# Patient Record
Sex: Female | Born: 1940 | Race: White | Hispanic: No | Marital: Single | Smoking: Never smoker
Health system: Southern US, Community
[De-identification: ages and names within clinical notes are randomized; demographics above are authoritative.]

## PROBLEM LIST (undated history)

## (undated) DIAGNOSIS — I10 Essential (primary) hypertension: Secondary | ICD-10-CM

## (undated) DIAGNOSIS — N059 Unspecified nephritic syndrome with unspecified morphologic changes: Secondary | ICD-10-CM

## (undated) DIAGNOSIS — F32A Depression, unspecified: Secondary | ICD-10-CM

## (undated) DIAGNOSIS — K317 Polyp of stomach and duodenum: Secondary | ICD-10-CM

## (undated) DIAGNOSIS — N186 End stage renal disease: Secondary | ICD-10-CM

## (undated) DIAGNOSIS — R0600 Dyspnea, unspecified: Secondary | ICD-10-CM

## (undated) DIAGNOSIS — N184 Chronic kidney disease, stage 4 (severe): Secondary | ICD-10-CM

## (undated) DIAGNOSIS — M109 Gout, unspecified: Secondary | ICD-10-CM

## (undated) DIAGNOSIS — E782 Mixed hyperlipidemia: Secondary | ICD-10-CM

## (undated) DIAGNOSIS — S300XXA Contusion of lower back and pelvis, initial encounter: Secondary | ICD-10-CM

## (undated) DIAGNOSIS — K219 Gastro-esophageal reflux disease without esophagitis: Secondary | ICD-10-CM

## (undated) DIAGNOSIS — Z992 Dependence on renal dialysis: Secondary | ICD-10-CM

## (undated) DIAGNOSIS — D631 Anemia in chronic kidney disease: Secondary | ICD-10-CM

## (undated) DIAGNOSIS — F329 Major depressive disorder, single episode, unspecified: Secondary | ICD-10-CM

## (undated) DIAGNOSIS — M479 Spondylosis, unspecified: Secondary | ICD-10-CM

## (undated) DIAGNOSIS — K3184 Gastroparesis: Secondary | ICD-10-CM

## (undated) DIAGNOSIS — N289 Disorder of kidney and ureter, unspecified: Secondary | ICD-10-CM

## (undated) DIAGNOSIS — I509 Heart failure, unspecified: Secondary | ICD-10-CM

## (undated) HISTORY — DX: Chronic kidney disease, stage 4 (severe): N18.4

## (undated) HISTORY — PX: CATARACT EXTRACTION: SUR2

## (undated) HISTORY — DX: Unspecified nephritic syndrome with unspecified morphologic changes: N05.9

## (undated) HISTORY — PX: TUBAL LIGATION: SHX77

## (undated) HISTORY — PX: APPENDECTOMY: SHX54

## (undated) HISTORY — DX: Depression, unspecified: F32.A

## (undated) HISTORY — DX: Essential (primary) hypertension: I10

## (undated) HISTORY — DX: Anemia in chronic kidney disease: D63.1

## (undated) HISTORY — DX: Gastro-esophageal reflux disease without esophagitis: K21.9

## (undated) HISTORY — DX: Mixed hyperlipidemia: E78.2

## (undated) HISTORY — DX: Major depressive disorder, single episode, unspecified: F32.9

---

## 2008-01-14 ENCOUNTER — Encounter: Admission: RE | Admit: 2008-01-14 | Discharge: 2008-01-14 | Payer: Self-pay | Admitting: Obstetrics and Gynecology

## 2008-08-12 ENCOUNTER — Encounter: Admission: RE | Admit: 2008-08-12 | Discharge: 2008-08-12 | Payer: Self-pay | Admitting: Obstetrics and Gynecology

## 2009-03-03 ENCOUNTER — Encounter: Admission: RE | Admit: 2009-03-03 | Discharge: 2009-03-03 | Payer: Self-pay | Admitting: Obstetrics and Gynecology

## 2010-02-01 ENCOUNTER — Encounter: Admission: RE | Admit: 2010-02-01 | Discharge: 2010-02-01 | Payer: Self-pay | Admitting: Obstetrics and Gynecology

## 2011-09-19 DIAGNOSIS — I10 Essential (primary) hypertension: Secondary | ICD-10-CM | POA: Insufficient documentation

## 2011-09-19 DIAGNOSIS — N059 Unspecified nephritic syndrome with unspecified morphologic changes: Secondary | ICD-10-CM | POA: Insufficient documentation

## 2011-10-01 DIAGNOSIS — F32A Depression, unspecified: Secondary | ICD-10-CM | POA: Insufficient documentation

## 2011-10-01 DIAGNOSIS — E782 Mixed hyperlipidemia: Secondary | ICD-10-CM | POA: Insufficient documentation

## 2012-07-30 DIAGNOSIS — M109 Gout, unspecified: Secondary | ICD-10-CM | POA: Insufficient documentation

## 2014-01-11 ENCOUNTER — Telehealth: Payer: Self-pay | Admitting: *Deleted

## 2014-01-11 ENCOUNTER — Encounter (HOSPITAL_COMMUNITY): Payer: Self-pay | Admitting: Emergency Medicine

## 2014-01-11 ENCOUNTER — Emergency Department (HOSPITAL_COMMUNITY): Payer: Medicare Other

## 2014-01-11 ENCOUNTER — Emergency Department (HOSPITAL_COMMUNITY)
Admission: EM | Admit: 2014-01-11 | Discharge: 2014-01-11 | Disposition: A | Discharge status: Home / Self Care | Payer: Medicare Other | Attending: Emergency Medicine | Admitting: Emergency Medicine

## 2014-01-11 DIAGNOSIS — I10 Essential (primary) hypertension: Secondary | ICD-10-CM | POA: Insufficient documentation

## 2014-01-11 DIAGNOSIS — R5381 Other malaise: Secondary | ICD-10-CM | POA: Insufficient documentation

## 2014-01-11 DIAGNOSIS — R109 Unspecified abdominal pain: Secondary | ICD-10-CM | POA: Insufficient documentation

## 2014-01-11 DIAGNOSIS — M7989 Other specified soft tissue disorders: Secondary | ICD-10-CM | POA: Insufficient documentation

## 2014-01-11 DIAGNOSIS — R55 Syncope and collapse: Secondary | ICD-10-CM | POA: Insufficient documentation

## 2014-01-11 DIAGNOSIS — Z8639 Personal history of other endocrine, nutritional and metabolic disease: Secondary | ICD-10-CM | POA: Insufficient documentation

## 2014-01-11 DIAGNOSIS — R0602 Shortness of breath: Secondary | ICD-10-CM | POA: Insufficient documentation

## 2014-01-11 DIAGNOSIS — Z862 Personal history of diseases of the blood and blood-forming organs and certain disorders involving the immune mechanism: Secondary | ICD-10-CM | POA: Insufficient documentation

## 2014-01-11 DIAGNOSIS — N390 Urinary tract infection, site not specified: Secondary | ICD-10-CM | POA: Insufficient documentation

## 2014-01-11 DIAGNOSIS — R5383 Other fatigue: Secondary | ICD-10-CM

## 2014-01-11 DIAGNOSIS — Z79899 Other long term (current) drug therapy: Secondary | ICD-10-CM | POA: Insufficient documentation

## 2014-01-11 DIAGNOSIS — Z792 Long term (current) use of antibiotics: Secondary | ICD-10-CM | POA: Insufficient documentation

## 2014-01-11 HISTORY — DX: Gout, unspecified: M10.9

## 2014-01-11 LAB — HEPATIC FUNCTION PANEL
ALT: 21 U/L (ref 0–35)
AST: 25 U/L (ref 0–37)
Albumin: 3.7 g/dL (ref 3.5–5.2)
Alkaline Phosphatase: 66 U/L (ref 39–117)
Bilirubin, Direct: <0.2 mg/dL (ref 0.0–0.3)
Total Bilirubin: 0.3 mg/dL (ref 0.3–1.2)
Total Protein: 7.3 g/dL (ref 6.0–8.3)

## 2014-01-11 LAB — LIPASE, BLOOD: Lipase: 179 U/L — ABNORMAL HIGH (ref 11–59)

## 2014-01-11 LAB — BASIC METABOLIC PANEL
BUN: 43 mg/dL — ABNORMAL HIGH (ref 6–23)
CO2: 22 mEq/L (ref 19–32)
Calcium: 9.4 mg/dL (ref 8.4–10.5)
Chloride: 100 mEq/L (ref 96–112)
Creatinine, Ser: 1.86 mg/dL — ABNORMAL HIGH (ref 0.50–1.10)
GFR calc Af Amer: 30 mL/min — ABNORMAL LOW (ref >90)
GFR calc non Af Amer: 26 mL/min — ABNORMAL LOW (ref >90)
Glucose, Bld: 96 mg/dL (ref 70–99)
Potassium: 4.6 mEq/L (ref 3.7–5.3)
Sodium: 136 mEq/L — ABNORMAL LOW (ref 137–147)

## 2014-01-11 LAB — CBC
HCT: 33.8 % — ABNORMAL LOW (ref 36.0–46.0)
Hemoglobin: 11.6 g/dL — ABNORMAL LOW (ref 12.0–15.0)
MCH: 31.5 pg (ref 26.0–34.0)
MCHC: 34.3 g/dL (ref 30.0–36.0)
MCV: 91.8 fL (ref 78.0–100.0)
Platelets: 293 10*3/uL (ref 150–400)
RBC: 3.68 MIL/uL — ABNORMAL LOW (ref 3.87–5.11)
RDW: 13.6 % (ref 11.5–15.5)
WBC: 6.9 10*3/uL (ref 4.0–10.5)

## 2014-01-11 LAB — URINALYSIS, ROUTINE W REFLEX MICROSCOPIC
Bilirubin Urine: NEGATIVE
Glucose, UA: NEGATIVE mg/dL
Ketones, ur: NEGATIVE mg/dL
Nitrite: POSITIVE — AB
Protein, ur: NEGATIVE mg/dL
Specific Gravity, Urine: 1.01 (ref 1.005–1.030)
Urobilinogen, UA: 0.2 mg/dL (ref 0.0–1.0)
pH: 5.5 (ref 5.0–8.0)

## 2014-01-11 LAB — URINE MICROSCOPIC-ADD ON

## 2014-01-11 LAB — TROPONIN I: Troponin I: <0.3 ng/mL (ref <0.30)

## 2014-01-11 LAB — PRO B NATRIURETIC PEPTIDE: Pro B Natriuretic peptide (BNP): 55.8 pg/mL (ref 0–125)

## 2014-01-11 MED ORDER — CEPHALEXIN 500 MG PO CAPS: 500.0000 mg | ORAL_CAPSULE | Freq: Four times a day (QID) | ORAL | Status: DC

## 2014-01-11 MED ORDER — DEXTROSE 5 % IV SOLN
1.0000 g | Freq: Once | INTRAVENOUS | Status: AC
  Administered 2014-01-11: 1 g via INTRAVENOUS
  Filled 2014-01-11: qty 10

## 2014-01-11 MED ORDER — IOHEXOL 300 MG/ML  SOLN
50.0000 mL | Freq: Once | INTRAMUSCULAR | Status: AC | PRN
  Administered 2014-01-11: 50 mL via ORAL

## 2014-01-11 MED ORDER — SODIUM CHLORIDE 0.9 % IV BOLUS (SEPSIS)
250.0000 mL | Freq: Once | INTRAVENOUS | Status: AC
  Administered 2014-01-11: 250 mL via INTRAVENOUS

## 2014-01-11 MED ORDER — SODIUM CHLORIDE 0.9 % IV SOLN
INTRAVENOUS | Status: DC
  Administered 2014-01-11: 18:00:00 via INTRAVENOUS

## 2014-01-11 MED ORDER — ONDANSETRON HCL 4 MG/2ML IJ SOLN
4.0000 mg | Freq: Once | INTRAMUSCULAR | Status: AC
  Administered 2014-01-11: 4 mg via INTRAVENOUS
  Filled 2014-01-11: qty 2

## 2014-01-11 NOTE — Discharge Instructions (Signed)
As we discussed followup with your regular Dr. in the next few days,  about your blood pressure readings. Perhaps a 1 and make an adjustment. The only significant finding on her workup here today was a urinary tract infection. He also had an elevation in the lipase but CT scan without any abnormal findings around the pancreas. Also no significant Donald pain there is a do not think this is pancreatitis. Return for any new or worse symptoms. Take the antibiotic for the urinary tract infection as directed. Urine culture was done that we'll take a couple days to get an official read on. Would also talk to your Dr. about sure thyroid tests.

## 2014-01-11 NOTE — ED Notes (Addendum)
Pt reports weakness since mother's day. Pt reports bp had been running low and has been taken off metoprolol. Pt reports ever since has been feeling weak. Pt denies any n/v, pain. Pt reports dizziness and lightheaded episodes a few weeks ago.

## 2014-01-11 NOTE — Telephone Encounter (Signed)
Pt called stating her BP is dropping. BP is 99/54 today and yesterday was 100/43, pulse is running around 60-80.Pt is weak and tired and states she could not get out of bed yesterday. This nurse asked what her normal BP runs. Pt states since she has kidney problems it runs a lot higher, 130s or higher. This nurse made the pt aware to go to the ER following her symptoms. I am not quit sure what her medications are and we do not have any notes on pt yet due to not having an appt until July 7 in our office.

## 2014-01-11 NOTE — Telephone Encounter (Signed)
Ok. Would not be able to provide recommendations until I assess her in the office.

## 2014-01-11 NOTE — ED Provider Notes (Signed)
CSN: [id number]     Arrival date & time 01/11/14  1238 History   First MD Initiated Contact with Patient 01/11/14 1508     Chief Complaint  Patient presents with  . Weakness     (Consider location/radiation/quality/duration/timing/severity/associated sxs/prior Treatment) Patient is a 73 y.o. female presenting with weakness. The history is provided by the patient.  Weakness Associated symptoms include abdominal pain and shortness of breath. Pertinent negatives include no chest pain and no headaches.   outpatient with primary care Dr. also followed by nephrology at [facility]. Recent adjustments in her blood pressure medicines because her blood pressure has been running lower than normal. Recently stopped her beta blocker and changed to Coreg also 1 week ago was 2 weeks ago one week ago had her hydrochlorothiazide stopped. Speech is still having low blood pressures [id number] systolic feeling fatigued and weak tired all the time. Some shortness of breath no chest pain no fever or chills has had some lower extremity edema but none now. Had several near syncopal episodes but no syncope. Has some epigastric abdominal pain all day today. It is not severe pain. Patient did note maybe some mild dysuria today.  Past Medical History  Diagnosis Date  . Renal disorder   . Hypertension   . Gout    Past Surgical History  Procedure Laterality Date  . Tubal ligation    . Appendectomy     History reviewed. No pertinent family history. History  Substance Use Topics  . Smoking status: Never Smoker   . Smokeless tobacco: Not on file  . Alcohol Use: No   OB History   Grav Para Term Preterm Abortions TAB SAB Ect Mult Living                 Review of Systems  Constitutional: Positive for activity change, appetite change and fatigue.  HENT: Negative for congestion.   Eyes: Negative for visual disturbance.  Respiratory: Positive for shortness of breath. Negative for cough.   Cardiovascular: Positive for  leg swelling. Negative for chest pain.  Gastrointestinal: Positive for abdominal pain.  Genitourinary: Positive for dysuria.  Musculoskeletal: Negative for back pain.  Skin: Negative for rash.  Neurological: Negative for dizziness, speech difficulty, weakness, numbness and headaches.  Hematological: Does not bruise/bleed easily.  Psychiatric/Behavioral: Negative for confusion.      Allergies  Sulfa antibiotics  Home Medications   Prior to Admission medications   Medication Sig Start Date End Date Taking? Authorizing Provider  allopurinol (ZYLOPRIM) 300 MG tablet Take 300 mg by mouth daily.   Yes Historical Provider, MD  b complex vitamins tablet Take 1 tablet by mouth daily.   Yes Historical Provider, MD  carvedilol (COREG) 6.25 MG tablet Take 6.25 mg by mouth 2 (two) times daily with a meal.   Yes Historical Provider, MD  Cholecalciferol (VITAMIN D PO) Take 1 tablet by mouth daily.   Yes Historical Provider, MD  citalopram (CELEXA) 10 MG tablet Take 10 mg by mouth at bedtime.   Yes Historical Provider, MD  clidinium-chlordiazePOXIDE (LIBRAX) 5-2.5 MG per capsule Take 1 capsule by mouth 4 (four) times daily.   Yes Historical Provider, MD  ezetimibe (ZETIA) 10 MG tablet Take 10 mg by mouth daily.   Yes Historical Provider, MD  losartan (COZAAR) 100 MG tablet Take 100 mg by mouth daily.   Yes Historical Provider, MD  simvastatin (ZOCOR) 40 MG tablet Take 40 mg by mouth daily.   Yes Historical Provider, MD  cephALEXin (KEFLEX) 500  MG capsule Take 1 capsule (500 mg total) by mouth 4 (four) times daily. 01/11/14   Fredia Sorrow, MD   BP 118/61  Pulse 58  Temp(Src) 97.9 F (36.6 C) (Oral)  Resp 15  Ht 5' (1.524 m)  Wt 126 lb (57.153 kg)  BMI 24.61 kg/m2  SpO2 100% Physical Exam  Nursing note and vitals reviewed. Constitutional: She is oriented to person, place, and time. She appears well-developed and well-nourished. No distress.  HENT:  Head: Normocephalic.  Mouth/Throat:  Oropharynx is clear and moist.  Eyes: Conjunctivae and EOM are normal. Pupils are equal, round, and reactive to light.  Neck: Normal range of motion. Neck supple.  Cardiovascular: Normal rate, regular rhythm and normal heart sounds.   No murmur heard. Pulmonary/Chest: Effort normal and breath sounds normal. No respiratory distress. She exhibits no tenderness.  Abdominal: Soft. Bowel sounds are normal. There is no tenderness.  Musculoskeletal: Normal range of motion. She exhibits no edema.  Neurological: She is alert and oriented to person, place, and time. No cranial nerve deficit. She exhibits normal muscle tone. Coordination normal.  Skin: Skin is warm. No rash noted.    ED Course  Procedures (including critical care time) Labs Review Labs Reviewed  CBC - Abnormal; Notable for the following:    RBC 3.68 (*)    Hemoglobin 11.6 (*)    HCT 33.8 (*)    All other components within normal limits  BASIC METABOLIC PANEL - Abnormal; Notable for the following:    Sodium 136 (*)    BUN 43 (*)    Creatinine, Ser 1.86 (*)    GFR calc non Af Amer 26 (*)    GFR calc Af Amer 30 (*)    All other components within normal limits  LIPASE, BLOOD - Abnormal; Notable for the following:    Lipase 179 (*)    All other components within normal limits  URINALYSIS, ROUTINE W REFLEX MICROSCOPIC - Abnormal; Notable for the following:    Hgb urine dipstick MODERATE (*)    Nitrite POSITIVE (*)    Leukocytes, UA SMALL (*)    All other components within normal limits  URINE MICROSCOPIC-ADD ON - Abnormal; Notable for the following:    Squamous Epithelial / LPF FEW (*)    Bacteria, UA MANY (*)    All other components within normal limits  URINE CULTURE  HEPATIC FUNCTION PANEL  TROPONIN I  PRO B NATRIURETIC PEPTIDE   Results for orders placed during the hospital encounter of 01/11/14  CBC      Result Value Ref Range   WBC 6.9  4.0 - 10.5 K/uL   RBC 3.68 (*) 3.87 - 5.11 MIL/uL   Hemoglobin 11.6 (*)  12.0 - 15.0 g/dL   HCT 33.8 (*) 36.0 - 46.0 %   MCV 91.8  78.0 - 100.0 fL   MCH 31.5  26.0 - 34.0 pg   MCHC 34.3  30.0 - 36.0 g/dL   RDW 13.6  11.5 - 15.5 %   Platelets 293  150 - 400 K/uL  BASIC METABOLIC PANEL      Result Value Ref Range   Sodium 136 (*) 137 - 147 mEq/L   Potassium 4.6  3.7 - 5.3 mEq/L   Chloride 100  96 - 112 mEq/L   CO2 22  19 - 32 mEq/L   Glucose, Bld 96  70 - 99 mg/dL   BUN 43 (*) 6 - 23 mg/dL   Creatinine, Ser 1.86 (*) 0.50 -  1.10 mg/dL   Calcium 9.4  8.4 - 10.5 mg/dL   GFR calc non Af Amer 26 (*) >90 mL/min   GFR calc Af Amer 30 (*) >90 mL/min  LIPASE, BLOOD      Result Value Ref Range   Lipase 179 (*) 11 - 59 U/L  HEPATIC FUNCTION PANEL      Result Value Ref Range   Total Protein 7.3  6.0 - 8.3 g/dL   Albumin 3.7  3.5 - 5.2 g/dL   AST 25  0 - 37 U/L   ALT 21  0 - 35 U/L   Alkaline Phosphatase 66  39 - 117 U/L   Total Bilirubin 0.3  0.3 - 1.2 mg/dL   Bilirubin, Direct <0.2  0.0 - 0.3 mg/dL   Indirect Bilirubin NOT CALCULATED  0.3 - 0.9 mg/dL  URINALYSIS, ROUTINE W REFLEX MICROSCOPIC      Result Value Ref Range   Color, Urine YELLOW  YELLOW   APPearance CLEAR  CLEAR   Specific Gravity, Urine 1.010  1.005 - 1.030   pH 5.5  5.0 - 8.0   Glucose, UA NEGATIVE  NEGATIVE mg/dL   Hgb urine dipstick MODERATE (*) NEGATIVE   Bilirubin Urine NEGATIVE  NEGATIVE   Ketones, ur NEGATIVE  NEGATIVE mg/dL   Protein, ur NEGATIVE  NEGATIVE mg/dL   Urobilinogen, UA 0.2  0.0 - 1.0 mg/dL   Nitrite POSITIVE (*) NEGATIVE   Leukocytes, UA SMALL (*) NEGATIVE  TROPONIN I      Result Value Ref Range   Troponin I <0.30  <0.30 ng/mL  PRO B NATRIURETIC PEPTIDE      Result Value Ref Range   Pro B Natriuretic peptide (BNP) 55.8  0 - 125 pg/mL  URINE MICROSCOPIC-ADD ON      Result Value Ref Range   Squamous Epithelial / LPF FEW (*) RARE   WBC, UA 21-50  <3 WBC/hpf   RBC / HPF 0-2  <3 RBC/hpf   Bacteria, UA MANY (*) RARE     Imaging Review Ct Abdomen Pelvis Wo  Contrast  01/11/2014   CLINICAL DATA:  Weakness  EXAM: CT ABDOMEN AND PELVIS WITHOUT CONTRAST  TECHNIQUE: Multidetector CT imaging of the abdomen and pelvis was performed following the standard protocol without IV contrast.  COMPARISON:  None.  FINDINGS: Irregular pleural thickening noted at the bilateral lung bases. The visualized lung bases are otherwise clear.  Limited noncontrast evaluation liver is unremarkable. Gallbladder within normal limits. No biliary ductal dilatation. The spleen, adrenal glands, and pancreas demonstrate a normal unenhanced appearance.  The kidneys are equal in size without evidence of nephrolithiasis or hydronephrosis.  Stomach within normal limits. No evidence of bowel obstruction. No acute inflammatory changes seen about the bowels. Appendix not definite visualize, however, no inflammatory changes seen about the cecum or in the right lower quadrant to suggest acute appendicitis.  Bladder is unremarkable. Uterus and ovaries within normal limits for patient age.  No free air or fluid. Intra-abdominal aorta is of normal caliber. Scattered collateral veins noted within the subcutaneous fat of the lower pelvis anteriorly.  No acute osseous abnormality. No worrisome lytic or blastic osseous lesions.  IMPRESSION: No CT evidence of acute intra-abdominal pelvic process.   Electronically Signed   By: Jeannine Boga M.D.   On: 01/11/2014 20:00   Dg Chest 2 View  01/11/2014   CLINICAL DATA:  Weakness and fatigue.  EXAM: CHEST  2 VIEW  COMPARISON:  None.  FINDINGS: Mediastinum and hilar  structures normal. Lungs are clear of acute infiltrates. Subsegmental atelectasis and or scarring left lung base. Mild cardiomegaly with normal pulmonary vascularity. No acute bony abnormality.  IMPRESSION: Mild left base subsegmental atelectasis and/or scarring . No acute cardiopulmonary disease.   Electronically Signed   By: Marcello Moores  Register   On: 01/11/2014 16:46     EKG  Interpretation   Date/Time:  Monday January 11 2014 12:52:19 EDT Ventricular Rate:  64 PR Interval:  148 QRS Duration: 84 QT Interval:  440 QTC Calculation: 453 R Axis:   -34 Text Interpretation:  Normal sinus rhythm Left axis deviation Low voltage  QRS Cannot rule out Anterior infarct , age undetermined Abnormal ECG  Confirmed by Trayvon Trumbull  MD, Hser Belanger [phone]) on 01/11/2014 3:41:21 PM      MDM   Final diagnoses:  UTI (lower urinary tract infection)  Other fatigue  Near syncope    Patient with about two-week history of fatigue some near syncopal episodes but no syncope blood pressures been running on the low side but always above 90 occasionally 99 systolic or [id number] systolic. Patient's had adjustment in her blood pressure medicines over the last 3-4 weeks. And hydrochlorothiazide stopped her beta blocker stopped and started on Coreg. Patient's symptoms have not changed. Patient is followed by Dr. Melina Copa locally. 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  Patient's workup here without without any significant cardiac findings. Blood pressure here or of was stable and above [id number] systolic. As high as [id number] systolic. Patient's troponin negative no evidence of any acute cardiac event in the past several days. EKG without any acute findings. Chest x-ray negative for any acute cardiopulmonary disease. CT of the abdomen was negative and that was done due to the elevated lipase. No evidence of any abnormalities on the CAT scan no evidence of any mass around the pancreas. Patient's urinalysis was consistent with a urinary tract infection. Culture sent treated with Rocephin will be continued on Keflex. This was a significant finding not sure if that explains her symptoms for the past several weeks. Patient also did have a tick bite not sure whether they have been about a month ago. We'll treat the for the urinary tract infection of the patient followup with her primary care Dr. gave  her a log of her blood pressure readings they may want to make additional adjustments on her blood pressure. Also feel that they ought to verify the thyroid function is normal. That may have or even done.    Fredia Sorrow, MD 01/11/14 2117

## 2014-01-13 LAB — URINE CULTURE: Colony Count: >=100000

## 2014-01-14 ENCOUNTER — Telehealth (HOSPITAL_COMMUNITY): Payer: Self-pay

## 2014-01-14 NOTE — ED Notes (Signed)
Post ED Visit - Positive Culture Follow-up  Culture report reviewed by antimicrobial stewardship pharmacist: []  Wes [place], Pharm.D., BCPS []  Heide Guile, Pharm.D., BCPS []  Alycia Rossetti, Pharm.D., BCPS [x]  [place], Pharm.D., BCPS, AAHIVP []  Legrand Como, Pharm.D., BCPS, AAHIVP []  Juliene Pina, Pharm.D.  Positive urine culture Treated with cephalexin, organism sensitive to the same and no further patient follow-up is required at this time.  Felicia Williams 01/14/2014, 12:24 PM

## 2014-01-26 ENCOUNTER — Ambulatory Visit (INDEPENDENT_AMBULATORY_CARE_PROVIDER_SITE_OTHER): Payer: Medicare Other | Admitting: Cardiology

## 2014-01-26 ENCOUNTER — Encounter: Payer: Self-pay | Admitting: Cardiology

## 2014-01-26 VITALS — BP 138/62 | HR 58 | Ht 60.0 in | Wt 123.0 lb

## 2014-01-26 DIAGNOSIS — R5381 Other malaise: Secondary | ICD-10-CM

## 2014-01-26 DIAGNOSIS — R002 Palpitations: Secondary | ICD-10-CM | POA: Insufficient documentation

## 2014-01-26 DIAGNOSIS — F3289 Other specified depressive episodes: Secondary | ICD-10-CM

## 2014-01-26 DIAGNOSIS — F329 Major depressive disorder, single episode, unspecified: Secondary | ICD-10-CM

## 2014-01-26 DIAGNOSIS — F32A Depression, unspecified: Secondary | ICD-10-CM

## 2014-01-26 DIAGNOSIS — I10 Essential (primary) hypertension: Secondary | ICD-10-CM | POA: Insufficient documentation

## 2014-01-26 DIAGNOSIS — R5383 Other fatigue: Secondary | ICD-10-CM

## 2014-01-26 DIAGNOSIS — R0602 Shortness of breath: Secondary | ICD-10-CM

## 2014-01-26 NOTE — Assessment & Plan Note (Signed)
This has largely resolved. Suspect that it was related to discontinuation of metoprolol, now improved after initiation of Coreg. Recent ECG reviewed. At this point would recommend observation.

## 2014-01-26 NOTE — Progress Notes (Signed)
Clinical Summary [name] is a 73 y.o.female referred for cardiology consultation by Dr. Melina Copa. She has a history of hypertension and glomerulonephritis, followed by nephrology at [facility]. 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 Weeks later this had improved somewhat, however she stated that she was feeling very weak and tired, also feeling of anhedonia. She was seen by Dr. Melina Copa for these symptoms, was transitioned off of metoprolol without much improvement, actually began to feel palpitations with this, and was ultimately placed on Coreg. She was continued on Cozaar throughout, although hydrochlorothiazide was discontinued due to relatively low blood pressures which was unusual for her. She was diagnosed with a UTI with ER visit in June, although not clear that all these symptoms were related.   She does with that she still feels tired, however this week is starting to do better. 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 He states that she felt similar symptoms years ago when she was clinically depressed, and has in fact just recently been placed on Celexa by Dr. Melina Copa.  As far as prior cardiac evaluation, details are not clear, however the patient tells me that she she was seen years ago for "tachycardia." No clear history of a cardiomyopathy or arrhythmias. It does sound like she has been on beta blocker therapy long-term.  Recent lab work in June showed hemoglobin 11.6, platelets 293, potassium 4.6, BUN 43, creatinine 1.8, normal LFTs, normal troponin I, and normal pro-BNP. Urine culture was positive for Escherichia coli infection, urinalysis did not report any protein. ECG from June showed sinus rhythm with borderline low voltage and leftward axis.   Allergies  Allergen Reactions  . Sulfa  Antibiotics Other (See Comments)    Shut pt's kidneys down    Current Outpatient Prescriptions  Medication Sig Dispense Refill  . b complex vitamins tablet Take 1 tablet by mouth daily.      . carvedilol (COREG) 6.25 MG tablet Take 6.25 mg by mouth 2 (two) times daily with a meal.      . cephALEXin (KEFLEX) 500 MG capsule Take 1 capsule (500 mg total) by mouth 4 (four) times daily.  28 capsule  0  . Cholecalciferol (VITAMIN D PO) Take 1 tablet by mouth daily.      . citalopram (CELEXA) 10 MG tablet Take 10 mg by mouth at bedtime.      . clidinium-chlordiazePOXIDE (LIBRAX) 5-2.5 MG per capsule Take 1 capsule by mouth 4 (four) times daily.      Marland Kitchen losartan (COZAAR) 100 MG tablet Take 100 mg by mouth daily.       No current facility-administered medications for this visit.    Past Medical History  Diagnosis Date  . Essential hypertension, benign   . Gout   . Mixed hyperlipidemia   . Depression   . GERD (gastroesophageal reflux disease)   . Glomerulonephritis     Past Surgical History  Procedure Laterality Date  . Tubal ligation    . Appendectomy    . Cataract extraction      Family History  Problem Relation Age of Onset  . CAD Father   . Heart attack Father   . Diabetes Mellitus II Father   . Hypertension Father   . Lupus Brother     Social History Ms.  Deak reports that she has never smoked. She does not have any smokeless tobacco history on file. Ms. Mathes reports that she does not drink alcohol.  Review of Systems Palpitations and leg edema had improved. Starting to get somewhat better energy. Appetite is fair. No orthopnea or PND. No exertional chest pain. Other systems reviewed and negative except as outlined.  Physical Examination Filed Vitals:   01/26/14 1335  BP: 138/62  Pulse: 58   Filed Weights   01/26/14 1335  Weight: 123 lb (55.792 kg)   Normally nourished appearing woman, comfortable at rest.HEENT: Conjunctiva and lids normal, oropharynx  clear with moist mucosa. Neck: Supple, no elevated JVP or carotid bruits, no thyromegaly. Lungs: Clear to auscultation, nonlabored breathing at rest. Cardiac: Regular rate and rhythm, no S3 or significant systolic murmur, no pericardial rub. Abdomen: Soft, nontender, bowel sounds present. Extremities: No pitting edema, distal pulses 2+. Skin: Warm and dry. Musculoskeletal: No kyphosis. Neuropsychiatric: Alert and oriented x3, affect grossly appropriate.   Problem List and Plan   Palpitations This has largely resolved. Suspect that it was related to discontinuation of metoprolol, now improved after initiation of Coreg. Recent ECG reviewed. At this point would recommend observation.  Fatigue due to depression Possible diagnosis. Patient is recently on Celexa, starting to feel better. She does report a period of time when she had leg swelling and also relative shortness of breath with activity. Recent lab work is not suggestive of overt volume overload, blood pressure seems to be stabilizing as well. We will obtain an echocardiogram to formally assess cardiac structure and function. If no significant abnormalities are uncovered, no further cardiac workup anticipated at this point.  Essential hypertension, benign She is on reasonable medical regimen including Cozaar and Coreg. Agree with holding HCTZ with some low blood pressures noted and a bump in creatinine. She should keep close followup with Dr. Melina Copa and her nephrologist.    Satira Sark, M.D., F.A.C.C.

## 2014-01-26 NOTE — Patient Instructions (Signed)
Your physician recommends that you schedule a follow-up appointment in: to be determined after test    Your physician has requested that you have an echocardiogram. Echocardiography is a painless test that uses sound waves to create images of your heart. It provides your doctor with information about the size and shape of your heart and how well your heart's chambers and valves are working. This procedure takes approximately one hour. There are no restrictions for this procedure.    Your physician recommends that you continue on your current medications as directed. Please refer to the Current Medication list given to you today.    Thank you for choosing [facility] !

## 2014-01-26 NOTE — Assessment & Plan Note (Signed)
She is on reasonable medical regimen including Cozaar and Coreg. Agree with holding HCTZ with some low blood pressures noted and a bump in creatinine. She should keep close followup with Dr. Melina Copa and her nephrologist.

## 2014-01-26 NOTE — Assessment & Plan Note (Signed)
Possible diagnosis. Patient is recently on Celexa, starting to feel better. She does report a period of time when she had leg swelling and also relative shortness of breath with activity. Recent lab work is not suggestive of overt volume overload, blood pressure seems to be stabilizing as well. We will obtain an echocardiogram to formally assess cardiac structure and function. If no significant abnormalities are uncovered, no further cardiac workup anticipated at this point.

## 2014-01-29 ENCOUNTER — Ambulatory Visit (HOSPITAL_COMMUNITY)
Admission: RE | Admit: 2014-01-29 | Discharge: 2014-01-29 | Disposition: A | Discharge status: Home / Self Care | Payer: Medicare Other | Source: Ambulatory Visit | Attending: Cardiology | Admitting: Cardiology

## 2014-01-29 DIAGNOSIS — R0602 Shortness of breath: Secondary | ICD-10-CM

## 2014-01-29 DIAGNOSIS — R5383 Other fatigue: Secondary | ICD-10-CM

## 2014-01-29 DIAGNOSIS — I059 Rheumatic mitral valve disease, unspecified: Secondary | ICD-10-CM | POA: Insufficient documentation

## 2014-01-29 DIAGNOSIS — R609 Edema, unspecified: Secondary | ICD-10-CM | POA: Insufficient documentation

## 2014-01-29 DIAGNOSIS — R5381 Other malaise: Secondary | ICD-10-CM | POA: Insufficient documentation

## 2014-01-29 DIAGNOSIS — R002 Palpitations: Secondary | ICD-10-CM | POA: Insufficient documentation

## 2014-01-29 DIAGNOSIS — I10 Essential (primary) hypertension: Secondary | ICD-10-CM | POA: Insufficient documentation

## 2014-01-29 DIAGNOSIS — E785 Hyperlipidemia, unspecified: Secondary | ICD-10-CM | POA: Insufficient documentation

## 2014-01-29 NOTE — Progress Notes (Signed)
  Echocardiogram 2D Echocardiogram has been performed.  [place], [place] 01/29/2014, 3:06 PM

## 2015-07-13 ENCOUNTER — Other Ambulatory Visit (HOSPITAL_COMMUNITY): Payer: Self-pay | Admitting: *Deleted

## 2015-07-13 DIAGNOSIS — R1011 Right upper quadrant pain: Secondary | ICD-10-CM

## 2015-07-20 ENCOUNTER — Ambulatory Visit (HOSPITAL_COMMUNITY): Admission: RE | Admit: 2015-07-20 | Payer: Medicare HMO | Source: Ambulatory Visit

## 2015-07-22 ENCOUNTER — Ambulatory Visit (HOSPITAL_COMMUNITY)
Admission: RE | Admit: 2015-07-22 | Discharge: 2015-07-22 | Disposition: A | Discharge status: Home / Self Care | Payer: Medicare HMO | Source: Ambulatory Visit | Attending: *Deleted | Admitting: *Deleted

## 2015-07-22 DIAGNOSIS — R1011 Right upper quadrant pain: Secondary | ICD-10-CM

## 2015-09-06 ENCOUNTER — Encounter: Payer: Self-pay | Admitting: Cardiology

## 2015-09-06 ENCOUNTER — Ambulatory Visit (INDEPENDENT_AMBULATORY_CARE_PROVIDER_SITE_OTHER): Payer: Medicare Other | Admitting: Cardiology

## 2015-09-06 VITALS — BP 132/72 | HR 57 | Ht 60.0 in | Wt 123.0 lb

## 2015-09-06 DIAGNOSIS — R0602 Shortness of breath: Secondary | ICD-10-CM

## 2015-09-06 DIAGNOSIS — E782 Mixed hyperlipidemia: Secondary | ICD-10-CM

## 2015-09-06 DIAGNOSIS — I10 Essential (primary) hypertension: Secondary | ICD-10-CM | POA: Diagnosis not present

## 2015-09-06 DIAGNOSIS — R002 Palpitations: Secondary | ICD-10-CM | POA: Diagnosis not present

## 2015-09-06 DIAGNOSIS — R072 Precordial pain: Secondary | ICD-10-CM

## 2015-09-06 NOTE — Progress Notes (Signed)
Cardiology Office Note  Date: 09/06/2015   ID: Felicia Williams, DOB 1940-10-28, MRN [id number]  PCP: Octavio Graves, DO  Primary Cardiologist: Rozann Lesches, MD   Chief Complaint  Patient presents with  . Hypertension  . History of palpitations  . Chest Pain    History of Present Illness: Felicia Williams is a 75 y.o. female referred back to the office by Dr. Melina Copa for cardiac follow-up. I saw her in consultation in July 2015. I do not have complete records as yet. She states that she was hospitalized at [facility] in September of last year secondary to precordial pain characterized as progressive feeling of indigestion, also associated with dyspnea on exertion. She states that she was told that her heart "checked out OK," she recalls having an echocardiogram and also possibly some peripheral vascular studies, although she did not have a stress test. She does follow at that facility with nephrology due to history of glomerulonephritis and hypertension.  She states that she feels less symptoms of indigestion, however remains short of breath with activities. She was referred for an abdominal ultrasound by Dr. Melina Copa in December 2016 to assess for gallbladder disease, no acute abnormalities were found. She also had lab work in December 2016 which I reviewed below.  She reports problems with ankle edema, usually towards the end of the day. At this time she is not on a diuretic. I reviewed her medications, she is on both Coreg and Lopressor (?) as well as hydralazine, and Cozaar. I reviewed her home blood pressure checks, generally systolics been under the [id number] most days of the month.  She does not have any clearly documented history of CAD. She has not undergone any recent ischemic evaluation.  Past Medical History  Diagnosis Date  . Essential hypertension, benign   . Gout   . Mixed hyperlipidemia   . Depression   . GERD (gastroesophageal reflux disease)   . Glomerulonephritis      Past Surgical History  Procedure Laterality Date  . Tubal ligation    . Appendectomy    . Cataract extraction      Current Outpatient Prescriptions  Medication Sig Dispense Refill  . aspirin EC 81 MG tablet Take 81 mg by mouth daily.    . carvedilol (COREG) 12.5 MG tablet Take 12.5 mg by mouth 2 (two) times daily with a meal.    . ergocalciferol (VITAMIN D2) 50000 units capsule Take by mouth 2 (two) times a week. Take two times a week    . ferrous sulfate 325 (65 FE) MG tablet Take 325 mg by mouth daily with breakfast.    . hydrALAZINE (APRESOLINE) 25 MG tablet Take 25 mg by mouth 3 (three) times daily.    Marland Kitchen losartan (COZAAR) 100 MG tablet Take 100 mg by mouth daily.    . metoprolol tartrate (LOPRESSOR) 25 MG tablet Take 25 mg by mouth 2 (two) times daily.    . pantoprazole (PROTONIX) 40 MG tablet Take 40 mg by mouth daily.    . simvastatin (ZOCOR) 40 MG tablet Take 40 mg by mouth daily.     No current facility-administered medications for this visit.   Allergies:  Sulfa antibiotics   Social History: The patient  reports that she has never smoked. She does not have any smokeless tobacco history on file. She reports that she does not drink alcohol or use illicit drugs.   ROS:  Please see the history of present illness. Otherwise, complete review of systems is positive for  intermittent ankle edema, fatigue and dyspnea and exertion as outlined.  All other systems are reviewed and negative.   Physical Exam: VS:  BP 132/72 mmHg  Pulse 57  Ht 5' (1.524 m)  Wt 123 lb (55.792 kg)  BMI 24.02 kg/m2  SpO2 98%, BMI Body mass index is 24.02 kg/(m^2).  Wt Readings from Last 3 Encounters:  09/06/15 123 lb (55.792 kg)  01/26/14 123 lb (55.792 kg)  01/11/14 126 lb (57.153 kg)    General: Normally nourished appearing woman, appears comfortable at rest. HEENT: Conjunctiva and lids normal, oropharynx clear. Neck: Supple, no elevated JVP or carotid bruits, no thyromegaly. Lungs: Clear to  auscultation, nonlabored breathing at rest. Cardiac: Regular rate and rhythm, no S3 or significant systolic murmur, no pericardial rub. Abdomen: Soft, nontender, bowel sounds present, no guarding or rebound. Extremities: Trace ankle edema, distal pulses 2+. Skin: Warm and dry. Musculoskeletal: No kyphosis. Neuropsychiatric: Alert and oriented x3, affect grossly appropriate.  ECG: I personally reviewed the prior tracing from 01/12/2014 which showed normal sinus rhythm with leftward axis and low voltage as well as decreased R wave progression.  Recent Labwork:  December 2016: Hemoglobin 10.8, platelets 247, albumin 3.6, ALT 60, AST 51, BUN 29, creatinine 1.3, potassium 4.7, cholesterol 167, HDL 61, LDL 86, triglycerides 101  Other Studies Reviewed Today:  Echocardiogram 01/29/2014: Study Conclusions  - Left ventricle: The cavity size was normal. Wall thickness was increased in a pattern of mild LVH. Systolic function was normal. The estimated ejection fraction was in the range of 50% to 55%. Mild diastolic dysfunction noted, with normal filling pressures. Wall motion was normal; there were no regional wall motion abnormalities. - Mitral valve: There was mild regurgitation.  Abdominal US 07/22/2015: FINDINGS: Gallbladder: No gallstones or wall thickening visualized. No sonographic Murphy sign noted by sonographer.  Common bile duct: Diameter: Normal caliber, 4 mm  Liver: No focal lesion identified. Within normal limits in parenchymal echogenicity.  IVC: No abnormality visualized.  Pancreas: Visualized portion unremarkable.  Spleen: Size and appearance within normal limits.  Right Kidney: Length: 8.7 cm. Echogenicity within normal limits. No mass or hydronephrosis visualized.  Left Kidney: Length: 10.0 cm. Echogenicity within normal limits. No mass or hydronephrosis visualized.  Abdominal aorta: No aneurysm visualized.  Other findings:  None.  IMPRESSION: Negative.  Assessment and Plan:  1. Recurring epigastric and precordial discomfort as well as fatigue and dyspnea on exertion. She has had a negative workup for gallbladder disease, reportedly had a reassuring echocardiogram and vascular studies done at [facility] - I am requesting the records for review. She has not undergone any ischemic testing. With current symptoms and risk factors including hypertension and hyperlipidemia, we will refer her for an exercise Cardiolite. She will need to hold beta blocker for the test.  2. Essential hypertension with history of renal disease. I reviewed her current regimen. She is on both Coreg and Lopressor, I would suggest consolidating this to Coreg alone. Could also consider being placed on a diuretic such as either chlorthalidone or Aldactone as both anti-hypertensive and also treatment for intermittent ankle edema. She will follow-up with Dr. Melina Copa and her nephrologist.  3. Hyperlipidemia, on Zocor. Recent LDL 86. No changes were made.  4. History of palpitations, none reported recently. She has had no dizziness or syncope.  Current medicines were reviewed with the patient today.   Orders Placed This Encounter  Procedures  . NM Myocar Multi W/Spect W/Wall Motion / EF  . EKG 12-Lead  Disposition: FU with me in 6 months.   Signed, Satira Sark, MD, [facility] 09/06/2015 9:07 AM    [facility] at [facility] [address] [place], [place] [address] Phone: [phone]; Fax: [phone]

## 2015-09-06 NOTE — Patient Instructions (Addendum)
Medication Instructions:  Your physician recommends that you continue on your current medications as directed. Please refer to the Current Medication list given to you today.   Labwork: NONE  Testing/Procedures: Your physician has requested that you have en exercise stress myoview. For further information please visit [email or web address]. Please follow instruction sheet, as given.     Follow-Up: Your physician wants you to follow-up in: 6 MONTHS. You will receive a reminder letter in the mail two months in advance. If you don't receive a letter, please call our office to schedule the follow-up appointment.     Any Other Special Instructions Will Be Listed Below (If Applicable).  ON DAY OF STRESS TEST HOLD THE COREG & LOPRESSOR   If you need a refill on your cardiac medications before your next appointment, please call your pharmacy.

## 2015-09-12 ENCOUNTER — Encounter (HOSPITAL_COMMUNITY): Payer: Self-pay

## 2015-09-12 ENCOUNTER — Encounter (HOSPITAL_COMMUNITY)
Admission: RE | Admit: 2015-09-12 | Discharge: 2015-09-12 | Disposition: A | Discharge status: Home / Self Care | Payer: Medicare Other | Source: Ambulatory Visit | Attending: Cardiology | Admitting: Cardiology

## 2015-09-12 ENCOUNTER — Inpatient Hospital Stay (HOSPITAL_COMMUNITY): Admission: RE | Admit: 2015-09-12 | Payer: Medicare Other | Source: Ambulatory Visit

## 2015-09-12 DIAGNOSIS — I10 Essential (primary) hypertension: Secondary | ICD-10-CM | POA: Insufficient documentation

## 2015-09-12 DIAGNOSIS — R0602 Shortness of breath: Secondary | ICD-10-CM

## 2015-09-12 DIAGNOSIS — R072 Precordial pain: Secondary | ICD-10-CM | POA: Diagnosis present

## 2015-09-12 HISTORY — DX: Disorder of kidney and ureter, unspecified: N28.9

## 2015-09-12 LAB — NM MYOCAR MULTI W/SPECT W/WALL MOTION / EF
Estimated workload: 7 METS
Exercise duration (min): 4 min
Exercise duration (sec): 30 s
LV dias vol: 64 mL
LV sys vol: 20 mL
MPHR: 145 {beats}/min
Peak HR: 133 {beats}/min
Percent HR: 91 %
RATE: 0.35
RPE: 13
Rest HR: 48 {beats}/min
SDS: 0
SRS: 0
SSS: 0
TID: 0.93

## 2015-09-12 MED ORDER — TECHNETIUM TC 99M SESTAMIBI GENERIC - CARDIOLITE
30.0000 | Freq: Once | INTRAVENOUS | Status: AC | PRN
  Administered 2015-09-12: 30.5 via INTRAVENOUS

## 2015-09-12 MED ORDER — REGADENOSON 0.4 MG/5ML IV SOLN
INTRAVENOUS | Status: AC
  Filled 2015-09-12: qty 5

## 2015-09-12 MED ORDER — SODIUM CHLORIDE 0.9% FLUSH
INTRAVENOUS | Status: AC
  Administered 2015-09-12: 10 mL via INTRAVENOUS
  Filled 2015-09-12: qty 10

## 2015-09-12 MED ORDER — TECHNETIUM TC 99M SESTAMIBI - CARDIOLITE
10.0000 | Freq: Once | INTRAVENOUS | Status: AC | PRN
  Administered 2015-09-12: 09:00:00 9.4 via INTRAVENOUS

## 2015-09-27 ENCOUNTER — Encounter (INDEPENDENT_AMBULATORY_CARE_PROVIDER_SITE_OTHER): Payer: Self-pay | Admitting: *Deleted

## 2015-09-28 ENCOUNTER — Other Ambulatory Visit (INDEPENDENT_AMBULATORY_CARE_PROVIDER_SITE_OTHER): Payer: Self-pay | Admitting: *Deleted

## 2015-09-28 ENCOUNTER — Encounter (INDEPENDENT_AMBULATORY_CARE_PROVIDER_SITE_OTHER): Payer: Self-pay | Admitting: *Deleted

## 2015-09-28 DIAGNOSIS — Z1211 Encounter for screening for malignant neoplasm of colon: Secondary | ICD-10-CM

## 2015-11-23 ENCOUNTER — Encounter (INDEPENDENT_AMBULATORY_CARE_PROVIDER_SITE_OTHER): Payer: Self-pay | Admitting: *Deleted

## 2015-11-23 ENCOUNTER — Other Ambulatory Visit (INDEPENDENT_AMBULATORY_CARE_PROVIDER_SITE_OTHER): Payer: Self-pay | Admitting: *Deleted

## 2015-11-23 MED ORDER — PEG 3350-KCL-NA BICARB-NACL 420 G PO SOLR: 4000.0000 mL | Freq: Once | ORAL | Status: DC

## 2015-11-23 NOTE — Telephone Encounter (Signed)
Patient needs trilyte 

## 2015-11-29 ENCOUNTER — Telehealth (INDEPENDENT_AMBULATORY_CARE_PROVIDER_SITE_OTHER): Payer: Self-pay | Admitting: *Deleted

## 2015-11-29 NOTE — Telephone Encounter (Signed)
Referring MD/PCP: butler   Procedure: tcs  Reason/Indication:  screening  Has patient had this procedure before?  no  If so, when, by whom and where?    Is there a family history of colon cancer?  no  Who?  What age when diagnosed?    Is patient diabetic?   no      Does patient have prosthetic heart valve or mechanical valve?  no  Do you have a pacemaker?  no  Has patient ever had endocarditis? no  Has patient had joint replacement within last 12 months?  no  Does patient tend to be constipated or take laxatives? no  Does patient have a history of alcohol/drug use?  no  Is patient on Coumadin, Plavix and/or Aspirin? yes  Medications: asa 81 mg daily, hydralazine 25 mg 1/2 tab tid, iron 65 mg bid, carvedilol 12.5 mg bid, metoprolol 50 mg 1/2 tab bid, simvastatin 40 mg daily, losartan 100 mg daily, pantoprazole 40 mg prn, equate anti diarrhea prn  Allergies: sulfur drugs  Medication Adjustment: asa 2 days, iron 10 days  Procedure date & time: 12/28/15 at 815

## 2015-12-01 NOTE — Telephone Encounter (Signed)
agree

## 2015-12-28 ENCOUNTER — Encounter (HOSPITAL_COMMUNITY)
Admission: RE | Disposition: A | Discharge status: Home / Self Care | Payer: Self-pay | Source: Ambulatory Visit | Attending: Internal Medicine

## 2015-12-28 ENCOUNTER — Encounter (HOSPITAL_COMMUNITY): Payer: Self-pay | Admitting: *Deleted

## 2015-12-28 ENCOUNTER — Ambulatory Visit (HOSPITAL_COMMUNITY)
Admission: RE | Admit: 2015-12-28 | Discharge: 2015-12-28 | Disposition: A | Discharge status: Home / Self Care | Payer: Medicare Other | Source: Ambulatory Visit | Attending: Internal Medicine | Admitting: Internal Medicine

## 2015-12-28 DIAGNOSIS — Z79899 Other long term (current) drug therapy: Secondary | ICD-10-CM | POA: Insufficient documentation

## 2015-12-28 DIAGNOSIS — F329 Major depressive disorder, single episode, unspecified: Secondary | ICD-10-CM | POA: Insufficient documentation

## 2015-12-28 DIAGNOSIS — Z1211 Encounter for screening for malignant neoplasm of colon: Secondary | ICD-10-CM | POA: Diagnosis not present

## 2015-12-28 DIAGNOSIS — K219 Gastro-esophageal reflux disease without esophagitis: Secondary | ICD-10-CM | POA: Insufficient documentation

## 2015-12-28 DIAGNOSIS — E782 Mixed hyperlipidemia: Secondary | ICD-10-CM | POA: Diagnosis not present

## 2015-12-28 DIAGNOSIS — Z7982 Long term (current) use of aspirin: Secondary | ICD-10-CM | POA: Insufficient documentation

## 2015-12-28 DIAGNOSIS — N289 Disorder of kidney and ureter, unspecified: Secondary | ICD-10-CM | POA: Diagnosis not present

## 2015-12-28 DIAGNOSIS — I10 Essential (primary) hypertension: Secondary | ICD-10-CM | POA: Diagnosis not present

## 2015-12-28 HISTORY — PX: COLONOSCOPY: SHX5424

## 2015-12-28 SURGERY — COLONOSCOPY
Anesthesia: Moderate Sedation

## 2015-12-28 MED ORDER — MIDAZOLAM HCL 5 MG/5ML IJ SOLN
INTRAMUSCULAR | Status: AC
  Filled 2015-12-28: qty 10

## 2015-12-28 MED ORDER — STERILE WATER FOR IRRIGATION IR SOLN
Status: DC | PRN
  Administered 2015-12-28: 2.5 mL

## 2015-12-28 MED ORDER — MIDAZOLAM HCL 5 MG/5ML IJ SOLN
INTRAMUSCULAR | Status: DC | PRN
  Administered 2015-12-28: 1 mg via INTRAVENOUS
  Administered 2015-12-28: 2 mg via INTRAVENOUS
  Administered 2015-12-28: 1 mg via INTRAVENOUS

## 2015-12-28 MED ORDER — MEPERIDINE HCL 50 MG/ML IJ SOLN
INTRAMUSCULAR | Status: DC | PRN
  Administered 2015-12-28 (×2): 25 mg via INTRAVENOUS

## 2015-12-28 MED ORDER — SODIUM CHLORIDE 0.9 % IV SOLN
INTRAVENOUS | Status: DC
  Administered 2015-12-28: 1000 mL via INTRAVENOUS

## 2015-12-28 MED ORDER — MEPERIDINE HCL 50 MG/ML IJ SOLN
INTRAMUSCULAR | Status: AC
  Filled 2015-12-28: qty 1

## 2015-12-28 NOTE — Op Note (Signed)
[facility] Patient Name: Felicia Williams Procedure Date: 12/28/2015 8:14 AM MRN: [id number] Date of Birth: 1941/04/26 Attending MD: Hildred Laser , MD CSN: [id number] Age: 75 Admit Type: Outpatient Procedure:                Colonoscopy Indications:              Screening for colorectal malignant neoplasm Providers:                Hildred Laser, MD, Lurline Del, RN, Isabella Stalling,                            Technician Referring MD:             Octavio Graves, DO Medicines:                Meperidine 50 mg IV, Midazolam 4 mg IV Complications:            No immediate complications. Estimated Blood Loss:     Estimated blood loss: none. Procedure:                Pre-Anesthesia Assessment:                           - Prior to the procedure, a History and Physical                            was performed, and patient medications and                            allergies were reviewed. The patient's tolerance of                            previous anesthesia was also reviewed. The risks                            and benefits of the procedure and the sedation                            options and risks were discussed with the patient.                            All questions were answered, and informed consent                            was obtained. Prior Anticoagulants: The patient                            last took aspirin 7 days prior to the procedure.                            ASA Grade Assessment: III - A patient with severe                            systemic disease. After reviewing the risks and  benefits, the patient was deemed in satisfactory                            condition to undergo the procedure.                           After obtaining informed consent, the colonoscope                            was passed under direct vision. Throughout the                            procedure, the patient's blood pressure, pulse, and           oxygen saturations were monitored continuously. The                            [id number]) scope was introduced through                            the anus and advanced to the the cecum, identified                            by appendiceal orifice and ileocecal valve. The                            colonoscopy was performed without difficulty. The                            patient tolerated the procedure well. The quality                            of the bowel preparation was adequate to identify                            polyps. The ileocecal valve, appendiceal orifice,                            and rectum were photographed. Scope In: 8:34:33 AM Scope Out: 8:53:13 AM Scope Withdrawal Time: 0 hours 7 minutes 26 seconds  Total Procedure Duration: 0 hours 18 minutes 40 seconds  Findings:      The colon (entire examined portion) appeared normal.      The retroflexed view of the distal rectum and anal verge was normal and       showed no anal or rectal abnormalities. Impression:               - The entire examined colon is normal.                           - No specimens collected. Moderate Sedation:      Moderate (conscious) sedation was administered by the endoscopy nurse       and supervised by the endoscopist. The following parameters were       monitored: oxygen saturation, heart rate, blood pressure, CO2       capnography and  response to care. Total physician intraservice time was       24 minutes. Recommendation:           - Patient has a contact number available for                            emergencies. The signs and symptoms of potential                            delayed complications were discussed with the                            patient. Return to normal activities tomorrow.                            Written discharge instructions were provided to the                            patient.                           - Resume previous diet today.                            - Continue present medications.                           - Resume aspirin at prior dose today.                           - Repeat colonoscopy is not recommended per                            protocol. Procedure Code(s):        --- Professional ---                           [id number], Colonoscopy, flexible; diagnostic, including                            collection of specimen(s) by brushing or washing,                            when performed (separate procedure)                           99152, Moderate sedation services provided by the                            same physician or other qualified health care                            professional performing the diagnostic or                            therapeutic service that the sedation supports,  requiring the presence of an independent trained                            observer to assist in the monitoring of the                            patient's level of consciousness and physiological                            status; initial 15 minutes of intraservice time,                            patient age 31 years or older                           [phone], Moderate sedation services; each additional                            15 minutes intraservice time Diagnosis Code(s):        --- Professional ---                           Z12.11, Encounter for screening for malignant                            neoplasm of colon CPT copyright 2016 [employer]. All rights reserved. The codes documented in this report are preliminary and upon coder review may  be revised to meet current compliance requirements. Hildred Laser, MD Hildred Laser, MD 12/28/2015 8:59:30 AM This report has been signed electronically. Number of Addenda: 0

## 2015-12-28 NOTE — H&P (Signed)
Felicia Williams is an 75 y.o. female.   Chief Complaint: Patient is here for colonoscopy. HPI: She was 75 year old Caucasian female who is here for screening colonoscopy. She denies abdominal pain change in bowel habits or rectal bleeding. She has diarrhea when she eats sweets. She is able to controlled with Imodium. Lately her appetite has not been good is not sure if she's lost any weight. She had normal sigmoidoscopy 25 years ago. This is her first colonoscopy. Family history is negative for CRC.  Past Medical History  Diagnosis Date  . Essential hypertension, benign   . Gout   . Mixed hyperlipidemia   . Depression   . GERD (gastroesophageal reflux disease)   . Glomerulonephritis   . Renal insufficiency     Past Surgical History  Procedure Laterality Date  . Tubal ligation    . Appendectomy    . Cataract extraction      Family History  Problem Relation Age of Onset  . CAD Father   . Heart attack Father   . Diabetes Mellitus II Father   . Hypertension Father   . Lupus Brother    Social History:  reports that she has never smoked. She does not have any smokeless tobacco history on file. She reports that she does not drink alcohol or use illicit drugs.  Allergies:  Allergies  Allergen Reactions  . Sulfa Antibiotics Other (See Comments)    Shut pt's kidneys down    Medications Prior to Admission  Medication Sig Dispense Refill  . aspirin EC 81 MG tablet Take 81 mg by mouth daily.    . carvedilol (COREG) 12.5 MG tablet Take 12.5 mg by mouth 2 (two) times daily with a meal.    . ergocalciferol (VITAMIN D2) 50000 units capsule Take 50,000 Units by mouth 2 (two) times a week. Takes on Wednesdays and Sundays.    . ferrous sulfate 325 (65 FE) MG tablet Take 325 mg by mouth daily with breakfast.    . hydrALAZINE (APRESOLINE) 25 MG tablet Take 25 mg by mouth 3 (three) times daily.    Marland Kitchen losartan (COZAAR) 100 MG tablet Take 100 mg by mouth daily.    . polyethylene  glycol-electrolytes (NULYTELY/GOLYTELY) 420 g solution Take 4,000 mLs by mouth once. 4000 mL 0  . simvastatin (ZOCOR) 40 MG tablet Take 40 mg by mouth daily.      No results found for this or any previous visit (from the past 48 hour(s)). No results found.  ROS  Blood pressure 162/57, pulse 57, temperature 98.4 F (36.9 C), temperature source Oral, resp. rate 16, height 5' (1.524 m), weight 120 lb (54.432 kg), SpO2 98 %. Physical Exam  Constitutional: She appears well-developed and well-nourished.  HENT:  Mouth/Throat: Oropharynx is clear and moist.  Eyes: Conjunctivae are normal. No scleral icterus.  Neck: No thyromegaly present.  Cardiovascular: Normal rate, regular rhythm and normal heart sounds.   No murmur heard. GI: Soft. She exhibits no distension and no mass. There is no tenderness.  Musculoskeletal: She exhibits no edema.  Lymphadenopathy:    She has no cervical adenopathy.  Neurological: She is alert.  Skin: Skin is warm and dry.     Assessment/Plan Average risk screening colonoscopy.  Hildred Laser, MD 12/28/2015, 8:25 AM

## 2015-12-28 NOTE — Discharge Instructions (Signed)
Resume usual medications and diet. No driving for 24 hours.  Colonoscopy, Care After Refer to this sheet in the next few weeks. These instructions provide you with information on caring for yourself after your procedure. Your health care provider may also give you more specific instructions. Your treatment has been planned according to current medical practices, but problems sometimes occur. Call your health care provider if you have any problems or questions after your procedure. WHAT TO EXPECT AFTER THE PROCEDURE  After your procedure, it is typical to have the following:  A small amount of blood in your stool.  Moderate amounts of gas and mild abdominal cramping or bloating. HOME CARE INSTRUCTIONS  Do not drive, operate machinery, or sign important documents for 24 hours.  You may shower and resume your regular physical activities, but move at a slower pace for the first 24 hours.  Take frequent rest periods for the first 24 hours.  Walk around or put a warm pack on your abdomen to help reduce abdominal cramping and bloating.  Drink enough fluids to keep your urine clear or pale yellow.  You may resume your normal diet as instructed by your health care provider. Avoid heavy or fried foods that are hard to digest.  Avoid drinking alcohol for 24 hours or as instructed by your health care provider.  Only take over-the-counter or prescription medicines as directed by your health care provider.  If a tissue sample (biopsy) was taken during your procedure:  Do not take aspirin or blood thinners for 7 days, or as instructed by your health care provider.  Do not drink alcohol for 7 days, or as instructed by your health care provider.  Eat soft foods for the first 24 hours. SEEK MEDICAL CARE IF: You have persistent spotting of blood in your stool 2-3 days after the procedure. SEEK IMMEDIATE MEDICAL CARE IF:  You have more than a small spotting of blood in your stool.  You pass  large blood clots in your stool.  Your abdomen is swollen (distended).  You have nausea or vomiting.  You have a fever.  You have increasing abdominal pain that is not relieved with medicine.   This information is not intended to replace advice given to you by your health care provider. Make sure you discuss any questions you have with your health care provider.   Document Released: 02/21/2004 Document Revised: 04/29/2013 Document Reviewed: 03/16/2013 Elsevier Interactive Patient Education 2016 [employer]  PATIENT INSTRUCTIONS POST-ANESTHESIA  IMMEDIATELY FOLLOWING SURGERY:  Do not drive or operate machinery for the first twenty four hours after surgery.  Do not make any important decisions for twenty four hours after surgery or while taking narcotic pain medications or sedatives.  If you develop intractable nausea and vomiting or a severe headache please notify your doctor immediately.  FOLLOW-UP:  Please make an appointment with your surgeon as instructed. You do not need to follow up with anesthesia unless specifically instructed to do so.  WOUND CARE INSTRUCTIONS (if applicable):  Keep a dry clean dressing on the anesthesia/puncture wound site if there is drainage.  Once the wound has quit draining you may leave it open to air.  Generally you should leave the bandage intact for twenty four hours unless there is drainage.  If the epidural site drains for more than 36-48 hours please call the anesthesia department.  QUESTIONS?:  Please feel free to call your physician or the hospital operator if you have any questions, and they will be  happy to assist you.

## 2015-12-29 ENCOUNTER — Encounter (HOSPITAL_COMMUNITY): Payer: Self-pay | Admitting: Internal Medicine

## 2016-03-12 NOTE — Progress Notes (Signed)
Cardiology Office Note  Date: 03/13/2016   ID: Felicia Williams, DOB 1941-06-08, MRN [id number]  PCP: Octavio Graves, DO  Primary Cardiologist: Rozann Lesches, MD   Chief Complaint  Patient presents with  . Shortness of Breath  . Intermittent leg swelling    History of Present Illness: Felicia Williams is a 75 y.o. female last seen in February. She is referred back to the office by Dr. Melina Copa, I reviewed the recent office note. She complains of dyspnea on exertion, overall chronic but worse during the summer. She also had worsening leg edema back in July, generally improved at this time but with some mild puffiness of her ankles. She has been noted to be bradycardic on current therapy, heart rate looks to be fairly stable over time. She is not reporting any exertional chest pain, no dizziness or syncope.  Stress testing from earlier in the year is outlined below, overall low risk.   Past Medical History:  Diagnosis Date  . Depression   . Essential hypertension, benign   . GERD (gastroesophageal reflux disease)   . Glomerulonephritis   . Gout   . Mixed hyperlipidemia   . Renal insufficiency     Past Surgical History:  Procedure Laterality Date  . APPENDECTOMY    . CATARACT EXTRACTION    . COLONOSCOPY N/A 12/28/2015   Procedure: COLONOSCOPY;  Surgeon: Rogene Houston, MD;  Location: [facility];  Service: Endoscopy;  Laterality: N/A;  815  . TUBAL LIGATION      Current Outpatient Prescriptions  Medication Sig Dispense Refill  . aspirin EC 81 MG tablet Take 81 mg by mouth daily.    . ergocalciferol (VITAMIN D2) 50000 units capsule Take 50,000 Units by mouth 2 (two) times a week. Takes on Wednesdays and Sundays.    . ferrous sulfate 325 (65 FE) MG tablet Take 325 mg by mouth daily with breakfast.    . hydrALAZINE (APRESOLINE) 25 MG tablet Take 25 mg by mouth 3 (three) times daily.    Marland Kitchen losartan (COZAAR) 100 MG tablet Take 100 mg by mouth daily.    . simvastatin (ZOCOR)  40 MG tablet Take 40 mg by mouth daily.    . carvedilol (COREG) 6.25 MG tablet Take 1 tablet (6.25 mg total) by mouth 2 (two) times daily. 180 tablet 3   No current facility-administered medications for this visit.    Allergies:  Sulfa antibiotics   Social History: The patient  reports that she has never smoked. She has never used smokeless tobacco. She reports that she does not drink alcohol or use drugs.   ROS:  Please see the history of present illness. Otherwise, complete review of systems is positive for generalized fatigue.  All other systems are reviewed and negative.   Physical Exam: VS:  BP 138/62 (BP Location: Left Arm)   Pulse (!) 58   Ht 5' (1.524 m)   Wt 119 lb (54 kg)   SpO2 98%   BMI 23.24 kg/m , BMI Body mass index is 23.24 kg/m.  Wt Readings from Last 3 Encounters:  03/13/16 119 lb (54 kg)  12/28/15 120 lb (54.4 kg)  09/06/15 123 lb (55.8 kg)    General: Normally nourished appearing woman, appears comfortable at rest. HEENT: Conjunctiva and lids normal, oropharynx clear. Neck: Supple, no elevated JVP or carotid bruits, no thyromegaly. Lungs: Clear to auscultation, nonlabored breathing at rest. Cardiac: Regular rate and rhythm, no S3 or significant systolic murmur, no pericardial rub. Abdomen: Soft, nontender,  bowel sounds present, no guarding or rebound. Extremities: Trace ankle edema, distal pulses 2+. Skin: Warm and dry. Musculoskeletal: No kyphosis. Neuropsychiatric: Alert and oriented x3, affect grossly appropriate.  ECG: I personally reviewed the tracing from 09/06/2015 showed sinus bradycardia.  Recent Labwork:  December 2016: Hemoglobin 10.8, platelets 247, AST 51, ALT 60, BUN 29, creatinine 1.3, potassium 4.7, cholesterol 167, triglycerides 101, HDL 61, LDL 86  Other Studies Reviewed Today:  Echocardiogram 01/29/2014: Study Conclusions  - Left ventricle: The cavity size was normal. Wall thickness was increased in a pattern of mild LVH.  Systolic function was normal. The estimated ejection fraction was in the range of 50% to 55%. Mild diastolic dysfunction noted, with normal filling pressures. Wall motion was normal; there were no regional wall motion abnormalities. - Mitral valve: There was mild regurgitation.  Exercise Cardiolite 09/12/2015:  Blood pressure demonstrated a hypertensive response to exercise.  The study is normal.  This is a low risk study.  Nuclear stress EF: 69%.  Assessment and Plan:  1. Dyspnea on exertion with intermittent leg edema. She does not have any documented history of ischemic heart disease, and by last assessment her LVEF was normal range. I am recommending a follow-up echocardiogram to compare to the prior study from 2015. We will also cut her Coreg dose to 6.25 mg twice daily to see if she feels better, exclude any component of symptomatic bradycardia.  2. Essential hypertension. Concurrently on Cozaar and hydralazine. May need to adjust doses if blood pressure goes up with reduction in Coreg and we decide to stay on the lower dose.  3. Hyperlipidemia, on Zocor.  4. History of renal insufficiency. Creatinine was 1.3 in December 2016. She is not on a diuretic at this time.  Current medicines were reviewed with the patient today.   Orders Placed This Encounter  Procedures  . ECHOCARDIOGRAM COMPLETE    Disposition: Call with results and further recommendations.  Signed, Satira Sark, MD, [facility] 03/13/2016 1:42 PM    [facility] [address] [place], [place] [address] Phone: [phone]; Fax: [phone]

## 2016-03-13 ENCOUNTER — Encounter: Payer: Self-pay | Admitting: Cardiology

## 2016-03-13 ENCOUNTER — Ambulatory Visit (INDEPENDENT_AMBULATORY_CARE_PROVIDER_SITE_OTHER): Payer: Medicare Other | Admitting: Cardiology

## 2016-03-13 VITALS — BP 138/62 | HR 58 | Ht 60.0 in | Wt 119.0 lb

## 2016-03-13 DIAGNOSIS — R0602 Shortness of breath: Secondary | ICD-10-CM | POA: Diagnosis not present

## 2016-03-13 DIAGNOSIS — R001 Bradycardia, unspecified: Secondary | ICD-10-CM

## 2016-03-13 DIAGNOSIS — E785 Hyperlipidemia, unspecified: Secondary | ICD-10-CM

## 2016-03-13 DIAGNOSIS — I10 Essential (primary) hypertension: Secondary | ICD-10-CM | POA: Diagnosis not present

## 2016-03-13 MED ORDER — CARVEDILOL 6.25 MG PO TABS: 6.2500 mg | ORAL_TABLET | Freq: Two times a day (BID) | ORAL | 3 refills | Status: DC

## 2016-03-13 NOTE — Patient Instructions (Signed)
Your physician recommends that you schedule a follow-up appointment in: we will call you with the results   DECREASE Coreg to 6.25 mg twice a day   Your physician has requested that you have an echocardiogram. Echocardiography is a painless test that uses sound waves to create images of your heart. It provides your doctor with information about the size and shape of your heart and how well your heart's chambers and valves are working. This procedure takes approximately one hour. There are no restrictions for this procedure.    Thank you for choosing [facility] !

## 2016-03-22 ENCOUNTER — Ambulatory Visit (HOSPITAL_COMMUNITY)
Admission: RE | Admit: 2016-03-22 | Discharge: 2016-03-22 | Disposition: A | Discharge status: Home / Self Care | Payer: Medicare Other | Source: Ambulatory Visit | Attending: Cardiology | Admitting: Cardiology

## 2016-03-22 DIAGNOSIS — I351 Nonrheumatic aortic (valve) insufficiency: Secondary | ICD-10-CM | POA: Diagnosis not present

## 2016-03-22 DIAGNOSIS — R002 Palpitations: Secondary | ICD-10-CM | POA: Insufficient documentation

## 2016-03-22 DIAGNOSIS — R0602 Shortness of breath: Secondary | ICD-10-CM

## 2016-03-22 DIAGNOSIS — I119 Hypertensive heart disease without heart failure: Secondary | ICD-10-CM | POA: Insufficient documentation

## 2016-03-22 LAB — ECHOCARDIOGRAM COMPLETE
E decel time: 250 msec
E/e' ratio: 10.72
FS: 30 % (ref 28–44)
IVS/LV PW RATIO, ED: 1.1
LA ID, A-P, ES: 37 mm
LA diam end sys: 37 mm
LA diam index: 2.43 cm/m2
LA vol A4C: 45.8 ml
LA vol index: 31 mL/m2
LA vol: 47.1 mL
LV E/e' medial: 10.72
LV E/e'average: 10.72
LV PW d: 11 mm — AB (ref 0.6–1.1)
LV dias vol index: 39 mL/m2
LV dias vol: 60 mL (ref 46–106)
LV e' LATERAL: 8.81 cm/s
LV sys vol index: 17 mL/m2
LV sys vol: 26 mL (ref 14–42)
LVOT SV: 73 mL
LVOT VTI: 25.8 cm
LVOT area: 2.84 cm2
LVOT diameter: 19 mm
LVOT peak grad rest: 4 mmHg
LVOT peak vel: 98.8 cm/s
Lateral S' vel: 13.2 cm/s
MV Dec: 250
MV Peak grad: 4 mmHg
MV pk A vel: 82.9 m/s
MV pk E vel: 94.4 m/s
Simpson's disk: 57
Stroke v: 34 ml
TAPSE: 21.1 mm
TDI e' lateral: 8.81
TDI e' medial: 8.05

## 2016-03-22 NOTE — Progress Notes (Signed)
*  PRELIMINARY RESULTS* Echocardiogram 2D Echocardiogram has been performed.  Samuel Germany 03/22/2016, 1:41 PM

## 2016-07-18 ENCOUNTER — Encounter (HOSPITAL_COMMUNITY): Payer: Self-pay | Admitting: Emergency Medicine

## 2016-07-18 ENCOUNTER — Emergency Department (HOSPITAL_COMMUNITY): Payer: Medicare Other

## 2016-07-18 ENCOUNTER — Emergency Department (HOSPITAL_COMMUNITY)
Admission: EM | Admit: 2016-07-18 | Discharge: 2016-07-18 | Disposition: A | Discharge status: Home / Self Care | Payer: Medicare Other | Attending: Emergency Medicine | Admitting: Emergency Medicine

## 2016-07-18 DIAGNOSIS — Z7982 Long term (current) use of aspirin: Secondary | ICD-10-CM | POA: Insufficient documentation

## 2016-07-18 DIAGNOSIS — R079 Chest pain, unspecified: Secondary | ICD-10-CM

## 2016-07-18 DIAGNOSIS — Z79899 Other long term (current) drug therapy: Secondary | ICD-10-CM | POA: Insufficient documentation

## 2016-07-18 DIAGNOSIS — I10 Essential (primary) hypertension: Secondary | ICD-10-CM | POA: Insufficient documentation

## 2016-07-18 DIAGNOSIS — R0789 Other chest pain: Secondary | ICD-10-CM | POA: Insufficient documentation

## 2016-07-18 DIAGNOSIS — R0602 Shortness of breath: Secondary | ICD-10-CM | POA: Insufficient documentation

## 2016-07-18 DIAGNOSIS — M549 Dorsalgia, unspecified: Secondary | ICD-10-CM | POA: Insufficient documentation

## 2016-07-18 DIAGNOSIS — R1013 Epigastric pain: Secondary | ICD-10-CM | POA: Diagnosis not present

## 2016-07-18 LAB — CBC
HCT: 31.9 % — ABNORMAL LOW (ref 36.0–46.0)
Hemoglobin: 11.2 g/dL — ABNORMAL LOW (ref 12.0–15.0)
MCH: 32.7 pg (ref 26.0–34.0)
MCHC: 35.1 g/dL (ref 30.0–36.0)
MCV: 93.3 fL (ref 78.0–100.0)
Platelets: 227 10*3/uL (ref 150–400)
RBC: 3.42 MIL/uL — ABNORMAL LOW (ref 3.87–5.11)
RDW: 12.1 % (ref 11.5–15.5)
WBC: 6.8 10*3/uL (ref 4.0–10.5)

## 2016-07-18 LAB — BASIC METABOLIC PANEL
Anion gap: 6 (ref 5–15)
BUN: 36 mg/dL — ABNORMAL HIGH (ref 6–20)
CO2: 20 mmol/L — ABNORMAL LOW (ref 22–32)
Calcium: 8.6 mg/dL — ABNORMAL LOW (ref 8.9–10.3)
Chloride: 112 mmol/L — ABNORMAL HIGH (ref 101–111)
Creatinine, Ser: 1.33 mg/dL — ABNORMAL HIGH (ref 0.44–1.00)
GFR calc Af Amer: 44 mL/min — ABNORMAL LOW (ref >60)
GFR calc non Af Amer: 38 mL/min — ABNORMAL LOW (ref >60)
Glucose, Bld: 105 mg/dL — ABNORMAL HIGH (ref 65–99)
Potassium: 4.5 mmol/L (ref 3.5–5.1)
Sodium: 138 mmol/L (ref 135–145)

## 2016-07-18 LAB — TROPONIN I: Troponin I: <0.03 ng/mL (ref <0.03)

## 2016-07-18 MED ORDER — OMEPRAZOLE 20 MG PO CPDR: 20.0000 mg | DELAYED_RELEASE_CAPSULE | Freq: Two times a day (BID) | ORAL | 0 refills | Status: DC

## 2016-07-18 MED ORDER — GI COCKTAIL ~~LOC~~
30.0000 mL | Freq: Once | ORAL | Status: AC
  Administered 2016-07-18: 30 mL via ORAL
  Filled 2016-07-18: qty 30

## 2016-07-18 NOTE — ED Triage Notes (Signed)
Patient complains of chest pain that started last night. States BP at home was 190/100. States nausea and shortness of breath.  Pt states took 162 mg of aspirin last night.

## 2016-07-18 NOTE — ED Provider Notes (Signed)
[facility] DEPT Provider Note   CSN: [id number] Arrival date & time: 07/18/16  1219    By signing my name below, I, Macon Large, attest that this documentation has been prepared under the direction and in the presence of Virgel Manifold, MD. Electronically Signed: Macon Large, ED Scribe. 07/18/16. 8:37 AM.  History   Chief Complaint Chief Complaint  Patient presents with  . Chest Pain   The history is provided by the patient. No language interpreter was used.   HPI Comments: Yicel Shannon is a 75 y.o. female with PMHx of GERD and essential hypertension, who presents to the Emergency Department complaining of moderate, constant, CP in her epigastric region onset last night. Pt states she was watching television and notes it began hurting spontaneously. She describes it as a hard pain. Pt states it takes her breathe away at times. Pt notes pain is increased when pushing against the area. She states she was unable to have a meal this morning due to her pain. Pt states she has been belching since earlier today with no relief. She notes having similar pains in the affected area in the past, but states it has never been this unbearable. Per pt, she has a rapid heart beat and had a stress test done in 07/2014. She reports associated back pain, in between her shoulder blades. She notes this is her normal baseline. Pt also notes her blood pressure "bumping up". No alleviating factors noted. She reports being on medication for reflux but notes she has not been taking her medication for a year. She denies nausea.   Past Medical History:  Diagnosis Date  . Depression   . Essential hypertension, benign   . GERD (gastroesophageal reflux disease)   . Glomerulonephritis   . Gout   . Mixed hyperlipidemia   . Renal insufficiency     Patient Active Problem List   Diagnosis Date Noted  . Essential hypertension, benign 01/26/2014  . Palpitations 01/26/2014  . Fatigue due to depression  01/26/2014    Past Surgical History:  Procedure Laterality Date  . APPENDECTOMY    . CATARACT EXTRACTION    . COLONOSCOPY N/A 12/28/2015   Procedure: COLONOSCOPY;  Surgeon: Rogene Houston, MD;  Location: [facility];  Service: Endoscopy;  Laterality: N/A;  815  . TUBAL LIGATION      OB History    No data available       Home Medications    Prior to Admission medications   Medication Sig Start Date End Date Taking? Authorizing Provider  aspirin EC 81 MG tablet Take 81 mg by mouth daily.    Historical Provider, MD  carvedilol (COREG) 6.25 MG tablet Take 1 tablet (6.25 mg total) by mouth 2 (two) times daily. 03/13/16   Satira Sark, MD  ergocalciferol (VITAMIN D2) 50000 units capsule Take 50,000 Units by mouth 2 (two) times a week. Takes on Wednesdays and Sundays.    Historical Provider, MD  ferrous sulfate 325 (65 FE) MG tablet Take 325 mg by mouth daily with breakfast.    Historical Provider, MD  hydrALAZINE (APRESOLINE) 25 MG tablet Take 25 mg by mouth 3 (three) times daily.    Historical Provider, MD  losartan (COZAAR) 100 MG tablet Take 100 mg by mouth daily.    Historical Provider, MD  simvastatin (ZOCOR) 40 MG tablet Take 40 mg by mouth daily.    Historical Provider, MD    Family History Family History  Problem Relation Age of Onset  .  CAD Father   . Heart attack Father   . Diabetes Mellitus II Father   . Hypertension Father   . Lupus Brother     Social History Social History  Substance Use Topics  . Smoking status: Never Smoker  . Smokeless tobacco: Never Used  . Alcohol use No     Allergies   Sulfa antibiotics   Review of Systems Review of Systems  Respiratory: Positive for shortness of breath.   Cardiovascular: Positive for chest pain.  Gastrointestinal: Negative for nausea.  Musculoskeletal: Positive for back pain.  All other systems reviewed and are negative.    Physical Exam Updated Vital Signs BP 160/68   Pulse (!) 52   Temp 97.8 F  (36.6 C) (Oral)   Resp 15   Ht 5' (1.524 m)   Wt 117 lb (53.1 kg)   SpO2 98%   BMI 22.85 kg/m   Physical Exam  Constitutional: She is oriented to person, place, and time. She appears well-developed and well-nourished. No distress.  HENT:  Head: Normocephalic and atraumatic.  Mouth/Throat: Oropharynx is clear and moist. No oropharyngeal exudate.  Eyes: Conjunctivae and EOM are normal. Pupils are equal, round, and reactive to light. Right eye exhibits no discharge. Left eye exhibits no discharge. No scleral icterus.  Neck: Normal range of motion. Neck supple. No JVD present. No thyromegaly present.  Cardiovascular: Normal rate, regular rhythm, normal heart sounds and intact distal pulses.  Exam reveals no gallop and no friction rub.   No murmur heard. Pulmonary/Chest: Effort normal and breath sounds normal. No respiratory distress. She has no wheezes. She has no rales.  Abdominal: Soft. Bowel sounds are normal. She exhibits no distension and no mass. There is tenderness.  mild tenderness in epigastrium.  Musculoskeletal: Normal range of motion. She exhibits no edema or tenderness.  Lymphadenopathy:    She has no cervical adenopathy.  Neurological: She is alert and oriented to person, place, and time. Coordination normal.  Skin: Skin is warm and dry. No rash noted. She is not diaphoretic. No erythema.  Psychiatric: She has a normal mood and affect. Her behavior is normal.  Nursing note and vitals reviewed.    ED Treatments / Results   DIAGNOSTIC STUDIES: Oxygen Saturation is 99% on RA, normal by my interpretation.    COORDINATION OF CARE: 8:19 AM Discussed treatment plan with pt at bedside which includes labs, chest XR and EKG and pt agreed to plan.   Labs (all labs ordered are listed, but only abnormal results are displayed) Labs Reviewed  BASIC METABOLIC PANEL - Abnormal; Notable for the following:       Result Value   Chloride 112 (*)    CO2 20 (*)    Glucose, Bld 105  (*)    BUN 36 (*)    Creatinine, Ser 1.33 (*)    Calcium 8.6 (*)    GFR calc non Af Amer 38 (*)    GFR calc Af Amer 44 (*)    All other components within normal limits  CBC - Abnormal; Notable for the following:    RBC 3.42 (*)    Hemoglobin 11.2 (*)    HCT 31.9 (*)    All other components within normal limits  TROPONIN I    EKG  EKG Interpretation  Date/Time:  Wednesday July 18 2016 07:33:06 EST Ventricular Rate:  62 PR Interval:    QRS Duration: 99 QT Interval:  436 QTC Calculation: 443 R Axis:   -20 Text Interpretation:  Sinus rhythm Ventricular premature complex Borderline left axis deviation Confirmed by Wilson Singer  MD, [place] [id number]) on 07/18/2016 7:51:49 AM       Radiology Dg Chest 2 View  Result Date: 07/18/2016 CLINICAL DATA:  Central chest pain beginning last night. EXAM: CHEST  2 VIEW COMPARISON:  01/11/2014 FINDINGS: Artifact overlies the chest. Heart size is normal. There is aortic atherosclerosis. The lungs are clear. The vascularity is normal. Ordinary degenerative changes affect the spine. IMPRESSION: No active disease.  Aortic atherosclerosis. Electronically Signed   By: Nelson Chimes M.D.   On: 07/18/2016 08:01    Procedures Procedures (including critical care time)  Medications Ordered in ED Medications - No data to display   Initial Impression / Assessment and Plan / ED Course  I have reviewed the triage vital signs and the nursing notes.  Pertinent labs & imaging results that were available during my care of the patient were reviewed by me and considered in my medical decision making (see chart for details).  Clinical Course   75 year old female with lower sternal/epigastric pain. Doubt ACS, PE, dissection or other emergent process. May potentially be PUD given her other complaints. We'll give a trial of PPI. Return precautions were discussed. Outpatient follow-up otherwise.  Final Clinical Impressions(s) / ED Diagnoses   Final diagnoses:    Chest pain, unspecified type    New Prescriptions New Prescriptions   No medications on file    I personally preformed the services scribed in my presence. The recorded information has been reviewed is accurate. Virgel Manifold, MD.     Virgel Manifold, MD 07/20/16 [phone]

## 2017-07-25 ENCOUNTER — Other Ambulatory Visit (HOSPITAL_COMMUNITY): Payer: Self-pay | Admitting: *Deleted

## 2017-07-25 DIAGNOSIS — Z780 Asymptomatic menopausal state: Secondary | ICD-10-CM

## 2017-08-01 ENCOUNTER — Other Ambulatory Visit (HOSPITAL_COMMUNITY): Payer: Medicare Other

## 2017-08-02 ENCOUNTER — Ambulatory Visit (HOSPITAL_COMMUNITY)
Admission: RE | Admit: 2017-08-02 | Discharge: 2017-08-02 | Disposition: A | Discharge status: Home / Self Care | Payer: Medicare Other | Source: Ambulatory Visit | Attending: *Deleted | Admitting: *Deleted

## 2017-08-02 DIAGNOSIS — E782 Mixed hyperlipidemia: Secondary | ICD-10-CM | POA: Insufficient documentation

## 2017-08-02 DIAGNOSIS — M810 Age-related osteoporosis without current pathological fracture: Secondary | ICD-10-CM | POA: Insufficient documentation

## 2017-08-02 DIAGNOSIS — Z780 Asymptomatic menopausal state: Secondary | ICD-10-CM

## 2017-09-04 DIAGNOSIS — R809 Proteinuria, unspecified: Secondary | ICD-10-CM | POA: Insufficient documentation

## 2017-09-06 ENCOUNTER — Other Ambulatory Visit (HOSPITAL_COMMUNITY)
Admission: RE | Admit: 2017-09-06 | Discharge: 2017-09-06 | Disposition: A | Discharge status: Home / Self Care | Payer: Medicare Other | Source: Ambulatory Visit | Attending: Nephrology | Admitting: Nephrology

## 2017-09-06 DIAGNOSIS — N059 Unspecified nephritic syndrome with unspecified morphologic changes: Secondary | ICD-10-CM | POA: Diagnosis present

## 2017-09-06 LAB — BASIC METABOLIC PANEL
Anion gap: 10 (ref 5–15)
BUN: 41 mg/dL — ABNORMAL HIGH (ref 6–20)
CO2: 24 mmol/L (ref 22–32)
Calcium: 8.4 mg/dL — ABNORMAL LOW (ref 8.9–10.3)
Chloride: 104 mmol/L (ref 101–111)
Creatinine, Ser: 1.58 mg/dL — ABNORMAL HIGH (ref 0.44–1.00)
GFR calc Af Amer: 35 mL/min — ABNORMAL LOW (ref >60)
GFR calc non Af Amer: 30 mL/min — ABNORMAL LOW (ref >60)
Glucose, Bld: 133 mg/dL — ABNORMAL HIGH (ref 65–99)
Potassium: 3.7 mmol/L (ref 3.5–5.1)
Sodium: 138 mmol/L (ref 135–145)

## 2017-09-06 LAB — CBC
HCT: 29 % — ABNORMAL LOW (ref 36.0–46.0)
Hemoglobin: 10 g/dL — ABNORMAL LOW (ref 12.0–15.0)
MCH: 32.3 pg (ref 26.0–34.0)
MCHC: 34.5 g/dL (ref 30.0–36.0)
MCV: 93.5 fL (ref 78.0–100.0)
Platelets: 244 10*3/uL (ref 150–400)
RBC: 3.1 MIL/uL — ABNORMAL LOW (ref 3.87–5.11)
RDW: 12.3 % (ref 11.5–15.5)
WBC: 5.8 10*3/uL (ref 4.0–10.5)

## 2017-11-15 DIAGNOSIS — N184 Chronic kidney disease, stage 4 (severe): Secondary | ICD-10-CM | POA: Insufficient documentation

## 2018-04-21 ENCOUNTER — Other Ambulatory Visit (HOSPITAL_COMMUNITY)
Admission: RE | Admit: 2018-04-21 | Discharge: 2018-04-21 | Disposition: A | Discharge status: Home / Self Care | Payer: Medicare Other | Source: Ambulatory Visit | Attending: Nephrology | Admitting: Nephrology

## 2018-04-21 DIAGNOSIS — N184 Chronic kidney disease, stage 4 (severe): Secondary | ICD-10-CM | POA: Insufficient documentation

## 2018-04-21 LAB — BASIC METABOLIC PANEL
Anion gap: 7 (ref 5–15)
BUN: 48 mg/dL — ABNORMAL HIGH (ref 8–23)
CO2: 21 mmol/L — ABNORMAL LOW (ref 22–32)
Calcium: 8.4 mg/dL — ABNORMAL LOW (ref 8.9–10.3)
Chloride: 111 mmol/L (ref 98–111)
Creatinine, Ser: 1.99 mg/dL — ABNORMAL HIGH (ref 0.44–1.00)
GFR calc Af Amer: 27 mL/min — ABNORMAL LOW (ref >60)
GFR calc non Af Amer: 23 mL/min — ABNORMAL LOW (ref >60)
Glucose, Bld: 101 mg/dL — ABNORMAL HIGH (ref 70–99)
Potassium: 5 mmol/L (ref 3.5–5.1)
Sodium: 139 mmol/L (ref 135–145)

## 2018-04-29 ENCOUNTER — Encounter (HOSPITAL_COMMUNITY): Payer: Self-pay | Admitting: Internal Medicine

## 2018-04-29 ENCOUNTER — Inpatient Hospital Stay (HOSPITAL_COMMUNITY): Payer: Medicare Other

## 2018-04-29 ENCOUNTER — Other Ambulatory Visit: Payer: Self-pay

## 2018-04-29 ENCOUNTER — Inpatient Hospital Stay (HOSPITAL_COMMUNITY): Payer: Medicare Other | Attending: Internal Medicine | Admitting: Internal Medicine

## 2018-04-29 VITALS — BP 137/43 | HR 65 | Temp 97.8°F | Resp 16 | Wt 116.3 lb

## 2018-04-29 DIAGNOSIS — M810 Age-related osteoporosis without current pathological fracture: Secondary | ICD-10-CM

## 2018-04-29 DIAGNOSIS — Z79899 Other long term (current) drug therapy: Secondary | ICD-10-CM | POA: Insufficient documentation

## 2018-04-29 DIAGNOSIS — F329 Major depressive disorder, single episode, unspecified: Secondary | ICD-10-CM | POA: Diagnosis not present

## 2018-04-29 DIAGNOSIS — D638 Anemia in other chronic diseases classified elsewhere: Secondary | ICD-10-CM

## 2018-04-29 DIAGNOSIS — N289 Disorder of kidney and ureter, unspecified: Secondary | ICD-10-CM | POA: Diagnosis not present

## 2018-04-29 DIAGNOSIS — I10 Essential (primary) hypertension: Secondary | ICD-10-CM | POA: Diagnosis not present

## 2018-04-29 DIAGNOSIS — M109 Gout, unspecified: Secondary | ICD-10-CM | POA: Diagnosis not present

## 2018-04-29 DIAGNOSIS — E782 Mixed hyperlipidemia: Secondary | ICD-10-CM | POA: Insufficient documentation

## 2018-04-29 DIAGNOSIS — D649 Anemia, unspecified: Secondary | ICD-10-CM

## 2018-04-29 DIAGNOSIS — M7918 Myalgia, other site: Secondary | ICD-10-CM

## 2018-04-29 LAB — CBC WITH DIFFERENTIAL/PLATELET
Basophils Absolute: 0 10*3/uL (ref 0.0–0.1)
Basophils Relative: 0 %
Eosinophils Absolute: 0.2 10*3/uL (ref 0.0–0.5)
Eosinophils Relative: 3 %
HCT: 29.3 % — ABNORMAL LOW (ref 36.0–46.0)
Hemoglobin: 9.9 g/dL — ABNORMAL LOW (ref 12.0–15.0)
Lymphocytes Relative: 14 %
Lymphs Abs: 1 10*3/uL (ref 0.7–4.0)
MCH: 32.1 pg (ref 26.0–34.0)
MCHC: 33.8 g/dL (ref 30.0–36.0)
MCV: 95.1 fL (ref 80.0–100.0)
Monocytes Absolute: 0.5 10*3/uL (ref 0.1–1.0)
Monocytes Relative: 7 %
Neutro Abs: 5.4 10*3/uL (ref 1.7–7.7)
Neutrophils Relative %: 76 %
Platelets: 285 10*3/uL (ref 150–400)
RBC: 3.08 MIL/uL — ABNORMAL LOW (ref 3.87–5.11)
RDW: 12.3 % (ref 11.5–15.5)
WBC: 7 10*3/uL (ref 4.0–10.5)
nRBC: 0 % (ref 0.0–0.2)

## 2018-04-29 LAB — VITAMIN B12: Vitamin B-12: 1378 pg/mL — ABNORMAL HIGH (ref 180–914)

## 2018-04-29 LAB — COMPREHENSIVE METABOLIC PANEL
ALT: 25 U/L (ref 0–44)
AST: 28 U/L (ref 15–41)
Albumin: 3.4 g/dL — ABNORMAL LOW (ref 3.5–5.0)
Alkaline Phosphatase: 51 U/L (ref 38–126)
Anion gap: 9 (ref 5–15)
BUN: 65 mg/dL — ABNORMAL HIGH (ref 8–23)
CO2: 22 mmol/L (ref 22–32)
Calcium: 8.6 mg/dL — ABNORMAL LOW (ref 8.9–10.3)
Chloride: 106 mmol/L (ref 98–111)
Creatinine, Ser: 2.26 mg/dL — ABNORMAL HIGH (ref 0.44–1.00)
GFR calc Af Amer: 23 mL/min — ABNORMAL LOW (ref >60)
GFR calc non Af Amer: 20 mL/min — ABNORMAL LOW (ref >60)
Glucose, Bld: 111 mg/dL — ABNORMAL HIGH (ref 70–99)
Potassium: 4.6 mmol/L (ref 3.5–5.1)
Sodium: 137 mmol/L (ref 135–145)
Total Bilirubin: 0.3 mg/dL (ref 0.3–1.2)
Total Protein: 6.8 g/dL (ref 6.5–8.1)

## 2018-04-29 LAB — LACTATE DEHYDROGENASE: LDH: 197 U/L — ABNORMAL HIGH (ref 98–192)

## 2018-04-29 LAB — FERRITIN
Ferritin: 227 ng/mL (ref 11–307)
Ferritin: 239 ng/mL (ref 11–307)

## 2018-04-29 LAB — SEDIMENTATION RATE: Sed Rate: 82 mm/hr — ABNORMAL HIGH (ref 0–22)

## 2018-04-29 LAB — FOLATE: Folate: 33.3 ng/mL (ref >5.9)

## 2018-04-29 NOTE — Progress Notes (Signed)
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  Referring Physician:  Dr. Melina Copa  Assessment and Plan:  1.  Normocytic anemia.  Labs were performed today 04/29/2018 and were reviewed and showed a white count of 7000 hemoglobin 9.9 hematocrit 29.3 MCV was 95 platelets 295,000.  She has a normal differential.  Chemistries within normal limits with a potassium of 4.6 liver function tests are normal.  The patient's creatinine was noted to be elevated at 2.3.  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  I am awaiting results of her serum protein electrophoresis.  Her ferritin was noted to be 239 which is within normal limits.  Folate and B12 were normal.  Will discuss with her nephrologist erythropoietin therapy pending the results of her SPEP.  2.  Renal insufficiency.  She is followed by nephrology.  It was noted her creatinine was 2.26.  I am awaiting results of her serum protein electrophoresis.  We will discuss with nephrology possible EPO therapy.  3.  Osteoporosis.  Bone density done 08/02/2017 reviewed and  confirmed this.  Ports she is not on bisphosphonate therapy and is not taking calcium and vitamin D.  She will be given option of Prolia 60 mg every 6 months.  She will also the repeat bone density in January 2021.  4.  Hypertension.  Blood pressure 137/43.  Follow-up with PCP.  5.  Health maintenance.  Patient had a colonoscopy in June 2017 and was normal.  She should follow-up with GI as recommended.  She should  continue mammogram screenings as recommended.  Greater than 40 minutes spent with more than 50% spent in counseling and coordination of care.  HPI: 77 year old female referred for evaluation of anemia.  She reports she has been told she was anemic for some time.  She denies any blood in her stool or urine.  She is followed by nephrology at [facility].  She has undergone colonoscopy and mammograms in the past.  Labs done 09/06/2017 showed a white count of 5.8 hemoglobin 10 platelets 244,000 her creatinine was 1.58.  She reports she was diagnosed with osteoporosis but is not on calcium or vitamin D.  She also is not on bisphosphonate therapy.  The patient is seen today for consultation due to normocytic anemia.   Problem List Patient Active Problem List   Diagnosis Date Noted  . Essential hypertension, benign [I10] 01/26/2014  . Palpitations [R00.2] 01/26/2014  . 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 Depression   . Essential hypertension, benign   . GERD (gastroesophageal reflux disease)   . Glomerulonephritis   . Gout   . Mixed hyperlipidemia   . Renal insufficiency     Past Surgical History Past Surgical History:  Procedure Laterality Date  . APPENDECTOMY    . CATARACT EXTRACTION    . COLONOSCOPY N/A 12/28/2015   Procedure: COLONOSCOPY;  Surgeon: Rogene Houston, MD;  [facility];  Service: Endoscopy;  Laterality: N/A;  815  . TUBAL LIGATION      Family History Family History  Problem Relation Age of Onset  . CAD  Father   . Heart attack Father   . Diabetes Mellitus II Father   . Hypertension Father   . Lupus Brother      Social History  reports that she has never smoked. She has never used smokeless tobacco. She reports that she does not drink alcohol or use drugs.  Medications  Current Outpatient Medications:  .  acetaminophen (TYLENOL) 325 MG tablet, Take by mouth., Disp:  , Rfl:  .  carvedilol (COREG) 12.5 MG tablet, Take by mouth., Disp: , Rfl:  .  ezetimibe (ZETIA) 10 MG tablet, Take by mouth., Disp: , Rfl:  .  furosemide (LASIX) 20 MG tablet, Take by mouth., Disp: , Rfl:  .  hydrALAZINE (APRESOLINE) 25 MG tablet, Take by mouth., Disp: , Rfl:  .  lisinopril (PRINIVIL,ZESTRIL) 20 MG tablet, Take by mouth., Disp: , Rfl:  .  Multiple Vitamin (MULTIVITAMIN) tablet, Take 1 tablet by mouth daily., Disp: , Rfl:  .  pantoprazole (PROTONIX) 40 MG tablet, TAKE 1 TABLET BY MOUTH  DAILY, Disp: , Rfl:  .  simvastatin (ZOCOR) 40 MG tablet, Take 40 mg by mouth daily., Disp: , Rfl:  .  sodium bicarbonate 650 MG tablet, Take 1,300 mg by mouth 2 (two) times daily., Disp: , Rfl: 3  Allergies Sulfa antibiotics  Review of Systems Review of Systems - Oncology ROS negative   Physical Exam  Vitals Wt Readings from Last 3 Encounters:  04/29/18 116 lb 4.8 oz (52.8 kg)  07/18/16 117 lb (53.1 kg)  03/13/16 119 lb (54 kg)   Temp Readings from Last 3 Encounters:  04/29/18 97.8 F (36.6 C) (Oral)  07/18/16 97.8 F (36.6 C) (Oral)  12/28/15 97.5 F (36.4 C) (Oral)   BP Readings from Last 3 Encounters:  04/29/18 (!) 137/43  07/18/16 158/66  03/13/16 138/62   Pulse Readings from Last 3 Encounters:  04/29/18 65  07/18/16 (!) 54  03/13/16 (!) 58    Constitutional: Well-developed, well-nourished, and in no distress.   HENT: Head: Normocephalic and atraumatic.  Mouth/Throat: No oropharyngeal exudate. Mucosa moist. Eyes: Pupils are equal, round, and reactive to light. Conjunctivae are normal. No scleral icterus.  Neck: Normal range of motion. Neck supple. No JVD present.  Cardiovascular: Normal rate, regular rhythm and normal heart sounds.  Exam reveals no gallop and no friction rub.   No murmur heard. Pulmonary/Chest: Effort normal and breath sounds normal. No respiratory distress. No wheezes.No rales.  Abdominal: Soft. Bowel sounds are normal. No distension. There  is no tenderness. There is no guarding.  Musculoskeletal: No edema or tenderness.  Lymphadenopathy: No cervical, axillary or supraclavicular adenopathy.  Neurological: Alert and oriented to person, place, and time. No cranial nerve deficit.  Skin: Skin is warm and dry. No rash noted. No erythema. No pallor.  Psychiatric: Affect and judgment normal.   Labs Appointment on 04/29/2018  Component Date Value Ref Range Status  . WBC 04/29/2018 7.0  4.0 - 10.5 K/uL Final  . RBC 04/29/2018 3.08* 3.87 - 5.11 MIL/uL Final  . Hemoglobin 04/29/2018 9.9* 12.0 - 15.0 g/dL Final  . HCT 04/29/2018 29.3* 36.0 - 46.0 % Final  . MCV 04/29/2018 95.1  80.0 - 100.0 fL Final  . MCH 04/29/2018 32.1  26.0 - 34.0 pg Final  . MCHC 04/29/2018 33.8  30.0 - 36.0 g/dL Final  . RDW 04/29/2018 12.3  11.5 - 15.5 % Final  . Platelets 04/29/2018 285  150 - 400 K/uL Final  . nRBC 04/29/2018 0.0  0.0 -  0.2 % Final  . Neutrophils Relative % 04/29/2018 76  % Final  . Neutro Abs 04/29/2018 5.4  1.7 - 7.7 K/uL Final  . Lymphocytes Relative 04/29/2018 14  % Final  . Lymphs Abs 04/29/2018 1.0  0.7 - 4.0 K/uL Final  . Monocytes Relative 04/29/2018 7  % Final  . Monocytes Absolute 04/29/2018 0.5  0.1 - 1.0 K/uL Final  . Eosinophils Relative 04/29/2018 3  % Final  . Eosinophils Absolute 04/29/2018 0.2  0.0 - 0.5 K/uL Final  . Basophils Relative 04/29/2018 0  % Final  . Basophils Absolute 04/29/2018 0.0  0.0 - 0.1 K/uL Final   Performed at [facility], [address], [place], [place] [address]  . Sodium 04/29/2018 137  135 - 145 mmol/L Final  . Potassium 04/29/2018 4.6  3.5 - 5.1 mmol/L Final  . Chloride 04/29/2018 106  98 - 111 mmol/L Final  . CO2 04/29/2018 22  22 - 32 mmol/L Final  . Glucose, Bld 04/29/2018 111* 70 - 99 mg/dL Final  . BUN 04/29/2018 65* 8 - 23 mg/dL Final  . Creatinine, Ser 04/29/2018 2.26* 0.44 - 1.00 mg/dL Final  . Calcium 04/29/2018 8.6* 8.9 - 10.3 mg/dL Final  . Total Protein 04/29/2018 6.8  6.5 - 8.1  g/dL Final  . Albumin 04/29/2018 3.4* 3.5 - 5.0 g/dL Final  . AST 04/29/2018 28  15 - 41 U/L Final  . ALT 04/29/2018 25  0 - 44 U/L Final  . Alkaline Phosphatase 04/29/2018 51  38 - 126 U/L Final  . Total Bilirubin 04/29/2018 0.3  0.3 - 1.2 mg/dL Final  . GFR calc non Af Amer 04/29/2018 20* >60 mL/min Final  . GFR calc Af Amer 04/29/2018 23* >60 mL/min Final   Comment: (NOTE) The eGFR has been calculated using the CKD EPI equation. This calculation has not been validated in all clinical situations. eGFR's persistently <60 mL/min signify possible Chronic Kidney Disease.   Georgiann Hahn gap 04/29/2018 9  5 - 15 Final   Performed at [facility], [address], [place], [place] [address]  . LDH 04/29/2018 197* 98 - 192 U/L Final   Performed at [facility], [address], [place], [place] [address]  . Ferritin 04/29/2018 227  11 - 307 ng/mL Final   Performed at [facility], [address] [place], [place] [address]  . Vitamin B-12 04/29/2018 1,378* 180 - 914 pg/mL Final   Comment: (NOTE) This assay is not validated for testing neonatal or myeloproliferative syndrome specimens for Vitamin B12 levels. Performed at [facility], [address] [place], [place] [address]   . Folate 04/29/2018 33.3  >5.9 ng/mL Final   Comment: RESULTS CONFIRMED BY MANUAL DILUTION Performed at [facility], [address], [place], [place] [address]   . Ferritin 04/29/2018 239  11 - 307 ng/mL Final   Performed at [facility], [address] [place],  [address]  . Sed Rate 04/29/2018 82* 0 - 22 mm/hr Final   Performed at [facility], [address] [place],  [address]     Pathology Orders Placed This Encounter  Procedures  . CBC with Differential/Platelet    Standing Status:   Future    Number of Occurrences:   1    Standing Expiration Date:   04/30/2019  . Comprehensive metabolic panel    Standing Status:   Future    Number of Occurrences:   1    Standing Expiration Date:   04/30/2019  .  Lactate dehydrogenase    Standing Status:   Future    Number of Occurrences:   1    Standing Expiration Date:   04/30/2019  . Protein electrophoresis, serum    Standing Status:   Future    Number of Occurrences:   1    Standing Expiration Date:   04/30/2019  . Ferritin    Standing Status:   Future    Number of Occurrences:   1    Standing Expiration Date:   04/30/2019  . Vitamin B12    Standing Status:   Future    Number of Occurrences:   1    Standing Expiration Date:   04/30/2019  . Folate    Standing Status:   Future    Number of Occurrences:   1    Standing Expiration Date:   04/30/2019  . CBC with Differential/Platelet    Standing Status:   Future    Number of Occurrences:   1    Standing Expiration Date:   04/30/2019  . Ferritin    Standing Status:   Future    Number of Occurrences:   1    Standing Expiration Date:   04/30/2019  . Rheumatoid factor    Standing Status:   Future    Number of Occurrences:   1    Standing Expiration Date:   04/30/2019  . Sedimentation rate    Standing Status:   Future    Number of Occurrences:   1    Standing Expiration Date:   04/30/2019  . C-reactive protein    Standing Status:   Future    Number of Occurrences:   1    Standing Expiration Date:   04/30/2019       Zoila Shutter MD

## 2018-04-30 LAB — PROTEIN ELECTROPHORESIS, SERUM
A/G Ratio: 1 (ref 0.7–1.7)
Albumin ELP: 3.1 g/dL (ref 2.9–4.4)
Alpha-1-Globulin: 0.1 g/dL (ref 0.0–0.4)
Alpha-2-Globulin: 0.9 g/dL (ref 0.4–1.0)
Beta Globulin: 0.8 g/dL (ref 0.7–1.3)
Gamma Globulin: 1.2 g/dL (ref 0.4–1.8)
Globulin, Total: 3.1 g/dL (ref 2.2–3.9)
Total Protein ELP: 6.2 g/dL (ref 6.0–8.5)

## 2018-04-30 LAB — C-REACTIVE PROTEIN: CRP: <0.8 mg/dL (ref <1.0)

## 2018-04-30 LAB — RHEUMATOID FACTOR: Rhuematoid fact SerPl-aCnc: <10 IU/mL (ref 0.0–13.9)

## 2018-05-22 ENCOUNTER — Other Ambulatory Visit (HOSPITAL_COMMUNITY): Payer: Self-pay | Admitting: *Deleted

## 2018-05-22 DIAGNOSIS — D638 Anemia in other chronic diseases classified elsewhere: Secondary | ICD-10-CM

## 2018-05-23 ENCOUNTER — Inpatient Hospital Stay (HOSPITAL_COMMUNITY): Payer: Medicare Other | Attending: Internal Medicine

## 2018-05-23 ENCOUNTER — Other Ambulatory Visit (HOSPITAL_COMMUNITY)
Admission: RE | Admit: 2018-05-23 | Discharge: 2018-05-23 | Disposition: A | Discharge status: Home / Self Care | Payer: Medicare Other | Source: Ambulatory Visit | Attending: Nephrology | Admitting: Nephrology

## 2018-05-23 DIAGNOSIS — D631 Anemia in chronic kidney disease: Secondary | ICD-10-CM | POA: Insufficient documentation

## 2018-05-23 DIAGNOSIS — I10 Essential (primary) hypertension: Secondary | ICD-10-CM | POA: Diagnosis not present

## 2018-05-23 DIAGNOSIS — N189 Chronic kidney disease, unspecified: Secondary | ICD-10-CM | POA: Insufficient documentation

## 2018-05-23 DIAGNOSIS — M810 Age-related osteoporosis without current pathological fracture: Secondary | ICD-10-CM | POA: Diagnosis not present

## 2018-05-23 DIAGNOSIS — D638 Anemia in other chronic diseases classified elsewhere: Secondary | ICD-10-CM

## 2018-05-23 DIAGNOSIS — N184 Chronic kidney disease, stage 4 (severe): Secondary | ICD-10-CM | POA: Diagnosis present

## 2018-05-23 DIAGNOSIS — Z79899 Other long term (current) drug therapy: Secondary | ICD-10-CM | POA: Diagnosis not present

## 2018-05-23 LAB — COMPREHENSIVE METABOLIC PANEL
ALT: 24 U/L (ref 0–44)
AST: 29 U/L (ref 15–41)
Albumin: 3.8 g/dL (ref 3.5–5.0)
Alkaline Phosphatase: 50 U/L (ref 38–126)
Anion gap: 10 (ref 5–15)
BUN: 61 mg/dL — ABNORMAL HIGH (ref 8–23)
CO2: 23 mmol/L (ref 22–32)
Calcium: 8.7 mg/dL — ABNORMAL LOW (ref 8.9–10.3)
Chloride: 107 mmol/L (ref 98–111)
Creatinine, Ser: 2.23 mg/dL — ABNORMAL HIGH (ref 0.44–1.00)
GFR calc Af Amer: 23 mL/min — ABNORMAL LOW (ref >60)
GFR calc non Af Amer: 20 mL/min — ABNORMAL LOW (ref >60)
Glucose, Bld: 100 mg/dL — ABNORMAL HIGH (ref 70–99)
Potassium: 4.5 mmol/L (ref 3.5–5.1)
Sodium: 140 mmol/L (ref 135–145)
Total Bilirubin: 0.4 mg/dL (ref 0.3–1.2)
Total Protein: 7.4 g/dL (ref 6.5–8.1)

## 2018-05-23 LAB — CBC WITH DIFFERENTIAL/PLATELET
Abs Immature Granulocytes: 0.01 10*3/uL (ref 0.00–0.07)
Basophils Absolute: 0 10*3/uL (ref 0.0–0.1)
Basophils Relative: 0 %
Eosinophils Absolute: 0.2 10*3/uL (ref 0.0–0.5)
Eosinophils Relative: 3 %
HCT: 29.9 % — ABNORMAL LOW (ref 36.0–46.0)
Hemoglobin: 9.6 g/dL — ABNORMAL LOW (ref 12.0–15.0)
Immature Granulocytes: 0 %
Lymphocytes Relative: 12 %
Lymphs Abs: 0.8 10*3/uL (ref 0.7–4.0)
MCH: 31.1 pg (ref 26.0–34.0)
MCHC: 32.1 g/dL (ref 30.0–36.0)
MCV: 96.8 fL (ref 80.0–100.0)
Monocytes Absolute: 0.6 10*3/uL (ref 0.1–1.0)
Monocytes Relative: 9 %
Neutro Abs: 4.8 10*3/uL (ref 1.7–7.7)
Neutrophils Relative %: 76 %
Platelets: 297 10*3/uL (ref 150–400)
RBC: 3.09 MIL/uL — ABNORMAL LOW (ref 3.87–5.11)
RDW: 11.7 % (ref 11.5–15.5)
WBC: 6.4 10*3/uL (ref 4.0–10.5)
nRBC: 0 % (ref 0.0–0.2)

## 2018-05-23 LAB — BASIC METABOLIC PANEL
Anion gap: 9 (ref 5–15)
BUN: 62 mg/dL — ABNORMAL HIGH (ref 8–23)
CO2: 23 mmol/L (ref 22–32)
Calcium: 8.8 mg/dL — ABNORMAL LOW (ref 8.9–10.3)
Chloride: 108 mmol/L (ref 98–111)
Creatinine, Ser: 2.28 mg/dL — ABNORMAL HIGH (ref 0.44–1.00)
GFR calc Af Amer: 22 mL/min — ABNORMAL LOW (ref >60)
GFR calc non Af Amer: 19 mL/min — ABNORMAL LOW (ref >60)
Glucose, Bld: 100 mg/dL — ABNORMAL HIGH (ref 70–99)
Potassium: 4.5 mmol/L (ref 3.5–5.1)
Sodium: 140 mmol/L (ref 135–145)

## 2018-05-23 LAB — LACTATE DEHYDROGENASE: LDH: 202 U/L — ABNORMAL HIGH (ref 98–192)

## 2018-05-27 ENCOUNTER — Encounter (HOSPITAL_COMMUNITY): Payer: Self-pay | Admitting: Internal Medicine

## 2018-05-27 ENCOUNTER — Inpatient Hospital Stay (HOSPITAL_COMMUNITY): Payer: Medicare Other | Admitting: Internal Medicine

## 2018-05-27 VITALS — BP 128/49 | HR 70 | Temp 98.2°F | Resp 20 | Wt 115.0 lb

## 2018-05-27 DIAGNOSIS — I10 Essential (primary) hypertension: Secondary | ICD-10-CM

## 2018-05-27 DIAGNOSIS — N189 Chronic kidney disease, unspecified: Secondary | ICD-10-CM

## 2018-05-27 DIAGNOSIS — Z79899 Other long term (current) drug therapy: Secondary | ICD-10-CM

## 2018-05-27 DIAGNOSIS — D631 Anemia in chronic kidney disease: Secondary | ICD-10-CM | POA: Diagnosis not present

## 2018-05-27 DIAGNOSIS — D638 Anemia in other chronic diseases classified elsewhere: Secondary | ICD-10-CM

## 2018-05-27 DIAGNOSIS — M810 Age-related osteoporosis without current pathological fracture: Secondary | ICD-10-CM

## 2018-05-27 NOTE — Patient Instructions (Signed)
[facility]  Discharge Instructions:   _______________________________________________________________  Thank you for choosing [facility] to provide your oncology and hematology care.  To afford each patient quality time with our providers, please arrive at least 15 minutes before your scheduled appointment.  You need to re-schedule your appointment if you arrive 10 or more minutes late.  We strive to give you quality time with our providers, and arriving late affects you and other patients whose appointments are after yours.  Also, if you no show three or more times for appointments you may be dismissed from the clinic.  Again, thank you for choosing  [facility]. Our hope is that these requests will allow you access to exceptional care and in a timely manner. _______________________________________________________________  If you have questions after your visit, please contact our office at [phone] between the hours of 8:30 a.m. and 5:00 p.m. Voicemails left after 4:30 p.m. will not be returned until the following business day. _______________________________________________________________  For prescription refill requests, have your pharmacy contact our office. _______________________________________________________________  Recommendations made by the consultant and any test results will be sent to your referring physician. _______________________________________________________________ 

## 2018-05-27 NOTE — Progress Notes (Signed)
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  Assessment and Plan:  1.  Normocytic anemia.  Labs done 05/23/2018 reviewed and showed WBC 6.4 HB 9.6 plts 297,000.  Chemistries WNl with K+ 4.5 Cr 2.23 and normal LFts.  SPEP done  today 04/29/2018 was negative.  Pt has anemia related to chronic renal insufficiency.  Pt should continue to follow-up with nephrology as directed.  If counts worsen, pt has option of EPO therapy after discussion with nephrology. Will repeat labs in 07/2018.    2.  Renal insufficiency.  She is followed by nephrology.  Creatinine was 2.23.  She has a normal serum protein electrophoresis.   Follow-up with nephrology as directed.    3.  Osteoporosis.  Bone density done 08/02/2017 reviewed and  confirmed this.  Pt is not on bisphosphonate therapy.  She is now on calcium and vitamin D.  I have discussed with her option of Prolia 60 mg every 6 months.  She will also the repeat bone density in January 2021.  Side effects of prolia discussed and she was provided written information.  Will discuss with nephrology if pt will be a candidate for therapy.    4.  Hypertension.  Blood pressure 128/49.   Follow-up with PCP.  5.  Health maintenance.  Patient had a colonoscopy in June 2017 and was normal.  Follow-up with GI as recommended.  Mammogram screenings as recommended.  Interval history:  Historical data obtained from note dated 04/29/2018.  77 year old female referred for evaluation of anemia.  She reports she has been told she was anemic for some time.  She denies any blood in her stool or urine.  She is followed by nephrology at [facility].  She has undergone colonoscopy and mammograms in the past.  Labs done 09/06/2017 showed a white count of 5.8 hemoglobin 10 platelets 244,000 her  creatinine was 1.58.  She reports she was diagnosed with osteoporosis but is not on calcium or vitamin D.  She also is not on bisphosphonate therapy.    Current Status:  Pt is seen today for follow-up.  She is here to go over labs.    Problem List Patient Active Problem List   Diagnosis Date Noted  . Essential hypertension, benign [I10] 01/26/2014  . Palpitations [R00.2] 01/26/2014  . 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 Depression   . Essential hypertension, benign   . GERD (gastroesophageal reflux disease)   . Glomerulonephritis   . Gout   . Mixed hyperlipidemia   . Renal insufficiency     Past Surgical History Past Surgical History:  Procedure Laterality Date  . APPENDECTOMY    . CATARACT EXTRACTION    . COLONOSCOPY N/A 12/28/2015   Procedure: COLONOSCOPY;  Surgeon: Rogene Houston, MD;  [facility];  Service: Endoscopy;  Laterality: N/A;  815  . TUBAL LIGATION      Family History Family History  Problem Relation Age of Onset  . CAD Father   . Heart attack Father   . Diabetes Mellitus II Father   . Hypertension Father   . Lupus Brother      Social History  reports that she has never smoked. She has never used smokeless tobacco. She reports that she does not drink alcohol or  use drugs.  Medications  Current Outpatient Medications:  .  acetaminophen (TYLENOL) 325 MG tablet, Take by mouth., Disp: , Rfl:  .  Calcium Carbonate (CALTRATE 600 PO), Take 1 tablet by mouth., Disp: , Rfl:  .  carvedilol (COREG) 12.5 MG tablet, Take by mouth., Disp: , Rfl:  .  ezetimibe (ZETIA) 10 MG tablet, Take by mouth., Disp: , Rfl:  .  furosemide (LASIX) 20 MG tablet, Take by mouth., Disp: , Rfl:  .  hydrALAZINE (APRESOLINE) 25 MG tablet, Take by mouth., Disp: , Rfl:  .  lisinopril (PRINIVIL,ZESTRIL) 20 MG tablet, Take by mouth., Disp: , Rfl:  .  Multiple Vitamin (MULTIVITAMIN) tablet, Take  1 tablet by mouth daily., Disp: , Rfl:  .  pantoprazole (PROTONIX) 40 MG tablet, TAKE 1 TABLET BY MOUTH  DAILY, Disp: , Rfl:  .  simvastatin (ZOCOR) 40 MG tablet, Take 40 mg by mouth daily., Disp: , Rfl:  .  sodium bicarbonate 650 MG tablet, Take 1,300 mg by mouth 2 (two) times daily., Disp: , Rfl: 3 .  VITAMIN D, CHOLECALCIFEROL, PO, Take 1 tablet by mouth., Disp: , Rfl:   Allergies Sulfa antibiotics  Review of Systems Review of Systems - Oncology ROS negative.     Physical Exam  Vitals Wt Readings from Last 3 Encounters:  05/27/18 115 lb (52.2 kg)  04/29/18 116 lb 4.8 oz (52.8 kg)  07/18/16 117 lb (53.1 kg)   Temp Readings from Last 3 Encounters:  05/27/18 98.2 F (36.8 C) (Oral)  04/29/18 97.8 F (36.6 C) (Oral)  07/18/16 97.8 F (36.6 C) (Oral)   BP Readings from Last 3 Encounters:  05/27/18 (!) 128/49  04/29/18 (!) 137/43  07/18/16 158/66   Pulse Readings from Last 3 Encounters:  05/27/18 70  04/29/18 65  07/18/16 (!) 54   Constitutional: Well-developed, well-nourished, and in no distress.   HENT: Head: Normocephalic and atraumatic.  Mouth/Throat: No oropharyngeal exudate. Mucosa moist. Eyes: Pupils are equal, round, and reactive to light. Conjunctivae are normal. No scleral icterus.  Neck: Normal range of motion. Neck supple. No JVD present.  Cardiovascular: Normal rate, regular rhythm and normal heart sounds.  Exam reveals no gallop and no friction rub.   No murmur heard. Pulmonary/Chest: Effort normal and breath sounds normal. No respiratory distress. No wheezes.No rales.  Abdominal: Soft. Bowel sounds are normal. No distension. There is no tenderness. There is no guarding.  Musculoskeletal: No edema or tenderness.  Lymphadenopathy: No cervical, axillary or supraclavicular adenopathy.  Neurological: Alert and oriented to person, place, and time. No cranial nerve deficit.  Skin: Skin is warm and dry. No rash noted. No erythema. No pallor.  Psychiatric:  Affect and judgment normal.   Labs No visits with results within 3 Day(s) from this visit.  Latest known visit with results is:  Hospital Outpatient Visit on 05/23/2018  Component Date Value Ref Range Status  . Sodium 05/23/2018 140  135 - 145 mmol/L Final  . Potassium 05/23/2018 4.5  3.5 - 5.1 mmol/L Final  . Chloride 05/23/2018 108  98 - 111 mmol/L Final  . CO2 05/23/2018 23  22 - 32 mmol/L Final  . Glucose, Bld 05/23/2018 100* 70 - 99 mg/dL Final  . BUN 05/23/2018 62* 8 - 23 mg/dL Final  . Creatinine, Ser 05/23/2018 2.28* 0.44 - 1.00 mg/dL Final  . Calcium 05/23/2018 8.8* 8.9 - 10.3 mg/dL Final  . GFR calc non Af Amer 05/23/2018 19* >60 mL/min Final  . GFR calc Af Wyvonnia Lora  05/23/2018 22* >60 mL/min Final   Comment: (NOTE) The eGFR has been calculated using the CKD EPI equation. This calculation has not been validated in all clinical situations. eGFR's persistently <60 mL/min signify possible Chronic Kidney Disease.   Georgiann Hahn gap 05/23/2018 9  5 - 15 Final   Performed at [facility], [address] [place], [place] [address]     Pathology Orders Placed This Encounter  Procedures  . CBC with Differential/Platelet    Standing Status:   Future    Standing Expiration Date:   05/27/2020  . Comprehensive metabolic panel    Standing Status:   Future    Standing Expiration Date:   05/27/2020  . Lactate dehydrogenase    Standing Status:   Future    Standing Expiration Date:   05/27/2020  . Ferritin    Standing Status:   Future    Standing Expiration Date:   05/27/2020       Zoila Shutter MD

## 2018-06-23 ENCOUNTER — Other Ambulatory Visit (HOSPITAL_COMMUNITY)
Admission: RE | Admit: 2018-06-23 | Discharge: 2018-06-23 | Disposition: A | Discharge status: Home / Self Care | Payer: Medicare Other | Source: Ambulatory Visit | Attending: Nephrology | Admitting: Nephrology

## 2018-06-23 DIAGNOSIS — N184 Chronic kidney disease, stage 4 (severe): Secondary | ICD-10-CM | POA: Diagnosis present

## 2018-06-23 LAB — PHOSPHORUS: Phosphorus: 5.3 mg/dL — ABNORMAL HIGH (ref 2.5–4.6)

## 2018-08-11 ENCOUNTER — Other Ambulatory Visit (HOSPITAL_COMMUNITY)
Admission: RE | Admit: 2018-08-11 | Discharge: 2018-08-11 | Disposition: A | Discharge status: Home / Self Care | Payer: Medicare Other | Source: Ambulatory Visit | Attending: Nephrology | Admitting: Nephrology

## 2018-08-11 ENCOUNTER — Inpatient Hospital Stay (HOSPITAL_COMMUNITY): Payer: Medicare Other | Attending: Hematology

## 2018-08-11 DIAGNOSIS — M810 Age-related osteoporosis without current pathological fracture: Secondary | ICD-10-CM | POA: Diagnosis not present

## 2018-08-11 DIAGNOSIS — N189 Chronic kidney disease, unspecified: Secondary | ICD-10-CM | POA: Insufficient documentation

## 2018-08-11 DIAGNOSIS — D631 Anemia in chronic kidney disease: Secondary | ICD-10-CM | POA: Insufficient documentation

## 2018-08-11 DIAGNOSIS — N184 Chronic kidney disease, stage 4 (severe): Secondary | ICD-10-CM | POA: Insufficient documentation

## 2018-08-11 DIAGNOSIS — I10 Essential (primary) hypertension: Secondary | ICD-10-CM | POA: Insufficient documentation

## 2018-08-11 DIAGNOSIS — Z79899 Other long term (current) drug therapy: Secondary | ICD-10-CM | POA: Insufficient documentation

## 2018-08-11 DIAGNOSIS — D638 Anemia in other chronic diseases classified elsewhere: Secondary | ICD-10-CM

## 2018-08-11 LAB — CBC WITH DIFFERENTIAL/PLATELET
Abs Immature Granulocytes: 0.01 10*3/uL (ref 0.00–0.07)
Basophils Absolute: 0 10*3/uL (ref 0.0–0.1)
Basophils Relative: 1 %
Eosinophils Absolute: 0.2 10*3/uL (ref 0.0–0.5)
Eosinophils Relative: 4 %
HCT: 29.1 % — ABNORMAL LOW (ref 36.0–46.0)
Hemoglobin: 9.5 g/dL — ABNORMAL LOW (ref 12.0–15.0)
Immature Granulocytes: 0 %
Lymphocytes Relative: 13 %
Lymphs Abs: 0.7 10*3/uL (ref 0.7–4.0)
MCH: 31.5 pg (ref 26.0–34.0)
MCHC: 32.6 g/dL (ref 30.0–36.0)
MCV: 96.4 fL (ref 80.0–100.0)
Monocytes Absolute: 0.5 10*3/uL (ref 0.1–1.0)
Monocytes Relative: 10 %
Neutro Abs: 4 10*3/uL (ref 1.7–7.7)
Neutrophils Relative %: 72 %
Platelets: 249 10*3/uL (ref 150–400)
RBC: 3.02 MIL/uL — ABNORMAL LOW (ref 3.87–5.11)
RDW: 11.5 % (ref 11.5–15.5)
WBC: 5.4 10*3/uL (ref 4.0–10.5)
nRBC: 0 % (ref 0.0–0.2)

## 2018-08-11 LAB — COMPREHENSIVE METABOLIC PANEL
ALT: 20 U/L (ref 0–44)
AST: 26 U/L (ref 15–41)
Albumin: 3.2 g/dL — ABNORMAL LOW (ref 3.5–5.0)
Alkaline Phosphatase: 43 U/L (ref 38–126)
Anion gap: 8 (ref 5–15)
BUN: 53 mg/dL — ABNORMAL HIGH (ref 8–23)
CO2: 23 mmol/L (ref 22–32)
Calcium: 8.7 mg/dL — ABNORMAL LOW (ref 8.9–10.3)
Chloride: 109 mmol/L (ref 98–111)
Creatinine, Ser: 2.19 mg/dL — ABNORMAL HIGH (ref 0.44–1.00)
GFR calc Af Amer: 24 mL/min — ABNORMAL LOW (ref >60)
GFR calc non Af Amer: 21 mL/min — ABNORMAL LOW (ref >60)
Glucose, Bld: 117 mg/dL — ABNORMAL HIGH (ref 70–99)
Potassium: 4 mmol/L (ref 3.5–5.1)
Sodium: 140 mmol/L (ref 135–145)
Total Bilirubin: 0.4 mg/dL (ref 0.3–1.2)
Total Protein: 6.4 g/dL — ABNORMAL LOW (ref 6.5–8.1)

## 2018-08-11 LAB — FERRITIN: Ferritin: 188 ng/mL (ref 11–307)

## 2018-08-11 LAB — BASIC METABOLIC PANEL WITH GFR
Anion gap: 10 (ref 5–15)
BUN: 52 mg/dL — ABNORMAL HIGH (ref 8–23)
CO2: 22 mmol/L (ref 22–32)
Calcium: 8.6 mg/dL — ABNORMAL LOW (ref 8.9–10.3)
Chloride: 107 mmol/L (ref 98–111)
Creatinine, Ser: 2.19 mg/dL — ABNORMAL HIGH (ref 0.44–1.00)
GFR calc Af Amer: 24 mL/min — ABNORMAL LOW
GFR calc non Af Amer: 21 mL/min — ABNORMAL LOW
Glucose, Bld: 117 mg/dL — ABNORMAL HIGH (ref 70–99)
Potassium: 3.8 mmol/L (ref 3.5–5.1)
Sodium: 139 mmol/L (ref 135–145)

## 2018-08-11 LAB — LACTATE DEHYDROGENASE: LDH: 193 U/L — ABNORMAL HIGH (ref 98–192)

## 2018-08-18 ENCOUNTER — Other Ambulatory Visit: Payer: Self-pay

## 2018-08-18 ENCOUNTER — Inpatient Hospital Stay (HOSPITAL_COMMUNITY): Payer: Medicare Other | Attending: Hematology | Admitting: Internal Medicine

## 2018-08-18 ENCOUNTER — Encounter (HOSPITAL_COMMUNITY): Payer: Self-pay | Admitting: Internal Medicine

## 2018-08-18 VITALS — BP 154/48 | HR 60 | Temp 98.2°F | Resp 18 | Wt 114.0 lb

## 2018-08-18 DIAGNOSIS — M818 Other osteoporosis without current pathological fracture: Secondary | ICD-10-CM | POA: Diagnosis not present

## 2018-08-18 DIAGNOSIS — I129 Hypertensive chronic kidney disease with stage 1 through stage 4 chronic kidney disease, or unspecified chronic kidney disease: Secondary | ICD-10-CM

## 2018-08-18 DIAGNOSIS — N186 End stage renal disease: Secondary | ICD-10-CM

## 2018-08-18 DIAGNOSIS — D631 Anemia in chronic kidney disease: Secondary | ICD-10-CM

## 2018-08-18 DIAGNOSIS — D539 Nutritional anemia, unspecified: Secondary | ICD-10-CM | POA: Insufficient documentation

## 2018-08-18 DIAGNOSIS — N184 Chronic kidney disease, stage 4 (severe): Secondary | ICD-10-CM | POA: Diagnosis present

## 2018-08-18 HISTORY — DX: Chronic kidney disease, stage 4 (severe): N18.4

## 2018-08-18 HISTORY — DX: Anemia in chronic kidney disease: D63.1

## 2018-08-18 HISTORY — DX: Anemia in chronic kidney disease: N18.6

## 2018-08-18 NOTE — Progress Notes (Signed)
Diagnosis Anemia in stage 4 chronic kidney disease ([place]) - Plan: CBC with Differential/Platelet, CBC with Differential/Platelet, Comprehensive metabolic panel, Lactate dehydrogenase  Staging Cancer Staging No matching staging information was found for the patient.  Assessment and Plan:  1.  Normocytic anemia.  Labs done 08/11/2018 reviewed and showed WBC 5.4 HB 9.5 plts 249,000.  Chemistries WNL with K+ 4 Cr 2.19 and normal LFTs.  Ferritin 188,000. SPEP done  today 04/29/2018 was negative.  Pt has anemia related to chronic renal insufficiency.  Pt should continue to follow-up with nephrology as directed.  Pt will be given trial of procrit 40,000 U every 2 weeks with monthly labs to determine if counts improve with therapy.  She will be seen for follow-up in 11/2018.    2.  Renal insufficiency.  She is followed by nephrology.  Creatinine was 2.19.   She has a normal serum protein electrophoresis.   Follow-up with nephrology as directed.  Pt also reports she is on a clinical trial.  Will determine if any exclusion for pt being on Procrit or Prolia in this study.    3.  Osteoporosis.  Bone density done 08/02/2017 reviewed and  confirmed this.  Pt is not on bisphosphonate therapy.  She is now on calcium and vitamin D.  I have discussed with her option of Prolia 60 mg every 6 months.  She will also the repeat bone density in January 2021.  Side effects of prolia discussed and she was provided written information.  She desires to try Prolia therapy.  Pt should continue calcium and vitamin D.    4.  Hypertension.  Blood pressure 154/48.  Follow-up with PCP.  5.  Health maintenance.  Patient had a colonoscopy in June 2017 and was normal.  Follow-up with GI as recommended.  Mammogram screenings as recommended.  Interval history:  Historical data obtained from note dated 04/29/2018.  78 year old female referred for evaluation of anemia.  She reports she has been told she was anemic for some time.  She denies  any blood in her stool or urine.  She is followed by nephrology at [facility].  She has undergone colonoscopy and mammograms in the past.  Labs done 09/06/2017 showed a white count of 5.8 hemoglobin 10 platelets 244,000 her creatinine was 1.58.  She reports she was diagnosed with osteoporosis but is not on calcium or vitamin D.  She also is not on bisphosphonate therapy.    Current Status:  Pt is seen today for follow-up.  She is here to go over labs.  She reports she has enrolled in a study at [facility].    Problem List Patient Active Problem List   Diagnosis Date Noted  . Anemia in stage 4 chronic kidney disease ([place]) [N18.4, D63.1] 08/18/2018  . Essential hypertension, benign [I10] 01/26/2014  . Palpitations [R00.2] 01/26/2014  . 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 Anemia in stage 4 chronic kidney disease ([place]) 08/18/2018  . Depression   . Essential hypertension, benign   . GERD (gastroesophageal reflux disease)   . Glomerulonephritis   . Gout   . Mixed hyperlipidemia   . Renal insufficiency     Past Surgical History Past Surgical History:  Procedure Laterality Date  . APPENDECTOMY    . CATARACT EXTRACTION    . COLONOSCOPY N/A 12/28/2015   Procedure: COLONOSCOPY;  Surgeon: Rogene [place], MD;  Location: [facility];  Service:  Endoscopy;  Laterality: N/A;  815  . TUBAL LIGATION      Family History Family History  Problem Relation Age of Onset  . CAD Father   . Heart attack Father   . Diabetes Mellitus II Father   . Hypertension Father   . Lupus Brother      Social History  reports that she has never smoked. She has never used smokeless tobacco. She reports that she does not drink alcohol or use drugs.  Medications  Current Outpatient Medications:  .  Calcium Carbonate (CALTRATE 600 PO), Take 1 tablet by mouth., Disp: , Rfl:  .  carvedilol (COREG) 12.5 MG tablet, Take by mouth.,  Disp: , Rfl:  .  ezetimibe (ZETIA) 10 MG tablet, Take by mouth., Disp: , Rfl:  .  furosemide (LASIX) 20 MG tablet, Take by mouth., Disp: , Rfl:  .  hydrALAZINE (APRESOLINE) 25 MG tablet, Take by mouth., Disp: , Rfl:  .  lisinopril (PRINIVIL,ZESTRIL) 20 MG tablet, Take by mouth., Disp: , Rfl:  .  Multiple Vitamin (MULTIVITAMIN) tablet, Take 1 tablet by mouth daily., Disp: , Rfl:  .  pantoprazole (PROTONIX) 40 MG tablet, TAKE 1 TABLET BY MOUTH  DAILY, Disp: , Rfl:  .  simvastatin (ZOCOR) 40 MG tablet, Take 40 mg by mouth daily., Disp: , Rfl:  .  sodium bicarbonate 650 MG tablet, Take 1,300 mg by mouth 2 (two) times daily., Disp: , Rfl: 3 .  VITAMIN D, CHOLECALCIFEROL, PO, Take 1 tablet by mouth., Disp: , Rfl:  .  acetaminophen (TYLENOL) 325 MG tablet, Take by mouth., Disp: , Rfl:   Allergies Sulfa antibiotics  Review of Systems Review of Systems - Oncology ROS negative   Physical Exam  Vitals Wt Readings from Last 3 Encounters:  08/18/18 114 lb (51.7 kg)  05/27/18 115 lb (52.2 kg)  04/29/18 116 lb 4.8 oz (52.8 kg)   Temp Readings from Last 3 Encounters:  08/18/18 98.2 F (36.8 C) (Oral)  05/27/18 98.2 F (36.8 C) (Oral)  04/29/18 97.8 F (36.6 C) (Oral)   BP Readings from Last 3 Encounters:  08/18/18 (!) 154/48  05/27/18 (!) 128/49  04/29/18 (!) 137/43   Pulse Readings from Last 3 Encounters:  08/18/18 60  05/27/18 70  04/29/18 65   Constitutional: Well-developed, well-nourished, and in no distress.   HENT: Head: Normocephalic and atraumatic.  Mouth/Throat: No oropharyngeal exudate. Mucosa moist. Eyes: Pupils are equal, round, and reactive to light. Conjunctivae are normal. No scleral icterus.  Neck: Normal range of motion. Neck supple. No JVD present.  Cardiovascular: Normal rate, regular rhythm and normal heart sounds.  Exam reveals no gallop and no friction rub.   No murmur heard. Pulmonary/Chest: Effort normal and breath sounds normal. No respiratory distress.  No wheezes.No rales.  Abdominal: Soft. Bowel sounds are normal. No distension. There is no tenderness. There is no guarding.  Musculoskeletal: No edema or tenderness.  Lymphadenopathy: No cervical, axillary or supraclavicular adenopathy.  Neurological: Alert and oriented to person, place, and time. No cranial nerve deficit.  Skin: Skin is warm and dry. No rash noted. No erythema. No pallor.  Psychiatric: Affect and judgment normal.   Labs No visits with results within 3 Day(s) from this visit.  Latest known visit with results is:  Hospital Outpatient Visit on 08/11/2018  Component Date Value Ref Range Status  . Sodium 08/11/2018 139  135 - 145 mmol/L Final  . Potassium 08/11/2018 3.8  3.5 - 5.1 mmol/L Final  . Chloride  08/11/2018 107  98 - 111 mmol/L Final  . CO2 08/11/2018 22  22 - 32 mmol/L Final  . Glucose, Bld 08/11/2018 117* 70 - 99 mg/dL Final  . BUN 08/11/2018 52* 8 - 23 mg/dL Final  . Creatinine, Ser 08/11/2018 2.19* 0.44 - 1.00 mg/dL Final  . Calcium 08/11/2018 8.6* 8.9 - 10.3 mg/dL Final  . GFR calc non Af Amer 08/11/2018 21* >60 mL/min Final  . GFR calc Af Amer 08/11/2018 24* >60 mL/min Final  . Anion gap 08/11/2018 10  5 - 15 Final   Performed at [facility], [address] [place], [place] [address]     Pathology Orders Placed This Encounter  Procedures  . CBC with Differential/Platelet    Standing Status:   Standing    Number of Occurrences:   12    Standing Expiration Date:   08/19/2019  . CBC with Differential/Platelet    Standing Status:   Future    Standing Expiration Date:   08/19/2019  . Comprehensive metabolic panel    Standing Status:   Future    Standing Expiration Date:   08/19/2019  . Lactate dehydrogenase    Standing Status:   Future    Standing Expiration Date:   08/19/2019       Zoila Shutter MD

## 2018-08-19 ENCOUNTER — Encounter (HOSPITAL_COMMUNITY): Payer: Self-pay | Admitting: *Deleted

## 2018-08-19 NOTE — Progress Notes (Signed)
I spoke with Lana Fish, RN at [facility] Nephrology Research department of which the patient is a participant in a research study.   She was advised that we would be beginning Procrit.  She said that it is okay to begin procrit as it is not an exclusion criteria with the study.  Should we have any questions about the study in which the patient is a participant, we can call Lana Fish, RN at [phone].  I provided the above information to Dr. Walden Field and I called the patient to inform her as well so that we can begin her treatments here.  Patient verbalizes understanding.

## 2018-08-25 ENCOUNTER — Other Ambulatory Visit (HOSPITAL_COMMUNITY): Payer: Self-pay

## 2018-08-25 DIAGNOSIS — D638 Anemia in other chronic diseases classified elsewhere: Secondary | ICD-10-CM

## 2018-08-25 DIAGNOSIS — D631 Anemia in chronic kidney disease: Secondary | ICD-10-CM

## 2018-08-25 DIAGNOSIS — N184 Chronic kidney disease, stage 4 (severe): Secondary | ICD-10-CM

## 2018-08-26 ENCOUNTER — Encounter (HOSPITAL_COMMUNITY): Payer: Self-pay | Admitting: *Deleted

## 2018-08-26 ENCOUNTER — Inpatient Hospital Stay (HOSPITAL_COMMUNITY): Payer: Medicare Other | Attending: Hematology

## 2018-08-26 ENCOUNTER — Other Ambulatory Visit (HOSPITAL_COMMUNITY): Payer: Self-pay | Admitting: Internal Medicine

## 2018-08-26 ENCOUNTER — Inpatient Hospital Stay (HOSPITAL_COMMUNITY): Payer: Medicare Other

## 2018-08-26 VITALS — BP 128/52 | HR 65 | Temp 97.6°F | Resp 18

## 2018-08-26 DIAGNOSIS — M818 Other osteoporosis without current pathological fracture: Secondary | ICD-10-CM | POA: Insufficient documentation

## 2018-08-26 DIAGNOSIS — N184 Chronic kidney disease, stage 4 (severe): Secondary | ICD-10-CM | POA: Diagnosis present

## 2018-08-26 DIAGNOSIS — D638 Anemia in other chronic diseases classified elsewhere: Secondary | ICD-10-CM

## 2018-08-26 DIAGNOSIS — D631 Anemia in chronic kidney disease: Secondary | ICD-10-CM

## 2018-08-26 LAB — CBC
HCT: 27 % — ABNORMAL LOW (ref 36.0–46.0)
Hemoglobin: 9 g/dL — ABNORMAL LOW (ref 12.0–15.0)
MCH: 31.6 pg (ref 26.0–34.0)
MCHC: 33.3 g/dL (ref 30.0–36.0)
MCV: 94.7 fL (ref 80.0–100.0)
Platelets: 205 10*3/uL (ref 150–400)
RBC: 2.85 MIL/uL — ABNORMAL LOW (ref 3.87–5.11)
RDW: 11.6 % (ref 11.5–15.5)
WBC: 5.6 10*3/uL (ref 4.0–10.5)
nRBC: 0 % (ref 0.0–0.2)

## 2018-08-26 LAB — COMPREHENSIVE METABOLIC PANEL
ALT: 19 U/L (ref 0–44)
AST: 25 U/L (ref 15–41)
Albumin: 3.1 g/dL — ABNORMAL LOW (ref 3.5–5.0)
Alkaline Phosphatase: 43 U/L (ref 38–126)
Anion gap: 7 (ref 5–15)
BUN: 53 mg/dL — ABNORMAL HIGH (ref 8–23)
CO2: 25 mmol/L (ref 22–32)
Calcium: 8.2 mg/dL — ABNORMAL LOW (ref 8.9–10.3)
Chloride: 110 mmol/L (ref 98–111)
Creatinine, Ser: 2.2 mg/dL — ABNORMAL HIGH (ref 0.44–1.00)
GFR calc Af Amer: 24 mL/min — ABNORMAL LOW (ref >60)
GFR calc non Af Amer: 21 mL/min — ABNORMAL LOW (ref >60)
Glucose, Bld: 105 mg/dL — ABNORMAL HIGH (ref 70–99)
Potassium: 3.7 mmol/L (ref 3.5–5.1)
Sodium: 142 mmol/L (ref 135–145)
Total Bilirubin: 0.4 mg/dL (ref 0.3–1.2)
Total Protein: 6.1 g/dL — ABNORMAL LOW (ref 6.5–8.1)

## 2018-08-26 MED ORDER — DENOSUMAB 60 MG/ML ~~LOC~~ SOSY: 60.0000 mg | PREFILLED_SYRINGE | SUBCUTANEOUS | 0 refills | Status: DC

## 2018-08-26 MED ORDER — EPOETIN ALFA 40000 UNIT/ML IJ SOLN
40000.0000 [IU] | Freq: Once | INTRAMUSCULAR | Status: AC
  Administered 2018-08-26: 40000 [IU] via SUBCUTANEOUS
  Filled 2018-08-26: qty 1

## 2018-08-26 MED ORDER — DENOSUMAB 60 MG/ML ~~LOC~~ SOSY
60.0000 mg | PREFILLED_SYRINGE | Freq: Once | SUBCUTANEOUS | Status: AC
  Administered 2018-08-26: 60 mg via SUBCUTANEOUS
  Filled 2018-08-26: qty 1

## 2018-08-26 NOTE — Progress Notes (Signed)
Pt presents today for Prolia and Procrit injection. Pt is part of a study at [facility] reviewed with Dr.Higgs. Nurse at the Nephrology unit handeling participating in the study gave [place] RN authorization to give both injections today. Both injections authorized by our [employer].  Treatment plan updated by pharmacist. [facility] updated and reviewed. New appt schedule printed off and given to pt.   Deyana Wnuk presents today for injection per MD orders. Procrit  administered SQ in right Upper Arm. Administration without incident. Patient tolerated well.  Khadijatou Borak presents today for injection per MD orders. Prolia administered SQ in left Upper Arm. Administration without incident. Patient tolerated well.   Discharged from clinic ambulatory. F/U with [facility] as scheduled.

## 2018-08-26 NOTE — Patient Instructions (Signed)
[facility] at [facility]  Discharge Instructions:   _______________________________________________________________  Thank you for choosing [facility] at [facility] to provide your oncology and hematology care.  To afford each patient quality time with our providers, please arrive at least 15 minutes before your scheduled appointment.  You need to re-schedule your appointment if you arrive 10 or more minutes late.  We strive to give you quality time with our providers, and arriving late affects you and other patients whose appointments are after yours.  Also, if you no show three or more times for appointments you may be dismissed from the clinic.  Again, thank you for choosing  Cancer Center at  Hospital. Our hope is that these requests will allow you access to exceptional care and in a timely manner. _______________________________________________________________  If you have questions after your visit, please contact our office at [phone] between the hours of 8:30 a.m. and 5:00 p.m. Voicemails left after 4:30 p.m. will not be returned until the following business day. _______________________________________________________________  For prescription refill requests, have your pharmacy contact our office. _______________________________________________________________  Recommendations made by the consultant and any test results will be sent to your referring physician. _______________________________________________________________ 

## 2018-08-26 NOTE — Progress Notes (Signed)
spoke with Lana Fish, RN at [facility] Nephrology Research department of which the patient is a participant in a research study.   She was advised that we would be beginning Prolia in addition to Procrit.  She states that it is okay for patient to begin both medications as they are not an exclusion criteria with the study.  Should we have any questions about the study in which the patient is a participant, we can call Lana Fish, RN at [phone].  I provided the above information to Dr. Walden Field.  Patient is here today for both injections.

## 2018-09-11 ENCOUNTER — Other Ambulatory Visit (HOSPITAL_COMMUNITY)
Admission: RE | Admit: 2018-09-11 | Discharge: 2018-09-11 | Disposition: A | Discharge status: Home / Self Care | Payer: Medicare Other | Source: Ambulatory Visit | Attending: Nephrology | Admitting: Nephrology

## 2018-09-11 DIAGNOSIS — N184 Chronic kidney disease, stage 4 (severe): Secondary | ICD-10-CM | POA: Insufficient documentation

## 2018-09-11 LAB — BASIC METABOLIC PANEL
Anion gap: 9 (ref 5–15)
BUN: 65 mg/dL — ABNORMAL HIGH (ref 8–23)
CO2: 23 mmol/L (ref 22–32)
Calcium: 7.8 mg/dL — ABNORMAL LOW (ref 8.9–10.3)
Chloride: 109 mmol/L (ref 98–111)
Creatinine, Ser: 2.23 mg/dL — ABNORMAL HIGH (ref 0.44–1.00)
GFR calc Af Amer: 24 mL/min — ABNORMAL LOW (ref >60)
GFR calc non Af Amer: 20 mL/min — ABNORMAL LOW (ref >60)
Glucose, Bld: 100 mg/dL — ABNORMAL HIGH (ref 70–99)
Potassium: 4.9 mmol/L (ref 3.5–5.1)
Sodium: 141 mmol/L (ref 135–145)

## 2018-09-11 LAB — PHOSPHORUS: Phosphorus: 5.9 mg/dL — ABNORMAL HIGH (ref 2.5–4.6)

## 2018-09-12 LAB — PARATHYROID HORMONE, INTACT (NO CA): PTH: 290 pg/mL — ABNORMAL HIGH (ref 15–65)

## 2018-09-24 ENCOUNTER — Encounter (HOSPITAL_COMMUNITY): Payer: Self-pay

## 2018-09-24 ENCOUNTER — Other Ambulatory Visit: Payer: Self-pay

## 2018-09-24 ENCOUNTER — Other Ambulatory Visit (HOSPITAL_COMMUNITY)
Admission: RE | Admit: 2018-09-24 | Discharge: 2018-09-24 | Disposition: A | Discharge status: Home / Self Care | Payer: Medicare Other | Source: Ambulatory Visit | Attending: Nephrology | Admitting: Nephrology

## 2018-09-24 ENCOUNTER — Inpatient Hospital Stay (HOSPITAL_COMMUNITY): Payer: Medicare Other | Attending: Hematology

## 2018-09-24 ENCOUNTER — Inpatient Hospital Stay (HOSPITAL_COMMUNITY): Payer: Medicare Other

## 2018-09-24 VITALS — BP 150/52

## 2018-09-24 DIAGNOSIS — D631 Anemia in chronic kidney disease: Secondary | ICD-10-CM | POA: Insufficient documentation

## 2018-09-24 DIAGNOSIS — N184 Chronic kidney disease, stage 4 (severe): Secondary | ICD-10-CM

## 2018-09-24 DIAGNOSIS — N059 Unspecified nephritic syndrome with unspecified morphologic changes: Secondary | ICD-10-CM | POA: Insufficient documentation

## 2018-09-24 DIAGNOSIS — D638 Anemia in other chronic diseases classified elsewhere: Secondary | ICD-10-CM

## 2018-09-24 LAB — BASIC METABOLIC PANEL
Anion gap: 7 (ref 5–15)
BUN: 42 mg/dL — ABNORMAL HIGH (ref 8–23)
CO2: 20 mmol/L — ABNORMAL LOW (ref 22–32)
Calcium: 7.8 mg/dL — ABNORMAL LOW (ref 8.9–10.3)
Chloride: 111 mmol/L (ref 98–111)
Creatinine, Ser: 2.2 mg/dL — ABNORMAL HIGH (ref 0.44–1.00)
GFR calc Af Amer: 24 mL/min — ABNORMAL LOW (ref >60)
GFR calc non Af Amer: 21 mL/min — ABNORMAL LOW (ref >60)
Glucose, Bld: 94 mg/dL (ref 70–99)
Potassium: 4.7 mmol/L (ref 3.5–5.1)
Sodium: 138 mmol/L (ref 135–145)

## 2018-09-24 LAB — CBC
HCT: 32 % — ABNORMAL LOW (ref 36.0–46.0)
Hemoglobin: 10.1 g/dL — ABNORMAL LOW (ref 12.0–15.0)
MCH: 30.2 pg (ref 26.0–34.0)
MCHC: 31.6 g/dL (ref 30.0–36.0)
MCV: 95.8 fL (ref 80.0–100.0)
Platelets: 233 10*3/uL (ref 150–400)
RBC: 3.34 MIL/uL — ABNORMAL LOW (ref 3.87–5.11)
RDW: 12.5 % (ref 11.5–15.5)
WBC: 6.4 10*3/uL (ref 4.0–10.5)
nRBC: 0 % (ref 0.0–0.2)

## 2018-09-24 MED ORDER — EPOETIN ALFA 40000 UNIT/ML IJ SOLN
INTRAMUSCULAR | Status: AC
  Filled 2018-09-24: qty 1

## 2018-09-24 MED ORDER — EPOETIN ALFA 40000 UNIT/ML IJ SOLN
40000.0000 [IU] | Freq: Once | INTRAMUSCULAR | Status: AC
  Administered 2018-09-24: 40000 [IU] via SUBCUTANEOUS
  Filled 2018-09-24: qty 1

## 2018-09-24 NOTE — Patient Instructions (Signed)
[facility] at [facility]  Discharge Instructions:   _______________________________________________________________  Thank you for choosing [facility] at [facility] to provide your oncology and hematology care.  To afford each patient quality time with our providers, please arrive at least 15 minutes before your scheduled appointment.  You need to re-schedule your appointment if you arrive 10 or more minutes late.  We strive to give you quality time with our providers, and arriving late affects you and other patients whose appointments are after yours.  Also, if you no show three or more times for appointments you may be dismissed from the clinic.  Again, thank you for choosing [facility] at [facility]. Our hope is that these requests will allow you access to exceptional care and in a timely manner. _______________________________________________________________  If you have questions after your visit, please contact our office at [phone] between the hours of 8:30 a.m. and 5:00 p.m. Voicemails left after 4:30 p.m. will not be returned until the following business day. _______________________________________________________________  For prescription refill requests, have your pharmacy contact our office. _______________________________________________________________  Recommendations made by the consultant and any test results will be sent to your referring physician. _______________________________________________________________ 

## 2018-09-24 NOTE — Progress Notes (Signed)
Felicia Williams presents today for injection per MD orders. Procrit 40,000 mcg administered SQ in left Upper Arm. Administration without incident. Patient tolerated well.  Vitals stable and discharged home from clinic ambulatory. Follow up as scheduled.

## 2018-10-08 ENCOUNTER — Inpatient Hospital Stay (HOSPITAL_COMMUNITY): Payer: Medicare Other

## 2018-10-08 ENCOUNTER — Other Ambulatory Visit: Payer: Self-pay

## 2018-10-08 ENCOUNTER — Encounter (HOSPITAL_COMMUNITY): Payer: Self-pay | Admitting: *Deleted

## 2018-10-08 DIAGNOSIS — D638 Anemia in other chronic diseases classified elsewhere: Secondary | ICD-10-CM

## 2018-10-08 DIAGNOSIS — N184 Chronic kidney disease, stage 4 (severe): Secondary | ICD-10-CM | POA: Diagnosis not present

## 2018-10-08 DIAGNOSIS — D631 Anemia in chronic kidney disease: Secondary | ICD-10-CM

## 2018-10-08 LAB — CBC
HCT: 35.5 % — ABNORMAL LOW (ref 36.0–46.0)
Hemoglobin: 11.3 g/dL — ABNORMAL LOW (ref 12.0–15.0)
MCH: 30.6 pg (ref 26.0–34.0)
MCHC: 31.8 g/dL (ref 30.0–36.0)
MCV: 96.2 fL (ref 80.0–100.0)
Platelets: 241 10*3/uL (ref 150–400)
RBC: 3.69 MIL/uL — ABNORMAL LOW (ref 3.87–5.11)
RDW: 13.2 % (ref 11.5–15.5)
WBC: 5 10*3/uL (ref 4.0–10.5)
nRBC: 0 % (ref 0.0–0.2)

## 2018-10-08 NOTE — Progress Notes (Signed)
Hemoglobin 11.3. no procrit needed today per protocol.   discharged home from clinic ambulatory. Follow up as scheduled.

## 2018-10-08 NOTE — Progress Notes (Addendum)
Patient called clinic stating that a month ago her prescription for prolia was sent to the pharmacy and she picked it up. She paid over $300 for it. She has kept it in the refrigerator. She states that she doesn't know what to do with it.  I called and spoke with the pharmacist and he states that since it has been an extended amount of time he can not take the medication back.    I notified patient and told her to keep the prolia in the refrigerator and bring it with her when she comes for her appointment in August. She verbalizes understanding.

## 2018-10-24 ENCOUNTER — Other Ambulatory Visit: Payer: Self-pay

## 2018-10-24 ENCOUNTER — Inpatient Hospital Stay (HOSPITAL_COMMUNITY): Payer: Medicare Other | Attending: Hematology

## 2018-10-24 ENCOUNTER — Ambulatory Visit (HOSPITAL_COMMUNITY): Payer: Medicare Other

## 2018-10-24 ENCOUNTER — Other Ambulatory Visit (HOSPITAL_COMMUNITY): Payer: Medicare Other

## 2018-10-24 ENCOUNTER — Inpatient Hospital Stay (HOSPITAL_COMMUNITY): Payer: Medicare Other

## 2018-10-24 DIAGNOSIS — N184 Chronic kidney disease, stage 4 (severe): Secondary | ICD-10-CM | POA: Diagnosis present

## 2018-10-24 DIAGNOSIS — D631 Anemia in chronic kidney disease: Secondary | ICD-10-CM | POA: Diagnosis present

## 2018-10-24 DIAGNOSIS — D638 Anemia in other chronic diseases classified elsewhere: Secondary | ICD-10-CM

## 2018-10-24 LAB — CBC
HCT: 34.7 % — ABNORMAL LOW (ref 36.0–46.0)
Hemoglobin: 11 g/dL — ABNORMAL LOW (ref 12.0–15.0)
MCH: 29.7 pg (ref 26.0–34.0)
MCHC: 31.7 g/dL (ref 30.0–36.0)
MCV: 93.8 fL (ref 80.0–100.0)
Platelets: 261 10*3/uL (ref 150–400)
RBC: 3.7 MIL/uL — ABNORMAL LOW (ref 3.87–5.11)
RDW: 13.2 % (ref 11.5–15.5)
WBC: 5.5 10*3/uL (ref 4.0–10.5)
nRBC: 0 % (ref 0.0–0.2)

## 2018-10-24 MED ORDER — EPOETIN ALFA 40000 UNIT/ML IJ SOLN
INTRAMUSCULAR | Status: AC
  Filled 2018-10-24: qty 1

## 2018-10-24 MED ORDER — EPOETIN ALFA 40000 UNIT/ML IJ SOLN: 40000.0000 [IU] | Freq: Once | INTRAMUSCULAR | Status: DC

## 2018-10-24 NOTE — Progress Notes (Signed)
Patient's hemoglobin today was 11.0 which is within parameters; however, the pharmacist and physician want to hold treatment and then will dose reduce patient on next visit in 2 weeks.  Patient given copy of her labwork and up coming schedule.  She was discharged ambulatory and in stable condition from clinic.

## 2018-10-24 NOTE — Patient Instructions (Addendum)
[place] at [facility] Discharge Instructions  You were here today for your procrit.  Your hemoglobin was 11.0, so we held your injection.  Follow up in 2 weeks as already scheduled.   Thank you for choosing [place] at [facility] to provide your oncology and hematology care.  To afford each patient quality time with our provider, please arrive at least 15 minutes before your scheduled appointment time.   If you have a lab appointment with the [place] please come in thru the  Main Entrance and check in at the main information desk  You need to re-schedule your appointment should you arrive 10 or more minutes late.  We strive to give you quality time with our providers, and arriving late affects you and other patients whose appointments are after yours.  Also, if you no show three or more times for appointments you may be dismissed from the clinic at the providers discretion.     Again, thank you for choosing [facility].  Our hope is that these requests will decrease the amount of time that you wait before being seen by our physicians.       _____________________________________________________________  Should you have questions after your visit to [facility], please contact our office at [phone] between the hours of 8:00 a.m. and 4:30 p.m.  Voicemails left after 4:00 p.m. will not be returned until the following business day.  For prescription refill requests, have your pharmacy contact our office and allow 72 hours.    Cancer Center Support Programs:   > Cancer Support Group  2nd Tuesday of the month 1pm-2pm, Journey Room

## 2018-11-07 ENCOUNTER — Inpatient Hospital Stay (HOSPITAL_COMMUNITY): Payer: Medicare Other

## 2018-11-07 ENCOUNTER — Other Ambulatory Visit: Payer: Self-pay

## 2018-11-07 VITALS — BP 169/66 | HR 56 | Temp 97.8°F | Resp 18

## 2018-11-07 DIAGNOSIS — N184 Chronic kidney disease, stage 4 (severe): Secondary | ICD-10-CM

## 2018-11-07 DIAGNOSIS — D631 Anemia in chronic kidney disease: Secondary | ICD-10-CM

## 2018-11-07 DIAGNOSIS — D638 Anemia in other chronic diseases classified elsewhere: Secondary | ICD-10-CM

## 2018-11-07 LAB — CBC
HCT: 33 % — ABNORMAL LOW (ref 36.0–46.0)
Hemoglobin: 10.4 g/dL — ABNORMAL LOW (ref 12.0–15.0)
MCH: 29.7 pg (ref 26.0–34.0)
MCHC: 31.5 g/dL (ref 30.0–36.0)
MCV: 94.3 fL (ref 80.0–100.0)
Platelets: 235 10*3/uL (ref 150–400)
RBC: 3.5 MIL/uL — ABNORMAL LOW (ref 3.87–5.11)
RDW: 13.3 % (ref 11.5–15.5)
WBC: 7.1 10*3/uL (ref 4.0–10.5)
nRBC: 0 % (ref 0.0–0.2)

## 2018-11-07 MED ORDER — EPOETIN ALFA 20000 UNIT/ML IJ SOLN
INTRAMUSCULAR | Status: AC
  Filled 2018-11-07: qty 1

## 2018-11-07 MED ORDER — EPOETIN ALFA 20000 UNIT/ML IJ SOLN
20000.0000 [IU] | Freq: Once | INTRAMUSCULAR | Status: AC
  Administered 2018-11-07: 11:00:00 20000 [IU] via SUBCUTANEOUS

## 2018-11-07 NOTE — Patient Instructions (Signed)
[facility] at [facility]  Discharge Instructions:   _______________________________________________________________  Thank you for choosing [facility] at [facility] to provide your oncology and hematology care.  To afford each patient quality time with our providers, please arrive at least 15 minutes before your scheduled appointment.  You need to re-schedule your appointment if you arrive 10 or more minutes late.  We strive to give you quality time with our providers, and arriving late affects you and other patients whose appointments are after yours.  Also, if you no show three or more times for appointments you may be dismissed from the clinic.  Again, thank you for choosing [facility] [facility]. Our hope is that these requests will allow you access to exceptional care and in a timely manner. _______________________________________________________________  If you have questions after your visit, please contact our office at [phone] between the hours of 8:30 a.m. and 5:00 p.m. Voicemails left after 4:30 p.m. will not be returned until the following business day. _______________________________________________________________  For prescription refill requests, have your pharmacy contact our office. _______________________________________________________________  Recommendations made by the consultant and any test results will be sent to your referring physician. _______________________________________________________________ 

## 2018-11-07 NOTE — Progress Notes (Signed)
Procrit given per orders. Patient tolerated it well without problems. Vitals stable and discharged home from clinic ambulatory. Follow up as scheduled.  

## 2018-11-24 ENCOUNTER — Inpatient Hospital Stay (HOSPITAL_COMMUNITY): Payer: Medicare Other

## 2018-11-24 ENCOUNTER — Other Ambulatory Visit: Payer: Self-pay

## 2018-11-24 ENCOUNTER — Inpatient Hospital Stay (HOSPITAL_COMMUNITY): Payer: Medicare Other | Attending: Hematology

## 2018-11-24 VITALS — BP 155/67 | HR 74 | Temp 98.0°F | Resp 18

## 2018-11-24 DIAGNOSIS — D638 Anemia in other chronic diseases classified elsewhere: Secondary | ICD-10-CM

## 2018-11-24 DIAGNOSIS — D631 Anemia in chronic kidney disease: Secondary | ICD-10-CM

## 2018-11-24 DIAGNOSIS — N184 Chronic kidney disease, stage 4 (severe): Secondary | ICD-10-CM | POA: Insufficient documentation

## 2018-11-24 LAB — CBC
HCT: 33.1 % — ABNORMAL LOW (ref 36.0–46.0)
Hemoglobin: 10.5 g/dL — ABNORMAL LOW (ref 12.0–15.0)
MCH: 29.9 pg (ref 26.0–34.0)
MCHC: 31.7 g/dL (ref 30.0–36.0)
MCV: 94.3 fL (ref 80.0–100.0)
Platelets: 246 10*3/uL (ref 150–400)
RBC: 3.51 MIL/uL — ABNORMAL LOW (ref 3.87–5.11)
RDW: 14.5 % (ref 11.5–15.5)
WBC: 4.5 10*3/uL (ref 4.0–10.5)
nRBC: 0 % (ref 0.0–0.2)

## 2018-11-24 MED ORDER — EPOETIN ALFA 20000 UNIT/ML IJ SOLN
20000.0000 [IU] | Freq: Once | INTRAMUSCULAR | Status: AC
  Administered 2018-11-24: 20000 [IU] via SUBCUTANEOUS

## 2018-11-24 MED ORDER — EPOETIN ALFA 20000 UNIT/ML IJ SOLN
INTRAMUSCULAR | Status: AC
  Filled 2018-11-24: qty 1

## 2018-11-24 NOTE — Progress Notes (Signed)
Felicia Williams tolerated Procrit injection without incident or complaint. Hgb 10.5. Discharged self ambulatory in satisfactory condition.

## 2018-11-24 NOTE — Patient Instructions (Signed)
[place] at [facility]  Discharge Instructions:  Procrit injection received today. _______________________________________________________________  Thank you for choosing [place] at [facility] to provide your oncology and hematology care.  To afford each patient quality time with our providers, please arrive at least 15 minutes before your scheduled appointment.  You need to re-schedule your appointment if you arrive 10 or more minutes late.  We strive to give you quality time with our providers, and arriving late affects you and other patients whose appointments are after yours.  Also, if you no show three or more times for appointments you may be dismissed from the clinic.  Again, thank you for choosing [place] at [place] hope is that these requests will allow you access to exceptional care and in a timely manner. _______________________________________________________________  If you have questions after your visit, please contact our office at [phone] between the hours of 8:30 a.m. and 5:00 p.m. Voicemails left after 4:30 p.m. will not be returned until the following business day. _______________________________________________________________  For prescription refill requests, have your pharmacy contact our office. _______________________________________________________________  Recommendations made by the consultant and any test results will be sent to your referring physician. _______________________________________________________________

## 2018-11-26 ENCOUNTER — Other Ambulatory Visit: Payer: Self-pay

## 2018-11-26 ENCOUNTER — Other Ambulatory Visit (HOSPITAL_COMMUNITY)
Admission: RE | Admit: 2018-11-26 | Discharge: 2018-11-26 | Disposition: A | Discharge status: Home / Self Care | Payer: Medicare Other | Source: Ambulatory Visit | Attending: Nephrology | Admitting: Nephrology

## 2018-11-26 DIAGNOSIS — N059 Unspecified nephritic syndrome with unspecified morphologic changes: Secondary | ICD-10-CM | POA: Insufficient documentation

## 2018-11-26 LAB — PROTEIN / CREATININE RATIO, URINE
Creatinine, Urine: 56.5 mg/dL
Protein Creatinine Ratio: 4.19 mg/mg{Cre} — ABNORMAL HIGH (ref 0.00–0.15)
Total Protein, Urine: 237 mg/dL

## 2018-11-26 LAB — PHOSPHORUS: Phosphorus: 5.4 mg/dL — ABNORMAL HIGH (ref 2.5–4.6)

## 2018-11-26 LAB — BASIC METABOLIC PANEL
Anion gap: 10 (ref 5–15)
BUN: 67 mg/dL — ABNORMAL HIGH (ref 8–23)
CO2: 18 mmol/L — ABNORMAL LOW (ref 22–32)
Calcium: 8.5 mg/dL — ABNORMAL LOW (ref 8.9–10.3)
Chloride: 116 mmol/L — ABNORMAL HIGH (ref 98–111)
Creatinine, Ser: 2.91 mg/dL — ABNORMAL HIGH (ref 0.44–1.00)
GFR calc Af Amer: 17 mL/min — ABNORMAL LOW (ref >60)
GFR calc non Af Amer: 15 mL/min — ABNORMAL LOW (ref >60)
Glucose, Bld: 107 mg/dL — ABNORMAL HIGH (ref 70–99)
Potassium: 4.9 mmol/L (ref 3.5–5.1)
Sodium: 144 mmol/L (ref 135–145)

## 2018-11-27 LAB — PARATHYROID HORMONE, INTACT (NO CA): PTH: 69 pg/mL — ABNORMAL HIGH (ref 15–65)

## 2018-12-08 ENCOUNTER — Inpatient Hospital Stay (HOSPITAL_COMMUNITY): Payer: Medicare Other

## 2018-12-08 ENCOUNTER — Other Ambulatory Visit: Payer: Self-pay

## 2018-12-08 VITALS — BP 125/87 | HR 77 | Temp 98.0°F | Resp 18

## 2018-12-08 DIAGNOSIS — N184 Chronic kidney disease, stage 4 (severe): Secondary | ICD-10-CM | POA: Diagnosis not present

## 2018-12-08 DIAGNOSIS — D631 Anemia in chronic kidney disease: Secondary | ICD-10-CM

## 2018-12-08 DIAGNOSIS — D638 Anemia in other chronic diseases classified elsewhere: Secondary | ICD-10-CM

## 2018-12-08 LAB — CBC
HCT: 34.4 % — ABNORMAL LOW (ref 36.0–46.0)
Hemoglobin: 10.7 g/dL — ABNORMAL LOW (ref 12.0–15.0)
MCH: 29.6 pg (ref 26.0–34.0)
MCHC: 31.1 g/dL (ref 30.0–36.0)
MCV: 95.3 fL (ref 80.0–100.0)
Platelets: 247 10*3/uL (ref 150–400)
RBC: 3.61 MIL/uL — ABNORMAL LOW (ref 3.87–5.11)
RDW: 14.8 % (ref 11.5–15.5)
WBC: 7.1 10*3/uL (ref 4.0–10.5)
nRBC: 0 % (ref 0.0–0.2)

## 2018-12-08 MED ORDER — EPOETIN ALFA 20000 UNIT/ML IJ SOLN
20000.0000 [IU] | Freq: Once | INTRAMUSCULAR | Status: AC
  Administered 2018-12-08: 20000 [IU] via SUBCUTANEOUS
  Filled 2018-12-08: qty 1

## 2018-12-08 NOTE — Progress Notes (Signed)
Patient tolerated injection with no complaints voiced.  Site clean and dry with no bruising or swelling noted at site.  Band aid applied.  Vss with discharge and left ambulatory with no s/s of distress noted.  

## 2018-12-24 ENCOUNTER — Other Ambulatory Visit: Payer: Self-pay

## 2018-12-25 ENCOUNTER — Inpatient Hospital Stay (HOSPITAL_COMMUNITY): Payer: Medicare Other

## 2018-12-25 ENCOUNTER — Inpatient Hospital Stay (HOSPITAL_COMMUNITY): Payer: Medicare Other | Attending: Hematology | Admitting: Hematology

## 2018-12-25 ENCOUNTER — Other Ambulatory Visit: Payer: Self-pay

## 2018-12-25 ENCOUNTER — Encounter (HOSPITAL_COMMUNITY): Payer: Self-pay | Admitting: Hematology

## 2018-12-25 ENCOUNTER — Inpatient Hospital Stay (HOSPITAL_COMMUNITY): Payer: Medicare Other | Attending: Hematology

## 2018-12-25 VITALS — BP 146/61 | HR 66 | Temp 97.6°F | Resp 16 | Wt 124.2 lb

## 2018-12-25 DIAGNOSIS — E782 Mixed hyperlipidemia: Secondary | ICD-10-CM | POA: Insufficient documentation

## 2018-12-25 DIAGNOSIS — D631 Anemia in chronic kidney disease: Secondary | ICD-10-CM | POA: Diagnosis present

## 2018-12-25 DIAGNOSIS — M069 Rheumatoid arthritis, unspecified: Secondary | ICD-10-CM | POA: Diagnosis not present

## 2018-12-25 DIAGNOSIS — N184 Chronic kidney disease, stage 4 (severe): Secondary | ICD-10-CM | POA: Insufficient documentation

## 2018-12-25 DIAGNOSIS — I129 Hypertensive chronic kidney disease with stage 1 through stage 4 chronic kidney disease, or unspecified chronic kidney disease: Secondary | ICD-10-CM | POA: Insufficient documentation

## 2018-12-25 LAB — CBC WITH DIFFERENTIAL/PLATELET
Abs Immature Granulocytes: 0.07 10*3/uL (ref 0.00–0.07)
Basophils Absolute: 0 10*3/uL (ref 0.0–0.1)
Basophils Relative: 0 %
Eosinophils Absolute: 0.1 10*3/uL (ref 0.0–0.5)
Eosinophils Relative: 1 %
HCT: 35.1 % — ABNORMAL LOW (ref 36.0–46.0)
Hemoglobin: 11.1 g/dL — ABNORMAL LOW (ref 12.0–15.0)
Immature Granulocytes: 1 %
Lymphocytes Relative: 7 %
Lymphs Abs: 0.6 10*3/uL — ABNORMAL LOW (ref 0.7–4.0)
MCH: 29.8 pg (ref 26.0–34.0)
MCHC: 31.6 g/dL (ref 30.0–36.0)
MCV: 94.1 fL (ref 80.0–100.0)
Monocytes Absolute: 0.6 10*3/uL (ref 0.1–1.0)
Monocytes Relative: 7 %
Neutro Abs: 7.2 10*3/uL (ref 1.7–7.7)
Neutrophils Relative %: 84 %
Platelets: 295 10*3/uL (ref 150–400)
RBC: 3.73 MIL/uL — ABNORMAL LOW (ref 3.87–5.11)
RDW: 14.5 % (ref 11.5–15.5)
WBC: 8.5 10*3/uL (ref 4.0–10.5)
nRBC: 0 % (ref 0.0–0.2)

## 2018-12-25 LAB — IRON AND TIBC
Iron: 100 ug/dL (ref 28–170)
Saturation Ratios: 44 % — ABNORMAL HIGH (ref 10.4–31.8)
TIBC: 227 ug/dL — ABNORMAL LOW (ref 250–450)
UIBC: 127 ug/dL

## 2018-12-25 LAB — LACTATE DEHYDROGENASE: LDH: 208 U/L — ABNORMAL HIGH (ref 98–192)

## 2018-12-25 LAB — FERRITIN: Ferritin: 142 ng/mL (ref 11–307)

## 2018-12-25 NOTE — Patient Instructions (Signed)
[employer] at [facility] Discharge Instructions  You were seen today by Dr. Delton Coombes. He went over your recent lab results. He will see you back in 3 weeks for labs, procrit.    Thank you for choosing [place] at [facility] to provide your oncology and hematology care.  To afford each patient quality time with our provider, please arrive at least 15 minutes before your scheduled appointment time.   If you have a lab appointment with the [place] please come in thru the  Main Entrance and check in at the main information desk  You need to re-schedule your appointment should you arrive 10 or more minutes late.  We strive to give you quality time with our providers, and arriving late affects you and other patients whose appointments are after yours.  Also, if you no show three or more times for appointments you may be dismissed from the clinic at the providers discretion.     Again, thank you for choosing [facility].  Our hope is that these requests will decrease the amount of time that you wait before being seen by our physicians.       _____________________________________________________________  Should you have questions after your visit to [facility] please contact our office at [phone] between the hours of 8:00 a.m. and 4:30 p.m.  Voicemails left after 4:00 p.m. will not be returned until the following business day.  For prescription refill requests, have your pharmacy contact our office and allow 72 hours.    Cancer Center Support Programs:   > Cancer Support Group  2nd Tuesday of the month 1pm-2pm, Journey Room

## 2018-12-25 NOTE — Progress Notes (Signed)
Pt presents today for Procrit. HGB 11.1. NO injection given today. No complaints at this time. Discharged from clinic ambulatory. F/U with [facility] as scheduled.

## 2018-12-25 NOTE — Progress Notes (Signed)
For review.  Please arrange follow-up with Dr. Raliegh Ip or Lala Lund

## 2018-12-25 NOTE — Assessment & Plan Note (Signed)
1.  Normocytic anemia: - She denies any bleeding per rectum or melena. - Normocytic anemia secondary to relative iron deficiency and CKD. - Procrit was started on 08/26/2018 at 40,000 units every 2 weeks, dose decreased to 20,000 units on 11/07/2018. Hemoglobin improved from 9 to11.1. - Today's ferritin is 142.  This is down from 188 in January.  I have recommended Feraheme infusion.  We discussed the side effects in detail including rare chance of anaphylactic reaction. - I will change her Procrit to 20,000 units every 3 weeks. -I will see her back in 6 weeks for follow-up.  2.  Osteoporosis: - Prolia was started on 08/26/2018.  She tolerated first dose very well.  He is taking calcium and vitamin D supplements.  3.  Rheumatoid arthritis: -He follows up with a specialist at [facility].  She was reportedly started on a steroid pack few days ago which improved her back pain.

## 2018-12-25 NOTE — Progress Notes (Signed)
[place], [place] [address]   CLINIC:  Medical Oncology/Hematology  PCP:  Octavio Graves, DO [address] [place] [address] [phone]   REASON FOR VISIT:  Follow-up for Normocytic anemia    INTERVAL HISTORY:  Ms. Hendel 78 y.o. female returns for routine follow-up. She is here today alone. She states that she has been diagnosed with rheumatoid arthritis since her last visit. Denies any nausea, vomiting, or diarrhea.  She reports shortness of breath on exertion.  Denies any new pains. Had not noticed any recent bleeding such as epistaxis, hematuria or hematochezia. Denies recent chest pain on exertion,  pre-syncopal episodes, or palpitations. Denies any numbness or tingling in hands or feet. Denies any recent fevers, infections, or recent hospitalizations. Patient reports appetite at 100% and energy level at 25%.   REVIEW OF SYSTEMS:  Review of Systems  Respiratory: Positive for shortness of breath.   Gastrointestinal: Positive for diarrhea.  Neurological: Positive for dizziness.  All other systems reviewed and are negative.    PAST MEDICAL/SURGICAL HISTORY:  Past Medical History:  Diagnosis Date  . Anemia in stage 4 chronic kidney disease ([place]) 08/18/2018  . Depression   . Essential hypertension, benign   . GERD (gastroesophageal reflux disease)   . Glomerulonephritis   . Gout   . Mixed hyperlipidemia   . Renal insufficiency    Past Surgical History:  Procedure Laterality Date  . APPENDECTOMY    . CATARACT EXTRACTION    . COLONOSCOPY N/A 12/28/2015   Procedure: COLONOSCOPY;  Surgeon: Rogene Houston, MD;  Location: [facility];  Service: Endoscopy;  Laterality: N/A;  815  . TUBAL LIGATION       SOCIAL HISTORY:  Social History   Socioeconomic History  . Marital status: Single    Spouse name: Not on file  . Number of children: Not on file  . Years of education: Not on file  . Highest education level: Not on  file  Occupational History  . Not on file  Social Needs  . Financial resource strain: Not on file  . Food insecurity:    Worry: Not on file    Inability: Not on file  . Transportation needs:    Medical: Not on file    Non-medical: Not on file  Tobacco Use  . Smoking status: Never Smoker  . Smokeless tobacco: Never Used  Substance and Sexual Activity  . Alcohol use: No  . Drug use: No  . Sexual activity: Not Currently  Lifestyle  . Physical activity:    Days per week: Not on file    Minutes per session: Not on file  . Stress: Not on file  Relationships  . Social connections:    Talks on phone: Not on file    Gets together: Not on file    Attends religious service: Not on file    Active member of club or organization: Not on file    Attends meetings of clubs or organizations: Not on file    Relationship status: Not on file  . Intimate partner violence:    Fear of current or ex partner: Not on file    Emotionally abused: Not on file    Physically abused: Not on file    Forced sexual activity: Not on file  Other Topics Concern  . Not on file  Social History Narrative  . Not on file    FAMILY HISTORY:  Family History  Problem Relation Age of Onset  . CAD Father   . Heart attack Father   . Diabetes Mellitus II Father   . Hypertension Father   . Lupus Brother     CURRENT MEDICATIONS:  Outpatient Encounter Medications as of 12/25/2018  Medication Sig  . acetaminophen (TYLENOL) 325 MG tablet Take by mouth.  . Calcium Carbonate (CALTRATE 600 PO) Take 1 tablet by mouth.  . carvedilol (COREG) 12.5 MG tablet Take by mouth.  . denosumab (PROLIA) 60 MG/ML SOSY injection Inject 60 mg into the skin every 6 (six) months.  . ezetimibe (ZETIA) 10 MG tablet Take by mouth.  . furosemide (LASIX) 40 MG tablet Take by mouth.  . hydrALAZINE (APRESOLINE) 50 MG tablet Take by mouth.  Marland Kitchen HYDROcodone-acetaminophen (NORCO/VICODIN) 5-325 MG tablet Take 1 tablet by mouth 3 (three) times  daily.  Marland Kitchen lisinopril (PRINIVIL,ZESTRIL) 20 MG tablet Take by mouth.  . Multiple Vitamin (MULTIVITAMIN) tablet Take 1 tablet by mouth daily.  . pantoprazole (PROTONIX) 40 MG tablet TAKE 1 TABLET BY MOUTH  DAILY  . simvastatin (ZOCOR) 40 MG tablet Take 40 mg by mouth daily.  . sodium bicarbonate 650 MG tablet Take 1,300 mg by mouth 2 (two) times daily.  Marland Kitchen VITAMIN D, CHOLECALCIFEROL, PO Take 1 tablet by mouth.   No facility-administered encounter medications on file as of 12/25/2018.     ALLERGIES:  Allergies  Allergen Reactions  . Sulfa Antibiotics Other (See Comments)    Shut pt's kidneys down     PHYSICAL EXAM:  ECOG Performance status: 1  Vitals:   12/25/18 1100  BP: (!) 146/61  Pulse: 66  Resp: 16  Temp: 97.6 F (36.4 C)  SpO2: 99%   Filed Weights   12/25/18 1100  Weight: 124 lb 3 oz (56.3 kg)    Physical Exam Vitals signs reviewed.  Constitutional:      Appearance: Normal appearance.  Cardiovascular:     Rate and Rhythm: Normal rate and regular rhythm.     Heart sounds: Normal heart sounds.  Pulmonary:     Effort: Pulmonary effort is normal.     Breath sounds: Normal breath sounds.  Abdominal:     General: There is no distension.     Palpations: Abdomen is soft. There is no mass.  Musculoskeletal:        General: No swelling.  Skin:    General: Skin is warm.  Neurological:     General: No focal deficit present.     Mental Status: She is alert and oriented to person, place, and time.  Psychiatric:        Mood and Affect: Mood normal.        Behavior: Behavior normal.      LABORATORY DATA:  I have reviewed the labs as listed.  CBC    Component Value Date/Time   WBC 8.5 12/25/2018 1027   RBC 3.73 (L) 12/25/2018 1027   HGB 11.1 (L) 12/25/2018 1027   HCT 35.1 (L) 12/25/2018 1027   PLT 295 12/25/2018 1027   MCV 94.1 12/25/2018 1027   MCH 29.8 12/25/2018 1027   MCHC 31.6 12/25/2018 1027   RDW 14.5 12/25/2018 1027   LYMPHSABS 0.6 (L) 12/25/2018  1027   MONOABS 0.6 12/25/2018 1027   EOSABS 0.1 12/25/2018 1027   BASOSABS 0.0 12/25/2018 1027   CMP Latest Ref Rng & Units 11/26/2018 09/24/2018 09/11/2018  Glucose 70 - 99 mg/dL 107(H) 94 100(H)  BUN 8 - 23 mg/dL 67(H) 42(H) 65(H)  Creatinine 0.44 - 1.00 mg/dL 2.91(H) 2.20(H) 2.23(H)  Sodium 135 - 145 mmol/L 144 138 141  Potassium 3.5 - 5.1 mmol/L 4.9 4.7 4.9  Chloride 98 - 111 mmol/L 116(H) 111 109  CO2 22 - 32 mmol/L 18(L) 20(L) 23  Calcium 8.9 - 10.3 mg/dL 8.5(L) 7.8(L) 7.8(L)  Total Protein 6.5 - 8.1 g/dL - - -  Total Bilirubin 0.3 - 1.2 mg/dL - - -  Alkaline Phos 38 - 126 U/L - - -  AST 15 - 41 U/L - - -  ALT 0 - 44 U/L - - -       DIAGNOSTIC IMAGING:  I have independently reviewed the scans and discussed with the patient.   I have reviewed Venita Lick LPN's note and agree with the documentation.  I personally performed a face-to-face visit, made revisions and my assessment and plan is as follows.    ASSESSMENT & PLAN:   Anemia in stage 4 chronic kidney disease ([place]) 1.  Normocytic anemia: - She denies any bleeding per rectum or melena. - Normocytic anemia secondary to relative iron deficiency and CKD. - Procrit was started on 08/26/2018 at 40,000 units every 2 weeks, dose decreased to 20,000 units on 11/07/2018. Hemoglobin improved from 9 to11.1. - Today's ferritin is 142.  This is down from 188 in January.  I have recommended Feraheme infusion.  We discussed the side effects in detail including rare chance of anaphylactic reaction. - I will change her Procrit to 20,000 units every 3 weeks. -I will see her back in 6 weeks for follow-up.  2.  Osteoporosis: - Prolia was started on 08/26/2018.  She tolerated first dose very well.  He is taking calcium and vitamin D supplements.  3.  Rheumatoid arthritis: -He follows up with a specialist at [facility].  She was reportedly started on a steroid pack few days ago which improved her back pain.  Total time spent is 25 minutes  with more than 50% of the time spent face-to-face discussing treatment plan and coordination of care.    Orders placed this encounter:  Orders Placed This Encounter  Procedures  . CBC with Differential/Platelet  . Comprehensive metabolic panel  . Iron and TIBC  . Ferritin  . Iron and TIBC  . Ferritin      Derek Jack, MD [place] [phone]

## 2019-01-05 ENCOUNTER — Ambulatory Visit (HOSPITAL_COMMUNITY): Payer: Medicare Other

## 2019-01-05 ENCOUNTER — Other Ambulatory Visit: Payer: Self-pay

## 2019-01-05 ENCOUNTER — Inpatient Hospital Stay (HOSPITAL_COMMUNITY): Payer: Medicare Other

## 2019-01-05 VITALS — BP 176/71 | HR 84 | Temp 97.8°F | Resp 18

## 2019-01-05 DIAGNOSIS — N184 Chronic kidney disease, stage 4 (severe): Secondary | ICD-10-CM

## 2019-01-05 DIAGNOSIS — N179 Acute kidney failure, unspecified: Secondary | ICD-10-CM | POA: Diagnosis not present

## 2019-01-05 DIAGNOSIS — D631 Anemia in chronic kidney disease: Secondary | ICD-10-CM

## 2019-01-05 MED ORDER — SODIUM CHLORIDE 0.9 % IV SOLN
510.0000 mg | Freq: Once | INTRAVENOUS | Status: AC
  Administered 2019-01-05: 510 mg via INTRAVENOUS
  Filled 2019-01-05: qty 510

## 2019-01-05 MED ORDER — SODIUM CHLORIDE 0.9 % IV SOLN
INTRAVENOUS | Status: DC
  Administered 2019-01-05: 13:00:00 via INTRAVENOUS

## 2019-01-05 NOTE — Progress Notes (Signed)
To treatment room for iron infusion.  Written information given and reviewed before infusion. All questions asked and answered.    Patient tolerated iron infusion with no complaints voiced.  Pre blood pressure 165/62 and post 185/70.  Patient stated after infusion [facility] increased her hydralazine dose for lunchtime but skipped lunch today and did not take her medication.  Dr. Delton Coombes notified.  Patient only stated "light headedness" but no other complaints voiced.  No s/s of distress noted. No orders received per Dr. Delton Coombes.  Ok to go home verbal order Dr. Delton Coombes.    Patient tolerated iron infusion with no complaints voiced.  Peripheral IV site clean and dry with no bruising or swelling noted at site.  Good blood return noted before and after infusion.  Band aid applied.  Instructed the patient to take her blood pressure medications as directed with understanding verbalized.  VSS with discharge and left ambulatory with no s/s of distress noted.

## 2019-01-05 NOTE — Patient Instructions (Signed)

## 2019-01-07 ENCOUNTER — Encounter (HOSPITAL_COMMUNITY): Payer: Self-pay | Admitting: Emergency Medicine

## 2019-01-07 ENCOUNTER — Inpatient Hospital Stay (HOSPITAL_COMMUNITY)
Admission: EM | Admit: 2019-01-07 | Discharge: 2019-01-12 | DRG: 683 | Disposition: A | Discharge status: Home / Self Care | Payer: Medicare Other | Attending: Internal Medicine | Admitting: Internal Medicine

## 2019-01-07 ENCOUNTER — Other Ambulatory Visit: Payer: Self-pay

## 2019-01-07 ENCOUNTER — Emergency Department (HOSPITAL_COMMUNITY): Payer: Medicare Other

## 2019-01-07 DIAGNOSIS — F329 Major depressive disorder, single episode, unspecified: Secondary | ICD-10-CM | POA: Diagnosis present

## 2019-01-07 DIAGNOSIS — I129 Hypertensive chronic kidney disease with stage 1 through stage 4 chronic kidney disease, or unspecified chronic kidney disease: Secondary | ICD-10-CM | POA: Diagnosis present

## 2019-01-07 DIAGNOSIS — K219 Gastro-esophageal reflux disease without esophagitis: Secondary | ICD-10-CM | POA: Diagnosis present

## 2019-01-07 DIAGNOSIS — Z833 Family history of diabetes mellitus: Secondary | ICD-10-CM

## 2019-01-07 DIAGNOSIS — D509 Iron deficiency anemia, unspecified: Secondary | ICD-10-CM | POA: Diagnosis present

## 2019-01-07 DIAGNOSIS — Z8249 Family history of ischemic heart disease and other diseases of the circulatory system: Secondary | ICD-10-CM | POA: Diagnosis not present

## 2019-01-07 DIAGNOSIS — E1122 Type 2 diabetes mellitus with diabetic chronic kidney disease: Secondary | ICD-10-CM | POA: Diagnosis present

## 2019-01-07 DIAGNOSIS — L409 Psoriasis, unspecified: Secondary | ICD-10-CM | POA: Insufficient documentation

## 2019-01-07 DIAGNOSIS — E782 Mixed hyperlipidemia: Secondary | ICD-10-CM | POA: Diagnosis present

## 2019-01-07 DIAGNOSIS — Z79899 Other long term (current) drug therapy: Secondary | ICD-10-CM

## 2019-01-07 DIAGNOSIS — R197 Diarrhea, unspecified: Secondary | ICD-10-CM | POA: Diagnosis present

## 2019-01-07 DIAGNOSIS — D631 Anemia in chronic kidney disease: Secondary | ICD-10-CM | POA: Diagnosis present

## 2019-01-07 DIAGNOSIS — I10 Essential (primary) hypertension: Secondary | ICD-10-CM | POA: Diagnosis not present

## 2019-01-07 DIAGNOSIS — M109 Gout, unspecified: Secondary | ICD-10-CM | POA: Diagnosis present

## 2019-01-07 DIAGNOSIS — N184 Chronic kidney disease, stage 4 (severe): Secondary | ICD-10-CM | POA: Diagnosis present

## 2019-01-07 DIAGNOSIS — M810 Age-related osteoporosis without current pathological fracture: Secondary | ICD-10-CM | POA: Diagnosis present

## 2019-01-07 DIAGNOSIS — Z1159 Encounter for screening for other viral diseases: Secondary | ICD-10-CM | POA: Diagnosis not present

## 2019-01-07 DIAGNOSIS — R000 Tachycardia, unspecified: Secondary | ICD-10-CM

## 2019-01-07 DIAGNOSIS — N059 Unspecified nephritic syndrome with unspecified morphologic changes: Secondary | ICD-10-CM | POA: Diagnosis not present

## 2019-01-07 DIAGNOSIS — N179 Acute kidney failure, unspecified: Principal | ICD-10-CM | POA: Diagnosis present

## 2019-01-07 DIAGNOSIS — Z23 Encounter for immunization: Secondary | ICD-10-CM | POA: Diagnosis present

## 2019-01-07 DIAGNOSIS — Z882 Allergy status to sulfonamides status: Secondary | ICD-10-CM

## 2019-01-07 DIAGNOSIS — F32A Depression, unspecified: Secondary | ICD-10-CM | POA: Insufficient documentation

## 2019-01-07 DIAGNOSIS — R630 Anorexia: Secondary | ICD-10-CM | POA: Diagnosis present

## 2019-01-07 DIAGNOSIS — E872 Acidosis: Secondary | ICD-10-CM | POA: Diagnosis present

## 2019-01-07 DIAGNOSIS — F419 Anxiety disorder, unspecified: Secondary | ICD-10-CM | POA: Insufficient documentation

## 2019-01-07 DIAGNOSIS — M069 Rheumatoid arthritis, unspecified: Secondary | ICD-10-CM | POA: Diagnosis present

## 2019-01-07 DIAGNOSIS — Z7984 Long term (current) use of oral hypoglycemic drugs: Secondary | ICD-10-CM

## 2019-01-07 LAB — URINALYSIS, ROUTINE W REFLEX MICROSCOPIC
Bilirubin Urine: NEGATIVE
Glucose, UA: NEGATIVE mg/dL
Ketones, ur: NEGATIVE mg/dL
Leukocytes,Ua: NEGATIVE
Nitrite: NEGATIVE
Protein, ur: >=300 mg/dL — AB
RBC / HPF: >50 RBC/hpf — ABNORMAL HIGH (ref 0–5)
Specific Gravity, Urine: 1.014 (ref 1.005–1.030)
pH: 7 (ref 5.0–8.0)

## 2019-01-07 LAB — CBC WITH DIFFERENTIAL/PLATELET
Abs Immature Granulocytes: 0.02 10*3/uL (ref 0.00–0.07)
Basophils Absolute: 0 10*3/uL (ref 0.0–0.1)
Basophils Relative: 1 %
Eosinophils Absolute: 0.2 10*3/uL (ref 0.0–0.5)
Eosinophils Relative: 4 %
HCT: 30.6 % — ABNORMAL LOW (ref 36.0–46.0)
Hemoglobin: 9.7 g/dL — ABNORMAL LOW (ref 12.0–15.0)
Immature Granulocytes: 0 %
Lymphocytes Relative: 12 %
Lymphs Abs: 0.6 10*3/uL — ABNORMAL LOW (ref 0.7–4.0)
MCH: 29.3 pg (ref 26.0–34.0)
MCHC: 31.7 g/dL (ref 30.0–36.0)
MCV: 92.4 fL (ref 80.0–100.0)
Monocytes Absolute: 0.6 10*3/uL (ref 0.1–1.0)
Monocytes Relative: 11 %
Neutro Abs: 3.9 10*3/uL (ref 1.7–7.7)
Neutrophils Relative %: 72 %
Platelets: 196 10*3/uL (ref 150–400)
RBC: 3.31 MIL/uL — ABNORMAL LOW (ref 3.87–5.11)
RDW: 13.7 % (ref 11.5–15.5)
WBC: 5.4 10*3/uL (ref 4.0–10.5)
nRBC: 0 % (ref 0.0–0.2)

## 2019-01-07 LAB — COMPREHENSIVE METABOLIC PANEL
ALT: 16 U/L (ref 0–44)
AST: 22 U/L (ref 15–41)
Albumin: 2.4 g/dL — ABNORMAL LOW (ref 3.5–5.0)
Alkaline Phosphatase: 56 U/L (ref 38–126)
Anion gap: 14 (ref 5–15)
BUN: 80 mg/dL — ABNORMAL HIGH (ref 8–23)
CO2: 16 mmol/L — ABNORMAL LOW (ref 22–32)
Calcium: 7.6 mg/dL — ABNORMAL LOW (ref 8.9–10.3)
Chloride: 107 mmol/L (ref 98–111)
Creatinine, Ser: 6.42 mg/dL — ABNORMAL HIGH (ref 0.44–1.00)
GFR calc Af Amer: 7 mL/min — ABNORMAL LOW (ref >60)
GFR calc non Af Amer: 6 mL/min — ABNORMAL LOW (ref >60)
Glucose, Bld: 83 mg/dL (ref 70–99)
Potassium: 4.7 mmol/L (ref 3.5–5.1)
Sodium: 137 mmol/L (ref 135–145)
Total Bilirubin: 0.6 mg/dL (ref 0.3–1.2)
Total Protein: 5.7 g/dL — ABNORMAL LOW (ref 6.5–8.1)

## 2019-01-07 LAB — CK: Total CK: 90 U/L (ref 38–234)

## 2019-01-07 MED ORDER — SODIUM CHLORIDE 0.9 % IV SOLN
INTRAVENOUS | Status: AC
  Administered 2019-01-07 – 2019-01-08 (×2): via INTRAVENOUS

## 2019-01-07 MED ORDER — PNEUMOCOCCAL VAC POLYVALENT 25 MCG/0.5ML IJ INJ: 0.5000 mL | INJECTION | INTRAMUSCULAR | Status: DC

## 2019-01-07 MED ORDER — SODIUM BICARBONATE 650 MG PO TABS
650.0000 mg | ORAL_TABLET | Freq: Two times a day (BID) | ORAL | Status: DC
  Administered 2019-01-07 – 2019-01-08 (×3): 650 mg via ORAL
  Filled 2019-01-07 (×4): qty 1

## 2019-01-07 MED ORDER — HYDRALAZINE HCL 20 MG/ML IJ SOLN
20.0000 mg | INTRAMUSCULAR | Status: DC | PRN
  Administered 2019-01-07: 20 mg via INTRAVENOUS
  Filled 2019-01-07: qty 1

## 2019-01-07 MED ORDER — HEPARIN SODIUM (PORCINE) 5000 UNIT/ML IJ SOLN
5000.0000 [IU] | Freq: Three times a day (TID) | INTRAMUSCULAR | Status: DC
  Administered 2019-01-07 – 2019-01-12 (×14): 5000 [IU] via SUBCUTANEOUS
  Filled 2019-01-07 (×14): qty 1

## 2019-01-07 MED ORDER — ACETAMINOPHEN 650 MG RE SUPP: 650.0000 mg | Freq: Four times a day (QID) | RECTAL | Status: DC | PRN

## 2019-01-07 MED ORDER — HYDRALAZINE HCL 25 MG PO TABS
50.0000 mg | ORAL_TABLET | Freq: Two times a day (BID) | ORAL | Status: DC
  Administered 2019-01-07 – 2019-01-12 (×10): 50 mg via ORAL
  Filled 2019-01-07 (×10): qty 2

## 2019-01-07 MED ORDER — ONDANSETRON HCL 4 MG/2ML IJ SOLN
4.0000 mg | Freq: Four times a day (QID) | INTRAMUSCULAR | Status: DC | PRN
  Administered 2019-01-07 – 2019-01-09 (×2): 4 mg via INTRAVENOUS
  Filled 2019-01-07 (×3): qty 2

## 2019-01-07 MED ORDER — ACETAMINOPHEN 325 MG PO TABS
650.0000 mg | ORAL_TABLET | Freq: Four times a day (QID) | ORAL | Status: DC | PRN
  Administered 2019-01-07: 650 mg via ORAL
  Filled 2019-01-07: qty 2

## 2019-01-07 MED ORDER — NIFEDIPINE ER OSMOTIC RELEASE 30 MG PO TB24
30.0000 mg | ORAL_TABLET | Freq: Every day | ORAL | Status: DC
  Administered 2019-01-07 – 2019-01-08 (×2): 30 mg via ORAL
  Filled 2019-01-07 (×3): qty 1

## 2019-01-07 MED ORDER — CARVEDILOL 12.5 MG PO TABS
12.5000 mg | ORAL_TABLET | Freq: Two times a day (BID) | ORAL | Status: DC
  Administered 2019-01-07 – 2019-01-12 (×10): 12.5 mg via ORAL
  Filled 2019-01-07 (×9): qty 1

## 2019-01-07 MED ORDER — HYDRALAZINE HCL 20 MG/ML IJ SOLN
10.0000 mg | Freq: Once | INTRAMUSCULAR | Status: AC
  Administered 2019-01-07: 10 mg via INTRAVENOUS
  Filled 2019-01-07: qty 1

## 2019-01-07 MED ORDER — PANTOPRAZOLE SODIUM 40 MG PO TBEC
40.0000 mg | DELAYED_RELEASE_TABLET | Freq: Every day | ORAL | Status: DC
  Administered 2019-01-07 – 2019-01-12 (×6): 40 mg via ORAL
  Filled 2019-01-07 (×7): qty 1

## 2019-01-07 NOTE — ED Notes (Signed)
Pt reports that she has been in a drug study using jardiance. Pt reports nausea since last Thursday and has noted swelling in ankles. Notified that labs are elevated

## 2019-01-07 NOTE — ED Triage Notes (Signed)
Patient reports she is in a trial at [facility], in Stage 4 renal failure. Patient states she has been nauseated and had extremity swelling that has been worse since Friday. Physician from [facility] called and told patient to come ton the ED.

## 2019-01-07 NOTE — ED Provider Notes (Signed)
[facility] EMERGENCY DEPARTMENT Provider Note   CSN: [id number] Arrival date & time: 01/07/19  1250    History   Chief Complaint Chief Complaint  Patient presents with  . Acute Renal Failure    HPI Felicia Williams is a 78 y.o. female past medical history of stage IV CKD, anemia of chronic disease, hypertension, hyperlipidemia, presenting to the emergency department with complaint of acute renal failure.  Patient states she is in a medical trial with [facility], where she is taking Jardiance for her renal insufficiency.  She states she has been doing this for about 6 months.  She reports over the last week or so she is been feeling nauseated with some pedal edema.  She states she has has generalized fatigue and weakness.  She had her creatinine checked on Friday reportedly doubled, therefore was recommended to come to the ED for acute renal failure.  Her creatinine was 2.9 in May of this year, and on Friday was 4.89.,  Potassium 5.2.  She denies chest pain, shortness of breath, fever, abdominal pain.  She states she has not taken her blood pressure medicine since the day before yesterday because of her nausea.  She is not currently nauseous on evaluation.  She is not on dialysis. Pt's nephrologist is Dr. Joseph Berkshire with [facility].     The history is provided by the patient and medical records.    Past Medical History:  Diagnosis Date  . Anemia in stage 4 chronic kidney disease ([place]) 08/18/2018  . Depression   . Essential hypertension, benign   . GERD (gastroesophageal reflux disease)   . Glomerulonephritis   . Gout   . Mixed hyperlipidemia   . Renal insufficiency     Patient Active Problem List   Diagnosis Date Noted  . Anemia in stage 4 chronic kidney disease ([place]) 08/18/2018  . Essential hypertension, benign 01/26/2014  . Palpitations 01/26/2014  . Fatigue due to depression 01/26/2014    Past Surgical History:  Procedure Laterality Date  . APPENDECTOMY    . CATARACT  EXTRACTION    . COLONOSCOPY N/A 12/28/2015   Procedure: COLONOSCOPY;  Surgeon: Rogene Houston, MD;  Location: [facility];  Service: Endoscopy;  Laterality: N/A;  815  . TUBAL LIGATION       OB History    Gravida  2   Para  2   Term  2   Preterm      AB      Living        SAB      TAB      Ectopic      Multiple      Live Births               Home Medications    Prior to Admission medications   Medication Sig Start Date End Date Taking? Authorizing Provider  acetaminophen (TYLENOL) 325 MG tablet Take by mouth.    [provider]  Calcium Carbonate (CALTRATE 600 PO) Take 1 tablet by mouth.    [provider]  carvedilol (COREG) 12.5 MG tablet Take by mouth. 03/01/15   [provider]  denosumab (PROLIA) 60 MG/ML SOSY injection Inject 60 mg into the skin every 6 (six) months. 08/26/18   Higgs, Mathis Dad, MD  ezetimibe (ZETIA) 10 MG tablet Take by mouth. 04/04/17   [provider]  furosemide (LASIX) 40 MG tablet Take by mouth. 09/09/18   [provider]  hydrALAZINE (APRESOLINE) 50 MG tablet  Take by mouth. 09/15/18   [provider]  HYDROcodone-acetaminophen (NORCO/VICODIN) 5-325 MG tablet Take 1 tablet by mouth 3 (three) times daily.    [provider]  lisinopril (PRINIVIL,ZESTRIL) 20 MG tablet Take by mouth. 04/07/18   [provider]  Multiple Vitamin (MULTIVITAMIN) tablet Take 1 tablet by mouth daily.    [provider]  NIFEdipine (ADALAT CC) 30 MG 24 hr tablet  11/17/18   [provider]  pantoprazole (PROTONIX) 40 MG tablet TAKE 1 TABLET BY MOUTH  DAILY 12/09/17   [provider]  simvastatin (ZOCOR) 40 MG tablet Take 40 mg by mouth daily.    [provider]  sodium bicarbonate 650 MG tablet Take 1,300 mg by mouth 2 (two) times daily. 04/16/18   [provider]  VITAMIN D, CHOLECALCIFEROL, PO Take 1 tablet by mouth.    [provider]    Family  History Family History  Problem Relation Age of Onset  . CAD Father   . Heart attack Father   . Diabetes Mellitus II Father   . Hypertension Father   . Lupus Brother     Social History Social History   Tobacco Use  . Smoking status: Never Smoker  . Smokeless tobacco: Never Used  Substance Use Topics  . Alcohol use: No  . Drug use: No     Allergies   Sulfa antibiotics   Review of Systems Review of Systems  All other systems reviewed and are negative.    Physical Exam Updated Vital Signs BP (!) 176/72   Pulse 74   Temp 98 F (36.7 C) (Oral)   Resp 13   SpO2 96%   Physical Exam Vitals signs and nursing note reviewed.  Constitutional:      General: She is not in acute distress.    Appearance: She is well-developed. She is not ill-appearing.  HENT:     Head: Normocephalic and atraumatic.  Eyes:     Conjunctiva/sclera: Conjunctivae normal.  Cardiovascular:     Rate and Rhythm: Normal rate and regular rhythm.     Pulses: Normal pulses.  Pulmonary:     Effort: Pulmonary effort is normal. No respiratory distress.     Breath sounds: Normal breath sounds.  Abdominal:     General: Bowel sounds are normal.     Palpations: Abdomen is soft.     Tenderness: There is no abdominal tenderness. There is no guarding or rebound.  Musculoskeletal:     Comments: Trace pedal edema bilaterally  Skin:    General: Skin is warm.  Neurological:     Mental Status: She is alert.  Psychiatric:        Mood and Affect: Mood normal.        Behavior: Behavior normal.      ED Treatments / Results  Labs (all labs ordered are listed, but only abnormal results are displayed) Labs Reviewed  COMPREHENSIVE METABOLIC PANEL - Abnormal; Notable for the following components:      Result Value   CO2 16 (*)    BUN 80 (*)    Creatinine, Ser 6.42 (*)    Calcium 7.6 (*)    Total Protein 5.7 (*)    Albumin 2.4 (*)    GFR calc non Af Amer 6 (*)    GFR calc Af Amer 7 (*)    All other  components within normal limits  CBC WITH DIFFERENTIAL/PLATELET - Abnormal; Notable for the following components:   RBC 3.31 (*)  Hemoglobin 9.7 (*)    HCT 30.6 (*)    Lymphs Abs 0.6 (*)    All other components within normal limits  URINALYSIS, ROUTINE W REFLEX MICROSCOPIC - Abnormal; Notable for the following components:   Hgb urine dipstick MODERATE (*)    Protein, ur >=300 (*)    RBC / HPF >50 (*)    Bacteria, UA RARE (*)    All other components within normal limits  CK    EKG None  Radiology US Renal  Result Date: 01/07/2019 CLINICAL DATA:  Acute renal failure EXAM: RENAL / URINARY TRACT ULTRASOUND COMPLETE COMPARISON:  None. FINDINGS: Right Kidney: Renal measurements: 9.9 x 4.6 x 4.9 cm = volume: 115.3 mL. Increased cortical echogenicity Left Kidney: Renal measurements: 9.9 x 4.9 x 5.0 cm = volume: 127.5 mL. Increased cortical echogenicity Bladder: Appears normal for degree of bladder distention. IMPRESSION: Increased cortical echogenicity bilaterally consistent with medical renal disease. No hydronephrosis or other abnormality. Electronically Signed   By: Dorise Bullion III M.D   On: 01/07/2019 16:27    Procedures Procedures (including critical care time)  Medications Ordered in ED Medications  hydrALAZINE (APRESOLINE) injection 10 mg (10 mg Intravenous Given 01/07/19 1556)     Initial Impression / Assessment and Plan / ED Course  I have reviewed the triage vital signs and the nursing notes.  Pertinent labs & imaging results that were available during my care of the patient were reviewed by me and considered in my medical decision making (see chart for details).  Clinical Course as of Jan 06 1709  Wed Jan 07, 2019  1531 [facility] nephrology, spoke with Dr. Justin Mend.  Recommends hold ACE inhibitor, and  simvistatin. Get ck, renal u./s and U/A. Admit to medicine, nephrology will consult in the morning.   [JR]    Clinical Course User Index [JR] Gamaliel Charney, [place] N, PA-C        Patient with history of CKD, currently in a drug trial with [facility], taking Jardiance for 6 months, presenting with acute renal failure.  Creatinine was 2.9 in May, 4.89 on Friday, and now today 6.42.  Patient has generalized fatigue with intermittent nausea and some pedal edema.  She is hypertensive as she has been unable to take her blood pressure medicines for 2 days due to the nausea.  Treated in the ED with IV hydralazine.  Consult placed to Dr. Justin Mend with nephrology, who recommends patient can be admitted to this hospital and nephrology will consult.  He recommend CK level, renal ultrasound and urinalysis.  Recommends hold ACE inhibitors and simvastatin. Appreciate consult.  Will admit to medicine.  Patient discussed with Dr. Roderic Palau.  The patient appears reasonably stabilized for admission considering the current resources, flow, and capabilities available in the ED at this time, and I doubt any other [facility] requiring further screening and/or treatment in the ED prior to admission.   Final Clinical Impressions(s) / ED Diagnoses   Final diagnoses:  Acute renal failure, unspecified acute renal failure type ([place])  Hypertension, unspecified type    ED Discharge Orders    None       Irvin Lizama, [place] N, PA-C 01/07/19 1711    Milton Ferguson, MD 01/08/19 [phone]

## 2019-01-07 NOTE — ED Notes (Signed)
[place] on kidneys completed

## 2019-01-07 NOTE — H&P (Signed)
History and Physical:    Felicia Williams   TKW:[id number] DOB: 12/16/1940 DOA: 01/07/2019  Referring MD/provider: PA [place] PCP: Octavio Graves, DO   Patient coming from: Home  Chief Complaint: "My kidney function is much worse"  History of Present Illness:   Felicia Williams is an 78 y.o. 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 Gomez Cleverly at [facility] who is also on a clinical trial with Jardiance to see if it will improve her renal function even though she does not have diabetes.  The clinical trial is administered by Dr. Joseph Berkshire also at [facility] and study nurse is [place] phone #[phone] space [phone].  Patient was in her usual state of health until about a week ago when she started noticing worse than usual fatigue associated with some anorexia and some nausea.  She has had no vomiting.  She called the study nurse who asked her to get her kidney function checked.  She was called today and was told that her creatinine had increased from 2.9-5.  She was asked to come to the emergency room for evaluation.  At present patient admits to ongoing anorexia and nausea.  No vomiting.  She also has fatigue if she has had for several months although worse over the past week.  Patient has a mild headache.  No fevers or chills.  No abdominal pain.  No hematuria.  Patient is continuing to urinate without change.   ED Course:  The patient was noted to be very hypertensive which was treated with IV hydralazine.  She was also noted to have a markedly elevated creatinine at 6.4.  This was discussed with Dr. Justin Mend who noted patient was okay to be admitted here and he would consult in the morning.  ROS:   ROS   Review of Systems: General: No fever, chills, Skin: No rashes, lesions, wounds Endocrine: no heat/cold intolerance, no polyuria Respiratory: No cough,, shortness of breath, hemoptysis Cardiovascular: No  palpitations, chest pain GU: No dysuria, increased frequency CNS: No numbness, dizziness, Blood/lymphatics: No easy bruising, bleeding   Past Medical History:   Past Medical History:  Diagnosis Date  . Anemia in stage 4 chronic kidney disease ([place]) 08/18/2018  . Depression   . Essential hypertension, benign   . GERD (gastroesophageal reflux disease)   . Glomerulonephritis   . Gout   . Mixed hyperlipidemia   . Renal insufficiency     Past Surgical History:   Past Surgical History:  Procedure Laterality Date  . APPENDECTOMY    . CATARACT EXTRACTION    . COLONOSCOPY N/A 12/28/2015   Procedure: COLONOSCOPY;  Surgeon: Rogene Houston, MD;  [facility];  Service: Endoscopy;  Laterality: N/A;  815  . TUBAL LIGATION      Social History:   Social History   Socioeconomic History  . Marital status: Single    Spouse name: Not on file  . Number of children: Not on file  . Years of education: Not on file  . Highest education level: Not on file  Occupational History  . Not on file  Social Needs  . Financial resource strain: Not on file  . Food insecurity    Worry: Not on file    Inability: Not on file  . Transportation needs    Medical: Not on file    Non-medical: Not on file  Tobacco Use  . Smoking status: Never Smoker  . Smokeless tobacco:  Never Used  Substance and Sexual Activity  . Alcohol use: No  . Drug use: No  . Sexual activity: Not Currently  Lifestyle  . Physical activity    Days per week: Not on file    Minutes per session: Not on file  . Stress: Not on file  Relationships  . Social Herbalist on phone: Not on file    Gets together: Not on file    Attends religious service: Not on file    Active member of club or organization: Not on file    Attends meetings of clubs or organizations: Not on file    Relationship status: Not on file  . Intimate partner violence    Fear of current or ex partner: Not on file    Emotionally abused:  Not on file    Physically abused: Not on file    Forced sexual activity: Not on file  Other Topics Concern  . Not on file  Social History Narrative  . Not on file    Allergies   Sulfa antibiotics  Family history:   Family History  Problem Relation Age of Onset  . CAD Father   . Heart attack Father   . Diabetes Mellitus II Father   . Hypertension Father   . Lupus Brother     Current Medications:   Prior to Admission medications   Medication Sig Start Date End Date Taking? Authorizing Provider  denosumab (PROLIA) 60 MG/ML SOSY injection Inject 60 mg into the skin every 6 (six) months. 08/26/18  Yes Higgs, Mathis Dad, MD  empagliflozin (JARDIANCE) 10 MG TABS tablet Take 10 mg by mouth daily.   Yes [provider]  epoetin alfa (EPOGEN) 20000 UNIT/ML injection Inject 20,000 Units into the skin every 14 (fourteen) days.   Yes [provider]  furosemide (LASIX) 40 MG tablet Take 40 mg by mouth daily.  09/09/18  Yes [provider]  hydrALAZINE (APRESOLINE) 50 MG tablet Take 50 mg by mouth 2 (two) times a day.  09/15/18  Yes [provider]  lisinopril (PRINIVIL,ZESTRIL) 20 MG tablet Take 20 mg by mouth daily.  04/07/18  Yes [provider]  NIFEdipine (ADALAT CC) 30 MG 24 hr tablet Take 30 mg by mouth daily.  11/17/18  Yes [provider]  pantoprazole (PROTONIX) 40 MG tablet Take 40 mg by mouth daily.  12/09/17  Yes [provider]  sodium bicarbonate 650 MG tablet Take 650 mg by mouth 2 (two) times daily.  04/16/18  Yes [provider]  Vitamin D, Cholecalciferol, 50 MCG (2000 UT) CAPS Take 2,000 Units by mouth daily.    Yes [provider]  carvedilol (COREG) 12.5 MG tablet Take 12.5 mg by mouth 2 (two) times daily with a meal.  03/01/15   [provider]    Physical Exam:   Vitals:   01/07/19 1600 01/07/19 1630 01/07/19 1700 01/07/19 1730  BP: (!) 202/74 (!) 176/72 (!) 186/84 (!) 209/98  Pulse: 72  74 84 100  Resp:   17 17  Temp:      TempSrc:      SpO2: 96% 96% 98% 96%     Physical Exam: Blood pressure (!) 209/98, pulse 100, temperature 98 F (36.7 C), temperature source Oral, resp. rate 17, SpO2 96 %. Gen: Well-appearing female sitting up in stretcher in no acute distress.  Patient is friendly and appears to be in reasonable spirits. Eyes: Sclerae anicteric. Conjunctiva mildly injected. Chest: Moderately  good air entry bilaterally with no adventitious sounds.  CV: Distant, regular, no audible murmurs. Abdomen: NABS, soft, nondistended, nontender.No rebound, no guarding. Extremities: 2+ edema in dependent areas lower extremities bilaterally. Skin: Warm and dry. No rashes, lesions or wounds. Neuro: Alert and oriented times 3; grossly nonfocal. Psych: Patient is cooperative, logical and coherent with appropriate mood and affect.  Data Review:    Labs: Basic Metabolic Panel: Recent Labs  Lab 01/07/19 1342  NA 137  K 4.7  CL 107  CO2 16*  GLUCOSE 83  BUN 80*  CREATININE 6.42*  CALCIUM 7.6*   Liver Function Tests: Recent Labs  Lab 01/07/19 1342  AST 22  ALT 16  ALKPHOS 56  BILITOT 0.6  PROT 5.7*  ALBUMIN 2.4*   No results for input(s): LIPASE, AMYLASE in the last 168 hours. No results for input(s): AMMONIA in the last 168 hours. CBC: Recent Labs  Lab 01/07/19 1342  WBC 5.4  NEUTROABS 3.9  HGB 9.7*  HCT 30.6*  MCV 92.4  PLT 196   Cardiac Enzymes: Recent Labs  Lab 01/07/19 1546  CKTOTAL 90    BNP (last 3 results) No results for input(s): PROBNP in the last 8760 hours. CBG: No results for input(s): GLUCAP in the last 168 hours.  Urinalysis    Component Value Date/Time   COLORURINE YELLOW 01/07/2019 1533   APPEARANCEUR CLEAR 01/07/2019 1533   LABSPEC 1.014 01/07/2019 1533   PHURINE 7.0 01/07/2019 1533   GLUCOSEU NEGATIVE 01/07/2019 1533   HGBUR MODERATE (A) 01/07/2019 1533   BILIRUBINUR NEGATIVE 01/07/2019 1533   KETONESUR NEGATIVE  01/07/2019 1533   PROTEINUR >=300 (A) 01/07/2019 1533   UROBILINOGEN 0.2 01/11/2014 1635   NITRITE NEGATIVE 01/07/2019 1533   LEUKOCYTESUR NEGATIVE 01/07/2019 1533      Radiographic Studies: US Renal  Result Date: 01/07/2019 CLINICAL DATA:  Acute renal failure EXAM: RENAL / URINARY TRACT ULTRASOUND COMPLETE COMPARISON:  None. FINDINGS: Right Kidney: Renal measurements: 9.9 x 4.6 x 4.9 cm = volume: 115.3 mL. Increased cortical echogenicity Left Kidney: Renal measurements: 9.9 x 4.9 x 5.0 cm = volume: 127.5 mL. Increased cortical echogenicity Bladder: Appears normal for degree of bladder distention. IMPRESSION: Increased cortical echogenicity bilaterally consistent with medical renal disease. No hydronephrosis or other abnormality. Electronically Signed   By: Dorise Bullion III M.D   On: 01/07/2019 16:27    EKG: Independently reviewed.  Sinus rhythm at 70.  Normal intervals.  Axis -20.  No acute ST-T wave changes.   Assessment/Plan:   Principal Problem:   ARF (acute renal failure) (HCC) Active Problems:   Essential hypertension, benign   Glomerulonephritis   Tachycardia, unspecified  78 year old female with known glomerulonephritis with baseline creatinine of 3 now presents with creatinine of 6.4 while being on a study for Jardiance.  No clear cause of market worsening in renal function is known at this time.  Number for study nurse Ander Purpura is in HPI.   ARF Present because of worsened renal failure is not entirely clear, possibly secondary to Jardiance. Hold Jardiance Also hold patient's Lasix and lisinopril Very gentle hydration with normal saline to 50 cc an hour, need to follow BP closely Patient does not appear to be on any other renal toxic medications Request renal consult with Dr. Justin Mend placed  HTN Continue carvedilol, Procardia XL and oral hydralazine We will also continue PRN IV hydralazine as blood pressure will likely rise as we are holding her Lasix and lisinopril  Further management per renal tomorrow  TACHYCARDIA Patient with unspecified tachycardia, will continue carvedilol, telemetry monitoring for the first 12 to 24 hours    Other information:   DVT prophylaxis: Heparin ordered. Code Status: Full code. Family Communication: Patient's family knows she is in house Disposition Plan: Home Consults called: Renal consult for the morning Admission status: Inpatient   The medical decision making is of moderate complexity, therefore this is a level 2 visit.  Dewaine Oats Tublu Felicia Williams [facility]  If 7PM-7AM, please contact night-coverage [email or web address] Password [facility] 01/07/2019, 6:08 PM

## 2019-01-07 NOTE — ED Notes (Signed)
ED TO INPATIENT HANDOFF REPORT  ED Nurse Name and Phone #: Osie Cheeks Name/Age/Gender Felicia Williams 78 y.o. female Room/Bed: APA14/APA14  Code Status   Code Status: Not on file  Home/SNF/Other Home Patient oriented to: self, place, time and situation Is this baseline? yes  Triage Complete: Triage complete  Chief Complaint feet swelling  Triage Note Patient reports she is in a trial at [facility], in Stage 4 renal failure. Patient states she has been nauseated and had extremity swelling that has been worse since Friday. Physician from [facility] called and told patient to come ton the ED.   Allergies Allergies  Allergen Reactions  . Sulfa Antibiotics Other (See Comments)    Shut pt's kidneys down    Level of Care/Admitting Diagnosis ED Disposition    ED Disposition Condition [place]: [facility] [[id number]]  Level of Care: Telemetry [5]  Covid Evaluation: Screening Protocol (No Symptoms)  Diagnosis: ARF (acute renal failure) [facility]) [[id number]]  Admitting Physician: Vashti Hey [[id number]]  Attending Physician: Vashti Hey [[id number]]  Estimated length of stay: past midnight tomorrow  Certification:: I certify this patient will need inpatient services for at least 2 midnights  PT Class (Do Not Modify): Inpatient [101]  PT Acc Code (Do Not Modify): Private [1]       B Medical/Surgery History Past Medical History:  Diagnosis Date  . Anemia in stage 4 chronic kidney disease ([place]) 08/18/2018  . Depression   . Essential hypertension, benign   . GERD (gastroesophageal reflux disease)   . Glomerulonephritis   . Gout   . Mixed hyperlipidemia   . Renal insufficiency    Past Surgical History:  Procedure Laterality Date  . APPENDECTOMY    . CATARACT EXTRACTION    . COLONOSCOPY N/A 12/28/2015   Procedure: COLONOSCOPY;  Surgeon: Rogene Houston, MD;  Location[facility];  Service: Endoscopy;  Laterality: N/A;  815  .  TUBAL LIGATION       A IV Location/Drains/Wounds Patient Lines/Drains/Airways Status   Active Line/Drains/Airways    Name:   Placement date:   Placement time:   Site:   Days:   Peripheral IV 01/07/19 Right Hand   01/07/19    1446    Hand   less than 1          Intake/Output Last 24 hours No intake or output data in the 24 hours ending 01/07/19 1803  Labs/Imaging Results for orders placed or performed during the hospital encounter of 01/07/19 (from the past 48 hour(s))  Comprehensive metabolic panel     Status: Abnormal   Collection Time: 01/07/19  1:42 PM  Result Value Ref Range   Sodium 137 135 - 145 mmol/L   Potassium 4.7 3.5 - 5.1 mmol/L   Chloride 107 98 - 111 mmol/L   CO2 16 (L) 22 - 32 mmol/L   Glucose, Bld 83 70 - 99 mg/dL   BUN 80 (H) 8 - 23 mg/dL   Creatinine, Ser 6.42 (H) 0.44 - 1.00 mg/dL   Calcium 7.6 (L) 8.9 - 10.3 mg/dL   Total Protein 5.7 (L) 6.5 - 8.1 g/dL   Albumin 2.4 (L) 3.5 - 5.0 g/dL   AST 22 15 - 41 U/L   ALT 16 0 - 44 U/L   Alkaline Phosphatase 56 38 - 126 U/L   Total Bilirubin 0.6 0.3 - 1.2 mg/dL   GFR calc non Af Amer 6 (L) >60 mL/min   GFR  calc Af Amer 7 (L) >60 mL/min   Anion gap 14 5 - 15    Comment: Performed at [facility], [address], [place], [place] [address]  CBC with Differential     Status: Abnormal   Collection Time: 01/07/19  1:42 PM  Result Value Ref Range   WBC 5.4 4.0 - 10.5 K/uL   RBC 3.31 (L) 3.87 - 5.11 MIL/uL   Hemoglobin 9.7 (L) 12.0 - 15.0 g/dL   HCT 30.6 (L) 36.0 - 46.0 %   MCV 92.4 80.0 - 100.0 fL   MCH 29.3 26.0 - 34.0 pg   MCHC 31.7 30.0 - 36.0 g/dL   RDW 13.7 11.5 - 15.5 %   Platelets 196 150 - 400 K/uL   nRBC 0.0 0.0 - 0.2 %   Neutrophils Relative % 72 %   Neutro Abs 3.9 1.7 - 7.7 K/uL   Lymphocytes Relative 12 %   Lymphs Abs 0.6 (L) 0.7 - 4.0 K/uL   Monocytes Relative 11 %   Monocytes Absolute 0.6 0.1 - 1.0 K/uL   Eosinophils Relative 4 %   Eosinophils Absolute 0.2 0.0 - 0.5 K/uL   Basophils  Relative 1 %   Basophils Absolute 0.0 0.0 - 0.1 K/uL   Immature Granulocytes 0 %   Abs Immature Granulocytes 0.02 0.00 - 0.07 K/uL    Comment: Performed at [facility], [address] [place], [place] [address]  Urinalysis, Routine w reflex microscopic     Status: Abnormal   Collection Time: 01/07/19  3:33 PM  Result Value Ref Range   Color, Urine YELLOW YELLOW   APPearance CLEAR CLEAR   Specific Gravity, Urine 1.014 1.005 - 1.030   pH 7.0 5.0 - 8.0   Glucose, UA NEGATIVE NEGATIVE mg/dL   Hgb urine dipstick MODERATE (A) NEGATIVE   Bilirubin Urine NEGATIVE NEGATIVE   Ketones, ur NEGATIVE NEGATIVE mg/dL   Protein, ur >=300 (A) NEGATIVE mg/dL   Nitrite NEGATIVE NEGATIVE   Leukocytes,Ua NEGATIVE NEGATIVE   RBC / HPF >50 (H) 0 - 5 RBC/hpf   WBC, UA 6-10 0 - 5 WBC/hpf   Bacteria, UA RARE (A) NONE SEEN   Squamous Epithelial / LPF 0-5 0 - 5   Hyaline Casts, UA PRESENT     Comment: Performed at [facility], [address], [place], [place] [address]  CK     Status: None   Collection Time: 01/07/19  3:46 PM  Result Value Ref Range   Total CK 90 38 - 234 U/L    Comment: Performed at [facility], [address] [place], [place] [address]   US Renal  Result Date: 01/07/2019 CLINICAL DATA:  Acute renal failure EXAM: RENAL / URINARY TRACT ULTRASOUND COMPLETE COMPARISON:  None. FINDINGS: Right Kidney: Renal measurements: 9.9 x 4.6 x 4.9 cm = volume: 115.3 mL. Increased cortical echogenicity Left Kidney: Renal measurements: 9.9 x 4.9 x 5.0 cm = volume: 127.5 mL. Increased cortical echogenicity Bladder: Appears normal for degree of bladder distention. IMPRESSION: Increased cortical echogenicity bilaterally consistent with medical renal disease. No hydronephrosis or other abnormality. Electronically Signed   By: Dorise Bullion III M.D   On: 01/07/2019 16:27    Pending Labs Unresulted Labs (From admission, onward)    Start     Ordered   01/07/19 1746  Novel Coronavirus,NAA,(SEND-OUT TO REF LAB  - TAT 24-48 hrs); Hosp Order  (Asymptomatic Patients Labs)  Once,   R    Question:  Rule Out  Answer:  Yes   01/07/19 1745          Vitals/Pain Today's Vitals   01/07/19 1600 01/07/19 1630 01/07/19 1700 01/07/19 1730  BP: (!) 202/74 (!) 176/72 (!) 186/84 (!) 209/98  Pulse: 72 74 84 100  Resp:   17 17  Temp:      TempSrc:      SpO2: 96% 96% 98% 96%  PainSc:        Isolation Precautions No active isolations  Medications Medications  hydrALAZINE (APRESOLINE) injection 10 mg (10 mg Intravenous Given 01/07/19 1556)    Mobility walks Low fall risk   Focused Assessments    R Recommendations: See Admitting Provider Note  Report given to:   Additional Notes:

## 2019-01-08 ENCOUNTER — Other Ambulatory Visit (HOSPITAL_COMMUNITY): Payer: Medicare Other

## 2019-01-08 ENCOUNTER — Ambulatory Visit (HOSPITAL_COMMUNITY): Payer: Medicare Other

## 2019-01-08 DIAGNOSIS — N179 Acute kidney failure, unspecified: Principal | ICD-10-CM

## 2019-01-08 LAB — COMPREHENSIVE METABOLIC PANEL
ALT: 14 U/L (ref 0–44)
AST: 19 U/L (ref 15–41)
Albumin: 2.1 g/dL — ABNORMAL LOW (ref 3.5–5.0)
Alkaline Phosphatase: 47 U/L (ref 38–126)
Anion gap: 8 (ref 5–15)
BUN: 79 mg/dL — ABNORMAL HIGH (ref 8–23)
CO2: 19 mmol/L — ABNORMAL LOW (ref 22–32)
Calcium: 7.3 mg/dL — ABNORMAL LOW (ref 8.9–10.3)
Chloride: 114 mmol/L — ABNORMAL HIGH (ref 98–111)
Creatinine, Ser: 6.34 mg/dL — ABNORMAL HIGH (ref 0.44–1.00)
GFR calc Af Amer: 7 mL/min — ABNORMAL LOW (ref >60)
GFR calc non Af Amer: 6 mL/min — ABNORMAL LOW (ref >60)
Glucose, Bld: 81 mg/dL (ref 70–99)
Potassium: 4.5 mmol/L (ref 3.5–5.1)
Sodium: 141 mmol/L (ref 135–145)
Total Bilirubin: 0.7 mg/dL (ref 0.3–1.2)
Total Protein: 4.8 g/dL — ABNORMAL LOW (ref 6.5–8.1)

## 2019-01-08 LAB — URINALYSIS, ROUTINE W REFLEX MICROSCOPIC
Bilirubin Urine: NEGATIVE
Glucose, UA: 50 mg/dL — AB
Ketones, ur: NEGATIVE mg/dL
Nitrite: NEGATIVE
Protein, ur: >=300 mg/dL — AB
RBC / HPF: >50 RBC/hpf — ABNORMAL HIGH (ref 0–5)
Specific Gravity, Urine: 1.013 (ref 1.005–1.030)
WBC, UA: >50 WBC/hpf — ABNORMAL HIGH (ref 0–5)
pH: 6 (ref 5.0–8.0)

## 2019-01-08 LAB — CBC
HCT: 28.3 % — ABNORMAL LOW (ref 36.0–46.0)
Hemoglobin: 8.7 g/dL — ABNORMAL LOW (ref 12.0–15.0)
MCH: 28.8 pg (ref 26.0–34.0)
MCHC: 30.7 g/dL (ref 30.0–36.0)
MCV: 93.7 fL (ref 80.0–100.0)
Platelets: 186 10*3/uL (ref 150–400)
RBC: 3.02 MIL/uL — ABNORMAL LOW (ref 3.87–5.11)
RDW: 13.7 % (ref 11.5–15.5)
WBC: 5 10*3/uL (ref 4.0–10.5)
nRBC: 0 % (ref 0.0–0.2)

## 2019-01-08 LAB — NOVEL CORONAVIRUS, NAA (HOSP ORDER, SEND-OUT TO REF LAB; TAT 18-24 HRS): SARS-CoV-2, NAA: NOT DETECTED

## 2019-01-08 LAB — PROTEIN / CREATININE RATIO, URINE
Creatinine, Urine: 89.24 mg/dL
Protein Creatinine Ratio: 6.06 mg/mg{Cre} — ABNORMAL HIGH (ref 0.00–0.15)
Total Protein, Urine: 541 mg/dL

## 2019-01-08 MED ORDER — HYDRALAZINE HCL 20 MG/ML IJ SOLN
10.0000 mg | Freq: Four times a day (QID) | INTRAMUSCULAR | Status: DC | PRN
  Administered 2019-01-09 – 2019-01-10 (×2): 10 mg via INTRAVENOUS
  Filled 2019-01-08 (×3): qty 1

## 2019-01-08 MED ORDER — TRAMADOL HCL 50 MG PO TABS
50.0000 mg | ORAL_TABLET | Freq: Four times a day (QID) | ORAL | Status: DC | PRN
  Administered 2019-01-08 – 2019-01-11 (×5): 50 mg via ORAL
  Filled 2019-01-08 (×5): qty 1

## 2019-01-08 MED ORDER — LIDOCAINE 5 % EX PTCH
1.0000 | MEDICATED_PATCH | CUTANEOUS | Status: DC
  Administered 2019-01-08 – 2019-01-11 (×4): 1 via TRANSDERMAL
  Filled 2019-01-08 (×4): qty 1

## 2019-01-08 MED ORDER — DARBEPOETIN ALFA 60 MCG/0.3ML IJ SOSY
60.0000 ug | PREFILLED_SYRINGE | Freq: Once | INTRAMUSCULAR | Status: AC
  Administered 2019-01-08: 60 ug via SUBCUTANEOUS
  Filled 2019-01-08: qty 0.3

## 2019-01-08 MED ORDER — HYDRALAZINE HCL 20 MG/ML IJ SOLN: 10.0000 mg | Freq: Four times a day (QID) | INTRAMUSCULAR | Status: DC | PRN

## 2019-01-08 MED ORDER — LOPERAMIDE HCL 2 MG PO CAPS
2.0000 mg | ORAL_CAPSULE | Freq: Once | ORAL | Status: AC
  Administered 2019-01-08: 2 mg via ORAL
  Filled 2019-01-08: qty 1

## 2019-01-08 NOTE — Consult Note (Signed)
[facility] Renal Consultation Note  Requesting MD: Heath Lark DO  Indication for Consultation:  AKI   Chief complaint: nausea and weakness  HPI:  Felicia Williams is a 78 y.o. female with a history of CKD stage IV followed by Dr. Al Corpus at [facility] who presented to [facility] on 6/17 with nausea, diarrhea, and fatigue/weakness which has been worsening over the past week.  She had also had outpatient labs which indicated that her creatinine had increased to 4.89 per charting and they recommended she come to the ER where her Cr was found to be 6.42.  Her creatinine was 2.91 on 11/26/18 and Cr had been 2.0 - 2.3 with a GFR around 20 since 03/2018 before that time.  Nausea is better now and she was able to eat some breakfast this morning.  Diarrhea is still an issue and has been going on for "a long time" - over a year.  She has been on immodium daily PRN for the same.  She has received gentle hydration here with current fluids at 50.  Initially had swelling over the weekend and this is actually improved.  No urine output is charted but she states that she has been "urinating normally".  She states that a rheumatologist recently diagnosed her with RA and she was given a steroid pack a week and a half ago.  No other new meds that she recalls.  No fever or rash.  No hemoptysis.  She has been short of breath for a couple of years; she would characterize her breathing as at baseline currently.  No blood per rectum or dark stools.  Last dose of Jardiance was Sunday night and last dose of lisinopril was Sunday night as well.  She normally does take lasix at home and last dose was Sunday night.  She does not have dialysis access; her provider has broached the topic of dialysis before.  She would want dialysis if indicated but is encouraged to hear we might be able to hold off at least for the time being.  She has been in a trial for Jardiance through Dr. Joseph Berkshire at [place] [facility] for the past several months and  this has been held as well.  She is also followed by Dr. Delton Coombes in hem/onc and had recent IV iron infusion on 6/15 and states last retacrit was on 6/1.  Note that with regard to past history she reports that she has had two kidney biopsies for glomerulonephritis.  Her last biopsy was last year.  She denies ever taking any steroids or immunosuppression for her kidneys or any chemo agents.   Work-up here notable for renal US with 9.9 cm kidneys bilaterally and no hydro.  CK level 90.  Urinalysis with proteinuria and hematuria.    Coronavirus test was sent out and is pending.  Door with caution sign indicating additional PPE/precautions required.  Creatinine, Ser  Date/Time Value Ref Range Status  01/08/2019 05:46 AM 6.34 (H) 0.44 - 1.00 mg/dL Final  01/07/2019 01:42 PM 6.42 (H) 0.44 - 1.00 mg/dL Final  11/26/2018 02:42 PM 2.91 (H) 0.44 - 1.00 mg/dL Final  09/24/2018 02:22 PM 2.20 (H) 0.44 - 1.00 mg/dL Final  09/11/2018 01:03 PM 2.23 (H) 0.44 - 1.00 mg/dL Final  08/26/2018 01:16 PM 2.20 (H) 0.44 - 1.00 mg/dL Final  08/11/2018 01:01 PM 2.19 (H) 0.44 - 1.00 mg/dL Final  08/11/2018 01:01 PM 2.19 (H) 0.44 - 1.00 mg/dL Final  05/23/2018 12:59 PM 2.23 (H) 0.44 - 1.00 mg/dL  Final  05/23/2018 12:59 PM 2.28 (H) 0.44 - 1.00 mg/dL Final  04/29/2018 12:37 PM 2.26 (H) 0.44 - 1.00 mg/dL Final  04/21/2018 09:53 AM 1.99 (H) 0.44 - 1.00 mg/dL Final  09/06/2017 11:31 AM 1.58 (H) 0.44 - 1.00 mg/dL Final  07/18/2016 07:48 AM 1.33 (H) 0.44 - 1.00 mg/dL Final  01/11/2014 01:05 PM 1.86 (H) 0.50 - 1.10 mg/dL Final     PMHx:   Past Medical History:  Diagnosis Date  . Anemia in stage 4 chronic kidney disease ([place]) 08/18/2018  . Depression   . Essential hypertension, benign   . GERD (gastroesophageal reflux disease)   . Glomerulonephritis   . Gout   . Mixed hyperlipidemia   . Renal insufficiency     Past Surgical History:  Procedure Laterality Date  . APPENDECTOMY    . CATARACT EXTRACTION    .  COLONOSCOPY N/A 12/28/2015   Procedure: COLONOSCOPY;  Surgeon: Rogene Houston, MD;  Location: [facility];  Service: Endoscopy;  Laterality: N/A;  815  . TUBAL LIGATION      Family Hx:  Family History  Problem Relation Age of Onset  . CAD Father   . Heart attack Father   . Diabetes Mellitus II Father   . Hypertension Father   . Lupus Brother     Social History:  reports that she has never smoked. She has never used smokeless tobacco. She reports that she does not drink alcohol or use drugs.  Allergies:  Allergies  Allergen Reactions  . Sulfa Antibiotics Other (See Comments)    Shut pt's kidneys down    Medications: Prior to Admission medications   Medication Sig Start Date End Date Taking? Authorizing Provider  denosumab (PROLIA) 60 MG/ML SOSY injection Inject 60 mg into the skin every 6 (six) months. 08/26/18  Yes Higgs, Mathis Dad, MD  empagliflozin (JARDIANCE) 10 MG TABS tablet Take 10 mg by mouth daily.   Yes [provider]  epoetin alfa (EPOGEN) 20000 UNIT/ML injection Inject 20,000 Units into the skin every 14 (fourteen) days.   Yes [provider]  furosemide (LASIX) 40 MG tablet Take 40 mg by mouth daily.  09/09/18  Yes [provider]  hydrALAZINE (APRESOLINE) 50 MG tablet Take 50 mg by mouth 2 (two) times a day.  09/15/18  Yes [provider]  NIFEdipine (ADALAT CC) 30 MG 24 hr tablet Take 30 mg by mouth daily.  11/17/18  Yes [provider]  pantoprazole (PROTONIX) 40 MG tablet Take 40 mg by mouth daily.  12/09/17  Yes [provider]  sodium bicarbonate 650 MG tablet Take 650 mg by mouth 2 (two) times daily.  04/16/18  Yes [provider]  Vitamin D, Cholecalciferol, 50 MCG (2000 UT) CAPS Take 2,000 Units by mouth daily.    Yes [provider]  carvedilol (COREG) 12.5 MG tablet Take 12.5 mg by mouth 2 (two) times daily with a meal.  03/01/15   [provider]    I have reviewed the patient's current  medications.  Labs:  BMP Latest Ref Rng & Units 01/08/2019 01/07/2019 11/26/2018  Glucose 70 - 99 mg/dL 81 83 107(H)  BUN 8 - 23 mg/dL 79(H) 80(H) 67(H)  Creatinine 0.44 - 1.00 mg/dL 6.34(H) 6.42(H) 2.91(H)  Sodium 135 - 145 mmol/L 141 137 144  Potassium 3.5 - 5.1 mmol/L 4.5 4.7 4.9  Chloride 98 - 111 mmol/L 114(H) 107 116(H)  CO2 22 - 32 mmol/L 19(L) 16(L) 18(L)  Calcium 8.9 -  10.3 mg/dL 7.3(L) 7.6(L) 8.5(L)    Urinalysis    Component Value Date/Time   COLORURINE YELLOW 01/07/2019 [place] 01/07/2019 1533   LABSPEC 1.014 01/07/2019 1533   PHURINE 7.0 01/07/2019 1533   GLUCOSEU NEGATIVE 01/07/2019 1533   HGBUR MODERATE (A) 01/07/2019 1533   BILIRUBINUR NEGATIVE 01/07/2019 1533   KETONESUR NEGATIVE 01/07/2019 1533   PROTEINUR >=300 (A) 01/07/2019 1533   UROBILINOGEN 0.2 01/11/2014 1635   NITRITE NEGATIVE 01/07/2019 1533   LEUKOCYTESUR NEGATIVE 01/07/2019 1533     ROS:  Pertinent items noted in HPI and remainder of comprehensive ROS otherwise negative.  Physical Exam: Vitals:   01/07/19 1948 01/08/19 0545  BP: (!) 165/87 (!) 155/91  Pulse: 77 74  Resp: 16 16  Temp: 98.6 F (37 C) 98 F (36.7 C)  SpO2: 99% 97%     Due to the nature of this patient's suspected COVID-19 with isolation and in keeping with efforts to prevent the spread of infection and to conserve personal protective equipment, a physical exam was not personally performed.  Patient's symptoms and exam were discussed in detail with the RN.  A chart review of other providers notes and the patient's lab work as well as review of other pertinent studies was performed.  Exam details from prior documentation were reviewed specifically and confirmed with the bedside nurse.  [facility] of service: [facility]   Examined from the door and called the patient on the phone.   General:  Adult female in bed in NAD  HEENT: NCAT  Heart: regular rate  Lungs: clear to auscultation per nursing exam and  unlabored per mine; not on supplemental oxygen  Abdomen: softly distended per nursing exam Extremities: non pitting edema per nursing exam  Neuro: alert and oriented x 3; conversant and provides a history  Psych normal mood and affect  Assessment/Plan:  # AKI  - Pre-renal insults in the setting of nausea; lisinopril  - Please avoid lisinopril and would also hold jardiance  - No acute indication for dialysis but will monitor closely for need - IV fluids as tolerated by respiratory status  - Check urine protein/cr ratio and repeat urinalysis  - Strict intake and output  - Check post-void residual bladder scan and place foley if retains over 300 mL - Early AM renal profile  - Should renal function fail to improve or worsen she reports that she would want dialysis if indicated   # CKD stage IV  - Previous baseline Cr 2.0- 2.3; had risen to 2.9 in 11/2018 - Possible component of CKD progression noting GFR 20 since last fall until earlier in May  # HTN with CKD  - Follow on current regimen for now; has IV PRN   # Metabolic acidosis - 2/2 CKD  - Supplemental bicarb is ordered   # Anemia with CKD  - Felt secondary to relative iron deficiency and CKD  - s/p IV iron on 6/15 and followed by hem/onc - Dr. Delton Coombes  - She is on procrit as well - dosed at 20,000 units every 3 weeks (just changed from 20,000 units every 2 weeks).  Pt estimates she last received on 6/1.   - Will administer aranesp 60 mcg once - No acute indication for transfusion    # Proteinuria, hematuria - Quantify urine protein losses, repeat UA and will review OSH records to determine baseline labs.  Given GFR of 20 at baseline not a candidate for immunosuppression   # Osteoporosis -  Note current hypocalcemia - Would avoid prolia with her GFR as concern for exacerbating hypocalcemia  # Diarrhea  - Supportive care per primary team  - GI pathogen panel and C dif have been ordered   Claudia Desanctis 01/08/2019, 8:14  AM

## 2019-01-08 NOTE — Progress Notes (Signed)
Patient ID: Felicia Williams, female   DOB: 1940/11/08, 78 y.o.   MRN: [id number] Shawna Clamp Per RN  6 loose stools  Yesterday. Slight nausea. No fever, no chills, no abd pain, no brbpr.   A/P Diarrhea Stool for GI pathogen panel Stool for C. Diff Imodium 2mg  po x1

## 2019-01-08 NOTE — Progress Notes (Signed)
PROGRESS NOTE    Felicia Williams  TIR:[id number] DOB: [birth year] DOA: 01/07/2019 PCP: Octavio Graves, DO   Brief Narrative:  Per QPY:[id number] Felicia Williams is an 78 y.o. 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 Gomez Cleverly at [facility] who is also on a clinical trial with Jardiance to see if it will improve her renal function even though she does not have diabetes.  The clinical trial is administered by Dr. Joseph Berkshire also at [facility] and study nurse is [place] phone [phone].  Patient was in her usual state of health until about a week ago when she started noticing worse than usual fatigue associated with some anorexia and some nausea.  She has had no vomiting.  She called the study nurse who asked her to get her kidney function checked.  She was called today and was told that her creatinine had increased from 2.9-5.  She was asked to come to the emergency room for evaluation.  At present patient admits to ongoing anorexia and nausea.  No vomiting.  She also has fatigue if she has had for several months although worse over the past week.  Patient has a mild headache.  No fevers or chills.  No abdominal pain.  No hematuria.  Patient is continuing to urinate without change.   Assessment & Plan:   Principal Problem:   ARF (acute renal failure) (HCC) Active Problems:   Essential hypertension, benign   Glomerulonephritis   Tachycardia, unspecified   AKI on CKD stage IV -Likely prerenal in the setting of nausea and lisinopril use as well as potential Jardiance use -Appreciate nephrology input with no need for dialysis currently -Continue monitor a.m. renal profile as well as strict I's and O's  N/V/D - some symptoms likely related to AKI -GI panel pending with Imodium ordered as needed -Symptoms do appear to be improving some today  Anemia secondary to CKD and iron deficiency -Aranesp per nephrology -Monitor  repeat CBC with no overt bleeding identified  Hypertension -Continue current medications with adequate control noted -IV medications ordered as needed   DVT prophylaxis: Heparin Code Status: Full Family Communication: None at bedside Disposition Plan: Continue IV fluid and treat AKI.   Consultants:   Nephrology  Procedures:   None  Antimicrobials:   None   Subjective: Patient seen and evaluated today with no new acute complaints or concerns. No acute concerns or events noted overnight.  She has less nausea and vomiting and diarrhea.  She is tolerating her diet.  Objective: Vitals:   01/07/19 1800 01/07/19 1830 01/07/19 1948 01/08/19 0545  BP: (!) 195/75 (!) 184/74 (!) 165/87 (!) 155/91  Pulse: 82 80 77 74  Resp: (!) 21 18 16 16   Temp:   98.6 F (37 C) 98 F (36.7 C)  TempSrc:   Oral Oral  SpO2: 96% 97% 99% 97%  Weight:   55.1 kg     Intake/Output Summary (Last 24 hours) at 01/08/2019 1323 Last data filed at 01/08/2019 0500 Gross per 24 hour  Intake 896.94 ml  Output -  Net 896.94 ml   Filed Weights   01/07/19 1948  Weight: 55.1 kg    Examination:  General exam: Appears calm and comfortable  Respiratory system: Clear to auscultation. Respiratory effort normal. Cardiovascular system: S1 & S2 heard, RRR. No JVD, murmurs, rubs, gallops or clicks. No pedal edema. Gastrointestinal system: Abdomen is nondistended, soft and nontender. No organomegaly or masses felt. Normal bowel sounds  heard. Central nervous system: Alert and oriented. No focal neurological deficits. Extremities: Symmetric 5 x 5 power. Skin: No rashes, lesions or ulcers Psychiatry: Judgement and insight appear normal. Mood & affect appropriate.     Data Reviewed: I have personally reviewed following labs and imaging studies  CBC: Recent Labs  Lab 01/07/19 1342 01/08/19 0546  WBC 5.4 5.0  NEUTROABS 3.9  --   HGB 9.7* 8.7*  HCT 30.6* 28.3*  MCV 92.4 93.7  PLT 196 782   Basic  Metabolic Panel: Recent Labs  Lab 01/07/19 1342 01/08/19 0546  NA 137 141  K 4.7 4.5  CL 107 114*  CO2 16* 19*  GLUCOSE 83 81  BUN 80* 79*  CREATININE 6.42* 6.34*  CALCIUM 7.6* 7.3*   GFR: CrCl cannot be calculated (Unknown ideal weight.). Liver Function Tests: Recent Labs  Lab 01/07/19 1342 01/08/19 0546  AST 22 19  ALT 16 14  ALKPHOS 56 47  BILITOT 0.6 0.7  PROT 5.7* 4.8*  ALBUMIN 2.4* 2.1*   No results for input(s): LIPASE, AMYLASE in the last 168 hours. No results for input(s): AMMONIA in the last 168 hours. Coagulation Profile: No results for input(s): INR, PROTIME in the last 168 hours. Cardiac Enzymes: Recent Labs  Lab 01/07/19 1546  CKTOTAL 90   BNP (last 3 results) No results for input(s): PROBNP in the last 8760 hours. HbA1C: No results for input(s): HGBA1C in the last 72 hours. CBG: No results for input(s): GLUCAP in the last 168 hours. Lipid Profile: No results for input(s): CHOL, HDL, LDLCALC, TRIG, CHOLHDL, LDLDIRECT in the last 72 hours. Thyroid Function Tests: No results for input(s): TSH, T4TOTAL, FREET4, T3FREE, THYROIDAB in the last 72 hours. Anemia Panel: No results for input(s): VITAMINB12, FOLATE, FERRITIN, TIBC, IRON, RETICCTPCT in the last 72 hours. Sepsis Labs: No results for input(s): PROCALCITON, LATICACIDVEN in the last 168 hours.  No results found for this or any previous visit (from the past 240 hour(s)).       Radiology Studies: US Renal  Result Date: 01/07/2019 CLINICAL DATA:  Acute renal failure EXAM: RENAL / URINARY TRACT ULTRASOUND COMPLETE COMPARISON:  None. FINDINGS: Right Kidney: Renal measurements: 9.9 x 4.6 x 4.9 cm = volume: 115.3 mL. Increased cortical echogenicity Left Kidney: Renal measurements: 9.9 x 4.9 x 5.0 cm = volume: 127.5 mL. Increased cortical echogenicity Bladder: Appears normal for degree of bladder distention. IMPRESSION: Increased cortical echogenicity bilaterally consistent with medical renal  disease. No hydronephrosis or other abnormality. Electronically Signed   By: Dorise Bullion III M.D   On: 01/07/2019 16:27        Scheduled Meds: . carvedilol  12.5 mg Oral BID WC  . heparin  5,000 Units Subcutaneous Q8H  . hydrALAZINE  50 mg Oral BID  . NIFEdipine  30 mg Oral Daily  . pantoprazole  40 mg Oral Daily  . pneumococcal 23 valent vaccine  0.5 mL Intramuscular Tomorrow-1000  . sodium bicarbonate  650 mg Oral BID   Continuous Infusions: . sodium chloride 100 mL/hr at 01/08/19 1047     LOS: 1 day    Time spent: 30 minutes    Pratik Darleen Crocker, DO [facility] Pager [phone]  If 7PM-7AM, please contact night-coverage [email or web address] Password [email or web address] 01/08/2019, 1:23 PM

## 2019-01-09 LAB — RENAL FUNCTION PANEL
Albumin: 2.1 g/dL — ABNORMAL LOW (ref 3.5–5.0)
Anion gap: 8 (ref 5–15)
BUN: 74 mg/dL — ABNORMAL HIGH (ref 8–23)
CO2: 19 mmol/L — ABNORMAL LOW (ref 22–32)
Calcium: 6.9 mg/dL — ABNORMAL LOW (ref 8.9–10.3)
Chloride: 113 mmol/L — ABNORMAL HIGH (ref 98–111)
Creatinine, Ser: 6.51 mg/dL — ABNORMAL HIGH (ref 0.44–1.00)
GFR calc Af Amer: 6 mL/min — ABNORMAL LOW (ref >60)
GFR calc non Af Amer: 6 mL/min — ABNORMAL LOW (ref >60)
Glucose, Bld: 92 mg/dL (ref 70–99)
Phosphorus: 8.3 mg/dL — ABNORMAL HIGH (ref 2.5–4.6)
Potassium: 4.8 mmol/L (ref 3.5–5.1)
Sodium: 140 mmol/L (ref 135–145)

## 2019-01-09 LAB — CBC
HCT: 27.9 % — ABNORMAL LOW (ref 36.0–46.0)
Hemoglobin: 8.7 g/dL — ABNORMAL LOW (ref 12.0–15.0)
MCH: 29.8 pg (ref 26.0–34.0)
MCHC: 31.2 g/dL (ref 30.0–36.0)
MCV: 95.5 fL (ref 80.0–100.0)
Platelets: 182 10*3/uL (ref 150–400)
RBC: 2.92 MIL/uL — ABNORMAL LOW (ref 3.87–5.11)
RDW: 13.9 % (ref 11.5–15.5)
WBC: 6.3 10*3/uL (ref 4.0–10.5)
nRBC: 0 % (ref 0.0–0.2)

## 2019-01-09 MED ORDER — SODIUM BICARBONATE 650 MG PO TABS
1300.0000 mg | ORAL_TABLET | Freq: Two times a day (BID) | ORAL | Status: DC
  Administered 2019-01-09 – 2019-01-11 (×5): 1300 mg via ORAL
  Filled 2019-01-09 (×5): qty 2

## 2019-01-09 MED ORDER — SODIUM CHLORIDE 0.9 % IV SOLN
INTRAVENOUS | Status: DC
  Administered 2019-01-09 – 2019-01-10 (×3): via INTRAVENOUS

## 2019-01-09 MED ORDER — CALCIUM ACETATE (PHOS BINDER) 667 MG PO CAPS
667.0000 mg | ORAL_CAPSULE | Freq: Three times a day (TID) | ORAL | Status: DC
  Administered 2019-01-09 – 2019-01-12 (×8): 667 mg via ORAL
  Filled 2019-01-09 (×8): qty 1

## 2019-01-09 NOTE — Progress Notes (Signed)
BP at 1619 186/76, scheduled Coreg given. Patient also c/o pain, scheduled lidocaine patch and PRN Tramadol given. BP rechecked at 1734, 178/72, PRN IV Hydralazine given. Patient reports improved pain, but now c/o nausea. PRN Zofran given as well. Will continue to monitor.

## 2019-01-09 NOTE — Care Management Important Message (Signed)
Important Message  Patient Details  Name: Felicia Williams MRN: [id number] Date of Birth: [birth year]   Medicare Important Message Given:  Yes     Tommy Medal 01/09/2019, 2:58 PM

## 2019-01-09 NOTE — Progress Notes (Signed)
[facility] Progress Note  Name: Felicia Williams MRN: [id number] DOB: [birth year]  Chief Complaint:  Fatigue, nausea, diarrhea  Subjective:  SARS-CoV-2 not detected.  Seen in person today.  Fluids were stopped early AM.  No BM yesterday and no nausea today or yesterday.  Strict ins and outs are not available - just started collecting yesterday midday but had 39mL UOP charted.  She would certainly hope to hold off on HD until needed.  She is willing to start when needed and would eventually hope to do home dialysis if possible.  We discussed low phos diet - two sodas at bedside.  Interim review of OSH records.  Lisinopril was 40 mg BID previously.  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  Up/cr ratio 2342 mg/g in 08/2018 and UA with 23-30 RBC in 06/2018 and 03/2018 with TNTC RBC.  Review of systems:  Denies shortness of breath or chest pain Denies nausea today or yesterday  Diarrhea is better  ---------------------- Background on consult:  Felicia Williams is a 78 y.o. female with a history of CKD stage IV followed by Dr. Al Williams at [facility] who presented to [facility] on 6/17 with nausea, diarrhea, and fatigue/weakness which has been worsening over the past week.  She had also had outpatient labs which indicated that her creatinine had increased to 4.89 per charting and they recommended she come to the ER where her Cr was found to be 6.42.  Her creatinine was 2.91 on 11/26/18 and Cr had been 2.0 - 2.3 with a GFR around 20 since 03/2018 before that time.  Nausea is better now and she was able to eat some breakfast this morning.  Diarrhea is still an issue and has been going on for "a long time" - over a year.  She has been on immodium daily PRN for the same.  She has received  gentle hydration here with current fluids at 50.  Initially had swelling over the weekend and this is actually improved.  No urine output is charted but she states that she has been "urinating normally".  She states that a rheumatologist recently diagnosed her with RA and she was given a steroid pack a week and a half ago.  No other new meds that she recalls.  No fever or rash.  No hemoptysis.  She has been short of breath for a couple of years; she would characterize her breathing as at baseline currently.  No blood per rectum or dark stools.  Last dose of Jardiance was Sunday night and last dose of lisinopril was Sunday night as well; normally on lisinopril 40 mg BID.  She normally does take lasix at home and last dose was Sunday night.  She does not have dialysis access; her provider has broached the topic of dialysis before.  She would want dialysis if indicated but is encouraged to hear we might be able to hold off at least for the time being.  She has been in a trial for Jardiance through Felicia Williams at [facility] for the past several months and this has been held as well.  She is also followed by Felicia Williams in hem/onc and had recent IV iron infusion on 6/15 and states last retacrit was on 6/1.   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  Up/cr ratio 2342 mg/g in 08/2018 and UA with 23-30 RBC in 06/2018 and 03/2018 with TNTC RBC.  She denies ever taking any steroids or immunosuppression for her kidneys or any chemo agents. Work-up here notable for renal US with 9.9 cm kidneys bilaterally and no hydro.  CK level 90.  Urinalysis with proteinuria and hematuria.      Intake/Output Summary (Last 24 hours) at 01/09/2019 0840 Last data filed at 01/09/2019 0500 Gross per 24 hour  Intake 1636.63 ml  Output 350  ml  Net 1286.63 ml    Vitals:  Vitals:   01/08/19 0545 01/08/19 1645 01/08/19 2115 01/09/19 0530  BP: (!) 155/91 140/61 137/60 (!) 151/68  Pulse: 74 72 70 77  Resp: 16 20 17 17   Temp: 98 F (36.7 C) 98.5 F (36.9 C) 98.7 F (37.1 C) 98.3 F (36.8 C)  TempSrc: Oral Oral Oral Oral  SpO2: 97% 98% 98% 97%  Weight:         Physical Exam:  General adult female in bed in no acute distress HEENT normocephalic atraumatic extraocular movements intact sclera anicteric Neck supple trachea midline Lungs clear to auscultation bilaterally normal work of breathing at rest  Heart regular rate and rhythm no rubs or gallops appreciated Abdomen soft nontender nondistended Extremities no pitting edema  Psych normal mood and affect GU no foley  Medications reviewed   Labs:  BMP Latest Ref Rng & Units 01/09/2019 01/08/2019 01/07/2019  Glucose 70 - 99 mg/dL 92 81 83  BUN 8 - 23 mg/dL 74(H) 79(H) 80(H)  Creatinine 0.44 - 1.00 mg/dL 6.51(H) 6.34(H) 6.42(H)  Sodium 135 - 145 mmol/L 140 141 137  Potassium 3.5 - 5.1 mmol/L 4.8 4.5 4.7  Chloride 98 - 111 mmol/L 113(H) 114(H) 107  CO2 22 - 32 mmol/L 19(L) 19(L) 16(L)  Calcium 8.9 - 10.3 mg/dL 6.9(L) 7.3(L) 7.6(L)     Assessment/Plan:   # AKI  - Pre-renal insults in the setting of nausea, diarrhea; lisinopril.  She is now off of lisinopril and jardiance.  Possible CKD progression as below but also with potentially reversible pre-renal insults.  Post void residual was ok at 93 on 6/18. - No nausea today or yesterday; no uremic symptoms - Resume IV fluids - Discontinued nifedipine to avoid relative hypotension - She may ultimately have just had CKD progression.  No acute indication for dialysis today but will monitor closely for need.  Should renal function fail to improve by 6/22 discussed that she would need to start dialysis at that time and she does agree with this plan  - Strict intake and output   # CKD stage IV  - Secondary to fibrillary  GN - Previous baseline Cr 2.0- 2.3; had risen to 2.9 in 11/2018.  (GFR 20 since last fall until earlier in May).  Given GFR of 20 at baseline not a candidate for immunosuppression   # HTN with CKD  - Would avoid relative hypotension - she was as high as low 200's on admit and nadir 130's yesterday - have discontinued nifedipine for now   # Metabolic acidosis  - 2/2 CKD  - On supplemental bicarb - increase dose  # Anemia with CKD  - Felt secondary to relative iron deficiency and CKD  - s/p IV iron on 6/15 and followed by hem/onc - Felicia Williams  - She is  on procrit as well - dosed at 20,000 units every 3 weeks (just changed from 20,000 units every 2 weeks).  Pt estimates she last received on 6/1.   - s/p aranesp 60 mcg once on 6/18 - No acute indication for transfusion    # Proteinuria, hematuria 2/2 fibrillary GN - UP/cr ratio 6060 mg/g and hematuria - Note she has proteinuria and hematuria at her baseline.  Up/cr ratio was 2342 mg/g in 08/2018 and UA with 23-30 RBC in 06/2018 & 03/2018 with TNTC RBC. - Given GFR of 20 at baseline not a candidate for immunosuppression   # Hyperphosphatemia  - 2/2 AKI  - Discussed low phos diet including stopping dark sodas - Start phoslo with meals - other bone/mineral - check intact PTH  # Osteoporosis - Note current hypocalcemia - Would avoid prolia with her GFR as concern for exacerbating hypocalcemia; would not resume as outpatient  # Diarrhea   - Supportive care per primary team    Claudia Desanctis, MD 01/09/2019 8:40 AM

## 2019-01-09 NOTE — Progress Notes (Signed)
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 Cristi Loron who is also on a clinical trial with Jardiance to see if it will improve her renal function even though she does not have diabetes. The clinical trial is administered by Dr. Joseph Berkshire also at [facility] and study nurse is Taney phone [phone].  Patient was in her usual state of health until about a week ago when she started noticing worse than usual fatigue associated with some anorexia and some nausea. She has had no vomiting. She called the study nurse who asked her to get her kidney function checked. She was called today and was told that her creatinine had increased from 2.9-5. She was asked to come to the emergency room for evaluation.  At present patient admits to ongoing anorexia and nausea. No vomiting. She also has fatigue if she has had for several months although worse over the past week. Patient has a mild headache. No fevers or chills. No abdominal pain. No hematuria. Patient is continuing to urinate, but creatinine level has not changed.  Assessment & Plan:   Principal Problem:   ARF (acute renal failure) (HCC) Active Problems:   Essential hypertension, benign   Glomerulonephritis   Tachycardia, unspecified  AKI on CKD stage IV -Likely prerenal in the setting of nausea and lisinopril use as well as potential Jardiance use -Appreciate nephrology input with no need for dialysis currently -Continue monitor a.m. renal profile as well as strict I's and O's -May require initiation of hemodialysis soon if patient fails to improve -Appreciate further nephrology recommendations  N/V/D-resolved -Continue to monitor and continue current diet  Anemia  secondary to CKD and iron deficiency -Aranesp per nephrology -Monitor repeat CBC with no overt bleeding identified   Hypertension -Continue current medications with adequate control noted -IV medications ordered as needed -Nifedipine discontinued   DVT prophylaxis: Heparin Code Status: Full Family Communication: None at bedside Disposition Plan: Continue IV fluid and treat AKI with monitoring over the weekend and potential need for dialysis should kidney function not improve.   Consultants:   Nephrology  Procedures:   None  Antimicrobials:   None   Subjective: Patient seen and evaluated today with no new acute complaints or concerns. No acute concerns or events noted overnight.  She is tolerating her diet with no further nausea or vomiting noted.  No diarrhea noted.  Objective: Vitals:   01/08/19 0545 01/08/19 1645 01/08/19 2115 01/09/19 0530  BP: (!) 155/91 140/61 137/60 (!) 151/68  Pulse: 74 72 70 77  Resp: 16 20 17 17   Temp: 98 F (36.7 C) 98.5 F (36.9 C) 98.7 F (37.1 C) 98.3 F (36.8 C)  TempSrc: Oral Oral Oral Oral  SpO2: 97% 98% 98% 97%  Weight:        Intake/Output Summary (Last 24 hours) at 01/09/2019 1442 Last data filed at 01/09/2019 1300 Gross per 24 hour  Intake 1876.63 ml  Output 1050 ml  Net 826.63 ml   Filed Weights   01/07/19 1948  Weight: 55.1 kg    Examination:  General exam: Appears calm and comfortable  Respiratory system: Clear to auscultation. Respiratory effort normal. Cardiovascular system: S1 & S2 heard, RRR. No JVD, murmurs, rubs, gallops or clicks. No pedal edema. Gastrointestinal system: Abdomen is nondistended, soft  and nontender. No organomegaly or masses felt. Normal bowel sounds heard. Central nervous system: Alert and oriented. No focal neurological deficits. Extremities: Symmetric 5 x 5 power. Skin: No rashes, lesions or ulcers Psychiatry: Judgement and insight appear normal. Mood & affect appropriate.      Data Reviewed: I have personally reviewed following labs and imaging studies  CBC: Recent Labs  Lab 01/07/19 1342 01/08/19 0546 01/09/19 0635  WBC 5.4 5.0 6.3  NEUTROABS 3.9  --   --   HGB 9.7* 8.7* 8.7*  HCT 30.6* 28.3* 27.9*  MCV 92.4 93.7 95.5  PLT 196 186 003   Basic Metabolic Panel: Recent Labs  Lab 01/07/19 1342 01/08/19 0546 01/09/19 0635  NA 137 141 140  K 4.7 4.5 4.8  CL 107 114* 113*  CO2 16* 19* 19*  GLUCOSE 83 81 92  BUN 80* 79* 74*  CREATININE 6.42* 6.34* 6.51*  CALCIUM 7.6* 7.3* 6.9*  PHOS  --   --  8.3*   GFR: CrCl cannot be calculated (Unknown ideal weight.). Liver Function Tests: Recent Labs  Lab 01/07/19 1342 01/08/19 0546 01/09/19 0635  AST 22 19  --   ALT 16 14  --   ALKPHOS 56 47  --   BILITOT 0.6 0.7  --   PROT 5.7* 4.8*  --   ALBUMIN 2.4* 2.1* 2.1*   No results for input(s): LIPASE, AMYLASE in the last 168 hours. No results for input(s): AMMONIA in the last 168 hours. Coagulation Profile: No results for input(s): INR, PROTIME in the last 168 hours. Cardiac Enzymes: Recent Labs  Lab 01/07/19 1546  CKTOTAL 90   BNP (last 3 results) No results for input(s): PROBNP in the last 8760 hours. HbA1C: No results for input(s): HGBA1C in the last 72 hours. CBG: No results for input(s): GLUCAP in the last 168 hours. Lipid Profile: No results for input(s): CHOL, HDL, LDLCALC, TRIG, CHOLHDL, LDLDIRECT in the last 72 hours. Thyroid Function Tests: No results for input(s): TSH, T4TOTAL, FREET4, T3FREE, THYROIDAB in the last 72 hours. Anemia Panel: No results for input(s): VITAMINB12, FOLATE, FERRITIN, TIBC, IRON, RETICCTPCT in the last 72 hours. Sepsis Labs: No results for input(s): PROCALCITON, LATICACIDVEN in the last 168 hours.  Recent Results (from the past 240 hour(s))  Novel Coronavirus,NAA,(SEND-OUT TO REF LAB - TAT 24-48 hrs); Hosp Order     Status: None   Collection Time: 01/07/19  5:46 PM   Specimen: Nasopharyngeal Swab;  Respiratory  Result Value Ref Range Status   SARS-CoV-2, NAA NOT DETECTED NOT DETECTED Final    Comment: (NOTE) This test was developed and its performance characteristics determined by [employer]. This test has not been FDA cleared or approved. This test has been authorized by FDA under an Emergency Use Authorization (EUA). This test is only authorized for the duration of time the declaration that circumstances exist justifying the authorization of the emergency use of in vitro diagnostic tests for detection of SARS-CoV-2 virus and/or diagnosis of COVID-19 infection under section 564(b)(1) of the Act, 21 U.S.C. 491PHX-5(A)(5), unless the authorization is terminated or revoked sooner. When diagnostic testing is negative, the possibility of a false negative result should be considered in the context of a patient's recent exposures and the presence of clinical signs and symptoms consistent with COVID-19. An individual without symptoms of COVID-19 and who is not shedding SARS-CoV-2 virus would expect to have a negative (not detected) result in this assay. Performed  At: [facility] [place], [place]  [id number] Rush Farmer MD KG:[id number]    Coronavirus Source NASOPHARYNGEAL  Final    Comment: Performed at [facility], [address] [place], [place] [address]         Radiology Studies: US Renal  Result Date: 01/07/2019 CLINICAL DATA:  Acute renal failure EXAM: RENAL / URINARY TRACT ULTRASOUND COMPLETE COMPARISON:  None. FINDINGS: Right Kidney: Renal measurements: 9.9 x 4.6 x 4.9 cm = volume: 115.3 mL. Increased cortical echogenicity Left Kidney: Renal measurements: 9.9 x 4.9 x 5.0 cm = volume: 127.5 mL. Increased cortical echogenicity Bladder: Appears normal for degree of bladder distention. IMPRESSION: Increased cortical echogenicity bilaterally consistent with medical renal disease. No hydronephrosis or other abnormality. Electronically Signed    By: Dorise Bullion III M.D   On: 01/07/2019 16:27        Scheduled Meds: . calcium acetate  667 mg Oral TID WC  . carvedilol  12.5 mg Oral BID WC  . heparin  5,000 Units Subcutaneous Q8H  . hydrALAZINE  50 mg Oral BID  . lidocaine  1 patch Transdermal Q24H  . pantoprazole  40 mg Oral Daily  . pneumococcal 23 valent vaccine  0.5 mL Intramuscular Tomorrow-1000  . sodium bicarbonate  1,300 mg Oral BID   Continuous Infusions: . sodium chloride 100 mL/hr at 01/09/19 0939     LOS: 2 days    Time spent: 30 minutes    Vikas Wegmann Darleen Crocker, DO [facility] Pager [phone]  If 7PM-7AM, please contact night-coverage [email or web address] Password [email or web address] 01/09/2019, 2:42 PM

## 2019-01-10 LAB — CBC
HCT: 28.3 % — ABNORMAL LOW (ref 36.0–46.0)
Hemoglobin: 8.8 g/dL — ABNORMAL LOW (ref 12.0–15.0)
MCH: 29.5 pg (ref 26.0–34.0)
MCHC: 31.1 g/dL (ref 30.0–36.0)
MCV: 95 fL (ref 80.0–100.0)
Platelets: 177 10*3/uL (ref 150–400)
RBC: 2.98 MIL/uL — ABNORMAL LOW (ref 3.87–5.11)
RDW: 14 % (ref 11.5–15.5)
WBC: 5.7 10*3/uL (ref 4.0–10.5)
nRBC: 0 % (ref 0.0–0.2)

## 2019-01-10 LAB — RENAL FUNCTION PANEL
Albumin: 2.1 g/dL — ABNORMAL LOW (ref 3.5–5.0)
Anion gap: 10 (ref 5–15)
BUN: 68 mg/dL — ABNORMAL HIGH (ref 8–23)
CO2: 15 mmol/L — ABNORMAL LOW (ref 22–32)
Calcium: 6.9 mg/dL — ABNORMAL LOW (ref 8.9–10.3)
Chloride: 114 mmol/L — ABNORMAL HIGH (ref 98–111)
Creatinine, Ser: 6.21 mg/dL — ABNORMAL HIGH (ref 0.44–1.00)
GFR calc Af Amer: 7 mL/min — ABNORMAL LOW (ref >60)
GFR calc non Af Amer: 6 mL/min — ABNORMAL LOW (ref >60)
Glucose, Bld: 85 mg/dL (ref 70–99)
Phosphorus: 7 mg/dL — ABNORMAL HIGH (ref 2.5–4.6)
Potassium: 4.5 mmol/L (ref 3.5–5.1)
Sodium: 139 mmol/L (ref 135–145)

## 2019-01-10 LAB — SEDIMENTATION RATE: Sed Rate: 80 mm/hr — ABNORMAL HIGH (ref 0–22)

## 2019-01-10 LAB — C-REACTIVE PROTEIN: CRP: 1 mg/dL — ABNORMAL HIGH (ref <1.0)

## 2019-01-10 MED ORDER — SODIUM BICARBONATE 8.4 % IV SOLN
INTRAVENOUS | Status: DC
  Administered 2019-01-10 – 2019-01-11 (×2): via INTRAVENOUS
  Filled 2019-01-10 (×2): qty 150

## 2019-01-10 MED ORDER — ONDANSETRON 4 MG PO TBDP
4.0000 mg | ORAL_TABLET | Freq: Three times a day (TID) | ORAL | Status: DC | PRN
  Administered 2019-01-10: 4 mg via ORAL
  Filled 2019-01-10: qty 1

## 2019-01-10 MED ORDER — PNEUMOCOCCAL VAC POLYVALENT 25 MCG/0.5ML IJ INJ
0.5000 mL | INJECTION | INTRAMUSCULAR | Status: AC
  Administered 2019-01-12: 0.5 mL via INTRAMUSCULAR
  Filled 2019-01-10: qty 0.5

## 2019-01-10 MED ORDER — PREDNISONE 20 MG PO TABS
30.0000 mg | ORAL_TABLET | Freq: Every day | ORAL | Status: DC
  Administered 2019-01-10 – 2019-01-12 (×3): 30 mg via ORAL
  Filled 2019-01-10 (×3): qty 1

## 2019-01-10 NOTE — Progress Notes (Signed)
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 Cristi Loron who is also on a clinical trial with Jardiance to see if it will improve her renal function even though she does not have diabetes. The clinical trial is administered by Dr. Joseph Berkshire also at [facility] and study nurse is [place] phone [phone].  Patient was in her usual state of health until about a week ago when she started noticing worse than usual fatigue associated with some anorexia and some nausea. She has had no vomiting. She called the study nurse who asked her to get her kidney function checked. She was called today and was told that her creatinine had increased from 2.9-5. She was asked to come to the emergency room for evaluation.  At present patient admits to ongoing anorexia and nausea. No vomiting. She also has fatigue if she has had for several months although worse over the past week. Patient has a mild headache. No fevers or chills. No abdominal pain. No hematuria. Patient is continuing to urinate, but has some worsening nausea today as well as elevated blood pressures overnight.  Creatinine appears to be improving some.   Assessment & Plan:   Principal Problem:   ARF (acute renal failure) (HCC) Active Problems:   Essential hypertension, benign   Glomerulonephritis   Tachycardia, unspecified   AKI on CKD stage IV -Likely prerenal in the setting of nausea and lisinopril use as well as potential Jardiance use -Appreciate nephrology input with no need for dialysis currently -Continue monitor a.m. renal profile as well as strict I's and O's, will add daily weights -May require initiation of hemodialysis soon if patient fails to improve -Appreciate further  nephrology recommendations today -Maintain on IV fluid  N/V/D - mild nausea noted today.  We will continue with Zofran and continue on IV fluid -Continue to monitor and continue current diet  Anemia secondary to CKD and iron deficiency -Aranesp per nephrology -Monitor repeat CBC with no overt bleeding identified   Hypertension -Continue current medications with adequate control noted -IV medications ordered as needed with elevations noted overnight -Nifedipine discontinued by nephrology 6/19  Suspected RA flare -ESR and CRP elevated -We will start prednisone 30 mg and 0.5 mg/kilogram dosing   DVT prophylaxis:Heparin Code Status:Full Family Communication:None at bedside Disposition Plan:Continue IV fluid and treat AKI with monitoring over the weekend and potential need for dialysis should kidney function not improve.  Prednisone started for RA flare.   Consultants:  Nephrology  Procedures:  None  Antimicrobials:   None   Subjective: Patient seen and evaluated today with worsening bilateral hand pain and stiffness as well as some swelling.  She feels that this is secondary to her rheumatoid arthritis which she has apparently been diagnosed with recently.  Objective: Vitals:   01/09/19 2133 01/10/19 0531 01/10/19 0820 01/10/19 1318  BP: (!) 153/52 (!) 188/78 (!) 188/77 (!) 173/68  Pulse: 73 73 73 72  Resp: _0 Temp: 98.7 F (37.1 C) 98.4 F (36.9 C) 98.4 F (36.9 C) 98.2 F (36.8 C)  TempSrc: Oral Oral Oral Oral  SpO2: 96% 99% 98% 94%  Weight:        Intake/Output Summary (Last 24 hours) at 01/10/2019 1413 Last data filed at 01/10/2019 0823 Gross per 24 hour  Intake 1346.38 ml  Output 200 ml  Net 1146.38 ml   Filed Weights   01/07/19 1948  Weight: 55.1 kg    Examination:  General exam: Appears calm and comfortable  Respiratory system: Clear to auscultation. Respiratory effort normal. Cardiovascular system: S1 & S2 heard,  RRR. No JVD, murmurs, rubs, gallops or clicks. No pedal edema. Gastrointestinal system: Abdomen is nondistended, soft and nontender. No organomegaly or masses felt. Normal bowel sounds heard. Central nervous system: Alert and oriented. No focal neurological deficits. Extremities: Upper extremity swelling with stiffness noted in joints of fingers and wrists. Skin: No rashes, lesions or ulcers Psychiatry: Judgement and insight appear normal. Mood & affect appropriate.     Data Reviewed: I have personally reviewed following labs and imaging studies  CBC: Recent Labs  Lab 01/07/19 1342 01/08/19 0546 01/09/19 0635 01/10/19 0632  WBC 5.4 5.0 6.3 5.7  NEUTROABS 3.9  --   --   --   HGB 9.7* 8.7* 8.7* 8.8*  HCT 30.6* 28.3* 27.9* 28.3*  MCV 92.4 93.7 95.5 95.0  PLT 196 186 182 606   Basic Metabolic Panel: Recent Labs  Lab 01/07/19 1342 01/08/19 0546 01/09/19 0635 01/10/19 0632  NA 137 141 140 139  K 4.7 4.5 4.8 4.5  CL 107 114* 113* 114*  CO2 16* 19* 19* 15*  GLUCOSE 83 81 92 85  BUN 80* 79* 74* 68*  CREATININE 6.42* 6.34* 6.51* 6.21*  CALCIUM 7.6* 7.3* 6.9* 6.9*  PHOS  --   --  8.3* 7.0*   GFR: CrCl cannot be calculated (Unknown ideal weight.). Liver Function Tests: Recent Labs  Lab 01/07/19 1342 01/08/19 0546 01/09/19 0635 01/10/19 0632  AST 22 19  --   --   ALT 16 14  --   --   ALKPHOS 56 47  --   --   BILITOT 0.6 0.7  --   --   PROT 5.7* 4.8*  --   --   ALBUMIN 2.4* 2.1* 2.1* 2.1*   No results for input(s): LIPASE, AMYLASE in the last 168 hours. No results for input(s): AMMONIA in the last 168 hours. Coagulation Profile: No results for input(s): INR, PROTIME in the last 168 hours. Cardiac Enzymes: Recent Labs  Lab 01/07/19 1546  CKTOTAL 90   BNP (last 3 results) No results for input(s): PROBNP in the last 8760 hours. HbA1C: No results for input(s): HGBA1C in the last 72 hours. CBG: No results for input(s): GLUCAP in the last 168 hours. Lipid  Profile: No results for input(s): CHOL, HDL, LDLCALC, TRIG, CHOLHDL, LDLDIRECT in the last 72 hours. Thyroid Function Tests: No results for input(s): TSH, T4TOTAL, FREET4, T3FREE, THYROIDAB in the last 72 hours. Anemia Panel: No results for input(s): VITAMINB12, FOLATE, FERRITIN, TIBC, IRON, RETICCTPCT in the last 72 hours. Sepsis Labs: No results for input(s): PROCALCITON, LATICACIDVEN in the last 168 hours.  Recent Results (from the past 240 hour(s))  Novel Coronavirus,NAA,(SEND-OUT TO REF LAB - TAT 24-48 hrs); Hosp Order     Status: None   Collection Time: 01/07/19  5:46 PM   Specimen: Nasopharyngeal Swab; Respiratory  Result Value Ref Range Status   SARS-CoV-2, NAA NOT DETECTED NOT DETECTED Final    Comment: (NOTE) This test was developed and its performance characteristics determined by [employer]. This test has not been FDA cleared or approved. This test has been authorized by FDA under an Emergency Use Authorization (EUA). This test is only authorized for the duration of time the declaration  that circumstances exist justifying the authorization of the emergency use of in vitro diagnostic tests for detection of SARS-CoV-2 virus and/or diagnosis of COVID-19 infection under section 564(b)(1) of the Act, 21 U.S.C. 144RXV-4(M)(0), unless the authorization is terminated or revoked sooner. When diagnostic testing is negative, the possibility of a false negative result should be considered in the context of a patient's recent exposures and the presence of clinical signs and symptoms consistent with COVID-19. An individual without symptoms of COVID-19 and who is not shedding SARS-CoV-2 virus would expect to have a negative (not detected) result in this assay. Performed  At: [facility] [place], [place] [id number] Rush Farmer MD TO:[phone]    [place]  Final    Comment: Performed at [facility], [address],  [place], [place] [address]         Radiology Studies: No results found.      Scheduled Meds: . calcium acetate  667 mg Oral TID WC  . carvedilol  12.5 mg Oral BID WC  . heparin  5,000 Units Subcutaneous Q8H  . hydrALAZINE  50 mg Oral BID  . lidocaine  1 patch Transdermal Q24H  . pantoprazole  40 mg Oral Daily  . [START ON 01/12/2019] pneumococcal 23 valent vaccine  0.5 mL Intramuscular Tomorrow-1000  . predniSONE  30 mg Oral Q breakfast  . sodium bicarbonate  1,300 mg Oral BID   Continuous Infusions: . sodium chloride 100 mL/hr at 01/10/19 0557     LOS: 3 days    Time spent: 30 minutes    Javarian Jakubiak Darleen Crocker, DO [facility] Pager [phone]  If 7PM-7AM, please contact night-coverage [email or web address] Password [email or web address] 01/10/2019, 2:13 PM

## 2019-01-10 NOTE — Progress Notes (Signed)
[facility] Progress Note  Name: Felicia Williams MRN: [id number] DOB: [birth year]  Chief Complaint:  Fatigue, nausea, diarrhea  Subjective:  Seen remotely.  Urine output improved.  BUN/Cr improved as well.    ---------------------- Background on consult:  Kimimila Tauzin is a 78 y.o. female with a history of CKD stage IV followed by Dr. Al Corpus at [facility] who presented to [facility] on 6/17 with nausea, diarrhea, and fatigue/weakness which has been worsening over the past week.  She had also had outpatient labs which indicated that her creatinine had increased to 4.89 per charting and they recommended she come to the ER where her Cr was found to be 6.42.  Her creatinine was 2.91 on 11/26/18 and Cr had been 2.0 - 2.3 with a GFR around 20 since 03/2018 before that time.  Nausea is better now and she was able to eat some breakfast this morning.  Diarrhea is still an issue and has been going on for "a long time" - over a year.  She has been on immodium daily PRN for the same.  She has received gentle hydration here with current fluids at 50.  Initially had swelling over the weekend and this is actually improved.  No urine output is charted but she states that she has been "urinating normally".  She states that a rheumatologist recently diagnosed her with RA and she was given a steroid pack a week and a half ago.  No other new meds that she recalls.  No fever or rash.  No hemoptysis.  She has been short of breath for a couple of years; she would characterize her breathing as at baseline currently.  No blood per rectum or dark stools.  Last dose of Jardiance was Sunday night and last dose of lisinopril was Sunday night as well; normally on lisinopril 40 mg BID.  She normally does take lasix at home and last dose was Sunday night.  She does not have dialysis access; her provider has broached the topic of dialysis before.  She would want dialysis if indicated but is encouraged to hear we might be  able to hold off at least for the time being.  She has been in a trial for Jardiance through Dr. Joseph Berkshire at [facility] for the past several months and this has been held as well.  She is also followed by Dr. Delton Coombes in hem/onc and had recent IV iron infusion on 6/15 and states last retacrit was on 6/1.   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  Up/cr ratio 2342 mg/g in 08/2018 and UA with 23-30 RBC in 06/2018 and 03/2018 with TNTC RBC.  She denies ever taking any steroids or immunosuppression for her kidneys or any chemo agents. Work-up here notable for renal US with 9.9 cm kidneys bilaterally and no hydro.  CK level 90.  Urinalysis with proteinuria and hematuria.      Intake/Output Summary (Last 24 hours) at 01/10/2019 1410 Last data filed at 01/10/2019 0823 Gross per 24 hour  Intake 1346.38 ml  Output 200 ml  Net 1146.38 ml    Vitals:  Vitals:   01/09/19 2133 01/10/19 0531 01/10/19 0820 01/10/19 1318  BP: (!) 153/52 (!) 188/78 (!) 188/77 (!) 173/68  Pulse: 73 73 73 72  Resp: 18 18  16   Temp: 98.7 F (37.1 C)  98.4 F (36.9 C) 98.4 F (36.9 C) 98.2 F (36.8 C)  TempSrc: Oral Oral Oral Oral  SpO2: 96% 99% 98% 94%  Weight:         Physical Exam:    Medications reviewed   Labs:  BMP Latest Ref Rng & Units 01/10/2019 01/09/2019 01/08/2019  Glucose 70 - 99 mg/dL 85 92 81  BUN 8 - 23 mg/dL 68(H) 74(H) 79(H)  Creatinine 0.44 - 1.00 mg/dL 6.21(H) 6.51(H) 6.34(H)  Sodium 135 - 145 mmol/L 139 140 141  Potassium 3.5 - 5.1 mmol/L 4.5 4.8 4.5  Chloride 98 - 111 mmol/L 114(H) 113(H) 114(H)  CO2 22 - 32 mmol/L 15(L) 19(L) 19(L)  Calcium 8.9 - 10.3 mg/dL 6.9(L) 6.9(L) 7.3(L)     Assessment/Plan:   # AKI  - Pre-renal insults in the setting of nausea, diarrhea; lisinopril.  She is now  off of lisinopril and jardiance.  Possible CKD progression as below but also with potentially reversible pre-renal insults.  Post void residual was ok at 93 on 6/18. - per notes, slight nausea - on IVFs- will change to isotonic bicarb - Discontinued nifedipine to avoid relative hypotension - She may ultimately have just had CKD progression.  No acute indication for dialysis today but will monitor closely for need.  Should renal function fail to improve by 6/22 discussed that she would need to start dialysis at that time and she does agree with this plan  - Strict intake and output   # CKD stage IV  - Secondary to fibrillary GN - Previous baseline Cr 2.0- 2.3; had risen to 2.9 in 11/2018.  (GFR 20 since last fall until earlier in May).  Given GFR of 20 at baseline not a candidate for immunosuppression   # HTN with CKD  - Would avoid relative hypotension - she was as high as low 200's on admit and nadir 130's yesterday - have discontinued nifedipine for now   # Metabolic acidosis  - 2/2 CKD  - On supplemental bicarb- CO2 falling, will change to isotonic bicarb  # Anemia with CKD  - Felt secondary to relative iron deficiency and CKD  - s/p IV iron on 6/15 and followed by hem/onc - Dr. Delton Coombes  - She is on procrit as well - dosed at 20,000 units every 3 weeks (just changed from 20,000 units every 2 weeks).  Pt estimates she last received on 6/1.   - s/p aranesp 60 mcg once on 6/18 - No acute indication for transfusion    # Proteinuria, hematuria 2/2 fibrillary GN - UP/cr ratio 6060 mg/g and hematuria - Note she has proteinuria and hematuria at her baseline.  Up/cr ratio was 2342 mg/g in 08/2018 and UA with 23-30 RBC in 06/2018 & 03/2018 with TNTC RBC. - Given GFR of 20 at baseline not a candidate for immunosuppression   # Hyperphosphatemia  - 2/2 AKI  - Discussed low phos diet including stopping dark sodas - Start phoslo with meals - other bone/mineral - check intact PTH  #  Osteoporosis - Note current hypocalcemia - Would avoid prolia with her GFR as concern for exacerbating hypocalcemia; would not resume as outpatient  # Diarrhea   - Supportive care per primary team   - will follow with in-person visit tomorrow  Madelon Lips, MD 01/10/2019 2:10 PM

## 2019-01-11 LAB — GLUCOSE, CAPILLARY
Glucose-Capillary: 165 mg/dL — ABNORMAL HIGH (ref 70–99)
Glucose-Capillary: 143 mg/dL — ABNORMAL HIGH (ref 70–99)
Glucose-Capillary: 141 mg/dL — ABNORMAL HIGH (ref 70–99)

## 2019-01-11 LAB — RENAL FUNCTION PANEL
Albumin: 2 g/dL — ABNORMAL LOW (ref 3.5–5.0)
Anion gap: 12 (ref 5–15)
BUN: 70 mg/dL — ABNORMAL HIGH (ref 8–23)
CO2: 17 mmol/L — ABNORMAL LOW (ref 22–32)
Calcium: 6.8 mg/dL — ABNORMAL LOW (ref 8.9–10.3)
Chloride: 104 mmol/L (ref 98–111)
Creatinine, Ser: 6.64 mg/dL — ABNORMAL HIGH (ref 0.44–1.00)
GFR calc Af Amer: 6 mL/min — ABNORMAL LOW (ref >60)
GFR calc non Af Amer: 5 mL/min — ABNORMAL LOW (ref >60)
Glucose, Bld: 177 mg/dL — ABNORMAL HIGH (ref 70–99)
Phosphorus: 6.5 mg/dL — ABNORMAL HIGH (ref 2.5–4.6)
Potassium: 4.1 mmol/L (ref 3.5–5.1)
Sodium: 133 mmol/L — ABNORMAL LOW (ref 135–145)

## 2019-01-11 LAB — PTH, INTACT AND CALCIUM
Calcium, Total (PTH): 7.1 mg/dL — ABNORMAL LOW (ref 8.7–10.3)
PTH: 155 pg/mL — ABNORMAL HIGH (ref 15–65)

## 2019-01-11 LAB — CBC
HCT: 26.1 % — ABNORMAL LOW (ref 36.0–46.0)
Hemoglobin: 8.3 g/dL — ABNORMAL LOW (ref 12.0–15.0)
MCH: 29.4 pg (ref 26.0–34.0)
MCHC: 31.8 g/dL (ref 30.0–36.0)
MCV: 92.6 fL (ref 80.0–100.0)
Platelets: 194 10*3/uL (ref 150–400)
RBC: 2.82 MIL/uL — ABNORMAL LOW (ref 3.87–5.11)
RDW: 13.9 % (ref 11.5–15.5)
WBC: 6.2 10*3/uL (ref 4.0–10.5)
nRBC: 0 % (ref 0.0–0.2)

## 2019-01-11 MED ORDER — CLONIDINE HCL 0.1 MG PO TABS
0.1000 mg | ORAL_TABLET | Freq: Four times a day (QID) | ORAL | Status: DC | PRN
  Administered 2019-01-11: 0.1 mg via ORAL
  Filled 2019-01-11: qty 1

## 2019-01-11 MED ORDER — HYDRALAZINE HCL 20 MG/ML IJ SOLN
10.0000 mg | Freq: Once | INTRAMUSCULAR | Status: AC
  Administered 2019-01-11: 10 mg via INTRAMUSCULAR
  Filled 2019-01-11: qty 1

## 2019-01-11 MED ORDER — INSULIN ASPART 100 UNIT/ML ~~LOC~~ SOLN: 0.0000 [IU] | Freq: Every day | SUBCUTANEOUS | Status: DC

## 2019-01-11 MED ORDER — INSULIN ASPART 100 UNIT/ML ~~LOC~~ SOLN
0.0000 [IU] | Freq: Three times a day (TID) | SUBCUTANEOUS | Status: DC
  Administered 2019-01-11: 1 [IU] via SUBCUTANEOUS

## 2019-01-11 MED ORDER — SODIUM BICARBONATE 650 MG PO TABS
1300.0000 mg | ORAL_TABLET | Freq: Three times a day (TID) | ORAL | Status: DC
  Administered 2019-01-11 – 2019-01-12 (×3): 1300 mg via ORAL
  Filled 2019-01-11 (×3): qty 2

## 2019-01-11 MED ORDER — LOPERAMIDE HCL 2 MG PO CAPS
2.0000 mg | ORAL_CAPSULE | ORAL | Status: DC | PRN
  Administered 2019-01-11: 2 mg via ORAL
  Filled 2019-01-11: qty 1

## 2019-01-11 NOTE — Progress Notes (Signed)
[facility] Progress Note  Name: Emillee Talsma MRN: [id number] DOB: 1940/08/28  Chief Complaint:  Fatigue, nausea, diarrhea  Subjective:  Seen in room.  Reports fatigue and nausea are better.  Still has some diarrhea.  Ate a good lunch.  Has a "taste" in her mouth.    ---------------------- Background on consult:  Annalycia Done is a 78 y.o. female with a history of CKD stage IV followed by Dr. Al Corpus at [facility] who presented to [facility] on 6/17 with nausea, diarrhea, and fatigue/weakness which has been worsening over the past week.  She had also had outpatient labs which indicated that her creatinine had increased to 4.89 per charting and they recommended she come to the ER where her Cr was found to be 6.42.  Her creatinine was 2.91 on 11/26/18 and Cr had been 2.0 - 2.3 with a GFR around 20 since 03/2018 before that time.  Nausea is better now and she was able to eat some breakfast this morning.  Diarrhea is still an issue and has been going on for "a long time" - over a year.  She has been on immodium daily PRN for the same.  She has received gentle hydration here with current fluids at 50.  Initially had swelling over the weekend and this is actually improved.  No urine output is charted but she states that she has been "urinating normally".  She states that a rheumatologist recently diagnosed her with RA and she was given a steroid pack a week and a half ago.  No other new meds that she recalls.  No fever or rash.  No hemoptysis.  She has been short of breath for a couple of years; she would characterize her breathing as at baseline currently.  No blood per rectum or dark stools.  Last dose of Jardiance was Sunday night and last dose of lisinopril was Sunday night as well; normally on lisinopril 40 mg BID.  She normally does take lasix at home and last dose was Sunday night.  She does not have dialysis access; her provider has broached the topic of dialysis before.  She would  want dialysis if indicated but is encouraged to hear we might be able to hold off at least for the time being.  She has been in a trial for Jardiance through Dr. Joseph Berkshire at [facility] for the past several months and this has been held as well.  She is also followed by Dr. Delton Coombes in hem/onc and had recent IV iron infusion on 6/15 and states last retacrit was on 6/1.   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  Up/cr ratio 2342 mg/g in 08/2018 and UA with 23-30 RBC in 06/2018 and 03/2018 with TNTC RBC.  She denies ever taking any steroids or immunosuppression for her kidneys or any chemo agents. Work-up here notable for renal US with 9.9 cm kidneys bilaterally and no hydro.  CK level 90.  Urinalysis with proteinuria and hematuria.      Intake/Output Summary (Last 24 hours) at 01/11/2019 1442 Last data filed at 01/11/2019 0400 Gross per 24 hour  Intake 1328.3 ml  Output 1050 ml  Net 278.3 ml    Vitals:  Vitals:   01/10/19 1754 01/10/19 2252 01/11/19 0554 01/11/19 1011  BP:  (!) 157/59 (!) 151/63 (!) 166/67  Pulse:  77 72 75  Resp:  15 15   Temp:   98.4 F (36.9 C)   TempSrc:   Oral   SpO2:  97% 98%   Weight:   55.1 kg   Height: 4\' 11"  (1.499 m)        Physical Exam:  GEN pleasant, NAD HEENT EOMI PERRL NECK no JVD PULM clear bilaterally no c/w/r CV RRR soft systolic murmur ABD obese, no distention EXT 2+ LE edema NEURO AAO x 3 nonfocal SKIN no rashes/ lesions MSK no effusions  Medications reviewed   Labs:  BMP Latest Ref Rng & Units 01/11/2019 01/10/2019 01/10/2019  Glucose 70 - 99 mg/dL 177(H) 85 -  BUN 8 - 23 mg/dL 70(H) 68(H) -  Creatinine 0.44 - 1.00 mg/dL 6.64(H) 6.21(H) -  Sodium 135 - 145 mmol/L 133(L) 139 -  Potassium 3.5 - 5.1 mmol/L 4.1 4.5 -  Chloride 98 - 111  mmol/L 104 114(H) -  CO2 22 - 32 mmol/L 17(L) 15(L) -  Calcium 8.9 - 10.3 mg/dL 6.8(L) 6.9(L) 7.1(L)     Assessment/Plan:   # AKI  - Pre-renal insults in the setting of nausea, diarrhea; lisinopril.  She is now off of lisinopril and jardiance.  Possible CKD progression as below but also with potentially reversible pre-renal insults.  Post void residual was ok at 93 on 6/18. - nausea improved - I discussed with pt today- we'll stop IVFs and if no better/ worsening off IVFs will proceed with initiation of HD, may require transfer [facility] if tunneled HD cath unavailable here at [facility]. - Discontinued nifedipine to avoid relative hypotension - She may ultimately have just had CKD progression.  No acute indication for dialysis today but will monitor closely for need.  Should renal function fail to improve by 6/22 discussed with Dr. Royce Macadamia that she would likely need to start dialysis at that time and she does agree with this plan  - Strict intake and output   # CKD stage IV  - Secondary to fibrillary GN - Previous baseline Cr 2.0- 2.3; had risen to 2.9 in 11/2018.  (GFR 20 since last fall until earlier in May).  Given GFR of 20 at baseline not a candidate for immunosuppression   # HTN with CKD  - Would avoid relative hypotension - she was as high as low 200's on admit and nadir 130's yesterday - have discontinued nifedipine for now   # Metabolic acidosis  - 2/2 CKD  - On supplemental bicarb- CO2 a little improved today- increase to 1300 TID  # Anemia with CKD  - Felt secondary to relative iron deficiency and CKD  - s/p IV iron on 6/15 and followed by hem/onc - Dr. Delton Coombes  - She is on procrit as well - dosed at 20,000 units every 3 weeks (just changed from 20,000 units every 2 weeks).  Pt estimates she last received on 6/1.   - s/p aranesp 60 mcg once on 6/18 - No acute indication for transfusion    # Proteinuria, hematuria 2/2 fibrillary GN - UP/cr ratio 6060 mg/g and hematuria -  Note she has proteinuria and hematuria at her baseline.  Up/cr ratio was 2342 mg/g in 08/2018 and UA with 23-30 RBC in 06/2018 & 03/2018 with TNTC RBC. - Given GFR of 20 at baseline not a candidate for immunosuppression   # Hyperphosphatemia  - 2/2 AKI  - Discussed low phos diet including stopping dark sodas - Start phoslo with meals - other bone/mineral -  check intact PTH  # Osteoporosis - Note current hypocalcemia - Would avoid prolia with her GFR as concern for exacerbating hypocalcemia; would not resume as outpatient  # Diarrhea   - Supportive care per primary team    Madelon Lips, MD 01/11/2019 2:42 PM

## 2019-01-11 NOTE — Progress Notes (Signed)
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 Cristi Loron who is also on a clinical trial with Jardiance to see if it will improve her renal function even though she does not have diabetes. The clinical trial is administered by Dr. Joseph Berkshire also at [facility] and study nurse is [place] phone [phone].  Patient was in her usual state of health until about a week ago when she started noticing worse than usual fatigue associated with some anorexia and some nausea. She has had no vomiting. She called the study nurse who asked her to get her kidney function checked. She was called today and was told that her creatinine had increased from 2.9-5. She was asked to come to the emergency room for evaluation.  At present patient admits to ongoing anorexia and nausea. No vomiting. She also has fatigue if she has had for several months although worse over the past week. Patient has a mild headache. No fevers or chills. No abdominal pain. No hematuria. Patient is continuing to urinate,but has some worsening nausea today as well as elevated blood pressures overnight.  She was started on prednisone on 6/20 for RA flare to her upper extremities.   Assessment & Plan:   Principal Problem:   ARF (acute renal failure) (HCC) Active Problems:   Essential hypertension, benign   Glomerulonephritis   Tachycardia, unspecified   AKI on CKD stage IV (prior baseline creatinine 2-2.3) -Likely prerenal in the setting of nausea and lisinopril use as well as potential Jardiance use -Appreciate nephrology input with possible need for dialysis soon -Continue monitor a.m. renal profile as well as strict I's and O's and daily weights -Appreciate further  nephrology recommendations today -Maintain on IV fluid with bicarbonate added on 6/20.  Patient also on oral bicarbonate.  Slight improvement noted in levels on repeat lab work.  N/V/D -  nausea and vomiting have improved today and patient tolerating breakfast.  Anemia secondary to CKD and iron deficiency-currently stable -Aranesp per nephrology -Monitor repeat CBC with no overt bleeding identified   Hypertension-stable -Continue current medications with adequate control noted -IV medications ordered as needed with elevations noted overnight -Nifedipine discontinued by nephrology 6/19  Suspected RA flare -ESR and CRP elevated -Started prednisone 30 mg at 0.5 mg/kilogram dosing on 6/20 with improvement -SSI added to help with steroid-induced hyperglycemia   DVT prophylaxis:Heparin Code Status:Full Family Communication:None at bedside Disposition Plan:Continue IV fluid and treat AKIwith monitoring over the weekend and potential need for dialysis should kidney function not improve.  Prednisone started for RA flare.   Consultants:  Nephrology  Procedures:  None  Antimicrobials:   None   Subjective: Patient seen and evaluated today with no new acute complaints or concerns. No acute concerns or events noted overnight.  She denies any further nausea and overall seems to be feeling somewhat better.  Her upper extremities are less swollen and she has less pain noted this morning.  Objective: Vitals:   01/10/19 1754 01/10/19 2252 01/11/19 0554 01/11/19 1011  BP:  (!) 157/59 (!) 151/63 (!) 166/67  Pulse:  77 72 75  Resp:  15 15   Temp:   98.4 F (36.9 C)   TempSrc:   Oral   SpO2:  97% 98%   Weight:   55.1 kg  Height: 4' 11" (1.499 m)       Intake/Output Summary (Last 24 hours) at 01/11/2019 1127 Last data filed at 01/11/2019 0400 Gross per 24 hour  Intake 1568.3 ml  Output 1050 ml  Net 518.3 ml   Filed Weights   01/07/19 1948 01/11/19 0554   Weight: 55.1 kg 55.1 kg    Examination:  General exam: Appears calm and comfortable  Respiratory system: Clear to auscultation. Respiratory effort normal. Cardiovascular system: S1 & S2 heard, RRR. No JVD, murmurs, rubs, gallops or clicks. No pedal edema. Gastrointestinal system: Abdomen is nondistended, soft and nontender. No organomegaly or masses felt. Normal bowel sounds heard. Central nervous system: Alert and oriented. No focal neurological deficits. Extremities: Symmetric 5 x 5 power. Skin: No rashes, lesions or ulcers Psychiatry: Judgement and insight appear normal. Mood & affect appropriate.     Data Reviewed: I have personally reviewed following labs and imaging studies  CBC: Recent Labs  Lab 01/07/19 1342 01/08/19 0546 01/09/19 0635 01/10/19 0632 01/11/19 0619  WBC 5.4 5.0 6.3 5.7 6.2  NEUTROABS 3.9  --   --   --   --   HGB 9.7* 8.7* 8.7* 8.8* 8.3*  HCT 30.6* 28.3* 27.9* 28.3* 26.1*  MCV 92.4 93.7 95.5 95.0 92.6  PLT 196 186 182 177 366   Basic Metabolic Panel: Recent Labs  Lab 01/07/19 1342 01/08/19 0546 01/09/19 0635 01/10/19 0632 01/11/19 0619  NA 137 141 140 139 133*  K 4.7 4.5 4.8 4.5 4.1  CL 107 114* 113* 114* 104  CO2 16* 19* 19* 15* 17*  GLUCOSE 83 81 92 85 177*  BUN 80* 79* 74* 68* 70*  CREATININE 6.42* 6.34* 6.51* 6.21* 6.64*  CALCIUM 7.6* 7.3* 6.9* 6.9* 6.8*  PHOS  --   --  8.3* 7.0* 6.5*   GFR: Estimated Creatinine Clearance: 5.3 mL/min (A) (by C-G formula based on SCr of 6.64 mg/dL (H)). Liver Function Tests: Recent Labs  Lab 01/07/19 1342 01/08/19 0546 01/09/19 0635 01/10/19 0632 01/11/19 0619  AST 22 19  --   --   --   ALT 16 14  --   --   --   ALKPHOS 56 47  --   --   --   BILITOT 0.6 0.7  --   --   --   PROT 5.7* 4.8*  --   --   --   ALBUMIN 2.4* 2.1* 2.1* 2.1* 2.0*   No results for input(s): LIPASE, AMYLASE in the last 168 hours. No results for input(s): AMMONIA in the last 168 hours. Coagulation Profile: No results  for input(s): INR, PROTIME in the last 168 hours. Cardiac Enzymes: Recent Labs  Lab 01/07/19 1546  CKTOTAL 90   BNP (last 3 results) No results for input(s): PROBNP in the last 8760 hours. HbA1C: No results for input(s): HGBA1C in the last 72 hours. CBG: No results for input(s): GLUCAP in the last 168 hours. Lipid Profile: No results for input(s): CHOL, HDL, LDLCALC, TRIG, CHOLHDL, LDLDIRECT in the last 72 hours. Thyroid Function Tests: No results for input(s): TSH, T4TOTAL, FREET4, T3FREE, THYROIDAB in the last 72 hours. Anemia Panel: No results for input(s): VITAMINB12, FOLATE, FERRITIN, TIBC, IRON, RETICCTPCT in the last 72 hours. Sepsis Labs: No results for input(s): PROCALCITON, LATICACIDVEN in the last 168 hours.  Recent Results (from the past 240 hour(s))  Novel Coronavirus,NAA,(SEND-OUT TO REF LAB - TAT 24-48 hrs); Hosp Order     Status: None   Collection Time: 01/07/19  5:46 PM   Specimen: Nasopharyngeal Swab; Respiratory  Result Value Ref Range Status   SARS-CoV-2, NAA NOT DETECTED NOT DETECTED Final    Comment: (NOTE) This test was developed and its performance characteristics determined by [employer]. This test has not been FDA cleared or approved. This test has been authorized by FDA under an Emergency Use Authorization (EUA). This test is only authorized for the duration of time the declaration that circumstances exist justifying the authorization of the emergency use of in vitro diagnostic tests for detection of SARS-CoV-2 virus and/or diagnosis of COVID-19 infection under section 564(b)(1) of the Act, 21 U.S.C. 448JEH-6(D)(1), unless the authorization is terminated or revoked sooner. When diagnostic testing is negative, the possibility of a false negative result should be considered in the context of a patient's recent exposures and the presence of clinical signs and symptoms consistent with COVID-19. An individual without symptoms of COVID-19 and  who is not shedding SARS-CoV-2 virus would expect to have a negative (not detected) result in this assay. Performed  At: [facility] [place], [place] [id number] Rush Farmer MD HY:[id number]    [place]  Final    Comment: Performed at [facility], [address] [place], [place] [address]         Radiology Studies: No results found.      Scheduled Meds: . calcium acetate  667 mg Oral TID WC  . carvedilol  12.5 mg Oral BID WC  . heparin  5,000 Units Subcutaneous Q8H  . hydrALAZINE  50 mg Oral BID  . insulin aspart  0-5 Units Subcutaneous QHS  . insulin aspart  0-9 Units Subcutaneous TID WC  . lidocaine  1 patch Transdermal Q24H  . pantoprazole  40 mg Oral Daily  . [START ON 01/12/2019] pneumococcal 23 valent vaccine  0.5 mL Intramuscular Tomorrow-1000  . predniSONE  30 mg Oral Q breakfast  . sodium bicarbonate  1,300 mg Oral BID   Continuous Infusions: .  sodium bicarbonate  infusion 1000 mL 75 mL/hr at 01/11/19 0654     LOS: 4 days    Time spent: 30 minutes    Pratik Darleen Crocker, DO [facility] Pager [phone]  If 7PM-7AM, please contact night-coverage [email or web address] Password [facility] 01/11/2019, 11:27 AM

## 2019-01-11 NOTE — Progress Notes (Signed)
Notified dr Maudie Mercury of pt elevated BP. Unable to obtain IV access at this time. New orders received. Will follow and re-evaluate.

## 2019-01-12 LAB — RENAL FUNCTION PANEL
Albumin: 2.1 g/dL — ABNORMAL LOW (ref 3.5–5.0)
Anion gap: 11 (ref 5–15)
BUN: 79 mg/dL — ABNORMAL HIGH (ref 8–23)
CO2: 23 mmol/L (ref 22–32)
Calcium: 7.1 mg/dL — ABNORMAL LOW (ref 8.9–10.3)
Chloride: 106 mmol/L (ref 98–111)
Creatinine, Ser: 7.1 mg/dL — ABNORMAL HIGH (ref 0.44–1.00)
GFR calc Af Amer: 6 mL/min — ABNORMAL LOW (ref >60)
GFR calc non Af Amer: 5 mL/min — ABNORMAL LOW (ref >60)
Glucose, Bld: 110 mg/dL — ABNORMAL HIGH (ref 70–99)
Phosphorus: 6 mg/dL — ABNORMAL HIGH (ref 2.5–4.6)
Potassium: 4.3 mmol/L (ref 3.5–5.1)
Sodium: 138 mmol/L (ref 135–145)

## 2019-01-12 LAB — CBC
HCT: 26.3 % — ABNORMAL LOW (ref 36.0–46.0)
Hemoglobin: 8.3 g/dL — ABNORMAL LOW (ref 12.0–15.0)
MCH: 29.5 pg (ref 26.0–34.0)
MCHC: 31.6 g/dL (ref 30.0–36.0)
MCV: 93.6 fL (ref 80.0–100.0)
Platelets: 216 10*3/uL (ref 150–400)
RBC: 2.81 MIL/uL — ABNORMAL LOW (ref 3.87–5.11)
RDW: 13.9 % (ref 11.5–15.5)
WBC: 10 10*3/uL (ref 4.0–10.5)
nRBC: 0 % (ref 0.0–0.2)

## 2019-01-12 LAB — GLUCOSE, CAPILLARY: Glucose-Capillary: 92 mg/dL (ref 70–99)

## 2019-01-12 LAB — HEMOGLOBIN A1C
Hgb A1c MFr Bld: 5.4 % (ref 4.8–5.6)
Mean Plasma Glucose: 108 mg/dL

## 2019-01-12 MED ORDER — CALCITRIOL 0.25 MCG PO CAPS: 0.2500 ug | ORAL_CAPSULE | Freq: Every day | ORAL | 3 refills | Status: AC

## 2019-01-12 MED ORDER — CALCITRIOL 0.25 MCG PO CAPS
0.2500 ug | ORAL_CAPSULE | Freq: Every day | ORAL | Status: DC
  Administered 2019-01-12: 0.25 ug via ORAL
  Filled 2019-01-12: qty 1

## 2019-01-12 MED ORDER — FUROSEMIDE 80 MG PO TABS: 80.0000 mg | ORAL_TABLET | Freq: Two times a day (BID) | ORAL | 3 refills | Status: DC

## 2019-01-12 MED ORDER — LOPERAMIDE HCL 2 MG PO CAPS: 2.0000 mg | ORAL_CAPSULE | ORAL | 0 refills | Status: DC | PRN

## 2019-01-12 MED ORDER — FUROSEMIDE 80 MG PO TABS
80.0000 mg | ORAL_TABLET | Freq: Two times a day (BID) | ORAL | Status: DC
  Administered 2019-01-12: 80 mg via ORAL
  Filled 2019-01-12: qty 1

## 2019-01-12 MED ORDER — PREDNISONE 10 MG PO TABS: ORAL_TABLET | ORAL | 0 refills | Status: AC

## 2019-01-12 MED ORDER — SODIUM BICARBONATE 650 MG PO TABS: 1300.0000 mg | ORAL_TABLET | Freq: Three times a day (TID) | ORAL | 3 refills | Status: DC

## 2019-01-12 MED ORDER — CALCIUM ACETATE (PHOS BINDER) 667 MG PO CAPS: 667.0000 mg | ORAL_CAPSULE | Freq: Three times a day (TID) | ORAL | 3 refills | Status: AC

## 2019-01-12 MED ORDER — ONDANSETRON 4 MG PO TBDP: 4.0000 mg | ORAL_TABLET | Freq: Three times a day (TID) | ORAL | 0 refills | Status: DC | PRN

## 2019-01-12 NOTE — Discharge Instructions (Signed)
Heart Attack A heart attack (myocardial infarction, MI) is when blood and oxygen supply to the heart is cut off. A heart attack causes damage to the heart that cannot be fixed. If you think you are having a heart attack, do not wait to see if the symptoms will go away. Get medical help right away. What are the signs or symptoms? Symptoms of this condition include:  Chest pain. It may feel like: ? Crushing or squeezing. ? Tightness, pressure, fullness, or heaviness.  Pain in the arm, neck, jaw, back, or upper body.  Shortness of breath.  Heartburn.  Indigestion.  Nausea.  Cold sweats.  Feeling tired.  Sudden lightheadedness. Follow these instructions at home: Medicines  Take over-the-counter and prescription medicines only as told by your doctor. You may need to take medicine: ? To keep your blood from clotting too easily. ? To control blood pressure. ? To lower cholesterol. ? To control heart rhythms.  Do not take these medicines unless your doctor says it is okay: ? NSAIDs. ? Supplements that have vitamin A, vitamin E, or both. ? Hormone replacement therapy that has estrogen with or without progestin. Lifestyle   Do not use any products that have nicotine or tobacco. This includes cigarettes and e-cigarettes. If you need help quitting, ask your doctor.  Avoid secondhand smoke.  Exercise regularly. Ask your doctor about a cardiac rehab program.  Eat heart-healthy foods. A diet and nutrition specialist (registered dietitian) can help you form healthy eating habits.  Stay at a healthy weight.  Lower your stress level.  Do not use illegal drugs. Alcohol use  Do not drink alcohol if: ? Your doctor tells you not to drink. ? You are pregnant, may be pregnant, or are planning to become pregnant.  If you drink alcohol, limit how much you have: ? 0-1 drink a day for women. ? 0-2 drinks a day for men.  Know how much alcohol is in your drink. In the [place] one drink  equals one typical bottle of beer (12 oz), one-half glass of wine (5 oz), or one shot of hard liquor (1 oz). General instructions  Work with your doctor to treat other problems you have, like diabetes.  Get screened for depression, and get treatment if needed.  Keep your vaccinations up to date. Get the flu shot (influenza vaccination) every year.  Keep all follow-up visits as told by your doctor. This is important. Contact a doctor if:  You feel sad.  You have trouble doing your daily activities. Get help right away if you:  Have sudden, unexplained discomfort in your: ? Chest. ? Arms. ? Back. ? Neck. ? Jaw. ? Upper body.  Have shortness of breath.  Have sudden sweating or clammy skin.  Feel sick to your stomach (nauseous).  Throw up (vomit).  Suddenly get lightheaded or dizzy.  Feel your heart beating fast.  Feel your heart skipping beats.  Have blood pressure that is higher than 180/120. These symptoms may be an emergency. Do not wait to see if the symptoms will go away. Get medical help right away. Call your local emergency services (911 in the [place] Do not drive yourself to the hospital. Summary  A heart attack is when blood and oxygen supply to the heart is cut off.  You should not take NSAIDs unless your doctor says it is okay.  Do not smoke. Avoid secondhand smoke.  Exercise regularly. Ask your doctor about a cardiac rehab program. This information is not intended  to replace advice given to you by your health care provider. Make sure you discuss any questions you have with your health care provider. Document Released: 01/08/2012 Document Revised: 04/02/2018 Document Reviewed: 08/23/2017 Elsevier Interactive Patient Education  2019 [facility] Dialysis is a treatment that cleans your blood. It is used when your kidneys are damaged. When you need dialysis, you should watch what you eat. This is because some nutrients can  build up in your blood between treatments and make you sick. Your doctor or diet specialist (dietitian) will:  Tell you what nutrients you should include or avoid.  Tell you how much of these nutrients you should get each day.  Help you plan meals.  Tell you how much to drink each day. What are tips for following this plan? Reading food labels  Check food labels for: ? Potassium. This is found in milk, fruits, and vegetables. ? Phosphorus. This is found in milk, cheese, beans, nuts, and carbonated beverages. ? Salt (sodium). This is in processed meats, cured meats, ready-made frozen meals, canned vegetables, and salty snack foods.  Try to find foods that are low in potassium, phosphorus, and sodium.  Look for foods that are labeled "sodium free," "reduced sodium," or "low sodium." Shopping  Do not buy whole-grain and high-fiber foods.  Do not buy or use salt substitutes.  Do not buy processed foods. Cooking  Drain all fluid from cooked vegetables and canned fruits before you eat them.  Before you cook potatoes, cut them into small pieces. Then boil them in unsalted water.  Try using herbs and spices that do not contain sodium to add flavor. Meal planning Most people on dialysis should try to eat:  6-11 servings of grains each day. One serving is equal to 1 slice of bread or  cup of cooked rice or pasta.  2-3 servings of low-potassium vegetables each day. One serving is equal to  cup.  2-3 servings of low-potassium fruits each day. One serving is equal to  cup.  Protein, such as meat, poultry, fish, and eggs. Talk with your doctor or dietitian about the right amount and type of protein to eat.   cup of dairy each day. General information  Follow your doctor's instructions about how much to drink. You may be told to: ? Write down what you drink. ? Write down the foods you eat that are made mostly from water, such as gelatin and soups. ? Drink from small  cups.  Take vitamin and mineral supplements only as told by your doctor.  Take over-the-counter and prescription medicines only as told by your doctor. What foods can I eat?     Fruits Apples. Fresh or frozen berries. Fresh or canned pears, peaches, and pineapple. Grapes. Plums. Vegetables Fresh or frozen broccoli, carrots, and green beans. Cabbage. Cauliflower. Celery. Cucumbers. Eggplant. Radishes. Zucchini. Grains White bread. White rice. Cooked cereal. Unsalted popcorn. Tortillas. Pasta. Meats and other proteins Fresh or frozen beef, pork, chicken, and fish. Eggs. Dairy Cream cheese. Heavy cream. Ricotta cheese. Beverages Apple cider. Cranberry juice. Grape juice. Lemonade. Black coffee. Rice milk (that is not enriched or fortified). Seasonings and condiments Herbs. Spices. Jam and jelly. Honey. Sweets and desserts Sherbet. Cakes. Cookies. Fats and oils Olive oil, canola oil, and safflower oil. Other foods Non-dairy creamer. Non-dairy whipped topping. Homemade broth without salt. The items listed above may not be a complete list of foods and beverages you can eat. Contact your dietitian for  more options. What foods should I avoid? Fruits Star fruit. Bananas. Oranges. Kiwi. Nectarines. Prunes. Melon. Dried fruit. Avocado. Vegetables Potatoes. Beets. Tomatoes. Winter squash and pumpkin. Asparagus. Spinach. Parsnips. Grains Whole-grain bread. Whole-grain pasta. High-fiber cereal. Meats and other proteins Canned, smoked, and cured meats. Packaged lunch meat. Sardines. Nuts and seeds. Peanut butter. Beans and legumes. Dairy Milk. Buttermilk. Yogurt. Cheese and cottage cheese. Processed cheese spreads. Beverages Orange juice. Prune juice. Carbonated soft drinks. Seasonings and condiments Salt. Salt substitutes. Soy sauce. Sweets and desserts Ice cream. Chocolate. Candied nuts. Fats and oils Butter. Margarine. Other foods Ready-made frozen meals. Canned soups. The  items listed above may not be a complete list of foods and beverages you should avoid. Contact your dietitian for more information. Summary  If you are having dialysis, it is important to watch what you eat. Certain nutrients and wastes can build up in your blood and cause you to get sick.  Your dietitian will help you make an eating plan that meets your needs.  Avoid foods that are high in potassium, salt (sodium), and phosphorus. Restrict fluids as told by your doctor or dietitian. This information is not intended to replace advice given to you by your health care provider. Make sure you discuss any questions you have with your health care provider. Document Released: 01/08/2012 Document Revised: 09/25/2017 Document Reviewed: 07/10/2017 Elsevier Interactive Patient Education  2019 [place] DASH stands for "Dietary Approaches to Stop Hypertension." The DASH eating plan is a healthy eating plan that has been shown to reduce high blood pressure (hypertension). It may also reduce your risk for type 2 diabetes, heart disease, and stroke. The DASH eating plan may also help with weight loss. What are tips for following this plan?  General guidelines  Avoid eating more than 2,300 mg (milligrams) of salt (sodium) a day. If you have hypertension, you may need to reduce your sodium intake to 1,500 mg a day.  Limit alcohol intake to no more than 1 drink a day for nonpregnant women and 2 drinks a day for men. One drink equals 12 oz of beer, 5 oz of wine, or 1 oz of hard liquor.  Work with your health care provider to maintain a healthy body weight or to lose weight. Ask what an ideal weight is for you.  Get at least 30 minutes of exercise that causes your heart to beat faster (aerobic exercise) most days of the week. Activities may include walking, swimming, or biking.  Work with your health care provider or diet and nutrition specialist (dietitian) to adjust your eating  plan to your individual calorie needs. Reading food labels   Check food labels for the amount of sodium per serving. Choose foods with less than 5 percent of the Daily Value of sodium. Generally, foods with less than 300 mg of sodium per serving fit into this eating plan.  To find whole grains, look for the word "whole" as the first word in the ingredient list. Shopping  Buy products labeled as "low-sodium" or "no salt added."  Buy fresh foods. Avoid canned foods and premade or frozen meals. Cooking  Avoid adding salt when cooking. Use salt-free seasonings or herbs instead of table salt or sea salt. Check with your health care provider or pharmacist before using salt substitutes.  Do not fry foods. Cook foods using healthy methods such as baking, boiling, grilling, and broiling instead.  Cook with heart-healthy oils, such as olive, canola, soybean, or sunflower  oil. Meal planning  Eat a balanced diet that includes: ? 5 or more servings of fruits and vegetables each day. At each meal, try to fill half of your plate with fruits and vegetables. ? Up to 6-8 servings of whole grains each day. ? Less than 6 oz of lean meat, poultry, or fish each day. A 3-oz serving of meat is about the same size as a deck of cards. One egg equals 1 oz. ? 2 servings of low-fat dairy each day. ? A serving of nuts, seeds, or beans 5 times each week. ? Heart-healthy fats. Healthy fats called Omega-3 fatty acids are found in foods such as flaxseeds and coldwater fish, like sardines, salmon, and mackerel.  Limit how much you eat of the following: ? Canned or prepackaged foods. ? Food that is high in trans fat, such as fried foods. ? Food that is high in saturated fat, such as fatty meat. ? Sweets, desserts, sugary drinks, and other foods with added sugar. ? Full-fat dairy products.  Do not salt foods before eating.  Try to eat at least 2 vegetarian meals each week.  Eat more home-cooked food and less  restaurant, buffet, and fast food.  When eating at a restaurant, ask that your food be prepared with less salt or no salt, if possible. What foods are recommended? The items listed may not be a complete list. Talk with your dietitian about what dietary choices are best for you. Grains Whole-grain or whole-wheat bread. Whole-grain or whole-wheat pasta. Brown rice. [place]. Bulgur. Whole-grain and low-sodium cereals. Pita bread. Low-fat, low-sodium crackers. Whole-wheat flour tortillas. Vegetables Fresh or frozen vegetables (raw, steamed, roasted, or grilled). Low-sodium or reduced-sodium tomato and vegetable juice. Low-sodium or reduced-sodium tomato sauce and tomato paste. Low-sodium or reduced-sodium canned vegetables. Fruits All fresh, dried, or frozen fruit. Canned fruit in natural juice (without added sugar). Meat and other protein foods Skinless chicken or [place]. Ground chicken or [place]. Pork with fat trimmed off. Fish and seafood. Egg whites. Dried beans, peas, or lentils. Unsalted nuts, nut butters, and seeds. Unsalted canned beans. Lean cuts of beef with fat trimmed off. Low-sodium, lean deli meat. Dairy Low-fat (1%) or fat-free (skim) milk. Fat-free, low-fat, or reduced-fat cheeses. Nonfat, low-sodium ricotta or cottage cheese. Low-fat or nonfat yogurt. Low-fat, low-sodium cheese. Fats and oils Soft margarine without trans fats. Vegetable oil. Low-fat, reduced-fat, or light mayonnaise and salad dressings (reduced-sodium). Canola, safflower, olive, soybean, and sunflower oils. Avocado. Seasoning and other foods Herbs. Spices. Seasoning mixes without salt. Unsalted popcorn and pretzels. Fat-free sweets. What foods are not recommended? The items listed may not be a complete list. Talk with your dietitian about what dietary choices are best for you. Grains Baked goods made with fat, such as croissants, muffins, or some breads. Dry pasta or rice meal packs. Vegetables Creamed or  fried vegetables. Vegetables in a cheese sauce. Regular canned vegetables (not low-sodium or reduced-sodium). Regular canned tomato sauce and paste (not low-sodium or reduced-sodium). Regular tomato and vegetable juice (not low-sodium or reduced-sodium). Angie Fava. Olives. Fruits Canned fruit in a light or heavy syrup. Fried fruit. Fruit in cream or butter sauce. Meat and other protein foods Fatty cuts of meat. Ribs. Fried meat. Berniece Salines. Sausage. Bologna and other processed lunch meats. Salami. Fatback. Hotdogs. Bratwurst. Salted nuts and seeds. Canned beans with added salt. Canned or smoked fish. Whole eggs or egg yolks. Chicken or [place] with skin. Dairy Whole or 2% milk, cream, and half-and-half. Whole or full-fat cream cheese.  Whole-fat or sweetened yogurt. Full-fat cheese. Nondairy creamers. Whipped toppings. Processed cheese and cheese spreads. Fats and oils Butter. Stick margarine. Lard. Shortening. Ghee. Bacon fat. Tropical oils, such as coconut, palm kernel, or palm oil. Seasoning and other foods Salted popcorn and pretzels. Onion salt, garlic salt, seasoned salt, table salt, and sea salt. Worcestershire sauce. Tartar sauce. Barbecue sauce. Teriyaki sauce. Soy sauce, including reduced-sodium. Steak sauce. Canned and packaged gravies. Fish sauce. Oyster sauce. Cocktail sauce. Horseradish that you find on the shelf. Ketchup. Mustard. Meat flavorings and tenderizers. Bouillon cubes. Hot sauce and Tabasco sauce. Premade or packaged marinades. Premade or packaged taco seasonings. Relishes. Regular salad dressings. Where to find more information:  National Heart, Lung, and [place]: https://wilson-eaton.com/  American Heart Association: www.heart.org Summary  The DASH eating plan is a healthy eating plan that has been shown to reduce high blood pressure (hypertension). It may also reduce your risk for type 2 diabetes, heart disease, and stroke.  With the DASH eating plan, you should limit salt  (sodium) intake to 2,300 mg a day. If you have hypertension, you may need to reduce your sodium intake to 1,500 mg a day.  When on the DASH eating plan, aim to eat more fresh fruits and vegetables, whole grains, lean proteins, low-fat dairy, and heart-healthy fats.  Work with your health care provider or diet and nutrition specialist (dietitian) to adjust your eating plan to your individual calorie needs. This information is not intended to replace advice given to you by your health care provider. Make sure you discuss any questions you have with your health care provider. Document Released: 06/28/2011 Document Revised: 07/02/2016 Document Reviewed: 07/02/2016 Elsevier Interactive Patient Education  2019 Elsevier Inc.   Acute Kidney Injury, Adult  Acute kidney injury is a sudden worsening of kidney function. The kidneys are organs that have several jobs. They filter the blood to remove waste products and extra fluid. They also maintain a healthy balance of minerals and hormones in the body, which helps control blood pressure and keep bones strong. With this condition, your kidneys do not do their jobs as well as they should. This condition ranges from mild to severe. Over time it may develop into long-lasting (chronic) kidney disease. Early detection and treatment may prevent acute kidney injury from developing into a chronic condition. What are the causes? Common causes of this condition include:  A problem with blood flow to the kidneys. This may be caused by: ? Low blood pressure (hypotension) or shock. ? Blood loss. ? Heart and blood vessel (cardiovascular) disease. ? Severe burns. ? Liver disease.  Direct damage to the kidneys. This may be caused by: ? Certain medicines. ? A kidney infection. ? Poisoning. ? Being around or in contact with toxic substances. ? A surgical wound. ? A hard, direct hit to the kidney area.  A sudden blockage of urine flow. This may be caused  by: ? Cancer. ? Kidney stones. ? An enlarged prostate in males. What are the signs or symptoms? Symptoms of this condition may not be obvious until the condition becomes severe. Symptoms of this condition can include:  Tiredness (lethargy), or difficulty staying awake.  Nausea or vomiting.  Swelling (edema) of the face, legs, ankles, or feet.  Problems with urination, such as: ? Abdominal pain, or pain along the side of your stomach (flank). ? Decreased urine production. ? Decrease in the force of urine flow.  Muscle twitches and cramps, especially in the legs.  Confusion or trouble  concentrating.  Loss of appetite.  Fever. How is this diagnosed? This condition may be diagnosed with tests, including:  Blood tests.  Urine tests.  Imaging tests.  A test in which a sample of tissue is removed from the kidneys to be examined under a microscope (kidney biopsy). How is this treated? Treatment for this condition depends on the cause and how severe the condition is. In mild cases, treatment may not be needed. The kidneys may heal on their own. In more severe cases, treatment will involve:  Treating the cause of the kidney injury. This may involve changing any medicines you are taking or adjusting your dosage.  Fluids. You may need specialized IV fluids to balance your body's needs.  Having a catheter placed to drain urine and prevent blockages.  Preventing problems from occurring. This may mean avoiding certain medicines or procedures that can cause further injury to the kidneys. In some cases treatment may also require:  A procedure to remove toxic wastes from the body (dialysis or continuous renal replacement therapy - CRRT).  Surgery. This may be done to repair a torn kidney, or to remove the blockage from the urinary system. Follow these instructions at home: Medicines  Take over-the-counter and prescription medicines only as told by your health care provider.  Do  not take any new medicines without your health care provider's approval. Many medicines can worsen your kidney damage.  Do not take any vitamin and mineral supplements without your health care provider's approval. Many nutritional supplements can worsen your kidney damage. Lifestyle  If your health care provider prescribed changes to your diet, follow them. You may need to decrease the amount of protein you eat.  Achieve and maintain a healthy weight. If you need help with this, ask your health care provider.  Start or continue an exercise plan. Try to exercise at least 30 minutes a day, 5 days a week.  Do not use any tobacco products, such as cigarettes, chewing tobacco, and e-cigarettes. If you need help quitting, ask your health care provider. General instructions  Keep track of your blood pressure. Report changes in your blood pressure as told by your health care provider.  Stay up to date with immunizations. Ask your health care provider which immunizations you need.  Keep all follow-up visits as told by your health care provider. This is important. Where to find more information  American Association of Kidney Patients: BombTimer.gl  National Kidney Foundation: www.kidney.[place]: https://mathis.com/  [facility]: ? www.lifeoptions.org ? www.kidneyschool.org Contact a health care provider if:  Your symptoms get worse.  You develop new symptoms. Get help right away if:  You develop symptoms of worsening kidney disease, which include: ? Headaches. ? Abnormally dark or light skin. ? Easy bruising. ? Frequent hiccups. ? Chest pain. ? Shortness of breath. ? End of menstruation in women. ? Seizures. ? Confusion or altered mental status. ? Abdominal or back pain. ? Itchiness.  You have a fever.  Your body is producing less urine.  You have pain or bleeding when you urinate. Summary  Acute kidney injury is a sudden worsening  of kidney function.  Acute kidney injury can be caused by problems with blood flow to the kidneys, direct damage to the kidneys, and sudden blockage of urine flow.  Symptoms of this condition may not be obvious until it becomes severe. Symptoms may include edema, lethargy, confusion, nausea or vomiting, and problems passing urine.  This condition can usually  be diagnosed with blood tests, urine tests, and imaging tests. Sometimes a kidney biopsy is done to diagnose this condition.  Treatment for this condition often involves treating the underlying cause. It is treated with fluids, medicines, dialysis, diet changes, or surgery. This information is not intended to replace advice given to you by your health care provider. Make sure you discuss any questions you have with your health care provider. Document Released: 01/22/2011 Document Revised: 11/08/2016 Document Reviewed: 06/29/2016 Elsevier Interactive Patient Education  2019 [employer].

## 2019-01-12 NOTE — Progress Notes (Signed)
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  Objective: Vital signs in last 24 hours: Temp:  [98.3 F (36.8 C)-99.1 F (37.3 C)] 98.3 F (36.8 C) (06/22 0539) Pulse Rate:  [66-75] 69 (06/22 0539) Resp:  [16] 16 (06/22 0539) BP: (163-190)/(57-81) 163/58 (06/22 0539) SpO2:  [96 %-97 %] 96 % (06/22 0539) Weight change:   Intake/Output from previous day: 06/21 0701 - 06/22 0700 In: 930 [P.O.:930] Out: 1775 [Urine:1775] Intake/Output this shift: No intake/output data recorded.  General appearance: alert, cooperative, no distress and mildly obese Resp: clear to auscultation bilaterally Cardio: S1, S2 normal and systolic murmur: systolic ejection 2/6, crescendo and decrescendo at 2nd left intercostal space GI: soft, pos bs, obese, nontender. Extremities: edema 2+  Lab Results: Recent Labs    01/11/19 0619 01/12/19 0503  WBC 6.2 10.0  HGB 8.3* 8.3*  HCT 26.1* 26.3*  PLT 194 216   BMET:  Recent Labs    01/11/19 0619 01/12/19 0503  NA 133* 138  K 4.1 4.3  CL 104 106  CO2 17* 23  GLUCOSE 177* 110*  BUN 70* 79*  CREATININE 6.64* 7.10*  CALCIUM 6.8* 7.1*   Recent Labs    01/10/19 0632  PTH 155*  Comment   Iron Studies: No results for input(s): IRON, TIBC, TRANSFERRIN, FERRITIN in the last 72 hours.  Studies/Results: No results found.  I have reviewed the patient's current medications.  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  Can watch as outpatient with close F/u.  Acid base better 2 Anemia on esa ,Fe ok 3 HPTH 4 DM P can get Labs on Wed and Fri this week, and F/U with Dr. Hollie Salk Next week.  Add Lasix to regimen, cont bicarb.  No Jardiance, Lisinopril, or Prolia    LOS: 5 days   Jeneen Rinks [place] Student 01/12/2019,7:34 AM

## 2019-01-12 NOTE — Discharge Summary (Signed)
Physician Discharge Summary  Tenzin Pavon ZYS:[id number] DOB: 09/28/1940 DOA: 01/07/2019  PCP: Octavio Graves, DO  Admit date: 01/07/2019  Discharge date: 01/12/2019  Admitted From:Home  Disposition:  Home  Recommendations for Outpatient Follow-up:  1. Follow up with PCP in 1-2 weeks 2. Follow-up with nephrology Dr. Hollie Salk in 1 week-this will be scheduled along with follow-up outpatient labs 3. Continue on prednisone taper for RA flare as prescribed and follow-up with rheumatology in [place] in the next 3 to 4 weeks 4. Continue on Lasix and sodium bicarbonate as prescribed 5. Use Zofran and Imodium as needed for symptom control of nausea and diarrhea respectively 6. 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 Cristi Loron who is also on a clinical trial with Jardiance to see if it will improve her renal function even though she does not have diabetes. The clinical trial is administered by Dr. Joseph Berkshire also at [facility] and study nurse is [place] phone [phone].  Patient was in her usual state of health until about a week ago when she started noticing worse than usual fatigue associated with some anorexia and some nausea. She has had no vomiting. She called the study nurse who asked her to get her kidney function checked. She was called today and was told that her creatinine had increased from 2.9-5. She was asked to come to the emergency room for evaluation.  Patient was admitted with AKI on CKD stage IV with baseline previous creatinine of 2-2.3.  She was placed on IV fluid as well as sodium bicarbonate with assistance from nephrology, but did not  appear to improve in the typical fashion, therefore suggesting progression of CKD.  She was symptomatic with anorexia and nausea as well as some vomiting and fatigue.  She did not require any hemodialysis initiation while inpatient and will be followed closely in the outpatient setting with nephrology and repeat labs for suspected dialysis initiation soon.  Patient did also have RA flare of her upper extremities while here and was started on oral prednisone which will be prescribed with taper.  She will have close follow-up arranged with nephrology in the next 1 week as well as outpatient labs.  She has had no other acute events during the course of this admission and will otherwise be discharged on her usual home medications with the exception of lisinopril, Jardiance, and Prolia.  Discharge Diagnoses:  Principal Problem:   ARF (acute renal failure) (HCC) Active Problems:   Essential hypertension, benign   Glomerulonephritis   Tachycardia, unspecified  Principal discharge diagnosis: AKI with CKD stage IV, but with likely progression of CKD.  Discharge Instructions  Discharge Instructions    Diet - low sodium heart healthy   Complete by: As directed    Increase activity slowly   Complete by: As directed      Allergies as of 01/12/2019      Reactions   Sulfa Antibiotics Other (See Comments)   Shut pt's kidneys down      Medication List    STOP taking these medications   denosumab 60 MG/ML Sosy injection Commonly known as: PROLIA   Jardiance 10 MG Tabs tablet Generic drug: empagliflozin   lisinopril 20 MG tablet Commonly known as: ZESTRIL   NIFEdipine  30 MG 24 hr tablet Commonly known as: ADALAT CC     TAKE these medications   calcitRIOL 0.25 MCG capsule Commonly known as: ROCALTROL Take 1 capsule (0.25 mcg total) by mouth daily for 30 days. Start taking on: January 13, 2019   calcium acetate 667 MG capsule Commonly known as: PHOSLO Take 1 capsule (667 mg total) by  mouth 3 (three) times daily with meals for 30 days.   carvedilol 12.5 MG tablet Commonly known as: COREG Take 12.5 mg by mouth 2 (two) times daily with a meal.   epoetin alfa 20000 UNIT/ML injection Commonly known as: EPOGEN Inject 20,000 Units into the skin every 14 (fourteen) days.   furosemide 80 MG tablet Commonly known as: LASIX Take 1 tablet (80 mg total) by mouth 2 (two) times daily for 30 days. What changed:   medication strength  how much to take  when to take this   hydrALAZINE 50 MG tablet Commonly known as: APRESOLINE Take 50 mg by mouth 2 (two) times a day.   loperamide 2 MG capsule Commonly known as: IMODIUM Take 1 capsule (2 mg total) by mouth as needed for diarrhea or loose stools.   ondansetron 4 MG disintegrating tablet Commonly known as: ZOFRAN-ODT Take 1 tablet (4 mg total) by mouth every 8 (eight) hours as needed for nausea or vomiting.   pantoprazole 40 MG tablet Commonly known as: PROTONIX Take 40 mg by mouth daily.   predniSONE 10 MG tablet Commonly known as: DELTASONE Take 3 tablets (30 mg total) by mouth daily with breakfast for 5 days, THEN 2.5 tablets (25 mg total) daily with breakfast for 5 days, THEN 2 tablets (20 mg total) daily with breakfast for 5 days, THEN 1.5 tablets (15 mg total) daily with breakfast for 5 days, THEN 1 tablet (10 mg total) daily with breakfast for 5 days, THEN 0.5 tablets (5 mg total) daily with breakfast for 5 days. Start taking on: January 13, 2019   sodium bicarbonate 650 MG tablet Take 2 tablets (1,300 mg total) by mouth 3 (three) times daily for 30 days. What changed:   how much to take  when to take this   Vitamin D (Cholecalciferol) 50 MCG (2000 UT) Caps Take 2,000 Units by mouth daily.      Follow-up Information    Octavio Graves, DO Follow up in 1 week(s).   Contact information: [address] [place] [address] [phone]        Madelon Lips, MD Follow up in 1 week(s).   Specialty:  Nephrology Contact information: [address] [place] [address] [phone]          Allergies  Allergen Reactions  . Sulfa Antibiotics Other (See Comments)    Shut pt's kidneys down    Consultations:  Nephrology   Procedures/Studies: US Renal  Result Date: 01/07/2019 CLINICAL DATA:  Acute renal failure EXAM: RENAL / URINARY TRACT ULTRASOUND COMPLETE COMPARISON:  None. FINDINGS: Right Kidney: Renal measurements: 9.9 x 4.6 x 4.9 cm = volume: 115.3 mL. Increased cortical echogenicity Left Kidney: Renal measurements: 9.9 x 4.9 x 5.0 cm = volume: 127.5 mL. Increased cortical echogenicity Bladder: Appears normal for degree of bladder distention. IMPRESSION: Increased cortical echogenicity bilaterally consistent with medical renal disease. No hydronephrosis or other abnormality. Electronically Signed   By: Dorise Bullion III M.D   On: 01/07/2019 16:27      Discharge Exam: Vitals:   01/12/19 0539 01/12/19 0823  BP: (!) 163/58 (!) 181/78  Pulse:  69 74  Resp: 16   Temp: 98.3 F (36.8 C)   SpO2: 96%    Vitals:   01/11/19 2122 01/11/19 2326 01/12/19 0539 01/12/19 0823  BP: (!) 190/75 (!) 163/57 (!) 163/58 (!) 181/78  Pulse: 74 66 69 74  Resp: 16  16   Temp: 99.1 F (37.3 C)  98.3 F (36.8 C)   TempSrc: Oral  Oral   SpO2: 97%  96%   Weight:      Height:        General: Pt is alert, awake, not in acute distress Cardiovascular: RRR, S1/S2 +, no rubs, no gallops Respiratory: CTA bilaterally, no wheezing, no rhonchi Abdominal: Soft, NT, ND, bowel sounds + Extremities: no edema, no cyanosis    The results of significant diagnostics from this hospitalization (including imaging, microbiology, ancillary and laboratory) are listed below for reference.     Microbiology: Recent Results (from the past 240 hour(s))  Novel Coronavirus,NAA,(SEND-OUT TO REF LAB - TAT 24-48 hrs); Hosp Order     Status: None   Collection Time: 01/07/19  5:46 PM   Specimen: Nasopharyngeal Swab;  Respiratory  Result Value Ref Range Status   SARS-CoV-2, NAA NOT DETECTED NOT DETECTED Final    Comment: (NOTE) This test was developed and its performance characteristics determined by [employer]. This test has not been FDA cleared or approved. This test has been authorized by FDA under an Emergency Use Authorization (EUA). This test is only authorized for the duration of time the declaration that circumstances exist justifying the authorization of the emergency use of in vitro diagnostic tests for detection of SARS-CoV-2 virus and/or diagnosis of COVID-19 infection under section 564(b)(1) of the Act, 21 U.S.C. 564PPI-9(J)(1), unless the authorization is terminated or revoked sooner. When diagnostic testing is negative, the possibility of a false negative result should be considered in the context of a patient's recent exposures and the presence of clinical signs and symptoms consistent with COVID-19. An individual without symptoms of COVID-19 and who is not shedding SARS-CoV-2 virus would expect to have a negative (not detected) result in this assay. Performed  At: [facility] [place], [place] [id number] Rush Farmer MD KZ:[id number]    [place]  Final    Comment: Performed at [facility], [address], [place], [place] [address]     Labs: BNP (last 3 results) No results for input(s): BNP in the last 8760 hours. Basic Metabolic Panel: Recent Labs  Lab 01/08/19 0546 01/09/19 0635 01/10/19 0632 01/11/19 0619 01/12/19 0503  NA 141 140 139 133* 138  K 4.5 4.8 4.5 4.1 4.3  CL 114* 113* 114* 104 106  CO2 19* 19* 15* 17* 23  GLUCOSE 81 92 85 177* 110*  BUN 79* 74* 68* 70* 79*  CREATININE 6.34* 6.51* 6.21* 6.64* 7.10*  CALCIUM 7.3* 6.9* 6.9*  7.1* 6.8* 7.1*  PHOS  --  8.3* 7.0* 6.5* 6.0*   Liver Function Tests: Recent Labs  Lab 01/07/19 1342 01/08/19 0546 01/09/19 0635 01/10/19 0632 01/11/19 0619  01/12/19 0503  AST 22 19  --   --   --   --   ALT 16 14  --   --   --   --   ALKPHOS 56 47  --   --   --   --   BILITOT 0.6 0.7  --   --   --   --   PROT 5.7* 4.8*  --   --   --   --  ALBUMIN 2.4* 2.1* 2.1* 2.1* 2.0* 2.1*   No results for input(s): LIPASE, AMYLASE in the last 168 hours. No results for input(s): AMMONIA in the last 168 hours. CBC: Recent Labs  Lab 01/07/19 1342 01/08/19 0546 01/09/19 0635 01/10/19 0632 01/11/19 0619 01/12/19 0503  WBC 5.4 5.0 6.3 5.7 6.2 10.0  NEUTROABS 3.9  --   --   --   --   --   HGB 9.7* 8.7* 8.7* 8.8* 8.3* 8.3*  HCT 30.6* 28.3* 27.9* 28.3* 26.1* 26.3*  MCV 92.4 93.7 95.5 95.0 92.6 93.6  PLT 196 186 182 177 194 216   Cardiac Enzymes: Recent Labs  Lab 01/07/19 1546  CKTOTAL 90   BNP: Invalid input(s): POCBNP CBG: Recent Labs  Lab 01/11/19 1200 01/11/19 1645 01/11/19 2121 01/12/19 0746  GLUCAP 165* 143* 141* 92   D-Dimer No results for input(s): DDIMER in the last 72 hours. Hgb A1c Recent Labs    01/11/19 0619  HGBA1C 5.4   Lipid Profile No results for input(s): CHOL, HDL, LDLCALC, TRIG, CHOLHDL, LDLDIRECT in the last 72 hours. Thyroid function studies No results for input(s): TSH, T4TOTAL, T3FREE, THYROIDAB in the last 72 hours.  Invalid input(s): FREET3 Anemia work up No results for input(s): VITAMINB12, FOLATE, FERRITIN, TIBC, IRON, RETICCTPCT in the last 72 hours. Urinalysis    Component Value Date/Time   COLORURINE YELLOW 01/08/2019 0829   APPEARANCEUR HAZY (A) 01/08/2019 0829   LABSPEC 1.013 01/08/2019 0829   PHURINE 6.0 01/08/2019 0829   GLUCOSEU 50 (A) 01/08/2019 0829   HGBUR MODERATE (A) 01/08/2019 0829   BILIRUBINUR NEGATIVE 01/08/2019 0829   KETONESUR NEGATIVE 01/08/2019 0829   PROTEINUR >=300 (A) 01/08/2019 0829   UROBILINOGEN 0.2 01/11/2014 1635   NITRITE NEGATIVE 01/08/2019 0829   LEUKOCYTESUR LARGE (A) 01/08/2019 0829   Sepsis Labs Invalid input(s): PROCALCITONIN,  WBC,   LACTICIDVEN Microbiology Recent Results (from the past 240 hour(s))  Novel Coronavirus,NAA,(SEND-OUT TO REF LAB - TAT 24-48 hrs); Hosp Order     Status: None   Collection Time: 01/07/19  5:46 PM   Specimen: Nasopharyngeal Swab; Respiratory  Result Value Ref Range Status   SARS-CoV-2, NAA NOT DETECTED NOT DETECTED Final    Comment: (NOTE) This test was developed and its performance characteristics determined by [employer]. This test has not been FDA cleared or approved. This test has been authorized by FDA under an Emergency Use Authorization (EUA). This test is only authorized for the duration of time the declaration that circumstances exist justifying the authorization of the emergency use of in vitro diagnostic tests for detection of SARS-CoV-2 virus and/or diagnosis of COVID-19 infection under section 564(b)(1) of the Act, 21 U.S.C. 528UXL-2(G)(4), unless the authorization is terminated or revoked sooner. When diagnostic testing is negative, the possibility of a false negative result should be considered in the context of a patient's recent exposures and the presence of clinical signs and symptoms consistent with COVID-19. An individual without symptoms of COVID-19 and who is not shedding SARS-CoV-2 virus would expect to have a negative (not detected) result in this assay. Performed  At: [facility] [address] [place], [place] [id number] Rush Farmer MD UY:[id number]    [place]  Final    Comment: Performed at [facility], [address], [place], [place] [address]     Time coordinating discharge: 40 minutes  SIGNED:   Rodena Goldmann, DO [facility] 01/12/2019, 10:00 AM  If 7PM-7AM, please contact night-coverage [email or web address] Password [email or web address]

## 2019-01-12 NOTE — Progress Notes (Signed)
Patient discharge to home. Transportation home provided by granddaughter. Discharge paperwork and instructions verbally discussed with patient whom stated her understanding and denied any further questions. Patient brought to hospital exit and awaiting family via wheelchair.  Elodia Florence RN2 01/11/2021 1149

## 2019-01-12 NOTE — Care Management Important Message (Signed)
Important Message  Patient Details  Name: Felicia Williams MRN: [id number] Date of Birth: 11-21-1940   Medicare Important Message Given:  Yes     Tommy Medal 01/12/2019, 11:50 AM

## 2019-01-15 ENCOUNTER — Other Ambulatory Visit: Payer: Self-pay

## 2019-01-15 ENCOUNTER — Emergency Department (HOSPITAL_COMMUNITY): Payer: Medicare Other

## 2019-01-15 ENCOUNTER — Inpatient Hospital Stay (HOSPITAL_COMMUNITY)
Admission: EM | Admit: 2019-01-15 | Discharge: 2019-01-20 | DRG: 673 | Disposition: A | Discharge status: Home / Self Care | Payer: Medicare Other | Attending: Family Medicine | Admitting: Family Medicine

## 2019-01-15 ENCOUNTER — Encounter (HOSPITAL_COMMUNITY): Payer: Self-pay | Admitting: Emergency Medicine

## 2019-01-15 ENCOUNTER — Other Ambulatory Visit (HOSPITAL_COMMUNITY): Payer: Medicare Other

## 2019-01-15 ENCOUNTER — Ambulatory Visit (HOSPITAL_COMMUNITY): Payer: Medicare Other

## 2019-01-15 DIAGNOSIS — I120 Hypertensive chronic kidney disease with stage 5 chronic kidney disease or end stage renal disease: Principal | ICD-10-CM | POA: Diagnosis present

## 2019-01-15 DIAGNOSIS — I10 Essential (primary) hypertension: Secondary | ICD-10-CM | POA: Diagnosis not present

## 2019-01-15 DIAGNOSIS — M069 Rheumatoid arthritis, unspecified: Secondary | ICD-10-CM | POA: Diagnosis present

## 2019-01-15 DIAGNOSIS — Z8249 Family history of ischemic heart disease and other diseases of the circulatory system: Secondary | ICD-10-CM | POA: Diagnosis not present

## 2019-01-15 DIAGNOSIS — K219 Gastro-esophageal reflux disease without esophagitis: Secondary | ICD-10-CM | POA: Diagnosis present

## 2019-01-15 DIAGNOSIS — D631 Anemia in chronic kidney disease: Secondary | ICD-10-CM | POA: Diagnosis present

## 2019-01-15 DIAGNOSIS — N185 Chronic kidney disease, stage 5: Secondary | ICD-10-CM | POA: Diagnosis not present

## 2019-01-15 DIAGNOSIS — N179 Acute kidney failure, unspecified: Secondary | ICD-10-CM | POA: Diagnosis present

## 2019-01-15 DIAGNOSIS — M109 Gout, unspecified: Secondary | ICD-10-CM | POA: Diagnosis present

## 2019-01-15 DIAGNOSIS — Z1159 Encounter for screening for other viral diseases: Secondary | ICD-10-CM

## 2019-01-15 DIAGNOSIS — E872 Acidosis: Secondary | ICD-10-CM | POA: Diagnosis present

## 2019-01-15 DIAGNOSIS — Z7952 Long term (current) use of systemic steroids: Secondary | ICD-10-CM

## 2019-01-15 DIAGNOSIS — Z992 Dependence on renal dialysis: Secondary | ICD-10-CM | POA: Diagnosis not present

## 2019-01-15 DIAGNOSIS — Z79899 Other long term (current) drug therapy: Secondary | ICD-10-CM

## 2019-01-15 DIAGNOSIS — N186 End stage renal disease: Secondary | ICD-10-CM | POA: Diagnosis present

## 2019-01-15 DIAGNOSIS — R9431 Abnormal electrocardiogram [ECG] [EKG]: Secondary | ICD-10-CM | POA: Diagnosis present

## 2019-01-15 DIAGNOSIS — Z833 Family history of diabetes mellitus: Secondary | ICD-10-CM | POA: Diagnosis not present

## 2019-01-15 DIAGNOSIS — E782 Mixed hyperlipidemia: Secondary | ICD-10-CM | POA: Diagnosis present

## 2019-01-15 DIAGNOSIS — E611 Iron deficiency: Secondary | ICD-10-CM | POA: Diagnosis present

## 2019-01-15 DIAGNOSIS — F419 Anxiety disorder, unspecified: Secondary | ICD-10-CM | POA: Diagnosis present

## 2019-01-15 DIAGNOSIS — F329 Major depressive disorder, single episode, unspecified: Secondary | ICD-10-CM | POA: Diagnosis present

## 2019-01-15 DIAGNOSIS — M810 Age-related osteoporosis without current pathological fracture: Secondary | ICD-10-CM | POA: Diagnosis present

## 2019-01-15 DIAGNOSIS — Z832 Family history of diseases of the blood and blood-forming organs and certain disorders involving the immune mechanism: Secondary | ICD-10-CM

## 2019-01-15 DIAGNOSIS — J90 Pleural effusion, not elsewhere classified: Secondary | ICD-10-CM

## 2019-01-15 DIAGNOSIS — N19 Unspecified kidney failure: Secondary | ICD-10-CM | POA: Diagnosis present

## 2019-01-15 DIAGNOSIS — N2581 Secondary hyperparathyroidism of renal origin: Secondary | ICD-10-CM | POA: Diagnosis present

## 2019-01-15 LAB — COMPREHENSIVE METABOLIC PANEL
ALT: 22 U/L (ref 0–44)
AST: 25 U/L (ref 15–41)
Albumin: 2.4 g/dL — ABNORMAL LOW (ref 3.5–5.0)
Alkaline Phosphatase: 50 U/L (ref 38–126)
Anion gap: 17 — ABNORMAL HIGH (ref 5–15)
BUN: 98 mg/dL — ABNORMAL HIGH (ref 8–23)
CO2: 20 mmol/L — ABNORMAL LOW (ref 22–32)
Calcium: 7.3 mg/dL — ABNORMAL LOW (ref 8.9–10.3)
Chloride: 101 mmol/L (ref 98–111)
Creatinine, Ser: 7.46 mg/dL — ABNORMAL HIGH (ref 0.44–1.00)
GFR calc Af Amer: 6 mL/min — ABNORMAL LOW (ref >60)
GFR calc non Af Amer: 5 mL/min — ABNORMAL LOW (ref >60)
Glucose, Bld: 106 mg/dL — ABNORMAL HIGH (ref 70–99)
Potassium: 3.6 mmol/L (ref 3.5–5.1)
Sodium: 138 mmol/L (ref 135–145)
Total Bilirubin: 0.5 mg/dL (ref 0.3–1.2)
Total Protein: 5.2 g/dL — ABNORMAL LOW (ref 6.5–8.1)

## 2019-01-15 LAB — CBC WITH DIFFERENTIAL/PLATELET
Abs Immature Granulocytes: 0.53 10*3/uL — ABNORMAL HIGH (ref 0.00–0.07)
Basophils Absolute: 0 10*3/uL (ref 0.0–0.1)
Basophils Relative: 0 %
Eosinophils Absolute: 0.2 10*3/uL (ref 0.0–0.5)
Eosinophils Relative: 2 %
HCT: 29.5 % — ABNORMAL LOW (ref 36.0–46.0)
Hemoglobin: 9.3 g/dL — ABNORMAL LOW (ref 12.0–15.0)
Immature Granulocytes: 5 %
Lymphocytes Relative: 9 %
Lymphs Abs: 0.9 10*3/uL (ref 0.7–4.0)
MCH: 30.2 pg (ref 26.0–34.0)
MCHC: 31.5 g/dL (ref 30.0–36.0)
MCV: 95.8 fL (ref 80.0–100.0)
Monocytes Absolute: 0.9 10*3/uL (ref 0.1–1.0)
Monocytes Relative: 9 %
Neutro Abs: 7.3 10*3/uL (ref 1.7–7.7)
Neutrophils Relative %: 75 %
Platelets: 251 10*3/uL (ref 150–400)
RBC: 3.08 MIL/uL — ABNORMAL LOW (ref 3.87–5.11)
RDW: 14.9 % (ref 11.5–15.5)
WBC: 9.8 10*3/uL (ref 4.0–10.5)
nRBC: 0.3 % — ABNORMAL HIGH (ref 0.0–0.2)

## 2019-01-15 LAB — URINALYSIS, ROUTINE W REFLEX MICROSCOPIC
Bilirubin Urine: NEGATIVE
Glucose, UA: 50 mg/dL — AB
Ketones, ur: NEGATIVE mg/dL
Leukocytes,Ua: NEGATIVE
Nitrite: NEGATIVE
Protein, ur: >=300 mg/dL — AB
RBC / HPF: >50 RBC/hpf — ABNORMAL HIGH (ref 0–5)
Specific Gravity, Urine: 1.014 (ref 1.005–1.030)
pH: 7 (ref 5.0–8.0)

## 2019-01-15 LAB — SARS CORONAVIRUS 2 BY RT PCR (HOSPITAL ORDER, PERFORMED IN ~~LOC~~ HOSPITAL LAB): SARS Coronavirus 2: NEGATIVE

## 2019-01-15 LAB — CK: Total CK: 100 U/L (ref 38–234)

## 2019-01-15 LAB — MAGNESIUM: Magnesium: 2.3 mg/dL (ref 1.7–2.4)

## 2019-01-15 MED ORDER — HYDRALAZINE HCL 20 MG/ML IJ SOLN
10.0000 mg | INTRAMUSCULAR | Status: DC | PRN
  Administered 2019-01-18: 10 mg via INTRAVENOUS
  Filled 2019-01-15: qty 1

## 2019-01-15 MED ORDER — ONDANSETRON HCL 4 MG/2ML IJ SOLN: 4.0000 mg | Freq: Four times a day (QID) | INTRAMUSCULAR | Status: DC | PRN

## 2019-01-15 MED ORDER — CALCITRIOL 0.25 MCG PO CAPS
0.2500 ug | ORAL_CAPSULE | Freq: Every day | ORAL | Status: DC
  Administered 2019-01-15 – 2019-01-20 (×6): 0.25 ug via ORAL
  Filled 2019-01-15 (×5): qty 1

## 2019-01-15 MED ORDER — ACETAMINOPHEN 650 MG RE SUPP: 650.0000 mg | Freq: Four times a day (QID) | RECTAL | Status: DC | PRN

## 2019-01-15 MED ORDER — HYDRALAZINE HCL 50 MG PO TABS
50.0000 mg | ORAL_TABLET | Freq: Two times a day (BID) | ORAL | Status: DC
  Administered 2019-01-15 – 2019-01-20 (×8): 50 mg via ORAL
  Filled 2019-01-15 (×4): qty 1
  Filled 2019-01-15: qty 2
  Filled 2019-01-15 (×5): qty 1

## 2019-01-15 MED ORDER — SODIUM BICARBONATE 650 MG PO TABS
1300.0000 mg | ORAL_TABLET | Freq: Three times a day (TID) | ORAL | Status: DC
  Administered 2019-01-15 – 2019-01-16 (×2): 1300 mg via ORAL
  Filled 2019-01-15 (×2): qty 2

## 2019-01-15 MED ORDER — FUROSEMIDE 10 MG/ML IJ SOLN
40.0000 mg | Freq: Once | INTRAMUSCULAR | Status: AC
  Administered 2019-01-15: 40 mg via INTRAVENOUS
  Filled 2019-01-15: qty 4

## 2019-01-15 MED ORDER — CARVEDILOL 12.5 MG PO TABS
12.5000 mg | ORAL_TABLET | Freq: Two times a day (BID) | ORAL | Status: DC
  Administered 2019-01-15 – 2019-01-20 (×9): 12.5 mg via ORAL
  Filled 2019-01-15 (×9): qty 1

## 2019-01-15 MED ORDER — PREDNISONE 20 MG PO TABS
30.0000 mg | ORAL_TABLET | Freq: Every day | ORAL | Status: AC
  Administered 2019-01-15: 30 mg via ORAL
  Filled 2019-01-15: qty 1

## 2019-01-15 MED ORDER — PREDNISONE 5 MG PO TABS: 5.0000 mg | ORAL_TABLET | Freq: Every day | ORAL | Status: DC

## 2019-01-15 MED ORDER — PREDNISONE 5 MG PO TABS
25.0000 mg | ORAL_TABLET | Freq: Every day | ORAL | Status: DC
  Administered 2019-01-18 – 2019-01-20 (×3): 25 mg via ORAL
  Filled 2019-01-15 (×3): qty 1

## 2019-01-15 MED ORDER — POLYETHYLENE GLYCOL 3350 17 G PO PACK
17.0000 g | PACK | Freq: Every day | ORAL | Status: DC | PRN
  Filled 2019-01-15: qty 1

## 2019-01-15 MED ORDER — FUROSEMIDE 10 MG/ML IJ SOLN
60.0000 mg | Freq: Once | INTRAMUSCULAR | Status: AC
  Administered 2019-01-15: 60 mg via INTRAVENOUS
  Filled 2019-01-15: qty 6

## 2019-01-15 MED ORDER — ACETAMINOPHEN 325 MG PO TABS
650.0000 mg | ORAL_TABLET | Freq: Four times a day (QID) | ORAL | Status: DC | PRN
  Administered 2019-01-15: 650 mg via ORAL
  Filled 2019-01-15: qty 2

## 2019-01-15 MED ORDER — PANTOPRAZOLE SODIUM 40 MG PO TBEC
40.0000 mg | DELAYED_RELEASE_TABLET | Freq: Every day | ORAL | Status: DC
  Administered 2019-01-15 – 2019-01-20 (×6): 40 mg via ORAL
  Filled 2019-01-15 (×6): qty 1

## 2019-01-15 MED ORDER — PREDNISONE 20 MG PO TABS
30.0000 mg | ORAL_TABLET | Freq: Every day | ORAL | Status: AC
  Administered 2019-01-16 – 2019-01-17 (×2): 30 mg via ORAL
  Filled 2019-01-15 (×2): qty 1

## 2019-01-15 MED ORDER — ONDANSETRON HCL 4 MG/2ML IJ SOLN
4.0000 mg | Freq: Once | INTRAMUSCULAR | Status: AC
  Administered 2019-01-15: 14:00:00 4 mg via INTRAVENOUS
  Filled 2019-01-15: qty 2

## 2019-01-15 MED ORDER — PREDNISONE 20 MG PO TABS: 20.0000 mg | ORAL_TABLET | Freq: Every day | ORAL | Status: DC

## 2019-01-15 MED ORDER — ONDANSETRON HCL 4 MG PO TABS: 4.0000 mg | ORAL_TABLET | Freq: Four times a day (QID) | ORAL | Status: DC | PRN

## 2019-01-15 MED ORDER — PREDNISONE 5 MG PO TABS: 15.0000 mg | ORAL_TABLET | Freq: Every day | ORAL | Status: DC

## 2019-01-15 MED ORDER — FUROSEMIDE 10 MG/ML IJ SOLN: 40.0000 mg | Freq: Once | INTRAMUSCULAR | Status: DC

## 2019-01-15 MED ORDER — PREDNISONE 10 MG PO TABS: 10.0000 mg | ORAL_TABLET | Freq: Every day | ORAL | Status: DC

## 2019-01-15 MED ORDER — CALCIUM ACETATE (PHOS BINDER) 667 MG PO CAPS
667.0000 mg | ORAL_CAPSULE | Freq: Three times a day (TID) | ORAL | Status: DC
  Administered 2019-01-16 – 2019-01-20 (×11): 667 mg via ORAL
  Filled 2019-01-15 (×12): qty 1

## 2019-01-15 MED ORDER — HYDRALAZINE HCL 20 MG/ML IJ SOLN
5.0000 mg | Freq: Once | INTRAMUSCULAR | Status: AC
  Administered 2019-01-15: 5 mg via INTRAVENOUS
  Filled 2019-01-15: qty 1

## 2019-01-15 NOTE — ED Notes (Signed)
Spoke with [facility] will send a truck around [employer] for transport.

## 2019-01-15 NOTE — H&P (Signed)
History and Physical    Walburga Hudman EKC:[id number] DOB: Jun 02, 1941 DOA: 01/15/2019  PCP: Octavio Graves, DO   Patient coming from: Home  I have personally briefly reviewed patient's old medical records in [place]  Chief Complaint: SOB, leg swelling  HPI: Felicia Williams is a 78 y.o. female with medical history significant for glomerulonephritis with progressive renal failure, hypertension, psoriasis, hypertension, anxiety and depression who presented to the ED with complaints of difficulty breathing and lower extremity swelling with nausea and vomiting since hospital discharge 3 days ago.  He also reports mild epigastric pain over past few weeks, abdominal bloating and weight gain.  She has gained 10 pounds since hospitalization, decreasing urinary output despite compliance with Lasix 80 mg twice daily. Recent hospitalization 6/17- 6/22 for acute kidney injury, patient was hydrated, and bicarbonate given, nephrology consulted.  There was no improvement in renal function, so patient was discharged to follow-up closely with nephrology, it was specked at that dialysis will need to be initiated soon.  Stay was also complicated by rheumatoid arthritis flare requiring steroids.  Discharge creatinine was 7.1 She has been following with nephrology-Dr. Gomez Cleverly at [facility] for glomerulonephritis process several years.  She was also placed in the clinical trial with [facility] to see if her renal function would improve.  Patient does not have dialysis access established yet.  ED Course: Blood pressure systolic 505W to 979, O2 sats >96% on room air.  Two-view chest x-ray trace bilateral pleural effusions, creatinine 7.46.  BUN 98.  Stable hemoglobin 9.3.  EKG sinus rhythm no significant ST-T wave abnormalities.  UA- >300 protein, red blood cells present.  60mg  IV Lasix given x1.  EDP talked with nephrologist Dr. Hollie Salk who agreed that patient needs to start dialysis and to be transferred to [facility], to give additional 40 mg of IV Lasix, totaling 100 mg of Lasix.  Review of Systems: As per HPI all other systems reviewed and negative.  Past Medical History:  Diagnosis Date  . Anemia in stage 4 chronic kidney disease ([place]) 08/18/2018  . Depression   . Essential hypertension, benign   . GERD (gastroesophageal reflux disease)   . Glomerulonephritis   . Gout   . Mixed hyperlipidemia   . Renal insufficiency     Past Surgical History:  Procedure Laterality Date  . APPENDECTOMY    . CATARACT EXTRACTION    . COLONOSCOPY N/A 12/28/2015   Procedure: COLONOSCOPY;  Surgeon: Rogene Houston, MD;  Location: [facility];  Service: Endoscopy;  Laterality: N/A;  815  . TUBAL LIGATION       reports that she has never smoked. She has never used smokeless tobacco. She reports that she does not drink alcohol or use drugs.  Allergies  Allergen Reactions  . Sulfa Antibiotics Other (See Comments)    Shut pt's kidneys down    Family History  Problem Relation Age of Onset  . CAD Father   . Heart attack Father   . Diabetes Mellitus II Father   . Hypertension Father   . Lupus Brother     Prior to Admission medications   Medication Sig Start Date End Date Taking? Authorizing Provider  calcitRIOL (ROCALTROL) 0.25 MCG capsule Take 1 capsule (0.25 mcg total) by mouth daily for 30 days. 01/13/19 02/12/19 Yes Shah, Pratik D, DO  calcium acetate (PHOSLO) 667 MG capsule Take 1 capsule (667 mg total) by mouth 3 (three) times daily with meals for 30 days. 01/12/19 02/11/19 Yes Manuella Ghazi,  Pratik D, DO  carvedilol (COREG) 12.5 MG tablet Take 12.5 mg by mouth 2 (two) times daily with a meal.  03/01/15  Yes [provider]  furosemide (LASIX) 80 MG tablet Take 1 tablet (80 mg total) by mouth 2 (two) times daily for 30 days. 01/12/19 02/11/19 Yes Shah, Pratik D, DO  hydrALAZINE (APRESOLINE) 50 MG tablet Take 50 mg by mouth 2 (two) times a day.  09/15/18  Yes [provider]  loperamide  (IMODIUM) 2 MG capsule Take 1 capsule (2 mg total) by mouth as needed for diarrhea or loose stools. 01/12/19  Yes Manuella Ghazi, Pratik D, DO  ondansetron (ZOFRAN-ODT) 4 MG disintegrating tablet Take 1 tablet (4 mg total) by mouth every 8 (eight) hours as needed for nausea or vomiting. 01/12/19  Yes Manuella Ghazi, Pratik D, DO  pantoprazole (PROTONIX) 40 MG tablet Take 40 mg by mouth daily.  12/09/17  Yes [provider]  predniSONE (DELTASONE) 10 MG tablet Take 3 tablets (30 mg total) by mouth daily with breakfast for 5 days, THEN 2.5 tablets (25 mg total) daily with breakfast for 5 days, THEN 2 tablets (20 mg total) daily with breakfast for 5 days, THEN 1.5 tablets (15 mg total) daily with breakfast for 5 days, THEN 1 tablet (10 mg total) daily with breakfast for 5 days, THEN 0.5 tablets (5 mg total) daily with breakfast for 5 days. 01/13/19 02/12/19 Yes Shah, Pratik D, DO  sodium bicarbonate 650 MG tablet Take 2 tablets (1,300 mg total) by mouth 3 (three) times daily for 30 days. 01/12/19 02/11/19 Yes Shah, Pratik D, DO  Vitamin D, Cholecalciferol, 50 MCG (2000 UT) CAPS Take 2,000 Units by mouth daily.    Yes [provider]  epoetin alfa (EPOGEN) 20000 UNIT/ML injection Inject 20,000 Units into the skin every 14 (fourteen) days.    [provider]    Physical Exam: Vitals:   01/15/19 1500 01/15/19 1530 01/15/19 1545 01/15/19 1600  BP: (!) 186/69 (!) 196/75  (!) 183/71  Pulse: 61     Resp: 10 (!) 24 19 15   Temp:      TempSrc:      SpO2: 96%     Weight:      Height:        Constitutional: NAD, calm, comfortable Vitals:   01/15/19 1500 01/15/19 1530 01/15/19 1545 01/15/19 1600  BP: (!) 186/69 (!) 196/75  (!) 183/71  Pulse: 61     Resp: 10 (!) 24 19 15   Temp:      TempSrc:      SpO2: 96%     Weight:      Height:       Eyes: PERRL, lids and conjunctivae normal ENMT: Mucous membranes are moist. Posterior pharynx clear of any exudate or lesions. Neck: normal, supple, no masses, no  thyromegaly Respiratory: no wheezing, mild scattered crackles. Normal respiratory effort. No accessory muscle use.  Cardiovascular: Regular rate and rhythm, no murmurs / rubs / gallops. 2+ pitting pedal edema to upper legs. 2+ pedal pulses.  Abdomen: no tenderness, no masses palpated. No hepatosplenomegaly. Bowel sounds positive.  No asterixis. Musculoskeletal: no clubbing / cyanosis. No joint deformity upper and lower extremities. Good ROM, no contractures. Normal muscle tone.  Skin: no rashes, lesions, ulcers. No induration Neurologic: CN 2-12 grossly intact. Strength 5/5 in all 4.  Psychiatric: Normal judgment and insight. Alert and oriented x 3. Normal mood.   Labs on Admission: I have personally reviewed following labs and imaging studies  CBC: Recent Labs  Lab 01/09/19 0635 01/10/19 0632 01/11/19 0619 01/12/19 0503 01/15/19 1423  WBC 6.3 5.7 6.2 10.0 9.8  NEUTROABS  --   --   --   --  7.3  HGB 8.7* 8.8* 8.3* 8.3* 9.3*  HCT 27.9* 28.3* 26.1* 26.3* 29.5*  MCV 95.5 95.0 92.6 93.6 95.8  PLT 182 177 194 216 846   Basic Metabolic Panel: Recent Labs  Lab 01/09/19 0635 01/10/19 0632 01/11/19 0619 01/12/19 0503 01/15/19 1423  NA 140 139 133* 138 138  K 4.8 4.5 4.1 4.3 3.6  CL 113* 114* 104 106 101  CO2 19* 15* 17* 23 20*  GLUCOSE 92 85 177* 110* 106*  BUN 74* 68* 70* 79* 98*  CREATININE 6.51* 6.21* 6.64* 7.10* 7.46*  CALCIUM 6.9* 6.9*  7.1* 6.8* 7.1* 7.3*  PHOS 8.3* 7.0* 6.5* 6.0*  --    Liver Function Tests: Recent Labs  Lab 01/09/19 0635 01/10/19 0632 01/11/19 0619 01/12/19 0503 01/15/19 1423  AST  --   --   --   --  25  ALT  --   --   --   --  22  ALKPHOS  --   --   --   --  50  BILITOT  --   --   --   --  0.5  PROT  --   --   --   --  5.2*  ALBUMIN 2.1* 2.1* 2.0* 2.1* 2.4*   Cardiac Enzymes: Recent Labs  Lab 01/15/19 1423  CKTOTAL 100   CBG: Recent Labs  Lab 01/11/19 1200 01/11/19 1645 01/11/19 2121 01/12/19 0746  GLUCAP 165* 143* 141* 92    Urine analysis:    Component Value Date/Time   COLORURINE YELLOW 01/15/2019 1356   APPEARANCEUR CLEAR 01/15/2019 1356   LABSPEC 1.014 01/15/2019 1356   PHURINE 7.0 01/15/2019 1356   GLUCOSEU 50 (A) 01/15/2019 1356   HGBUR SMALL (A) 01/15/2019 1356   BILIRUBINUR NEGATIVE 01/15/2019 1356   KETONESUR NEGATIVE 01/15/2019 1356   PROTEINUR >=300 (A) 01/15/2019 1356   UROBILINOGEN 0.2 01/11/2014 1635   NITRITE NEGATIVE 01/15/2019 1356   LEUKOCYTESUR NEGATIVE 01/15/2019 1356    Radiological Exams on Admission: Dg Chest 2 View  Result Date: 01/15/2019 CLINICAL DATA:  Shortness of breath with worsening peripheral edema. EXAM: CHEST - 2 VIEW COMPARISON:  Chest x-ray dated July 18, 2016. FINDINGS: The heart is at the upper limits of normal in size. Normal mediastinal contours. Normal pulmonary vascularity. Trace bilateral pleural effusions. Minimal atelectasis at the lung bases and in the peripheral lingula and right upper lobe. No consolidation or pneumothorax. No acute osseous abnormality. IMPRESSION: 1. Trace bilateral pleural effusions. Electronically Signed   By: Titus Dubin M.D.   On: 01/15/2019 15:21    EKG: Independently reviewed.  Sinus rhythm rate 68.  QTC prolonged 513.  No significant ST or T wave abnormalities.  Assessment/Plan Active Problems:   Renal failure   Stage V CKD- with uremia, nausea vomiting epigastric pain, volume overload, with a blood pressure.  Chest x-ray trace bilateral pleural effusions.  Creatinine elevated 7.46, BUN 98.  Reported reduced urine output, again despite Lasix 80 mg twice daily.  HD access not established.  EDP talked to Dr. Hollie Salk, recommended transfer to [facility] to initiate dialysis, 100 mg total of Lasix given in ED. -Nephrology to see patient on [facility] to [facility] -Further Lasix dosing per nephrology  Prolonged QTC- 513.  Potassium 3.6 -Magnesium -Avoid QT prolonging  medications.  Hypertension-Systolic 838F to 840R.  -Hydralazine 10mg  PRN -Resume home Coreg, hydralazine  Rheumatoid arthritis- had a flare on recent hospitalization involving her upper extremities.  Discharged home on prednisone taper. -Complete prednisone taper  DVT prophylaxis: SCds Code Status: Full Family Communication: None at bedside Disposition Plan: > 2 days Consults called: Nephrology Admission status: Inpt, tele I certify that at the point of admission it is my clinical judgment that the patient will require inpatient hospital care spanning beyond 2 midnights from the point of admission due to high intensity of service, high risk for further deterioration and high frequency of surveillance required. The following factors support the patient status of inpatient: Progressive renal failure with uremia requiring initiation of dialysis.   Bethena Roys MD [facility]  01/15/2019, 5:28 PM

## 2019-01-15 NOTE — ED Notes (Signed)
[facility] called for transport [facility] 

## 2019-01-15 NOTE — ED Triage Notes (Signed)
Patient just discharged from the hospital on Monday. Admitted for kidney failure and fluid retention. Patient states she is on 160 mg of Lasix and she is no better. BP elevated in Triage.

## 2019-01-15 NOTE — Plan of Care (Signed)
  Problem: Education: Goal: Knowledge of General Education information will improve Description: Including pain rating scale, medication(s)/side effects and non-pharmacologic comfort measures Outcome: Progressing   Problem: Health Behavior/Discharge Planning: Goal: Ability to manage health-related needs will improve Outcome: Progressing   Problem: Activity: Goal: Risk for activity intolerance will decrease Outcome: Progressing   

## 2019-01-15 NOTE — ED Provider Notes (Signed)
[facility] EMERGENCY DEPARTMENT Provider Note   CSN: [id number] Arrival date & time: 01/15/19  1325    History   Chief Complaint Chief Complaint  Patient presents with  . Fluid Retention    HPI Felicia Williams is a 78 y.o. female with history of CKD, anemia of chronic disease, iron infusions, HTN, HLD, GERD, RA, recent hospital admission for AKI 6/17-6/20,  presents to the ER for "buildup fluid" that has been worsening since hospital dischargs.  Reports this morning she woke up and had breakfast, she felt slightly nauseous after eating and tried to take her medicine but she vomited x1 today after trying to take her hydralazine. she called nephrology office and was told to come to ER. Since hospital discharge she has had persistent fatigue, intermittent nausea, shortness of breath at rest and on exertion.  She feels uncomfortable with laying down but has been using 1 pillow only unchanged.  She thinks she has gained about 10 pounds of weight since hospital discharge.  She is approximately 130#now and was 120#at discharge.  She is compliant with 80 mg lasix BID but feels like she isn't urinating as much as she should. She was admitted recently for AKI.  She was placed on IV fluids, sodium bicarbonate per nephrology but she did not respond appropriately and it was thought that her CKD was progressing.  She did not require HD during last admission.  She was discharged with Lasix and follow-up with nephrology.  She has an appointment with Dr. Hollie Salk in nephrology on 6/29. She was in a clinical trial with Jardiance to help with renal function but this was stopped at last hospital admission. Has to epigastric abdominal pain that is worse with meals, feels like "food gets stuck in there".   No fever, cough, exertional CP, asymmetric LE edema or calf pain.      HPI  Past Medical History:  Diagnosis Date  . Anemia in stage 4 chronic kidney disease ([place]) 08/18/2018  . Depression   . Essential  hypertension, benign   . GERD (gastroesophageal reflux disease)   . Glomerulonephritis   . Gout   . Mixed hyperlipidemia   . Renal insufficiency     Patient Active Problem List   Diagnosis Date Noted  . Renal failure 01/15/2019  . ARF (acute renal failure) ([place]) 01/07/2019  . Glomerulonephritis 01/07/2019  . Anxiety and depression 01/07/2019  . Psoriasis 01/07/2019  . Tachycardia, unspecified 01/07/2019  . Anemia in stage 4 chronic kidney disease ([place]) 08/18/2018  . Essential hypertension, benign 01/26/2014  . Palpitations 01/26/2014  . Fatigue due to depression 01/26/2014    Past Surgical History:  Procedure Laterality Date  . APPENDECTOMY    . CATARACT EXTRACTION    . COLONOSCOPY N/A 12/28/2015   Procedure: COLONOSCOPY;  Surgeon: Rogene Houston, MD;  Location: [facility]O [facility];  Service: Endoscopy;  Laterality: N/A;  815  . TUBAL LIGATION       OB History    Gravida  2   Para  2   Term  2   Preterm      AB      Living        SAB      TAB      Ectopic      Multiple      Live Births               Home Medications    Prior to Admission medications   Medication Sig Start  Date End Date Taking? Authorizing Provider  calcitRIOL (ROCALTROL) 0.25 MCG capsule Take 1 capsule (0.25 mcg total) by mouth daily for 30 days. 01/13/19 02/12/19 Yes Shah, Pratik D, DO  calcium acetate (PHOSLO) 667 MG capsule Take 1 capsule (667 mg total) by mouth 3 (three) times daily with meals for 30 days. 01/12/19 02/11/19 Yes Shah, Pratik D, DO  carvedilol (COREG) 12.5 MG tablet Take 12.5 mg by mouth 2 (two) times daily with a meal.  03/01/15  Yes [provider]  furosemide (LASIX) 80 MG tablet Take 1 tablet (80 mg total) by mouth 2 (two) times daily for 30 days. 01/12/19 02/11/19 Yes Shah, Pratik D, DO  hydrALAZINE (APRESOLINE) 50 MG tablet Take 50 mg by mouth 2 (two) times a day.  09/15/18  Yes [provider]  loperamide (IMODIUM) 2 MG capsule Take 1 capsule (2 mg  total) by mouth as needed for diarrhea or loose stools. 01/12/19  Yes Manuella Ghazi, Pratik D, DO  ondansetron (ZOFRAN-ODT) 4 MG disintegrating tablet Take 1 tablet (4 mg total) by mouth every 8 (eight) hours as needed for nausea or vomiting. 01/12/19  Yes Manuella Ghazi, Pratik D, DO  pantoprazole (PROTONIX) 40 MG tablet Take 40 mg by mouth daily.  12/09/17  Yes [provider]  predniSONE (DELTASONE) 10 MG tablet Take 3 tablets (30 mg total) by mouth daily with breakfast for 5 days, THEN 2.5 tablets (25 mg total) daily with breakfast for 5 days, THEN 2 tablets (20 mg total) daily with breakfast for 5 days, THEN 1.5 tablets (15 mg total) daily with breakfast for 5 days, THEN 1 tablet (10 mg total) daily with breakfast for 5 days, THEN 0.5 tablets (5 mg total) daily with breakfast for 5 days. 01/13/19 02/12/19 Yes Shah, Pratik D, DO  sodium bicarbonate 650 MG tablet Take 2 tablets (1,300 mg total) by mouth 3 (three) times daily for 30 days. 01/12/19 02/11/19 Yes Shah, Pratik D, DO  Vitamin D, Cholecalciferol, 50 MCG (2000 UT) CAPS Take 2,000 Units by mouth daily.    Yes [provider]  epoetin alfa (EPOGEN) 20000 UNIT/ML injection Inject 20,000 Units into the skin every 14 (fourteen) days.    [provider]    Family History Family History  Problem Relation Age of Onset  . CAD Father   . Heart attack Father   . Diabetes Mellitus II Father   . Hypertension Father   . Lupus Brother     Social History Social History   Tobacco Use  . Smoking status: Never Smoker  . Smokeless tobacco: Never Used  Substance Use Topics  . Alcohol use: No  . Drug use: No     Allergies   Sulfa antibiotics   Review of Systems Review of Systems  Constitutional: Positive for fatigue.  Cardiovascular: Positive for leg swelling.  Gastrointestinal: Positive for abdominal pain, nausea and vomiting.  All other systems reviewed and are negative.    Physical Exam Updated Vital Signs BP (!) 186/69    Pulse 61   Temp 98 F (36.7 C) (Oral)   Resp 10   Ht 4\' 11"  (1.499 m)   Wt 60.2 kg   SpO2 96%   BMI 26.80 kg/m   Physical Exam Vitals signs and nursing note reviewed.  Constitutional:      Appearance: She is well-developed.     Comments: Non toxic in NAD  HENT:     Head: Normocephalic and atraumatic.     Nose: Nose normal.  Eyes:  Conjunctiva/sclera: Conjunctivae normal.  Neck:     Musculoskeletal: Normal range of motion.  Cardiovascular:     Rate and Rhythm: Normal rate and regular rhythm.     Comments: 1+ radial and DP pulses bilaterally.  Mild pitting edema, symmetric, to the feet and distal tib-fib.  No calf tenderness. Pulmonary:     Effort: Pulmonary effort is normal.     Comments: Diminished lung sounds to lower lobes bilaterally, difficult exam due to body habitus.  Speaking in full sentences.  No respiratory distress. Abdominal:     General: Bowel sounds are normal.     Palpations: Abdomen is soft.     Tenderness: There is no abdominal tenderness.     Comments: No G/R/R. No suprapubic or CVA tenderness. Negative Murphy's and McBurney's. Active BS to lower quadrants.   Musculoskeletal: Normal range of motion.  Skin:    General: Skin is warm and dry.     Capillary Refill: Capillary refill takes less than 2 seconds.  Neurological:     Mental Status: She is alert.  Psychiatric:        Behavior: Behavior normal.      ED Treatments / Results  Labs (all labs ordered are listed, but only abnormal results are displayed) Labs Reviewed  CBC WITH DIFFERENTIAL/PLATELET - Abnormal; Notable for the following components:      Result Value   RBC 3.08 (*)    Hemoglobin 9.3 (*)    HCT 29.5 (*)    nRBC 0.3 (*)    Abs Immature Granulocytes 0.53 (*)    All other components within normal limits  COMPREHENSIVE METABOLIC PANEL - Abnormal; Notable for the following components:   CO2 20 (*)    Glucose, Bld 106 (*)    BUN 98 (*)    Creatinine, Ser 7.46 (*)    Calcium 7.3  (*)    Total Protein 5.2 (*)    Albumin 2.4 (*)    GFR calc non Af Amer 5 (*)    GFR calc Af Amer 6 (*)    Anion gap 17 (*)    All other components within normal limits  URINALYSIS, ROUTINE W REFLEX MICROSCOPIC - Abnormal; Notable for the following components:   Glucose, UA 50 (*)    Hgb urine dipstick SMALL (*)    Protein, ur >=300 (*)    RBC / HPF >50 (*)    Bacteria, UA RARE (*)    All other components within normal limits  NOVEL CORONAVIRUS, NAA (HOSPITAL ORDER, SEND-OUT TO REF LAB)  CK    EKG None  Radiology Dg Chest 2 View  Result Date: 01/15/2019 CLINICAL DATA:  Shortness of breath with worsening peripheral edema. EXAM: CHEST - 2 VIEW COMPARISON:  Chest x-ray dated July 18, 2016. FINDINGS: The heart is at the upper limits of normal in size. Normal mediastinal contours. Normal pulmonary vascularity. Trace bilateral pleural effusions. Minimal atelectasis at the lung bases and in the peripheral lingula and right upper lobe. No consolidation or pneumothorax. No acute osseous abnormality. IMPRESSION: 1. Trace bilateral pleural effusions. Electronically Signed   By: Titus Dubin M.D.   On: 01/15/2019 15:21    Procedures .Critical Care Performed by: Kinnie Feil, PA-C Authorized by: Kinnie Feil, PA-C   Critical care provider statement:    Critical care time (minutes):  45   Critical care was necessary to treat or prevent imminent or life-threatening deterioration of the following conditions:  Renal failure (transfer to [place] for initiating  of emergnt dialysis)   Critical care was time spent personally by me on the following activities:  Discussions with consultants, evaluation of patient's response to treatment, examination of patient, ordering and performing treatments and interventions, ordering and review of laboratory studies, ordering and review of radiographic studies, pulse oximetry, re-evaluation of patient's condition, obtaining history from  patient or surrogate, review of old charts and development of treatment plan with patient or surrogate   I assumed direction of critical care for this patient from another provider in my specialty: no     (including critical care time)  Medications Ordered in ED Medications  furosemide (LASIX) injection 40 mg (has no administration in time range)  ondansetron (ZOFRAN) injection 4 mg (4 mg Intravenous Given 01/15/19 1415)  furosemide (LASIX) injection 60 mg (60 mg Intravenous Given 01/15/19 1518)     Initial Impression / Assessment and Plan / ED Course  I have reviewed the triage vital signs and the nursing notes.  Pertinent labs & imaging results that were available during my care of the patient were reviewed by me and considered in my medical decision making (see chart for details).  Clinical Course as of Jan 15 1555  Thu Jan 15, 2019  1531 I have spoken to nephrology Dr. Hollie Salk who agrees patient needs transfer to [facility] for initiation of dialysis.  She recommends 40 mg additional IV Lasix.  Discussed plan of care with patient.  Will consult hospitalist.   [CG]  [phone] Trace bilateral pleural effusions  DG Chest 2 View [CG]  1534 Similar to previous likely anemia of chronic disease  Hemoglobin(!): 9.3 [CG]  1534 Creatinine(!): 7.46 [CG]  1534 GFR, Est Non African American(!): 5 [CG]  1534 CO2(!): 20 [CG]  1534 Anion gap(!): 17 [CG]  1534 Potassium: 3.6 [CG]  1535 Protein(!): >=300 [CG]  1535 RBC / HPF(!): >50 [CG]  1535 WBC, UA: 6-10 [CG]  1535 She has no dysuria, urinary symptoms, suprapubic or CVA tenderness.  No fever.  Will send for culture.  Bacteria, UA(!): RARE [CG]    Clinical Course User Index [CG] Kinnie Feil, PA-C    78 year old presents with nausea and vomiting x1 this morning.  Also reports progressively worsening peripheral edema, shortness of breath, mild orthopnea, nausea, fatigue since hospital discharge 6/22.  Has been compliant with 80 mg Lasix  twice daily but increase in weight by 10 pounds.  No fever.  No cough.  No chest pain.   She is slightly fluid overloaded with peripheral edema. Diminished lung sounds to lower lobes, but no respiratory distress.  Hypertensive in setting of not taking her medicines today due to vomiting. 132# today, 121# at discharge.  I reviewed patient's EMR to obtain pertinent PMH, nephrology work-up.  Last hospital admission summary reviewed by me.  Patient admitted for AKI creatinine 6.3-6.6 during admission, previously 2-2.3.  Did not improve after IV fluids, sodium bicarbonate. Renal US unremarkable.  There was concern that her CKD was progressing.  She did not require HD during last admission.  Last echo 2017 showed mild LVH, EF 55 to 60%.  MDM and ddx includes again AKI on CKD. Her symptoms could also be from slowly progressing renal disease. She is HD stable in no respiratory distress so pulmonary edema and need for emergent HD less likely. Lower suspicion for HF exacerbation. She has no infectious symptoms,f ever. We will obtain labs, CXR, reassess.   Final Clinical Impressions(s) / ED Diagnoses   1545: ER work-up reviewed and  interpreted by me and radiologist.  Remarkable as above.  Worsening creatinine, GFR.  Anion gap 17, bicarb 20 likely secondary to renal failure.  CXR shows new small bilateral pleural effusions.  Stable hemoglobin.  UA with protein, RBCs and rare bacteria but she has no urinary symptoms, will defer antibiotics and sent for culture.  Work-up thus far suggests worsening renal function that may need urgent dialysis.  I spoken with nephrology who recommends giving a total of 100 mg IV Lasix and admission for initiation of HD.  Discussed with Dr Denton Brick hospitalist who has accepted patient. Shared with EDP. 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

## 2019-01-15 NOTE — Progress Notes (Addendum)
Paged Nephrology of patient's arrival to floor. Stated they will do nephrology consult tomorrow morning.

## 2019-01-15 NOTE — Progress Notes (Signed)
New Admission Note: ? Arrival Method: stretcher  Mental Orientation: A/O x 4 Telemetry: Box 14 NSR Assessment: Completed Skin: Refer to flowsheet IV:  Right Hand  Pain: 7/10 has arthritis  Tubes: Safety Measures: Safety Fall Prevention Plan discussed with patient. Admission: Completed [facility] [place] Orientation: Patient has been orientated to the room, unit and the staff. Family: Orders have been reviewed and are being implemented. Will continue to monitor the patient. Call light has been placed within reach and bed alarm has been activated.  ? [employer], [place]

## 2019-01-16 ENCOUNTER — Other Ambulatory Visit: Payer: Self-pay

## 2019-01-16 ENCOUNTER — Inpatient Hospital Stay (HOSPITAL_COMMUNITY): Payer: Medicare Other

## 2019-01-16 ENCOUNTER — Encounter (HOSPITAL_COMMUNITY): Payer: Self-pay | Admitting: Interventional Radiology

## 2019-01-16 DIAGNOSIS — Z992 Dependence on renal dialysis: Secondary | ICD-10-CM

## 2019-01-16 DIAGNOSIS — I10 Essential (primary) hypertension: Secondary | ICD-10-CM

## 2019-01-16 DIAGNOSIS — N186 End stage renal disease: Secondary | ICD-10-CM

## 2019-01-16 HISTORY — PX: IR US GUIDE VASC ACCESS RIGHT: IMG2390

## 2019-01-16 HISTORY — PX: IR FLUORO GUIDE CV LINE RIGHT: IMG2283

## 2019-01-16 LAB — BASIC METABOLIC PANEL
Anion gap: 13 (ref 5–15)
BUN: 103 mg/dL — ABNORMAL HIGH (ref 8–23)
CO2: 22 mmol/L (ref 22–32)
Calcium: 7.1 mg/dL — ABNORMAL LOW (ref 8.9–10.3)
Chloride: 104 mmol/L (ref 98–111)
Creatinine, Ser: 7.66 mg/dL — ABNORMAL HIGH (ref 0.44–1.00)
GFR calc Af Amer: 5 mL/min — ABNORMAL LOW (ref >60)
GFR calc non Af Amer: 5 mL/min — ABNORMAL LOW (ref >60)
Glucose, Bld: 114 mg/dL — ABNORMAL HIGH (ref 70–99)
Potassium: 4.2 mmol/L (ref 3.5–5.1)
Sodium: 139 mmol/L (ref 135–145)

## 2019-01-16 LAB — CBC
HCT: 26.4 % — ABNORMAL LOW (ref 36.0–46.0)
Hemoglobin: 8.5 g/dL — ABNORMAL LOW (ref 12.0–15.0)
MCH: 30 pg (ref 26.0–34.0)
MCHC: 32.2 g/dL (ref 30.0–36.0)
MCV: 93.3 fL (ref 80.0–100.0)
Platelets: 228 10*3/uL (ref 150–400)
RBC: 2.83 MIL/uL — ABNORMAL LOW (ref 3.87–5.11)
RDW: 15.2 % (ref 11.5–15.5)
WBC: 7.4 10*3/uL (ref 4.0–10.5)
nRBC: 0.3 % — ABNORMAL HIGH (ref 0.0–0.2)

## 2019-01-16 LAB — PROTIME-INR
INR: 1.1 (ref 0.8–1.2)
Prothrombin Time: 13.7 seconds (ref 11.4–15.2)

## 2019-01-16 MED ORDER — LOPERAMIDE HCL 2 MG PO CAPS
4.0000 mg | ORAL_CAPSULE | ORAL | Status: DC | PRN
  Administered 2019-01-18: 4 mg via ORAL
  Filled 2019-01-16 (×2): qty 2

## 2019-01-16 MED ORDER — CEFAZOLIN SODIUM-DEXTROSE 2-4 GM/100ML-% IV SOLN
INTRAVENOUS | Status: AC
  Administered 2019-01-16: 2000 mg
  Filled 2019-01-16: qty 100

## 2019-01-16 MED ORDER — MIDAZOLAM HCL 2 MG/2ML IJ SOLN
INTRAMUSCULAR | Status: AC
  Filled 2019-01-16: qty 2

## 2019-01-16 MED ORDER — CEFAZOLIN SODIUM-DEXTROSE 1-4 GM/50ML-% IV SOLN
1.0000 g | Freq: Once | INTRAVENOUS | Status: DC
  Filled 2019-01-16: qty 50

## 2019-01-16 MED ORDER — MIDAZOLAM HCL 2 MG/2ML IJ SOLN
INTRAMUSCULAR | Status: AC | PRN
  Administered 2019-01-16: 1 mg via INTRAVENOUS

## 2019-01-16 MED ORDER — LIDOCAINE HCL (PF) 1 % IJ SOLN: 5.0000 mL | INTRAMUSCULAR | Status: DC | PRN

## 2019-01-16 MED ORDER — HEPARIN SODIUM (PORCINE) 1000 UNIT/ML DIALYSIS
1000.0000 [IU] | INTRAMUSCULAR | Status: DC | PRN
  Administered 2019-01-17 – 2019-01-19 (×2): 3200 [IU] via INTRAVENOUS_CENTRAL

## 2019-01-16 MED ORDER — HEPARIN SODIUM (PORCINE) 1000 UNIT/ML IJ SOLN
INTRAMUSCULAR | Status: AC
  Filled 2019-01-16: qty 1

## 2019-01-16 MED ORDER — CHLORHEXIDINE GLUCONATE CLOTH 2 % EX PADS: 6.0000 | MEDICATED_PAD | Freq: Every day | CUTANEOUS | Status: DC

## 2019-01-16 MED ORDER — FENTANYL CITRATE (PF) 100 MCG/2ML IJ SOLN
INTRAMUSCULAR | Status: AC
  Filled 2019-01-16: qty 2

## 2019-01-16 MED ORDER — FENTANYL CITRATE (PF) 100 MCG/2ML IJ SOLN
INTRAMUSCULAR | Status: AC | PRN
  Administered 2019-01-16: 50 ug via INTRAVENOUS

## 2019-01-16 MED ORDER — LIDOCAINE-PRILOCAINE 2.5-2.5 % EX CREA: 1.0000 "application " | TOPICAL_CREAM | CUTANEOUS | Status: DC | PRN

## 2019-01-16 MED ORDER — CHLORHEXIDINE GLUCONATE CLOTH 2 % EX PADS
6.0000 | MEDICATED_PAD | Freq: Every day | CUTANEOUS | Status: DC
  Administered 2019-01-18: 6 via TOPICAL

## 2019-01-16 MED ORDER — HYDROCODONE-ACETAMINOPHEN 5-325 MG PO TABS
1.0000 | ORAL_TABLET | Freq: Four times a day (QID) | ORAL | Status: DC | PRN
  Administered 2019-01-16 – 2019-01-17 (×2): 2 via ORAL
  Administered 2019-01-17 – 2019-01-19 (×5): 1 via ORAL
  Filled 2019-01-16 (×2): qty 2
  Filled 2019-01-16 (×2): qty 1
  Filled 2019-01-16: qty 2
  Filled 2019-01-16 (×2): qty 1

## 2019-01-16 MED ORDER — ALTEPLASE 2 MG IJ SOLR: 2.0000 mg | Freq: Once | INTRAMUSCULAR | Status: DC | PRN

## 2019-01-16 MED ORDER — PENTAFLUOROPROP-TETRAFLUOROETH EX AERO: 1.0000 "application " | INHALATION_SPRAY | CUTANEOUS | Status: DC | PRN

## 2019-01-16 MED ORDER — LIDOCAINE HCL 1 % IJ SOLN
INTRAMUSCULAR | Status: AC | PRN
  Administered 2019-01-16: 10 mL

## 2019-01-16 MED ORDER — LIDOCAINE HCL 1 % IJ SOLN
INTRAMUSCULAR | Status: AC
  Filled 2019-01-16: qty 20

## 2019-01-16 MED ORDER — SODIUM CHLORIDE 0.9 % IV SOLN: 100.0000 mL | INTRAVENOUS | Status: DC | PRN

## 2019-01-16 NOTE — Progress Notes (Signed)
Renal Navigator received call from Dr. Bartholomew Boards to initiate OP HD referral for patient who lives in [place] and follows with Dr. Ilean China [facility]. Patient currently in IR and Renal Navigator unable to speak with patient at this time. Renal Navigator left message for [facility] office to see where the closest HD clinic is to patient's home would be where her current doctor could continue to follow her. Renal Navigator will continue to follow closely and make appropriate OP HD clinic referral.  Alphonzo Cruise, [place] Renal Navigator [phone]

## 2019-01-16 NOTE — Procedures (Signed)
Interventional Radiology Procedure Note  Procedure: Tunneled HD catheter  Complications: None  Estimated Blood Loss: < 10 mL  Findings: 19 cm tip to cuff length Palindrome catheter placed with tip in RA. OK to use.  Venetia Night. Kathlene Cote, M.D Pager:  [phone]

## 2019-01-16 NOTE — Progress Notes (Signed)
Attempted to call patients son for update, but no answer. Patient stated that she talked to her daughter this morning.

## 2019-01-16 NOTE — Consult Note (Signed)
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  She had also had outpatient labs which indicated that her creatinine had increased to 4.89 per charting and they recommended she come to the ER where her Cr was found to be 6.42. (For reference, her creatininewas2.91on5/6/20 andCrhad been 2.0 - 2.3with aGFR around 20 since 03/2018 before that time.) She had been on jardiance, lasix, and lisinopril which were all held.  I saw her last week and planned for HD initiation on 6/22 if no improvement.  Per chart review, she was discharged earlier this week for close outpatient follow-up.  She states that she initially felt ok but then felt fluid building up.  Had nausea yesterday and wasn't able to tolerate PO meds.  She is in agreement with starting dialysis after access placement.    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  Up/cr ratio 2342 mg/g in 08/2018 and UA with 23-30 RBC in 06/2018 and 03/2018 with TNTC RBC.  She denies ever taking any steroids or immunosuppression for her kidneys or any chemo agents. Work-up here notable for renal US with 9.9 cm kidneys bilaterally and no hydro. CK level 90.  Urinalysis with proteinuria and hematuria. She is also followed by Dr. Delton Coombes in hem/onc and had recent IV iron infusion on 6/15 and states last retacrit was on 6/1.     PMHx:   Past Medical History:  Diagnosis Date  . Anemia in stage 4 chronic  kidney disease ([place]) 08/18/2018  . Depression   . Essential hypertension, benign   . GERD (gastroesophageal reflux disease)   . Glomerulonephritis   . Gout   . Mixed hyperlipidemia   . Renal insufficiency     Past Surgical History:  Procedure Laterality Date  . APPENDECTOMY    . CATARACT EXTRACTION    . COLONOSCOPY N/A 12/28/2015   Procedure: COLONOSCOPY;  Surgeon: Rogene Houston, MD;  Location: [facility];  Service: Endoscopy;  Laterality: N/A;  815  . TUBAL LIGATION      Family Hx:  Family History  Problem Relation Age of Onset  . CAD Father   . Heart attack Father   . Diabetes Mellitus II Father   . Hypertension Father   . Lupus Brother     Social History:  reports that she has never smoked. She has never used smokeless tobacco. She reports that she does not drink alcohol or use drugs.  Allergies:  Allergies  Allergen Reactions  . Sulfa Antibiotics Other (See Comments)    Shut pt's kidneys down    Medications: Prior to Admission medications   Medication Sig Start Date End Date Taking? Authorizing Provider  calcitRIOL (ROCALTROL) 0.25 MCG capsule Take 1 capsule (0.25 mcg total) by mouth daily for 30 days. 01/13/19 02/12/19 Yes Shah, Pratik D, DO  calcium acetate (PHOSLO) 667 MG capsule Take 1 capsule (667 mg total) by mouth 3 (three) times daily with meals for 30 days. 01/12/19 02/11/19 Yes Shah, Pratik D, DO  carvedilol (COREG) 12.5 MG tablet Take 12.5 mg  by mouth 2 (two) times daily with a meal.  03/01/15  Yes [provider]  furosemide (LASIX) 80 MG tablet Take 1 tablet (80 mg total) by mouth 2 (two) times daily for 30 days. 01/12/19 02/11/19 Yes Shah, Pratik D, DO  hydrALAZINE (APRESOLINE) 50 MG tablet Take 50 mg by mouth 2 (two) times a day.  09/15/18  Yes [provider]  loperamide (IMODIUM) 2 MG capsule Take 1 capsule (2 mg total) by mouth as needed for diarrhea or loose stools. 01/12/19  Yes Manuella Ghazi, Pratik D, DO  ondansetron (ZOFRAN-ODT) 4 MG  disintegrating tablet Take 1 tablet (4 mg total) by mouth every 8 (eight) hours as needed for nausea or vomiting. 01/12/19  Yes Manuella Ghazi, Pratik D, DO  pantoprazole (PROTONIX) 40 MG tablet Take 40 mg by mouth daily.  12/09/17  Yes [provider]  predniSONE (DELTASONE) 10 MG tablet Take 3 tablets (30 mg total) by mouth daily with breakfast for 5 days, THEN 2.5 tablets (25 mg total) daily with breakfast for 5 days, THEN 2 tablets (20 mg total) daily with breakfast for 5 days, THEN 1.5 tablets (15 mg total) daily with breakfast for 5 days, THEN 1 tablet (10 mg total) daily with breakfast for 5 days, THEN 0.5 tablets (5 mg total) daily with breakfast for 5 days. 01/13/19 02/12/19 Yes Shah, Pratik D, DO  sodium bicarbonate 650 MG tablet Take 2 tablets (1,300 mg total) by mouth 3 (three) times daily for 30 days. 01/12/19 02/11/19 Yes Shah, Pratik D, DO  Vitamin D, Cholecalciferol, 50 MCG (2000 UT) CAPS Take 2,000 Units by mouth daily.    Yes [provider]  epoetin alfa (EPOGEN) 20000 UNIT/ML injection Inject 20,000 Units into the skin every 14 (fourteen) days.    [provider]    I have reviewed the patient's current medications.  Labs:  BMP Latest Ref Rng & Units 01/16/2019 01/15/2019 01/12/2019  Glucose 70 - 99 mg/dL 114(H) 106(H) 110(H)  BUN 8 - 23 mg/dL 103(H) 98(H) 79(H)  Creatinine 0.44 - 1.00 mg/dL 7.66(H) 7.46(H) 7.10(H)  Sodium 135 - 145 mmol/L 139 138 138  Potassium 3.5 - 5.1 mmol/L 4.2 3.6 4.3  Chloride 98 - 111 mmol/L 104 101 106  CO2 22 - 32 mmol/L 22 20(L) 23  Calcium 8.9 - 10.3 mg/dL 7.1(L) 7.3(L) 7.1(L)    Urinalysis    Component Value Date/Time   COLORURINE YELLOW 01/15/2019 1356   APPEARANCEUR CLEAR 01/15/2019 1356   LABSPEC 1.014 01/15/2019 1356   PHURINE 7.0 01/15/2019 1356   GLUCOSEU 50 (A) 01/15/2019 1356   HGBUR SMALL (A) 01/15/2019 1356   BILIRUBINUR NEGATIVE 01/15/2019 1356   KETONESUR NEGATIVE 01/15/2019 1356   PROTEINUR >=300 (A) 01/15/2019  1356   UROBILINOGEN 0.2 01/11/2014 1635   NITRITE NEGATIVE 01/15/2019 1356   LEUKOCYTESUR NEGATIVE 01/15/2019 1356     ROS:  Pertinent items noted in HPI and remainder of comprehensive ROS otherwise negative.  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  Fluid overload and nausea/uremic sx's - Hx AKI with progression.  CKD Secondary to fibrillary GN.  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  - Consulted IR for dialysis catheter placement today  - Plan for HD initiation once access obtained.  HD today if access and on 6/27  # HTN with CKD  -Coreg and hydralazine ordered and has received lasix  - For HD   # Metabolic acidosis  - 2/2 CKD  - For HD  - Stop bicarb   # Anemia with CKD  - Felt secondary to relative iron deficiency and CKD  - s/p IV iron on 6/15 and followed by hem/onc - Dr. Delton Coombes  - s/p aranesp 60 mcg once on 6/18.  Normally on procit with hem/onc  - Will redose aranesp once BP control improved   # 2HPT, Hyperphosphatemia  - 2/2 CKD.  PTH 155  - low phos diet including stopping dark sodas - Start phoslo with meals  # Osteoporosis - Note current hypocalcemia - Would avoid prolia with her GFR as concern for exacerbating hypocalcemia; would not resume as outpatient  # RA - Currently ordered for a pred taper    Claudia Desanctis 01/16/2019, 8:06 AM

## 2019-01-16 NOTE — H&P (Signed)
Chief Complaint: Patient was seen in consultation today for tunneled HD catheter placement.  Referring Physician(s): Dr. Harrie Jeans  Supervising Physician: Aletta Edouard  Patient Status: [facility] - In-pt  History of Present Illness: Felicia Williams is a 78 y.o. 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 Gomez Cleverly at [facility] who presented to [facility] ED on 01/07/19 with complaints of nausea, loss of appetite, diarrhea, fatigue, weakness and an acute increase in her creatinine. She was subsequently admitted for further evaluation and management. Nephrology was consulted and planned to to initiate HD on 6/22 if she did not improve, however she was discharged on 6/21 with close outpatient follow up planned. Unfortunately she began to experience nausea, high blood pressure and poor oral intake and she presented to [facility] ED on 01/15/19 - she was admitted and then transferred to [facility] to initiate HD. Request has been made to IR for tunneled HD catheter placement to initiate dialysis this weekend.  Patient reports that she feels ok, is disappointed that she will likely need dialysis for the rest of her life but is hopeful that she will not have any further kidney issues. She denies any pain today, was tolerating some PO intake but has been told to hold off on eating or drinking today for the procedure. She states understanding of requested procedure and wishes to proceed.   Past Medical History:  Diagnosis Date  . Anemia in stage 4 chronic kidney disease ([place]) 08/18/2018  . Depression   . Essential hypertension, benign   . GERD (gastroesophageal reflux disease)   . Glomerulonephritis   . Gout   . Mixed hyperlipidemia   . Renal insufficiency     Past Surgical History:  Procedure Laterality Date  . APPENDECTOMY    . CATARACT EXTRACTION    . COLONOSCOPY N/A 12/28/2015   Procedure:  COLONOSCOPY;  Surgeon: Rogene [place], MD;  Location: [facility] ENDO SUITE;  Service: Endoscopy;  Laterality: N/A;  815  . TUBAL LIGATION      Allergies: Sulfa antibiotics  Medications: Prior to Admission medications   Medication Sig Start Date End Date Taking? Authorizing Provider  calcitRIOL (ROCALTROL) 0.25 MCG capsule Take 1 capsule (0.25 mcg total) by mouth daily for 30 days. 01/13/19 02/12/19 Yes Shah, Pratik D, DO  calcium acetate (PHOSLO) 667 MG capsule Take 1 capsule (667 mg total) by mouth 3 (three) times daily with meals for 30 days. 01/12/19 02/11/19 Yes Shah, Pratik D, DO  carvedilol (COREG) 12.5 MG tablet Take 12.5 mg by mouth 2 (two) times daily with a meal.  03/01/15  Yes [provider]  furosemide (LASIX) 80 MG tablet Take 1 tablet (80 mg total) by mouth 2 (two) times daily for 30 days. 01/12/19 02/11/19 Yes Shah, Pratik D, DO  hydrALAZINE (APRESOLINE) 50 MG tablet Take 50 mg by mouth 2 (two) times a day.  09/15/18  Yes [provider]  loperamide (IMODIUM) 2 MG capsule Take 1 capsule (2 mg total) by mouth as needed for diarrhea or loose stools. 01/12/19  Yes Manuella Ghazi, Pratik D, DO  ondansetron (ZOFRAN-ODT) 4 MG disintegrating tablet Take 1 tablet (4 mg total) by mouth every 8 (eight) hours as needed for nausea or vomiting. 01/12/19  Yes Manuella Ghazi, Pratik D, DO  pantoprazole (PROTONIX) 40 MG tablet Take 40 mg by mouth daily.  12/09/17  Yes [provider]  predniSONE (DELTASONE) 10 MG tablet  Take 3 tablets (30 mg total) by mouth daily with breakfast for 5 days, THEN 2.5 tablets (25 mg total) daily with breakfast for 5 days, THEN 2 tablets (20 mg total) daily with breakfast for 5 days, THEN 1.5 tablets (15 mg total) daily with breakfast for 5 days, THEN 1 tablet (10 mg total) daily with breakfast for 5 days, THEN 0.5 tablets (5 mg total) daily with breakfast for 5 days. 01/13/19 02/12/19 Yes Shah, Pratik D, DO  sodium bicarbonate 650 MG tablet Take 2 tablets (1,300 mg total) by  mouth 3 (three) times daily for 30 days. 01/12/19 02/11/19 Yes Shah, Pratik D, DO  Vitamin D, Cholecalciferol, 50 MCG (2000 UT) CAPS Take 2,000 Units by mouth daily.    Yes [provider]  epoetin alfa (EPOGEN) 20000 UNIT/ML injection Inject 20,000 Units into the skin every 14 (fourteen) days.    [provider]     Family History  Problem Relation Age of Onset  . CAD Father   . Heart attack Father   . Diabetes Mellitus II Father   . Hypertension Father   . Lupus Brother     Social History   Socioeconomic History  . Marital status: Single    Spouse name: Not on file  . Number of children: Not on file  . Years of education: Not on file  . Highest education level: Not on file  Occupational History  . Not on file  Social Needs  . Financial resource strain: Not on file  . Food insecurity    Worry: Not on file    Inability: Not on file  . Transportation needs    Medical: Not on file    Non-medical: Not on file  Tobacco Use  . Smoking status: Never Smoker  . Smokeless tobacco: Never Used  Substance and Sexual Activity  . Alcohol use: No  . Drug use: No  . Sexual activity: Not Currently  Lifestyle  . Physical activity    Days per week: Not on file    Minutes per session: Not on file  . Stress: Not on file  Relationships  . Social Herbalist on phone: Not on file    Gets together: Not on file    Attends religious service: Not on file    Active member of club or organization: Not on file    Attends meetings of clubs or organizations: Not on file    Relationship status: Not on file  Other Topics Concern  . Not on file  Social History Narrative  . Not on file     Review of Systems: A 12 point ROS discussed and pertinent positives are indicated in the HPI above.  All other systems are negative.  Review of Systems  Constitutional: Positive for fatigue. Negative for chills and fever.  Respiratory: Negative for cough and shortness of breath.    Cardiovascular: Negative for chest pain.  Gastrointestinal: Negative for abdominal pain, diarrhea, nausea and vomiting.  Musculoskeletal: Positive for arthralgias (chronic).  Skin: Negative for rash.  Neurological: Negative for dizziness and syncope.  Psychiatric/Behavioral: Negative for confusion.    Vital Signs: BP (!) 184/66 (BP Location: Left Arm)   Pulse 69   Temp 98.3 F (36.8 C) (Oral)   Resp 18   Ht 4\' 11"  (1.499 m)   Wt 132 lb 11.2 oz (60.2 kg)   SpO2 98%   BMI 26.80 kg/m   Physical Exam Vitals signs and nursing note reviewed.  Constitutional:  General: She is not in acute distress. HENT:     Head: Normocephalic.  Cardiovascular:     Rate and Rhythm: Normal rate and regular rhythm.  Pulmonary:     Effort: Pulmonary effort is normal.     Breath sounds: Normal breath sounds.  Abdominal:     General: There is no distension.     Palpations: Abdomen is soft.     Tenderness: There is no abdominal tenderness.  Skin:    General: Skin is warm and dry.  Neurological:     Mental Status: She is alert and oriented to person, place, and time.  Psychiatric:        Mood and Affect: Mood normal.        Behavior: Behavior normal.        Thought Content: Thought content normal.        Judgment: Judgment normal.      MD Evaluation Airway: WNL Heart: WNL Abdomen: WNL Chest/ Lungs: WNL ASA  Classification: 3 Mallampati/Airway Score: Two   Imaging: Dg Chest 2 View  Result Date: 01/15/2019 CLINICAL DATA:  Shortness of breath with worsening peripheral edema. EXAM: CHEST - 2 VIEW COMPARISON:  Chest x-ray dated July 18, 2016. FINDINGS: The heart is at the upper limits of normal in size. Normal mediastinal contours. Normal pulmonary vascularity. Trace bilateral pleural effusions. Minimal atelectasis at the lung bases and in the peripheral lingula and right upper lobe. No consolidation or pneumothorax. No acute osseous abnormality. IMPRESSION: 1. Trace bilateral  pleural effusions. Electronically Signed   By: Titus Dubin M.D.   On: 01/15/2019 15:21   US Renal  Result Date: 01/07/2019 CLINICAL DATA:  Acute renal failure EXAM: RENAL / URINARY TRACT ULTRASOUND COMPLETE COMPARISON:  None. FINDINGS: Right Kidney: Renal measurements: 9.9 x 4.6 x 4.9 cm = volume: 115.3 mL. Increased cortical echogenicity Left Kidney: Renal measurements: 9.9 x 4.9 x 5.0 cm = volume: 127.5 mL. Increased cortical echogenicity Bladder: Appears normal for degree of bladder distention. IMPRESSION: Increased cortical echogenicity bilaterally consistent with medical renal disease. No hydronephrosis or other abnormality. Electronically Signed   By: Dorise Bullion III M.D   On: 01/07/2019 16:27    Labs:  CBC: Recent Labs    01/11/19 0619 01/12/19 0503 01/15/19 1423 01/16/19 0312  WBC 6.2 10.0 9.8 7.4  HGB 8.3* 8.3* 9.3* 8.5*  HCT 26.1* 26.3* 29.5* 26.4*  PLT 194 216 251 228    COAGS: No results for input(s): INR, APTT in the last 8760 hours.  BMP: Recent Labs    01/11/19 0619 01/12/19 0503 01/15/19 1423 01/16/19 0312  NA 133* 138 138 139  K 4.1 4.3 3.6 4.2  CL 104 106 101 104  CO2 17* 23 20* 22  GLUCOSE 177* 110* 106* 114*  BUN 70* 79* 98* 103*  CALCIUM 6.8* 7.1* 7.3* 7.1*  CREATININE 6.64* 7.10* 7.46* 7.66*  GFRNONAA 5* 5* 5* 5*  GFRAA 6* 6* 6* 5*    LIVER FUNCTION TESTS: Recent Labs    08/26/18 1316 01/07/19 1342 01/08/19 0546  01/10/19 0632 01/11/19 0619 01/12/19 0503 01/15/19 1423  BILITOT 0.4 0.6 0.7  --   --   --   --  0.5  Williams 25 22 19   --   --   --   --  25  ALT 19 16 14   --   --   --   --  22  ALKPHOS 43 56 47  --   --   --   --  50  PROT 6.1* 5.7* 4.8*  --   --   --   --  5.2*  ALBUMIN 3.1* 2.4* 2.1*   < > 2.1* 2.0* 2.1* 2.4*   < > = values in this interval not displayed.    TUMOR MARKERS: No results for input(s): AFPTM, CEA, CA199, CHROMGRNA in the last 8760 hours.  Assessment and Plan:  78 y/o F with history of CKD stage IV  followed regularly by [facility] who presented to [facility] ED twice in the past week with complaints of nausea, poor PO intake, HTN and diarrhea. Nephrology has been consulted here at [facility] and they plan to initiate dialysis. Request has been made to IR for placement of tunneled HD catheter.  Last PO intake 0730 today, she is not currently on any blood thinning medications. Afebrile, WBC 7.4, hgb 8.5 (baseline), plt 228, creatinine 7.66, INR 1.1.  Risks and benefits discussed with the patient including, but not limited to bleeding, infection, vascular injury, pneumothorax which may require chest tube placement, air embolism or even death.  All of the patient's questions were answered, patient is agreeable to proceed.  Consent signed and in chart.  Thank you for this interesting consult.  I greatly enjoyed meeting Felicia Williams and look forward to participating in their care.  A copy of this report was sent to the requesting provider on this date.  Electronically Signed: Joaquim Nam, PA-C 01/16/2019, 10:27 AM   I spent a total of 20 Minutes  in face to face in clinical consultation, greater than 50% of which was counseling/coordinating care for HD catheter placement.

## 2019-01-16 NOTE — Progress Notes (Signed)
PROGRESS NOTE    Felicia Williams  GYI:[id number] DOB: 1940/09/03 DOA: 01/15/2019 PCP: Octavio Graves, DO   Brief Narrative: Felicia Williams is a 78 y.o. female with medical history significant for glomerulonephritis with progressive renal failure, hypertension, psoriasis, hypertension, anxiety and depression. Patient presented secondary to worsening leg swelling and shortness of breath, found to have worsening renal failure and has now started HD.   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 HD to start today -Nephrology recommendations -Outpatient HD site needed prior to discharge  Prolonged QTc 513 ms on admission. Currently resolved on telemetry.  Essential hypertension Hypertensive. On Coreg, hydralazine as an outpatient -Continue Coreg and Hydralazine -Will add Hydralazine prn and defer to nephrology for recommendations on BP management since patient is on HD  Rheumatoid arthritis -Continue prednisone   DVT prophylaxis: SCDs Code Status:   Code Status: Full Code Family Communication: None Disposition Plan: Discharge once able to start outpatient HD   Consultants:   Nephrology  Procedures:   None  Antimicrobials:  None    Subjective: Feels better than yesterday. Awaiting HD today.  Objective: Vitals:   01/15/19 1900 01/15/19 2111 01/16/19 0446 01/16/19 0855  BP: (!) 175/59 (!) 166/68 (!) 194/79 (!) 184/66  Pulse: 63 64 72 69  Resp: 16 16 16 18   Temp:  98.4 F (36.9 C) 98.8 F (37.1 C) 98.3 F (36.8 C)  TempSrc:  Oral Oral Oral  SpO2: 97% 99% 97% 98%  Weight:   60.2 kg   Height:        Intake/Output Summary (Last 24 hours) at 01/16/2019 1201 Last data filed at 01/16/2019 1058 Gross per 24 hour  Intake 300 ml  Output 600 ml  Net -300 ml   Filed Weights   01/15/19 1336 01/15/19 1406 01/16/19 0446  Weight: 59 kg 60.2 kg 60.2 kg    Examination:  General exam: Appears calm and  comfortable Respiratory system: Clear to auscultation. Respiratory effort normal. Cardiovascular system: S1 & S2 heard, RRR. No murmurs, rubs, gallops or clicks. Gastrointestinal system: Abdomen is nondistended, soft and nontender. No organomegaly or masses felt. Normal bowel sounds heard. Central nervous system: Alert and oriented. No focal neurological deficits. Extremities: LE edema. No calf tenderness Skin: No cyanosis. No rashes Psychiatry: Judgement and insight appear normal. Mood & affect appropriate.     Data Reviewed: I have personally reviewed following labs and imaging studies  CBC: Recent Labs  Lab 01/10/19 0632 01/11/19 0619 01/12/19 0503 01/15/19 1423 01/16/19 0312  WBC 5.7 6.2 10.0 9.8 7.4  NEUTROABS  --   --   --  7.3  --   HGB 8.8* 8.3* 8.3* 9.3* 8.5*  HCT 28.3* 26.1* 26.3* 29.5* 26.4*  MCV 95.0 92.6 93.6 95.8 93.3  PLT 177 194 216 251 350   Basic Metabolic Panel: Recent Labs  Lab 01/10/19 0632 01/11/19 0619 01/12/19 0503 01/15/19 1423 01/16/19 0312  NA 139 133* 138 138 139  K 4.5 4.1 4.3 3.6 4.2  CL 114* 104 106 101 104  CO2 15* 17* 23 20* 22  GLUCOSE 85 177* 110* 106* 114*  BUN 68* 70* 79* 98* 103*  CREATININE 6.21* 6.64* 7.10* 7.46* 7.66*  CALCIUM 6.9*  7.1* 6.8* 7.1* 7.3* 7.1*  MG  --   --   --  2.3  --   PHOS 7.0* 6.5* 6.0*  --   --    GFR: Estimated Creatinine Clearance:  4.8 mL/min (A) (by C-G formula based on SCr of 7.66 mg/dL (H)). Liver Function Tests: Recent Labs  Lab 01/10/19 4481 01/11/19 0619 01/12/19 0503 01/15/19 1423  AST  --   --   --  25  ALT  --   --   --  22  ALKPHOS  --   --   --  50  BILITOT  --   --   --  0.5  PROT  --   --   --  5.2*  ALBUMIN 2.1* 2.0* 2.1* 2.4*   No results for input(s): LIPASE, AMYLASE in the last 168 hours. No results for input(s): AMMONIA in the last 168 hours. Coagulation Profile: Recent Labs  Lab 01/16/19 1028  INR 1.1   Cardiac Enzymes: Recent Labs  Lab 01/15/19 1423  CKTOTAL  100   BNP (last 3 results) No results for input(s): PROBNP in the last 8760 hours. HbA1C: No results for input(s): HGBA1C in the last 72 hours. CBG: Recent Labs  Lab 01/11/19 1200 01/11/19 1645 01/11/19 2121 01/12/19 0746  GLUCAP 165* 143* 141* 92   Lipid Profile: No results for input(s): CHOL, HDL, LDLCALC, TRIG, CHOLHDL, LDLDIRECT in the last 72 hours. Thyroid Function Tests: No results for input(s): TSH, T4TOTAL, FREET4, T3FREE, THYROIDAB in the last 72 hours. Anemia Panel: No results for input(s): VITAMINB12, FOLATE, FERRITIN, TIBC, IRON, RETICCTPCT in the last 72 hours. Sepsis Labs: No results for input(s): PROCALCITON, LATICACIDVEN in the last 168 hours.  Recent Results (from the past 240 hour(s))  Novel Coronavirus,NAA,(SEND-OUT TO REF LAB - TAT 24-48 hrs); Hosp Order     Status: None   Collection Time: 01/07/19  5:46 PM   Specimen: Nasopharyngeal Swab; Respiratory  Result Value Ref Range Status   SARS-CoV-2, NAA NOT DETECTED NOT DETECTED Final    Comment: (NOTE) This test was developed and its performance characteristics determined by [employer]. This test has not been FDA cleared or approved. This test has been authorized by FDA under an Emergency Use Authorization (EUA). This test is only authorized for the duration of time the declaration that circumstances exist justifying the authorization of the emergency use of in vitro diagnostic tests for detection of SARS-CoV-2 virus and/or diagnosis of COVID-19 infection under section 564(b)(1) of the Act, 21 U.S.C. 856DJS-9(F)(0), unless the authorization is terminated or revoked sooner. When diagnostic testing is negative, the possibility of a false negative result should be considered in the context of a patient's recent exposures and the presence of clinical signs and symptoms consistent with COVID-19. An individual without symptoms of COVID-19 and who is not shedding SARS-CoV-2 virus would expect to have  a negative (not detected) result in this assay. Performed  At: [facility] [address] [place], [place] [id number] Rush Farmer MD OY:[id number]    [name]  Final    Comment: Performed at [facility], [address], [place], [place] [address]  SARS Coronavirus 2 ([place] - Performed in [facility] hospital lab), Hosp Order     Status: None   Collection Time: 01/15/19  5:28 PM   Specimen: Nasopharyngeal Swab  Result Value Ref Range Status   SARS Coronavirus 2 NEGATIVE NEGATIVE Final    Comment: (NOTE) If result is NEGATIVE SARS-CoV-2 target nucleic acids are NOT DETECTED. The SARS-CoV-2 RNA is generally detectable in upper and lower  respiratory specimens during the acute phase of infection. The lowest  concentration of SARS-CoV-2 viral copies this assay can detect is 250  copies /  mL. A negative result does not preclude SARS-CoV-2 infection  and should not be used as the sole basis for treatment or other  patient management decisions.  A negative result may occur with  improper specimen collection / handling, submission of specimen other  than nasopharyngeal swab, presence of viral mutation(s) within the  areas targeted by this assay, and inadequate number of viral copies  (<250 copies / mL). A negative result must be combined with clinical  observations, patient history, and epidemiological information. If result is POSITIVE SARS-CoV-2 target nucleic acids are DETECTED. The SARS-CoV-2 RNA is generally detectable in upper and lower  respiratory specimens dur ing the acute phase of infection.  Positive  results are indicative of active infection with SARS-CoV-2.  Clinical  correlation with patient history and other diagnostic information is  necessary to determine patient infection status.  Positive results do  not rule out bacterial infection or co-infection with other viruses. If result is PRESUMPTIVE POSTIVE SARS-CoV-2 nucleic acids MAY  BE PRESENT.   A presumptive positive result was obtained on the submitted specimen  and confirmed on repeat testing.  While 2019 novel coronavirus  (SARS-CoV-2) nucleic acids may be present in the submitted sample  additional confirmatory testing may be necessary for epidemiological  and / or clinical management purposes  to differentiate between  SARS-CoV-2 and other Sarbecovirus currently known to infect humans.  If clinically indicated additional testing with an alternate test  methodology [phone]) is advised. The SARS-CoV-2 RNA is generally  detectable in upper and lower respiratory sp ecimens during the acute  phase of infection. The expected result is Negative. Fact Sheet for Patients:  StrictlyIdeas.no Fact Sheet for Healthcare Providers: BankingDealers.co.za This test is not yet approved or cleared by the [place] FDA and has been authorized for detection and/or diagnosis of SARS-CoV-2 by FDA under an Emergency Use Authorization (EUA).  This EUA will remain in effect (meaning this test can be used) for the duration of the COVID-19 declaration under Section 564(b)(1) of the Act, 21 U.S.C. section 360bbb-3(b)(1), unless the authorization is terminated or revoked sooner. Performed at [facility], [address], [place], [place] [address]          Radiology Studies: Dg Chest 2 View  Result Date: 01/15/2019 CLINICAL DATA:  Shortness of breath with worsening peripheral edema. EXAM: CHEST - 2 VIEW COMPARISON:  Chest x-ray dated July 18, 2016. FINDINGS: The heart is at the upper limits of normal in size. Normal mediastinal contours. Normal pulmonary vascularity. Trace bilateral pleural effusions. Minimal atelectasis at the lung bases and in the peripheral lingula and right upper lobe. No consolidation or pneumothorax. No acute osseous abnormality. IMPRESSION: 1. Trace bilateral pleural effusions. Electronically Signed   By:  Titus Dubin M.D.   On: 01/15/2019 15:21        Scheduled Meds: . calcitRIOL  0.25 mcg Oral Daily  . calcium acetate  667 mg Oral TID WC  . carvedilol  12.5 mg Oral BID WC  . Chlorhexidine Gluconate Cloth  6 each Topical Q0600  . hydrALAZINE  50 mg Oral BID  . pantoprazole  40 mg Oral Daily  . predniSONE  30 mg Oral Q breakfast   Followed by  . [START ON 01/18/2019] predniSONE  25 mg Oral Q breakfast   Followed by  . [START ON 01/23/2019] predniSONE  20 mg Oral Q breakfast   Followed by  . [START ON 01/28/2019] predniSONE  15 mg Oral Q breakfast   Followed by  . [  START ON 02/02/2019] predniSONE  10 mg Oral Q breakfast   Followed by  . [START ON 02/07/2019] predniSONE  5 mg Oral Q breakfast   Continuous Infusions:   LOS: 1 day     Cordelia Poche, MD [facility] 01/16/2019, 12:01 PM  If 7PM-7AM, please contact night-coverage [email or web address]

## 2019-01-17 DIAGNOSIS — N179 Acute kidney failure, unspecified: Secondary | ICD-10-CM

## 2019-01-17 LAB — RENAL FUNCTION PANEL
Albumin: 1.9 g/dL — ABNORMAL LOW (ref 3.5–5.0)
Anion gap: 14 (ref 5–15)
BUN: 72 mg/dL — ABNORMAL HIGH (ref 8–23)
CO2: 23 mmol/L (ref 22–32)
Calcium: 7.2 mg/dL — ABNORMAL LOW (ref 8.9–10.3)
Chloride: 102 mmol/L (ref 98–111)
Creatinine, Ser: 5.94 mg/dL — ABNORMAL HIGH (ref 0.44–1.00)
GFR calc Af Amer: 7 mL/min — ABNORMAL LOW (ref >60)
GFR calc non Af Amer: 6 mL/min — ABNORMAL LOW (ref >60)
Glucose, Bld: 127 mg/dL — ABNORMAL HIGH (ref 70–99)
Phosphorus: 6.5 mg/dL — ABNORMAL HIGH (ref 2.5–4.6)
Potassium: 3.8 mmol/L (ref 3.5–5.1)
Sodium: 139 mmol/L (ref 135–145)

## 2019-01-17 LAB — NOVEL CORONAVIRUS, NAA (HOSP ORDER, SEND-OUT TO REF LAB; TAT 18-24 HRS): SARS-CoV-2, NAA: NOT DETECTED

## 2019-01-17 LAB — HEPATITIS B CORE ANTIBODY, TOTAL: Hep B Core Total Ab: NEGATIVE

## 2019-01-17 LAB — HEPATITIS B SURFACE ANTIBODY,QUALITATIVE: Hep B S Ab: NONREACTIVE

## 2019-01-17 LAB — HEPATITIS B SURFACE ANTIGEN: Hepatitis B Surface Ag: NEGATIVE

## 2019-01-17 MED ORDER — NEPRO/CARBSTEADY PO LIQD
237.0000 mL | Freq: Three times a day (TID) | ORAL | Status: DC
  Administered 2019-01-20 (×2): 237 mL via ORAL
  Filled 2019-01-17 (×11): qty 237

## 2019-01-17 MED ORDER — CALCIUM CARBONATE ANTACID 500 MG PO CHEW
200.0000 mg | CHEWABLE_TABLET | Freq: Two times a day (BID) | ORAL | Status: DC
  Administered 2019-01-17 – 2019-01-20 (×8): 200 mg via ORAL
  Filled 2019-01-17 (×8): qty 1

## 2019-01-17 MED ORDER — HEPARIN SODIUM (PORCINE) 1000 UNIT/ML IJ SOLN
INTRAMUSCULAR | Status: AC
  Administered 2019-01-17: 3200 [IU] via INTRAVENOUS_CENTRAL
  Filled 2019-01-17: qty 4

## 2019-01-17 MED ORDER — DARBEPOETIN ALFA 60 MCG/0.3ML IJ SOSY
60.0000 ug | PREFILLED_SYRINGE | Freq: Once | INTRAMUSCULAR | Status: AC
  Administered 2019-01-17: 60 ug via INTRAVENOUS
  Filled 2019-01-17: qty 0.3

## 2019-01-17 NOTE — Progress Notes (Signed)
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 Al Corpus Anemia of renal disease HTN Osteoporosis Rheumatoid arthritis  Admit 6/25 nausea anorexia diarrhea fatigue-this is after being seen 6/17 [facility] ED and following up with her nephrologist In ED ^^ blood pressure-Rx hydralazine creatinine 6.4 up from baseline 2.0  First HD 6/26  DVT Lovenox Code Status: Full Communication: None today Disposition Plan: Inpatient pending resolution and clip with dialysis center outpatient-lives with family expect discharge home once clip is done probably early next week   Verlon Au, MD  [facility] Via [email or web address] 7PM-7AM contact night coverage as above 01/17/2019, 3:06 PM  LOS: 2 days   Consultants:  Nephrology  Procedures:  HD,  Antimicrobials:  None  Interval history/Subjective: Nausea this a.m. however managed to eat lunch Dizzy but first dialysis was yesterday No chest pain No fever States some tightening in her throat-no dysphagia associated-no vomit  Objective:  Vitals:  Vitals:   01/17/19 0507 01/17/19 0950  BP: (!) 182/80 (!) 161/69  Pulse: 67 73  Resp: 16 18  Temp: 98 F (36.7 C)  98 F (36.7 C)  SpO2: 98% 96%    Exam:  Pleasant younger than stated age No icterus no pallor Moderate dentition Tonsils/back of throat not red No submandibular lymphadenopathy Chest clear S1-S2 no murmur-telemetry shows normal sinus rhythm-QTC per my read 470s Neurologically intact moving all 4 limbs equally   I have personally reviewed the following:   DATA Last iron stores 6/4-?  Repeat 4 weeks BUN/creatinine 72/5.9 albumin 1.9 calcium 7.2  Scheduled Meds: . calcitRIOL  0.25 mcg Oral Daily  . calcium acetate  667 mg Oral TID WC  . calcium carbonate  200 mg of elemental calcium Oral BID WC  . carvedilol  12.5 mg Oral BID WC  . Chlorhexidine Gluconate Cloth  6 each Topical Q0600  . Chlorhexidine Gluconate Cloth  6 each Topical Q0600  . darbepoetin (ARANESP) injection - DIALYSIS  60 mcg Intravenous Once  . hydrALAZINE  50 mg Oral BID  . pantoprazole  40 mg Oral Daily  . [START ON 01/18/2019] predniSONE  25 mg Oral Q breakfast   Followed by  . [START ON 01/23/2019] predniSONE  20 mg Oral Q breakfast   Followed by  . [START ON 01/28/2019] predniSONE  15 mg Oral Q breakfast   Followed by  . [START ON 02/02/2019] predniSONE  10 mg Oral Q breakfast   Followed by  . [START ON 02/07/2019] predniSONE  5 mg Oral Q breakfast   Continuous Infusions: . sodium chloride    . sodium chloride    .  ceFAZolin (ANCEF) IV      Active Problems:   Renal failure   LOS: 2 days

## 2019-01-17 NOTE — Progress Notes (Signed)
Patient's son called and updated the status and answered all the questions

## 2019-01-17 NOTE — Progress Notes (Signed)
[facility] Progress Note  Name: Felicia Williams MRN: [id number] DOB: 08-Jan-1941  Chief Complaint:  Fluid build up   Subjective:  Pt had HD on 6/26 with 0.5 kg UF after tunneled catheter placed with IR.  She had 300 mL UOP charted over 6/26.  Still feels swollen.  A little dizziness.   Review of systems:  Denies shortness of breath Denies n/v No cp   ---------------------- Background:  Felicia Williams a 78 y.o.femalewith a history of CKD stage IV followed by [place] who first presented to [facility] on 6/17 with nausea, diarrhea,and fatigue/weakness.  She had also had outpatient labs which indicated that her creatinine had increased to 4.89 per charting and they recommended she come to the ER where her Cr was found to be 6.42. (For reference, her creatininewas2.91on5/6/20 andCrhad been 2.0 - 2.3with aGFR around 20 since 03/2018 before that time.) She had been on jardiance, lasix, and lisinopril which were all held.  I saw her last week and planned for HD initiation on 6/22 if no improvement.  Per chart review, she was discharged earlier this week for close outpatient follow-up.  She states that she initially felt ok but then felt fluid building up.  Had nausea yesterday and wasn't able to tolerate PO meds.  She is in agreement with starting dialysis after access placement.    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 Up/cr ratio 2342 mg/g in 08/2018 and UA with 23-30 RBC in 06/2018 and 03/2018 with TNTC RBC. She denies ever taking any steroids or immunosuppression for her kidneys or any chemo agents. Work-up here notable for renal US with 9.9 cm kidneys bilaterally and no hydro. CK level 90. Urinalysis with proteinuria  and hematuria. She is also followed by Dr. Delton Coombes in hem/onc and had recent IV iron infusion on 6/15 and states last retacrit was on 6/1.   Intake/Output Summary (Last 24 hours) at 01/17/2019 1008 Last data filed at 01/16/2019 2200 Gross per 24 hour  Intake 580 ml  Output 813 ml  Net -233 ml    Vitals:  Vitals:   01/16/19 1901 01/16/19 2219 01/17/19 0507 01/17/19 0950  BP: (!) 186/80 (!) 153/58 (!) 182/80 (!) 161/69  Pulse: 73 74 67 73  Resp: 18 18 16 18   Temp: 98.2 F (36.8 C) 98.7 F (37.1 C) 98 F (36.7 C)   TempSrc: Oral Oral Oral   SpO2: 97% 97% 98% 96%  Weight:      Height:         Physical Exam:  General adult female in bed in no acute distress HEENT normocephalic atraumatic extraocular movements intact sclera anicteric Neck supple trachea midline Lungs clear to auscultation bilaterally normal work of breathing at rest  Heart regular rate and rhythm no rubs or gallops appreciated Abdomen soft nontender nondistended Extremities 1+ edema  Psych normal mood and affect Neuro - alert and oriented x 3; conversant Access RIJ tunneled catheter  Medications reviewed   Labs:  BMP Latest Ref Rng & Units 01/16/2019 01/15/2019 01/12/2019  Glucose 70 - 99 mg/dL 114(H) 106(H) 110(H)  BUN 8 - 23 mg/dL 103(H) 98(H) 79(H)  Creatinine 0.44 - 1.00 mg/dL 7.66(H) 7.46(H) 7.10(H)  Sodium 135 - 145 mmol/L 139 138 138  Potassium 3.5 - 5.1 mmol/L 4.2 3.6 4.3  Chloride 98 -  111 mmol/L 104 101 106  CO2 22 - 32 mmol/L 22 20(L) 23  Calcium 8.9 - 10.3 mg/dL 7.1(L) 7.3(L) 7.1(L)     Assessment/Plan:   # 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  Fluid overload and nausea/uremic sx's - Hx AKI with progression.  CKD Secondary to fibrillary GN.  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  First HD on 6/26 after tunneled catheter - HD today - gentle UF - then will  transition to MWF schedule while inpatient for now - We have initiated search for an outpatient HD unit - She followed with [facility]. Al Corpus [facility] previously who she states does not go to [place].  Would like to eventually do dialysis at home if possible and in that event may be able to transfer back to her at some point.  She is calling Dr. Johnnette Gourd office to keep them in the loop - Daily renal profile - await today's labs   # HTN with CKD  - Continue current regimen for now - Continue HD and follow for the need for titration of regimen   # Metabolic acidosis  - 2/2 CKD  - Continue with HD  # Anemia with CKD - Felt secondary to relative iron deficiency and CKD  - s/p IV iron on 6/15 and followed by hem/onc - Dr. Delton Coombes.  s/p aranesp 60 mcg once on 6/18.  Normally on procit with hem/onc  - Aranesp 60 mcg once on 6/27  # 2HPT, Hyperphosphatemia  - 2/2 CKD.  PTH 155  - low phos diet including stopping dark sodas - phoslo with meals  - Trend phos and anticipate some improvement with HD  # Osteoporosis - Note current hypocalcemia - Would avoid prolia with her GFR as concern for exacerbating hypocalcemia; would not resume as outpatient  # RA - Noted she is currently ordered for a pred taper    Claudia Desanctis, MD 01/17/2019 10:08 AM

## 2019-01-18 LAB — RENAL FUNCTION PANEL
Albumin: 2.1 g/dL — ABNORMAL LOW (ref 3.5–5.0)
Anion gap: 14 (ref 5–15)
BUN: 39 mg/dL — ABNORMAL HIGH (ref 8–23)
CO2: 24 mmol/L (ref 22–32)
Calcium: 7.2 mg/dL — ABNORMAL LOW (ref 8.9–10.3)
Chloride: 99 mmol/L (ref 98–111)
Creatinine, Ser: 4.13 mg/dL — ABNORMAL HIGH (ref 0.44–1.00)
GFR calc Af Amer: 11 mL/min — ABNORMAL LOW (ref >60)
GFR calc non Af Amer: 10 mL/min — ABNORMAL LOW (ref >60)
Glucose, Bld: 90 mg/dL (ref 70–99)
Phosphorus: 4.3 mg/dL (ref 2.5–4.6)
Potassium: 3.8 mmol/L (ref 3.5–5.1)
Sodium: 137 mmol/L (ref 135–145)

## 2019-01-18 MED ORDER — CHLORHEXIDINE GLUCONATE CLOTH 2 % EX PADS
6.0000 | MEDICATED_PAD | Freq: Every day | CUTANEOUS | Status: DC
  Administered 2019-01-19: 6 via TOPICAL

## 2019-01-18 MED ORDER — NIFEDIPINE ER OSMOTIC RELEASE 30 MG PO TB24
30.0000 mg | ORAL_TABLET | Freq: Every day | ORAL | Status: DC
  Administered 2019-01-18 – 2019-01-20 (×3): 30 mg via ORAL
  Filled 2019-01-18 (×3): qty 1

## 2019-01-18 NOTE — Progress Notes (Signed)
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 Al Corpus Anemia of renal disease HTN Osteoporosis Rheumatoid arthritis  Admit 6/25 nausea anorexia diarrhea fatigue-this is after being seen 6/17 [facility] ED and following up with her nephrologist In ED ^^ blood pressure-Rx hydralazine creatinine 6.4 up from baseline 2.0  First HD 6/26  DVT Lovenox Code Status: Full Communication: None today Disposition Plan: Inpatient pending resolution and clip with dialysis, and as per nephro re: challenging EDW  Sacha Radloff, MD  [facility] Via amion app [place] [email or web address] 7PM-7AM contact night coverage as above 01/18/2019, 1:45 PM  LOS: 3 days   Consultants:  Nephrology  Procedures:  HD,  Antimicrobials:  None  Interval history/Subjective:  Awake coherent in nad No new issues--dizziness still there but improved Willing to walk the unit  Objective:  Vitals:  Vitals:   01/18/19 0347 01/18/19 0849  BP: (!) 186/85 (!) 155/79  Pulse: 74 79  Resp: 16 18  Temp: 98.4 F (36.9 C) 98.1 F  (36.7 C)  SpO2: 93%     Exam:  Pleasant  No icterus no pallor Moderate dentition Tonsils/back of throat not red No submandibular lymphadenopathy Chest clear S1-S2 no murmur Neurologically intact moving all 4 limbs equally   I have personally reviewed the following:   DATA Last iron stores 6/4-?  Repeat 4 weeks BUN/creatinine 72/5.9 -->39/4.13 Phos 6.5-->4.3 albumin 2.1 calcium 7.2  Scheduled Meds: . calcitRIOL  0.25 mcg Oral Daily  . calcium acetate  667 mg Oral TID WC  . calcium carbonate  200 mg of elemental calcium Oral BID WC  . carvedilol  12.5 mg Oral BID WC  . Chlorhexidine Gluconate Cloth  6 each Topical Q0600  . Chlorhexidine Gluconate Cloth  6 each Topical Q0600  . feeding supplement (NEPRO CARB STEADY)  237 mL Oral TID WC  . hydrALAZINE  50 mg Oral BID  . NIFEdipine  30 mg Oral Daily  . pantoprazole  40 mg Oral Daily  . predniSONE  25 mg Oral Q breakfast   Followed by  . [START ON 01/23/2019] predniSONE  20 mg Oral Q breakfast   Followed by  . [START ON 01/28/2019] predniSONE  15 mg Oral Q breakfast   Followed by  . [START ON 02/02/2019] predniSONE  10 mg Oral Q breakfast   Followed by  . [START ON 02/07/2019] predniSONE  5 mg Oral Q breakfast   Continuous Infusions: . sodium chloride    . sodium chloride    .  ceFAZolin (ANCEF) IV      Active Problems:   Renal failure   LOS: 3 days

## 2019-01-18 NOTE — Progress Notes (Signed)
[facility] Progress Note  Name: Felicia Williams MRN: [id number] DOB: August 04, 1940  Chief Complaint:  Fluid build up   Subjective:  Pt had HD on 6/27 with 1.5 kg UF.  She had 200 mL UOP charted over 6/27 as well as 3 uncharted voids.  Missed a dose of coreg and hydralazine yesterday and hydralazine the day before due to HD and procedures.    Review of systems:  Denies shortness of breath  Denies n/v No cp  Has been up today   ---------------------- Background:  Felicia Williams a 78 y.o.femalewith a history of CKD stage IV followed by [place] who first presented to [facility] on 6/17 with nausea, diarrhea,and fatigue/weakness.  She had also had outpatient labs which indicated that her creatinine had increased to 4.89 per charting and they recommended she come to the ER where her Cr was found to be 6.42. (For reference, her creatininewas2.91on5/6/20 andCrhad been 2.0 - 2.3with aGFR around 20 since 03/2018 before that time.) She had been on jardiance, lasix, and lisinopril which were all held.  I saw her last week and planned for HD initiation on 6/22 if no improvement.  Per chart review, she was discharged earlier this week for close outpatient follow-up.  She states that she initially felt ok but then felt fluid building up.  Had nausea yesterday and wasn't able to tolerate PO meds.  She is in agreement with starting dialysis after access placement.    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 Up/cr ratio 2342 mg/g in 08/2018 and UA with 23-30 RBC in 06/2018 and 03/2018 with TNTC RBC. She denies ever taking any steroids or immunosuppression for her kidneys or any chemo agents. Work-up here notable for renal US with 9.9 cm  kidneys bilaterally and no hydro. CK level 90. Urinalysis with proteinuria and hematuria. She is also followed by Dr. Delton Coombes in hem/onc and had recent IV iron infusion on 6/15 and states last retacrit was on 6/1.   Intake/Output Summary (Last 24 hours) at 01/18/2019 0857 Last data filed at 01/18/2019 3818 Gross per 24 hour  Intake 720 ml  Output 1700 ml  Net -980 ml    Vitals:  Vitals:   01/17/19 1856 01/17/19 2020 01/18/19 0347 01/18/19 0500  BP: (!) 196/68 (!) 158/70 (!) 186/85   Pulse: 68 74 74   Resp: (!) 22 20 16    Temp: 98.5 F (36.9 C) 98.1 F (36.7 C) 98.4 F (36.9 C)   TempSrc: Oral Oral Oral   SpO2: 96% 96% 93%   Weight: 57.2 kg   57.2 kg  Height:         Physical Exam:  General adult female in bed in no acute distress HEENT normocephalic atraumatic extraocular movements intact sclera anicteric Neck supple trachea midline Lungs clear to auscultation bilaterally normal work of breathing at rest  Heart regular rate and rhythm no rubs or gallops appreciated Abdomen soft nontender nondistended Extremities 1+ edema lower extremities  Psych normal mood and affect Neuro - alert and oriented x 3; conversant Access RIJ tunneled catheter  Medications reviewed   Labs:  BMP Latest Ref Rng & Units 01/18/2019 01/17/2019 01/16/2019  Glucose 70 - 99 mg/dL 90 127(H) 114(H)  BUN 8 - 23 mg/dL 39(H) 72(H) 103(H)  Creatinine 0.44 - 1.00 mg/dL 4.13(H) 5.94(H)  7.66(H)  Sodium 135 - 145 mmol/L 137 139 139  Potassium 3.5 - 5.1 mmol/L 3.8 3.8 4.2  Chloride 98 - 111 mmol/L 99 102 104  CO2 22 - 32 mmol/L 24 23 22   Calcium 8.9 - 10.3 mg/dL 7.2(L) 7.2(L) 7.1(L)     Assessment/Plan:   # 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  Fluid overload and nausea/uremic sx's - Hx AKI with progression.  CKD Secondary to fibrillary GN.  (Cr 2.9 in 11/2018 and before that GFR 20 since last fall - last biopsy with advanced fibrillary glomerulonephritis with focal and segmental glomerular  scarring).  First HD on 6/26 after tunneled catheter with IR - Plan for next HD on 6/29 and will transition to MWF schedule for HD for now - We have initiated search for an outpatient HD unit - pt lives in [place] - She followed with Dr. Al Corpus at [facility] previously who she states does not go to [place].  Would like to eventually do dialysis at home if possible and in that event may be able to transfer back to her at some point.  She is calling Dr. Johnnette Gourd office to keep them in the loop - Strict ins/outs  # HTN with CKD  - Start back procardia 30 mg daily  - Continue HD and follow for the need for titration of regimen   # Metabolic acidosis - 2/2 CKD  - Continue with HD  # Anemia with CKD  - Felt secondary to relative iron deficiency and CKD  - s/p IV iron on 6/15 and followed by hem/onc - Dr. Delton Coombes.  s/p aranesp 60 mcg once on 6/18.  Normally on procit with hem/onc  - Aranesp 60 mcg once on 6/27 - CBC in AM   # 2HPT, Hyperphosphatemia  - 2/2 CKD.  PTH 155  - low phos diet including stopping dark sodas. On calcitriol - as outpt will transition to dosing at HD unit - improving with HD and may be able to stop binder  # Osteoporosis  - Would avoid prolia with her GFR as concern for exacerbating hypocalcemia  # RA - Noted she is currently ordered for a pred taper    Claudia Desanctis, MD 01/18/2019 8:57 AM

## 2019-01-19 LAB — CBC
HCT: 29.7 % — ABNORMAL LOW (ref 36.0–46.0)
Hemoglobin: 9.5 g/dL — ABNORMAL LOW (ref 12.0–15.0)
MCH: 30.7 pg (ref 26.0–34.0)
MCHC: 32 g/dL (ref 30.0–36.0)
MCV: 96.1 fL (ref 80.0–100.0)
Platelets: 231 10*3/uL (ref 150–400)
RBC: 3.09 MIL/uL — ABNORMAL LOW (ref 3.87–5.11)
RDW: 16.6 % — ABNORMAL HIGH (ref 11.5–15.5)
WBC: 14.9 10*3/uL — ABNORMAL HIGH (ref 4.0–10.5)
nRBC: 0.3 % — ABNORMAL HIGH (ref 0.0–0.2)

## 2019-01-19 LAB — RENAL FUNCTION PANEL
Albumin: 2 g/dL — ABNORMAL LOW (ref 3.5–5.0)
Anion gap: 12 (ref 5–15)
BUN: 57 mg/dL — ABNORMAL HIGH (ref 8–23)
CO2: 23 mmol/L (ref 22–32)
Calcium: 7.5 mg/dL — ABNORMAL LOW (ref 8.9–10.3)
Chloride: 101 mmol/L (ref 98–111)
Creatinine, Ser: 5.34 mg/dL — ABNORMAL HIGH (ref 0.44–1.00)
GFR calc Af Amer: 8 mL/min — ABNORMAL LOW (ref >60)
GFR calc non Af Amer: 7 mL/min — ABNORMAL LOW (ref >60)
Glucose, Bld: 109 mg/dL — ABNORMAL HIGH (ref 70–99)
Phosphorus: 3.8 mg/dL (ref 2.5–4.6)
Potassium: 3.2 mmol/L — ABNORMAL LOW (ref 3.5–5.1)
Sodium: 136 mmol/L (ref 135–145)

## 2019-01-19 MED ORDER — HEPARIN SODIUM (PORCINE) 1000 UNIT/ML IJ SOLN
INTRAMUSCULAR | Status: AC
  Administered 2019-01-19: 3200 [IU] via INTRAVENOUS_CENTRAL
  Filled 2019-01-19: qty 4

## 2019-01-19 MED ORDER — CALCITRIOL 0.25 MCG PO CAPS
ORAL_CAPSULE | ORAL | Status: AC
  Filled 2019-01-19: qty 1

## 2019-01-19 MED ORDER — RENA-VITE PO TABS
1.0000 | ORAL_TABLET | Freq: Every day | ORAL | Status: DC
  Administered 2019-01-19: 1 via ORAL
  Filled 2019-01-19: qty 1

## 2019-01-19 MED ORDER — ZOLPIDEM TARTRATE 5 MG PO TABS
5.0000 mg | ORAL_TABLET | Freq: Every evening | ORAL | Status: DC | PRN
  Administered 2019-01-19: 5 mg via ORAL
  Filled 2019-01-19: qty 1

## 2019-01-19 NOTE — Progress Notes (Addendum)
Renal Navigator has not heard back from Dr. Johnnette Gourd office from phone call placed on Friday, 01/16/19 regarding closest HD clinic to patient. Renal Navigator contacted patient to discuss and patient states is not concerned with staying under Dr. Johnnette Gourd care and that she just wants to receive HD closest to home. She is familiar with where the [facility] clinic is and states she would like to be referred to this clinic.  Renal Navigator submitted referral to [facility] Admissions for [facility] HD clinic.  Alphonzo Cruise, [place] Renal Navigator [phone]

## 2019-01-19 NOTE — Progress Notes (Signed)
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 Jamey Reas Secondary hyperparathyroidism Metabolic acidosis on admission 2/2 ESRD htn per nephrologist Phos improved on PhosLo 667 3 times daily and HD--stop binders as per renal Acidosis has cleared Last iron stores 6/4-?  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 On prednisone taper, complete as outpatient-follow up with her rheumatologist--[facility]--She will re-schedule the same in the next week Leukocytosis ? 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 Al Corpus Anemia of renal disease HTN Osteoporosis Rheumatoid arthritis  Admit 6/25 nausea anorexia diarrhea fatigue-this is after being seen 6/17 [facility] ED and following up with her nephrologist In ED ^^ blood pressure-Rx hydralazine creatinine 6.4 up from baseline 2.0  First HD 6/26  DVT Lovenox Code Status: Full Communication: None today Disposition Plan: Inpatient pending resolution and clip with dialysis, and as per nephro re: challenging EDW  Jacynda Brunke, MD  [facility] Via amion app [place] [email or web address] 7PM-7AM contact night coverage as above 01/19/2019, 1:51 PM  LOS: 4 days   Consultants:  Nephrology  Procedures:  HD,  Antimicrobials:  None  Interval history/Subjective:  Feels a little tired today seen on HD unit--otherwise no  new issues Tells me about chr diarrhea  Objective:  Vitals:  Vitals:   01/19/19 1230 01/19/19 1300  BP: (!) 163/81 (!) 161/83  Pulse: 66 67  Resp: 18 14  Temp:    SpO2:      Exam:  Pleasant , AAA No icterus no pallor Moderate dentition No submandibular lymphadenopathy Chest clear S1-S2 no murmur Neurologically intact moving all 4 limbs equally   I have personally reviewed the following:   DATA k 3.2 [replacing in HD] Phos 6.5-->4.3-->3.8 albumin 2.1 calcium 7.2  Scheduled Meds: . calcitRIOL  0.25 mcg Oral Daily  . calcium acetate  667 mg Oral TID WC  . calcium carbonate  200 mg of elemental calcium Oral BID WC  . carvedilol  12.5 mg Oral BID WC  . Chlorhexidine Gluconate Cloth  6 each Topical Q0600  . feeding supplement (NEPRO CARB STEADY)  237 mL Oral TID WC  . hydrALAZINE  50 mg Oral BID  . NIFEdipine  30 mg Oral Daily  . pantoprazole  40 mg Oral Daily  . predniSONE  25 mg Oral Q breakfast   Followed by  . [START ON 01/23/2019] predniSONE  20 mg Oral Q breakfast   Followed by  . [START ON 01/28/2019] predniSONE  15 mg Oral Q breakfast   Followed by  . [START ON 02/02/2019] predniSONE  10 mg Oral Q breakfast   Followed by  . [START ON 02/07/2019] predniSONE  5 mg Oral Q breakfast   Continuous Infusions: . sodium chloride    . sodium chloride    .  ceFAZolin (ANCEF) IV      Active Problems:   Renal failure   LOS: 4 days

## 2019-01-19 NOTE — Plan of Care (Signed)
  Problem: Safety: Goal: Ability to remain free from injury will improve Outcome: Progressing   

## 2019-01-19 NOTE — Progress Notes (Signed)
Initial Nutrition Assessment  DOCUMENTATION CODES:   Not applicable  INTERVENTION:   Continue Nepro Shake po BID, each supplement provides 425 kcal and 19 grams protein  Provided Renal Dialysis Diet education ; written material provided to patient  Add Rena-Vit   NUTRITION DIAGNOSIS:   Food and nutrition related knowledge deficit related to (new ESRD on HD) as evidenced by (Consult for diet education).  GOAL:   Patient will meet greater than or equal to 90% of their needs  MONITOR:   Supplement acceptance, PO intake, Labs, Weight trends  REASON FOR ASSESSMENT:   Consult Diet education(New HD)  ASSESSMENT:   78 yo female with CKD IV admitted with progression to ESRD with fluid overload and uremic symptoms requiring initiation of HD. PMH includes CKD IV secondary to FSGS-fibrillary GN, HTN, RA  6/26 tunneled HD cath placed, 1st HD 6/27 2nd HD 6/29 3rd HD  +nausea, anorexia, diarrhea, fatigue prior to admission; recorded po intake 50-100% of meals. 85% of meals on average  Phosphorus improved with HD and binder therapy; follow trend  Weight down to 56.4 kg post HD. Admission weight 60.2 kg  Labs:  Phosphorus 3.8 (wdl), potassium 3.2 (L), corrected calcium 9.1 (wdl), albumin 2.0 (L), iPTH 155 (H) Meds: prednisone, PhosLo, Tums, calcitriol   NUTRITION - FOCUSED PHYSICAL EXAM:  Unable to assess  Diet Order:   Diet Order            Diet renal with fluid restriction Fluid restriction: 1200 mL Fluid; Room service appropriate? Yes; Fluid consistency: Thin  Diet effective now              EDUCATION NEEDS:   Education needs have been addressed  Skin:  Skin Assessment: Reviewed RN Assessment  Last BM:  6/28  Height:   Ht Readings from Last 1 Encounters:  01/15/19 4\' 11"  (1.499 m)    Weight:   Wt Readings from Last 1 Encounters:  01/19/19 56.4 kg    BMI:  Body mass index is 25.11 kg/m.  Estimated Nutritional Needs:   Kcal:  1700-1960  kcals  Protein:  85-98 g  Fluid:  1000 mL plus UOP   [employer] MS, RDN, LDN, CNSC [phone] Pager  [phone] Weekend/On-Call Pager

## 2019-01-19 NOTE — Procedures (Signed)
Patient was seen on dialysis and the procedure was supervised.  BFR 300  Via TDC BP is 156/71. Changed to 3 K bath, Goal UF 1-2 Kg.  CLIP in porgress.   Patient appears to be tolerating treatment well.  Felicia Williams Tanna Furry 01/19/2019

## 2019-01-20 LAB — RENAL FUNCTION PANEL
Albumin: 2 g/dL — ABNORMAL LOW (ref 3.5–5.0)
Anion gap: 9 (ref 5–15)
BUN: 39 mg/dL — ABNORMAL HIGH (ref 8–23)
CO2: 24 mmol/L (ref 22–32)
Calcium: 7.4 mg/dL — ABNORMAL LOW (ref 8.9–10.3)
Chloride: 103 mmol/L (ref 98–111)
Creatinine, Ser: 4.22 mg/dL — ABNORMAL HIGH (ref 0.44–1.00)
GFR calc Af Amer: 11 mL/min — ABNORMAL LOW (ref >60)
GFR calc non Af Amer: 9 mL/min — ABNORMAL LOW (ref >60)
Glucose, Bld: 107 mg/dL — ABNORMAL HIGH (ref 70–99)
Phosphorus: 3 mg/dL (ref 2.5–4.6)
Potassium: 4.1 mmol/L (ref 3.5–5.1)
Sodium: 136 mmol/L (ref 135–145)

## 2019-01-20 MED ORDER — RENA-VITE PO TABS: 1.0000 | ORAL_TABLET | Freq: Every day | ORAL | 0 refills | Status: DC

## 2019-01-20 MED ORDER — CALCIUM CARBONATE ANTACID 500 MG PO CHEW: 200.0000 mg | CHEWABLE_TABLET | Freq: Two times a day (BID) | ORAL | 0 refills | Status: DC

## 2019-01-20 MED ORDER — NIFEDIPINE ER 30 MG PO TB24: 30.0000 mg | ORAL_TABLET | Freq: Every day | ORAL | 0 refills | Status: DC

## 2019-01-20 MED ORDER — CHLORHEXIDINE GLUCONATE CLOTH 2 % EX PADS: 6.0000 | MEDICATED_PAD | Freq: Every day | CUTANEOUS | Status: DC

## 2019-01-20 MED ORDER — DARBEPOETIN ALFA 60 MCG/0.3ML IJ SOSY: 60.0000 ug | PREFILLED_SYRINGE | INTRAMUSCULAR | Status: DC

## 2019-01-20 MED ORDER — SODIUM CHLORIDE 0.9 % IV SOLN: 125.0000 mg | INTRAVENOUS | Status: DC

## 2019-01-20 NOTE — Progress Notes (Signed)
Renal Navigator received message from OP HD clinic/[name] that they are in need of her Medicare Beneficiary ID #. Renal Navigator obtained this from patient and provided clinic with it. Renal Navigator will continue to follow.  Alphonzo Cruise, [place] Renal Navigator [phone]

## 2019-01-20 NOTE — TOC Initial Note (Signed)
Transition of Care  [facility] Hospital) - Initial/Assessment Note    Patient Details  Name: Felicia Williams MRN: [id number] Date of Birth: [birth year]  Transition of Care [facility]) CM/SW Contact:    Zenon Mayo, RN Phone Number: 01/20/2019, 3:43 PM  Clinical Narrative:                 From home , for dc today, grand daughter lives with her , pta indep, she has an apt with her PCP for tomorrow and she states she will call and change it to another date.  She has no needs at this time.  Expected Discharge Plan: Home/Self Care Barriers to Discharge: No Barriers Identified   Patient Goals and CMS Choice Patient states their goals for this hospitalization and ongoing recovery are:: get better   Choice offered to / list presented to : NA  Expected Discharge Plan and Services Expected Discharge Plan: Home/Self Care In-house Referral: NA Discharge Planning Services: CM Consult Post Acute Care Choice: NA Living arrangements for the past 2 months: Single Family Home Expected Discharge Date: 01/20/19               DME Arranged: (NA)         HH Arranged: NA          Prior Living Arrangements/Services Living arrangements for the past 2 months: Single Family Home Lives with:: Relatives Patient language and need for interpreter reviewed:: Yes Do you feel safe going back to the place where you live?: Yes      Need for Family Participation in Patient Care: No (Comment) Care giver support system in place?: No (comment)   Criminal Activity/Legal Involvement Pertinent to Current Situation/Hospitalization: No - Comment as needed  Activities of Daily Living Home Assistive Devices/Equipment: None ADL Screening (condition at time of admission) Patient's cognitive ability adequate to safely complete daily activities?: Yes Is the patient deaf or have difficulty hearing?: No Does the patient have difficulty seeing, even when wearing glasses/contacts?: No Does the patient have difficulty  concentrating, remembering, or making decisions?: No Patient able to express need for assistance with ADLs?: Yes Does the patient have difficulty dressing or bathing?: No Independently performs ADLs?: Yes (appropriate for developmental age) Does the patient have difficulty walking or climbing stairs?: Yes Weakness of Legs: Both Weakness of Arms/Hands: None  Permission Sought/Granted                  Emotional Assessment Appearance:: Appears stated age Attitude/Demeanor/Rapport: Gracious Affect (typically observed): Appropriate Orientation: : Oriented to Self, Oriented to Place, Oriented to  Time, Oriented to Situation Alcohol / Substance Use: Not Applicable Psych Involvement: No (comment)  Admission diagnosis:  Pleural effusion, bilateral [J90] Acute renal failure superimposed on stage 5 chronic kidney disease, not on chronic dialysis, unspecified acute renal failure type ([place]) [N17.9, N18.5] Patient Active Problem List   Diagnosis Date Noted  . Renal failure 01/15/2019  . ARF (acute renal failure) ([place]) 01/07/2019  . Glomerulonephritis 01/07/2019  . Anxiety and depression 01/07/2019  . Psoriasis 01/07/2019  . Tachycardia, unspecified 01/07/2019  . Anemia in stage 4 chronic kidney disease ([place]) 08/18/2018  . Essential hypertension, benign 01/26/2014  . Palpitations 01/26/2014  . Fatigue due to depression 01/26/2014   PCP:  Octavio Graves, DO Pharmacy:   [facility] [address] [place]  [address] Phone: [phone] Fax: [place], [place] [address]  [place] [address] [place] [address] Phone: [phone] Fax: [phone]     Social Determinants of Health (SDOH) Interventions    Readmission Risk Interventions Readmission Risk Prevention Plan 01/20/2019  Transportation Screening Complete  PCP or Specialist Appt within 3-5 Days Complete  HRI or [place] Complete  Social Work Consult for [place] Planning/Counseling Complete  Palliative Care Screening Not Applicable  Medication Review Press photographer) Complete  Some recent data might be hidden

## 2019-01-20 NOTE — Discharge Summary (Signed)
Physician Discharge Summary  Felicia Williams TDV:[id number] DOB: 19-Mar-1941 DOA: 01/15/2019  PCP: Octavio Graves, DO  Admit date: 01/15/2019 Discharge date: 01/20/2019  Time spent: 22 minutes  Recommendations for Outpatient Follow-up:  1. Outpatient follow-up-needs renal panel/cbc 2. 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 Jamey Reas Secondary hyperparathyroidism Metabolic acidosis on admission 2/2 ESRD htn per nephrologist Phos improved on PhosLo 667  Acidosis has cleared Last iron stores 6/4-?  Repeat 4 weeks Resumed procardia 30 CL, on Hydralazine 50 bid, on Coreg 12.5bid Nausea 6/27 calcium carbonate low-dose started Seems better Rheumatoid arthritis--: Possibly related fibrillary GN?? On prednisone taper, complete as outpatient-follow up with her rheumatologist--[facility]--She will re-schedule  Leukocytosis ? 2/2 steroids-no fever-monitor Osteoporosis Hypocalcemia, hypoalbuminemia Corrected calcium is >9 Chr Diarrhea--been going on ~ 1 year and post prandial Uses imodium usually--work-up OP as indicated  Procedures: HD   Consultations:  neprhology  Discharge Exam: Vitals:   01/20/19 0426 01/20/19 0744  BP: 136/65 (!)  143/76  Pulse: 86 80  Resp: 17 18  Temp: 98.2 F (36.8 C) 98.6 F (37 C)  SpO2: 97% 98%    General: awake alert coherent in nad Cardiovascular:  s1 s2 no m/r/g Respiratory: clear no added sound TDC in chest--no access  No le edema Cataracts Neuro intact  Discharge Instructions   Discharge Instructions    Diet - low sodium heart healthy   Complete by: As directed    Discharge instructions   Complete by: As directed    Follow with [place] Follow with [place] will notice a couple of new meds and some have been stopped---ask your regular Dr. About these changes Good luck and happy summer   Increase activity slowly   Complete by: As directed      Allergies as of 01/20/2019      Reactions   Sulfa Antibiotics Other (See Comments)   Shut pt's kidneys down      Medication List    STOP taking these medications   furosemide 80 MG tablet Commonly known as: LASIX   sodium bicarbonate 650 MG tablet     TAKE these medications   calcitRIOL 0.25 MCG capsule Commonly known as: ROCALTROL Take 1 capsule (0.25 mcg total) by mouth daily for 30 days.   calcium acetate 667 MG capsule Commonly known as: PHOSLO Take 1 capsule (667 mg total) by mouth 3 (three) times daily with meals for 30 days.   calcium carbonate 500 MG chewable tablet Commonly known as: TUMS - dosed in mg elemental calcium Chew 1 tablet (200 mg of elemental calcium total) by mouth 2 (two) times daily with a meal.   carvedilol 12.5 MG tablet Commonly known as: COREG Take 12.5 mg by mouth 2 (two) times daily with a meal.   epoetin alfa 20000 UNIT/ML injection  Commonly known as: EPOGEN Inject 20,000 Units into the skin every 14 (fourteen) days.   hydrALAZINE 50 MG tablet Commonly known as: APRESOLINE Take 50 mg by mouth 2 (two) times a day.   loperamide 2 MG capsule Commonly known as: IMODIUM Take 1 capsule (2 mg total) by mouth as needed for diarrhea or loose stools.   multivitamin  Tabs tablet Take 1 tablet by mouth at bedtime.   NIFEdipine 30 MG 24 hr tablet Commonly known as: ADALAT CC Take 1 tablet (30 mg total) by mouth daily. Start taking on: January 21, 2019   ondansetron 4 MG disintegrating tablet Commonly known as: ZOFRAN-ODT Take 1 tablet (4 mg total) by mouth every 8 (eight) hours as needed for nausea or vomiting.   pantoprazole 40 MG tablet Commonly known as: PROTONIX Take 40 mg by mouth daily.   predniSONE 10 MG tablet Commonly known as: DELTASONE Take 3 tablets (30 mg total) by mouth daily with breakfast for 5 days, THEN 2.5 tablets (25 mg total) daily with breakfast for 5 days, THEN 2 tablets (20 mg total) daily with breakfast for 5 days, THEN 1.5 tablets (15 mg total) daily with breakfast for 5 days, THEN 1 tablet (10 mg total) daily with breakfast for 5 days, THEN 0.5 tablets (5 mg total) daily with breakfast for 5 days. Start taking on: January 13, 2019   Vitamin D (Cholecalciferol) 50 MCG (2000 UT) Caps Take 2,000 Units by mouth daily.      Allergies  Allergen Reactions  . Sulfa Antibiotics Other (See Comments)    Shut pt's kidneys down   Follow-up Information    Octavio Graves, DO Follow up.   Why: patient will call to change apt date Contact information: [address] [place] [address] [phone]            The results of significant diagnostics from this hospitalization (including imaging, microbiology, ancillary and laboratory) are listed below for reference.    Significant Diagnostic Studies: Dg Chest 2 View  Result Date: 01/15/2019 CLINICAL DATA:  Shortness of breath with worsening peripheral edema. EXAM: CHEST - 2 VIEW COMPARISON:  Chest x-ray dated July 18, 2016. FINDINGS: The heart is at the upper limits of normal in size. Normal mediastinal contours. Normal pulmonary vascularity. Trace bilateral pleural effusions. Minimal atelectasis at the lung bases and in the peripheral lingula and right upper lobe. No  consolidation or pneumothorax. No acute osseous abnormality. IMPRESSION: 1. Trace bilateral pleural effusions. Electronically Signed   By: Titus Dubin M.D.   On: 01/15/2019 15:21   US Renal  Result Date: 01/07/2019 CLINICAL DATA:  Acute renal failure EXAM: RENAL / URINARY TRACT ULTRASOUND COMPLETE COMPARISON:  None. FINDINGS: Right Kidney: Renal measurements: 9.9 x 4.6 x 4.9 cm = volume: 115.3 mL. Increased cortical echogenicity Left Kidney: Renal measurements: 9.9 x 4.9 x 5.0 cm = volume: 127.5 mL. Increased cortical echogenicity Bladder: Appears normal for degree of bladder distention. IMPRESSION: Increased cortical echogenicity bilaterally consistent with medical renal disease. No hydronephrosis or other abnormality. Electronically Signed   By: Dorise Bullion III M.D   On: 01/07/2019 16:27   Ir Fluoro Guide Cv Line Right  Result Date: 01/16/2019 CLINICAL DATA:  Renal failure and need for tunneled hemodialysis catheter. EXAM: TUNNELED CENTRAL VENOUS HEMODIALYSIS CATHETER PLACEMENT WITH ULTRASOUND AND FLUOROSCOPIC GUIDANCE ANESTHESIA/SEDATION: 1.0 mg IV Versed; 50 mcg IV Fentanyl. Total Moderate Sedation Time:   21 minutes. The patient's level of consciousness and physiologic status were continuously  monitored during the procedure by Radiology nursing. MEDICATIONS: 2 g IV Ancef. FLUOROSCOPY TIME:  24 seconds.  0.9 mGy. PROCEDURE: The procedure, risks, benefits, and alternatives were explained to the patient. Questions regarding the procedure were encouraged and answered. The patient understands and consents to the procedure. A timeout was performed prior to initiating the procedure. The right neck and chest were prepped with chlorhexidine in a sterile fashion, and a sterile drape was applied covering the operative field. Maximum barrier sterile technique with sterile gowns and gloves were used for the procedure. Local anesthesia was provided with 1% lidocaine. Ultrasound was utilized to confirm patency  of the right internal jugular vein. After creating a small venotomy incision, a 21 gauge needle was advanced into the right internal jugular vein under direct, real-time ultrasound guidance. Ultrasound image documentation was performed. After securing guidewire access, an 8 Fr dilator was placed. A J-wire was kinked to measure appropriate catheter length. A Palindrome tunneled hemodialysis catheter measuring 19 cm from tip to cuff was chosen for placement. This was tunneled in a retrograde fashion from the chest wall to the venotomy incision. At the venotomy, serial dilatation was performed and a 16 Fr peel-away sheath was placed over a guidewire. The catheter was then placed through the sheath and the sheath removed. Final catheter positioning was confirmed and documented with a fluoroscopic spot image. The catheter was aspirated, flushed with saline, and injected with appropriate volume heparin dwells. The venotomy incision was closed with subcutaneous 4-0 Vicryl. Dermabond was applied to the incision. The catheter exit site was secured with 0-Prolene retention sutures. COMPLICATIONS: None.  No pneumothorax. FINDINGS: After catheter placement, the tip lies in the right atrium. The catheter aspirates normally and is ready for immediate use. IMPRESSION: Placement of tunneled hemodialysis catheter via the right internal jugular vein. The catheter tip lies in the right atrium. The catheter is ready for immediate use. Electronically Signed   By: Aletta Edouard M.D.   On: 01/16/2019 15:41   Ir US Guide Vasc Access Right  Result Date: 01/16/2019 CLINICAL DATA:  Renal failure and need for tunneled hemodialysis catheter. EXAM: TUNNELED CENTRAL VENOUS HEMODIALYSIS CATHETER PLACEMENT WITH ULTRASOUND AND FLUOROSCOPIC GUIDANCE ANESTHESIA/SEDATION: 1.0 mg IV Versed; 50 mcg IV Fentanyl. Total Moderate Sedation Time:   21 minutes. The patient's level of consciousness and physiologic status were continuously monitored during  the procedure by Radiology nursing. MEDICATIONS: 2 g IV Ancef. FLUOROSCOPY TIME:  24 seconds.  0.9 mGy. PROCEDURE: The procedure, risks, benefits, and alternatives were explained to the patient. Questions regarding the procedure were encouraged and answered. The patient understands and consents to the procedure. A timeout was performed prior to initiating the procedure. The right neck and chest were prepped with chlorhexidine in a sterile fashion, and a sterile drape was applied covering the operative field. Maximum barrier sterile technique with sterile gowns and gloves were used for the procedure. Local anesthesia was provided with 1% lidocaine. Ultrasound was utilized to confirm patency of the right internal jugular vein. After creating a small venotomy incision, a 21 gauge needle was advanced into the right internal jugular vein under direct, real-time ultrasound guidance. Ultrasound image documentation was performed. After securing guidewire access, an 8 Fr dilator was placed. A J-wire was kinked to measure appropriate catheter length. A Palindrome tunneled hemodialysis catheter measuring 19 cm from tip to cuff was chosen for placement. This was tunneled in a retrograde fashion from the chest wall to the venotomy incision. At the venotomy, serial dilatation was  performed and a 16 Fr peel-away sheath was placed over a guidewire. The catheter was then placed through the sheath and the sheath removed. Final catheter positioning was confirmed and documented with a fluoroscopic spot image. The catheter was aspirated, flushed with saline, and injected with appropriate volume heparin dwells. The venotomy incision was closed with subcutaneous 4-0 Vicryl. Dermabond was applied to the incision. The catheter exit site was secured with 0-Prolene retention sutures. COMPLICATIONS: None.  No pneumothorax. FINDINGS: After catheter placement, the tip lies in the right atrium. The catheter aspirates normally and is ready for  immediate use. IMPRESSION: Placement of tunneled hemodialysis catheter via the right internal jugular vein. The catheter tip lies in the right atrium. The catheter is ready for immediate use. Electronically Signed   By: Aletta Edouard M.D.   On: 01/16/2019 15:41    Microbiology: Recent Results (from the past 240 hour(s))  Novel Coronavirus,NAA,(SEND-OUT TO REF LAB - TAT 24-48 hrs); Hosp Order     Status: None   Collection Time: 01/15/19  3:25 PM   Specimen: Nasopharyngeal Swab; Respiratory  Result Value Ref Range Status   SARS-CoV-2, NAA NOT DETECTED NOT DETECTED Final    Comment: (NOTE) This test was developed and its performance characteristics determined by [employer]. This test has not been FDA cleared or approved. This test has been authorized by FDA under an Emergency Use Authorization (EUA). This test is only authorized for the duration of time the declaration that circumstances exist justifying the authorization of the emergency use of in vitro diagnostic tests for detection of SARS-CoV-2 virus and/or diagnosis of COVID-19 infection under section 564(b)(1) of the Act, 21 U.S.C. 478GNF-6(O)(1), unless the authorization is terminated or revoked sooner. When diagnostic testing is negative, the possibility of a false negative result should be considered in the context of a patient's recent exposures and the presence of clinical signs and symptoms consistent with COVID-19. An individual without symptoms of COVID-19 and who is not shedding SARS-CoV-2 virus would expect to have a negative (not detected) result in this assay. Performed  At: [facility] [address] [place], [place] [id number] Rush Farmer MD NG:[id number]    [place]  Final    Comment: Performed at [facility], [address], [place], [place] [address]  SARS Coronavirus 2 ([place] - Performed in [facility] hospital lab), Hosp Order     Status: None   Collection  Time: 01/15/19  5:28 PM   Specimen: Nasopharyngeal Swab  Result Value Ref Range Status   SARS Coronavirus 2 NEGATIVE NEGATIVE Final    Comment: (NOTE) If result is NEGATIVE SARS-CoV-2 target nucleic acids are NOT DETECTED. The SARS-CoV-2 RNA is generally detectable in upper and lower  respiratory specimens during the acute phase of infection. The lowest  concentration of SARS-CoV-2 viral copies this assay can detect is 250  copies / mL. A negative result does not preclude SARS-CoV-2 infection  and should not be used as the sole basis for treatment or other  patient management decisions.  A negative result may occur with  improper specimen collection / handling, submission of specimen other  than nasopharyngeal swab, presence of viral mutation(s) within the  areas targeted by this assay, and inadequate number of viral copies  (<250 copies / mL). A negative result must be combined with clinical  observations, patient history, and epidemiological information. If result is POSITIVE SARS-CoV-2 target nucleic acids are DETECTED. The SARS-CoV-2 RNA is generally detectable in upper and lower  respiratory  specimens dur ing the acute phase of infection.  Positive  results are indicative of active infection with SARS-CoV-2.  Clinical  correlation with patient history and other diagnostic information is  necessary to determine patient infection status.  Positive results do  not rule out bacterial infection or co-infection with other viruses. If result is PRESUMPTIVE POSTIVE SARS-CoV-2 nucleic acids MAY BE PRESENT.   A presumptive positive result was obtained on the submitted specimen  and confirmed on repeat testing.  While 2019 novel coronavirus  (SARS-CoV-2) nucleic acids may be present in the submitted sample  additional confirmatory testing may be necessary for epidemiological  and / or clinical management purposes  to differentiate between  SARS-CoV-2 and other Sarbecovirus currently known  to infect humans.  If clinically indicated additional testing with an alternate test  methodology [phone]) is advised. The SARS-CoV-2 RNA is generally  detectable in upper and lower respiratory sp ecimens during the acute  phase of infection. The expected result is Negative. Fact Sheet for Patients:  StrictlyIdeas.no Fact Sheet for Healthcare Providers: BankingDealers.co.za This test is not yet approved or cleared by the [place] FDA and has been authorized for detection and/or diagnosis of SARS-CoV-2 by FDA under an Emergency Use Authorization (EUA).  This EUA will remain in effect (meaning this test can be used) for the duration of the COVID-19 declaration under Section 564(b)(1) of the Act, 21 U.S.C. section 360bbb-3(b)(1), unless the authorization is terminated or revoked sooner. Performed at [facility], [address] [place], [place] [address]      Labs: Basic Metabolic Panel: Recent Labs  Lab 01/15/19 1423 01/16/19 0312 01/17/19 1140 01/18/19 0543 01/19/19 0750 01/20/19 0608  NA 138 139 139 137 136 136  K 3.6 4.2 3.8 3.8 3.2* 4.1  CL 101 104 102 99 101 103  CO2 20* 22 23 24 23 24   GLUCOSE 106* 114* 127* 90 109* 107*  BUN 98* 103* 72* 39* 57* 39*  CREATININE 7.46* 7.66* 5.94* 4.13* 5.34* 4.22*  CALCIUM 7.3* 7.1* 7.2* 7.2* 7.5* 7.4*  MG 2.3  --   --   --   --   --   PHOS  --   --  6.5* 4.3 3.8 3.0   Liver Function Tests: Recent Labs  Lab 01/15/19 1423 01/17/19 1140 01/18/19 0543 01/19/19 0750 01/20/19 0608  AST 25  --   --   --   --   ALT 22  --   --   --   --   ALKPHOS 50  --   --   --   --   BILITOT 0.5  --   --   --   --   PROT 5.2*  --   --   --   --   ALBUMIN 2.4* 1.9* 2.1* 2.0* 2.0*   No results for input(s): LIPASE, AMYLASE in the last 168 hours. No results for input(s): AMMONIA in the last 168 hours. CBC: Recent Labs  Lab 01/15/19 1423 01/16/19 0312 01/19/19 0750  WBC 9.8 7.4 14.9*   NEUTROABS 7.3  --   --   HGB 9.3* 8.5* 9.5*  HCT 29.5* 26.4* 29.7*  MCV 95.8 93.3 96.1  PLT 251 228 231   Cardiac Enzymes: Recent Labs  Lab 01/15/19 1423  CKTOTAL 100   BNP: BNP (last 3 results) No results for input(s): BNP in the last 8760 hours.  ProBNP (last 3 results) No results for input(s): PROBNP in the last 8760 hours.  CBG: No results  for input(s): GLUCAP in the last 168 hours.     Signed:  Nita Sells MD   [facility] 01/20/2019, 3:59 PM

## 2019-01-20 NOTE — Progress Notes (Signed)
Renal Navigator contacted [facility]missions/[facility] to request update on OP HD referral to [facility] clinic.  Patient has been accepted at [facility] clinic for OP HD treatment on a MWF schedule with a seat time of 11:30. She will start in the clinic tomorrow, Wednesday 01/21/19 and knows she needs to arrive at 9am to complete paperwork and intake prior to first treatment.  Renal Navigator notified Nephrologist/Dr. Carolin Sicks who states patient will discharge today. Patient aware and agreeable to plan. OP HD clinic aware that patient agreeable and will start in clinic on 01/21/19.   [facility]) [address] [place], [place]  Alphonzo Cruise, [place] Renal Navigator [phone]

## 2019-01-20 NOTE — TOC Transition Note (Signed)
Transition of Care [facility]) - CM/SW Discharge Note   Patient Details  Name: Felicia Williams MRN: [id number] Date of Birth: 1941-07-10  Transition of Care [facility]) CM/SW Contact:  Zenon Mayo, RN Phone Number: 01/20/2019, 3:45 PM   Clinical Narrative:    For dc today, she has no needs at time of dc.   Final next level of care: Home/Self Care Barriers to Discharge: No Barriers Identified   Patient Goals and CMS Choice Patient states their goals for this hospitalization and ongoing recovery are:: get better   Choice offered to / list presented to : NA  Discharge Placement                       Discharge Plan and Services In-house Referral: NA Discharge Planning Services: CM Consult Post Acute Care Choice: NA          DME Arranged: (NA)         HH Arranged: NA          Social Determinants of Health (SDOH) Interventions     Readmission Risk Interventions Readmission Risk Prevention Plan 01/20/2019  Transportation Screening Complete  PCP or Specialist Appt within 3-5 Days Complete  HRI or [place] Complete  Social Work Consult for [place] Planning/Counseling Complete  Palliative Care Screening Not Applicable  Medication Review Press photographer) Complete  Some recent data might be hidden

## 2019-01-20 NOTE — Progress Notes (Addendum)
[facility] NEPHROLOGY PROGRESS NOTE  Assessment/ Plan: Pt is a 78 y.o. 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 Al Corpus at [facility] admitted with worsening renal failure.  #CKD 4 now progressed to ESRD: Patient with uremic symptoms and therefore started dialysis since 6/26 after [facility] placement by IR.  Patient wants to follow-up with her nephrologist for home therapy therefore no permanent access placed.  She is now on MWF schedule.  Last dialysis yesterday tolerated well.  Plan for next treatment tomorrow.  Outpatient dialysis arrangement ongoing.  #Hypertension/volume: Blood pressure acceptable.  Currently on Coreg, Procardia and hydralazine.  Volume status acceptable.  #Anemia of CKD received IV iron and Aranesp.  Start weekly iron and ESA.  Monitor CBC.  Previously he used to follow with oncology.  #Secondary hyperparathyroidism, hyperphosphatemia: PTH 152.  #Rheumatoid arthritis: On prednisone taper per primary team.  Addendum: Outpatient HD unit arranged at [facility] starting tomorrow. Patient can be discharged from renal perspective. Discussed with primary team.  Subjective: Seen and examined at bedside.  Reported feeling good.  Denies headache, dizziness, nausea, vomiting, chest pain, shortness of breath. Objective Vital signs in last 24 hours: Vitals:   01/19/19 1450 01/19/19 2029 01/20/19 0426 01/20/19 0744  BP: (!) 143/68 (!) 157/75 136/65 (!) 143/76  Pulse: 73 98 86 80  Resp: 18 18 17 18   Temp: (!) 97.5 F (36.4 C) 99.1 F (37.3 C) 98.2 F (36.8 C) 98.6 F (37 C)  TempSrc: Oral Oral  Oral  SpO2: 99% 97% 97% 98%  Weight:  56.4 kg    Height:       Weight change: 0.007 kg  Intake/Output Summary (Last 24 hours) at 01/20/2019 1408 Last data filed at 01/20/2019 1212 Gross per 24 hour  Intake 1060 ml  Output 600 ml  Net 460 ml       Labs: Basic Metabolic Panel: Recent Labs  Lab 01/18/19 0543  01/19/19 0750 01/20/19 0608  NA 137 136 136  K 3.8 3.2* 4.1  CL 99 101 103  CO2 24 23 24   GLUCOSE 90 109* 107*  BUN 39* 57* 39*  CREATININE 4.13* 5.34* 4.22*  CALCIUM 7.2* 7.5* 7.4*  PHOS 4.3 3.8 3.0   Liver Function Tests: Recent Labs  Lab 01/15/19 1423  01/18/19 0543 01/19/19 0750 01/20/19 0608  AST 25  --   --   --   --   ALT 22  --   --   --   --   ALKPHOS 50  --   --   --   --   BILITOT 0.5  --   --   --   --   PROT 5.2*  --   --   --   --   ALBUMIN 2.4*   < > 2.1* 2.0* 2.0*   < > = values in this interval not displayed.   No results for input(s): LIPASE, AMYLASE in the last 168 hours. No results for input(s): AMMONIA in the last 168 hours. CBC: Recent Labs  Lab 01/15/19 1423 01/16/19 0312 01/19/19 0750  WBC 9.8 7.4 14.9*  NEUTROABS 7.3  --   --   HGB 9.3* 8.5* 9.5*  HCT 29.5* 26.4* 29.7*  MCV 95.8 93.3 96.1  PLT 251 228 231   Cardiac Enzymes: Recent Labs  Lab 01/15/19 1423  CKTOTAL 100   CBG: No results for input(s): GLUCAP in the last 168 hours.  Iron Studies: No results  for input(s): IRON, TIBC, TRANSFERRIN, FERRITIN in the last 72 hours. Studies/Results: No results found.  Medications: Infusions: .  ceFAZolin (ANCEF) IV      Scheduled Medications: . calcitRIOL  0.25 mcg Oral Daily  . calcium acetate  667 mg Oral TID WC  . calcium carbonate  200 mg of elemental calcium Oral BID WC  . carvedilol  12.5 mg Oral BID WC  . Chlorhexidine Gluconate Cloth  6 each Topical Q0600  . feeding supplement (NEPRO CARB STEADY)  237 mL Oral TID WC  . hydrALAZINE  50 mg Oral BID  . multivitamin  1 tablet Oral QHS  . NIFEdipine  30 mg Oral Daily  . pantoprazole  40 mg Oral Daily  . predniSONE  25 mg Oral Q breakfast   Followed by  . [START ON 01/23/2019] predniSONE  20 mg Oral Q breakfast   Followed by  . [START ON 01/28/2019] predniSONE  15 mg Oral Q breakfast   Followed by  . [START ON 02/02/2019] predniSONE  10 mg Oral Q breakfast   Followed by  .  [START ON 02/07/2019] predniSONE  5 mg Oral Q breakfast    have reviewed scheduled and prn medications.  Physical Exam: General:NAD, comfortable Heart:RRR, s1s2 nl, no rubs Lungs:clear b/l, no crackle Abdomen:soft, Non-tender, non-distended Extremities:No edema Dialysis Access: Right IJ tunnel catheter.  Dron Prasad Bhandari 01/20/2019,2:08 PM  LOS: 5 days  Pager: [phone]

## 2019-01-26 ENCOUNTER — Other Ambulatory Visit (HOSPITAL_COMMUNITY): Payer: Medicare Other

## 2019-01-26 ENCOUNTER — Ambulatory Visit (HOSPITAL_COMMUNITY): Payer: Medicare Other

## 2019-02-05 ENCOUNTER — Other Ambulatory Visit (HOSPITAL_COMMUNITY): Payer: Medicare Other

## 2019-02-05 ENCOUNTER — Ambulatory Visit (HOSPITAL_COMMUNITY): Payer: Medicare Other | Admitting: Hematology

## 2019-02-05 ENCOUNTER — Ambulatory Visit (HOSPITAL_COMMUNITY): Payer: Medicare Other

## 2019-02-25 ENCOUNTER — Ambulatory Visit (HOSPITAL_COMMUNITY): Payer: Medicare Other

## 2019-02-25 ENCOUNTER — Other Ambulatory Visit (HOSPITAL_COMMUNITY): Payer: Medicare Other

## 2019-02-26 ENCOUNTER — Ambulatory Visit (HOSPITAL_COMMUNITY): Payer: Medicare Other

## 2019-02-26 ENCOUNTER — Other Ambulatory Visit (HOSPITAL_COMMUNITY): Payer: Medicare Other

## 2019-03-03 ENCOUNTER — Other Ambulatory Visit: Payer: Self-pay

## 2019-03-03 DIAGNOSIS — N184 Chronic kidney disease, stage 4 (severe): Secondary | ICD-10-CM

## 2019-03-12 ENCOUNTER — Ambulatory Visit (HOSPITAL_COMMUNITY)
Admission: RE | Admit: 2019-03-12 | Discharge: 2019-03-12 | Disposition: A | Discharge status: Home / Self Care | Payer: Medicare Other | Source: Ambulatory Visit | Attending: Vascular Surgery | Admitting: Vascular Surgery

## 2019-03-12 ENCOUNTER — Ambulatory Visit (INDEPENDENT_AMBULATORY_CARE_PROVIDER_SITE_OTHER): Payer: Medicare Other | Admitting: Vascular Surgery

## 2019-03-12 ENCOUNTER — Other Ambulatory Visit: Payer: Self-pay

## 2019-03-12 ENCOUNTER — Encounter: Payer: Self-pay | Admitting: Vascular Surgery

## 2019-03-12 ENCOUNTER — Ambulatory Visit (INDEPENDENT_AMBULATORY_CARE_PROVIDER_SITE_OTHER)
Admission: RE | Admit: 2019-03-12 | Discharge: 2019-03-12 | Disposition: A | Discharge status: Home / Self Care | Payer: Medicare Other | Source: Ambulatory Visit | Attending: Vascular Surgery | Admitting: Vascular Surgery

## 2019-03-12 VITALS — BP 138/80 | HR 78 | Temp 97.9°F | Resp 14 | Ht 59.0 in | Wt 119.0 lb

## 2019-03-12 DIAGNOSIS — N184 Chronic kidney disease, stage 4 (severe): Secondary | ICD-10-CM

## 2019-03-12 DIAGNOSIS — N186 End stage renal disease: Secondary | ICD-10-CM

## 2019-03-12 DIAGNOSIS — Z992 Dependence on renal dialysis: Secondary | ICD-10-CM

## 2019-03-12 NOTE — Progress Notes (Signed)
Referring Physician: Dr. Lowanda Foster  Patient name: Felicia Williams MRN: [id number] DOB: 05/19/1941 Sex: female  REASON FOR CONSULT: Hemodialysis access  HPI: Felicia Williams is a 78 y.o. female, who has end-stage renal disease..  She had a peritoneal dialysis catheter placed yesterday.  Apparently this is going to be ready for use in 2 weeks.  He is sent today for evaluation for placement of a long-term hemodialysis access.  She currently has a dialysis catheter on the right side.  Her dialysis today is Monday Wednesday Friday.  Other medical problems include hypertension and hyperlipidemia both are currently stable.  Past Medical History:  Diagnosis Date  . Anemia in stage 4 chronic kidney disease ([place]) 08/18/2018  . Depression   . Essential hypertension, benign   . GERD (gastroesophageal reflux disease)   . Glomerulonephritis   . Gout   . Mixed hyperlipidemia   . Renal insufficiency    Past Surgical History:  Procedure Laterality Date  . APPENDECTOMY    . CATARACT EXTRACTION    . COLONOSCOPY N/A 12/28/2015   Procedure: COLONOSCOPY;  Surgeon: Rogene Houston, MD;  Location: [facility];  Service: Endoscopy;  Laterality: N/A;  815  . IR FLUORO GUIDE CV LINE RIGHT  01/16/2019  . IR US GUIDE VASC ACCESS RIGHT  01/16/2019  . TUBAL LIGATION      Family History  Problem Relation Age of Onset  . CAD Father   . Heart attack Father   . Diabetes Mellitus II Father   . Hypertension Father   . Lupus Brother     SOCIAL HISTORY: Social History   Socioeconomic History  . Marital status: Single    Spouse name: Not on file  . Number of children: Not on file  . Years of education: Not on file  . Highest education level: Not on file  Occupational History  . Not on file  Social Needs  . Financial resource strain: Not on file  . Food insecurity    Worry: Not on file    Inability: Not on file  . Transportation needs    Medical: Not on file    Non-medical: Not on file  Tobacco  Use  . Smoking status: Never Smoker  . Smokeless tobacco: Never Used  Substance and Sexual Activity  . Alcohol use: No  . Drug use: No  . Sexual activity: Not Currently  Lifestyle  . Physical activity    Days per week: Not on file    Minutes per session: Not on file  . Stress: Not on file  Relationships  . Social Herbalist on phone: Not on file    Gets together: Not on file    Attends religious service: Not on file    Active member of club or organization: Not on file    Attends meetings of clubs or organizations: Not on file    Relationship status: Not on file  . Intimate partner violence    Fear of current or ex partner: Not on file    Emotionally abused: Not on file    Physically abused: Not on file    Forced sexual activity: Not on file  Other Topics Concern  . Not on file  Social History Narrative  . Not on file    Allergies  Allergen Reactions  . Sulfa Antibiotics Other (See Comments)    Shut pt's kidneys down    Current Outpatient Medications  Medication Sig Dispense Refill  . calcium carbonate (  TUMS - DOSED IN MG ELEMENTAL CALCIUM) 500 MG chewable tablet Chew 1 tablet (200 mg of elemental calcium total) by mouth 2 (two) times daily with a meal. 60 tablet 0  . carvedilol (COREG) 12.5 MG tablet Take 12.5 mg by mouth 2 (two) times daily with a meal.     . hydrALAZINE (APRESOLINE) 50 MG tablet Take 50 mg by mouth 2 (two) times a day.     . loperamide (IMODIUM) 2 MG capsule Take 1 capsule (2 mg total) by mouth as needed for diarrhea or loose stools. 30 capsule 0  . multivitamin (RENA-VIT) TABS tablet Take 1 tablet by mouth at bedtime. 30 tablet 0  . NIFEdipine (ADALAT CC) 30 MG 24 hr tablet Take 1 tablet (30 mg total) by mouth daily. 30 tablet 0  . ondansetron (ZOFRAN-ODT) 4 MG disintegrating tablet Take 1 tablet (4 mg total) by mouth every 8 (eight) hours as needed for nausea or vomiting. 20 tablet 0  . pantoprazole (PROTONIX) 40 MG tablet Take 40 mg by  mouth daily.     . Vitamin D, Cholecalciferol, 50 MCG (2000 UT) CAPS Take 2,000 Units by mouth daily.      No current facility-administered medications for this visit.     ROS:   General:  No weight loss, Fever, chills  HEENT: No recent headaches, no nasal bleeding, no visual changes, no sore throat  Neurologic: No dizziness, blackouts, seizures. No recent symptoms of stroke or mini- stroke. No recent episodes of slurred speech, or temporary blindness.  Cardiac: No recent episodes of chest pain/pressure, no shortness of breath at rest.  No shortness of breath with exertion.  Denies history of atrial fibrillation or irregular heartbeat  Vascular: No history of rest pain in feet.  No history of claudication.  No history of non-healing ulcer, No history of DVT   Pulmonary: No home oxygen, no productive cough, no hemoptysis,  No asthma or wheezing  Musculoskeletal:  [ ]  Arthritis, [ ]  Low back pain,  [ ]  Joint pain  Hematologic:No history of hypercoagulable state.  No history of easy bleeding.  No history of anemia  Gastrointestinal: No hematochezia or melena,  No gastroesophageal reflux, no trouble swallowing  Urinary: [X]  chronic Kidney disease, [X]  on HD - [X]  MWF or [ ]  TTHS, [ ]  Burning with urination, [ ]  Frequent urination, [ ]  Difficulty urinating;   Skin: No rashes  Psychological: No history of anxiety,  No history of depression   Physical Examination  Vitals:   03/12/19 0905  BP: 138/80  Pulse: 78  Resp: 14  Temp: 97.9 F (36.6 C)  TempSrc: Temporal  SpO2: 97%  Weight: 119 lb (54 kg)  Height: 4\' 11"  (1.499 m)    Body mass index is 24.04 kg/m.  General:  Alert and oriented, no acute distress HEENT: Normal Neck: No JVD Pulmonary: Clear to auscultation bilaterally Cardiac: Regular Rate and Rhythm  Skin: No rash Extremity Pulses:  2+ radial, brachial pulses bilaterally Musculoskeletal: No deformity or edema  Neurologic: Upper and lower extremity motor 5/5  and symmetric  DATA:  Patient had a vein mapping ultrasound today which shows an adequate vein in either upper extremity for creation of a fistula.  She did have an adequate brachial artery of 4 mm diameter.  I reviewed and interpreted both of the studies.  ASSESSMENT: Patient would not be a candidate for an AV fistula due to small veins.  She would be a candidate for placement of an AV graft.  However I am reluctant to proceed with placing an AV graft that we would be maintaining while she is on peritoneal dialysis.  I believe ultimately the graft may fail and maintenance work on the graft would be done for a graft that is not in use.   PLAN: I will copy Dr Hinda Lenis on this note.  If she is going to proceed with peritoneal dialysis I would prefer to defer placement of any AV graft for failure of peritoneal dialysis rather than having the graft as a backup access.  I am concerned that we would be maintained in a graft is not currently in use.  I will await word on whether or not Dr Hinda Lenis wishes [place] to place an AV graft.   Ruta Hinds, MD [facility] Office: [phone] Pager: [phone]

## 2019-06-11 ENCOUNTER — Other Ambulatory Visit (HOSPITAL_COMMUNITY): Payer: Self-pay | Admitting: Nephrology

## 2019-06-11 DIAGNOSIS — Z992 Dependence on renal dialysis: Secondary | ICD-10-CM

## 2019-06-11 DIAGNOSIS — N186 End stage renal disease: Secondary | ICD-10-CM

## 2019-06-25 ENCOUNTER — Encounter (HOSPITAL_COMMUNITY): Payer: Self-pay

## 2019-06-25 ENCOUNTER — Ambulatory Visit (HOSPITAL_COMMUNITY): Payer: Medicare Other

## 2019-07-01 ENCOUNTER — Encounter (HOSPITAL_COMMUNITY): Payer: Self-pay | Admitting: Student

## 2019-07-01 ENCOUNTER — Other Ambulatory Visit: Payer: Self-pay

## 2019-07-01 ENCOUNTER — Ambulatory Visit (HOSPITAL_COMMUNITY)
Admission: RE | Admit: 2019-07-01 | Discharge: 2019-07-01 | Disposition: A | Discharge status: Home / Self Care | Payer: Medicare Other | Source: Ambulatory Visit | Attending: Nephrology | Admitting: Nephrology

## 2019-07-01 DIAGNOSIS — N186 End stage renal disease: Secondary | ICD-10-CM | POA: Diagnosis not present

## 2019-07-01 DIAGNOSIS — Z4901 Encounter for fitting and adjustment of extracorporeal dialysis catheter: Secondary | ICD-10-CM | POA: Diagnosis present

## 2019-07-01 DIAGNOSIS — Z992 Dependence on renal dialysis: Secondary | ICD-10-CM

## 2019-07-01 HISTORY — PX: IR REMOVAL TUN CV CATH W/O FL: IMG2289

## 2019-07-01 MED ORDER — CHLORHEXIDINE GLUCONATE 4 % EX LIQD
CUTANEOUS | Status: DC | PRN
  Administered 2019-07-01: 1 via TOPICAL

## 2019-07-01 MED ORDER — LIDOCAINE HCL 1 % IJ SOLN
INTRAMUSCULAR | Status: AC
  Filled 2019-07-01: qty 20

## 2019-07-01 MED ORDER — CHLORHEXIDINE GLUCONATE 4 % EX LIQD
CUTANEOUS | Status: AC
  Filled 2019-07-01: qty 15

## 2019-07-01 MED ORDER — LIDOCAINE HCL (PF) 1 % IJ SOLN
INTRAMUSCULAR | Status: DC | PRN
  Administered 2019-07-01: 10 mL

## 2020-11-14 ENCOUNTER — Encounter (INDEPENDENT_AMBULATORY_CARE_PROVIDER_SITE_OTHER): Payer: Self-pay | Admitting: Internal Medicine

## 2020-11-14 ENCOUNTER — Other Ambulatory Visit (INDEPENDENT_AMBULATORY_CARE_PROVIDER_SITE_OTHER): Payer: Self-pay

## 2020-11-14 ENCOUNTER — Encounter (INDEPENDENT_AMBULATORY_CARE_PROVIDER_SITE_OTHER): Payer: Self-pay | Admitting: *Deleted

## 2020-11-14 ENCOUNTER — Other Ambulatory Visit (INDEPENDENT_AMBULATORY_CARE_PROVIDER_SITE_OTHER): Payer: Self-pay | Admitting: *Deleted

## 2020-11-14 ENCOUNTER — Other Ambulatory Visit: Payer: Self-pay

## 2020-11-14 ENCOUNTER — Ambulatory Visit (INDEPENDENT_AMBULATORY_CARE_PROVIDER_SITE_OTHER): Payer: Medicare Other | Admitting: Internal Medicine

## 2020-11-14 DIAGNOSIS — K219 Gastro-esophageal reflux disease without esophagitis: Secondary | ICD-10-CM | POA: Diagnosis not present

## 2020-11-14 DIAGNOSIS — R112 Nausea with vomiting, unspecified: Secondary | ICD-10-CM | POA: Diagnosis not present

## 2020-11-14 MED ORDER — ESOMEPRAZOLE MAGNESIUM 40 MG PO CPDR
40.0000 mg | DELAYED_RELEASE_CAPSULE | Freq: Every day | ORAL | 5 refills | Status: DC
Start: 2020-11-14 — End: 2021-01-24

## 2020-11-14 MED ORDER — FAMOTIDINE 20 MG PO TABS: 20.0000 mg | ORAL_TABLET | Freq: Every day | ORAL | Status: DC

## 2020-11-14 NOTE — Patient Instructions (Signed)
Esophagogastroduode

## 2020-11-14 NOTE — H&P (View-Only) (Signed)
Presenting complaint;  Nausea vomiting daily heartburn and epigastric pain.  History of present illness  Patient is 80 year old Caucasian female who is referred through courtesy of of her nephrologist Dr. Jerl Mina for GI evaluation.  She is accompanied by her daughter. Patient reports her nausea and vomiting started about 4 months ago after she would swallow phosphate binder but she got better with the symptoms returned about 2 weeks ago and she has been vomiting almost daily at times more than once a day.  She has been vomiting recently eaten food and denies hematemesis.  She has been on pantoprazole for at least 5 years for heartburn but is not working anymore.  She has daily heartburn as well as epigastric pain.  She also has frequent regurgitation.  She also complains of painful swallowing.  She has not had an episode of dysphagia or food impaction.  Her appetite is not good.  She has lost 10 pounds in 2 weeks.  Her bowels move daily.  She denies diarrhea constipation melena or rectal bleeding. She feels ondansetron is helping with nausea. She has chronic back pain.  She states she has due to arthritis and osteoporosis.  She takes pain medication daily.  She did receive a single dose of Prolia but was discontinued because of her kidney disease. She has been on peritoneal dialysis for about 2 years. No history of peptic ulcer disease.  She does not take NSAIDs.  She had normal screening colonoscopy in June 2017.  Current Medications: Outpatient Encounter Medications as of 11/14/2020  Medication Sig  . Calcium Acetate, Phos Binder, (PHOSLYRA PO) Take 10 mLs by mouth 3 (three) times daily with meals.  . calcium carbonate (TUMS - DOSED IN MG ELEMENTAL CALCIUM) 500 MG chewable tablet Chew 1 tablet (200 mg of elemental calcium total) by mouth 2 (two) times daily with a meal.  . carvedilol (COREG) 12.5 MG tablet Take 12.5 mg by mouth 2 (two) times daily with a meal.   . hydrALAZINE  (APRESOLINE) 50 MG tablet Take 50 mg by mouth 2 (two) times a day.   Marland Kitchen HYDROcodone-acetaminophen (NORCO/VICODIN) 5-325 MG tablet Take 1 tablet by mouth every 6 (six) hours as needed for moderate pain.  . multivitamin (RENA-VIT) TABS tablet Take 1 tablet by mouth at bedtime.  . ondansetron (ZOFRAN) 8 MG tablet Take 8 mg by mouth 2 (two) times daily.  . ondansetron (ZOFRAN-ODT) 4 MG disintegrating tablet Take 1 tablet (4 mg total) by mouth every 8 (eight) hours as needed for nausea or vomiting.  . pantoprazole (PROTONIX) 40 MG tablet Take 20 mg by mouth daily.  . potassium chloride (KLOR-CON) 10 MEQ tablet Take 10 mEq by mouth 2 (two) times daily.  Marland Kitchen rOPINIRole (REQUIP) 0.5 MG tablet Take 0.5 mg by mouth 3 (three) times daily.  . Vitamin D, Cholecalciferol, 50 MCG (2000 UT) CAPS Take 2,000 Units by mouth daily.   Marland Kitchen loperamide (IMODIUM) 2 MG capsule Take 1 capsule (2 mg total) by mouth as needed for diarrhea or loose stools. (Patient not taking: Reported on 11/14/2020)  . NIFEdipine (ADALAT CC) 30 MG 24 hr tablet Take 1 tablet (30 mg total) by mouth daily. (Patient not taking: Reported on 11/14/2020)   No facility-administered encounter medications on file as of 11/14/2020.   Past Medical History:  Diagnosis Date  . Anemia in stage 4 chronic kidney disease ([place]) 08/18/2018  . Depression   . Essential hypertension, benign   . GERD (gastroesophageal reflux disease)   . Glomerulonephritis   .  Gout   . Mixed hyperlipidemia   . Renal insufficiency    Past Surgical History:  Procedure Laterality Date  . APPENDECTOMY at age 65.    . CATARACT EXTRACTION    . COLONOSCOPY N/A 12/28/2015   Procedure: COLONOSCOPY;  Surgeon: Rogene Houston, MD;  Location: [facility];  Service: Endoscopy;  Laterality: N/A;  815  . IR FLUORO GUIDE CV LINE RIGHT  01/16/2019  . IR REMOVAL TUN CV CATH W/O FL  07/01/2019  . IR US GUIDE VASC ACCESS RIGHT  01/16/2019  . TUBAL LIGATION 54 years ago.          Small benign tumor  removed from abdominal wall more than 30 years ago.  Allergies  Allergies  Allergen Reactions  . Sulfa Antibiotics Other (See Comments)    Shut pt's kidneys down    Family history  Father had coronary artery disease.  He died of MI at age 93. Mother has COPD and lived to be 6. She has 1 brother age 80 who has coronary artery disease status post CABG and stenting who also has a pacemaker as well as rheumatoid arthritis and psoriatic arthritis.  Social history  Patient is widowed.  She has 2 grownup children.  Her son has insulin-dependent diabetes mellitus.  Daughter has seizure disorder and a small hemangioma.  She lost 1 son of spina bifida at age 63 months.  She worked at [employer] for 15 years.  She has never smoked cigarettes and does not drink alcohol.  Physical examination  Blood pressure (!) 151/87, pulse (!) 109, temperature 98.1 F (36.7 C), temperature source Oral, height '4\' 11"'$  (1.499 m), weight 120 lb 9.6 oz (54.7 kg). Patient is alert and in no acute distress. She appears younger than stated age. She is wearing a mask. Conjunctiva is pink. Sclera is nonicteric Oropharyngeal mucosa is normal. No neck masses or thyromegaly noted. Cardiac exam with regular rhythm normal S1 and S2. No murmur or gallop noted. Lungs are clear to auscultation. Abdomen is full.  She has PD cannula entry site in right upper quadrant.  She has 2 small scars across upper abdomen.  Bowel sounds are normal.  On palpation abdomen is soft.  She has mild tenderness in midepigastric region. No LE edema or clubbing noted.  Labs/studies Results: No recent lab data on file.  Ultrasound in December 2016 negative for cholelithiasis wall thickening or dilated bile duct.  Assessment:  #1.  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  No history of hematemesis or melena.  Her GERD symptoms are not controlled anymore with pantoprazole.  She may have underlying peptic ulcer disease or gastroparesis.  Doubt biliary tract disease.  She would benefit from diagnostic esophagogastroduodenoscopy as soon as possible.  #2.  Patient is average risk for CRC and up-to-date on screening protocol.   Recommendations  Medication list updated. Discontinue pantoprazole. Begin esomeprazole 40 mg by mouth 30 minutes before breakfast daily. Pepcid/famotidine OTC 20 mg daily at bedtime or after evening meal. Patient advised to eat small meals and limit intake of fatty and fried foods and caffeine containing products. Request copy of recent blood work from Dr. Elwyn Lade office. Diagnostic esophagogastroduodenoscopy in near future.

## 2020-11-14 NOTE — Progress Notes (Signed)
Presenting complaint;  Nausea vomiting daily heartburn and epigastric pain.  History of present illness  Patient is 80 year old Caucasian female who is referred through courtesy of of her nephrologist Dr. Jerl Mina for GI evaluation.  She is accompanied by her daughter. Patient reports her nausea and vomiting started about 4 months ago after she would swallow phosphate binder but she got better with the symptoms returned about 2 weeks ago and she has been vomiting almost daily at times more than once a day.  She has been vomiting recently eaten food and denies hematemesis.  She has been on pantoprazole for at least 5 years for heartburn but is not working anymore.  She has daily heartburn as well as epigastric pain.  She also has frequent regurgitation.  She also complains of painful swallowing.  She has not had an episode of dysphagia or food impaction.  Her appetite is not good.  She has lost 10 pounds in 2 weeks.  Her bowels move daily.  She denies diarrhea constipation melena or rectal bleeding. She feels ondansetron is helping with nausea. She has chronic back pain.  She states she has due to arthritis and osteoporosis.  She takes pain medication daily.  She did receive a single dose of Prolia but was discontinued because of her kidney disease. She has been on peritoneal dialysis for about 2 years. No history of peptic ulcer disease.  She does not take NSAIDs.  She had normal screening colonoscopy in June 2017.  Current Medications: Outpatient Encounter Medications as of 11/14/2020  Medication Sig  . Calcium Acetate, Phos Binder, (PHOSLYRA PO) Take 10 mLs by mouth 3 (three) times daily with meals.  . calcium carbonate (TUMS - DOSED IN MG ELEMENTAL CALCIUM) 500 MG chewable tablet Chew 1 tablet (200 mg of elemental calcium total) by mouth 2 (two) times daily with a meal.  . carvedilol (COREG) 12.5 MG tablet Take 12.5 mg by mouth 2 (two) times daily with a meal.   . hydrALAZINE  (APRESOLINE) 50 MG tablet Take 50 mg by mouth 2 (two) times a day.   Marland Kitchen HYDROcodone-acetaminophen (NORCO/VICODIN) 5-325 MG tablet Take 1 tablet by mouth every 6 (six) hours as needed for moderate pain.  . multivitamin (RENA-VIT) TABS tablet Take 1 tablet by mouth at bedtime.  . ondansetron (ZOFRAN) 8 MG tablet Take 8 mg by mouth 2 (two) times daily.  . ondansetron (ZOFRAN-ODT) 4 MG disintegrating tablet Take 1 tablet (4 mg total) by mouth every 8 (eight) hours as needed for nausea or vomiting.  . pantoprazole (PROTONIX) 40 MG tablet Take 20 mg by mouth daily.  . potassium chloride (KLOR-CON) 10 MEQ tablet Take 10 mEq by mouth 2 (two) times daily.  Marland Kitchen rOPINIRole (REQUIP) 0.5 MG tablet Take 0.5 mg by mouth 3 (three) times daily.  . Vitamin D, Cholecalciferol, 50 MCG (2000 UT) CAPS Take 2,000 Units by mouth daily.   Marland Kitchen loperamide (IMODIUM) 2 MG capsule Take 1 capsule (2 mg total) by mouth as needed for diarrhea or loose stools. (Patient not taking: Reported on 11/14/2020)  . NIFEdipine (ADALAT CC) 30 MG 24 hr tablet Take 1 tablet (30 mg total) by mouth daily. (Patient not taking: Reported on 11/14/2020)   No facility-administered encounter medications on file as of 11/14/2020.   Past Medical History:  Diagnosis Date  . Anemia in stage 4 chronic kidney disease ([place]) 08/18/2018  . Depression   . Essential hypertension, benign   . GERD (gastroesophageal reflux disease)   . Glomerulonephritis   .  Gout   . Mixed hyperlipidemia   . Renal insufficiency    Past Surgical History:  Procedure Laterality Date  . APPENDECTOMY at age 25.    . CATARACT EXTRACTION    . COLONOSCOPY N/A 12/28/2015   Procedure: COLONOSCOPY;  Surgeon: Rogene Houston, MD;  Location: [facility];  Service: Endoscopy;  Laterality: N/A;  815  . IR FLUORO GUIDE CV LINE RIGHT  01/16/2019  . IR REMOVAL TUN CV CATH W/O FL  07/01/2019  . IR US GUIDE VASC ACCESS RIGHT  01/16/2019  . TUBAL LIGATION 54 years ago.          Small benign tumor  removed from abdominal wall more than 30 years ago.  Allergies  Allergies  Allergen Reactions  . Sulfa Antibiotics Other (See Comments)    Shut pt's kidneys down    Family history  Father had coronary artery disease.  He died of MI at age 26. Mother has COPD and lived to be 20. She has 1 brother age 64 who has coronary artery disease status post CABG and stenting who also has a pacemaker as well as rheumatoid arthritis and psoriatic arthritis.  Social history  Patient is widowed.  She has 2 grownup children.  Her son has insulin-dependent diabetes mellitus.  Daughter has seizure disorder and a small hemangioma.  She lost 1 son of spina bifida at age 61 months.  She worked at [employer] for 15 years.  She has never smoked cigarettes and does not drink alcohol.  Physical examination  Blood pressure (!) 151/87, pulse (!) 109, temperature 98.1 F (36.7 C), temperature source Oral, height '4\' 11"'$  (1.499 m), weight 120 lb 9.6 oz (54.7 kg). Patient is alert and in no acute distress. She appears younger than stated age. She is wearing a mask. Conjunctiva is pink. Sclera is nonicteric Oropharyngeal mucosa is normal. No neck masses or thyromegaly noted. Cardiac exam with regular rhythm normal S1 and S2. No murmur or gallop noted. Lungs are clear to auscultation. Abdomen is full.  She has PD cannula entry site in right upper quadrant.  She has 2 small scars across upper abdomen.  Bowel sounds are normal.  On palpation abdomen is soft.  She has mild tenderness in midepigastric region. No LE edema or clubbing noted.  Labs/studies Results: No recent lab data on file.  Ultrasound in December 2016 negative for cholelithiasis wall thickening or dilated bile duct.  Assessment:  #1.  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  No history of hematemesis or melena.  Her GERD symptoms are not controlled anymore with pantoprazole.  She may have underlying peptic ulcer disease or gastroparesis.  Doubt biliary tract disease.  She would benefit from diagnostic esophagogastroduodenoscopy as soon as possible.  #2.  Patient is average risk for CRC and up-to-date on screening protocol.   Recommendations  Medication list updated. Discontinue pantoprazole. Begin esomeprazole 40 mg by mouth 30 minutes before breakfast daily. Pepcid/famotidine OTC 20 mg daily at bedtime or after evening meal. Patient advised to eat small meals and limit intake of fatty and fried foods and caffeine containing products. Request copy of recent blood work from Dr. Elwyn Lade office. Diagnostic esophagogastroduodenoscopy in near future.

## 2020-11-15 ENCOUNTER — Other Ambulatory Visit (HOSPITAL_COMMUNITY)
Admission: RE | Admit: 2020-11-15 | Discharge: 2020-11-15 | Disposition: A | Discharge status: Home / Self Care | Payer: Medicare Other | Source: Ambulatory Visit | Attending: Internal Medicine | Admitting: Internal Medicine

## 2020-11-15 ENCOUNTER — Other Ambulatory Visit: Payer: Self-pay

## 2020-11-15 DIAGNOSIS — Z01812 Encounter for preprocedural laboratory examination: Secondary | ICD-10-CM | POA: Diagnosis present

## 2020-11-15 DIAGNOSIS — Z20822 Contact with and (suspected) exposure to covid-19: Secondary | ICD-10-CM | POA: Diagnosis not present

## 2020-11-15 LAB — SARS CORONAVIRUS 2 BY RT PCR (HOSPITAL ORDER, PERFORMED IN ~~LOC~~ HOSPITAL LAB): SARS Coronavirus 2: NEGATIVE

## 2020-11-16 ENCOUNTER — Encounter (HOSPITAL_COMMUNITY)
Admission: RE | Disposition: A | Discharge status: Home / Self Care | Payer: Self-pay | Source: Home / Self Care | Attending: Internal Medicine

## 2020-11-16 ENCOUNTER — Telehealth (INDEPENDENT_AMBULATORY_CARE_PROVIDER_SITE_OTHER): Payer: Self-pay | Admitting: *Deleted

## 2020-11-16 ENCOUNTER — Ambulatory Visit (HOSPITAL_COMMUNITY): Payer: Medicare Other | Admitting: Anesthesiology

## 2020-11-16 ENCOUNTER — Ambulatory Visit (HOSPITAL_COMMUNITY)
Admission: RE | Admit: 2020-11-16 | Discharge: 2020-11-16 | Disposition: A | Discharge status: Home / Self Care | Payer: Medicare Other | Attending: Internal Medicine | Admitting: Internal Medicine

## 2020-11-16 ENCOUNTER — Other Ambulatory Visit (HOSPITAL_COMMUNITY)
Admission: RE | Admit: 2020-11-16 | Discharge: 2020-11-16 | Disposition: A | Discharge status: Home / Self Care | Payer: Medicare Other | Source: Ambulatory Visit | Attending: Internal Medicine | Admitting: Internal Medicine

## 2020-11-16 ENCOUNTER — Other Ambulatory Visit (INDEPENDENT_AMBULATORY_CARE_PROVIDER_SITE_OTHER): Payer: Self-pay

## 2020-11-16 ENCOUNTER — Encounter (HOSPITAL_COMMUNITY): Payer: Self-pay | Admitting: Internal Medicine

## 2020-11-16 DIAGNOSIS — Z992 Dependence on renal dialysis: Secondary | ICD-10-CM | POA: Insufficient documentation

## 2020-11-16 DIAGNOSIS — Z79899 Other long term (current) drug therapy: Secondary | ICD-10-CM | POA: Diagnosis not present

## 2020-11-16 DIAGNOSIS — K219 Gastro-esophageal reflux disease without esophagitis: Secondary | ICD-10-CM

## 2020-11-16 DIAGNOSIS — Z9049 Acquired absence of other specified parts of digestive tract: Secondary | ICD-10-CM | POA: Diagnosis not present

## 2020-11-16 DIAGNOSIS — R112 Nausea with vomiting, unspecified: Secondary | ICD-10-CM

## 2020-11-16 DIAGNOSIS — I120 Hypertensive chronic kidney disease with stage 5 chronic kidney disease or end stage renal disease: Secondary | ICD-10-CM | POA: Insufficient documentation

## 2020-11-16 DIAGNOSIS — N186 End stage renal disease: Secondary | ICD-10-CM | POA: Diagnosis not present

## 2020-11-16 DIAGNOSIS — K209 Esophagitis, unspecified without bleeding: Secondary | ICD-10-CM

## 2020-11-16 DIAGNOSIS — K449 Diaphragmatic hernia without obstruction or gangrene: Secondary | ICD-10-CM | POA: Diagnosis not present

## 2020-11-16 DIAGNOSIS — K2100 Gastro-esophageal reflux disease with esophagitis, without bleeding: Secondary | ICD-10-CM | POA: Diagnosis not present

## 2020-11-16 DIAGNOSIS — K222 Esophageal obstruction: Secondary | ICD-10-CM | POA: Diagnosis not present

## 2020-11-16 DIAGNOSIS — R1013 Epigastric pain: Secondary | ICD-10-CM | POA: Diagnosis present

## 2020-11-16 DIAGNOSIS — K317 Polyp of stomach and duodenum: Secondary | ICD-10-CM | POA: Insufficient documentation

## 2020-11-16 DIAGNOSIS — Z882 Allergy status to sulfonamides status: Secondary | ICD-10-CM | POA: Diagnosis not present

## 2020-11-16 DIAGNOSIS — R12 Heartburn: Secondary | ICD-10-CM | POA: Diagnosis present

## 2020-11-16 DIAGNOSIS — G8929 Other chronic pain: Secondary | ICD-10-CM | POA: Diagnosis not present

## 2020-11-16 HISTORY — PX: POLYPECTOMY: SHX5525

## 2020-11-16 HISTORY — PX: ESOPHAGOGASTRODUODENOSCOPY: SHX5428

## 2020-11-16 SURGERY — EGD (ESOPHAGOGASTRODUODENOSCOPY)
Anesthesia: General

## 2020-11-16 MED ORDER — PHENYLEPHRINE 40 MCG/ML (10ML) SYRINGE FOR IV PUSH (FOR BLOOD PRESSURE SUPPORT)
PREFILLED_SYRINGE | INTRAVENOUS | Status: AC
  Filled 2020-11-16: qty 10

## 2020-11-16 MED ORDER — SODIUM CHLORIDE 0.9 % IV SOLN: INTRAVENOUS | Status: DC

## 2020-11-16 MED ORDER — PROPOFOL 500 MG/50ML IV EMUL
INTRAVENOUS | Status: DC | PRN
  Administered 2020-11-16: 100 ug/kg/min via INTRAVENOUS

## 2020-11-16 MED ORDER — METOCLOPRAMIDE HCL 5 MG PO TABS: 5.0000 mg | ORAL_TABLET | Freq: Three times a day (TID) | ORAL | 0 refills | Status: DC

## 2020-11-16 MED ORDER — LIDOCAINE HCL (CARDIAC) PF 100 MG/5ML IV SOSY
PREFILLED_SYRINGE | INTRAVENOUS | Status: DC | PRN
  Administered 2020-11-16: 50 mg via INTRAVENOUS

## 2020-11-16 MED ORDER — PHENYLEPHRINE 40 MCG/ML (10ML) SYRINGE FOR IV PUSH (FOR BLOOD PRESSURE SUPPORT)
PREFILLED_SYRINGE | INTRAVENOUS | Status: DC | PRN
  Administered 2020-11-16 (×2): 80 ug via INTRAVENOUS

## 2020-11-16 MED ORDER — STERILE WATER FOR IRRIGATION IR SOLN
Status: DC | PRN
  Administered 2020-11-16: 1.5 mL

## 2020-11-16 MED ORDER — PROPOFOL 10 MG/ML IV BOLUS
INTRAVENOUS | Status: DC | PRN
  Administered 2020-11-16: 100 mg via INTRAVENOUS

## 2020-11-16 NOTE — Anesthesia Preprocedure Evaluation (Signed)
Anesthesia Evaluation  Patient identified by MRN, date of birth, ID band Patient awake    Reviewed: Allergy & Precautions, NPO status , Patient's Chart, lab work & pertinent test results  History of Anesthesia Complications Negative for: history of anesthetic complications  Airway Mallampati: II  TM Distance: >3 FB Neck ROM: Full    Dental  (+) Dental Advisory Given, Teeth Intact   Pulmonary neg pulmonary ROS,    Pulmonary exam normal breath sounds clear to auscultation       Cardiovascular Exercise Tolerance: Poor hypertension, Pt. on medications Normal cardiovascular exam Rhythm:Regular Rate:Normal     Neuro/Psych PSYCHIATRIC DISORDERS Anxiety Depression negative neurological ROS     GI/Hepatic GERD  Medicated and Controlled,  Endo/Other    Renal/GU ESRF and DialysisRenal disease (peritoneal dialysis - everyday)     Musculoskeletal   Abdominal   Peds  Hematology  (+) anemia ,   Anesthesia Other Findings   Reproductive/Obstetrics                             Anesthesia Physical Anesthesia Plan  ASA: III  Anesthesia Plan: General   Post-op Pain Management:    Induction: Intravenous  PONV Risk Score and Plan: Propofol infusion  Airway Management Planned: Nasal Cannula and Natural Airway  Additional Equipment:   Intra-op Plan:   Post-operative Plan:   Informed Consent: I have reviewed the patients History and Physical, chart, labs and discussed the procedure including the risks, benefits and alternatives for the proposed anesthesia with the patient or authorized representative who has indicated his/her understanding and acceptance.     Dental advisory given  Plan Discussed with: CRNA and Surgeon  Anesthesia Plan Comments:         Anesthesia Quick Evaluation

## 2020-11-16 NOTE — Transfer of Care (Signed)
Immediate Anesthesia Transfer of Care Note  Patient: Felicia Williams  Procedure(s) Performed: ESOPHAGOGASTRODUODENOSCOPY (EGD) (N/A ) POLYPECTOMY  Patient Location: Short Stay  Anesthesia Type:General  Level of Consciousness: awake and alert   Airway & Oxygen Therapy: Patient Spontanous Breathing  Post-op Assessment: Report given to RN and Post -op Vital signs reviewed and stable  Post vital signs: Reviewed and stable  Last Vitals:  Vitals Value Taken Time  BP    Temp 36.9 C 11/16/20 1455  Pulse 84 11/16/20 1455  Resp 25 11/16/20 1455  SpO2 100 % 11/16/20 1455    Last Pain:  Vitals:   11/16/20 1455  TempSrc: Oral  PainSc: 0-No pain         Complications: No complications documented.

## 2020-11-16 NOTE — Telephone Encounter (Signed)
noted 

## 2020-11-16 NOTE — Interval H&P Note (Signed)
Patient has not started esomeprazole and famotidine as recommended at the time of office visit 2 days ago.  She states she has not vomited since her office visit.  She remains with nausea epigastric pain and heartburn.  She has no new complaints. Blood work from nephrology clinic reviewed.  Blood work is from 10/26/2020.  H&H 9.9 and 29.5 and ALT 20. Patient's examination is unchanged. She has PD cannulae in place in right upper quadrant.  Abdominal examination otherwise is normal. She is ready to proceed with esophagogastroduodenoscopy.  History and Physical Interval Note:  11/16/2020 2:31 PM  Felicia Williams  has presented today for surgery, with the diagnosis of GERD, N/V.  The various methods of treatment have been discussed with the patient and family. After consideration of risks, benefits and other options for treatment, the patient has consented to  Procedure(s) with comments: ESOPHAGOGASTRODUODENOSCOPY (EGD) (N/A) - 1:15 as a surgical intervention.  The patient's history has been reviewed, patient examined, no change in status, stable for surgery.  I have reviewed the patient's chart and labs.  Questions were answered to the patient's satisfaction.     [employer]

## 2020-11-16 NOTE — Discharge Instructions (Signed)
No aspirin or NSAIDs for 24 hours. Resume usual medications as before. Metoclopramide 5 mg by mouth 30 minutes before each meal.  If you experience any side effects with this medication please stop it and call office. Resume usual diet. No driving for 24 hours. We will schedule abdominal ultrasound.  Office will call. Physician will call with biopsy results.   Upper Endoscopy, Adult, Care After This sheet gives you information about how to care for yourself after your procedure. Your health care provider may also give you more specific instructions. If you have problems or questions, contact your health care provider. What can I expect after the procedure? After the procedure, it is common to have:  A sore throat.  Mild stomach pain or discomfort.  Bloating.  Nausea. Follow these instructions at home:  Follow instructions from your health care provider about what to eat or drink after your procedure.  Return to your normal activities as told by your health care provider. Ask your health care provider what activities are safe for you.  Take over-the-counter and prescription medicines only as told by your health care provider.  If you were given a sedative during the procedure, it can affect you for several hours. Do not drive or operate machinery until your health care provider says that it is safe.  Keep all follow-up visits as told by your health care provider. This is important.   Contact a health care provider if you have:  A sore throat that lasts longer than one day.  Trouble swallowing. Get help right away if:  You vomit blood or your vomit looks like coffee grounds.  You have: ? A fever. ? Bloody, black, or tarry stools. ? A severe sore throat or you cannot swallow. ? Difficulty breathing. ? Severe pain in your chest or abdomen. Summary  After the procedure, it is common to have a sore throat, mild stomach discomfort, bloating, and nausea.  If you were given  a sedative during the procedure, it can affect you for several hours. Do not drive or operate machinery until your health care provider says that it is safe.  Follow instructions from your health care provider about what to eat or drink after your procedure.  Return to your normal activities as told by your health care provider. This information is not intended to replace advice given to you by your health care provider. Make sure you discuss any questions you have with your health care provider. Document Revised: 07/07/2019 Document Reviewed: 12/09/2017 Elsevier Patient Education  2021 [employer].

## 2020-11-16 NOTE — Anesthesia Postprocedure Evaluation (Signed)
Anesthesia Post Note  Patient: Felicia Williams  Procedure(s) Performed: ESOPHAGOGASTRODUODENOSCOPY (EGD) (N/A ) POLYPECTOMY  Patient location during evaluation: Phase II Anesthesia Type: General Level of consciousness: awake and alert and oriented Pain management: pain level controlled Vital Signs Assessment: post-procedure vital signs reviewed and stable Respiratory status: spontaneous breathing and respiratory function stable Cardiovascular status: blood pressure returned to baseline and stable Postop Assessment: no apparent nausea or vomiting Anesthetic complications: no   No complications documented.   Last Vitals:  Vitals:   11/16/20 1455 11/16/20 1501  BP: (!) 121/59 116/61  Pulse: 84   Resp: (!) 25   Temp: 36.9 C   SpO2: 100%     Last Pain:  Vitals:   11/16/20 1455  TempSrc: Oral  PainSc: 0-No pain                 Kaedan Richert C Talaysia Pinheiro

## 2020-11-16 NOTE — Op Note (Signed)
[facility] Patient Name: Felicia Williams Procedure Date: 11/16/2020 2:39 PM MRN: [id number] Date of Birth: Apr 05, 1941 Attending MD: Hildred Laser , MD CSN: [id number] Age: 80 Admit Type: Outpatient Procedure:                Upper GI endoscopy Indications:              Epigastric abdominal pain, Heartburn, Nausea with                            vomiting Providers:                Hildred Laser, MD, Caprice Kluver, Randa Spike,                            Technician Referring MD:             Tama High, MD Medicines:                Propofol per Anesthesia Complications:            No immediate complications. Estimated Blood Loss:     Estimated blood loss was minimal. Procedure:                Pre-Anesthesia Assessment:                           - Prior to the procedure, a History and Physical                            was performed, and patient medications and                            allergies were reviewed. The patient's tolerance of                            previous anesthesia was also reviewed. The risks                            and benefits of the procedure and the sedation                            options and risks were discussed with the patient.                            All questions were answered, and informed consent                            was obtained. Prior Anticoagulants: The patient has                            taken no previous anticoagulant or antiplatelet                            agents. ASA Grade Assessment: II - A patient with  mild systemic disease. After reviewing the risks                            and benefits, the patient was deemed in                            satisfactory condition to undergo the procedure.                           After obtaining informed consent, the endoscope was                            passed under direct vision. Throughout the                            procedure, the patient's  blood pressure, pulse, and                            oxygen saturations were monitored continuously. The                            [id number]) scope was introduced through the                            mouth, and advanced to the second part of duodenum.                            The upper GI endoscopy was accomplished without                            difficulty. The patient tolerated the procedure                            well. Scope In: 2:42:38 PM Scope Out: 2:49:27 PM Total Procedure Duration: 0 hours 6 minutes 49 seconds  Findings:      The hypopharynx was normal.      The proximal esophagus and mid esophagus were normal.      LA Grade A (one or more mucosal breaks less than 5 mm, not extending       between tops of 2 mucosal folds) esophagitis with no bleeding was found       29 to 30 cm from the incisors.      A widely patent Schatzki ring was found at the gastroesophageal junction.      A 5 cm hiatal hernia was present.      A few small sessile polyps with no stigmata of recent bleeding were       found in the gastric body. Biopsies were taken from 4 of these with a       cold forceps for histology and submitted together.      The exam of the stomach was otherwise normal.      The duodenal bulb and second portion of the duodenum were normal. Impression:               - Normal hypopharynx.                           -  Normal proximal esophagus and mid esophagus.                           - LA Grade A esophagitis with no bleeding.                           - Widely patent Schatzki ring.                           - 5 cm hiatal hernia.                           - A few gastric polyps. Four biopsied.                           - Normal duodenal bulb and second portion of the                            duodenum. Moderate Sedation:      Per Anesthesia Care Recommendation:           - Resume previous diet today.                           - Continue present medications.                            - Metoclopramide 5 mg by mouth 30 minutes before                            each meal.                           - No aspirin, ibuprofen, naproxen, or other                            non-steroidal anti-inflammatory drugs for 1 day.                           - Await pathology results.                           - Schedule abdominal ultrasound. Procedure Code(s):        --- Professional ---                           [id number], Esophagogastroduodenoscopy, flexible,                            transoral; with biopsy, single or multiple Diagnosis Code(s):        --- Professional ---                           K20.90, Esophagitis, unspecified without bleeding                           K22.2, Esophageal obstruction  K44.9, Diaphragmatic hernia without obstruction or                            gangrene                           K31.7, Polyp of stomach and duodenum                           R10.13, Epigastric pain                           R12, Heartburn                           R11.2, Nausea with vomiting, unspecified CPT copyright 2019 American Medical Association. All rights reserved. The codes documented in this report are preliminary and upon coder review may  be revised to meet current compliance requirements. Hildred Laser, MD Hildred Laser, MD 11/16/2020 3:04:37 PM This report has been signed electronically. Number of Addenda: 0

## 2020-11-16 NOTE — Telephone Encounter (Signed)
Per egd op note, patient need to be schedule for [place]

## 2020-11-16 NOTE — Anesthesia Procedure Notes (Signed)
Date/Time: 11/16/2020 2:44 PM Performed by: Orlie Dakin, CRNA Pre-anesthesia Checklist: Patient identified, Emergency Drugs available, Suction available and Patient being monitored Patient Re-evaluated:Patient Re-evaluated prior to induction Oxygen Delivery Method: Nasal cannula Induction Type: IV induction Placement Confirmation: positive ETCO2

## 2020-11-17 ENCOUNTER — Encounter (INDEPENDENT_AMBULATORY_CARE_PROVIDER_SITE_OTHER): Payer: Self-pay

## 2020-11-17 ENCOUNTER — Other Ambulatory Visit (INDEPENDENT_AMBULATORY_CARE_PROVIDER_SITE_OTHER): Payer: Self-pay

## 2020-11-17 DIAGNOSIS — R112 Nausea with vomiting, unspecified: Secondary | ICD-10-CM

## 2020-11-17 LAB — POCT I-STAT, CHEM 8
BUN: 45 mg/dL — ABNORMAL HIGH (ref 8–23)
Calcium, Ion: 1.16 mmol/L (ref 1.15–1.40)
Chloride: 94 mmol/L — ABNORMAL LOW (ref 98–111)
Creatinine, Ser: 12.6 mg/dL — ABNORMAL HIGH (ref 0.44–1.00)
Glucose, Bld: 98 mg/dL (ref 70–99)
HCT: 34 % — ABNORMAL LOW (ref 36.0–46.0)
Hemoglobin: 11.6 g/dL — ABNORMAL LOW (ref 12.0–15.0)
Potassium: 3.9 mmol/L (ref 3.5–5.1)
Sodium: 134 mmol/L — ABNORMAL LOW (ref 135–145)
TCO2: 28 mmol/L (ref 22–32)

## 2020-11-17 NOTE — Addendum Note (Signed)
Addendum  created 11/17/20 1335 by Orlie Dakin, CRNA   Charge Capture section accepted

## 2020-11-18 ENCOUNTER — Encounter (HOSPITAL_COMMUNITY): Payer: Self-pay | Admitting: Internal Medicine

## 2020-11-18 LAB — SURGICAL PATHOLOGY

## 2020-11-24 ENCOUNTER — Ambulatory Visit (HOSPITAL_COMMUNITY)
Admission: RE | Admit: 2020-11-24 | Discharge: 2020-11-24 | Disposition: A | Discharge status: Home / Self Care | Payer: Medicare Other | Source: Ambulatory Visit | Attending: Internal Medicine | Admitting: Internal Medicine

## 2020-11-24 DIAGNOSIS — R112 Nausea with vomiting, unspecified: Secondary | ICD-10-CM | POA: Diagnosis present

## 2020-11-30 ENCOUNTER — Other Ambulatory Visit (INDEPENDENT_AMBULATORY_CARE_PROVIDER_SITE_OTHER): Payer: Self-pay

## 2020-11-30 DIAGNOSIS — K869 Disease of pancreas, unspecified: Secondary | ICD-10-CM

## 2020-11-30 NOTE — Progress Notes (Signed)
[name], CMA 

## 2020-12-09 ENCOUNTER — Ambulatory Visit (HOSPITAL_COMMUNITY)
Admission: RE | Admit: 2020-12-09 | Discharge: 2020-12-09 | Disposition: A | Discharge status: Home / Self Care | Payer: Medicare Other | Source: Ambulatory Visit | Attending: Internal Medicine | Admitting: Internal Medicine

## 2020-12-09 ENCOUNTER — Other Ambulatory Visit: Payer: Self-pay

## 2020-12-09 ENCOUNTER — Other Ambulatory Visit (INDEPENDENT_AMBULATORY_CARE_PROVIDER_SITE_OTHER): Payer: Self-pay | Admitting: Internal Medicine

## 2020-12-09 DIAGNOSIS — K869 Disease of pancreas, unspecified: Secondary | ICD-10-CM | POA: Insufficient documentation

## 2021-01-16 ENCOUNTER — Emergency Department: Payer: Medicare Other

## 2021-01-16 ENCOUNTER — Encounter: Payer: Self-pay | Admitting: Emergency Medicine

## 2021-01-16 ENCOUNTER — Emergency Department
Admission: EM | Admit: 2021-01-16 | Discharge: 2021-01-16 | Disposition: A | Discharge status: Home / Self Care | Payer: Medicare Other | Attending: Emergency Medicine | Admitting: Emergency Medicine

## 2021-01-16 ENCOUNTER — Encounter (HOSPITAL_COMMUNITY): Payer: Self-pay | Admitting: Internal Medicine

## 2021-01-16 ENCOUNTER — Other Ambulatory Visit: Payer: Self-pay

## 2021-01-16 DIAGNOSIS — Z79899 Other long term (current) drug therapy: Secondary | ICD-10-CM | POA: Diagnosis not present

## 2021-01-16 DIAGNOSIS — R079 Chest pain, unspecified: Secondary | ICD-10-CM | POA: Diagnosis not present

## 2021-01-16 DIAGNOSIS — R1013 Epigastric pain: Secondary | ICD-10-CM

## 2021-01-16 DIAGNOSIS — I120 Hypertensive chronic kidney disease with stage 5 chronic kidney disease or end stage renal disease: Secondary | ICD-10-CM | POA: Diagnosis not present

## 2021-01-16 DIAGNOSIS — N186 End stage renal disease: Secondary | ICD-10-CM | POA: Diagnosis not present

## 2021-01-16 DIAGNOSIS — G8929 Other chronic pain: Secondary | ICD-10-CM | POA: Insufficient documentation

## 2021-01-16 DIAGNOSIS — K219 Gastro-esophageal reflux disease without esophagitis: Secondary | ICD-10-CM | POA: Insufficient documentation

## 2021-01-16 DIAGNOSIS — R112 Nausea with vomiting, unspecified: Secondary | ICD-10-CM | POA: Diagnosis not present

## 2021-01-16 DIAGNOSIS — M549 Dorsalgia, unspecified: Secondary | ICD-10-CM | POA: Diagnosis not present

## 2021-01-16 LAB — CBC
HCT: 35.1 % — ABNORMAL LOW (ref 36.0–46.0)
Hemoglobin: 12.6 g/dL (ref 12.0–15.0)
MCH: 35.4 pg — ABNORMAL HIGH (ref 26.0–34.0)
MCHC: 35.9 g/dL (ref 30.0–36.0)
MCV: 98.6 fL (ref 80.0–100.0)
Platelets: 339 10*3/uL (ref 150–400)
RBC: 3.56 MIL/uL — ABNORMAL LOW (ref 3.87–5.11)
RDW: 14.1 % (ref 11.5–15.5)
WBC: 11.2 10*3/uL — ABNORMAL HIGH (ref 4.0–10.5)
nRBC: 0 % (ref 0.0–0.2)

## 2021-01-16 LAB — HEPATIC FUNCTION PANEL
ALT: 17 U/L (ref 0–44)
AST: 20 U/L (ref 15–41)
Albumin: 3.3 g/dL — ABNORMAL LOW (ref 3.5–5.0)
Alkaline Phosphatase: 60 U/L (ref 38–126)
Bilirubin, Direct: <0.1 mg/dL (ref 0.0–0.2)
Total Bilirubin: 0.7 mg/dL (ref 0.3–1.2)
Total Protein: 7.3 g/dL (ref 6.5–8.1)

## 2021-01-16 LAB — BASIC METABOLIC PANEL
Anion gap: 13 (ref 5–15)
BUN: 58 mg/dL — ABNORMAL HIGH (ref 8–23)
CO2: 30 mmol/L (ref 22–32)
Calcium: 10 mg/dL (ref 8.9–10.3)
Chloride: 92 mmol/L — ABNORMAL LOW (ref 98–111)
Creatinine, Ser: 10.78 mg/dL — ABNORMAL HIGH (ref 0.44–1.00)
GFR, Estimated: 3 mL/min — ABNORMAL LOW (ref >60)
Glucose, Bld: 116 mg/dL — ABNORMAL HIGH (ref 70–99)
Potassium: 3.9 mmol/L (ref 3.5–5.1)
Sodium: 135 mmol/L (ref 135–145)

## 2021-01-16 LAB — TROPONIN I (HIGH SENSITIVITY): Troponin I (High Sensitivity): 15 ng/L (ref <18)

## 2021-01-16 LAB — LIPASE, BLOOD: Lipase: 59 U/L — ABNORMAL HIGH (ref 11–51)

## 2021-01-16 NOTE — ED Provider Notes (Signed)
[facility] Emergency Department Provider Note  ____________________________________________   Event Date/Time   First MD Initiated Contact with Patient 01/16/21 1532     (approximate)  I have reviewed the triage vital signs and the nursing notes.   HISTORY  Chief Complaint Abdominal Pain   HPI Felicia Williams is a 80 y.o. 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  First seen patient has chronic intermittent nausea and vomiting secondary to "slow stomach" and reflux but seem to have some vomiting 3 days ago was associated with significant decrease in p.o. intake.  Patient states that the following few days her blood pressure was very low with systolics in the 0000000 and is typically in the 150s.  During this time her daughter states that the patient seemed a little confused and to be repeating nonsensical things although did not have any falls or focal deficits.  Patient does not remember exactly what happened but thinks something was not quite right at this time.  She did have some lower chest/epigastric pain yesterday which she describes as burning.  She states is very typical of prior epigastric and lower chest pain she has had in the past which is usually responded to antacids but she also thinks she may have had polyps in her stomach and is not sure if this is related or "slow stomach she was diagnosed with by her GI doctor.  She states he has been sometime since she has seen her GI doctor has been taking all her medicines as directed.  She denies any EtOH or NSAID use.  She states she currently has no chest pain, nausea, abdominal pain but has chronic back pain.  No acute back pain, headache, earache, sore throat, fevers, rash, urinary symptoms (patient does still make a small mount of urine every day) or any other clear  acute associate complaints.         Past Medical History:  Diagnosis Date   Anemia in stage 4 chronic kidney disease ([place]) 08/18/2018   Depression    Essential hypertension, benign    GERD (gastroesophageal reflux disease)    Glomerulonephritis    Gout    Mixed hyperlipidemia    Renal insufficiency     Patient Active Problem List   Diagnosis Date Noted   GERD (gastroesophageal reflux disease) 11/14/2020   N&V (nausea and vomiting) 11/14/2020   Renal failure 01/15/2019   ARF (acute renal failure) ([place]) 01/07/2019   Glomerulonephritis 01/07/2019   Anxiety and depression 01/07/2019   Psoriasis 01/07/2019   Tachycardia, unspecified 01/07/2019   Anemia in stage 4 chronic kidney disease ([place]) 08/18/2018   Essential hypertension, benign 01/26/2014   Palpitations 01/26/2014   Fatigue due to depression 01/26/2014    Past Surgical History:  Procedure Laterality Date   APPENDECTOMY     CATARACT EXTRACTION     COLONOSCOPY N/A 12/28/2015   Procedure: COLONOSCOPY;  Surgeon: Rogene Houston, MD;  Location: [facility] END[facility];  Service: Endoscopy;  Laterality: N/A;  815   ESOPHAGOGASTRODUODENOSCOPY N/A 11/16/2020   Procedure: ESOPHAGOGASTRODUODENOSCOPY (EGD);  Surgeon: Rogene Houston, MD;  Location: [facility] ENDO SUITE;  Service: Endoscopy;  Laterality: N/A;  1:15   IR FLUORO GUIDE CV LINE RIGHT  01/16/2019   IR REMOVAL TUN CV CATH W/O FL  07/01/2019   IR US GUIDE VASC ACCESS RIGHT  01/16/2019  POLYPECTOMY  11/16/2020   Procedure: POLYPECTOMY;  Surgeon: Rogene Houston, MD;  Location: [facility] ENDO SU[facility];  Service: Endoscopy;;  gastric   TUBAL LIGATION      Prior to Admission medications   Medication Sig Start Date End Date Taking? Authorizing Provider  calcium acetate, Phos Binder, (PHOSLYRA) 667 MG/5ML SOLN Take 10 mLs by mouth 3 (three) times daily with meals.    [provider]  calcium carbonate (TUMS - DOSED IN MG ELEMENTAL CALCIUM) 500 MG chewable tablet Chew 1 tablet (200 mg of  elemental calcium total) by mouth 2 (two) times daily with a meal. Patient taking differently: Chew 200 mg of elemental calcium by mouth daily as needed for indigestion or heartburn. 01/20/19   Nita Sells, MD  carvedilol (COREG) 12.5 MG tablet Take 25 mg by mouth 2 (two) times daily with a meal. 03/01/15   [provider]  esomeprazole (NEXIUM) 40 MG capsule Take 1 capsule (40 mg total) by mouth daily before breakfast. 11/14/20   Rehman, Mechele Dawley, MD  famotidine (PEPCID) 20 MG tablet Take 1 tablet (20 mg total) by mouth at bedtime. 11/14/20   Rogene Houston, MD  hydrALAZINE (APRESOLINE) 50 MG tablet Take 50 mg by mouth 2 (two) times a day.  09/15/18   [provider]  HYDROcodone-acetaminophen (NORCO/VICODIN) 5-325 MG tablet Take 1 tablet by mouth every 6 (six) hours as needed for moderate pain.    [provider]  metoCLOPramide (REGLAN) 5 MG tablet Take 1 tablet (5 mg total) by mouth 3 (three) times daily before meals for 14 days. 11/16/20 11/30/20  Rogene Houston, MD  multivitamin (RENA-VIT) TABS tablet Take 1 tablet by mouth at bedtime. 01/20/19   Nita Sells, MD  ondansetron (ZOFRAN) 8 MG tablet Take 8 mg by mouth every 12 (twelve) hours as needed for nausea or vomiting.    [provider]  potassium chloride (KLOR-CON) 10 MEQ tablet Take 10 mEq by mouth 2 (two) times daily.    [provider]  rOPINIRole (REQUIP) 0.5 MG tablet Take 0.75 mg by mouth at bedtime.    [provider]  Vitamin D, Cholecalciferol, 50 MCG (2000 UT) CAPS Take 2,000 Units by mouth daily.     [provider]    Allergies Sulfa antibiotics  Family History  Problem Relation Age of Onset   CAD Father    Heart attack Father    Diabetes Mellitus II Father    Hypertension Father    Lupus Brother     Social History Social History   Tobacco Use   Smoking status: Never   Smokeless tobacco: Never  Vaping Use   Vaping Use: Never used   Substance Use Topics   Alcohol use: No   Drug use: No    Review of Systems  Review of Systems  Constitutional:  Negative for chills and fever.  HENT:  Negative for sore throat.   Eyes:  Negative for pain.  Respiratory:  Negative for cough and stridor.   Cardiovascular:  Positive for chest pain.  Gastrointestinal:  Positive for nausea and vomiting.  Skin:  Negative for rash.  Neurological:  Negative for seizures, loss of consciousness and headaches.  Psychiatric/Behavioral:  Negative for suicidal ideas.   All other systems reviewed and are negative.    ____________________________________________   PHYSICAL EXAM:  VITAL SIGNS: ED Triage Vitals  Enc Vitals Group     BP 01/16/21 1400 120/75     Pulse Rate 01/16/21 1400 80  Resp 01/16/21 1400 16     Temp 01/16/21 1400 98 F (36.7 C)     Temp Source 01/16/21 1400 Oral     SpO2 01/16/21 1400 98 %     Weight 01/16/21 1403 120 lb 9.5 oz (54.7 kg)     Height 01/16/21 1403 '4\' 11"'$  (1.499 m)     Head Circumference --      Peak Flow --      Pain Score 01/16/21 1403 0     Pain Loc --      Pain Edu? --      Excl. in [place]? --    Vitals:   01/16/21 1400 01/16/21 1545  BP: 120/75 (!) 151/62  Pulse: 80 71  Resp: 16 16  Temp: 98 F (36.7 C)   SpO2: 98% 100%   Physical Exam Vitals and nursing note reviewed.  Constitutional:      General: She is not in acute distress.    Appearance: She is well-developed.  HENT:     Head: Normocephalic and atraumatic.     Right Ear: External ear normal.     Left Ear: External ear normal.     Nose: Nose normal.     Mouth/Throat:     Mouth: Mucous membranes are moist.  Eyes:     Conjunctiva/sclera: Conjunctivae normal.  Cardiovascular:     Rate and Rhythm: Normal rate and regular rhythm.     Heart sounds: No murmur heard. Pulmonary:     Effort: Pulmonary effort is normal. No respiratory distress.     Breath sounds: Normal breath sounds.  Abdominal:     Palpations: Abdomen is soft.      Tenderness: There is no abdominal tenderness. There is no right CVA tenderness or left CVA tenderness.  Musculoskeletal:     Cervical back: Neck supple.  Skin:    General: Skin is warm and dry.     Capillary Refill: Capillary refill takes less than 2 seconds.  Neurological:     Mental Status: She is alert and oriented to person, place, and time.  Psychiatric:        Mood and Affect: Mood normal.    Cranial nerves II through XII grossly intact.  No pronator drift.  No finger dysmetria.  Symmetric 5/5 strength of all extremities.  Sensation intact to light touch in all extremities.  Unremarkable unassisted gait.  ____________________________________________   LABS (all labs ordered are listed, but only abnormal results are displayed)  Labs Reviewed  BASIC METABOLIC PANEL - Abnormal; Notable for the following components:      Result Value   Chloride 92 (*)    Glucose, Bld 116 (*)    BUN 58 (*)    Creatinine, Ser 10.78 (*)    GFR, Estimated 3 (*)    All other components within normal limits  CBC - Abnormal; Notable for the following components:   WBC 11.2 (*)    RBC 3.56 (*)    HCT 35.1 (*)    MCH 35.4 (*)    All other components within normal limits  HEPATIC FUNCTION PANEL - Abnormal; Notable for the following components:   Albumin 3.3 (*)    All other components within normal limits  LIPASE, BLOOD - Abnormal; Notable for the following components:   Lipase 59 (*)    All other components within normal limits  URINALYSIS, COMPLETE (UACMP) WITH MICROSCOPIC  TROPONIN I (HIGH SENSITIVITY)   ____________________________________________  EKG  Sinus rhythm with ventricular rate 76, left  axis deviation, unremarkable intervals without clearance of acute ischemia or significant arrhythmia. ____________________________________________  RADIOLOGY  ED MD interpretation: Chest x-ray has no focal consolidation, large effusion, pneumothorax or any other clear acute intrathoracic  process.  Official radiology report(s): DG Chest 2 View  Result Date: 01/16/2021 CLINICAL DATA:  Epigastric abdominal pain EXAM: CHEST - 2 VIEW COMPARISON:  01/15/2019 FINDINGS: The heart size and mediastinal contours are within normal limits. Atherosclerotic calcification of the thoracic aorta. Both lungs are clear. The visualized skeletal structures are unremarkable. IMPRESSION: No active cardiopulmonary disease. Electronically Signed   By: Miachel Roux M.D.   On: 01/16/2021 15:23   CT Head Wo Contrast  Result Date: 01/16/2021 CLINICAL DATA:  Mental status change EXAM: CT HEAD WITHOUT CONTRAST TECHNIQUE: Contiguous axial images were obtained from the base of the skull through the vertex without intravenous contrast. COMPARISON:  None. FINDINGS: Brain: No acute territorial infarction, hemorrhage, or intracranial mass. Mild atrophy. Nonenlarged ventricles. Vascular: No hyperdense vessels.  Carotid vascular calcification Skull: Normal. Negative for fracture or focal lesion. Sinuses/Orbits: Mucosal thickening with calcification in the left sphenoid sinus Other: None IMPRESSION: 1. No CT evidence for acute intracranial abnormality. 2. Atrophy Electronically Signed   By: Donavan Foil M.D.   On: 01/16/2021 16:19    ____________________________________________   PROCEDURES  Procedure(s) performed (including Critical Care):  .1-3 Lead EKG Interpretation  Date/Time: 01/16/2021 4:40 PM Performed by: Lucrezia Starch, MD Authorized by: Lucrezia Starch, MD     Interpretation: normal     ECG rate assessment: normal     Rhythm: sinus rhythm     Ectopy: none     Conduction: normal     ____________________________________________   INITIAL IMPRESSION / ASSESSMENT AND PLAN / ED COURSE      Patient presents with above to history exam for several complaints.  She is coming by her daughter.  Daughter provides most of the history noted above as patient states she feels fine and does not want to be  in emergency room.  It seems she had some confusion over the last 2 days associate with low blood pressures which patient and daughter think are related to patient not eating or drinking much over the last couple days.  Patient also states she had some burning lower chest and epigastric pain also similar to prior episode she has had in the past but this resolved today and she has not had any recurrence of pain or confusion or low blood pressure today.  On arrival she is still hypertensive with BP of 151/60 with otherwise stable vital signs on room air.  Per daughter and patient this is where her blood pressure typically is.  She has a nonfocal supine neuro exam without evidence of CVA.  As noted she is chest pain-free in the emergency room and ECG and nonelevated troponin obtained greater than 3 hours after symptom onset are not suggestive of ACS or myocarditis.  Chest x-ray has no evidence of pneumonia, no thorax, effusion, edema or other clear acute process and overall history and exam not consistent with perforated viscus.  Lipase is not consistent with acute pancreatitis and patient has no significant epigastric discomfort respecters for this.  No significant tenderness in the right upper quadrant or evidence of cholestasis and hepatic function panel to suggest cholecystitis or cholestatic process.  CBC shows slight leukocytosis of 11.2 somewhat nonspecific and hemoglobin of 12.6.  BMP shows BUN and creatinine consistent with patient's reported history of ESRD without any other  significant electrolyte or metabolic derangements.  Pain described yesterday does seem most consistent with likely GI source although given patient has been able tolerate p.o. subsequently and is pain-free at this time believe she requires hospitalization or other emergent work-up for this.  In addition she is not hypotensive and has a nonfocal exam with reassuring head CT without evidence of CVA or other acute intracranial normalities.   This possible she had some confusion related to low blood pressure from decreased p.o. intake although this seems to have resolved and I emphasized to patient and daughter importance of staying adequately hydrated.  Patient is amenable to drink more fluids over the next day or so and following up with her PCP and GI doctor next couple days.  Discharged stable condition.  Strict return precautions advised and discussed.  ____________________________________________   FINAL CLINICAL IMPRESSION(S) / ED DIAGNOSES  Final diagnoses:  Chest pain, unspecified type  Epigastric pain  ESRD (end stage renal disease) ([place])    Medications - No data to display   ED Discharge Orders     None        Note:  This document was prepared using Dragon voice recognition software and may include unintentional dictation errors.    Lucrezia Starch, MD 01/16/21 [phone]

## 2021-01-16 NOTE — ED Notes (Signed)
EDP at bedside  

## 2021-01-16 NOTE — ED Triage Notes (Addendum)
C/O epigastric pain.  States has intermittent problems with epigastric pain, sees GI for symptoms.  Denies current complaint, however on Friday symptoms returned and stopped overnight. Takes Prilosec for heartburn, currently out of medication.  Taking tums.  Patient's daughter also describes an episode of low blood pressure, unsteady gait, and not speaking in complete sentences yesterday.  All symptoms have resolved.  AAOx3.  Skin warm and dry. NAD

## 2021-01-16 NOTE — ED Notes (Addendum)
See triage note. Daughter at bedside who describes pt GI hx which includes hiatal hernia and recent removal of "polyps in stomach". Pt has peritoneal dialysis access site. GI doctor has told her she has gastroparesis, "slow stomach".   Pt states this weekend she felt pain in sternal area when tried to eat or drink. Pt in NAD, denies abdominal pain.  Daughter states that over the weekend she was weak and confused and sentences did not make sense. BP was 90/59 which is low for her. Daughter wanted to rule out possible TIA.   Pt states had several episodes of vomiting 3d ago. Did not have much PO intake over the weekend.

## 2021-01-24 ENCOUNTER — Ambulatory Visit (INDEPENDENT_AMBULATORY_CARE_PROVIDER_SITE_OTHER): Payer: Medicare Other | Admitting: Internal Medicine

## 2021-01-24 ENCOUNTER — Other Ambulatory Visit: Payer: Self-pay

## 2021-01-24 ENCOUNTER — Encounter (INDEPENDENT_AMBULATORY_CARE_PROVIDER_SITE_OTHER): Payer: Self-pay | Admitting: Internal Medicine

## 2021-01-24 VITALS — BP 132/76 | HR 69 | Temp 98.1°F | Ht 59.0 in | Wt 122.2 lb

## 2021-01-24 DIAGNOSIS — K219 Gastro-esophageal reflux disease without esophagitis: Secondary | ICD-10-CM | POA: Diagnosis not present

## 2021-01-24 DIAGNOSIS — R112 Nausea with vomiting, unspecified: Secondary | ICD-10-CM

## 2021-01-24 MED ORDER — LANSOPRAZOLE 30 MG PO CPDR: 30.0000 mg | DELAYED_RELEASE_CAPSULE | Freq: Every day | ORAL | 5 refills | Status: DC

## 2021-01-24 NOTE — Progress Notes (Signed)
Presenting complaint;  Follow-up for nausea and vomiting.  Database and subjective:  Patient is 80 year old Caucasian female with history of end-stage renal disease on peritoneal dialysis who was last seen on 11/14/2020 for nausea vomiting daily heartburn and epigastric pain.  Patient reported that pantoprazole was not working anymore. Patient was switched to esomeprazole 40 mg in the morning and famotidine 20 mg in the evening. She underwent diagnostic esophagogastroduodenoscopy on 11/16/2020.  She was noted to have grade a reflux esophagitis wide-open Schatzki's ring and small sliding hiatal hernia.  She had few gastric polyps.  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  H. pylori stains were negative.  Patient was begun on low-dose metoclopramide and abdominal ultrasound was scheduled. Ultrasound on 11/24/2020 revealed 18 x 18 x 17 mm cystic lesion in pancreatic tail.  Study was negative for cholelithiasis dilated bile duct or gallbladder wall thickening. Therefore MR abdomen was obtained without contrast on 12/09/2020.  This study revealed unilocular cystic lesion arising out of the proximal pancreatic tail measuring 20 mm.  There were no septations or wall nodularity.  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  When I contacted patient with results she said she was feeling better and we therefore decided not to pursue with further work-up.  She returns with her daughter [name].  She says she is not feeling well.  She has had 4 episodes of vomiting in the last 6 weeks.  She had emesis this morning while she was brushing her teeth.  She vomited few pieces of corn that she eaten day before around 3 PM.  She remains with daily heartburn which usually occurs at night.  She does not feel well.  She was seen in emergency room on 01/16/2021 for low blood pressure.  Head CT was unremarkable for acute problems.  Today she is also complaining of  painful swallowing which only occurs when she drinks ice tea.  The symptoms started 2 or 3 weeks ago.  She has no difficulty with solids.  She has chronic pain which she takes Norco only once a day. She does not feel that she had any side effects with metoclopramide. She has not lost any weight since her last visit.  She reports no problem with peritoneal dialysis which she administers herself every night.   Current Medications: Outpatient Encounter Medications as of 01/24/2021  Medication Sig   calcium acetate, Phos Binder, (PHOSLYRA) 667 MG/5ML SOLN Take 10 mLs by mouth 3 (three) times daily with meals.   calcium carbonate (TUMS - DOSED IN MG ELEMENTAL CALCIUM) 500 MG chewable tablet Chew 1 tablet (200 mg of elemental calcium total) by mouth 2 (two) times daily with a meal. (Patient taking differently: Chew 200 mg of elemental calcium by mouth daily as needed for indigestion or heartburn.)   carvedilol (COREG) 12.5 MG tablet Take 25 mg by mouth 2 (two) times daily with a meal.   famotidine (PEPCID) 20 MG tablet Take 1 tablet (20 mg total) by mouth at bedtime.   HYDROcodone-acetaminophen (NORCO/VICODIN) 5-325 MG tablet Take 1 tablet by mouth every 6 (six) hours as needed for moderate pain.   metoCLOPramide (REGLAN) 5 MG tablet Take 1 tablet (5 mg total) by mouth 3 (three) times daily before meals for 14 days.   multivitamin (RENA-VIT) TABS tablet Take 1 tablet by mouth at bedtime.   ondansetron (ZOFRAN) 8 MG tablet Take 8 mg by mouth every 12 (twelve) hours  as needed for nausea or vomiting.   rOPINIRole (REQUIP) 0.5 MG tablet Take 0.75 mg by mouth at bedtime.   Vitamin D, Cholecalciferol, 50 MCG (2000 UT) CAPS Take 2,000 Units by mouth daily.    esomeprazole (NEXIUM) 40 MG capsule Take 1 capsule (40 mg total) by mouth daily before breakfast. (Patient not taking: Reported on 01/24/2021)   hydrALAZINE (APRESOLINE) 50 MG tablet Take 50 mg by mouth 2 (two) times a day.  (Patient not taking: Reported on  01/24/2021)   potassium chloride (KLOR-CON) 10 MEQ tablet Take 10 mEq by mouth 2 (two) times daily. (Patient not taking: Reported on 01/24/2021)   No facility-administered encounter medications on file as of 01/24/2021.     Objective: Blood pressure 132/76, pulse 69, temperature 98.1 F (36.7 C), temperature source Oral, height '4\' 11"'  (1.499 m), weight 122 lb 3.2 oz (55.4 kg). Patient is alert and in no acute distress. She is wearing a mask. Conjunctiva is pink. Sclera is nonicteric Oropharyngeal mucosa is normal. No neck masses or thyromegaly noted. Cardiac exam with regular rhythm normal S1 and S2. No murmur or gallop noted. Lungs are clear to auscultation. Abdomen is symmetrical.  She has a PD cannula entry site in right sufferer abdomen covered with dressing.  Bowel sounds are normal.  On palpation abdomen is soft and nontender with organomegaly or masses. No LE edema or clubbing noted.  Labs/studies Results:   CBC Latest Ref Rng & Units 01/16/2021 11/16/2020 01/19/2019  WBC 4.0 - 10.5 K/uL 11.2(H) - 14.9(H)  Hemoglobin 12.0 - 15.0 g/dL 12.6 11.6(L) 9.5(L)  Hematocrit 36.0 - 46.0 % 35.1(L) 34.0(L) 29.7(L)  Platelets 150 - 400 K/uL 339 - 231    CMP Latest Ref Rng & Units 01/16/2021 11/16/2020 01/20/2019  Glucose 70 - 99 mg/dL 116(H) 98 107(H)  BUN 8 - 23 mg/dL 58(H) 45(H) 39(H)  Creatinine 0.44 - 1.00 mg/dL 10.78(H) 12.60(H) 4.22(H)  Sodium 135 - 145 mmol/L 135 134(L) 136  Potassium 3.5 - 5.1 mmol/L 3.9 3.9 4.1  Chloride 98 - 111 mmol/L 92(L) 94(L) 103  CO2 22 - 32 mmol/L 30 - 24  Calcium 8.9 - 10.3 mg/dL 10.0 - 7.4(L)  Total Protein 6.5 - 8.1 g/dL 7.3 - -  Total Bilirubin 0.3 - 1.2 mg/dL 0.7 - -  Alkaline Phos 38 - 126 U/L 60 - -  AST 15 - 41 U/L 20 - -  ALT 0 - 44 U/L 17 - -    Hepatic Function Latest Ref Rng & Units 01/16/2021 01/20/2019 01/19/2019  Total Protein 6.5 - 8.1 g/dL 7.3 - -  Albumin 3.5 - 5.0 g/dL 3.3(L) 2.0(L) 2.0(L)  AST 15 - 41 U/L 20 - -  ALT 0 - 44 U/L 17 - -   Alk Phosphatase 38 - 126 U/L 60 - -  Total Bilirubin 0.3 - 1.2 mg/dL 0.7 - -  Bilirubin, Direct 0.0 - 0.2 mg/dL <0.1 - -    Lab data from 01/16/2021 reviewed.   Assessment:  #1.  Nausea and vomiting.  EGD revealed erosive reflux esophagitis but no evidence of peptic ulcer disease or pyloric stenosis.  Ultrasound was negative for cholelithiasis or dilated bile duct.  Given her history gastroparesis is very likely.  I suspect she had some relief with low-dose metoclopramide.  #2.  GERD.  GERD symptoms are not well controlled with combination of esomeprazole and famotidine.  Reason may be underlying gastroparesis.  In the meantime we will change her PPI yet again.  #3.  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  I doubt that this has anything to do with patient's symptoms.  Definitely would need to be followed up.  Would plan MRI in 1 year.   Plan:  Discontinue esomeprazole. Begin lansoprazole 30 mg by mouth 30 minutes before breakfast daily. Continue famotidine 20 mg by mouth every evening or at bedtime. Proceed with solid-phase gastric emptying study. If gastric emptying study is normal would consider HIDA scan with CCK. Office visit in 3 months.

## 2021-01-24 NOTE — Patient Instructions (Signed)
Physician will call with results of gastric emptying study when completed. Take lansoprazole 30 mg by mouth 30 minutes before breakfast daily and continue famotidine at bedtime.

## 2021-02-06 ENCOUNTER — Other Ambulatory Visit (HOSPITAL_COMMUNITY): Payer: Medicare Other

## 2021-02-09 ENCOUNTER — Other Ambulatory Visit (INDEPENDENT_AMBULATORY_CARE_PROVIDER_SITE_OTHER): Payer: Self-pay | Admitting: Internal Medicine

## 2021-02-09 ENCOUNTER — Ambulatory Visit (HOSPITAL_COMMUNITY)
Admission: RE | Admit: 2021-02-09 | Discharge: 2021-02-09 | Disposition: A | Discharge status: Home / Self Care | Payer: Medicare Other | Source: Ambulatory Visit | Attending: Internal Medicine | Admitting: Internal Medicine

## 2021-02-09 ENCOUNTER — Other Ambulatory Visit: Payer: Self-pay

## 2021-02-09 DIAGNOSIS — R112 Nausea with vomiting, unspecified: Secondary | ICD-10-CM | POA: Diagnosis present

## 2021-02-09 DIAGNOSIS — K30 Functional dyspepsia: Secondary | ICD-10-CM | POA: Insufficient documentation

## 2021-02-09 MED ORDER — TECHNETIUM TC 99M SULFUR COLLOID
2.0000 | Freq: Once | INTRAVENOUS | Status: AC | PRN
  Administered 2021-02-09: 2.2 via ORAL

## 2021-02-09 MED ORDER — MOTEGRITY 1 MG PO TABS: 1.0000 mg | ORAL_TABLET | Freq: Every day | ORAL | 0 refills | Status: DC

## 2021-02-20 ENCOUNTER — Telehealth (INDEPENDENT_AMBULATORY_CARE_PROVIDER_SITE_OTHER): Payer: Self-pay | Admitting: *Deleted

## 2021-02-20 NOTE — Telephone Encounter (Signed)
Pt called states dr Laural Golden prescribed motegrity. She took first dose on Friday and it made her have diarrhea. She did not take another dose. She states she is just getting over having diarrhea from taking the one pill. States 30 day supply was $100. Wants to know if she should cut in half.  [facility] [phone]

## 2021-02-21 NOTE — Telephone Encounter (Signed)
Discussed with dr Laural Golden. Pt may take one fourth tablet daily and if that does not help then go to one half tablet daily. Call back if problems. Pt verbalized understanding.

## 2021-05-23 ENCOUNTER — Ambulatory Visit (INDEPENDENT_AMBULATORY_CARE_PROVIDER_SITE_OTHER): Payer: Medicare Other | Admitting: Internal Medicine

## 2021-05-23 ENCOUNTER — Other Ambulatory Visit: Payer: Self-pay

## 2021-05-23 ENCOUNTER — Encounter (INDEPENDENT_AMBULATORY_CARE_PROVIDER_SITE_OTHER): Payer: Self-pay | Admitting: Internal Medicine

## 2021-05-23 DIAGNOSIS — K3184 Gastroparesis: Secondary | ICD-10-CM

## 2021-05-23 DIAGNOSIS — R053 Chronic cough: Secondary | ICD-10-CM

## 2021-05-23 NOTE — Progress Notes (Addendum)
Presenting complaint;  Follow for chronic nausea and vomiting/gastroparesis.  Database and subjective:  Patient is 80 year old Caucasian female who is here for scheduled visit.  Patient has end-stage renal disease and has been maintained on peritoneal dialysis. She was evaluated initially in April 2022 for nausea vomiting and heartburn.  Change in PPI did not make any difference.  Esophagogastroduodenoscopy revealed small sliding hiatal hernia wide-open Schatzki's ring and grade A reflux esophagitis.  She had few gastric polyps and biopsy from some of these reveal fundic gland polyps.  H. pylori stains were negative.  She was treated with low-dose metoclopramide which provided suboptimal relief. She had abdominal ultrasound on 11/24/2020 which revealed small volume ascites normal gallbladder wall and no evidence of cholelithiasis.  She had 18 mm anechoic lesion in pancreatic tail felt to be IPMN or pseudocyst.  She also had atrophic kidneys. 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  Follow-up recommended in 2 years. She had solid-phase gastric emptying study in July 2022 which revealed gastroparesis.  As symptom control was not satisfactory with dietary measures I recommended trying Motegrity/prucalopride.  She developed diarrhea after taking 1 mg dose.  Half a tablet as well as one fourth of a tablet still induced diarrhea.  She was therefore advised to stop this medication.  Patient states since her last visit of 01/24/2021 she only had 1 episode of vomiting about 2 weeks ago.  She ate two beef tacos around 6 PM and she vomited around 2 AM. She takes Tums sporadically for heartburn.  She decided to stop all her medications except carvedilol and hydralazine and says she feels much better. She has gained 6 pounds since her last visit.  She generally has 3-4  stools per day and all of her stools are formed.   Current Medications: Outpatient Encounter Medications as of 05/23/2021  Medication Sig   calcium carbonate (TUMS - DOSED IN MG ELEMENTAL CALCIUM) 500 MG chewable tablet Chew 1 tablet (200 mg of elemental calcium total) by mouth 2 (two) times daily with a meal. (Patient taking differently: Chew 200 mg of elemental calcium by mouth daily as needed for indigestion or heartburn.)   carvedilol (COREG) 12.5 MG tablet Take 12.5 mg by mouth daily.   famotidine (PEPCID) 20 MG tablet Take 1 tablet (20 mg total) by mouth at bedtime.   hydrALAZINE (APRESOLINE) 50 MG tablet Take 50 mg by mouth 2 (two) times a day.   lansoprazole (PREVACID) 30 MG capsule Take 1 capsule (30 mg total) by mouth daily before breakfast.   calcium acetate, Phos Binder, (PHOSLYRA) 667 MG/5ML SOLN Take 10 mLs by mouth 3 (three) times daily with meals. (Patient not taking: Reported on 05/23/2021)   HYDROcodone-acetaminophen (NORCO/VICODIN) 5-325 MG tablet Take 1 tablet by mouth every 6 (six) hours as needed for moderate pain. (Patient not taking: Reported on 05/23/2021)   multivitamin (RENA-VIT) TABS tablet Take 1 tablet by mouth at bedtime. (Patient not taking: Reported on 05/23/2021)   ondansetron (ZOFRAN) 8 MG tablet Take 8 mg by mouth every 12 (twelve) hours as needed for nausea or vomiting. (Patient not taking: Reported on 05/23/2021)   potassium chloride (KLOR-CON) 10 MEQ tablet Take 10 mEq by mouth 2 (two) times daily. (Patient not taking: No sig reported)   Prucalopride Succinate (MOTEGRITY) 1 MG TABS Take 1 mg by mouth daily before breakfast. (Patient not taking: Reported on 05/23/2021)  rOPINIRole (REQUIP) 0.5 MG tablet Take 0.75 mg by mouth at bedtime. (Patient not taking: Reported on 05/23/2021)   Vitamin D, Cholecalciferol, 50 MCG (2000 UT) CAPS Take 2,000 Units by mouth daily.  (Patient not taking: Reported on 05/23/2021)   No facility-administered encounter medications on file  as of 05/23/2021.     Objective: Blood pressure (!) 144/83, pulse 86, temperature 98.2 F (36.8 C), temperature source Oral, height 4\' 11"  (1.499 m), weight 128 lb 6.4 oz (58.2 kg). Patient is alert and in no acute distress Conjunctiva is pink. Sclera is nonicteric Oropharyngeal mucosa is normal. No neck masses or thyromegaly noted. Cardiac exam with regular rhythm normal S1 and S2. No murmur or gallop noted. Lungs are clear to auscultation. Abdomen she has PD cannula in place with entry site in right upper quadrant.  No succussion splash noted in epigastric region.  Abdomen is soft and nontender with organomegaly or masses. No LE edema or clubbing noted.  Labs/studies Results:  Gastric emptying study performed on 02/09/2021  21% emptying at 1 hour(normal more than 10%) 34% emptying at 2 hours(more than 40%) 46% emptied at 3 hours(normal more than 70%) 71% emptying at 4 hours(normal more than 90%)   Assessment:  #1.  Recurrent nausea and vomiting secondary to gastroparesis most likely due to end-stage renal disease.  Short-term trial with low-dose metoclopramide provided some relief but this medication cannot be recommended for long-term use given her age. She did not do well with prucalopride which has beneficial effect on gastric motility. She is now on dietary measures and appears to be doing reasonably well.  Therefore we will continue to follow.  If symptoms become intractable would consider domperidone at a low dose which [place] in [place].  #2.  Chronic dry cough.  This could be due to his silent reflux or postnasal discharge.  She is not producing any sputum when she does not have wheezing.  Trial with plain Robitussin would be reasonable.  #2.  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  Would consider repeat MR in May 2024 per guidelines.  Plan  Plain Robitussin  liquid as needed for cough. If cough persists consider follow-up with PCP to rule out pulmonary source of cough. Office visit in 6 months.

## 2021-05-23 NOTE — Patient Instructions (Addendum)
Notify vomiting recurs. Can try plain Robitussin for cough.  If Robitussin does not help we will consider Tessalon

## 2021-08-11 ENCOUNTER — Other Ambulatory Visit (HOSPITAL_COMMUNITY): Payer: Self-pay | Admitting: Rehabilitation

## 2021-08-11 ENCOUNTER — Other Ambulatory Visit: Payer: Self-pay | Admitting: Rehabilitation

## 2021-08-11 DIAGNOSIS — M48062 Spinal stenosis, lumbar region with neurogenic claudication: Secondary | ICD-10-CM

## 2021-08-11 DIAGNOSIS — M4316 Spondylolisthesis, lumbar region: Secondary | ICD-10-CM

## 2021-08-26 ENCOUNTER — Emergency Department (HOSPITAL_COMMUNITY): Payer: Medicare Other

## 2021-08-26 ENCOUNTER — Encounter (HOSPITAL_COMMUNITY): Payer: Self-pay | Admitting: *Deleted

## 2021-08-26 ENCOUNTER — Inpatient Hospital Stay (HOSPITAL_COMMUNITY)
Admission: EM | Admit: 2021-08-26 | Discharge: 2021-09-07 | DRG: 867 | Disposition: A | Discharge status: Rehabilitation Facility | Payer: Medicare Other | Attending: Student | Admitting: Student

## 2021-08-26 DIAGNOSIS — E872 Acidosis, unspecified: Secondary | ICD-10-CM | POA: Diagnosis present

## 2021-08-26 DIAGNOSIS — G9341 Metabolic encephalopathy: Secondary | ICD-10-CM

## 2021-08-26 DIAGNOSIS — N2581 Secondary hyperparathyroidism of renal origin: Secondary | ICD-10-CM | POA: Diagnosis present

## 2021-08-26 DIAGNOSIS — R197 Diarrhea, unspecified: Secondary | ICD-10-CM | POA: Diagnosis present

## 2021-08-26 DIAGNOSIS — N186 End stage renal disease: Secondary | ICD-10-CM | POA: Diagnosis present

## 2021-08-26 DIAGNOSIS — I120 Hypertensive chronic kidney disease with stage 5 chronic kidney disease or end stage renal disease: Secondary | ICD-10-CM | POA: Diagnosis present

## 2021-08-26 DIAGNOSIS — R0602 Shortness of breath: Secondary | ICD-10-CM

## 2021-08-26 DIAGNOSIS — Z882 Allergy status to sulfonamides status: Secondary | ICD-10-CM

## 2021-08-26 DIAGNOSIS — Z79899 Other long term (current) drug therapy: Secondary | ICD-10-CM

## 2021-08-26 DIAGNOSIS — D509 Iron deficiency anemia, unspecified: Secondary | ICD-10-CM | POA: Insufficient documentation

## 2021-08-26 DIAGNOSIS — K3184 Gastroparesis: Secondary | ICD-10-CM | POA: Diagnosis present

## 2021-08-26 DIAGNOSIS — E876 Hypokalemia: Secondary | ICD-10-CM | POA: Diagnosis present

## 2021-08-26 DIAGNOSIS — R531 Weakness: Secondary | ICD-10-CM

## 2021-08-26 DIAGNOSIS — Z881 Allergy status to other antibiotic agents status: Secondary | ICD-10-CM

## 2021-08-26 DIAGNOSIS — J9811 Atelectasis: Secondary | ICD-10-CM | POA: Diagnosis present

## 2021-08-26 DIAGNOSIS — L299 Pruritus, unspecified: Secondary | ICD-10-CM | POA: Diagnosis present

## 2021-08-26 DIAGNOSIS — K5900 Constipation, unspecified: Secondary | ICD-10-CM | POA: Diagnosis present

## 2021-08-26 DIAGNOSIS — T8029XA Infection following other infusion, transfusion and therapeutic injection, initial encounter: Secondary | ICD-10-CM | POA: Diagnosis present

## 2021-08-26 DIAGNOSIS — I10 Essential (primary) hypertension: Secondary | ICD-10-CM | POA: Diagnosis not present

## 2021-08-26 DIAGNOSIS — M810 Age-related osteoporosis without current pathological fracture: Secondary | ICD-10-CM | POA: Insufficient documentation

## 2021-08-26 DIAGNOSIS — A419 Sepsis, unspecified organism: Secondary | ICD-10-CM | POA: Diagnosis present

## 2021-08-26 DIAGNOSIS — N3281 Overactive bladder: Secondary | ICD-10-CM | POA: Insufficient documentation

## 2021-08-26 DIAGNOSIS — D539 Nutritional anemia, unspecified: Secondary | ICD-10-CM | POA: Diagnosis present

## 2021-08-26 DIAGNOSIS — Z20822 Contact with and (suspected) exposure to covid-19: Secondary | ICD-10-CM | POA: Diagnosis present

## 2021-08-26 DIAGNOSIS — F32A Depression, unspecified: Secondary | ICD-10-CM | POA: Diagnosis present

## 2021-08-26 DIAGNOSIS — Z8249 Family history of ischemic heart disease and other diseases of the circulatory system: Secondary | ICD-10-CM

## 2021-08-26 DIAGNOSIS — G928 Other toxic encephalopathy: Secondary | ICD-10-CM | POA: Diagnosis present

## 2021-08-26 DIAGNOSIS — T361X5A Adverse effect of cephalosporins and other beta-lactam antibiotics, initial encounter: Secondary | ICD-10-CM | POA: Diagnosis present

## 2021-08-26 DIAGNOSIS — E782 Mixed hyperlipidemia: Secondary | ICD-10-CM | POA: Diagnosis present

## 2021-08-26 DIAGNOSIS — M109 Gout, unspecified: Secondary | ICD-10-CM | POA: Diagnosis present

## 2021-08-26 DIAGNOSIS — R5381 Other malaise: Secondary | ICD-10-CM | POA: Diagnosis not present

## 2021-08-26 DIAGNOSIS — F410 Panic disorder [episodic paroxysmal anxiety] without agoraphobia: Secondary | ICD-10-CM | POA: Insufficient documentation

## 2021-08-26 DIAGNOSIS — R6520 Severe sepsis without septic shock: Secondary | ICD-10-CM | POA: Diagnosis not present

## 2021-08-26 DIAGNOSIS — E8809 Other disorders of plasma-protein metabolism, not elsewhere classified: Secondary | ICD-10-CM | POA: Diagnosis present

## 2021-08-26 DIAGNOSIS — F22 Delusional disorders: Secondary | ICD-10-CM | POA: Diagnosis present

## 2021-08-26 DIAGNOSIS — Z6822 Body mass index (BMI) 22.0-22.9, adult: Secondary | ICD-10-CM | POA: Insufficient documentation

## 2021-08-26 DIAGNOSIS — R198 Other specified symptoms and signs involving the digestive system and abdomen: Secondary | ICD-10-CM

## 2021-08-26 DIAGNOSIS — Y841 Kidney dialysis as the cause of abnormal reaction of the patient, or of later complication, without mention of misadventure at the time of the procedure: Secondary | ICD-10-CM | POA: Diagnosis present

## 2021-08-26 DIAGNOSIS — T8571XD Infection and inflammatory reaction due to peritoneal dialysis catheter, subsequent encounter: Secondary | ICD-10-CM | POA: Diagnosis not present

## 2021-08-26 DIAGNOSIS — R0609 Other forms of dyspnea: Secondary | ICD-10-CM

## 2021-08-26 DIAGNOSIS — K219 Gastro-esophageal reflux disease without esophagitis: Secondary | ICD-10-CM | POA: Diagnosis present

## 2021-08-26 DIAGNOSIS — M898X9 Other specified disorders of bone, unspecified site: Secondary | ICD-10-CM | POA: Diagnosis present

## 2021-08-26 DIAGNOSIS — D631 Anemia in chronic kidney disease: Secondary | ICD-10-CM | POA: Diagnosis present

## 2021-08-26 DIAGNOSIS — R1084 Generalized abdominal pain: Secondary | ICD-10-CM

## 2021-08-26 DIAGNOSIS — R040 Epistaxis: Secondary | ICD-10-CM | POA: Insufficient documentation

## 2021-08-26 DIAGNOSIS — G253 Myoclonus: Secondary | ICD-10-CM | POA: Diagnosis present

## 2021-08-26 DIAGNOSIS — E440 Moderate protein-calorie malnutrition: Secondary | ICD-10-CM | POA: Diagnosis present

## 2021-08-26 DIAGNOSIS — R252 Cramp and spasm: Secondary | ICD-10-CM | POA: Insufficient documentation

## 2021-08-26 DIAGNOSIS — E559 Vitamin D deficiency, unspecified: Secondary | ICD-10-CM | POA: Insufficient documentation

## 2021-08-26 DIAGNOSIS — M19019 Primary osteoarthritis, unspecified shoulder: Secondary | ICD-10-CM | POA: Insufficient documentation

## 2021-08-26 DIAGNOSIS — R4701 Aphasia: Secondary | ICD-10-CM | POA: Diagnosis present

## 2021-08-26 DIAGNOSIS — Z6824 Body mass index (BMI) 24.0-24.9, adult: Secondary | ICD-10-CM

## 2021-08-26 DIAGNOSIS — G929 Unspecified toxic encephalopathy: Secondary | ICD-10-CM | POA: Diagnosis not present

## 2021-08-26 DIAGNOSIS — M5430 Sciatica, unspecified side: Secondary | ICD-10-CM | POA: Insufficient documentation

## 2021-08-26 DIAGNOSIS — R410 Disorientation, unspecified: Secondary | ICD-10-CM | POA: Diagnosis not present

## 2021-08-26 DIAGNOSIS — Z992 Dependence on renal dialysis: Secondary | ICD-10-CM | POA: Diagnosis present

## 2021-08-26 DIAGNOSIS — T8571XA Infection and inflammatory reaction due to peritoneal dialysis catheter, initial encounter: Secondary | ICD-10-CM | POA: Diagnosis present

## 2021-08-26 DIAGNOSIS — R112 Nausea with vomiting, unspecified: Secondary | ICD-10-CM | POA: Diagnosis present

## 2021-08-26 DIAGNOSIS — G2581 Restless legs syndrome: Secondary | ICD-10-CM | POA: Insufficient documentation

## 2021-08-26 DIAGNOSIS — K652 Spontaneous bacterial peritonitis: Secondary | ICD-10-CM

## 2021-08-26 DIAGNOSIS — R911 Solitary pulmonary nodule: Secondary | ICD-10-CM | POA: Diagnosis present

## 2021-08-26 DIAGNOSIS — N183 Chronic kidney disease, stage 3 unspecified: Secondary | ICD-10-CM | POA: Insufficient documentation

## 2021-08-26 HISTORY — DX: Contusion of lower back and pelvis, initial encounter: S30.0XXA

## 2021-08-26 HISTORY — DX: Polyp of stomach and duodenum: K31.7

## 2021-08-26 HISTORY — DX: Spondylosis, unspecified: M47.9

## 2021-08-26 HISTORY — DX: Gastroparesis: K31.84

## 2021-08-26 LAB — CBC WITH DIFFERENTIAL/PLATELET
Abs Immature Granulocytes: 0.07 10*3/uL (ref 0.00–0.07)
Basophils Absolute: 0 10*3/uL (ref 0.0–0.1)
Basophils Relative: 0 %
Eosinophils Absolute: 0 10*3/uL (ref 0.0–0.5)
Eosinophils Relative: 0 %
HCT: 27.4 % — ABNORMAL LOW (ref 36.0–46.0)
Hemoglobin: 9.7 g/dL — ABNORMAL LOW (ref 12.0–15.0)
Immature Granulocytes: 1 %
Lymphocytes Relative: 3 %
Lymphs Abs: 0.4 10*3/uL — ABNORMAL LOW (ref 0.7–4.0)
MCH: 36.6 pg — ABNORMAL HIGH (ref 26.0–34.0)
MCHC: 35.4 g/dL (ref 30.0–36.0)
MCV: 103.4 fL — ABNORMAL HIGH (ref 80.0–100.0)
Monocytes Absolute: 0.6 10*3/uL (ref 0.1–1.0)
Monocytes Relative: 4 %
Neutro Abs: 13.7 10*3/uL — ABNORMAL HIGH (ref 1.7–7.7)
Neutrophils Relative %: 92 %
Platelets: 242 10*3/uL (ref 150–400)
RBC: 2.65 MIL/uL — ABNORMAL LOW (ref 3.87–5.11)
RDW: 12.8 % (ref 11.5–15.5)
WBC: 14.8 10*3/uL — ABNORMAL HIGH (ref 4.0–10.5)
nRBC: 0 % (ref 0.0–0.2)

## 2021-08-26 LAB — RESP PANEL BY RT-PCR (FLU A&B, COVID) ARPGX2
Influenza A by PCR: NEGATIVE
Influenza B by PCR: NEGATIVE
SARS Coronavirus 2 by RT PCR: NEGATIVE

## 2021-08-26 LAB — LACTIC ACID, PLASMA
Lactic Acid, Venous: 3 mmol/L (ref 0.5–1.9)
Lactic Acid, Venous: 2.6 mmol/L (ref 0.5–1.9)

## 2021-08-26 LAB — COMPREHENSIVE METABOLIC PANEL
ALT: 27 U/L (ref 0–44)
AST: 24 U/L (ref 15–41)
Albumin: 2.9 g/dL — ABNORMAL LOW (ref 3.5–5.0)
Alkaline Phosphatase: 71 U/L (ref 38–126)
Anion gap: 16 — ABNORMAL HIGH (ref 5–15)
BUN: 73 mg/dL — ABNORMAL HIGH (ref 8–23)
CO2: 24 mmol/L (ref 22–32)
Calcium: 9 mg/dL (ref 8.9–10.3)
Chloride: 95 mmol/L — ABNORMAL LOW (ref 98–111)
Creatinine, Ser: 10.43 mg/dL — ABNORMAL HIGH (ref 0.44–1.00)
GFR, Estimated: 3 mL/min — ABNORMAL LOW (ref >60)
Glucose, Bld: 114 mg/dL — ABNORMAL HIGH (ref 70–99)
Potassium: 3.7 mmol/L (ref 3.5–5.1)
Sodium: 135 mmol/L (ref 135–145)
Total Bilirubin: 0.4 mg/dL (ref 0.3–1.2)
Total Protein: 6.5 g/dL (ref 6.5–8.1)

## 2021-08-26 LAB — LIPASE, BLOOD: Lipase: 66 U/L — ABNORMAL HIGH (ref 11–51)

## 2021-08-26 MED ORDER — SODIUM CHLORIDE 0.9 % IV SOLN
1.0000 g | INTRAVENOUS | Status: DC
  Administered 2021-08-27 – 2021-08-29 (×4): 1 g via INTRAVENOUS
  Filled 2021-08-26 (×5): qty 1

## 2021-08-26 MED ORDER — VANCOMYCIN HCL 1250 MG/250ML IV SOLN
1250.0000 mg | Freq: Once | INTRAVENOUS | Status: AC
  Administered 2021-08-26: 1250 mg via INTRAVENOUS
  Filled 2021-08-26: qty 250

## 2021-08-26 MED ORDER — MORPHINE SULFATE (PF) 2 MG/ML IV SOLN
2.0000 mg | INTRAVENOUS | Status: DC | PRN
  Administered 2021-08-26 – 2021-08-27 (×3): 2 mg via INTRAVENOUS
  Filled 2021-08-26 (×4): qty 1

## 2021-08-26 MED ORDER — CEFTRIAXONE SODIUM 2 G IJ SOLR
2.0000 g | Freq: Once | INTRAMUSCULAR | Status: AC
  Administered 2021-08-26: 2 g via INTRAVENOUS
  Filled 2021-08-26: qty 20

## 2021-08-26 MED ORDER — GENTAMICIN SULFATE 0.1 % EX CREA
1.0000 "application " | TOPICAL_CREAM | Freq: Every day | CUTANEOUS | Status: DC
  Filled 2021-08-26: qty 15

## 2021-08-26 MED ORDER — HYDROMORPHONE HCL 1 MG/ML IJ SOLN
0.5000 mg | Freq: Once | INTRAMUSCULAR | Status: AC
  Administered 2021-08-26: 0.5 mg via INTRAVENOUS
  Filled 2021-08-26: qty 1

## 2021-08-26 MED ORDER — DELFLEX-LC/1.5% DEXTROSE 344 MOSM/L IP SOLN: INTRAPERITONEAL | Status: DC

## 2021-08-26 MED ORDER — DELFLEX-LC/4.25% DEXTROSE 483 MOSM/L IP SOLN: INTRAPERITONEAL | Status: DC

## 2021-08-26 MED ORDER — DELFLEX-LC/2.5% DEXTROSE 394 MOSM/L IP SOLN: INTRAPERITONEAL | Status: DC

## 2021-08-26 MED ORDER — SODIUM CHLORIDE 0.9 % IV BOLUS
1000.0000 mL | Freq: Once | INTRAVENOUS | Status: AC
  Administered 2021-08-26: 1000 mL via INTRAVENOUS

## 2021-08-26 MED ORDER — ONDANSETRON HCL 4 MG/2ML IJ SOLN
4.0000 mg | Freq: Once | INTRAMUSCULAR | Status: AC
  Administered 2021-08-26: 4 mg via INTRAVENOUS
  Filled 2021-08-26: qty 2

## 2021-08-26 NOTE — Assessment & Plan Note (Addendum)
Sepsis has resolved ?

## 2021-08-26 NOTE — Progress Notes (Signed)
Pharmacy Antibiotic Note  Felicia Williams is a 81 y.o. female admitted on 08/26/2021 with bacteremia/peritonitis.  Pharmacy has been consulted for Vancomycin / Cefepime dosing.  Plan: Vancomycin 1250 mg iv x 1 Future doses based on levels Cefepime 1 gram iv Q 24 hours  Weight: 58.2 kg (128 lb 4.9 oz)  Temp (24hrs), Avg:99.7 F (37.6 C), Min:99.7 F (37.6 C), Max:99.7 F (37.6 C)  Recent Labs  Lab 08/26/21 1814 08/26/21 1920  WBC 14.8*  --   CREATININE 10.43*  --   LATICACIDVEN 3.0* 2.6*    Estimated Creatinine Clearance: 3.3 mL/min (A) (by C-G formula based on SCr of 10.43 mg/dL (H)).    Allergies  Allergen Reactions   Sulfa Antibiotics Other (See Comments)    Shut pt's kidneys down    Thank you for allowing pharmacy to be a part of this patients care.  Tad Moore 08/26/2021 10:01 PM

## 2021-08-26 NOTE — ED Triage Notes (Signed)
Abdominal pain onset 1130 today

## 2021-08-26 NOTE — Assessment & Plan Note (Addendum)
Normotensive. -Continue Coreg.

## 2021-08-26 NOTE — Progress Notes (Addendum)
Brief Nephrology Note  Aware of patient. Here w/ abdominal pain. ESRD on PD with history of peritonitis and concern for the same. Do not do PD at [facility] so will need to transfer to [facility]. Nephrology nurse aware. When she arrives they will collect PD cell count and culture. PD associated peritonitis typically treated with ceftaz/cefepime + vancomycin initially and then narrowed. Antibiotics per primary team. Patient will be formally seen tomorrow.

## 2021-08-26 NOTE — Assessment & Plan Note (Addendum)
-  Continue  H2 blockers, prior home meds.

## 2021-08-26 NOTE — Assessment & Plan Note (Addendum)
Peritoneal dialysis per nephrology.

## 2021-08-26 NOTE — H&P (Signed)
History and Physical    Patient: Felicia Williams ERD:[id number] DOB: [birth year] DOA: 08/26/2021 DOS: the patient was seen and examined on 08/26/2021 PCP: [name], NP  Patient coming from: Home  Chief Complaint:  Chief Complaint  Patient presents with   Abdominal Pain    HPI: Felicia Williams is a 81 y.o. female with medical history significant of anemia of chronic disease, ESRD on peritoneal dialysis, hypertension, GERD, mixed hyperlipidemia, and more presents to ED with a chief complaint of peritonitis.  Patient reports that she was peritonitis because she had the same 2 years ago.  She reports she had sudden onset of constant, stabbing, diffuse pain in her abdomen.  Her abdomen was no more painful in 1 area than any other.  She reports the pain was severe.  Medication in the ER made it better, nothing made it worse.  She had no associated fever, diaphoresis or rigors.  After several hours of pain she decided she had to come into the ED.  Of note patient is on peritoneal dialysis.  She does not know if her effluent fluid was cloudy for the most recent PD because it drains into the commode.  She does report that 2 nights ago the machine woke her up in the middle the night saying there was a leak advising her to turn it off.  Unrelated, patient also complains of decreased appetite.  This seems to be a more chronic issue over months.  Daughter at bedside reports patient barely eats.  Patient does have protein calorie malnutrition on her lab work.  She described her diet as a bowl of corn flakes in the morning, maybe 2 cookies later.  She drinks sweet tea all day long.  She does not drink any water.  She reports is hard to know if she is lost weight because of the fluid in her abdomen.  Nutrition consult requested.  Patient has no other complaints at this time.  Patient does not smoke, does not drink alcohol, does not use illicit drugs.  Patient is vaccinated for COVID.  Patient is full  code.  Review of Systems: As mentioned in the history of present illness. All other systems reviewed and are negative. Past Medical History:  Diagnosis Date   Anemia in stage 4 chronic kidney disease ([place]) 08/18/2018   Depression    Essential hypertension, benign    GERD (gastroesophageal reflux disease)    Glomerulonephritis    Gout    Mixed hyperlipidemia    Renal insufficiency    Past Surgical History:  Procedure Laterality Date   APPENDECTOMY     CATARACT EXTRACTION     COLONOSCOPY N/A 12/28/2015   Procedure: COLONOSCOPY;  Surgeon: Rogene Houston, MD;  Location: [facility];  Service: Endoscopy;  Laterality: N/A;  815   ESOPHAGOGASTRODUODENOSCOPY N/A 11/16/2020   Procedure: ESOPHAGOGASTRODUODENOSCOPY (EGD);  Surgeon: Rogene Houston, MD;  Location: [facility];  Service: Endoscopy;  Laterality: N/A;  1:15   IR FLUORO GUIDE CV LINE RIGHT  01/16/2019   IR REMOVAL TUN CV CATH W/O FL  07/01/2019   IR US GUIDE VASC ACCESS RIGHT  01/16/2019   POLYPECTOMY  11/16/2020   Procedure: POLYPECTOMY;  Surgeon: Rogene Houston, MD;  Location: [facility];  Service: Endoscopy;;  gastric   TUBAL LIGATION     Social History:  reports that she has never smoked. She has never used smokeless tobacco. She reports that she does not drink alcohol and does not use drugs.  Allergies  Allergen Reactions   Sulfa Antibiotics Other (See Comments)    Shut pt's kidneys down    Family History  Problem Relation Age of Onset   CAD Father    Heart attack Father    Diabetes Mellitus II Father    Hypertension Father    Lupus Brother     Prior to Admission medications   Medication Sig Start Date End Date Taking? Authorizing Provider  calcium acetate, Phos Binder, (PHOSLYRA) 667 MG/5ML SOLN Take 10 mLs by mouth 3 (three) times daily with meals. Patient not taking: Reported on 05/23/2021    [provider]  calcium carbonate (TUMS - DOSED IN MG ELEMENTAL CALCIUM) 500 MG chewable tablet Chew 1  tablet (200 mg of elemental calcium total) by mouth 2 (two) times daily with a meal. Patient taking differently: Chew 200 mg of elemental calcium by mouth daily as needed for indigestion or heartburn. 01/20/19   Nita Sells, MD  carvedilol (COREG) 12.5 MG tablet Take 12.5 mg by mouth daily. 03/01/15   [provider]  famotidine (PEPCID) 20 MG tablet Take 1 tablet (20 mg total) by mouth at bedtime. 11/14/20   Rogene Houston, MD  hydrALAZINE (APRESOLINE) 50 MG tablet Take 50 mg by mouth 2 (two) times a day. 09/15/18   [provider]  HYDROcodone-acetaminophen (NORCO/VICODIN) 5-325 MG tablet Take 1 tablet by mouth every 6 (six) hours as needed for moderate pain. Patient not taking: Reported on 05/23/2021    [provider]  lansoprazole (PREVACID) 30 MG capsule Take 1 capsule (30 mg total) by mouth daily before breakfast. 01/24/21   Rehman, Mechele Dawley, MD  multivitamin (RENA-VIT) TABS tablet Take 1 tablet by mouth at bedtime. Patient not taking: Reported on 05/23/2021 01/20/19   Nita Sells, MD  ondansetron (ZOFRAN) 8 MG tablet Take 8 mg by mouth every 12 (twelve) hours as needed for nausea or vomiting. Patient not taking: Reported on 05/23/2021    [provider]  potassium chloride (KLOR-CON) 10 MEQ tablet Take 10 mEq by mouth 2 (two) times daily. Patient not taking: No sig reported    [provider]  rOPINIRole (REQUIP) 0.5 MG tablet Take 0.75 mg by mouth at bedtime. Patient not taking: Reported on 05/23/2021    [provider]  Vitamin D, Cholecalciferol, 50 MCG (2000 UT) CAPS Take 2,000 Units by mouth daily.  Patient not taking: Reported on 05/23/2021    [provider]    Physical Exam: Vitals:   08/26/21 1930 08/26/21 2000 08/26/21 2030 08/26/21 2100  BP: (!) 136/53 (!) 119/53 (!) 126/51   Pulse: 95 86 84 87  Resp:  12 (!) 22 20  Temp:      TempSrc:      SpO2: 98% 98% 97% 99%   1.  General: Patient lying supine  in bed,  no acute distress   2. Psychiatric: Alert and oriented x 3, mood and behavior normal for situation, pleasant and cooperative with exam   3. Neurologic: Speech and language are normal, face is symmetric, moves all 4 extremities voluntarily, at baseline without acute deficits on limited exam   4. HEENMT:  Head is atraumatic, normocephalic, pupils reactive to light, neck is supple, trachea is midline, mucous membranes are moist   5. Respiratory : Lungs are clear to auscultation bilaterally without wheezing, rhonchi, rales, no cyanosis, no increase in work of breathing or accessory muscle use   6. Cardiovascular : Heart rate normal, rhythm is regular, no murmurs,  rubs or gallops, no peripheral edema, peripheral pulses palpated   7. Gastrointestinal:  Abdomen is soft, distended with fluid, diffuse tenderness, guarding, no localization, bowel sounds active, peritoneal dialysis catheter capped and dressed   8. Skin:  Skin is warm, dry and intact without rashes, acute lesions, or ulcers on limited exam   9.Musculoskeletal:  No acute deformities or trauma, no asymmetry in tone, no peripheral edema, peripheral pulses palpated, no tenderness to palpation in the extremities   Data Reviewed: In the ED Patient is a leukocytosis of 14.8, she was tachycardic at presentation, she has a lactic acidosis 3.0 on repeat is 2.6-meet sepsis criteria CT abdomen shows no acute intra-abdominal pathology identified.  There is a incidental 5 mm right solid pulmonary nodule, multivessel coronary artery calcification, and renal atrophy. Patient was started on 2 g Rocephin in the ED, given Dilaudid, Zofran, 1 L normal saline, and UA is pending-patient reports she makes very little urine  Assessment and Plan: * Spontaneous bacterial peritonitis ([place])- (present on admission) Peritoneal dialysis associated spontaneous bacterial peritonitis Broadening antibiotics to cefepime and vancomycin Patient does  report a weak during PD 2 nights ago, possibly contamination-at this time? We do not do PD here in any pain so patient will be transferred to [facility] Nephrology notified of patient and recommendations are appreciated Continue to monitor  Sepsis [facility])- (present on admission) Tachycardia, leukocytosis, lactic acidosis with a suspected source of SBP We will hold off on full fluid boluses patient's blood pressure has been stable and she is a dialysis patient Rocephin started in ED, broaden antibiotics to cefepime and vancomycin Sepsis order set utilized Check cortisol and procalcitonin in the a.m. Trend leukocytosis in the a.m. Continue to trend lactic acid through the night Blood cultures drawn and peritoneal fluid cultures to be drawn upon arrival to [place] to monitor  End stage renal disease [facility])- (present on admission) Nephrology consulted Patient transferring to [facility] for peritoneal dialysis care  Gastroesophageal reflux disease- (present on admission) Continue Protonix  Essential hypertension, benign- (present on admission) Continue hydralazine and Coreg       Advance Care Planning:   Code Status: Prior full  Consults: Nephrology  Family Communication: Daughter at bedside  Severity of Illness: The appropriate patient status for this patient is INPATIENT. Inpatient status is judged to be reasonable and necessary in order to provide the required intensity of service to ensure the patient's safety. The patient's presenting symptoms, physical exam findings, and initial radiographic and laboratory data in the context of their chronic comorbidities is felt to place them at high risk for further clinical deterioration. Furthermore, it is not anticipated that the patient will be medically stable for discharge from the hospital within 2 midnights of admission.   * I certify that at the point of admission it is my clinical judgment that the patient will require  inpatient hospital care spanning beyond 2 midnights from the point of admission due to high intensity of service, high risk for further deterioration and high frequency of surveillance required.*  Author: Rolla Plate, DO 08/26/2021 9:48 PM  For on call review [email or web address].

## 2021-08-26 NOTE — ED Provider Notes (Signed)
[facility] EMERGENCY DEPARTMENT Provider Note   CSN: [id number] Arrival date & time: 08/26/21  1723     History  Chief Complaint  Patient presents with   Abdominal Pain    Felicia Williams is a 81 y.o. female.  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  States it started today came on suddenly describes as a constant like sensation states is generalized does not radiate into her back does not feel like a stabbing-like or tearing like sensation, has associated nausea and vomiting denies hematemesis or coffee-ground emesis admit to use some constipation had a bowel movement today was slightly hard denies any melena or hematochezia, patient states that she only makes  a little bit amount of urine denies any discharge or vaginal/pelvic tenderness.abdominal history includes appendectomy tubal ligation, esophagitis as well as GERD no history of stomach ulcers or bowel obstructions.  She notes that she has a stuffy nose as well as a nonproductive cough she states this is constant not any worse than usual, denies any sore throat general body aches denies any recent sick contacts.  She is up-to-date on her COVID and influenza vaccine, states that she has had spontaneous bacterial peritonitis and is concerned that she has this.  Denies any drainage or redness around the dialysis catheter states that she had her full treatment yesterday  Denies any worsening chest pain shortness of breath peripheral edema orthopnea.  Reviewed patient's chart patient had a peritoneal catheter for 2 years time was placed by Dr. Raul Del over at [facility], has surgical history including  Home Medications Prior to Admission medications   Medication Sig Start Date End Date Taking? Authorizing Provider  calcium acetate, Phos Binder, (PHOSLYRA) 667 MG/5ML SOLN Take 10 mLs by  mouth 3 (three) times daily with meals. Patient not taking: Reported on 05/23/2021    [provider]  calcium carbonate (TUMS - DOSED IN MG ELEMENTAL CALCIUM) 500 MG chewable tablet Chew 1 tablet (200 mg of elemental calcium total) by mouth 2 (two) times daily with a meal. Patient taking differently: Chew 200 mg of elemental calcium by mouth daily as needed for indigestion or heartburn. 01/20/19   Nita Sells, MD  carvedilol (COREG) 12.5 MG tablet Take 12.5 mg by mouth daily. 03/01/15   [provider]  famotidine (PEPCID) 20 MG tablet Take 1 tablet (20 mg total) by mouth at bedtime. 11/14/20   Rogene Houston, MD  hydrALAZINE (APRESOLINE) 50 MG tablet Take 50 mg by mouth 2 (two) times a day. 09/15/18   [provider]  HYDROcodone-acetaminophen (NORCO/VICODIN) 5-325 MG tablet Take 1 tablet by mouth every 6 (six) hours as needed for moderate pain. Patient not taking: Reported on 05/23/2021    [provider]  lansoprazole (PREVACID) 30 MG capsule Take 1 capsule (30 mg total) by mouth daily before breakfast. 01/24/21   Rehman, Mechele Dawley, MD  multivitamin (RENA-VIT) TABS tablet Take 1 tablet by mouth at bedtime. Patient not taking: Reported on 05/23/2021 01/20/19   Nita Sells, MD  ondansetron (ZOFRAN) 8 MG tablet Take 8 mg by mouth every 12 (twelve) hours as needed for nausea or vomiting. Patient not taking: Reported on 05/23/2021    [provider]  potassium chloride (KLOR-CON) 10 MEQ tablet Take 10 mEq by mouth 2 (two) times daily. Patient not taking: No sig reported    [provider]  rOPINIRole (REQUIP) 0.5 MG  tablet Take 0.75 mg by mouth at bedtime. Patient not taking: Reported on 05/23/2021    [provider]  Vitamin D, Cholecalciferol, 50 MCG (2000 UT) CAPS Take 2,000 Units by mouth daily.  Patient not taking: Reported on 05/23/2021    [provider]      Allergies    Sulfa antibiotics    Review of Systems    Review of Systems  Constitutional:  Negative for chills and fever.  Respiratory:  Negative for shortness of breath.   Cardiovascular:  Negative for chest pain.  Gastrointestinal:  Positive for abdominal pain, constipation, nausea and vomiting. Negative for blood in stool.  Neurological:  Negative for headaches.   Physical Exam Updated Vital Signs BP (!) 126/51    Pulse 87    Temp 99.7 F (37.6 C) (Oral)    Resp 20    SpO2 99%  Physical Exam Vitals and nursing note reviewed.  Constitutional:      General: She is not in acute distress.    Appearance: She is not ill-appearing.  HENT:     Head: Normocephalic and atraumatic.     Nose: No congestion.     Mouth/Throat:     Mouth: Mucous membranes are moist.     Pharynx: Oropharynx is clear. No oropharyngeal exudate or posterior oropharyngeal erythema.  Eyes:     Conjunctiva/sclera: Conjunctivae normal.  Cardiovascular:     Rate and Rhythm: Regular rhythm. Tachycardia present.     Pulses: Normal pulses.     Heart sounds: No murmur heard.   No friction rub. No gallop.  Pulmonary:     Effort: No respiratory distress.     Breath sounds: No wheezing, rhonchi or rales.  Abdominal:     Palpations: Abdomen is soft.     Tenderness: There is abdominal tenderness. There is guarding and rebound. There is no right CVA tenderness or left CVA tenderness.     Comments: Peritoneal catheter presents no surrounding erythema or edema presents no drainage or discharge noted, abdomen slightly distended dull to percussion normal bowel sounds generalized abdominal tenderness with guarding, no peritoneal sign negative Murphy sign McBurney point.  Musculoskeletal:     Right lower leg: No edema.     Left lower leg: No edema.  Skin:    General: Skin is warm and dry.  Neurological:     Mental Status: She is alert.  Psychiatric:        Mood and Affect: Mood normal.    ED Results / Procedures / Treatments   Labs (all labs ordered are listed, but only  abnormal results are displayed) Labs Reviewed  COMPREHENSIVE METABOLIC PANEL - Abnormal; Notable for the following components:      Result Value   Chloride 95 (*)    Glucose, Bld 114 (*)    BUN 73 (*)    Creatinine, Ser 10.43 (*)    Albumin 2.9 (*)    GFR, Estimated 3 (*)    Anion gap 16 (*)    All other components within normal limits  CBC WITH DIFFERENTIAL/PLATELET - Abnormal; Notable for the following components:   WBC 14.8 (*)    RBC 2.65 (*)    Hemoglobin 9.7 (*)    HCT 27.4 (*)    MCV 103.4 (*)    MCH 36.6 (*)    Neutro Abs 13.7 (*)    Lymphs Abs 0.4 (*)    All other components within normal limits  LACTIC ACID, PLASMA - Abnormal; Notable for the  following components:   Lactic Acid, Venous 3.0 (*)    All other components within normal limits  LACTIC ACID, PLASMA - Abnormal; Notable for the following components:   Lactic Acid, Venous 2.6 (*)    All other components within normal limits  LIPASE, BLOOD - Abnormal; Notable for the following components:   Lipase 66 (*)    All other components within normal limits  CULTURE, BLOOD (ROUTINE X 2)  CULTURE, BLOOD (ROUTINE X 2)  RESP PANEL BY RT-PCR (FLU A&B, COVID) ARPGX2  BODY FLUID CULTURE W GRAM STAIN  URINALYSIS, ROUTINE W REFLEX MICROSCOPIC  BODY FLUID CELL COUNT WITH DIFFERENTIAL    EKG None  Radiology CT ABDOMEN PELVIS WO CONTRAST  Result Date: 08/26/2021 CLINICAL DATA:  Abdominal pain, acute, nonlocalized. End-stage renal disease on peritoneal dialysis. Remote history peritonitis. EXAM: CT ABDOMEN AND PELVIS WITHOUT CONTRAST TECHNIQUE: Multidetector CT imaging of the abdomen and pelvis was performed following the standard protocol without IV contrast. RADIATION DOSE REDUCTION: This exam was performed according to the departmental dose-optimization program which includes automated exposure control, adjustment of the mA and/or kV according to patient size and/or use of iterative reconstruction technique. COMPARISON:   None. FINDINGS: Lower chest: Mild bibasilar pulmonary fibrotic change. 5 mm subpleural pulmonary nodule within the right middle lobe is indeterminate. Moderate multi-vessel coronary artery calcification. Cardiac size within normal limits. Small hiatal hernia. Hepatobiliary: No focal liver abnormality is seen. No gallstones, gallbladder wall thickening, or biliary dilatation. Pancreas: Unremarkable Spleen: Unremarkable Adrenals/Urinary Tract: The adrenal glands are unremarkable. The kidneys are atrophic bilaterally in keeping with given history of chronic renal failure. No contour deforming renal masses identified. Simple cortical cyst noted within the upper pole of the left kidney. No hydronephrosis. No intrarenal or ureteral masses. The bladder is decompressed. Stomach/Bowel: Moderate ascites, nonspecific in the setting of peritoneal dialysis. Right lower quadrant peritoneal dialysis catheter seen coiled within the left anterior hemipelvis. The stomach, small bowel, and large bowel are unremarkable. Appendix absent. No free intraperitoneal gas. Vascular/Lymphatic: Extensive aortoiliac atherosclerotic calcification. No aortic aneurysm. Prominent atherosclerotic calcification at the origin of the superior mesenteric artery are most certainly results in hemodynamically significant stenosis, though this is not well assessed on this non arteriographic study. No pathologic adenopathy within the abdomen and pelvis. Reproductive: Uterus and bilateral adnexa are unremarkable. Other: Tiny bilateral fat containing inguinal hernias. Rectum unremarkable. Musculoskeletal: Degenerative changes are seen within the a lumbar spine. No acute bone abnormality. No lytic or blastic bone lesion. IMPRESSION: No acute intra-abdominal pathology identified. No definite radiographic explanation for the patient's reported symptoms. 5 mm right solid pulmonary nodule. No routine follow-up imaging is recommended per Fleischner Society Guidelines.  These guidelines do not apply to immunocompromised patients and patients with cancer. Follow up in patients with significant comorbidities as clinically warranted. For lung cancer screening, adhere to Lung-RADS guidelines. Reference: Radiology. 2017; 284(1):228-43. Moderate multi-vessel coronary artery calcification. Marked renal atrophy in keeping with chronic renal failure. Findings in keeping with peritoneal dialysis noted. Peripheral vascular disease. If there is clinical evidence of chronic mesenteric ischemia, CT arteriography may be more helpful for further evaluation. Aortic Atherosclerosis (ICD10-I70.0). Electronically Signed   By: Fidela Salisbury M.D.   On: 08/26/2021 19:28   DG Chest Portable 1 View  Result Date: 08/26/2021 CLINICAL DATA:  Cough EXAM: PORTABLE CHEST 1 VIEW COMPARISON:  01/16/2021 FINDINGS: The cardiomediastinal silhouette is unremarkable. Mild chronic peribronchial thickening and elevation of the RIGHT hemidiaphragm again noted. There is no evidence of focal airspace  disease, pulmonary edema, suspicious pulmonary nodule/mass, pleural effusion, or pneumothorax. No acute bony abnormalities are identified. IMPRESSION: No evidence of acute cardiopulmonary disease. Electronically Signed   By: Margarette Canada M.D.   On: 08/26/2021 19:11    Procedures Procedures    Medications Ordered in ED Medications  sodium chloride 0.9 % bolus 1,000 mL (has no administration in time range)  gentamicin cream (GARAMYCIN) 0.1 % 1 application (has no administration in time range)  dialysis solution 1.5% low-MG/low-CA dianeal solution (has no administration in time range)  dialysis solution 2.5% low-MG/low-CA dianeal solution (has no administration in time range)  dialysis solution 4.25% low-MG/low-CA dianeal solution (has no administration in time range)  HYDROmorphone (DILAUDID) injection 0.5 mg (0.5 mg Intravenous Given 08/26/21 1838)  ondansetron (ZOFRAN) injection 4 mg (4 mg Intravenous Given  08/26/21 1837)  cefTRIAXone (ROCEPHIN) 2 g in sodium chloride 0.9 % 100 mL IVPB (0 g Intravenous Stopped 08/26/21 2108)    ED Course/ Medical Decision Making/ A&P                           Medical Decision Making Amount and/or Complexity of Data Reviewed Labs: ordered. Radiology: ordered.  Risk Prescription drug management. Decision regarding hospitalization.  This patient presents to the ED for concern of abdominal pain, this involves an extensive number of treatment options, and is a complaint that carries with it a high risk of complications and morbidity.  The differential diagnosis includes SBP, bowel obstruction, diverticulitis, perforated stomach ulcer    Additional history obtained:  Additional history obtained from electronic medical record External records from outside source obtained and reviewed including please see HPI for further detail   Co morbidities that complicate the patient evaluation  End-stage renal disease  Social Determinants of Health:  N/A    Lab Tests:  I Ordered, and personally interpreted labs.  The pertinent results include: CBC shows leukocytosis of 14.8, hemoglobin 9.7 decreased from prior but appears to be around her baseline, CMP shows glucose of 114 creatinine 10.43 lipase 66 lactic 3-second lactic 2.6   Imaging Studies ordered:  I ordered imaging studies including chest x-ray, CT and pelvis noncontrast I independently visualized and interpreted imaging which showed chest x-ray unremarkable, CT scan negative for acute findings I agree with the radiologist interpretation   Cardiac Monitoring:  The patient was maintained on a cardiac monitor.  I personally viewed and interpreted the cardiac monitored which showed an underlying rhythm of: N/A   Medicines ordered and prescription drug management:  I ordered medication including Dilaudid, fluids for pain I have reviewed the patients home medicines and have made adjustments as  needed  Critical Interventions:  Antibiotics as I am concerned for SBP   Reevaluation:  Patient presents with some abdominal tenderness provide with fluids and pain medications, reassessed  states she is feeling better vital signs have slightly improved  will continue to monitor.  Patient has an elevated white count, and elevated lactic, patient meets SIRS criteria but not sepsis  afebrile, not hypotensive, no obvious source.  We will treat her prophylactically for s BP and recommend admission to hospitalist team patient is agreement this plan.  Consultations Obtained:  I requested consultation with the [place] hospitalist,  and discussed lab and imaging findings as well as pertinent plan - they recommend:  will admit patient in transfer to [facility] as we not have the capacity for peritoneal dialysis.   Rule out Low suspicion for bowel  obstruction, volvulus, intra-abdominal infection, intra-abdominal mass, ruptured stomach ulcer CT imaging negative these findings.  I have suspicion for GI bleed as she denies dark tarry stools, hematochezia, has no history of this, she does have a noted decrease in her hemoglobin from prior but I suspect this is likely secondary due to chronic diseases.  Low suspicion for dissection as presentation atypical etiology patient has lower factors.   Dispostion and problem list  After consideration of the diagnostic results and the patients response to treatment, I feel that the patent would benefit from admission.  Abdominal tenderness-unclear etiology but likely SBP started on antibiotics and will be transferred to [facility] for further evaluation.            Final Clinical Impression(s) / ED Diagnoses Final diagnoses:  Generalized abdominal pain    Rx / DC Orders ED Discharge Orders     None         Aron Baba 08/26/21 2117    Noemi Chapel, MD 08/27/21 [phone]

## 2021-08-26 NOTE — Assessment & Plan Note (Deleted)
Peritoneal dialysis-associated bacterial peritonitis - Continue broad antibiotic coverage pending blood culture (NGTD from 2/4) peritoneal fluid culture (sent 2/5). Gram stain +GPC and milky peritoneal fluid. Continuing vancomycin, cefepime thru IV for now, plan to administer IP vancomycin at discharge.  - Abd remains tender though WBC improved. If respiratory status and hemodynamics stable over next 24 hours, can DC home after checking for additional guidance from cultures. - Continue pain control.

## 2021-08-26 NOTE — ED Notes (Signed)
2.6 lactic reported to [place] and primary RN at this time

## 2021-08-26 NOTE — ED Triage Notes (Signed)
Patient does peritoneal dialysis and has a history of peritonitis

## 2021-08-27 LAB — BODY FLUID CELL COUNT WITH DIFFERENTIAL
Eos, Fluid: 0 %
Lymphs, Fluid: 0 %
Monocyte-Macrophage-Serous Fluid: 6 % — ABNORMAL LOW (ref 50–90)
Neutrophil Count, Fluid: 94 % — ABNORMAL HIGH (ref 0–25)
Total Nucleated Cell Count, Fluid: 8667 cu mm — ABNORMAL HIGH (ref 0–1000)

## 2021-08-27 LAB — CBC WITH DIFFERENTIAL/PLATELET
Abs Immature Granulocytes: 0.12 10*3/uL — ABNORMAL HIGH (ref 0.00–0.07)
Basophils Absolute: 0.1 10*3/uL (ref 0.0–0.1)
Basophils Relative: 0 %
Eosinophils Absolute: 0 10*3/uL (ref 0.0–0.5)
Eosinophils Relative: 0 %
HCT: 21.6 % — ABNORMAL LOW (ref 36.0–46.0)
Hemoglobin: 7.7 g/dL — ABNORMAL LOW (ref 12.0–15.0)
Immature Granulocytes: 1 %
Lymphocytes Relative: 5 %
Lymphs Abs: 1.1 10*3/uL (ref 0.7–4.0)
MCH: 36.8 pg — ABNORMAL HIGH (ref 26.0–34.0)
MCHC: 35.6 g/dL (ref 30.0–36.0)
MCV: 103.3 fL — ABNORMAL HIGH (ref 80.0–100.0)
Monocytes Absolute: 1 10*3/uL (ref 0.1–1.0)
Monocytes Relative: 5 %
Neutro Abs: 18.9 10*3/uL — ABNORMAL HIGH (ref 1.7–7.7)
Neutrophils Relative %: 89 %
Platelets: 224 10*3/uL (ref 150–400)
RBC: 2.09 MIL/uL — ABNORMAL LOW (ref 3.87–5.11)
RDW: 13 % (ref 11.5–15.5)
WBC: 21.2 10*3/uL — ABNORMAL HIGH (ref 4.0–10.5)
nRBC: 0 % (ref 0.0–0.2)

## 2021-08-27 LAB — COMPREHENSIVE METABOLIC PANEL
ALT: 23 U/L (ref 0–44)
AST: 17 U/L (ref 15–41)
Albumin: 2.5 g/dL — ABNORMAL LOW (ref 3.5–5.0)
Alkaline Phosphatase: 62 U/L (ref 38–126)
Anion gap: 17 — ABNORMAL HIGH (ref 5–15)
BUN: 72 mg/dL — ABNORMAL HIGH (ref 8–23)
CO2: 20 mmol/L — ABNORMAL LOW (ref 22–32)
Calcium: 7.9 mg/dL — ABNORMAL LOW (ref 8.9–10.3)
Chloride: 99 mmol/L (ref 98–111)
Creatinine, Ser: 10.38 mg/dL — ABNORMAL HIGH (ref 0.44–1.00)
GFR, Estimated: 3 mL/min — ABNORMAL LOW (ref >60)
Glucose, Bld: 107 mg/dL — ABNORMAL HIGH (ref 70–99)
Potassium: 4.2 mmol/L (ref 3.5–5.1)
Sodium: 136 mmol/L (ref 135–145)
Total Bilirubin: 0.3 mg/dL (ref 0.3–1.2)
Total Protein: 5.9 g/dL — ABNORMAL LOW (ref 6.5–8.1)

## 2021-08-27 LAB — GRAM STAIN

## 2021-08-27 LAB — CORTISOL-AM, BLOOD: Cortisol - AM: 12.1 ug/dL (ref 6.7–22.6)

## 2021-08-27 LAB — GLUCOSE, CAPILLARY
Glucose-Capillary: 95 mg/dL (ref 70–99)
Glucose-Capillary: 97 mg/dL (ref 70–99)

## 2021-08-27 LAB — PROTIME-INR
INR: 1.1 (ref 0.8–1.2)
Prothrombin Time: 14.5 seconds (ref 11.4–15.2)

## 2021-08-27 LAB — HEPATITIS B SURFACE ANTIGEN: Hepatitis B Surface Ag: NONREACTIVE

## 2021-08-27 LAB — MAGNESIUM: Magnesium: 2 mg/dL (ref 1.7–2.4)

## 2021-08-27 LAB — LACTIC ACID, PLASMA
Lactic Acid, Venous: 1.7 mmol/L (ref 0.5–1.9)
Lactic Acid, Venous: 1.2 mmol/L (ref 0.5–1.9)

## 2021-08-27 LAB — MRSA NEXT GEN BY PCR, NASAL: MRSA by PCR Next Gen: NOT DETECTED

## 2021-08-27 LAB — PHOSPHORUS: Phosphorus: 7.9 mg/dL — ABNORMAL HIGH (ref 2.5–4.6)

## 2021-08-27 LAB — PROCALCITONIN: Procalcitonin: 5.19 ng/mL

## 2021-08-27 MED ORDER — DELFLEX-LC/1.5% DEXTROSE 344 MOSM/L IP SOLN: INTRAPERITONEAL | Status: DC

## 2021-08-27 MED ORDER — ONDANSETRON HCL 4 MG/2ML IJ SOLN
4.0000 mg | Freq: Four times a day (QID) | INTRAMUSCULAR | Status: DC | PRN
  Administered 2021-08-27 – 2021-09-03 (×4): 4 mg via INTRAVENOUS
  Filled 2021-08-27 (×5): qty 2

## 2021-08-27 MED ORDER — HYDROMORPHONE HCL 2 MG PO TABS
2.0000 mg | ORAL_TABLET | ORAL | Status: DC | PRN
Start: 2021-08-27 — End: 2021-08-31
  Administered 2021-08-27 – 2021-08-30 (×3): 2 mg via ORAL
  Filled 2021-08-27 (×4): qty 1

## 2021-08-27 MED ORDER — HYDROMORPHONE HCL 1 MG/ML IJ SOLN
0.5000 mg | INTRAMUSCULAR | Status: DC | PRN
  Administered 2021-08-27 – 2021-08-28 (×4): 0.5 mg via INTRAVENOUS
  Filled 2021-08-27 (×4): qty 0.5

## 2021-08-27 MED ORDER — ACETAMINOPHEN 325 MG PO TABS: 650.0000 mg | ORAL_TABLET | Freq: Four times a day (QID) | ORAL | Status: DC | PRN

## 2021-08-27 MED ORDER — HEPARIN SODIUM (PORCINE) 5000 UNIT/ML IJ SOLN
5000.0000 [IU] | Freq: Three times a day (TID) | INTRAMUSCULAR | Status: DC
  Administered 2021-08-27 – 2021-09-07 (×33): 5000 [IU] via SUBCUTANEOUS
  Filled 2021-08-27 (×33): qty 1

## 2021-08-27 MED ORDER — ACETAMINOPHEN 650 MG RE SUPP
650.0000 mg | Freq: Four times a day (QID) | RECTAL | Status: DC | PRN
  Administered 2021-09-01 – 2021-09-02 (×3): 650 mg via RECTAL
  Filled 2021-08-27 (×3): qty 1

## 2021-08-27 MED ORDER — ONDANSETRON HCL 4 MG PO TABS: 4.0000 mg | ORAL_TABLET | Freq: Four times a day (QID) | ORAL | Status: DC | PRN

## 2021-08-27 MED ORDER — PANTOPRAZOLE SODIUM 20 MG PO TBEC
20.0000 mg | DELAYED_RELEASE_TABLET | Freq: Every day | ORAL | Status: DC
  Administered 2021-08-27 – 2021-09-01 (×5): 20 mg via ORAL
  Filled 2021-08-27 (×7): qty 1

## 2021-08-27 MED ORDER — OXYCODONE HCL 5 MG PO TABS
5.0000 mg | ORAL_TABLET | ORAL | Status: DC | PRN
  Administered 2021-08-27 (×2): 5 mg via ORAL
  Filled 2021-08-27 (×2): qty 1

## 2021-08-27 MED ORDER — FAMOTIDINE 20 MG PO TABS
20.0000 mg | ORAL_TABLET | Freq: Every day | ORAL | Status: DC
  Administered 2021-08-27 – 2021-08-30 (×5): 20 mg via ORAL
  Filled 2021-08-27 (×5): qty 1

## 2021-08-27 MED ORDER — GENTAMICIN SULFATE 0.1 % EX CREA
1.0000 "application " | TOPICAL_CREAM | Freq: Every day | CUTANEOUS | Status: DC
  Administered 2021-08-29 – 2021-09-02 (×4): 1 via TOPICAL
  Filled 2021-08-27: qty 15

## 2021-08-27 MED ORDER — SEVELAMER CARBONATE 2.4 G PO PACK
2.4000 g | PACK | Freq: Three times a day (TID) | ORAL | Status: DC
  Administered 2021-08-27 – 2021-09-07 (×15): 2.4 g via ORAL
  Filled 2021-08-27 (×35): qty 1

## 2021-08-27 MED ORDER — HYDRALAZINE HCL 50 MG PO TABS
50.0000 mg | ORAL_TABLET | Freq: Two times a day (BID) | ORAL | Status: DC
  Administered 2021-08-27: 50 mg via ORAL
  Filled 2021-08-27: qty 1

## 2021-08-27 MED ORDER — CARVEDILOL 12.5 MG PO TABS
12.5000 mg | ORAL_TABLET | Freq: Every day | ORAL | Status: DC
  Administered 2021-08-27 – 2021-09-01 (×5): 12.5 mg via ORAL
  Filled 2021-08-27 (×8): qty 1

## 2021-08-27 MED ORDER — PROSOURCE PLUS PO LIQD
30.0000 mL | Freq: Two times a day (BID) | ORAL | Status: DC
  Administered 2021-08-27 – 2021-09-07 (×16): 30 mL via ORAL
  Filled 2021-08-27 (×19): qty 30

## 2021-08-27 NOTE — Consult Note (Signed)
[facility] Renal Consultation Note    Indication for Consultation:  Management of ESRD/hemodialysis, anemia, hypertension/volume, and secondary hyperparathyroidism. PCP:  HPI: Felicia Williams is a 81 y.o. 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  Presented to [facility] on 2/4 with 2-day Hx of generalized abdominal pain which came on suddenly. + nausea, but no vomiting or diarrhea. No fever or chills. No CP or dyspnea. Felt similar to prior episode of peritonitis. She was afebrile. Labs with WBC 14.8, Hgb 9.7, LA 3- > 2.6, K 3.7, Alb 2.9. CXR clear. Abd CT without acute findings. She had Blood Cx drawn (NGTD) and was started on Vanc/Ceftriaxone, and then given Cefepime. She was transferred to [facility]. PD Fluid Cx/cell count/gram stain were ordered, but unable to be completed until this AM (after antibiotics) when she arrived at [facility], as [facility] does not have any PD supplies or capabilities. The PD fluid effluent collected this AM was VERY CLOUDY.   Today - she continues to have generalized abdominal pain but remains afebrile. Last BM yesterday. She is unclear her exact PD prescription, knows that she does 5 cycles overnight, no day dwell. Uses 2 different dextrose bags, unclear on fill volume. She does not add heparin. Her outpatient dialysis/HT unit is closed today (Sunday).  Past Medical History:  Diagnosis Date   Anemia in stage 4 chronic kidney disease ([place]) 08/18/2018   Depression    Essential hypertension, benign    GERD (gastroesophageal reflux disease)    Glomerulonephritis    Gout    Mixed hyperlipidemia    Renal insufficiency    Past Surgical History:  Procedure Laterality Date   APPENDECTOMY     CATARACT EXTRACTION     COLONOSCOPY N/A 12/28/2015   Procedure: COLONOSCOPY;  Surgeon: Rogene Houston, MD;  [facility];  Service: Endoscopy;  Laterality: N/A;  815   ESOPHAGOGASTRODUODENOSCOPY N/A  11/16/2020   Procedure: ESOPHAGOGASTRODUODENOSCOPY (EGD);  Surgeon: Rogene Houston, MD;  Location: [facility];  Service: Endoscopy;  Laterality: N/A;  1:15   IR FLUORO GUIDE CV LINE RIGHT  01/16/2019   IR REMOVAL TUN CV CATH W/O FL  07/01/2019   IR US GUIDE VASC ACCESS RIGHT  01/16/2019   POLYPECTOMY  11/16/2020   Procedure: POLYPECTOMY;  Surgeon: Rogene Houston, MD;  Location: [facility];  Service: Endoscopy;;  gastric   TUBAL LIGATION     Family History  Problem Relation Age of Onset   CAD Father    Heart attack Father    Diabetes Mellitus II Father    Hypertension Father    Lupus Brother    Social History:  reports that she has never smoked. She has never used smokeless tobacco. She reports that she does not drink alcohol and does not use drugs.  ROS: As per HPI otherwise negative.  Physical Exam: Vitals:   08/27/21 0423 08/27/21 0745 08/27/21 0808 08/27/21 1107  BP: (!) 122/50 (!) 132/59  (!) 120/53  Pulse: 88 87  86  Resp: 18 (!) 25  (!) 25  Temp: 99.9 F (37.7 C)   99.5 F (37.5 C)  TempSrc: Oral  Oral Oral  SpO2: 92% 95%  95%  Weight:      Height:         General: Well developed, well nourished, in no acute distress. Room air. Head: Normocephalic, atraumatic, sclera non-icteric, mucus membranes are moist. Neck: Supple without  lymphadenopathy/masses. JVD not elevated. Lungs: Clear bilaterally to auscultation without wheezes, rales, or rhonchi. Breathing is unlabored. Heart: RRR with normal S1, S2. No murmurs, rubs, or gallops appreciated. Abdomen: Diffusely tender to palpation with guarding/rebound tenderness, localized mainly to mid L abdomen. PD cath in mid R abdomen. Musculoskeletal:  Strength and tone appear normal for age. Lower extremities: No edema or ischemic changes, no open wounds. Neuro: Alert and oriented X 3. Moves all extremities spontaneously. Psych:  Responds to questions appropriately with a normal affect. Dialysis Access: PD cath, no backup  AVF/AVG  Allergies  Allergen Reactions   Sulfa Antibiotics Other (See Comments)    Shut pt's kidneys down   Prior to Admission medications   Medication Sig Start Date End Date Taking? Authorizing Provider  carvedilol (COREG) 12.5 MG tablet Take 12.5 mg by mouth daily. 03/01/15  Yes [provider]  losartan (COZAAR) 50 MG tablet Take 50 mg by mouth at bedtime.   Yes [provider]  multivitamin (RENA-VIT) TABS tablet Take 1 tablet by mouth at bedtime. 01/20/19  Yes Nita Sells, MD  calcium carbonate (TUMS - DOSED IN MG ELEMENTAL CALCIUM) 500 MG chewable tablet Chew 1 tablet (200 mg of elemental calcium total) by mouth 2 (two) times daily with a meal. Patient not taking: Reported on 08/27/2021 01/20/19   Nita Sells, MD  famotidine (PEPCID) 20 MG tablet Take 1 tablet (20 mg total) by mouth at bedtime. Patient not taking: Reported on 08/27/2021 11/14/20   Rogene Houston, MD  lansoprazole (PREVACID) 30 MG capsule Take 1 capsule (30 mg total) by mouth daily before breakfast. Patient not taking: Reported on 08/27/2021 01/24/21   Rogene Houston, MD   Current Facility-Administered Medications  Medication Dose Route Frequency Provider Last Rate Last Admin   acetaminophen (TYLENOL) tablet 650 mg  650 mg Oral Q6H PRN Zierle-Ghosh, Asia B, DO       Or   acetaminophen (TYLENOL) suppository 650 mg  650 mg Rectal Q6H PRN Zierle-Ghosh, Asia B, DO       carvedilol (COREG) tablet 12.5 mg  12.5 mg Oral Daily Zierle-Ghosh, Asia B, DO   12.5 mg at 08/27/21 1014   ceFEPIme (MAXIPIME) 1 g in sodium chloride 0.9 % 100 mL IVPB  1 g Intravenous Q24H Zierle-Ghosh, Asia B, DO 200 mL/hr at 08/27/21 0030 1 g at 08/27/21 0030   dialysis solution 1.5% low-MG/low-CA dianeal solution   Intraperitoneal Q24H Reesa Chew, MD       dialysis solution 2.5% low-MG/low-CA dianeal solution   Intraperitoneal Q24H Reesa Chew, MD       dialysis solution 4.25% low-MG/low-CA dianeal solution    Intraperitoneal Q24H Reesa Chew, MD       famotidine (PEPCID) tablet 20 mg  20 mg Oral QHS Zierle-Ghosh, Asia B, DO   20 mg at 08/27/21 0143   gentamicin cream (GARAMYCIN) 0.1 % 1 application  1 application Topical Daily Reesa Chew, MD       heparin injection 5,000 Units  5,000 Units Subcutaneous Q8H Zierle-Ghosh, Asia B, DO   5,000 Units at 08/27/21 0750   HYDROmorphone (DILAUDID) injection 0.5 mg  0.5 mg Intravenous Q3H PRN [name], MD       HYDROmorphone (DILAUDID) tablet 2 mg  2 mg Oral Q3H PRN [name], MD       ondansetron (ZOFRAN) tablet 4 mg  4 mg Oral Q6H PRN Zierle-Ghosh, Asia B, DO       Or  ondansetron (ZOFRAN) injection 4 mg  4 mg Intravenous Q6H PRN Zierle-Ghosh, Asia B, DO       pantoprazole (PROTONIX) EC tablet 20 mg  20 mg Oral Daily Zierle-Ghosh, Asia B, DO   20 mg at 08/27/21 1014   Labs: Basic Metabolic Panel: Recent Labs  Lab 08/26/21 1814 08/27/21 0120  NA 135 136  K 3.7 4.2  CL 95* 99  CO2 24 20*  GLUCOSE 114* 107*  BUN 73* 72*  CREATININE 10.43* 10.38*  CALCIUM 9.0 7.9*  PHOS  --  7.9*   Liver Function Tests: Recent Labs  Lab 08/26/21 1814 08/27/21 0120  AST 24 17  ALT 27 23  ALKPHOS 71 62  BILITOT 0.4 0.3  PROT 6.5 5.9*  ALBUMIN 2.9* 2.5*   Recent Labs  Lab 08/26/21 1814  LIPASE 66*   CBC: Recent Labs  Lab 08/26/21 1814 08/27/21 0120  WBC 14.8* 21.2*  NEUTROABS 13.7* 18.9*  HGB 9.7* 7.7*  HCT 27.4* 21.6*  MCV 103.4* 103.3*  PLT 242 224   Studies/Results: CT ABDOMEN PELVIS WO CONTRAST  Result Date: 08/26/2021 CLINICAL DATA:  Abdominal pain, acute, nonlocalized. End-stage renal disease on peritoneal dialysis. Remote history peritonitis. EXAM: CT ABDOMEN AND PELVIS WITHOUT CONTRAST TECHNIQUE: Multidetector CT imaging of the abdomen and pelvis was performed following the standard protocol without IV contrast. RADIATION DOSE REDUCTION: This exam was performed according to the departmental dose-optimization program  which includes automated exposure control, adjustment of the mA and/or kV according to patient size and/or use of iterative reconstruction technique. COMPARISON:  None. FINDINGS: Lower chest: Mild bibasilar pulmonary fibrotic change. 5 mm subpleural pulmonary nodule within the right middle lobe is indeterminate. Moderate multi-vessel coronary artery calcification. Cardiac size within normal limits. Small hiatal hernia. Hepatobiliary: No focal liver abnormality is seen. No gallstones, gallbladder wall thickening, or biliary dilatation. Pancreas: Unremarkable Spleen: Unremarkable Adrenals/Urinary Tract: The adrenal glands are unremarkable. The kidneys are atrophic bilaterally in keeping with given history of chronic renal failure. No contour deforming renal masses identified. Simple cortical cyst noted within the upper pole of the left kidney. No hydronephrosis. No intrarenal or ureteral masses. The bladder is decompressed. Stomach/Bowel: Moderate ascites, nonspecific in the setting of peritoneal dialysis. Right lower quadrant peritoneal dialysis catheter seen coiled within the left anterior hemipelvis. The stomach, small bowel, and large bowel are unremarkable. Appendix absent. No free intraperitoneal gas. Vascular/Lymphatic: Extensive aortoiliac atherosclerotic calcification. No aortic aneurysm. Prominent atherosclerotic calcification at the origin of the superior mesenteric artery are most certainly results in hemodynamically significant stenosis, though this is not well assessed on this non arteriographic study. No pathologic adenopathy within the abdomen and pelvis. Reproductive: Uterus and bilateral adnexa are unremarkable. Other: Tiny bilateral fat containing inguinal hernias. Rectum unremarkable. Musculoskeletal: Degenerative changes are seen within the a lumbar spine. No acute bone abnormality. No lytic or blastic bone lesion. IMPRESSION: No acute intra-abdominal pathology identified. No definite radiographic  explanation for the patient's reported symptoms. 5 mm right solid pulmonary nodule. No routine follow-up imaging is recommended per Fleischner Society Guidelines. These guidelines do not apply to immunocompromised patients and patients with cancer. Follow up in patients with significant comorbidities as clinically warranted. For lung cancer screening, adhere to Lung-RADS guidelines. Reference: Radiology. 2017; 284(1):228-43. Moderate multi-vessel coronary artery calcification. Marked renal atrophy in keeping with chronic renal failure. Findings in keeping with peritoneal dialysis noted. Peripheral vascular disease. If there is clinical evidence of chronic mesenteric ischemia, CT arteriography may be more helpful for further evaluation. Aortic  Atherosclerosis (ICD10-I70.0). Electronically Signed   By: Fidela Salisbury M.D.   On: 08/26/2021 19:28   DG Chest Portable 1 View  Result Date: 08/26/2021 CLINICAL DATA:  Cough EXAM: PORTABLE CHEST 1 VIEW COMPARISON:  01/16/2021 FINDINGS: The cardiomediastinal silhouette is unremarkable. Mild chronic peribronchial thickening and elevation of the RIGHT hemidiaphragm again noted. There is no evidence of focal airspace disease, pulmonary edema, suspicious pulmonary nodule/mass, pleural effusion, or pneumothorax. No acute bony abnormalities are identified. IMPRESSION: No evidence of acute cardiopulmonary disease. Electronically Signed   By: Margarette Canada M.D.   On: 08/26/2021 19:11    Dialysis Orders:  CCPD, coordinated through [facility] (Dr. Holley Raring) She is not 100% sure on her PD orders and he outpatient unit is closed today (Sunday) -> verify tomorrow. For tonight: 5 exchanges, 2L fill volume, 1.5% fluid, no additives.  Assessment/Plan:  PD associated peritonitis: Blood Cx negative. Started on Vanc/Cefepime prior to able to collect PD fluid sample. Sample collected this morning which was very cloudy -> cell count/gram stain/fluid Cx pending. Will consult pharmacy for  Vanc dosing -> assuming will be IP dosing in future.   ESRD:  Resume CCPD tonight. She is very tender - unclear if will be able to tolerate full volume. Try for 5 exchanges, 2L, 1.5% and see how she does.  Hypertension/volume: BP controlled, no edema on exam.  Anemia: Hgb 9.7 -> 7.7 overnight, no visible blood loss. Will verify last ESA dose with her unit tomorrow and can dose if needed.  Metabolic bone disease: CorrCa ok, Phos high - tells me currently not taking a binder. Cannot swallow pills and was given Rx Phoslyra and too expensive. Try sevelamer powder while here - see if tolerates, likely more affordable.  Nutrition:  Alb low, adding supplements.  Veneta Penton, PA-C 08/27/2021, 11:21 AM  [employer]

## 2021-08-27 NOTE — ED Notes (Signed)
Maxipime pulled from ED pyxis and given to Rich, RN to be started in route if vanc infusion finishes in route to [facility]

## 2021-08-27 NOTE — TOC Progression Note (Signed)
Transition of Care [facility]) - Progression Note    Patient Details  Name: Haizley Cannella MRN: [id number] Date of Birth: 08-Aug-1940  Transition of Care [facility]) CM/SW Contact  Zenon Mayo, RN Phone Number: 08/27/2021, 8:34 AM  Clinical Narrative:     Transition of Care [facility]) Screening Note   Patient Details  Name: Ashlea Dusing Date of Birth: 1940-10-06   Transition of Care [facility]) CM/SW Contact:    Zenon Mayo, RN Phone Number: 08/27/2021, 8:34 AM    Transition of Care Department [facility]) has reviewed patient and no TOC needs have been identified at this time. We will continue to monitor patient advancement through interdisciplinary progression rounds. If new patient transition needs arise, please place a TOC consult.          Expected Discharge Plan and Services                                                 Social Determinants of Health (SDOH) Interventions    Readmission Risk Interventions Readmission Risk Prevention Plan 01/20/2019  Transportation Screening Complete  PCP or Specialist Appt within 3-5 Days Complete  HRI or [place] Complete  Social Work Consult for [place] Planning/Counseling Complete  Palliative Care Screening Not Applicable  Medication Review [employer]) Complete  Some recent data might be hidden

## 2021-08-27 NOTE — Hospital Course (Addendum)
Sheetal Lyall is an 81 y.o. female with a history of ESRD on PD, HTN, HLD, spinal stenosis, and GERD who presented to the ED at [facility] 2/4 with severe abdominal pain, nausea and dry heaving. She was found to have leukocytosis, lactic acid elevation. CT abd/pelvis revealed no acute intraabdominal findings. IV antibiotics were started for suspicion of bacterial peritonitis and the patient was admitted to [facility]. Peritoneal fluid appeared milky with neutrophil count 8,667 and GPC on gram stain.  Peritoneal fluid cultures remain negative to date.  Treating for PD associated peritonitis.  Acute metabolic encephalopathy, suspected due to cefepime and peritonitis, cefepime discontinued.  Nephrology on board.  AMS worse 2/9, CT head without acute abnormalities, neurology consulted, having myoclonic jerks, LTM EEG suggestive of toxic metabolic encephalopathy.   All cephalosporins discontinued due to some risk of neurotoxicity.  Meropenem initiated 2/13 and she will complete 2 weeks of total antibiotic course on 2/17.  His vancomycin levels remained within therapeutic range. Encephalopathy resolved.  She is discharged to [facility] for intensive rehabilitation.  She will continue peritoneal dialysis at [facility].

## 2021-08-27 NOTE — Progress Notes (Signed)
Pt was just started on Losartan instead of Hydralazine just recently from her kidney MD, Lateff.  Thanks Arvella Nigh RN.

## 2021-08-27 NOTE — Significant Event (Signed)
Patient became dizzy and weak walking to bathroom sat on BSC mid way. Took 2 persons to assist back to bed. Then pt started vomitingin trash can.Gave Zofran for vomiting and Dilaudid for pain.

## 2021-08-27 NOTE — Progress Notes (Signed)
°  Progress Note  Patient: Felicia Williams WPY:[id number] DOB: Mar 14, 1941  DOA: 08/26/2021  DOS: 08/27/2021    Brief hospital course: Felicia Williams is an 81 y.o. female with a history of ESRD on PD, HTN, HLD, spinal stenosis, and GERD who presented to the ED at [facility] 2/4 with severe abdominal pain, nausea and dry heaving. She was found to have leukocytosis, lactic acid elevation. CT abd/pelvis revealed no acute intraabdominal findings. IV antibiotics were started for suspicion of bacterial peritonitis and the patient was admitted to [facility].  Assessment and Plan: * Spontaneous bacterial peritonitis ([place])- (present on admission) Peritoneal dialysis-associated bacterial peritonitis - Continue broad antibiotic coverage pending blood culture (NGTD from 2/4) data and peritoneal fluid culture (sent 2/5). - Augment pain control and monitor serial exams closely  Sepsis ([place])- (present on admission) Tachycardia, leukocytosis, lactic acidosis with a suspected source of SBP. WBC worse, hemodynamically stabilizing.  Gastroesophageal reflux disease- (present on admission) Continue PPI  End stage renal disease ([place])- (present on admission) Nephrology consulted for routine PD.  Essential hypertension, benign- (present on admission) Continue coreg, hold hydralazine for now with softer BP  Subjective: Pain in abdomen is severe, excruciating when softly palpated, only minimally better with pain meds. No sedation noted, and pt would like higher dose of pain medication. No nausea or vomiting currently, wants to have a diet. No diarrhea, constipation.  Objective: Vitals:   08/27/21 0200 08/27/21 0423 08/27/21 0745 08/27/21 0808  BP:  (!) 122/50 (!) 132/59   Pulse:  88 87   Resp:  18 (!) 25   Temp:  99.9 F (37.7 C)    TempSrc:  Oral  Oral  SpO2:  92% 95%   Weight: 57.7 kg     Height: 4\' 11"  (1.499 m)      Gen: 81 y.o. female in no distress, appearing well for stated age Pulm: Nonlabored breathing room  air.  CV: Regular rate and rhythm. No murmur, rub, or gallop. No JVD, no dependent edema. GI: Abdomen mildly distended and very tender to palpation.   Ext: Warm, no deformities Skin: No new rashes, lesions or ulcers on visualized skin. No jaundice, peritoneal catheter site without exudate/odor or significant erythema. Neuro: Alert and oriented. No focal neurological deficits. Psych: Judgement and insight appear normal. Mood euthymic & affect congruent. Behavior is appropriate  Data Personally reviewed: WBC 14 > 21 Hgb 9.7 > 7.7 Plt wnl K wnl, CO2 24 > 20 Phos 7.9 LFTs wnl, lipase 66, albumin 2.9 > 2.5.    CT ABDOMEN PELVIS WO CONTRAST 08/26/2021 - No acute intra-abdominal pathology identified. No definite radiographic explanation for the patient's reported symptoms.  - 5 mm right solid pulmonary nodule (no follow up recommended)  - Multi-vessel coronary and peripheral arterial calcifications.  - Marked renal atrophy in keeping with chronic renal failure. Findings in keeping with peritoneal dialysis noted.   CXR 1v 08/26/2021 No evidence of acute cardiopulmonary disease.    SARS-CoV-2 PCR (2/4): Negative Influenza A/B: Negative  Family Communication: None at bedside  Disposition: Status is: Inpatient Remains inpatient appropriate because: Peritonitis requiring IV antibiotics and diagnostic work up Planned Discharge Destination: Home  [name], MD 08/27/2021 10:43 AM Page by [email or web address]

## 2021-08-27 NOTE — Significant Event (Signed)
Pt desating to 883-90. Applying 2 L N/C 98%

## 2021-08-28 ENCOUNTER — Inpatient Hospital Stay (HOSPITAL_COMMUNITY): Payer: Medicare Other

## 2021-08-28 ENCOUNTER — Other Ambulatory Visit (HOSPITAL_COMMUNITY): Payer: Self-pay

## 2021-08-28 ENCOUNTER — Encounter (HOSPITAL_COMMUNITY): Payer: Self-pay

## 2021-08-28 ENCOUNTER — Ambulatory Visit (HOSPITAL_COMMUNITY): Payer: Medicare Other

## 2021-08-28 ENCOUNTER — Encounter (HOSPITAL_COMMUNITY): Payer: Self-pay | Admitting: Internal Medicine

## 2021-08-28 DIAGNOSIS — L299 Pruritus, unspecified: Secondary | ICD-10-CM | POA: Diagnosis present

## 2021-08-28 LAB — CBC
HCT: 22.3 % — ABNORMAL LOW (ref 36.0–46.0)
Hemoglobin: 7.8 g/dL — ABNORMAL LOW (ref 12.0–15.0)
MCH: 36.1 pg — ABNORMAL HIGH (ref 26.0–34.0)
MCHC: 35 g/dL (ref 30.0–36.0)
MCV: 103.2 fL — ABNORMAL HIGH (ref 80.0–100.0)
Platelets: 208 10*3/uL (ref 150–400)
RBC: 2.16 MIL/uL — ABNORMAL LOW (ref 3.87–5.11)
RDW: 13.2 % (ref 11.5–15.5)
WBC: 10.5 10*3/uL (ref 4.0–10.5)
nRBC: 0 % (ref 0.0–0.2)

## 2021-08-28 LAB — RENAL FUNCTION PANEL
Albumin: 2.2 g/dL — ABNORMAL LOW (ref 3.5–5.0)
Anion gap: 18 — ABNORMAL HIGH (ref 5–15)
BUN: 66 mg/dL — ABNORMAL HIGH (ref 8–23)
CO2: 22 mmol/L (ref 22–32)
Calcium: 8.1 mg/dL — ABNORMAL LOW (ref 8.9–10.3)
Chloride: 93 mmol/L — ABNORMAL LOW (ref 98–111)
Creatinine, Ser: 9.78 mg/dL — ABNORMAL HIGH (ref 0.44–1.00)
GFR, Estimated: 4 mL/min — ABNORMAL LOW (ref >60)
Glucose, Bld: 146 mg/dL — ABNORMAL HIGH (ref 70–99)
Phosphorus: 7.3 mg/dL — ABNORMAL HIGH (ref 2.5–4.6)
Potassium: 3.3 mmol/L — ABNORMAL LOW (ref 3.5–5.1)
Sodium: 133 mmol/L — ABNORMAL LOW (ref 135–145)

## 2021-08-28 LAB — HEPATITIS B SURFACE ANTIBODY, QUANTITATIVE: Hep B S AB Quant (Post): <3.1 m[IU]/mL — ABNORMAL LOW (ref >9.9)

## 2021-08-28 LAB — VANCOMYCIN, RANDOM: Vancomycin Rm: 22

## 2021-08-28 LAB — PATHOLOGIST SMEAR REVIEW

## 2021-08-28 MED ORDER — POTASSIUM CHLORIDE CRYS ER 20 MEQ PO TBCR
20.0000 meq | EXTENDED_RELEASE_TABLET | Freq: Once | ORAL | Status: AC
  Administered 2021-08-28: 20 meq via ORAL
  Filled 2021-08-28: qty 1

## 2021-08-28 MED ORDER — SODIUM CHLORIDE 0.9 % IV SOLN: INTRAVENOUS | Status: DC | PRN

## 2021-08-28 MED ORDER — DELFLEX-LC/1.5% DEXTROSE 344 MOSM/L IP SOLN
INTRAPERITONEAL | Status: DC
  Administered 2021-08-28: 5000 mL via INTRAPERITONEAL

## 2021-08-28 MED ORDER — CAMPHOR-MENTHOL 0.5-0.5 % EX LOTN
TOPICAL_LOTION | CUTANEOUS | Status: DC | PRN
  Filled 2021-08-28: qty 222

## 2021-08-28 MED ORDER — ENSURE ENLIVE PO LIQD
237.0000 mL | Freq: Two times a day (BID) | ORAL | Status: DC
  Administered 2021-08-28 – 2021-09-07 (×7): 237 mL via ORAL

## 2021-08-28 MED ORDER — GENTAMICIN SULFATE 0.1 % EX CREA
1.0000 "application " | TOPICAL_CREAM | Freq: Every day | CUTANEOUS | Status: DC
  Administered 2021-08-28 – 2021-09-07 (×8): 1 via TOPICAL
  Filled 2021-08-28: qty 15

## 2021-08-28 MED ORDER — RENA-VITE PO TABS
1.0000 | ORAL_TABLET | Freq: Every day | ORAL | Status: DC
  Administered 2021-08-28 – 2021-09-06 (×6): 1 via ORAL
  Filled 2021-08-28 (×7): qty 1

## 2021-08-28 NOTE — Evaluation (Signed)
Physical Therapy Evaluation Patient Details Name: Felicia Williams MRN: [id number] DOB: 1941-01-21 Today's Date: 08/28/2021  History of Present Illness  Pt is a 80 y.o. F who presents 08/26/2021 with severe abdominal pain, nausea, dry heaving. Found to have leukocytosis and lactic acid elevation. CT abd/pelvis revealed no acute intraabdominal findings. IV antibiotics started for suspicion of bacterial peritonitis. Significant PMH: ESRD on PD, HTN, HLD, spinal stenosis.  Clinical Impression  PTA, pt lives with her granddaughter and is independent. Pt reports continued nausea/vomiting and abdominal soreness. Pt presents with decreased fine motor coordination and impaired standing balance. Ambulating x 40 feet with a walker at a min guard assist level. Suspect good progress and do not anticipate need for PT follow up. Will continue to follow acutely to progress mobility as tolerated.     Recommendations for follow up therapy are one component of a multi-disciplinary discharge planning process, led by the attending physician.  Recommendations may be updated based on patient status, additional functional criteria and insurance authorization.  Follow Up Recommendations No PT follow up    Assistance Recommended at Discharge PRN  Patient can return home with the following  A little help with walking and/or transfers;A little help with bathing/dressing/bathroom;Help with stairs or ramp for entrance    Equipment Recommendations BSC/3in1  Recommendations for Other Services       Functional Status Assessment Patient has had a recent decline in their functional status and demonstrates the ability to make significant improvements in function in a reasonable and predictable amount of time.     Precautions / Restrictions Precautions Precautions: Fall Restrictions Weight Bearing Restrictions: No      Mobility  Bed Mobility Overal bed mobility: Modified Independent                   Transfers Overall transfer level: Needs assistance Equipment used: Rolling walker (2 wheels), None Transfers: Sit to/from Stand Sit to Stand: Supervision, Min guard           General transfer comment: Pt able to complete sit > stand without physical assist, min guard to steady with no AD    Ambulation/Gait Ambulation/Gait assistance: Min guard, Min assist Gait Distance (Feet): 40 Feet Assistive device: Rolling walker (2 wheels), None Gait Pattern/deviations: Step-through pattern, Decreased stride length, Narrow base of support Gait velocity: decreased Gait velocity interpretation: <1.8 ft/sec, indicate of risk for recurrent falls   General Gait Details: Pt initially taking steps in place with no AD, with left lateral LOB. With RW, much improved balance, ambulating x 40 ft with step through pattern, utilizing a narrow BOS  Stairs            Wheelchair Mobility    Modified Rankin (Stroke Patients Only)       Balance Overall balance assessment: Needs assistance Sitting-balance support: Feet supported Sitting balance-Leahy Scale: Good     Standing balance support: No upper extremity supported, During functional activity Standing balance-Leahy Scale: Poor                               Pertinent Vitals/Pain Pain Assessment Pain Assessment: Faces Faces Pain Scale: Hurts a little bit Pain Location: abdomen Pain Descriptors / Indicators: Sore Pain Intervention(s): Monitored during session    Home Living Family/patient expects to be discharged to:: Private residence Living Arrangements: Other relatives (granddaughter) Available Help at Discharge: Family Type of Home: House Home Access: Stairs to enter   [employer] of  Steps: 1   Home Layout: Laundry or work area in [place]: Kasandra Knudsen - single Barista (2 wheels);Wheelchair - manual      Prior Function Prior Level of Function : Independent/Modified  Independent;Driving                     Hand Dominance        Extremity/Trunk Assessment   Upper Extremity Assessment Upper Extremity Assessment: RUE deficits/detail RUE Coordination: decreased fine motor    Lower Extremity Assessment Lower Extremity Assessment: RLE deficits/detail;LLE deficits/detail RLE Deficits / Details: Strength 5/5 LLE Deficits / Details: Strength 5/5    Cervical / Trunk Assessment Cervical / Trunk Assessment: Normal  Communication   Communication: No difficulties  Cognition Arousal/Alertness: Awake/alert Behavior During Therapy: WFL for tasks assessed/performed Overall Cognitive Status: Within Functional Limits for tasks assessed                                          General Comments      Exercises     Assessment/Plan    PT Assessment Patient needs continued PT services  PT Problem List Decreased strength;Decreased activity tolerance;Decreased balance;Decreased mobility       PT Treatment Interventions DME instruction;Gait training;Stair training;Functional mobility training;Therapeutic activities;Therapeutic exercise;Balance training;Patient/family education    PT Goals (Current goals can be found in the Care Plan section)  Acute Rehab PT Goals Patient Stated Goal: less nausea, to eat PT Goal Formulation: With patient/family Time For Goal Achievement: 09/11/21 Potential to Achieve Goals: Good    Frequency Min 3X/week     Co-evaluation               AM-PAC PT "6 Clicks" Mobility  Outcome Measure Help needed turning from your back to your side while in a flat bed without using bedrails?: None Help needed moving from lying on your back to sitting on the side of a flat bed without using bedrails?: None Help needed moving to and from a bed to a chair (including a wheelchair)?: A Little Help needed standing up from a chair using your arms (e.g., wheelchair or bedside chair)?: A Little Help needed to  walk in hospital room?: A Little Help needed climbing 3-5 steps with a railing? : A Little 6 Click Score: 20    End of Session Equipment Utilized During Treatment: Gait belt Activity Tolerance: Patient tolerated treatment well Patient left: in bed;with call bell/phone within reach;with family/visitor present Nurse Communication: Mobility status PT Visit Diagnosis: Unsteadiness on feet (R26.81);Difficulty in walking, not elsewhere classified (R26.2)    Time: 8850-2774 PT Time Calculation (min) (ACUTE ONLY): 28 min   Charges:   PT Evaluation $PT Eval Low Complexity: 1 Low PT Treatments $Therapeutic Activity: 8-22 mins        Wyona Almas, PT, DPT Acute Rehabilitation Services Pager [phone] Office [phone]   Deno Etienne 08/28/2021, 3:39 PM

## 2021-08-28 NOTE — Plan of Care (Signed)
  Problem: Education: Goal: Knowledge of General Education information will improve Description: Including pain rating scale, medication(s)/side effects and non-pharmacologic comfort measures Outcome: Progressing   Problem: Health Behavior/Discharge Planning: Goal: Ability to manage health-related needs will improve Outcome: Progressing   Problem: Activity: Goal: Risk for activity intolerance will decrease Outcome: Progressing   

## 2021-08-28 NOTE — Progress Notes (Signed)
Initial Nutrition Assessment  DOCUMENTATION CODES:   Not applicable  INTERVENTION:   Recommend liberalizing pt diet to regular due to ongoing poor appetite and poor PO intake. MD messaged. Encourage good PO intake.  Renal Multivitamin w/ minerals daily Ensure Enlive po BID, each supplement provides 350 kcal and 20 grams of protein. Continue 30 ml ProSource Plus BID, each supplement provides 100 kcals and 15 grams protein.   NUTRITION DIAGNOSIS:   Increased nutrient needs related to acute illness as evidenced by estimated needs.  GOAL:   Patient will meet greater than or equal to 90% of their needs  MONITOR:   PO intake, Supplement acceptance, Labs, Weight trends, I & O's  REASON FOR ASSESSMENT:   Consult Assessment of nutrition requirement/status  ASSESSMENT:   81 y.o. female presented to the ED with complaints of peritonitis. PMH includes ESRD on PD, HTN, gastroparesis, and GERD. Pt admitted with spontaneous bacterial peritonitis w/ sepsis.   Pt resting in bed, family at bedside.  Pt reports that she has had nausea an vomiting for the past two days. Reports that her appetite has been poor for several months and that she never is hungry. She states that she will look at food and does not even want it. Pt reports a typical intake throughout the day of; cereal, half a small orange, a few cookies, and 1 Ensure.  Per EMR, pt intake includes 10% on 2/05.  Pt reports that her UBW was 120# prior to starting dialysis and now she stays around 125#. Pt reports that she is unsure if she has lost any weight due to being on dialysis and retaining fluid around her abdomen. Pt reports that she has notice complete loss of muscle in her arms and legs. Per EMR, pt has not had any weight loss within the past year.  Pt unsure if she has received the ProSource yet, agreeable to keep due to needs. Discussed adding Ensure, pt agreeable.   CCPD  Pt currently receives 5 - 2 L exchanges per night  of 1.5%, which provides 200-260 kcal/night.  Medications reviewed and include: Pepcid, Protonix, Renvela, IV antibiotics  Labs reviewed:  - Sodium 133 - Potassium 3.3 - Phosphorus 7.3   NUTRITION - FOCUSED PHYSICAL EXAM:  Flowsheet Row Most Recent Value  Orbital Region No depletion  Upper Arm Region No depletion  Thoracic and Lumbar Region No depletion  Buccal Region No depletion  Temple Region No depletion  Clavicle Bone Region Mild depletion  Clavicle and Acromion Bone Region Mild depletion  Scapular Bone Region Mild depletion  Dorsal Hand Severe depletion  Patellar Region Severe depletion  Anterior Thigh Region Severe depletion  Posterior Calf Region Severe depletion  Edema (RD Assessment) None  Hair Reviewed  Eyes Reviewed  Mouth Reviewed  Skin Reviewed  Nails Reviewed       Diet Order:   Diet Order             Diet renal with fluid restriction Fluid restriction: 2000 mL Fluid; Room service appropriate? Yes; Fluid consistency: Thin  Diet effective now                   EDUCATION NEEDS:   No education needs have been identified at this time  Skin:  Skin Assessment: Reviewed RN Assessment  Last BM:  No Documentation  Height:   Ht Readings from Last 1 Encounters:  08/27/21 4\' 11"  (1.499 m)    Weight:   Wt Readings from Last 1 Encounters:  08/28/21 58.3 kg    BMI:  Body mass index is 25.96 kg/m.  Estimated Nutritional Needs:   Kcal:  1800-2000  Protein:  90-105 grams  Fluid:  1.8-2 L    Aubrina Nieman Louie Casa, RD, LDN Clinical Dietitian See [facility] for contact information.

## 2021-08-28 NOTE — TOC Benefit Eligibility Note (Signed)
Patient Teacher, English as a foreign language completed.    The patient is currently admitted and upon discharge could be taking sevelamer carbonate (Renvela) 2.4 g Pack.  The current 30 day co-pay is, $244.25.   The patient is insured through [place], [place] Patient Advocate Specialist [place] Patient Advocate Team Direct Number: [phone]  Fax: [phone]

## 2021-08-28 NOTE — Progress Notes (Addendum)
[facility] Progress Note  Name: Felicia Williams MRN: [id number] DOB: [birth year]  Chief Complaint:  Abdominal pain   Subjective:  vanc level early am 22.  PD cell count never sent.  Gram stain with GPC.  Feels better today but did have nausea this am.  Uses mostly 1.5% at home.  She was able to tolerate PD last night.  Still on PD now - no UF available yet.   Review of systems:  Denies shortness of breath  No chest pain  Nausea with vomiting   --------- Background on consult:  Felicia Williams is a 81 y.o. female with ESRD on CCPD x 4 years, HL, HTN, and GERD who was admitted as transfer from [facility] with peritonitis.   Presented to [facility] on 2/4 with 2-day Hx of generalized abdominal pain which came on suddenly. + nausea, but no vomiting or diarrhea. No fever or chills. No CP or dyspnea. Felt similar to prior episode of peritonitis. She was afebrile. Labs with WBC 14.8, Hgb 9.7, LA 3- > 2.6, K 3.7, Alb 2.9. CXR clear. Abd CT without acute findings. She had Blood Cx drawn (NGTD) and was started on Vanc/Ceftriaxone, and then given Cefepime. She was transferred to [facility]. PD Fluid Cx/cell count/gram stain were ordered, but unable to be completed until this AM (after antibiotics) when she arrived at [facility], as [facility] does not have any PD supplies or capabilities. The PD fluid effluent collected this AM was VERY CLOUDY.  Today - she continues to have generalized abdominal pain but remains afebrile. Last BM yesterday. She is unclear her exact PD prescription, knows that she does 5 cycles overnight, no day dwell. Uses 2 different dextrose bags, unclear on fill volume. She does not add heparin.   Intake/Output Summary (Last 24 hours) at 08/28/2021 0747 Last data filed at 08/28/2021 0600 Gross per 24 hour  Intake 360 ml  Output --  Net 360 ml    Vitals:  Vitals:   08/27/21 1914 08/28/21 0012 08/28/21 0431 08/28/21 0725  BP: (!) 112/52 (!) 109/50 (!) 120/49 105/69  Pulse:   74 75  Resp: 20 14 16   (!) 21  Temp: 98.7 F (37.1 C) 97.9 F (36.6 C) 98.8 F (37.1 C) 98 F (36.7 C)  TempSrc: Oral Oral Oral Oral  SpO2: 94% 98% 96% 97%  Weight:   58.3 kg   Height:         Physical Exam:  General adult female in bed in no acute distress HEENT normocephalic atraumatic extraocular movements intact sclera anicteric Neck supple trachea midline Lungs clear to auscultation bilaterally normal work of breathing at rest room air Heart S1S2 no rub Abdomen soft nontender nondistended Extremities no edema  Psych normal mood and affect Neuro alert and oriented x 3 provides hx and follows commands  Medications reviewed   Labs:  BMP Latest Ref Rng & Units 08/27/2021 08/26/2021 01/16/2021  Glucose 70 - 99 mg/dL 107(H) 114(H) 116(H)  BUN 8 - 23 mg/dL 72(H) 73(H) 58(H)  Creatinine 0.44 - 1.00 mg/dL 10.38(H) 10.43(H) 10.78(H)  Sodium 135 - 145 mmol/L 136 135 135  Potassium 3.5 - 5.1 mmol/L 4.2 3.7 3.9  Chloride 98 - 111 mmol/L 99 95(L) 92(L)  CO2 22 - 32 mmol/L 20(L) 24 30  Calcium 8.9 - 10.3 mg/dL 7.9(L) 9.0 10.0   CCPD, coordinated through [facility] (Dr. Holley Raring)  Assessment/Plan:    PD associated peritonitis: Blood Cx negative. Started on Vanc/Cefepime prior to able to collect PD fluid sample.  Sample collected this morning which was very cloudy -> gram stain done and fluid Cx pending.  pharmacy was consulted for Vanc dosing. No cell count was collected - ordered again for today. Await am labs  ESRD:  CCPD again tonight.  All 1.5%.  Will contact outpatient HT unit for rx    Hypertension/volume: controlled  Anemia: Hgb 9.7 -> 7.7.  Will verify last ESA dose with her unit and will dose if needed.  Metabolic bone disease: hyperphos and states currently not taking a binder. Cannot swallow pills and was given Rx Phoslyra and too expensive. We are trying sevelamer powder while here - see if tolerates, likely more affordable.  Nutrition:  Alb low, note added supplements.    Claudia Desanctis,  MD 08/28/2021 7:47 AM   Called [facility] for outpatient records.  Spoke with admin. [phone] is the clinic number; PD RN may be out of the office tomorrow  PD 57 kg EDW Last ESA was dosed at home per the HD unit. Mircera is ordered for 50 mcg on 08/10/21. Will speak with patient again tomorrow to confirm this was her most recent dose as she does at home.  May be able to give again here soon.   Claudia Desanctis, MD 5:32 PM 08/28/2021

## 2021-08-28 NOTE — Progress Notes (Signed)
Progress Note  Patient: Felicia Williams MGN:[id number] DOB: 11-07-1940  DOA: 08/26/2021  DOS: 08/28/2021    Brief hospital course: Felicia Williams is an 81 y.o. female with a history of ESRD on PD, HTN, HLD, spinal stenosis, and GERD who presented to the ED at [facility] 2/4 with severe abdominal pain, nausea and dry heaving. She was found to have leukocytosis, lactic acid elevation. CT abd/pelvis revealed no acute intraabdominal findings. IV antibiotics were started for suspicion of bacterial peritonitis and the patient was admitted to [facility]. Peritoneal fluid appeared milky with neutrophil count 8,667 and GPC on gram stain.  Assessment and Plan: * Spontaneous bacterial peritonitis ([place])- (present on admission) Peritoneal dialysis-associated bacterial peritonitis - Continue broad antibiotic coverage pending blood culture (NGTD from 2/4) peritoneal fluid culture (sent 2/5). Gram stain +GPC and milky peritoneal fluid clearly infected by cell count. - Continue pain control.  Pruritus- (present on admission) Moisturizing lotion ordered. TBili wnl, BUN not severely/acutely elevated.   Protein-calorie malnutrition, moderate ([place])- (present on admission) Supplement protein as able. Hypoalbuminemia noted at 2.2.   Sepsis ([place])- (present on admission) Tachycardia, leukocytosis, lactic acidosis with a suspected source of SBP. WBC has normalized on broad antibiotics, hemodynamically stabilizing.  Gastroesophageal reflux disease- (present on admission) Continue PPI  End stage renal disease ([place])- (present on admission) Nephrology consulted for routine PD.  N&V (nausea and vomiting)- (present on admission) Due to SBP.  - Antiemetic prn  Essential hypertension, benign- (present on admission) Continue coreg, continue to hold hydralazine  Subjective: Abdominal pain improved, though got severely abruptly nauseated with recurrent emesis overnight. No fevers. Has chronic bodywide itching without rash that is  currently severe.   Objective: Vitals:   08/27/21 1914 08/28/21 0012 08/28/21 0431 08/28/21 0725  BP: (!) 112/52 (!) 109/50 (!) 120/49 105/69  Pulse:   74 75  Resp: 20 14 16  (!) 21  Temp: 98.7 F (37.1 C) 97.9 F (36.6 C) 98.8 F (37.1 C) 98 F (36.7 C)  TempSrc: Oral Oral Oral Oral  SpO2: 94% 98% 96% 97%  Weight:   58.3 kg   Height:       Gen: 81 y.o. female in no distress Pulm: Nonlabored breathing room air. Clear. CV: Regular rate and rhythm. No murmur, rub, or gallop. No JVD, no dependent edema. GI: Abdomen mildly distended with much less tenderness than prior, no rebound or guarding, +BS.  Ext: Warm, no deformities Skin: No rashes, lesions or ulcers on visualized skin. Neuro: Alert and oriented. No focal neurological deficits. Psych: Judgement and insight appear fair. Mood euthymic & affect congruent. Behavior is appropriate.    Data Personally reviewed: WBC 14 > 21 > 10.5 Hgb 9.7 > 7.7 > 7.8 Plt wnl K wnl, CO2 24 > 20 Phos 7.9 LFTs wnl, lipase 66, albumin 2.9 > 2.5.   Cortisol 12.1  Peritoneal fluid: Milky, 8,667 PMNs, GPC on gram stain, culture pending.   CT ABDOMEN PELVIS WO CONTRAST 08/26/2021 - No acute intra-abdominal pathology identified. No definite radiographic explanation for the patient's reported symptoms.  - 5 mm right solid pulmonary nodule (no follow up recommended)  - Multi-vessel coronary and peripheral arterial calcifications.  - Marked renal atrophy in keeping with chronic renal failure. Findings in keeping with peritoneal dialysis noted.   CXR 1v 08/26/2021 No evidence of acute cardiopulmonary disease.    SARS-CoV-2 PCR (2/4): Negative Influenza A/B: Negative  Family Communication: None at bedside  Disposition: Status is: Inpatient Remains inpatient appropriate because: Peritonitis requiring IV antibiotics and diagnostic work  up Planned Discharge Destination: Home  [name], MD 08/28/2021 12:44 PM Page by [email or web address]

## 2021-08-28 NOTE — Progress Notes (Addendum)
Pt had sudden, severe episode of shortness of breath while resting in bed. Pt has not been eating much overall, but had recently had a sip of Ensure. Pt had only had 2 bites of chicken and the Ensure for dinner. Pt temporarily placed on 4L nasal cannula, later decreased to 2L. Rhonchi auscultated in lungs. Pt stated relief after a few minutes on nasal cannula. MD paged.

## 2021-08-28 NOTE — Assessment & Plan Note (Addendum)
Resolved

## 2021-08-28 NOTE — Assessment & Plan Note (Addendum)
-  Moisturizing lotion ordered. TBili wnl, BUN not severely/acutely elevated.

## 2021-08-28 NOTE — Assessment & Plan Note (Deleted)
Nutrition Status: Nutrition Problem: Increased nutrient needs Etiology: acute illness Signs/Symptoms: estimated needs Interventions: MVI, Ensure Enlive (each supplement provides 350kcal and 20 grams of protein), Prostat, Liberalize Diet

## 2021-08-29 ENCOUNTER — Other Ambulatory Visit (HOSPITAL_COMMUNITY): Payer: Self-pay

## 2021-08-29 DIAGNOSIS — R0609 Other forms of dyspnea: Secondary | ICD-10-CM

## 2021-08-29 DIAGNOSIS — R0602 Shortness of breath: Secondary | ICD-10-CM

## 2021-08-29 LAB — URINALYSIS, ROUTINE W REFLEX MICROSCOPIC
Bilirubin Urine: NEGATIVE
Glucose, UA: >=500 mg/dL — AB
Hgb urine dipstick: NEGATIVE
Ketones, ur: NEGATIVE mg/dL
Leukocytes,Ua: NEGATIVE
Nitrite: NEGATIVE
Protein, ur: 30 mg/dL — AB
Specific Gravity, Urine: 1.005 (ref 1.005–1.030)
pH: 8 (ref 5.0–8.0)

## 2021-08-29 LAB — HEPATITIS B DNA, ULTRAQUANTITATIVE, PCR
HBV DNA SERPL PCR-ACNC: NOT DETECTED IU/mL
HBV DNA SERPL PCR-LOG IU: UNDETERMINED log10 IU/mL

## 2021-08-29 LAB — RENAL FUNCTION PANEL
Albumin: 2.4 g/dL — ABNORMAL LOW (ref 3.5–5.0)
Anion gap: 15 (ref 5–15)
BUN: 69 mg/dL — ABNORMAL HIGH (ref 8–23)
CO2: 23 mmol/L (ref 22–32)
Calcium: 8.3 mg/dL — ABNORMAL LOW (ref 8.9–10.3)
Chloride: 95 mmol/L — ABNORMAL LOW (ref 98–111)
Creatinine, Ser: 9.51 mg/dL — ABNORMAL HIGH (ref 0.44–1.00)
GFR, Estimated: 4 mL/min — ABNORMAL LOW (ref >60)
Glucose, Bld: 121 mg/dL — ABNORMAL HIGH (ref 70–99)
Phosphorus: 6.6 mg/dL — ABNORMAL HIGH (ref 2.5–4.6)
Potassium: 3.5 mmol/L (ref 3.5–5.1)
Sodium: 133 mmol/L — ABNORMAL LOW (ref 135–145)

## 2021-08-29 LAB — CBC
HCT: 20.4 % — ABNORMAL LOW (ref 36.0–46.0)
Hemoglobin: 7.3 g/dL — ABNORMAL LOW (ref 12.0–15.0)
MCH: 36.3 pg — ABNORMAL HIGH (ref 26.0–34.0)
MCHC: 35.8 g/dL (ref 30.0–36.0)
MCV: 101.5 fL — ABNORMAL HIGH (ref 80.0–100.0)
Platelets: 230 10*3/uL (ref 150–400)
RBC: 2.01 MIL/uL — ABNORMAL LOW (ref 3.87–5.11)
RDW: 12.8 % (ref 11.5–15.5)
WBC: 8.7 10*3/uL (ref 4.0–10.5)
nRBC: 0 % (ref 0.0–0.2)

## 2021-08-29 LAB — VANCOMYCIN, RANDOM: Vancomycin Rm: 20

## 2021-08-29 MED ORDER — VANCOMYCIN HCL IN DEXTROSE 1-5 GM/200ML-% IV SOLN
1000.0000 mg | Freq: Once | INTRAVENOUS | Status: AC
  Administered 2021-08-30: 1000 mg via INTRAVENOUS
  Filled 2021-08-29 (×2): qty 200

## 2021-08-29 MED ORDER — DELFLEX-LC/2.5% DEXTROSE 394 MOSM/L IP SOLN: INTRAPERITONEAL | Status: DC

## 2021-08-29 MED ORDER — DARBEPOETIN ALFA 100 MCG/0.5ML IJ SOSY
100.0000 ug | PREFILLED_SYRINGE | Freq: Once | INTRAMUSCULAR | Status: AC
  Administered 2021-08-29: 100 ug via SUBCUTANEOUS
  Filled 2021-08-29: qty 0.5

## 2021-08-29 MED ORDER — DELFLEX-LC/2.5% DEXTROSE 394 MOSM/L IP SOLN
INTRAPERITONEAL | Status: DC
  Administered 2021-09-06: 5000 mL via INTRAPERITONEAL

## 2021-08-29 NOTE — Assessment & Plan Note (Addendum)
Resolved

## 2021-08-29 NOTE — TOC Benefit Eligibility Note (Addendum)
Patient Teacher, English as a foreign language completed.    The patient is currently admitted and upon discharge could be taking sevelamer carbonate (Renvela) 800 mg tablet.  The current 30 day co-pay is, $46.50.   The patient is currently admitted and upon discharge could be taking lanthanum (Fosrenol) 500 mg chewable tablet  The current 30 day co-pay is, $269.61.   The patient is insured through [place], [place] Patient Advocate Specialist [place] Patient Advocate Team Direct Number: [phone]  Fax: [phone]

## 2021-08-29 NOTE — Plan of Care (Signed)
°  Problem: Education: Goal: Knowledge of General Education information will improve Description: Including pain rating scale, medication(s)/side effects and non-pharmacologic comfort measures Outcome: Progressing   Problem: Clinical Measurements: Goal: Respiratory complications will improve Outcome: Progressing   Problem: Elimination: Goal: Will not experience complications related to urinary retention Outcome: Progressing

## 2021-08-29 NOTE — Progress Notes (Signed)
Physical Therapy Treatment Patient Details Name: Felicia Williams MRN: [id number] DOB: 22-Mar-1941 Today's Date: 08/29/2021   History of Present Illness Pt is a 81 y.o. F who presents 08/26/2021 with severe abdominal pain, nausea, dry heaving. Found to have leukocytosis and lactic acid elevation. CT abd/pelvis revealed no acute intraabdominal findings. IV antibiotics started for suspicion of bacterial peritonitis. Significant PMH: ESRD on PD, HTN, HLD, spinal stenosis.    PT Comments    Pt was limited in mobility this date by abnormal signs/symptoms. Upon coming to sit, pt appeared to get dizzy looking down to her R. Initially, planned to test for BPPV, but then pt became lethargic and losing attention often. Pt began to have brief jerking movements, which worsened after standing, but she appeared to be conscious throughout. Pt also reporting hearing voices last night when she was dozing but would then open eyes and no one was there. Deferred further mobility for pt safety and notified RN and MD.   BP:  161/62 sitting 133/67 standing 150/67 supine end of session    Recommendations for follow up therapy are one component of a multi-disciplinary discharge planning process, led by the attending physician.  Recommendations may be updated based on patient status, additional functional criteria and insurance authorization.  Follow Up Recommendations  Home health PT (may progress to no PT)     Assistance Recommended at Discharge Intermittent Supervision/Assistance  Patient can return home with the following A little help with walking and/or transfers;A little help with bathing/dressing/bathroom;Help with stairs or ramp for entrance;Assistance with cooking/housework;Assist for transportation   Equipment Recommendations  BSC/3in1    Recommendations for Other Services       Precautions / Restrictions Precautions Precautions: Fall Precaution Comments: monitor BP, maybe orhtostatic but had some  jerky movements Restrictions Weight Bearing Restrictions: No     Mobility  Bed Mobility Overal bed mobility: Needs Assistance Bed Mobility: Supine to Sit, Sit to Supine     Supine to sit: Min guard, HOB elevated Sit to supine: Min guard, HOB elevated   General bed mobility comments: Min guard for safety due to pt's impaired cognition and jerky movements.    Transfers Overall transfer level: Needs assistance Equipment used: None, 1 person hand held assist Transfers: Sit to/from Stand Sit to Stand: Min guard, Min assist           General transfer comment: Pt coming to stand 1x without UE support, minor stagger, min guard. MinA to pull up on PT to maintain safety second rep.    Ambulation/Gait               General Gait Details: deferred due to jerky movements and possible orthostatic hypotension   Stairs             Wheelchair Mobility    Modified Rankin (Stroke Patients Only)       Balance Overall balance assessment: Needs assistance Sitting-balance support: Feet supported, Bilateral upper extremity supported, No upper extremity supported Sitting balance-Leahy Scale: Fair Sitting balance - Comments: Able to initially sit without UE support min guard-supervision, but pt became dizzy looking down to her R, was about to test BPPV when she began to jerk on occasion also. Pt using UEs on bed to maintain balance during jerky moments.   Standing balance support: During functional activity, No upper extremity supported, Bilateral upper extremity supported Standing balance-Leahy Scale: Poor Standing balance comment: ABle to stand without UE support, but jerky movements causing LOB, minA to maintain with UEs on  PT                            Cognition Arousal/Alertness: Awake/alert Behavior During Therapy: WFL for tasks assessed/performed Overall Cognitive Status: Impaired/Different from baseline Area of Impairment: Attention, Following commands,  Safety/judgement, Awareness, Problem solving                   Current Attention Level: Selective   Following Commands: Follows one step commands consistently, Follows one step commands with increased time, Follows multi-step commands inconsistently Safety/Judgement: Decreased awareness of deficits, Decreased awareness of safety Awareness: Emergent Problem Solving: Slow processing, Requires verbal cues General Comments: Pt awake and answering questions appropriately supine upon arrival. Once sitting up she became dizzy looking down at her R with moments of high guard when she would get dizzy. Pt getting lethargic standing/sitting, see General Comments below in regards to BP. Pt with much decreased attention span and slower processing when upright. Poor awareness of her deficits, unable to identify issues at times andbelieving she was good to proceed ot ambulate even though jerking and feeling "like I was about to go out". Pt reports seeing her sandwich "on fire" when she closes her eyes and reports hearing voices last night but no one there, denies the voices still occurring.        Exercises      General Comments General comments (skin integrity, edema, etc.): very dizzy looking down at her R side, initially thought she may have some BPPV, but she began to start to space out. Pt became dizzy/lightheaded on standing and had a little jerky movement so we sat back down then she had a very strong brief moment of jerking movements and said "I thought I was about to go out" immediately after the episode ended. Her BP sitting there then was 161/62, we stood again and BP was 133/67, and then once pt was supine with HOB elevated BP was 150/67. Pt reported that her [place] sand[place]iche "looks like it is on fire" when she closes her eyes and she heard voices last night like someone telling her their names but when she opened her eyes no one was there. She denies seeing anyone in  her room that is not truly  there. She is also denying the voices thing happening any more.      Pertinent Vitals/Pain Pain Assessment Pain Assessment: Faces Faces Pain Scale: Hurts a little bit Pain Location: abdomen Pain Descriptors / Indicators: Sore Pain Intervention(s): Limited activity within patient's tolerance, Monitored during session, Repositioned    Home Living                          Prior Function            PT Goals (current goals can now be found in the care plan section) Acute Rehab PT Goals Patient Stated Goal: to walk PT Goal Formulation: With patient Time For Goal Achievement: 09/11/21 Potential to Achieve Goals: Good Progress towards PT goals: Not progressing toward goals - comment (limited by abnormal signs today)    Frequency    Min 3X/week      PT Plan Discharge plan needs to be updated    Co-evaluation              AM-PAC PT "6 Clicks" Mobility   Outcome Measure  Help needed turning from your back to your side while in a flat bed without  using bedrails?: A Little Help needed moving from lying on your back to sitting on the side of a flat bed without using bedrails?: A Little Help needed moving to and from a bed to a chair (including a wheelchair)?: A Little Help needed standing up from a chair using your arms (e.g., wheelchair or bedside chair)?: A Little Help needed to walk in hospital room?: A Little Help needed climbing 3-5 steps with a railing? : A Little 6 Click Score: 18    End of Session   Activity Tolerance: Other (comment) (limited by jerky movements and possible orthostatic hypotension) Patient left: in bed;with call bell/phone within reach;with bed alarm set Nurse Communication: Mobility status;Other (comment) (BP, reporting hearing voices) PT Visit Diagnosis: Unsteadiness on feet (R26.81);Difficulty in walking, not elsewhere classified (R26.2);Other abnormalities of gait and mobility (R26.89);Dizziness and giddiness (R42)     Time:  6015-6153 PT Time Calculation (min) (ACUTE ONLY): 24 min  Charges:  $Therapeutic Activity: 23-37 mins                     Moishe Spice, PT, DPT Acute Rehabilitation Services  Pager: [phone] Office: [place] 08/29/2021, 2:55 PM

## 2021-08-29 NOTE — Progress Notes (Signed)
Pharmacy Antibiotic Note  Felicia Williams is a 81 y.o. female admitted on 08/26/2021 with bacteremial peritonitis.  Pharmacy has been consulted for Vancomycin / Cefepime dosing.  Vanc levels 22 and 20 2/6 and 2/7 respectively. Patient is tolerating PD. Per nephro, ok to continue IV while inpatient. Plan to transition to IP vancomycin on discharge. Patient received vancomycin 1250 mg x1 on 2/4. WBC down to wnl.   Plan: Give vancomycin 1000 mg on 2/8   Cefepime 1 gram IV Q 24 hours Monitor cultures, clinical status, PD tolerance, vancomycin level Narrow abx as able and to intraperitoneal at discharge  Planning 2 weeks total treatment    Height: 4\' 11"  (149.9 cm) Weight: 60 kg (132 lb 4.4 oz) IBW/kg (Calculated) : 43.2  Temp (24hrs), Avg:98.3 F (36.8 C), Min:98.1 F (36.7 C), Max:98.5 F (36.9 C)  Recent Labs  Lab 08/26/21 1814 08/26/21 1920 08/27/21 0120 08/27/21 0344 08/28/21 0142 08/28/21 0804 08/29/21 0353  WBC 14.8*  --  21.2*  --   --  10.5 8.7  CREATININE 10.43*  --  10.38*  --   --  9.78*  --   LATICACIDVEN 3.0* 2.6* 1.7 1.2  --   --   --   VANCORANDOM  --   --   --   --  22  --   --      Estimated Creatinine Clearance: 3.6 mL/min (A) (by C-G formula based on SCr of 9.78 mg/dL (H)).    Allergies  Allergen Reactions   Sulfa Antibiotics Other (See Comments)    Shut pt's kidneys down    Thank you for allowing pharmacy to be a part of this patients care.  Antimicrobials: Vanc 2/4>>  Cefe 2/5 >> CTX 2/4  Microbiology  2/4 BCx: ngtd 2/4 PD fluid: GPC 2/5 MRSA neg    Benetta Spar, PharmD, BCPS, BCCP Clinical Pharmacist  Please check AMION for all [place] phone numbers After 10:00 PM, call [place] [phone]

## 2021-08-29 NOTE — Progress Notes (Signed)
[facility] Progress Note  Name: Felicia Williams MRN: [id number] DOB: May 18, 1941  Chief Complaint:  Abdominal pain   Subjective:  Outputs not accurate as PD appears charted incorrectly.   Vanc level 20 this am. Spoke with outpatient PD RN and she had Peritonitis last year (patient states was 2 years ago).  Spoke with team - may be discharged tomorrow.  She states had a CXR for shortness of breath overnight - couldn't catch her breath.  She's ok with 2.5% dextrose. Yesterday am she was positive 380 mL.  No UF is charted for today.   Review of systems:   She reports shortness of breath  No chest pain  Denies n/v Abd pain better  --------- Background on consult:  Felicia Williams is a 81 y.o. female with ESRD on CCPD x 4 years, HL, HTN, and GERD who was admitted as transfer from [facility] with peritonitis.   Presented to [facility] on 2/4 with 2-day Hx of generalized abdominal pain which came on suddenly. + nausea, but no vomiting or diarrhea. No fever or chills. No CP or dyspnea. Felt similar to prior episode of peritonitis. She was afebrile. Labs with WBC 14.8, Hgb 9.7, LA 3- > 2.6, K 3.7, Alb 2.9. CXR clear. Abd CT without acute findings. She had Blood Cx drawn (NGTD) and was started on Vanc/Ceftriaxone, and then given Cefepime. She was transferred to [facility]. PD Fluid Cx/cell count/gram stain were ordered, but unable to be completed until this AM (after antibiotics) when she arrived at [facility], as [facility] does not have any PD supplies or capabilities. The PD fluid effluent collected this AM was VERY CLOUDY.  Today - she continues to have generalized abdominal pain but remains afebrile. Last BM yesterday. She is unclear her exact PD prescription, knows that she does 5 cycles overnight, no day dwell. Uses 2 different dextrose bags, unclear on fill volume. She does not add heparin.   Intake/Output Summary (Last 24 hours) at 08/29/2021 0822 Last data filed at 08/29/2021 0810 Gross per 24 hour  Intake 442.88  ml  Output 10009 ml  Net -9566.12 ml    Vitals:  Vitals:   08/28/21 1547 08/28/21 1924 08/28/21 2040 08/29/21 0419  BP: (!) 131/55 (!) 119/50 (!) 119/50 (!) 153/55  Pulse: 78 80 75   Resp: 20 (!) 22 20 19   Temp: 98.2 F (36.8 C) 98.4 F (36.9 C) 98.5 F (36.9 C) 98.1 F (36.7 C)  TempSrc: Oral Oral  Oral  SpO2: 95% 94% 95% 94%  Weight:   59 kg 60 kg  Height:         Physical Exam:   General adult female in bed in no acute distress HEENT normocephalic atraumatic extraocular movements intact sclera anicteric Neck supple trachea midline Lungs clear to auscultation bilaterally normal work of breathing at rest room air Heart S1S2 no rub Abdomen soft nontender nondistended Extremities no edema  Psych normal mood and affect Neuro alert and oriented x 3 provides hx and follows commands  Medications reviewed   Labs:  BMP Latest Ref Rng & Units 08/29/2021 08/28/2021 08/27/2021  Glucose 70 - 99 mg/dL 121(H) 146(H) 107(H)  BUN 8 - 23 mg/dL 69(H) 66(H) 72(H)  Creatinine 0.44 - 1.00 mg/dL 9.51(H) 9.78(H) 10.38(H)  Sodium 135 - 145 mmol/L 133(L) 133(L) 136  Potassium 3.5 - 5.1 mmol/L 3.5 3.3(L) 4.2  Chloride 98 - 111 mmol/L 95(L) 93(L) 99  CO2 22 - 32 mmol/L 23 22 20(L)  Calcium 8.9 - 10.3 mg/dL 8.3(L)  8.1(L) 7.9(L)   CCPD, coordinated through [facility] (Dr. Holley Raring) [phone]  57 kg EDW Last ESA was dosed at home per the HD unit. Mircera is ordered for 50 mcg on 08/10/21. May be able to give again here soon.  [phone] phone number for outpatient PD RN line   Assessment/Plan:   PD associated peritonitis: Blood Cx negative. Started on Vanc/Cefepime prior to able to collect PD fluid sample. Sample collected this morning which was very cloudy -> gram stain done and fluid Cx NGTD.  pharmacy was consulted for Vanc dosing. [phone] total nucleated cells on first sample sent Vanc level 20 today - pharmacy anticipates dose tomorrow On cefepime She is to present to her PD unit the day  after discharge to coordinate antibiotics for PD outpatient.   She started abx on 2/4 and will need two weeks of therapy with vanc and cefepime ESRD:  CCPD again tonight.  All 2.5%.  contacted outpatient unit   Hypertension/volume: controlled Anemia:  aranesp 100 mcg here today Metabolic bone disease: hyperphos and states currently not taking a binder. Cannot swallow pills and was given Rx Phoslyra and too expensive.  Sevelamer powder here but this is expensive as well.  Adjust regimen outpatient as needed per primary nephrologist Nutrition:  Alb low, note added supplements.   Disposition - per primary team; anticipate discharge on 2/8 per discussion with primary. [phone] phone number for outpatient PD RN line  Claudia Desanctis, MD 08/29/2021 8:56 AM

## 2021-08-29 NOTE — Progress Notes (Signed)
Progress Note  Patient: Felicia Williams EXB:[id number] DOB: [birth year]  DOA: 08/26/2021  DOS: 08/29/2021    Brief hospital course: Alesa Echevarria is an 81 y.o. female with a history of ESRD on PD, HTN, HLD, spinal stenosis, and GERD who presented to the ED at [facility] 2/4 with severe abdominal pain, nausea and dry heaving. She was found to have leukocytosis, lactic acid elevation. CT abd/pelvis revealed no acute intraabdominal findings. IV antibiotics were started for suspicion of bacterial peritonitis and the patient was admitted to [facility]. Peritoneal fluid appeared milky with neutrophil count 8,667 and GPC on gram stain.  Assessment and Plan: * Spontaneous bacterial peritonitis ([place])- (present on admission) Peritoneal dialysis-associated bacterial peritonitis - Continue broad antibiotic coverage pending blood culture (NGTD from 2/4) peritoneal fluid culture (sent 2/5). Gram stain +GPC and milky peritoneal fluid. Continuing vancomycin, cefepime thru IV for now, plan to administer IP vancomycin at discharge.  - Abd remains tender though WBC improved. If respiratory status and hemodynamics stable over next 24 hours, can DC home after checking for additional guidance from cultures. - Continue pain control.  Shortness of breath Negative acute CXR. Atelectasis likely contributing. No hypoxia or chest pain. At risk of reflux and on PPI. Will monitor for 24 hours.  Pruritus- (present on admission) Moisturizing lotion ordered. TBili wnl, BUN not severely/acutely elevated.   Protein-calorie malnutrition, moderate ([place])- (present on admission) Supplement protein as able. Hypoalbuminemia noted at 2.2.   Sepsis ([place])- (present on admission) Tachycardia, leukocytosis, lactic acidosis with a suspected source of SBP. WBC has normalized on broad antibiotics, hemodynamically stabilizing.  Gastroesophageal reflux disease- (present on admission) Continue PPI  End stage renal disease ([place])- (present on  admission) Nephrology consulted for routine PD.  N&V (nausea and vomiting)- (present on admission) Due to SBP.  - Antiemetic prn  Essential hypertension, benign- (present on admission) Continue coreg, continue to hold hydralazine  Subjective: Had reflux last night and an episode of abrupt onset shortness of breath without chest pain. Doesn't feel short of breath any more. Denies choking. Feels dizzy when standing per PT. Discussed with nephrology outside room.  Objective: Vitals:   08/28/21 1924 08/28/21 2040 08/29/21 0419 08/29/21 1139  BP: (!) 119/50 (!) 119/50 (!) 153/55 (!) 136/55  Pulse: 80 75  82  Resp: (!) 22 20 19 18   Temp: 98.4 F (36.9 C) 98.5 F (36.9 C) 98.1 F (36.7 C) 98.6 F (37 C)  TempSrc: Oral  Oral Oral  SpO2: 94% 95% 94% 95%  Weight:  59 kg 60 kg   Height:       Gen: Pleasant, well-appearing female in no distress Pulm: Nonlabored breathing room air. Clear. CV: Regular rate and rhythm. No  murmur, rub, or gallop. No JVD, no pitting dependent edema. GI: Abdomen soft, diffusely tender without rebound. +BS.  Ext: Warm, no deformities Skin: No new rashes, lesions or ulcers on visualized skin. Neuro: Alert and oriented. No focal neurological deficits. Psych: Judgement and insight appear fair. Mood euthymic & affect congruent. Behavior is appropriate.    Data Personally reviewed: WBC 14 > 21 > 10.5 Hgb 9.7 > 7.7 > 7.8 Plt wnl K wnl, CO2 24 > 20 Phos 7.9 LFTs wnl, lipase 66, albumin 2.9 > 2.5.   Cortisol 12.1 CXR 2/7: Streaky at bases consistent with atelectasis. No focal infiltrate.   Peritoneal fluid: Milky, 8,667 PMNs, GPC on gram stain, culture pending.   CT ABDOMEN PELVIS WO CONTRAST 08/26/2021 - No acute intra-abdominal pathology identified. No definite radiographic explanation for  the patient's reported symptoms.  - 5 mm right solid pulmonary nodule (no follow up recommended)  - Multi-vessel coronary and peripheral arterial calcifications.  -  Marked renal atrophy in keeping with chronic renal failure. Findings in keeping with peritoneal dialysis noted.   CXR 1v 08/26/2021 No evidence of acute cardiopulmonary disease.    SARS-CoV-2 PCR (2/4): Negative Influenza A/B: Negative  Family Communication: None at bedside  Disposition: Status is: Inpatient Remains inpatient appropriate because: Peritonitis requiring IV antibiotics and diagnostic work up Planned Discharge Destination: Home  [name], MD 08/29/2021 1:44 PM Page by [email or web address]

## 2021-08-30 DIAGNOSIS — T8571XD Infection and inflammatory reaction due to peritoneal dialysis catheter, subsequent encounter: Secondary | ICD-10-CM

## 2021-08-30 DIAGNOSIS — T8571XA Infection and inflammatory reaction due to peritoneal dialysis catheter, initial encounter: Secondary | ICD-10-CM | POA: Diagnosis present

## 2021-08-30 DIAGNOSIS — G9341 Metabolic encephalopathy: Secondary | ICD-10-CM

## 2021-08-30 DIAGNOSIS — E876 Hypokalemia: Secondary | ICD-10-CM | POA: Diagnosis not present

## 2021-08-30 LAB — RENAL FUNCTION PANEL
Albumin: 2 g/dL — ABNORMAL LOW (ref 3.5–5.0)
Anion gap: 16 — ABNORMAL HIGH (ref 5–15)
BUN: 62 mg/dL — ABNORMAL HIGH (ref 8–23)
CO2: 24 mmol/L (ref 22–32)
Calcium: 8.3 mg/dL — ABNORMAL LOW (ref 8.9–10.3)
Chloride: 93 mmol/L — ABNORMAL LOW (ref 98–111)
Creatinine, Ser: 9.29 mg/dL — ABNORMAL HIGH (ref 0.44–1.00)
GFR, Estimated: 4 mL/min — ABNORMAL LOW (ref >60)
Glucose, Bld: 157 mg/dL — ABNORMAL HIGH (ref 70–99)
Phosphorus: 5.8 mg/dL — ABNORMAL HIGH (ref 2.5–4.6)
Potassium: 2.9 mmol/L — ABNORMAL LOW (ref 3.5–5.1)
Sodium: 133 mmol/L — ABNORMAL LOW (ref 135–145)

## 2021-08-30 LAB — BODY FLUID CELL COUNT WITH DIFFERENTIAL
Eos, Fluid: 0 %
Lymphs, Fluid: 55 %
Monocyte-Macrophage-Serous Fluid: 27 % — ABNORMAL LOW (ref 50–90)
Neutrophil Count, Fluid: 18 % (ref 0–25)
Total Nucleated Cell Count, Fluid: 11 cu mm (ref 0–1000)

## 2021-08-30 LAB — CBC
HCT: 21.3 % — ABNORMAL LOW (ref 36.0–46.0)
Hemoglobin: 7.3 g/dL — ABNORMAL LOW (ref 12.0–15.0)
MCH: 35.1 pg — ABNORMAL HIGH (ref 26.0–34.0)
MCHC: 34.3 g/dL (ref 30.0–36.0)
MCV: 102.4 fL — ABNORMAL HIGH (ref 80.0–100.0)
Platelets: 240 10*3/uL (ref 150–400)
RBC: 2.08 MIL/uL — ABNORMAL LOW (ref 3.87–5.11)
RDW: 12.8 % (ref 11.5–15.5)
WBC: 6.9 10*3/uL (ref 4.0–10.5)
nRBC: 0 % (ref 0.0–0.2)

## 2021-08-30 MED ORDER — POTASSIUM CHLORIDE CRYS ER 20 MEQ PO TBCR
40.0000 meq | EXTENDED_RELEASE_TABLET | Freq: Once | ORAL | Status: AC
  Administered 2021-08-30: 40 meq via ORAL
  Filled 2021-08-30: qty 2

## 2021-08-30 MED ORDER — CEFAZOLIN SODIUM-DEXTROSE 2-4 GM/100ML-% IV SOLN
2.0000 g | Freq: Once | INTRAVENOUS | Status: AC
  Administered 2021-08-30: 2 g via INTRAVENOUS
  Filled 2021-08-30 (×2): qty 100

## 2021-08-30 NOTE — TOC Progression Note (Addendum)
Transition of Care [facility]) - Progression Note    Patient Details  Name: Felicia Williams MRN: [id number] Date of Birth: 10-13-1940  Transition of Care [facility]) CM/SW Contact  Zenon Mayo, RN Phone Number: 08/30/2021, 3:47 PM  Clinical Narrative:    Rozanna Boer daughter lives with patient,  she will have transport at discharge.  She has a cane at home and the dialysis equipment.  NCM offered choice for HHPT, she does not have a preference.  She is ok with [employer] supplying the bedside commode. NCM made referral to [facility] with [place] call back to see if she can take referral.   2/9- Levada Dy with [facility] states they can take patient as referral for [facility], [place].  Will need orders at discharge.          Expected Discharge Plan and Services                                                 Social Determinants of Health (SDOH) Interventions    Readmission Risk Interventions Readmission Risk Prevention Plan 01/20/2019  Transportation Screening Complete  PCP or Specialist Appt within 3-5 Days Complete  HRI or [place] Complete  Social Work Consult for [place] Planning/Counseling Complete  Palliative Care Screening Not Applicable  Medication Review [employer]) Complete  Some recent data might be hidden

## 2021-08-30 NOTE — Progress Notes (Incomplete)
Arrived to patient room to alarming pd machine, screen indicates drain lines blocked. Inspected lines they were not blocked. Contacted customer service due to unable to stat drain. Will use drain bag to remove fluid. Notified MD Royce Macadamia.

## 2021-08-30 NOTE — Progress Notes (Signed)
Physical Therapy Treatment Patient Details Name: Felicia Williams MRN: [id number] DOB: 1941/04/17 Today's Date: 08/30/2021   History of Present Illness Pt is a 81 y.o. F who presents 08/26/2021 with severe abdominal pain, nausea, dry heaving. Found to have leukocytosis and lactic acid elevation. CT abd/pelvis revealed no acute intraabdominal findings. IV antibiotics started for suspicion of bacterial peritonitis. Significant PMH: ESRD on PD, HTN, HLD, spinal stenosis.    PT Comments    Pt received in supine, lethargic and with increased confusion/tremors and BUE/BLE jerking movements. Pt not oriented to family member, location, situation or time and with poor command following and seated balance. Pt needing min to modA for long sitting in bed and unable to maintain >30 seconds due to lethargy/poor balance and constant UE/LE jerking movements, RN/MD notified. Unsafe to attempt EOB or transfers today. Pt son/granddaughter present and given instruction on pressure relief strategies, supine LE ROM/HEP and frequency of positional change for pressure relief. Pt with HOB elevated ~40 degrees at end of session as pt son helping her to drink nutritional supplement, encouraged him to keep HOB elevated ~30 mins after drinking for safety as pt is at increased risk of aspiration due to lethargy/AMS. Pt continues to benefit from PT services to progress toward functional mobility goals.    Recommendations for follow up therapy are one component of a multi-disciplinary discharge planning process, led by the attending physician.  Recommendations may be updated based on patient status, additional functional criteria and insurance authorization.  Follow Up Recommendations  Home health PT (may progress to no PT)     Assistance Recommended at Discharge Intermittent Supervision/Assistance  Patient can return home with the following A little help with walking and/or transfers;A little help with  bathing/dressing/bathroom;Help with stairs or ramp for entrance;Assistance with cooking/housework;Assist for transportation   Equipment Recommendations  BSC/3in1    Recommendations for Other Services       Precautions / Restrictions Precautions Precautions: Fall Precaution Comments: tremors/jerking movements, monitor BP Restrictions Weight Bearing Restrictions: No     Mobility  Bed Mobility Overal bed mobility: Needs Assistance Bed Mobility: Supine to Sit, Sit to Supine, Rolling Rolling: Min assist, Mod assist   Supine to sit: HOB elevated, Mod assist Sit to supine: HOB elevated, Mod assist   General bed mobility comments: pt needing modA to achieve upright from bed raised to ~40 deg and needing min/modA for long sit in bed ~20 seconds. Pt remains lethargic in supine/seated posture and unsafe to attempt edge of bed due to pt frequent tremors and poor command following. Pt unable to sit up long enough for seated BP assessment. TotalA +2 for posterior supine scooting to HOB.    Transfers                   General transfer comment: defer for safety, pt lethargic and frequent tremors with bed mobility    Ambulation/Gait                   Stairs             Wheelchair Mobility    Modified Rankin (Stroke Patients Only)       Balance Overall balance assessment: Needs assistance Sitting-balance support: Feet supported, Bilateral upper extremity supported, No upper extremity supported Sitting balance-Leahy Scale: Poor Sitting balance - Comments: pt needing min to modA for long sit in bed and unable to maintain >30 seconds, pt with frequent tremors and mostly keeping eyes closed.  Standing balance comment: unsafe to attempt today                            Cognition Arousal/Alertness: Lethargic Behavior During Therapy: Anxious, Flat affect, Restless (drowsy, confused) Overall Cognitive Status: Impaired/Different from  baseline Area of Impairment: Attention, Following commands, Safety/judgement, Awareness, Problem solving, Memory, Orientation                 Orientation Level: Disoriented to, Place, Time, Situation Current Attention Level: Focused Memory: Decreased recall of precautions, Decreased short-term memory Following Commands: Follows one step commands inconsistently Safety/Judgement: Decreased awareness of deficits, Decreased awareness of safety Awareness: Intellectual Problem Solving: Slow processing, Requires verbal cues, Decreased initiation, Difficulty sequencing, Requires tactile cues General Comments: Pt lethargic, with much decreased attention span and slower processing, unable to identify her granddaughter and frequently closing her eyes. Pt unable to follow instruction for gazing L/R to check for nystagmus and difficulty sequencing mobility tasks due to lethargy and frequent tremors. Pt unable to maintain upright posture in long sit and unsafe to attempt edge of bed tasks due to lethargy, AMS and frequent tremors.        Exercises Other Exercises Other Exercises: supine BLE AAROM: hip flexion, hip abduction, SAQ, ankle pumps x10 reps ea Other Exercises: supine BUE AAROM: elbow flex/ext, chest press x10 reps ea Other Exercises: pulling forward on bed side rails for seated midline posture, able to tolerate ~30 seconds with min/modA support; increased tremors so returned to spuine Other Exercises: rolling to L/R sides with cross-body reaching/pulling on bed rails x2 reps ea    General Comments General comments (skin integrity, edema, etc.): SpO2 97% on RA and HR 76 bpm with minimal bed-level mobility tasks; unable to assess seated BP due to limited time upright and nearly constant UE/LE twitching/tremors making BP readings likely inaccurate.      Pertinent Vitals/Pain Pain Assessment Pain Assessment: Faces Faces Pain Scale: Hurts a little bit Pain Location: generalized discomfort,  pt unable to localize Pain Descriptors / Indicators: Sore Pain Intervention(s): Limited activity within patient's tolerance, Monitored during session, Repositioned     PT Goals (current goals can now be found in the care plan section) Acute Rehab PT Goals Patient Stated Goal: to feel better, less tremors PT Goal Formulation: With patient Time For Goal Achievement: 09/11/21 Progress towards PT goals: Not progressing toward goals - comment (AMS/lethargy and tremors limiting)    Frequency    Min 3X/week      PT Plan Discharge plan needs to be updated       AM-PAC PT "6 Clicks" Mobility   Outcome Measure  Help needed turning from your back to your side while in a flat bed without using bedrails?: A Little Help needed moving from lying on your back to sitting on the side of a flat bed without using bedrails?: A Little Help needed moving to and from a bed to a chair (including a wheelchair)?: A Little Help needed standing up from a chair using your arms (e.g., wheelchair or bedside chair)?: A Little Help needed to walk in hospital room?: A Little Help needed climbing 3-5 steps with a railing? : A Little 6 Click Score: 18    End of Session   Activity Tolerance: Other (comment) (limited by jerky movements and possible orthostatic hypotension) Patient left: in bed;with call bell/phone within reach;with bed alarm set Nurse Communication: Mobility status;Other (comment) (BP, reporting hearing voices) PT Visit Diagnosis: Unsteadiness  on feet (R26.81);Difficulty in walking, not elsewhere classified (R26.2);Other abnormalities of gait and mobility (R26.89);Dizziness and giddiness (R42)     Time: 1898-4210 PT Time Calculation (min) (ACUTE ONLY): 21 min  Charges:  $Therapeutic Exercise: 8-22 mins                     Jeran Hiltz P., PTA Acute Rehabilitation Services Pager: [phone] Office: [place] 08/30/2021, 4:50 PM

## 2021-08-30 NOTE — Progress Notes (Signed)
Nutrition Follow-up  DOCUMENTATION CODES:   Not applicable  INTERVENTION:   Recommend liberalizing pt diet to regular due to ongoing poor appetite and poor PO intake. Received ok from MD. Encourage good PO intake.  Continue Renal Multivitamin w/ minerals daily Continue Ensure Enlive po BID, each supplement provides 350 kcal and 20 grams of protein. Continue 30 ml ProSource Plus BID, each supplement provides 100 kcals and 15 grams protein.   NUTRITION DIAGNOSIS:   Increased nutrient needs related to acute illness as evidenced by estimated needs. - Ongoing  GOAL:   Patient will meet greater than or equal to 90% of their needs - Ongoing  MONITOR:   PO intake, Supplement acceptance, Labs, Weight trends  REASON FOR ASSESSMENT:   Consult Assessment of nutrition requirement/status  ASSESSMENT:   81 y.o. female presented to the ED with complaints of peritonitis. PMH includes ESRD on PD, HTN, gastroparesis, and GERD. Pt admitted with spontaneous bacterial peritonitis w/ sepsis.   Pt resting in bed, family at bedside. Pt reports that she has been doing better and that she is no longer having any nausea or vomiting.  Per EMR, pt intake includes 100% breakfast and 0% dinner on 2/7.  Son at bedside reports that she has been eating better today. Margorie John reports that pt has been having a bit more confusion today.   Nepro at bedside and ProSource in medicine cup. Reached out to RN to know if pt prefers Nepro over Ensure.   Pt or family with no other questions or concerns at this time.   Medications reviewed and include: Pepcid, Protonix, Rena-Vit, Renvela, IV antibiotics  Labs reviewed:  - Sodium 133 - Potassium 2.9 - Phosphorus 5.8  Diet Order:   Diet Order             Diet renal with fluid restriction Fluid restriction: 2000 mL Fluid; Room service appropriate? Yes; Fluid consistency: Thin  Diet effective now                   EDUCATION NEEDS:   Not appropriate for  education at this time  Skin:  Skin Assessment: Reviewed RN Assessment  Last BM:  2/4  Height:   Ht Readings from Last 1 Encounters:  08/27/21 4\' 11"  (1.499 m)    Weight:   Wt Readings from Last 1 Encounters:  08/29/21 60 kg    BMI:  Body mass index is 26.72 kg/m.  Estimated Nutritional Needs:   Kcal:  1800-2000  Protein:  90-105 grams  Fluid:  1.8-2 L    Yer Olivencia Louie Casa, RD, LDN Clinical Dietitian See [facility] for contact information.

## 2021-08-30 NOTE — Assessment & Plan Note (Addendum)
She had abdominal pain with milky peritoneal fluid and GPC on Gram stain but fluid and blood culture negative. -Vanco and cefepime>> vanco and meropenem through 2/17 to complete 2 weeks course per ID.

## 2021-08-30 NOTE — Assessment & Plan Note (Addendum)
Recent Labs    11/16/20 1427 01/16/21 1408 08/26/21 1814 08/27/21 0120 08/28/21 0804 08/29/21 0353 08/30/21 0217 08/31/21 0108 09/01/21 0326 09/04/21 0413  HGB 11.6* 12.6 9.7* 7.7* 7.8* 7.3* 7.3* 8.5* 8.8* 8.9*  H&H stable. Anemia panel consistent with anemia of chronic disease.

## 2021-08-30 NOTE — Assessment & Plan Note (Addendum)
Felt to be neurotoxicity due to cephalosporin.  Other encephalopathy work-up including CT head, MRI brain, LTM EEG, ammonia, TSH, RPR and B12 unrevealing.  Encephalopathy resolved after stopping cephalosporin. -Reorientation and delirium precautions

## 2021-08-30 NOTE — Progress Notes (Signed)
[facility] Progress Note  Name: Felicia Williams MRN: [id number] DOB: 1941/02/08  Chief Complaint:  Abdominal pain   Subjective:  Outputs not accurate as PD appears charted incorrectly again.  No UF charted with PD on 2/7 but on machine it states 434 positive and for today's tx she is 1077 mL negative thus far - appears one dwell left. no UOP.   She has been confused with tremor.  Told her family she is seeing things.  She called her son and told him she was going to die today.    Review of systems:   limited with ams  She denies shortness of breath  No chest pain  Some nausea More uop recently  --------- Background on consult:  Felicia Williams is a 81 y.o. female with ESRD on CCPD x 4 years, HL, HTN, and GERD who was admitted as transfer from [facility] with peritonitis.   Presented to [facility] on 2/4 with 2-day Hx of generalized abdominal pain which came on suddenly. + nausea, but no vomiting or diarrhea. No fever or chills. No CP or dyspnea. Felt similar to prior episode of peritonitis. She was afebrile. Labs with WBC 14.8, Hgb 9.7, LA 3- > 2.6, K 3.7, Alb 2.9. CXR clear. Abd CT without acute findings. She had Blood Cx drawn (NGTD) and was started on Vanc/Ceftriaxone, and then given Cefepime. She was transferred to [facility]. PD Fluid Cx/cell count/gram stain were ordered, but unable to be completed until this AM (after antibiotics) when she arrived at [facility], as [facility] does not have any PD supplies or capabilities. The PD fluid effluent collected this AM was VERY CLOUDY.  Today - she continues to have generalized abdominal pain but remains afebrile. Last BM yesterday. She is unclear her exact PD prescription, knows that she does 5 cycles overnight, no day dwell. Uses 2 different dextrose bags, unclear on fill volume. She does not add heparin.   Intake/Output Summary (Last 24 hours) at 08/30/2021 0708 Last data filed at 08/30/2021 0441 Gross per 24 hour  Intake 10139 ml  Output 9485 ml  Net 654  ml    Vitals:  Vitals:   08/29/21 1139 08/29/21 1923 08/29/21 2000 08/30/21 0000  BP: (!) 136/55 (!) 158/68 (!) 158/68 (!) 159/64  Pulse: 82 89 89   Resp: 18 20 20 15   Temp: 98.6 F (37 C) 98.3 F (36.8 C) 98 F (36.7 C)   TempSrc: Oral Oral Oral   SpO2: 95% 95% 95%   Weight:   60 kg   Height:         Physical Exam:   General adult female in bed in no acute distress HEENT normocephalic atraumatic extraocular movements intact sclera anicteric Neck supple trachea midline Lungs clear to auscultation bilaterally normal work of breathing at rest  Heart S1S2 no rub Abdomen soft nontender nondistended Extremities no edema  Psych normal mood and affect Neuro alert and oriented x 3 provides hx and follows commands  Medications reviewed   Labs:  BMP Latest Ref Rng & Units 08/30/2021 08/29/2021 08/28/2021  Glucose 70 - 99 mg/dL 157(H) 121(H) 146(H)  BUN 8 - 23 mg/dL 62(H) 69(H) 66(H)  Creatinine 0.44 - 1.00 mg/dL 9.29(H) 9.51(H) 9.78(H)  Sodium 135 - 145 mmol/L 133(L) 133(L) 133(L)  Potassium 3.5 - 5.1 mmol/L 2.9(L) 3.5 3.3(L)  Chloride 98 - 111 mmol/L 93(L) 95(L) 93(L)  CO2 22 - 32 mmol/L 24 23 22   Calcium 8.9 - 10.3 mg/dL 8.3(L) 8.3(L) 8.1(L)   [facility], coordinated  through [facility] (Dr. Holley Raring) [phone]  57 kg EDW Last ESA was dosed at home per the HD unit. Mircera is ordered for 50 mcg on 08/10/21. May be able to give again here soon.  [phone] phone number for outpatient PD RN line   Assessment/Plan:   PD associated peritonitis: Blood Cx negative. Started on Vanc/Cefepime prior to able to collect PD fluid sample. Sample collected this morning which was very cloudy -> gram stain done and fluid Cx NGTD.  pharmacy was consulted for Vanc dosing. [phone] total nucleated cells on first sample sent Vanc to be dosed today - appreciate pharmacy Change cefepime to ancef given mental status changes She is to present to her PD unit the day after discharge to coordinate antibiotics  for PD outpatient.   She started abx on 2/4 and will need two weeks of therapy with vanc 1 gram MWF and ancef per outpatient protocol.  I have confirmed with her outpatient PD unit that she could get ancef Blood cx NGTD ESRD:  CCPD again tonight - here vs home.  All 2.5% if here Altered mental status - concerning for neurotoxicity from cefepime.  Changing to an alternate agent ancef.  Hypertension/volume: optimize volume with PD Anemia:  aranesp 100 mcg on 2/7. On mircera outpatient as above Metabolic bone disease: hyperphos and states currently not taking a binder. Cannot swallow pills and was given Rx Phoslyra and too expensive.  Has been on sevelamer powder here but this is expensive as well.  Adjust regimen outpatient as needed per primary nephrologist Hypokalemia - she has gotten a dose of potassium 40 meq once today.  May need a daily supplement. Would check labs with outpatient PD unit to guide   Disposition - would continue inpatient monitoring. I have sent a message to her team.  [phone] phone number for outpatient PD RN line  Claudia Desanctis, MD 08/30/2021 7:39 AM

## 2021-08-30 NOTE — Progress Notes (Signed)
PROGRESS NOTE   Jadah Bobak  HBZ:[id number]    DOB: March 05, 1941    DOA: 08/26/2021  PCP: [name], NP   I have briefly reviewed patients previous medical records in [facility].  Chief Complaint  Patient presents with   Abdominal Pain    Hospital Course:  Briyonna Omara is an 81 y.o. female with a history of ESRD on PD, HTN, HLD, spinal stenosis, and GERD who presented to the ED at [facility] 2/4 with severe abdominal pain, nausea and dry heaving. She was found to have leukocytosis, lactic acid elevation. CT abd/pelvis revealed no acute intraabdominal findings. IV antibiotics were started for suspicion of bacterial peritonitis and the patient was admitted to [facility]. Peritoneal fluid appeared milky with neutrophil count 8,667 and GPC on gram stain.  Peritoneal fluid cultures remain negative to date.  Treating for PD associated peritonitis.  Acute metabolic encephalopathy, suspected due to cefepime and peritonitis, cefepime discontinued.  Nephrology on board.   Assessment & Plan:  Principal Problem:   Peritonitis associated with peritoneal dialysis [facility]) Active Problems:   Acute metabolic encephalopathy   Hypokalemia   Macrocytic anemia   Essential hypertension, benign   End stage renal disease (HCC)   Gastroesophageal reflux disease   N&V (nausea and vomiting)   Sepsis (HCC)   Protein-calorie malnutrition, moderate (HCC)   Pruritus   Shortness of breath   Assessment and Plan: * Peritonitis associated with peritoneal dialysis ([place])- (present on admission) Sepsis due to peritonitis, POA. Peritoneal dialysis-associated bacterial peritonitis -Treated with broad antibiotic coverage pending blood culture (NGTD from 2/4) peritoneal fluid culture NGTD (sent 2/5). Gram stain +GPC and milky peritoneal fluid.  - Continuing vancomycin.  Cefepime discontinued due to concern for AMS then switched over to cefazolin.  As per nephrology, both of these can be given across dialysis. -Not  medically ready for DC. -Avoid opioids and sedative meds due to encephalopathy  Acute metabolic encephalopathy Suspected multifactorial due to cefepime, sepsis from peritonitis, hospital delirium. Delirium precautions.  Minimize sedatives and opioids. Cefepime discontinued. Continue to monitor closely.  Macrocytic anemia- (present on admission) Hemoglobin stable in the low 7 g range. Follow CBC closely and transfuse if hemoglobin 7 g or less.  Essential hypertension, benign- (present on admission) Continue coreg, continue to hold hydralazine Controlled.  End stage renal disease ([place])- (present on admission) Nephrology consulted for routine PD. Discussed with Dr. Royce Macadamia 2/8.  Gastroesophageal reflux disease- (present on admission) Continue PPI  Shortness of breath Negative acute CXR. Atelectasis likely contributing. No hypoxia or chest pain. At risk of reflux and on PPI.  No dyspnea reported and is not hypoxic.  Pruritus- (present on admission) Moisturizing lotion ordered. TBili wnl, BUN not severely/acutely elevated.   Protein-calorie malnutrition, moderate ([place])- (present on admission) Supplement protein as able. Hypoalbuminemia noted at 2.2.   Sepsis ([place])- (present on admission) Tachycardia, leukocytosis, lactic acidosis with a suspected source of peritonitis.  Management as noted above  N&V (nausea and vomiting)- (present on admission) Due to SBP.  - Antiemetic prn   Body mass index is 26.72 kg/m.  Nutritional Status Nutrition Problem: Increased nutrient needs Etiology: acute illness Signs/Symptoms: estimated needs Interventions: Ensure Enlive (each supplement provides 350kcal and 20 grams of protein), MVI, Prostat, Liberalize Diet  Pressure Ulcer:     DVT prophylaxis: heparin injection 5,000 Units Start: 08/27/21 0600     Code Status: Full Code:  Family Communication: Daughter at bedside Disposition:  Status is: Inpatient Remains inpatient appropriate  because: Severity of  illness     Consultants:   Nephrology  Procedures:   Peritoneal dialysis  Antimicrobials:   As noted above   Subjective:  Interviewed and examined patient along with daughter at bedside.  Daughter stayed with patient overnight.  Patient alert and oriented only to self, remains confused, unable to provide adequate history.  She keeps repeating her name.  As per daughter, patient has been progressively confused for the last 2 or 3 days.  She reports that patient has told her that she is seeing dead family members walked in the room and can hear their voices.  Appetite is poor.  Yesterday early afternoon, she was able to ambulate to the bathroom and urinated the most that she has in a long time.  However by yesterday evening, weak and unable to walk with PT and more confused.  Patient also expressed concerns to her daughter that she has "Parkinson's" due to hand tremors and "cancer".  Objective:   Vitals:   08/29/21 1923 08/29/21 2000 08/30/21 0000 08/30/21 0814  BP: (!) 158/68 (!) 158/68 (!) 159/64 (!) (P) 154/65  Pulse: 89 89  (!) (P) 2  Resp: 20 20 15  (P) 19  Temp: 98.3 F (36.8 C) 98 F (36.7 C)  (P) 98.3 F (36.8 C)  TempSrc: Oral Oral  (P) Oral  SpO2: 95% 95%    Weight:  60 kg    Height:        General exam: Elderly female, moderately built and nourished lying comfortably propped up in bed without distress. Respiratory system: Clear to auscultation. Respiratory effort normal. Cardiovascular system: S1 & S2 heard, RRR. No JVD, murmurs, rubs, gallops or clicks. No pedal edema.  Telemetry personally reviewed: Sinus rhythm. Gastrointestinal system: Abdomen is nondistended, soft.  Mild suprapubic/pericatheter tenderness without rigidity, rebound or guarding. No organomegaly or masses felt. Normal bowel sounds heard. Central nervous system: Alert and oriented only to self. No focal neurological deficits. Extremities: Symmetric 5 x 5 power. Skin: No rashes,  lesions or ulcers Psychiatry: Judgement and insight are impaired. Mood & affect flat.     Data Reviewed:   I have personally reviewed following labs and imaging studies   CBC: Recent Labs  Lab 08/26/21 1814 08/27/21 0120 08/28/21 0804 08/29/21 0353 08/30/21 0217  WBC 14.8* 21.2* 10.5 8.7 6.9  NEUTROABS 13.7* 18.9*  --   --   --   HGB 9.7* 7.7* 7.8* 7.3* 7.3*  HCT 27.4* 21.6* 22.3* 20.4* 21.3*  MCV 103.4* 103.3* 103.2* 101.5* 102.4*  PLT 242 224 208 230 623    Basic Metabolic Panel: Recent Labs  Lab 08/26/21 1814 08/27/21 0120 08/28/21 0804 08/29/21 0353 08/30/21 0217  NA 135 136 133* 133* 133*  K 3.7 4.2 3.3* 3.5 2.9*  CL 95* 99 93* 95* 93*  CO2 24 20* 22 23 24   GLUCOSE 114* 107* 146* 121* 157*  BUN 73* 72* 66* 69* 62*  CREATININE 10.43* 10.38* 9.78* 9.51* 9.29*  CALCIUM 9.0 7.9* 8.1* 8.3* 8.3*  MG  --  2.0  --   --   --   PHOS  --  7.9* 7.3* 6.6* 5.8*    Liver Function Tests: Recent Labs  Lab 08/26/21 1814 08/27/21 0120 08/28/21 0804 08/29/21 0353 08/30/21 0217  AST 24 17  --   --   --   ALT 27 23  --   --   --   ALKPHOS 71 62  --   --   --   BILITOT 0.4 0.3  --   --   --  PROT 6.5 5.9*  --   --   --   ALBUMIN 2.9* 2.5* 2.2* 2.4* 2.0*    CBG: Recent Labs  Lab 08/27/21 1058 08/27/21 1516  GLUCAP 95 97    Microbiology Studies:   Recent Results (from the past 240 hour(s))  Blood culture (routine x 2)     Status: None (Preliminary result)   Collection Time: 08/26/21  8:39 PM   Specimen: Left Antecubital; Blood  Result Value Ref Range Status   Specimen Description LEFT ANTECUBITAL  Final   Special Requests   Final    BOTTLES DRAWN AEROBIC AND ANAEROBIC Blood Culture results may not be optimal due to an excessive volume of blood received in culture bottles   Culture   Final    NO GROWTH 3 DAYS Performed at [facility], [address], [place], [place] [address]    Report Status PENDING  Incomplete  Blood culture (routine x 2)     Status:  None (Preliminary result)   Collection Time: 08/26/21  8:46 PM   Specimen: BLOOD LEFT HAND  Result Value Ref Range Status   Specimen Description BLOOD LEFT HAND  Final   Special Requests   Final    BOTTLES DRAWN AEROBIC AND ANAEROBIC Blood Culture results may not be optimal due to an excessive volume of blood received in culture bottles   Culture   Final    NO GROWTH 3 DAYS Performed at [facility], [address], [place], [place] [address]    Report Status PENDING  Incomplete  Gram stain     Status: None   Collection Time: 08/26/21  9:12 PM   Specimen: Peritoneal Washings  Result Value Ref Range Status   Specimen Description PERITONEAL FLUID  Final   Special Requests NONE  Final   Gram Stain   Final    WBC PRESENT, PREDOMINANTLY PMN GRAM POSITIVE COCCI CYTOSPIN SMEAR Performed at [facility], [place] [address], [place], [place] [address]    Report Status 08/27/2021 FINAL  Final  Culture, body fluid w Gram Stain-bottle     Status: None (Preliminary result)   Collection Time: 08/26/21  9:12 PM   Specimen: Peritoneal Washings  Result Value Ref Range Status   Specimen Description PERITONEAL FLUID  Final   Special Requests NONE  Final   Culture   Final    NO GROWTH 3 DAYS Performed at [facility] [address], [place], [place] [address]    Report Status PENDING  Incomplete  Resp Panel by RT-PCR (Flu A&B, Covid) Nasopharyngeal Swab     Status: None   Collection Time: 08/26/21  9:14 PM   Specimen: Nasopharyngeal Swab; Nasopharyngeal(NP) swabs in vial transport medium  Result Value Ref Range Status   SARS Coronavirus 2 by RT PCR NEGATIVE NEGATIVE Final    Comment: (NOTE) SARS-CoV-2 target nucleic acids are NOT DETECTED.  The SARS-CoV-2 RNA is generally detectable in upper respiratory specimens during the acute phase of infection. The lowest concentration of SARS-CoV-2 viral copies this assay can detect is 138 copies/mL. A negative result does not preclude  SARS-Cov-2 infection and should not be used as the sole basis for treatment or other patient management decisions. A negative result may occur with  improper specimen collection/handling, submission of specimen other than nasopharyngeal swab, presence of viral mutation(s) within the areas targeted by this assay, and inadequate number of viral copies(<138 copies/mL). A negative result must be combined with clinical observations, patient history, and epidemiological  information. The expected result is Negative.  Fact Sheet for Patients:  EntrepreneurPulse.com.au  Fact Sheet for Healthcare Providers:  IncredibleEmployment.be  This test is no t yet approved or cleared by the [place] FDA and  has been authorized for detection and/or diagnosis of SARS-CoV-2 by FDA under an Emergency Use Authorization (EUA). This EUA will remain  in effect (meaning this test can be used) for the duration of the COVID-19 declaration under Section 564(b)(1) of the Act, 21 U.S.C.section 360bbb-3(b)(1), unless the authorization is terminated  or revoked sooner.       Influenza A by PCR NEGATIVE NEGATIVE Final   Influenza B by PCR NEGATIVE NEGATIVE Final    Comment: (NOTE) The Xpert Xpress SARS-CoV-2/FLU/RSV plus assay is intended as an aid in the diagnosis of influenza from Nasopharyngeal swab specimens and should not be used as a sole basis for treatment. Nasal washings and aspirates are unacceptable for Xpert Xpress SARS-CoV-2/FLU/RSV testing.  Fact Sheet for Patients: EntrepreneurPulse.com.au  Fact Sheet for Healthcare Providers: IncredibleEmployment.be  This test is not yet approved or cleared by the [place] FDA and has been authorized for detection and/or diagnosis of SARS-CoV-2 by FDA under an Emergency Use Authorization (EUA). This EUA will remain in effect (meaning this test can be used) for the duration of  the COVID-19 declaration under Section 564(b)(1) of the Act, 21 U.S.C. section 360bbb-3(b)(1), unless the authorization is terminated or revoked.  Performed at [facility], [address] [place], [place] [address]   MRSA Next Gen by PCR, Nasal     Status: None   Collection Time: 08/27/21 10:37 AM   Specimen: Nasal Mucosa; Nasal Swab  Result Value Ref Range Status   MRSA by PCR Next Gen NOT DETECTED NOT DETECTED Final    Comment: (NOTE) The GeneXpert MRSA Assay (FDA approved for NASAL specimens only), is one component of a comprehensive MRSA colonization surveillance program. It is not intended to diagnose MRSA infection nor to guide or monitor treatment for MRSA infections. Test performance is not FDA approved in patients less than 68 years old. Performed at [facility], [place] [address] [place],  [address]     Radiology Studies:  DG CHEST PORT 1 VIEW  Result Date: 08/28/2021 CLINICAL DATA:  Shortness of breath EXAM: PORTABLE CHEST 1 VIEW COMPARISON:  08/26/2021 FINDINGS: Mild elevation of the right hemidiaphragm. Bibasilar opacities, favor atelectasis. Heart is normal size. No effusions. Aortic atherosclerosis. No acute bony abnormality. IMPRESSION: Bibasilar opacities, favor atelectasis. Electronically Signed   By: Rolm Baptise M.D.   On: 08/28/2021 21:39    Scheduled Meds:    (feeding supplement) PROSource Plus  30 mL Oral BID BM   carvedilol  12.5 mg Oral Daily   famotidine  20 mg Oral QHS   feeding supplement  237 mL Oral BID BM   gentamicin cream  1 application Topical Daily   gentamicin cream  1 application Topical Daily   heparin  5,000 Units Subcutaneous Q8H   multivitamin  1 tablet Oral QHS   pantoprazole  20 mg Oral Daily   sevelamer carbonate  2.4 g Oral TID WC    Continuous Infusions:    sodium chloride Stopped (08/28/21 2307)    ceFAZolin (ANCEF) IV     dialysis solution 2.5% low-MG/low-CA     vancomycin       LOS: 4 days     Vernell Leep, MD,  FACP, [facility], [facility], [facility] (Care Management Physician Certified) [place]  To contact the attending provider between 7A-7P or the covering provider during after hours 7P-7A, please log into the web site [email or web address] and access using universal [place] password for that web site. If you do not have the password, please call the hospital operator.  08/30/2021, 10:32 AM

## 2021-08-31 ENCOUNTER — Other Ambulatory Visit (HOSPITAL_COMMUNITY): Payer: Self-pay

## 2021-08-31 ENCOUNTER — Inpatient Hospital Stay (HOSPITAL_COMMUNITY): Payer: Medicare Other

## 2021-08-31 LAB — FOLATE: Folate: 45.6 ng/mL (ref >5.9)

## 2021-08-31 LAB — CBC
HCT: 24.2 % — ABNORMAL LOW (ref 36.0–46.0)
Hemoglobin: 8.5 g/dL — ABNORMAL LOW (ref 12.0–15.0)
MCH: 36.3 pg — ABNORMAL HIGH (ref 26.0–34.0)
MCHC: 35.1 g/dL (ref 30.0–36.0)
MCV: 103.4 fL — ABNORMAL HIGH (ref 80.0–100.0)
Platelets: 266 10*3/uL (ref 150–400)
RBC: 2.34 MIL/uL — ABNORMAL LOW (ref 3.87–5.11)
RDW: 12.9 % (ref 11.5–15.5)
WBC: 8.6 10*3/uL (ref 4.0–10.5)
nRBC: 0 % (ref 0.0–0.2)

## 2021-08-31 LAB — TSH: TSH: 0.865 u[IU]/mL (ref 0.350–4.500)

## 2021-08-31 LAB — RENAL FUNCTION PANEL
Albumin: 2 g/dL — ABNORMAL LOW (ref 3.5–5.0)
Anion gap: 15 (ref 5–15)
BUN: 66 mg/dL — ABNORMAL HIGH (ref 8–23)
CO2: 24 mmol/L (ref 22–32)
Calcium: 8.9 mg/dL (ref 8.9–10.3)
Chloride: 97 mmol/L — ABNORMAL LOW (ref 98–111)
Creatinine, Ser: 9.72 mg/dL — ABNORMAL HIGH (ref 0.44–1.00)
GFR, Estimated: 4 mL/min — ABNORMAL LOW (ref >60)
Glucose, Bld: 136 mg/dL — ABNORMAL HIGH (ref 70–99)
Phosphorus: 5.4 mg/dL — ABNORMAL HIGH (ref 2.5–4.6)
Potassium: 3.2 mmol/L — ABNORMAL LOW (ref 3.5–5.1)
Sodium: 136 mmol/L (ref 135–145)

## 2021-08-31 LAB — PATHOLOGIST SMEAR REVIEW

## 2021-08-31 LAB — CULTURE, BLOOD (ROUTINE X 2)
Culture: NO GROWTH
Culture: NO GROWTH

## 2021-08-31 LAB — RETICULOCYTES
Immature Retic Fract: 23.7 % — ABNORMAL HIGH (ref 2.3–15.9)
RBC.: 2.34 MIL/uL — ABNORMAL LOW (ref 3.87–5.11)
Retic Count, Absolute: 46.2 10*3/uL (ref 19.0–186.0)
Retic Ct Pct: 2 % (ref 0.4–3.1)

## 2021-08-31 LAB — IRON AND TIBC
Iron: 65 ug/dL (ref 28–170)
Saturation Ratios: 41 % — ABNORMAL HIGH (ref 10.4–31.8)
TIBC: 157 ug/dL — ABNORMAL LOW (ref 250–450)
UIBC: 92 ug/dL

## 2021-08-31 LAB — FERRITIN: Ferritin: 1857 ng/mL — ABNORMAL HIGH (ref 11–307)

## 2021-08-31 LAB — AMMONIA: Ammonia: 32 umol/L (ref 9–35)

## 2021-08-31 LAB — VITAMIN B12: Vitamin B-12: 827 pg/mL (ref 180–914)

## 2021-08-31 MED ORDER — CEFAZOLIN SODIUM-DEXTROSE 1-4 GM/50ML-% IV SOLN
1.0000 g | INTRAVENOUS | Status: DC
  Administered 2021-08-31 – 2021-09-03 (×4): 1 g via INTRAVENOUS
  Filled 2021-08-31 (×4): qty 50

## 2021-08-31 MED ORDER — POTASSIUM CHLORIDE CRYS ER 20 MEQ PO TBCR
40.0000 meq | EXTENDED_RELEASE_TABLET | Freq: Once | ORAL | Status: DC
  Filled 2021-08-31: qty 2

## 2021-08-31 MED ORDER — LORAZEPAM 2 MG/ML IJ SOLN
1.0000 mg | Freq: Once | INTRAMUSCULAR | Status: AC
  Administered 2021-08-31: 1 mg via INTRAVENOUS

## 2021-08-31 MED ORDER — LORAZEPAM 2 MG/ML IJ SOLN
INTRAMUSCULAR | Status: AC
  Filled 2021-08-31: qty 1

## 2021-08-31 MED ORDER — LORAZEPAM 2 MG/ML IJ SOLN
1.0000 mg | Freq: Once | INTRAMUSCULAR | Status: AC
  Administered 2021-08-31: 1 mg via INTRAVENOUS
  Filled 2021-08-31: qty 1

## 2021-08-31 NOTE — Progress Notes (Signed)
EEG complete - results pending 

## 2021-08-31 NOTE — Assessment & Plan Note (Addendum)
Resolved

## 2021-08-31 NOTE — Progress Notes (Signed)
LTM maint complete - no skin breakdown under: Fp1 Fp2 F7 [facility] monitored

## 2021-08-31 NOTE — Progress Notes (Signed)
PT Cancellation Note  Patient Details Name: Felicia Williams MRN: [id number] DOB: 06/14/1941   Cancelled Treatment:    Reason Eval/Treat Not Completed: Medical issues which prohibited therapy at this time a rapid response called earlier due to altered neuro status, RN request PT hold on session until seen by neurology. Will return as time/schedule allows.   West Carbo, PT, DPT   Acute Rehabilitation Department Pager #: [phone]   Sandra Cockayne 08/31/2021, 1:32 PM

## 2021-08-31 NOTE — Procedures (Signed)
History: 81 yo F with encephalopathy  Sedation: None  Technique: This EEG was acquired with electrodes placed according to the International 10-20 electrode system (including Fp1, Fp2, F3, F4, C3, C4, P3, P4, O1, O2, T3, T4, T5, T6, A1, A2, Fz, Cz, Pz). The following electrodes were missing or displaced: none.   Background: The background consists of periodic high voltage triphasic morphology discharges with a shifting bifrontal predominance with a frequency of 1.5 - 2 Hz. There is no clear evolution. Folllowing ativan administration, there was some improvement in the EEG as the patient became more drowsy, but they returned following stimulation.   Photic stimulation: Physiologic driving is not performed  EEG Abnormalities: 1) Generalized periodic discharges(GPDs) with triphasic morphology 2) generalized irregular slow activity.   Clinical Interpretation: This markedly abnormal EEG recorded a pattern that is on the ictal-interictal continuum. It is associated with encephalopathy, but can also be associated with cortical irritability and seizure. The fluctuation with drowsiness would favor an encephalopathy etiology. Further characterization with LTM EEG is recommended.    Roland Rack, MD [facility] [phone]  If 7pm- 7am, please page neurology on call as listed in [place].

## 2021-08-31 NOTE — Progress Notes (Signed)
Started cEEG study.  Notified [facility] monitoring.  Tested patient event button. 

## 2021-08-31 NOTE — Progress Notes (Signed)
PROGRESS NOTE   Felicia Williams  XWR:[id number]    DOB: June 02, 1941    DOA: 08/26/2021  PCP: [name], NP   I have briefly reviewed patients previous medical records in [facility].  Chief Complaint  Patient presents with   Abdominal Pain    Hospital Course:  Felicia Williams is an 81 y.o. female with a history of ESRD on PD, HTN, HLD, spinal stenosis, and GERD who presented to the ED at [facility] 2/4 with severe abdominal pain, nausea and dry heaving. She was found to have leukocytosis, lactic acid elevation. CT abd/pelvis revealed no acute intraabdominal findings. IV antibiotics were started for suspicion of bacterial peritonitis and the patient was admitted to [facility]. Peritoneal fluid appeared milky with neutrophil count 8,667 and GPC on gram stain.  Peritoneal fluid cultures remain negative to date.  Treating for PD associated peritonitis.  Acute metabolic encephalopathy, suspected due to cefepime and peritonitis, cefepime discontinued.  Nephrology on board.  AMS worse 2/9, CT head without acute abnormalities, neurology consulted, EEG pending.   Assessment & Plan:  Principal Problem:   Peritonitis associated with peritoneal dialysis [facility]) Active Problems:   Acute metabolic encephalopathy   Hypokalemia   Macrocytic anemia   Essential hypertension, benign   End stage renal disease (HCC)   Gastroesophageal reflux disease   N&V (nausea and vomiting)   Sepsis (HCC)   Protein-calorie malnutrition, moderate (HCC)   Pruritus   Shortness of breath   Assessment and Plan: * Peritonitis associated with peritoneal dialysis ([place])- (present on admission) Sepsis due to peritonitis, POA. Peritoneal dialysis-associated bacterial peritonitis -Treated with broad antibiotic coverage pending blood culture (negative, final report) peritoneal fluid culture NGTD (sent 2/4). Gram stain +GPC and milky peritoneal fluid.  - Continuing vancomycin.  Cefepime discontinued due to concern for AMS then  switched over to cefazolin on 2/8.  As per nephrology, both of these can be given across dialysis.  Acute metabolic encephalopathy Suspected multifactorial due to cefepime, sepsis from peritonitis, hospital delirium. Delirium precautions.  Minimize sedatives and opioids. Cefepime discontinued.  Last dose was on 2/7. Mental status seemed worse today than even yesterday.  Daughter reports what appears to be myoclonic jerks of right upper extremity but no seizures. CT head without acute abnormalities.  Ammonia level: 32. ?  Delayed clearance of cefepime due to ESRD. Discussed with Dr. Royce Macadamia who does not feel that this is related to uremia. Neurology consulted and input pending.  EEG pending. Monitor closely.  Hypokalemia Better than yesterday.  Replace and follow.  Macrocytic anemia- (present on admission) Hemoglobin stable in the low 7 g range. Follow CBC closely and transfuse if hemoglobin 7 g or less. Hemoglobin has appropriately increased from 7.3-8.5, may be a lab error.  Essential hypertension, benign- (present on admission) Continue coreg, continue to hold hydralazine Controlled.  End stage renal disease ([place])- (present on admission) Nephrology consulted for routine PD. Discussed with Dr. Royce Macadamia 2/8, 2/9 at bedside.  Gastroesophageal reflux disease- (present on admission) Continue PPI  Shortness of breath Negative acute CXR. Atelectasis likely contributing. No hypoxia or chest pain. At risk of reflux and on PPI.  No dyspnea reported and is not hypoxic.  Pruritus- (present on admission) Moisturizing lotion ordered. TBili wnl, BUN not severely/acutely elevated.   Protein-calorie malnutrition, moderate ([place])- (present on admission) Supplement protein as able. Hypoalbuminemia noted at 2.2.   Sepsis ([place])- (present on admission) Tachycardia, leukocytosis, lactic acidosis with a suspected source of peritonitis.  Management as noted above Sepsis features  have  resolved.  N&V (nausea and vomiting)- (present on admission) Due to peritonitis Antiemetic prn Seems to have resolved.  However has significantly diminished appetite.  Dietitian consulted yesterday and has liberalized diet choices.   Body mass index is 26.18 kg/m.  Nutritional Status Nutrition Problem: Increased nutrient needs Etiology: acute illness Signs/Symptoms: estimated needs Interventions: MVI, Ensure Enlive (each supplement provides 350kcal and 20 grams of protein), Prostat, Liberalize Diet  Pressure Ulcer:     DVT prophylaxis: heparin injection 5,000 Units Start: 08/27/21 0600     Code Status: Full Code:  Family Communication: Daughter at bedside Disposition:  Status is: Inpatient Remains inpatient appropriate because: Severity of illness     Consultants:   Nephrology Neurology  Procedures:   Peritoneal dialysis  Antimicrobials:   As noted above   Subjective:  As per discussion with daughter at bedside, mental status has not improved and actually worse than yesterday.  She noted intermittent right upper extremity jerks.  She herself has seizures and does not think patient has seizures.  Very poor oral intake.  Objective:   Vitals:   08/30/21 1146 08/30/21 1934 08/31/21 0400 08/31/21 1049  BP: (!) 139/57 (!) 167/63 (!) 146/56 (!) 169/78  Pulse: 76 77  74  Resp: 20 18 16 17   Temp: 98 F (36.7 C) 99.1 F (37.3 C) 98.6 F (37 C) 98.7 F (37.1 C)  TempSrc: Oral Oral Oral Oral  SpO2: 97% 95% 96% 97%  Weight:   58.8 kg   Height:        General exam: Elderly female, moderately built and nourished lying comfortably propped up in bed without distress.  Does look worse than she did yesterday. Respiratory system: Clear to auscultation.  No increased work of breathing. Cardiovascular system: S1 & S2 heard, RRR. No JVD, murmurs, rubs, gallops or clicks. No pedal edema.  Telemetry personally reviewed: Sinus rhythm. Gastrointestinal system: Abdomen is  nondistended, soft, nontender.  PD catheter site without acute findings.  Normal bowel sounds heard. Central nervous system: Drowsy.  Spontaneously opens to eyes and stares blankly.  No verbal communication and does not follow instructions.  No focal neurological deficits.  Unable to test for asterixis.  Did not notice any myoclonic jerks. Extremities: Symmetric 5 x 5 power. Skin: No rashes, lesions or ulcers Psychiatry: Judgement and insight are impaired. Mood & affect flat.     Data Reviewed:   I have personally reviewed following labs and imaging studies   CBC: Recent Labs  Lab 08/26/21 1814 08/27/21 0120 08/28/21 0804 08/29/21 0353 08/30/21 0217 08/31/21 0108  WBC 14.8* 21.2*   < > 8.7 6.9 8.6  NEUTROABS 13.7* 18.9*  --   --   --   --   HGB 9.7* 7.7*   < > 7.3* 7.3* 8.5*  HCT 27.4* 21.6*   < > 20.4* 21.3* 24.2*  MCV 103.4* 103.3*   < > 101.5* 102.4* 103.4*  PLT 242 224   < > 230 240 266   < > = values in this interval not displayed.    Basic Metabolic Panel: Recent Labs  Lab 08/27/21 0120 08/28/21 0804 08/29/21 0353 08/30/21 0217 08/31/21 0108  NA 136 133* 133* 133* 136  K 4.2 3.3* 3.5 2.9* 3.2*  CL 99 93* 95* 93* 97*  CO2 20* 22 23 24 24   GLUCOSE 107* 146* 121* 157* 136*  BUN 72* 66* 69* 62* 66*  CREATININE 10.38* 9.78* 9.51* 9.29* 9.72*  CALCIUM 7.9* 8.1* 8.3* 8.3* 8.9  MG 2.0  --   --   --   --   PHOS 7.9* 7.3* 6.6* 5.8* 5.4*    Liver Function Tests: Recent Labs  Lab 08/26/21 1814 08/27/21 0120 08/28/21 0804 08/29/21 0353 08/30/21 0217 08/31/21 0108  AST 24 17  --   --   --   --   ALT 27 23  --   --   --   --   ALKPHOS 71 62  --   --   --   --   BILITOT 0.4 0.3  --   --   --   --   PROT 6.5 5.9*  --   --   --   --   ALBUMIN 2.9* 2.5* 2.2* 2.4* 2.0* 2.0*    CBG: Recent Labs  Lab 08/27/21 1058 08/27/21 1516  GLUCAP 95 97    Microbiology Studies:   Recent Results (from the past 240 hour(s))  Blood culture (routine x 2)     Status: None    Collection Time: 08/26/21  8:39 PM   Specimen: Left Antecubital; Blood  Result Value Ref Range Status   Specimen Description LEFT ANTECUBITAL  Final   Special Requests   Final    BOTTLES DRAWN AEROBIC AND ANAEROBIC Blood Culture results may not be optimal due to an excessive volume of blood received in culture bottles   Culture   Final    NO GROWTH 5 DAYS Performed at [facility], [address], [place], [place] [address]    Report Status 08/31/2021 FINAL  Final  Blood culture (routine x 2)     Status: None   Collection Time: 08/26/21  8:46 PM   Specimen: BLOOD LEFT HAND  Result Value Ref Range Status   Specimen Description BLOOD LEFT HAND  Final   Special Requests   Final    BOTTLES DRAWN AEROBIC AND ANAEROBIC Blood Culture results may not be optimal due to an excessive volume of blood received in culture bottles   Culture   Final    NO GROWTH 5 DAYS Performed at [facility], [address], [place], [place] [address]    Report Status 08/31/2021 FINAL  Final  Gram stain     Status: None   Collection Time: 08/26/21  9:12 PM   Specimen: Peritoneal Washings  Result Value Ref Range Status   Specimen Description PERITONEAL FLUID  Final   Special Requests NONE  Final   Gram Stain   Final    WBC PRESENT, PREDOMINANTLY PMN GRAM POSITIVE COCCI CYTOSPIN SMEAR Performed at [facility], [place] [address] [place], [place] [address]    Report Status 08/27/2021 FINAL  Final  Culture, body fluid w Gram Stain-bottle     Status: None (Preliminary result)   Collection Time: 08/26/21  9:12 PM   Specimen: Peritoneal Washings  Result Value Ref Range Status   Specimen Description PERITONEAL FLUID  Final   Special Requests NONE  Final   Culture   Final    NO GROWTH 4 DAYS Performed at [facility] [address], [place], [place] [address]    Report Status PENDING  Incomplete  Resp Panel by RT-PCR (Flu A&B, Covid) Nasopharyngeal Swab     Status: None   Collection Time:  08/26/21  9:14 PM   Specimen: Nasopharyngeal Swab; Nasopharyngeal(NP) swabs in vial transport medium  Result Value Ref Range Status   SARS Coronavirus 2 by RT PCR NEGATIVE NEGATIVE Final    Comment: (NOTE)  SARS-CoV-2 target nucleic acids are NOT DETECTED.  The SARS-CoV-2 RNA is generally detectable in upper respiratory specimens during the acute phase of infection. The lowest concentration of SARS-CoV-2 viral copies this assay can detect is 138 copies/mL. A negative result does not preclude SARS-Cov-2 infection and should not be used as the sole basis for treatment or other patient management decisions. A negative result may occur with  improper specimen collection/handling, submission of specimen other than nasopharyngeal swab, presence of viral mutation(s) within the areas targeted by this assay, and inadequate number of viral copies(<138 copies/mL). A negative result must be combined with clinical observations, patient history, and epidemiological information. The expected result is Negative.  Fact Sheet for Patients:  EntrepreneurPulse.com.au  Fact Sheet for Healthcare Providers:  IncredibleEmployment.be  This test is no t yet approved or cleared by the [place] FDA and  has been authorized for detection and/or diagnosis of SARS-CoV-2 by FDA under an Emergency Use Authorization (EUA). This EUA will remain  in effect (meaning this test can be used) for the duration of the COVID-19 declaration under Section 564(b)(1) of the Act, 21 U.S.C.section 360bbb-3(b)(1), unless the authorization is terminated  or revoked sooner.       Influenza A by PCR NEGATIVE NEGATIVE Final   Influenza B by PCR NEGATIVE NEGATIVE Final    Comment: (NOTE) The Xpert Xpress SARS-CoV-2/FLU/RSV plus assay is intended as an aid in the diagnosis of influenza from Nasopharyngeal swab specimens and should not be used as a sole basis for treatment. Nasal washings  and aspirates are unacceptable for Xpert Xpress SARS-CoV-2/FLU/RSV testing.  Fact Sheet for Patients: EntrepreneurPulse.com.au  Fact Sheet for Healthcare Providers: IncredibleEmployment.be  This test is not yet approved or cleared by the [place] FDA and has been authorized for detection and/or diagnosis of SARS-CoV-2 by FDA under an Emergency Use Authorization (EUA). This EUA will remain in effect (meaning this test can be used) for the duration of the COVID-19 declaration under Section 564(b)(1) of the Act, 21 U.S.C. section 360bbb-3(b)(1), unless the authorization is terminated or revoked.  Performed at [facility], [address], [place], [place] [address]   MRSA Next Gen by PCR, Nasal     Status: None   Collection Time: 08/27/21 10:37 AM   Specimen: Nasal Mucosa; Nasal Swab  Result Value Ref Range Status   MRSA by PCR Next Gen NOT DETECTED NOT DETECTED Final    Comment: (NOTE) The GeneXpert MRSA Assay (FDA approved for NASAL specimens only), is one component of a comprehensive MRSA colonization surveillance program. It is not intended to diagnose MRSA infection nor to guide or monitor treatment for MRSA infections. Test performance is not FDA approved in patients less than 22 years old. Performed at [facility], [place] [address] [place], [place] [address]     Radiology Studies:  CT HEAD WO CONTRAST (5MM)  Result Date: 08/31/2021 CLINICAL DATA:  Altered mental status. EXAM: CT HEAD WITHOUT CONTRAST TECHNIQUE: Contiguous axial images were obtained from the base of the skull through the vertex without intravenous contrast. RADIATION DOSE REDUCTION: This exam was performed according to the departmental dose-optimization program which includes automated exposure control, adjustment of the mA and/or kV according to patient size and/or use of iterative reconstruction technique. COMPARISON:  01/16/2021 FINDINGS: Brain: Stable mildly  enlarged ventricles and cortical sulci. No intracranial hemorrhage, mass lesion or CT evidence of acute infarction. Vascular: No hyperdense vessel or unexpected calcification. Skull: Normal. Negative for fracture or focal lesion. Sinuses/Orbits: Stable  mucosal thickening or retention cyst in the sphenoid sinus on the left, containing calcification. Mild inferior left maxillary sinus mucosal thickening. Right inferior maxillary sinus retention cyst. Status post bilateral cataract extraction. Other: None. IMPRESSION: 1. No acute abnormality. 2. Stable mild diffuse cerebral and cerebellar atrophy. 3. Chronic sinusitis. Electronically Signed   By: Claudie Revering M.D.   On: 08/31/2021 11:43    Scheduled Meds:    (feeding supplement) PROSource Plus  30 mL Oral BID BM   carvedilol  12.5 mg Oral Daily   famotidine  20 mg Oral QHS   feeding supplement  237 mL Oral BID BM   gentamicin cream  1 application Topical Daily   gentamicin cream  1 application Topical Daily   heparin  5,000 Units Subcutaneous Q8H   LORazepam       multivitamin  1 tablet Oral QHS   pantoprazole  20 mg Oral Daily   potassium chloride  40 mEq Oral Once   sevelamer carbonate  2.4 g Oral TID WC    Continuous Infusions:    sodium chloride Stopped (08/28/21 2307)    ceFAZolin (ANCEF) IV     dialysis solution 2.5% low-MG/low-CA       LOS: 5 days     Vernell Leep, MD,  FACP, [facility], [facility], [facility] (Care Management Physician Certified) [place]  To contact the attending provider between 7A-7P or the covering provider during after hours 7P-7A, please log into the web site [email or web address] and access using universal [place] password for that web site. If you do not have the password, please call the hospital operator.  08/31/2021, 3:12 PM

## 2021-08-31 NOTE — Care Management Important Message (Signed)
Important Message  Patient Details  Name: Felicia Williams MRN: [id number] Date of Birth: Mar 17, 1941   Medicare Important Message Given:  Yes     Shelda Altes 08/31/2021, 9:07 AM

## 2021-08-31 NOTE — Progress Notes (Signed)
[facility] Progress Note  Name: Felicia Williams MRN: [id number] DOB: 09/17/1940  Chief Complaint:  Abdominal pain   Subjective:  For yesterday AM she was 1.1 liters negative then machine removed due to issue and RN contacted technical.  Her daughter is at bedside and states that mental status has not improved (Still altered) and she is weak.  No uop charted.  PD has been charted in error - discussed w/nursing.   Review of systems:   Unable to obtain given altered mental status   --------- Background on consult:  Felicia Williams is a 81 y.o. female with ESRD on CCPD x 4 years, HL, HTN, and GERD who was admitted as transfer from [facility] with peritonitis.   Presented to [facility] on 2/4 with 2-day Hx of generalized abdominal pain which came on suddenly. + nausea, but no vomiting or diarrhea. No fever or chills. No CP or dyspnea. Felt similar to prior episode of peritonitis. She was afebrile. Labs with WBC 14.8, Hgb 9.7, LA 3- > 2.6, K 3.7, Alb 2.9. CXR clear. Abd CT without acute findings. She had Blood Cx drawn (NGTD) and was started on Vanc/Ceftriaxone, and then given Cefepime. She was transferred to [place]. PD Fluid Cx/cell count/gram stain were ordered, but unable to be completed until this AM (after antibiotics) when she arrived at [facility], as [facility] does not have any PD supplies or capabilities. The PD fluid effluent collected this AM was VERY CLOUDY.  Today - she continues to have generalized abdominal pain but remains afebrile. Last BM yesterday. She is unclear her exact PD prescription, knows that she does 5 cycles overnight, no day dwell. Uses 2 different dextrose bags, unclear on fill volume. She does not add heparin.   Intake/Output Summary (Last 24 hours) at 08/31/2021 0842 Last data filed at 08/30/2021 1800 Gross per 24 hour  Intake 200 ml  Output --  Net 200 ml    Vitals:  Vitals:   08/30/21 0814 08/30/21 1146 08/30/21 1934 08/31/21 0400  BP: (!) 154/65 (!) 139/57 (!) 167/63 (!)  146/56  Pulse: 72 76 77   Resp: 19 20 18 16   Temp: 98.3 F (36.8 C) 98 F (36.7 C) 99.1 F (37.3 C) 98.6 F (37 C)  TempSrc: Oral Oral Oral Oral  SpO2: 97% 97% 95% 96%  Weight:    58.8 kg  Height:         Physical Exam:   General adult female in bed in no acute distress HEENT normocephalic atraumatic extraocular movements intact sclera anicteric Neck supple trachea midline Lungs clear to auscultation bilaterally normal work of breathing at rest  Heart S1S2 no rub Abdomen soft nontender nondistended Extremities no edema  Psych blunted affect  Neuro patient does not answer orienting questions. She does not speak for me or follow basic commands ("show me you thumb")  Medications reviewed   Labs:  BMP Latest Ref Rng & Units 08/31/2021 08/30/2021 08/29/2021  Glucose 70 - 99 mg/dL 136(H) 157(H) 121(H)  BUN 8 - 23 mg/dL 66(H) 62(H) 69(H)  Creatinine 0.44 - 1.00 mg/dL 9.72(H) 9.29(H) 9.51(H)  Sodium 135 - 145 mmol/L 136 133(L) 133(L)  Potassium 3.5 - 5.1 mmol/L 3.2(L) 2.9(L) 3.5  Chloride 98 - 111 mmol/L 97(L) 93(L) 95(L)  CO2 22 - 32 mmol/L 24 24 23   Calcium 8.9 - 10.3 mg/dL 8.9 8.3(L) 8.3(L)   CCPD, coordinated through [facility] (Dr. Holley Raring) [phone]  57 kg EDW Last ESA was dosed at home per the HD unit. Mircera  is ordered for 50 mcg on 08/10/21. May be able to give again here soon.  [phone] phone number for outpatient PD RN line   Assessment/Plan:   PD associated peritonitis: Blood Cx negative. Started on Vanc/Cefepime prior to able to collect PD fluid sample. Sample collected this morning which was very cloudy -> gram stain done and fluid Cx still NGTD.  pharmacy was consulted for Vanc dosing. [phone] total nucleated cells on first sample sent Vanc being dosed per pharmacy Change cefepime to ancef given mental status changes -2 gram every MWF while here She started abx on 2/4 and will need two weeks of therapy with vanc 1 gram MWF and ancef per outpatient protocol.  I  have confirmed with her outpatient PD unit that she could get ancef Blood cx still NGTD ESRD:  CCPD tonight - all 2.5 %  Altered mental status - concerning for neurotoxicity from cefepime.  changed to an alternate agent ancef.  Discussed with team - please consult neurology.  This does not represent uremia Hypertension/volume: optimize volume with PD Anemia:  aranesp 100 mcg on 2/7. On mircera outpatient as above Metabolic bone disease: hyperphos and states currently not taking a binder. Cannot swallow pills and was given Rx Phoslyra and too expensive.  Has been on sevelamer powder here but this is expensive as well.  Adjust regimen outpatient as needed per primary nephrologist Hypokalemia - she has gotten a dose of potassium 40 meq once today.  Would plan for potassium chloride 20 meq daily supplement on discharge. Would check labs with outpatient PD unit to guide   Disposition - would continue inpatient monitoring   note [phone] phone number for outpatient PD RN line.  She is to present to her PD unit the day after discharge to coordinate antibiotics for PD outpatient.    Claudia Desanctis, MD 08/31/2021 9:01 AM

## 2021-08-31 NOTE — Consult Note (Addendum)
Neurology Consultation  Reason for Consult: Altered Mental Status Referring Physician: Dr. Algis Liming  CC: Peritonitis  History is obtained from:daughter at bedside  HPI: Felicia Williams is a 81 y.o. female with a history of ESRD on PD, HTN, HLD, spinal stenosis, and GERD who presented to the ED at [facility] 2/4 with severe abdominal pain, nausea and dry heaving. She was found to have leukocytosis, lactic acid elevation. CT abd/pelvis revealed no acute intraabdominal findings. IV antibiotics were started for suspicion of bacterial peritonitis and the patient was admitted to [facility]. Peritoneal fluid appeared milky with neutrophil count 8,667 and GPC on gram stain.  Peritoneal fluid cultures remain negative to date.  Treating for PD associated peritonitis.  Acute metabolic encephalopathy, suspected due to cefepime and peritonitis, cefepime discontinued yesterday.  Neurology was consulted due to worsening mental status even after discontinuation of cefepime yesterday.  Daughter at bedside. Daughter reports patient initially developed mild shaking in hands 2 days ago which progressed to jerking of arms. Yesterday, daughter reports patient was unable to comprehend daughter's questioning and appeared more disoriented. Daughter also reports patient has had choking when laid flat which was noted when patient went for CT head. Daughter is concerned about patient poor PO intake and patient has been having even more difficulty following commands.  On assessment, patient was nonverbal and would not follow commands. LTM EEG was ordered. 1 mg ativan was also ordered. Patient was briefly more alert and responsive before falling asleep.     ROS: A complete ROS was performed and is negative except as noted in the HPI. Unable to obtain due to altered mental status.   Past Medical History:  Diagnosis Date   Anemia in stage 4 chronic kidney disease ([place]) 08/18/2018   Depression    Essential hypertension, benign    GERD  (gastroesophageal reflux disease)    Glomerulonephritis    Gout    Mixed hyperlipidemia    Renal insufficiency      Family History  Problem Relation Age of Onset   CAD Father    Heart attack Father    Diabetes Mellitus II Father    Hypertension Father    Lupus Brother      Social History:   reports that she has never smoked. She has never used smokeless tobacco. She reports that she does not drink alcohol and does not use drugs.  Medications  Current Facility-Administered Medications:    (feeding supplement) PROSource Plus liquid 30 mL, 30 mL, Oral, BID BM, Stovall, Kathryn R, PA-C, 30 mL at 08/30/21 1431   0.9 %  sodium chloride infusion, , Intravenous, PRN, [name], MD, Stopped at 08/28/21 2307   acetaminophen (TYLENOL) tablet 650 mg, 650 mg, Oral, Q6H PRN **OR** acetaminophen (TYLENOL) suppository 650 mg, 650 mg, Rectal, Q6H PRN, Zierle-Ghosh, Asia B, DO   camphor-menthol (SARNA) lotion, , Topical, PRN, [name], MD   carvedilol (COREG) tablet 12.5 mg, 12.5 mg, Oral, Daily, Zierle-Ghosh, Asia B, DO, 12.5 mg at 08/30/21 1056   ceFAZolin (ANCEF) IVPB 1 g/50 mL premix, 1 g, Intravenous, Q24H, Claudia Desanctis, MD   dialysis solution 2.5% low-MG/low-CA dianeal solution, , Intraperitoneal, Q24H, [place] Jude, [place], [place] at 08/29/21 2030   famotidine (PEPCID) tablet 20 mg, 20 mg, Oral, QHS, Zierle-Ghosh, Asia B, DO, 20 mg at 08/30/21 2144   feeding supplement (ENSURE ENLIVE / ENSURE PLUS) liquid 237 mL, 237 mL, Oral, BID BM, Vance Gather B, MD, 237 mL at 08/30/21 1501  gentamicin cream (GARAMYCIN) 0.1 % 1 application, 1 application, Topical, Daily, Loren Racer, PA-C, 1 application at 13/08/65 1057   gentamicin cream (GARAMYCIN) 0.1 % 1 application, 1 application, Topical, Daily, Claudia Desanctis, MD, 1 application at 78/46/96 2102   heparin injection 5,000 Units, 5,000 Units, Subcutaneous, Q8H, Zierle-Ghosh, Asia B, DO, 5,000 Units at 08/31/21 2952   HYDROmorphone  (DILAUDID) injection 0.5 mg, 0.5 mg, Intravenous, Q3H PRN, [name], MD, 0.5 mg at 08/28/21 8413   HYDROmorphone (DILAUDID) tablet 2 mg, 2 mg, Oral, Q3H PRN, [name], MD, 2 mg at 08/30/21 2440   multivitamin (RENA-VIT) tablet 1 tablet, 1 tablet, Oral, QHS, [name], MD, 1 tablet at 08/30/21 2144   ondansetron (ZOFRAN) tablet 4 mg, 4 mg, Oral, Q6H PRN **OR** ondansetron (ZOFRAN) injection 4 mg, 4 mg, Intravenous, Q6H PRN, Zierle-Ghosh, Asia B, DO, 4 mg at 08/28/21 0608   pantoprazole (PROTONIX) EC tablet 20 mg, 20 mg, Oral, Daily, Zierle-Ghosh, Asia B, DO, 20 mg at 08/30/21 1056   potassium chloride SA (KLOR-CON M) CR tablet 40 mEq, 40 mEq, Oral, Once, Hongalgi, Anand D, MD   sevelamer carbonate (RENVELA) powder PACK 2.4 g, 2.4 g, Oral, TID WC, Loren Racer, PA-C, 2.4 g at 08/30/21 1632   Exam: Current vital signs: BP (!) 169/78 (BP Location: Left Arm)    Pulse 74    Temp 98.7 F (37.1 C) (Oral)    Resp 17    Ht 4\' 11"  (1.499 m)    Wt 58.8 kg    SpO2 97%    BMI 26.18 kg/m  Vital signs in last 24 hours: Temp:  [98.6 F (37 C)-99.1 F (37.3 C)] 98.7 F (37.1 C) (02/09 1049) Pulse Rate:  [74-77] 74 (02/09 1049) Resp:  [16-18] 17 (02/09 1049) BP: (146-169)/(56-78) 169/78 (02/09 1049) SpO2:  [95 %-97 %] 97 % (02/09 1049) Weight:  [58.8 kg] 58.8 kg (02/09 0400)  GENERAL: Awake, alert, in no acute distress Psych: Affect appropriate for situation, patient is calm and cooperative with examination Head: Normocephalic and atraumatic, without obvious abnormality EENT: Normal conjunctivae, dry mucous membranes, no OP obstruction LUNGS: Normal respiratory effort. Non-labored breathing on room air CV: Regular rate and rhythm on telemetry ABDOMEN: Soft, non-tender, non-distended Extremities: warm, well perfused, without obvious deformity  NEURO:  Mental Status: Unable to assess due to noncooperation Speech/Language: patient is non-verbal at this time Patient not following  commands. No neglect is noted Cranial Nerves:  II: PERRL.  III, IV, VI: unable to assess due to non-cooperation V: Unable to assess due to noncooperation VII: Unable to assess due to noncooperation VIII: Unable to assess due to noncooperation IX, X: Unable to assess due to noncooperation  XI: Unable to assess due to noncooperation XII: Unable to assess due to noncooperation Motor: Unable to assess due to noncooperation but patient does move all extremities when stimulated. BLE sustained clonus. Tone is normal. Bulk is normal.  Sensation: Unable to assess due to noncooperation Coordination: Unable to assess due to noncooperation DTRs: 2+ throughout. Sustained clonus in BLE. Gait: Deferred   Labs I have reviewed labs in epic and the results pertinent to this consultation are:   CBC    Component Value Date/Time   WBC 8.6 08/31/2021 0108   RBC 2.34 (L) 08/31/2021 0108   HGB 8.5 (L) 08/31/2021 0108   HCT 24.2 (L) 08/31/2021 0108   PLT 266 08/31/2021 0108   MCV 103.4 (H) 08/31/2021 0108   MCH 36.3 (  H) 08/31/2021 0108   MCHC 35.1 08/31/2021 0108   RDW 12.9 08/31/2021 0108   LYMPHSABS 1.1 08/27/2021 0120   MONOABS 1.0 08/27/2021 0120   EOSABS 0.0 08/27/2021 0120   BASOSABS 0.1 08/27/2021 0120    CMP     Component Value Date/Time   NA 136 08/31/2021 0108   K 3.2 (L) 08/31/2021 0108   CL 97 (L) 08/31/2021 0108   CO2 24 08/31/2021 0108   GLUCOSE 136 (H) 08/31/2021 0108   BUN 66 (H) 08/31/2021 0108   CREATININE 9.72 (H) 08/31/2021 0108   CALCIUM 8.9 08/31/2021 0108   CALCIUM 7.1 (L) 01/10/2019 0632   PROT 5.9 (L) 08/27/2021 0120   ALBUMIN 2.0 (L) 08/31/2021 0108   AST 17 08/27/2021 0120   ALT 23 08/27/2021 0120   ALKPHOS 62 08/27/2021 0120   BILITOT 0.3 08/27/2021 0120   GFRNONAA 4 (L) 08/31/2021 0108   GFRAA 11 (L) 01/20/2019 0608    Lipid Panel  No results found for: CHOL, TRIG, HDL, CHOLHDL, VLDL, LDLCALC, LDLDIRECT   Imaging I have reviewed the images  obtained:  CT-scan of the brain: No acute intracranial abnormality  Assessment: Felicia Williams is a 81 y.o. female with a history of ESRD on PD, HTN, HLD, spinal stenosis, and GERD who presented to the ED at [facility] 2/4 with severe abdominal pain, nausea and dry heaving. Neuro was consulted after patient had 2 days of worsening mental status.  Now on LTM EEG. Ativan did not produce a significant change in exam.   Impression:Cefepime Neurotoxicity vs Seizure vs Catatonia vs Delirium. Cefepime Neurotoxicity is likely given patient is at increased risk given poor renal function and acute onset following a few days of cefepime. On EEG to monitor for seizures. Catatonia is also on differential given mutism, staring, and positive ativan challenge. Ammonia 32 so less likely hepatic encephalopathy  Recommendations: -On LTM EEG -Continue to monitor for abnormal movements -SLP eval when more alert -We will continue to follow  Pt seen by resident and later by attending. Note/plan to be edited by MD as needed.  France Ravens, MD PGY1 Resident  I have seen the patient and reviewed the above note.  I have reviewed her head CT and labs.  Physical Exam   Neuro: Mental Status: Patient is awake, alert, occasionally will say "yeah".  Following Ativan administration, she appeared to say "yeah" more frequently, but no truly significant change in exam. Cranial Nerves: II: Blinks to threat bilaterally. Pupils are equal, round, and reactive to light.   III,IV, VI: EOMI without ptosis or diploplia.  V: Facial sensation is symmetric to temperature VII: Facial movement is symmetric.  Motor: Tone is decreased, she does not cooperate with formal strength testing, does not follow commands.  She appears to have a multifocal myoclonus. Sensory: She response to noxious stimulation all four extremities Deep Tendon Reflexes: 2+ and symmetric in the biceps and patellae.  3+ in bilateral ankles with nonsustained  clonus Plantars: Toes are downgoing bilaterally.  Cerebellar: She does not perform  Impression: 81 year old female with likely cefepime neurotoxicity.  She has a pattern on EEG that is on the ictal-interictal continuum, but I would favor nonepileptic pattern to it.  She did not have an Ativan response.  I will continue to monitor her EEG to further characterize the pattern and assess for clear periods of evolution to suggest an ictal nature.  In the short-term, agree with changing antibiotics from cefepime, neurology will continue to follow.  Roland Rack,  MD [facility] [phone]  If 7pm- 7am, please page neurology on call as listed in [place].

## 2021-08-31 NOTE — Progress Notes (Signed)
Patient is nonverbal, sleepy and does not follow command, pt will respond to voice. MD notified, RR called. See epic for new orders and intervention.

## 2021-08-31 NOTE — Progress Notes (Signed)
PHARMACY NOTE:  ANTIMICROBIAL RENAL DOSAGE ADJUSTMENT  Current antimicrobial regimen includes a mismatch between antimicrobial dosage and estimated renal function.  As per policy approved by the Pharmacy & Therapeutics and Medical Executive Committees, the antimicrobial dosage will be adjusted accordingly.  Current antimicrobial dosage:  cefazolin 2g Q MWF  Indication: peritonitis  Renal Function:  Estimated Creatinine Clearance: 3.5 mL/min (A) (by C-G formula based on SCr of 9.72 mg/dL (H)). [x]      On daily PD     Antimicrobial dosage has been changed to:  1g Q 24 hr     Thank you for allowing pharmacy to be a part of this patient's care.  Benetta Spar, PharmD, BCPS, BCCP Clinical Pharmacist  Please check AMION for all [place] phone numbers After 10:00 PM, call [place] [phone]

## 2021-08-31 NOTE — Progress Notes (Signed)
Patient is alert and will track and smile at family and staff.  She is currently non verbal.  She is moving all extremities.  She has some myoclonus of extremities but is able to move al extremities.  She does not follow commands. Per staff she spoke a few words yesterday but was altered at that time.  Dr Algis Liming is aware of neuro status.  (See MD note for details) Head CT done this am. BP 154/68  HR 80  RR 18  O2 sat 97% on RA.

## 2021-09-01 DIAGNOSIS — E876 Hypokalemia: Secondary | ICD-10-CM

## 2021-09-01 LAB — CBC
HCT: 25 % — ABNORMAL LOW (ref 36.0–46.0)
Hemoglobin: 8.8 g/dL — ABNORMAL LOW (ref 12.0–15.0)
MCH: 36.2 pg — ABNORMAL HIGH (ref 26.0–34.0)
MCHC: 35.2 g/dL (ref 30.0–36.0)
MCV: 102.9 fL — ABNORMAL HIGH (ref 80.0–100.0)
Platelets: 296 10*3/uL (ref 150–400)
RBC: 2.43 MIL/uL — ABNORMAL LOW (ref 3.87–5.11)
RDW: 13 % (ref 11.5–15.5)
WBC: 9.6 10*3/uL (ref 4.0–10.5)
nRBC: 0.3 % — ABNORMAL HIGH (ref 0.0–0.2)

## 2021-09-01 LAB — CULTURE, BODY FLUID W GRAM STAIN -BOTTLE: Culture: NO GROWTH

## 2021-09-01 LAB — RENAL FUNCTION PANEL
Albumin: 2.1 g/dL — ABNORMAL LOW (ref 3.5–5.0)
Anion gap: 15 (ref 5–15)
BUN: 60 mg/dL — ABNORMAL HIGH (ref 8–23)
CO2: 24 mmol/L (ref 22–32)
Calcium: 9.3 mg/dL (ref 8.9–10.3)
Chloride: 98 mmol/L (ref 98–111)
Creatinine, Ser: 10.17 mg/dL — ABNORMAL HIGH (ref 0.44–1.00)
GFR, Estimated: 3 mL/min — ABNORMAL LOW (ref >60)
Glucose, Bld: 130 mg/dL — ABNORMAL HIGH (ref 70–99)
Phosphorus: 6.2 mg/dL — ABNORMAL HIGH (ref 2.5–4.6)
Potassium: 3.3 mmol/L — ABNORMAL LOW (ref 3.5–5.1)
Sodium: 137 mmol/L (ref 135–145)

## 2021-09-01 LAB — RPR: RPR Ser Ql: NONREACTIVE

## 2021-09-01 MED ORDER — POTASSIUM CHLORIDE 10 MEQ/100ML IV SOLN
10.0000 meq | INTRAVENOUS | Status: AC
  Administered 2021-09-01 (×2): 10 meq via INTRAVENOUS
  Filled 2021-09-01 (×2): qty 100

## 2021-09-01 MED ORDER — POTASSIUM CHLORIDE CRYS ER 20 MEQ PO TBCR: 40.0000 meq | EXTENDED_RELEASE_TABLET | Freq: Once | ORAL | Status: DC

## 2021-09-01 MED ORDER — LABETALOL HCL 5 MG/ML IV SOLN
10.0000 mg | Freq: Four times a day (QID) | INTRAVENOUS | Status: DC | PRN
  Administered 2021-09-01 – 2021-09-02 (×3): 10 mg via INTRAVENOUS
  Filled 2021-09-01 (×4): qty 4

## 2021-09-01 NOTE — TOC Progression Note (Addendum)
Transition of Care [facility]) - Progression Note    Patient Details  Name: Felicia Williams MRN: [id number] Date of Birth: October 22, 1940  Transition of Care [facility]) CM/SW Contact  Felicia Mayo, RN Phone Number: 09/01/2021, 2:09 PM  Clinical Narrative:    Felicia Williams daughter lives with patient,  she will have transport at discharge.  She has a cane at home and the dialysis equipment.  NCM offered choice for HHPT, she does not have a preference.  She is ok with [employer] supplying the bedside commode. NCM made referral to [facility] with [place] call back to see if she can take referral.    2/9- Felicia Williams with [facility] states they can take patient as referral for [facility] [place].  Will need orders at discharge.   2/10- BSC in patient's room.  Conts on EEG monitoring, will be here til next week per MD, speech following.      Expected Discharge Plan: [facility] Barriers to Discharge: Continued Medical Work up  Expected Discharge Plan and Services Expected Discharge Plan: [place]   Discharge Planning Services: CM Consult Post Acute Care Choice: [place] arrangements for the past 2 months: Single Family Home                 DME Arranged: Bedside commode DME Agency: [employer] Date DME Agency Contacted: 08/31/21 Time DME Agency Contacted: 83 Representative spoke with at DME Agency: [place]: RN, PT, Disease Management [place] Agency:  ([place]) Date [place]: 08/31/21 Time [place]: 1601 Representative spoke with at [place]: [place] ([place]) Interventions    Readmission Risk Interventions Readmission Risk Prevention Plan 01/20/2019  Transportation Screening Complete  PCP or Specialist Appt within 3-5 Days Complete  HRI or [place] Complete  Social Work Consult for [place] Planning/Counseling [place] Not Applicable   Medication Review Press photographer) Complete  Some recent data might be hidden

## 2021-09-01 NOTE — Progress Notes (Signed)
LTM discontinued at bedside. No skin breakdown noted. Results pending.  °

## 2021-09-01 NOTE — Progress Notes (Signed)
Neurology Progress Note  Brief HPI: Felicia Williams is a 81 y.o. female with a history of ESRD on PD, HTN, HLD, spinal stenosis, and GERD who presented to the ED at [facility] 2/4 with severe abdominal pain, nausea and dry heaving. She was found to have leukocytosis, lactic acid elevation. CT abd/pelvis revealed no acute intraabdominal findings. IV antibiotics were started for suspicion of bacterial peritonitis and the patient was admitted to [facility]. Peritoneal fluid appeared milky with neutrophil count 8,667 and GPC on gram stain.  Peritoneal fluid cultures remain negative to date.  Treating for PD associated peritonitis.  Acute metabolic encephalopathy, suspected due to cefepime and peritonitis.  Subjective: No family at bedside. Patient alert and tracking but unable to follow commands. Patient will persistently respond with "yes" regardless of question or command. Patient appears less drowsy than yesterday.  Exam: Vitals:   09/01/21 0500 09/01/21 0841  BP: (!) 159/65 (!) 153/95  Pulse:  83  Resp: 13 16  Temp:  98.4 F (36.9 C)  SpO2:  97%   Gen: In bed, more awake and alert than yesterday Resp: non-labored breathing, no acute distress Abd: soft, nt  Neuro: Mental Status: Patient is awake, alert, occasionally will say "yeah".  Cranial Nerves: II: Blinks to threat bilaterally. Pupils are equal, round, and reactive to light.   III,IV, VI: EOMI without ptosis or diploplia.  V: Facial sensation is symmetric to temperature VII: Facial movement is symmetric.  Motor: Tone is decreased, she does not cooperate with formal strength testing, does not follow commands.  She appears to have a multifocal myoclonus. Sensory: She response to noxious stimulation all four extremities  Deep Tendon Reflexes: 2+ and symmetric in the biceps and patellae.  3+ in bilateral ankles with nonsustained clonus Plantars: Toes are downgoing bilaterally.  Cerebellar: She does not perform  Pertinent Labs: CBC Latest  Ref Rng & Units 09/01/2021 08/31/2021 08/30/2021  WBC 4.0 - 10.5 K/uL 9.6 8.6 6.9  Hemoglobin 12.0 - 15.0 g/dL 8.8(L) 8.5(L) 7.3(L)  Hematocrit 36.0 - 46.0 % 25.0(L) 24.2(L) 21.3(L)  Platelets 150 - 400 K/uL 296 266 240   CMP Latest Ref Rng & Units 09/01/2021 08/31/2021 08/30/2021  Glucose 70 - 99 mg/dL 130(H) 136(H) 157(H)  BUN 8 - 23 mg/dL 60(H) 66(H) 62(H)  Creatinine 0.44 - 1.00 mg/dL 10.17(H) 9.72(H) 9.29(H)  Sodium 135 - 145 mmol/L 137 136 133(L)  Potassium 3.5 - 5.1 mmol/L 3.3(L) 3.2(L) 2.9(L)  Chloride 98 - 111 mmol/L 98 97(L) 93(L)  CO2 22 - 32 mmol/L 24 24 24   Calcium 8.9 - 10.3 mg/dL 9.3 8.9 8.3(L)  Total Protein 6.5 - 8.1 g/dL - - -  Total Bilirubin 0.3 - 1.2 mg/dL - - -  Alkaline Phos 38 - 126 U/L - - -  AST 15 - 41 U/L - - -  ALT 0 - 44 U/L - - -    Imaging Reviewed:   Assessment: Felicia Williams is a 81 y.o. female with a history of ESRD on PD, HTN, HLD, spinal stenosis, and GERD who presented to the ED at [facility] 2/4 with severe abdominal pain, nausea and dry heaving. Neuro was consulted after patient had 2 days of worsening mental status.  LTM EEG showed generalized periodic discharges with triphasic morphology and generalized irregular slow activity.  Impression: Toxic Metabolic Encephalopathy due to cefepime neurotoxicity and peritonitis. Patient appears to be slowly improving in mental status. LTM EEG indicative of metabolic encephalopathy rather than seizure  Recommendations: 1) Continue LTM 2) Neurology will  continue to follow  France Ravens, MD PGY1 Resident

## 2021-09-01 NOTE — Progress Notes (Signed)
Physical Therapy Treatment Patient Details Name: Felicia Williams MRN: [id number] DOB: 06-25-1941 Today's Date: 09/01/2021   History of Present Illness Pt is a 81 y.o. F who presents 08/26/2021 with severe abdominal pain, nausea, dry heaving. Found to have leukocytosis and lactic acid elevation. CT abd/pelvis revealed no acute intraabdominal findings. IV antibiotics started for suspicion of bacterial peritonitis. Significant PMH: ESRD on PD, HTN, HLD, spinal stenosis.    PT Comments    Pt more alert upon arrival of PT this afternoon. She remains limited in communication at this time, able to mimic repeated phrases and give inconsistent answers to simple questions (correct answer < 50% of the time with 5-8 seconds processing time). She was able to tolerate sitting EOB with min-modA to maintain seated balance and completed seated exercise with assist. The pt was unable to answer any questions regarding pain or dizziness, VSS through session. Will continue to benefit from skilled PT acutely as well as OT evaluation to address safe progression of mobility with current cognitive deficits. The pt has had a significant regression in mobility since initial evaluation, therefore d/c recommendations updated at this time, will continue to assess with pt progress.     Recommendations for follow up therapy are one component of a multi-disciplinary discharge planning process, led by the attending physician.  Recommendations may be updated based on patient status, additional functional criteria and insurance authorization.  Follow Up Recommendations  Acute inpatient rehab (3hours/day)     Assistance Recommended at Discharge Frequent or constant Supervision/Assistance  Patient can return home with the following Two people to help with walking and/or transfers;Two people to help with bathing/dressing/bathroom;Assistance with feeding;Direct supervision/assist for financial management;Direct supervision/assist for  medications management;Help with stairs or ramp for entrance;Assist for transportation   Equipment Recommendations   (defer to post acute)    Recommendations for Other Services Rehab consult;OT consult     Precautions / Restrictions Precautions Precautions: Fall Precaution Comments: tremors/jerking movements, monitor BP Restrictions Weight Bearing Restrictions: No     Mobility  Bed Mobility Overal bed mobility: Needs Assistance Bed Mobility: Supine to Sit, Sit to Supine     Supine to sit: Mod assist, HOB elevated Sit to supine: Mod assist   General bed mobility comments: pt able to assist with some LE movement, does use LE to scoot towards EOB. pt attempting to assist wtih LE movement back into bed, minA to complete    Transfers                   General transfer comment: deferred due to poor stability sitting EOB, focus on seated balance exercises        Balance Overall balance assessment: Needs assistance Sitting-balance support: Feet supported, Bilateral upper extremity supported Sitting balance-Leahy Scale: Poor Sitting balance - Comments: mod-maxA to maintain static sitting EOB, pt able to make some corrections with increased time Postural control: Left lateral lean                                  Cognition Arousal/Alertness: Awake/alert Behavior During Therapy: Flat affect Overall Cognitive Status: Difficult to assess                                 General Comments: pt alert but unable to consistently answer yes/no questions. does require increased processing time and answered <50% of simple questions ("is  that your daughter?") during the session        Exercises Other Exercises Other Exercises: seated shoulder flexion with hand-over hand assist x 10 each side Other Exercises: passive knee extension sitting EOB x 10 Other Exercises: lateral lean to propped sidelying on elbow. x10 each direction with pt able to  initiate push back to sitting. minA to steady in sitting    General Comments General comments (skin integrity, edema, etc.): BP elevated but stable and similar to other readings from the day      Pertinent Vitals/Pain Pain Assessment Pain Assessment: Faces Faces Pain Scale: No hurt Pain Intervention(s): Monitored during session     PT Goals (current goals can now be found in the care plan section) Acute Rehab PT Goals Patient Stated Goal: to feel better, less tremors PT Goal Formulation: With patient Time For Goal Achievement: 09/11/21 Potential to Achieve Goals: Good Progress towards PT goals: Progressing toward goals    Frequency    Min 3X/week      PT Plan Discharge plan needs to be updated       AM-PAC PT "6 Clicks" Mobility   Outcome Measure  Help needed turning from your back to your side while in a flat bed without using bedrails?: Total Help needed moving from lying on your back to sitting on the side of a flat bed without using bedrails?: Total Help needed moving to and from a bed to a chair (including a wheelchair)?: Total Help needed standing up from a chair using your arms (e.g., wheelchair or bedside chair)?: Total Help needed to walk in hospital room?: Total Help needed climbing 3-5 steps with a railing? : Total 6 Click Score: 6    End of Session Equipment Utilized During Treatment: Gait belt Activity Tolerance: Patient tolerated treatment well Patient left: in bed;with call bell/phone within reach;with bed alarm set Nurse Communication: Mobility status PT Visit Diagnosis: Unsteadiness on feet (R26.81);Difficulty in walking, not elsewhere classified (R26.2);Other abnormalities of gait and mobility (R26.89);Dizziness and giddiness (R42)     Time: 4097-3532 PT Time Calculation (min) (ACUTE ONLY): 32 min  Charges:  $Therapeutic Exercise: 8-22 mins $Therapeutic Activity: 8-22 mins                     [place], PT, DPT   Acute Rehabilitation  Department Pager #: [phone]   Sandra Cockayne 09/01/2021, 5:20 PM

## 2021-09-01 NOTE — Procedures (Signed)
EEG Procedure CPT/Type of Study: 75916; 24hr EEG with video Referring Provider: [facility] Primary Neurological Diagnosis: encephalopathy  History: This is a 81 yr old patient, undergoing an EEG to evaluate for delirium, abnl EEG. Clinical State: obtunded  Technical Description:  The EEG was performed using standard setting per the guidelines of American Clinical Neurophysiology Society (ACNS).  A minimum of 21 electrodes were placed on scalp according to the International 10-20 or/and 10-10 Systems. Supplemental electrodes were placed as needed. Single EKG electrode was also used to detect cardiac arrhythmia. Patient's behavior was continuously recorded on video simultaneously with EEG. A minimum of 16 channels were used for data display. Each epoch of study was reviewed manually daily and as needed using standard referential and bipolar montages. Computerized quantitative EEG analysis (such as compressed spectral array analysis, trending, automated spike & seizure detection) were used as indicated.   Day 1: from 1535 08/31/21 to 0730 09/01/21  EEG Description: Overall Amplitude: High Predominant Frequency: The background activity showed theta, with about 4-5 Hz, that was frequent. Superimposed Frequencies: sparse beta activity bilaterally The background was symmetric  Background Abnormalities: Generalized slowing; theta and some delta Rhythmic or periodic pattern: Generalized periodic discharges (GPD): frequent long runs of 1-2Hz  spiky GPDs, triphasic-appearing at times. These were increased with stimulation.  Epileptiform activity: Yes: spiky morphology to GPD Electrographic seizures: no; no discrete seizures Events: no   Breach rhythm: no  Reactivity: Present  Stimulation procedures:  Hyperventilation: not done Photic stimulation: no change  Sleep Background: Stage II  EKG:no significant arrhythmia  Impression: This was an abnormal continuous video EEG due to frequent runs of  stimulus-induced sharp GPDs, indicative of a severe toxic/metabolic encephalopathy pattern. These discharges were not appreciably improved with treatment with benzodiazepines. This appeared more consistent with an encephalopathy pattern as opposed to a seizure pattern.

## 2021-09-01 NOTE — Progress Notes (Signed)
Pharmacy Antibiotic Note  Patsy Zaragoza is a 81 y.o. female admitted on 08/26/2021 with bacteremial peritonitis.  Pharmacy has been consulted for Vancomycin dosing. Note patient is on daily PD.   Last dose vancomycin 2/8. Continues on daily PD. Will check level 2/11, anticipate giving a dose every 3 -4 days. Cefepime was switched to cefazolin for encephalopathy.  Plan: Give vancomycin 1000 mg on 2/12 Cefazolin 1 gram IV Q 24 hours per team  Monitor cultures, clinical status, PD tolerance, vancomycin level 2/11  Narrow abx as able and to intraperitoneal at discharge  Planning 2 weeks total treatment    Height: 4\' 11"  (149.9 cm) Weight: 55.8 kg (123 lb 0.3 oz) IBW/kg (Calculated) : 43.2  Temp (24hrs), Avg:98.2 F (36.8 C), Min:97.7 F (36.5 C), Max:98.7 F (37.1 C)  Recent Labs  Lab 08/26/21 1814 08/26/21 1920 08/27/21 0120 08/27/21 0344 08/28/21 0142 08/28/21 0804 08/29/21 0353 08/30/21 0217 08/31/21 0108 09/01/21 0326  WBC 14.8*  --  21.2*  --   --  10.5 8.7 6.9 8.6 9.6  CREATININE 10.43*  --  10.38*  --   --  9.78* 9.51* 9.29* 9.72* 10.17*  LATICACIDVEN 3.0* 2.6* 1.7 1.2  --   --   --   --   --   --   VANCORANDOM  --   --   --   --  22  --  20  --   --   --      Estimated Creatinine Clearance: 3.3 mL/min (A) (by C-G formula based on SCr of 10.17 mg/dL (H)).    Allergies  Allergen Reactions   Cefepime Other (See Comments)    Feb 2023 Encephalopathy with questionable seizures. Seems to be tolerating cefazolin    Sulfa Antibiotics Other (See Comments)    Shut pt's kidneys down    Antimicrobials: CTX 2/4 Vanc 2/4>>  Cefe 2/5 >>2/8 Cefazolin 2/9>>   Microbiology  2/4 BCx: ngtd 2/4 PD fluid: GPC on gram stain, NGTD  2/5 MRSA neg    Thank you for allowing pharmacy to be a part of this patients care.  Benetta Spar, PharmD, BCPS, BCCP Clinical Pharmacist  Please check AMION for all [place] phone numbers After 10:00 PM, call [place] [phone]

## 2021-09-01 NOTE — Progress Notes (Signed)
PROGRESS NOTE   Felicia Williams  GMW:[id number]    DOB: 06-Jun-1941    DOA: 08/26/2021  PCP: [name], NP   I have briefly reviewed patients previous medical records in [facility].  Chief Complaint  Patient presents with   Abdominal Pain    Hospital Course:  Felicia Williams is an 81 y.o. female with a history of ESRD on PD, HTN, HLD, spinal stenosis, and GERD who presented to the ED at [facility] 2/4 with severe abdominal pain, nausea and dry heaving. She was found to have leukocytosis, lactic acid elevation. CT abd/pelvis revealed no acute intraabdominal findings. IV antibiotics were started for suspicion of bacterial peritonitis and the patient was admitted to [facility]. Peritoneal fluid appeared milky with neutrophil count 8,667 and GPC on gram stain.  Peritoneal fluid cultures remain negative to date.  Treating for PD associated peritonitis.  Acute metabolic encephalopathy, suspected due to cefepime and peritonitis, cefepime discontinued.  Nephrology on board.  AMS worse 2/9, CT head without acute abnormalities, neurology consulted, having myoclonic jerks, LTM EEG suggestive of toxic metabolic encephalopathy.  Mental status slightly better.   Assessment & Plan:  Principal Problem:   Peritonitis associated with peritoneal dialysis [facility]) Active Problems:   Acute metabolic encephalopathy   Hypokalemia   Macrocytic anemia   Essential hypertension, benign   End stage renal disease (HCC)   Gastroesophageal reflux disease   N&V (nausea and vomiting)   Sepsis (HCC)   Protein-calorie malnutrition, moderate (HCC)   Pruritus   Shortness of breath   Assessment and Plan: * Peritonitis associated with peritoneal dialysis ([place])- (present on admission) Sepsis due to peritonitis, POA. Peritoneal dialysis-associated bacterial peritonitis -Treated with broad antibiotic coverage pending blood culture (negative, final report) peritoneal fluid culture NGTD (sent 2/4). Gram stain +GPC and milky  peritoneal fluid.  - Continuing vancomycin.  Cefepime discontinued due to concern for AMS then switched over to cefazolin on 2/8.  As per nephrology, both of these can be given across dialysis.  Acute metabolic encephalopathy Suspected multifactorial due to cefepime (primary etiology), sepsis from peritonitis, hospital delirium. Delirium precautions.  Minimize sedatives and opioids-all discontinued 2/9. Cefepime discontinued.  Last dose was on 2/7. Myoclonic jerks noted by family and neurology on 2/9 CT head without acute abnormalities.  Ammonia level: 32. Suspected delayed clearance of cefepime due to ESRD. Per nephrology, unlikely from ESRD/uremia. Neurology input appreciated, LTM EEG indicative of severe toxic/metabolic encephalopathy as opposed to a seizure pattern. B12: 827, TSH 0.865 and RPR pending. Mental status slightly improved compared to yesterday.  Continue to monitor closely. SLP consulted due to reported?  Aspiration episode on 2/9.  Oral intake when safe to do so.  Hypokalemia Did not receive oral potassium yesterday due to AMS. Replacing gently IV.  Follow BMP.  Macrocytic anemia- (present on admission) Hemoglobin stable in the low 7 g range. Follow CBC closely and transfuse if hemoglobin 7 g or less. Hemoglobin in the 8.5- 8.8 range over the last 2 days. B12 normal, ferritin 1,857 and folate 45.6.  Essential hypertension, benign- (present on admission) Continue coreg, continue to hold hydralazine Controlled.  End stage renal disease ([place])- (present on admission) Nephrology consulted for routine PD. Discussed with Dr. Royce Macadamia 2/8, 2/9 at bedside.  Gastroesophageal reflux disease- (present on admission) Continue PPI  Shortness of breath Negative acute CXR. Atelectasis likely contributing. No hypoxia or chest pain. At risk of reflux and on PPI.  No dyspnea reported and is not hypoxic.  Pruritus- (present on admission)  Moisturizing lotion ordered. TBili wnl, BUN  not severely/acutely elevated.   Protein-calorie malnutrition, moderate ([place])- (present on admission) Supplement protein as able. Hypoalbuminemia noted at 2.2.   Sepsis ([place])- (present on admission) Tachycardia, leukocytosis, lactic acidosis with a suspected source of peritonitis.  Management as noted above Sepsis features have resolved.  N&V (nausea and vomiting)- (present on admission) Due to peritonitis Antiemetic prn Seems to have resolved.  However has significantly diminished appetite.  Dietitian consulted yesterday and has liberalized diet choices.   Body mass index is 24.85 kg/m.  Nutritional Status Nutrition Problem: Increased nutrient needs Etiology: acute illness Signs/Symptoms: estimated needs Interventions: MVI, Ensure Enlive (each supplement provides 350kcal and 20 grams of protein), Prostat, Liberalize Diet  Pressure Ulcer:     DVT prophylaxis: heparin injection 5,000 Units Start: 08/27/21 0600     Code Status: Full Code:  Family Communication: None at bedside this morning. Disposition:  Status is: Inpatient Remains inpatient appropriate because: Severity of illness     Consultants:   Nephrology Neurology  Procedures:   Peritoneal dialysis LTM EEG  Antimicrobials:   As noted above   Subjective:  Patient seen in room with nursing.  No family at bedside.  Alert and tracking activity around with her eyes.  When I greeted her with a "good morning", she responded with a "good morning" but then for subsequent all questions she kept repeating "good morning".  Not following instructions.  Did respond with "yeah" to some of the nursing questions.  Objective:   Vitals:   09/01/21 0346 09/01/21 0400 09/01/21 0500 09/01/21 0841  BP: (!) 181/70 (!) 173/65 (!) 159/65 (!) 153/95  Pulse:  81  83  Resp:  15 13 16   Temp: 98.2 F (36.8 C)   98.4 F (36.9 C)  TempSrc: Oral   Oral  SpO2:  96%  97%  Weight: 55.8 kg     Height:        General exam: Elderly  female, moderately built and nourished lying comfortably propped up in bed without distress.  Looks improved compared to yesterday. Respiratory system: Clear to auscultation.  No increased work of breathing. Cardiovascular system: S1 and S2 heard, RRR.  No JVD, murmurs or pedal edema. Gastrointestinal system: Abdomen is nondistended, soft, nontender.  PD catheter site without acute findings.  Normal bowel sounds heard. Central nervous system: Alert, tracking activities around with her eyes, not oriented, not following instructions, minimal verbal response as noted above.  No focal neurological deficits.  Purposeful activity of all limbs. Extremities: Symmetric 5 x 5 power. Skin: No rashes, lesions or ulcers Psychiatry: Judgement and insight are impaired. Mood & affect flat.     Data Reviewed:   I have personally reviewed following labs and imaging studies   CBC: Recent Labs  Lab 08/26/21 1814 08/27/21 0120 08/28/21 0804 08/30/21 0217 08/31/21 0108 09/01/21 0326  WBC 14.8* 21.2*   < > 6.9 8.6 9.6  NEUTROABS 13.7* 18.9*  --   --   --   --   HGB 9.7* 7.7*   < > 7.3* 8.5* 8.8*  HCT 27.4* 21.6*   < > 21.3* 24.2* 25.0*  MCV 103.4* 103.3*   < > 102.4* 103.4* 102.9*  PLT 242 224   < > 240 266 296   < > = values in this interval not displayed.    Basic Metabolic Panel: Recent Labs  Lab 08/27/21 0120 08/28/21 0804 08/29/21 0353 08/30/21 0217 08/31/21 0108 09/01/21 0326  NA 136 133* 133* 133*  136 137  K 4.2 3.3* 3.5 2.9* 3.2* 3.3*  CL 99 93* 95* 93* 97* 98  CO2 20* 22 23 24 24 24   GLUCOSE 107* 146* 121* 157* 136* 130*  BUN 72* 66* 69* 62* 66* 60*  CREATININE 10.38* 9.78* 9.51* 9.29* 9.72* 10.17*  CALCIUM 7.9* 8.1* 8.3* 8.3* 8.9 9.3  MG 2.0  --   --   --   --   --   PHOS 7.9* 7.3* 6.6* 5.8* 5.4* 6.2*    Liver Function Tests: Recent Labs  Lab 08/26/21 1814 08/27/21 0120 08/28/21 0804 08/29/21 0353 08/30/21 0217 08/31/21 0108 09/01/21 0326  AST 24 17  --   --   --    --   --   ALT 27 23  --   --   --   --   --   ALKPHOS 71 62  --   --   --   --   --   BILITOT 0.4 0.3  --   --   --   --   --   PROT 6.5 5.9*  --   --   --   --   --   ALBUMIN 2.9* 2.5* 2.2* 2.4* 2.0* 2.0* 2.1*    CBG: Recent Labs  Lab 08/27/21 1058 08/27/21 1516  GLUCAP 95 97    Microbiology Studies:   Recent Results (from the past 240 hour(s))  Blood culture (routine x 2)     Status: None   Collection Time: 08/26/21  8:39 PM   Specimen: Left Antecubital; Blood  Result Value Ref Range Status   Specimen Description LEFT ANTECUBITAL  Final   Special Requests   Final    BOTTLES DRAWN AEROBIC AND ANAEROBIC Blood Culture results may not be optimal due to an excessive volume of blood received in culture bottles   Culture   Final    NO GROWTH 5 DAYS Performed at [facility], [address], [place], [place] [address]    Report Status 08/31/2021 FINAL  Final  Blood culture (routine x 2)     Status: None   Collection Time: 08/26/21  8:46 PM   Specimen: BLOOD LEFT HAND  Result Value Ref Range Status   Specimen Description BLOOD LEFT HAND  Final   Special Requests   Final    BOTTLES DRAWN AEROBIC AND ANAEROBIC Blood Culture results may not be optimal due to an excessive volume of blood received in culture bottles   Culture   Final    NO GROWTH 5 DAYS Performed at [facility], [address], [place], [place] [address]    Report Status 08/31/2021 FINAL  Final  Gram stain     Status: None   Collection Time: 08/26/21  9:12 PM   Specimen: Peritoneal Washings  Result Value Ref Range Status   Specimen Description PERITONEAL FLUID  Final   Special Requests NONE  Final   Gram Stain   Final    WBC PRESENT, PREDOMINANTLY PMN GRAM POSITIVE COCCI CYTOSPIN SMEAR Performed at [facility], [address], [place], [place] [address]    Report Status 08/27/2021 FINAL  Final  Culture, body fluid w Gram Stain-bottle     Status: None (Preliminary result)   Collection Time: 08/26/21   9:12 PM   Specimen: Peritoneal Washings  Result Value Ref Range Status   Specimen Description PERITONEAL FLUID  Final   Special Requests NONE  Final   Culture   Final    NO  GROWTH 4 DAYS Performed at [facility], [place] [address], [place], [place] [address]    Report Status PENDING  Incomplete  Resp Panel by RT-PCR (Flu A&B, Covid) Nasopharyngeal Swab     Status: None   Collection Time: 08/26/21  9:14 PM   Specimen: Nasopharyngeal Swab; Nasopharyngeal(NP) swabs in vial transport medium  Result Value Ref Range Status   SARS Coronavirus 2 by RT PCR NEGATIVE NEGATIVE Final    Comment: (NOTE) SARS-CoV-2 target nucleic acids are NOT DETECTED.  The SARS-CoV-2 RNA is generally detectable in upper respiratory specimens during the acute phase of infection. The lowest concentration of SARS-CoV-2 viral copies this assay can detect is 138 copies/mL. A negative result does not preclude SARS-Cov-2 infection and should not be used as the sole basis for treatment or other patient management decisions. A negative result may occur with  improper specimen collection/handling, submission of specimen other than nasopharyngeal swab, presence of viral mutation(s) within the areas targeted by this assay, and inadequate number of viral copies(<138 copies/mL). A negative result must be combined with clinical observations, patient history, and epidemiological information. The expected result is Negative.  Fact Sheet for Patients:  EntrepreneurPulse.com.au  Fact Sheet for Healthcare Providers:  IncredibleEmployment.be  This test is no t yet approved or cleared by the [place] FDA and  has been authorized for detection and/or diagnosis of SARS-CoV-2 by FDA under an Emergency Use Authorization (EUA). This EUA will remain  in effect (meaning this test can be used) for the duration of the COVID-19 declaration under Section 564(b)(1) of the Act, 21 U.S.C.section  360bbb-3(b)(1), unless the authorization is terminated  or revoked sooner.       Influenza A by PCR NEGATIVE NEGATIVE Final   Influenza B by PCR NEGATIVE NEGATIVE Final    Comment: (NOTE) The Xpert Xpress SARS-CoV-2/FLU/RSV plus assay is intended as an aid in the diagnosis of influenza from Nasopharyngeal swab specimens and should not be used as a sole basis for treatment. Nasal washings and aspirates are unacceptable for Xpert Xpress SARS-CoV-2/FLU/RSV testing.  Fact Sheet for Patients: EntrepreneurPulse.com.au  Fact Sheet for Healthcare Providers: IncredibleEmployment.be  This test is not yet approved or cleared by the [place] FDA and has been authorized for detection and/or diagnosis of SARS-CoV-2 by FDA under an Emergency Use Authorization (EUA). This EUA will remain in effect (meaning this test can be used) for the duration of the COVID-19 declaration under Section 564(b)(1) of the Act, 21 U.S.C. section 360bbb-3(b)(1), unless the authorization is terminated or revoked.  Performed at [facility], [address], [place], [place] [address]   MRSA Next Gen by PCR, Nasal     Status: None   Collection Time: 08/27/21 10:37 AM   Specimen: Nasal Mucosa; Nasal Swab  Result Value Ref Range Status   MRSA by PCR Next Gen NOT DETECTED NOT DETECTED Final    Comment: (NOTE) The GeneXpert MRSA Assay (FDA approved for NASAL specimens only), is one component of a comprehensive MRSA colonization surveillance program. It is not intended to diagnose MRSA infection nor to guide or monitor treatment for MRSA infections. Test performance is not FDA approved in patients less than 54 years old. Performed at [facility], [place] [address], [place], [place] [address]     Radiology Studies:  CT HEAD WO CONTRAST (5MM)  Result Date: 08/31/2021 CLINICAL DATA:  Altered mental status. EXAM: CT HEAD WITHOUT CONTRAST TECHNIQUE: Contiguous axial images  were obtained from the base of the  skull through the vertex without intravenous contrast. RADIATION DOSE REDUCTION: This exam was performed according to the departmental dose-optimization program which includes automated exposure control, adjustment of the mA and/or kV according to patient size and/or use of iterative reconstruction technique. COMPARISON:  01/16/2021 FINDINGS: Brain: Stable mildly enlarged ventricles and cortical sulci. No intracranial hemorrhage, mass lesion or CT evidence of acute infarction. Vascular: No hyperdense vessel or unexpected calcification. Skull: Normal. Negative for fracture or focal lesion. Sinuses/Orbits: Stable mucosal thickening or retention cyst in the sphenoid sinus on the left, containing calcification. Mild inferior left maxillary sinus mucosal thickening. Right inferior maxillary sinus retention cyst. Status post bilateral cataract extraction. Other: None. IMPRESSION: 1. No acute abnormality. 2. Stable mild diffuse cerebral and cerebellar atrophy. 3. Chronic sinusitis. Electronically Signed   By: Claudie Revering M.D.   On: 08/31/2021 11:43   EEG adult  Result Date: 08/31/2021 Greta Doom, MD     08/31/2021  8:06 PM History: 81 yo F with encephalopathy Sedation: None Technique: This EEG was acquired with electrodes placed according to the International 10-20 electrode system (including Fp1, Fp2, F3, F4, C3, C4, P3, P4, O1, O2, T3, T4, T5, T6, A1, A2, Fz, Cz, Pz). The following electrodes were missing or displaced: none. Background: The background consists of periodic high voltage triphasic morphology discharges with a shifting bifrontal predominance with a frequency of 1.5 - 2 Hz. There is no clear evolution. Folllowing ativan administration, there was some improvement in the EEG as the patient became more drowsy, but they returned following stimulation. Photic stimulation: Physiologic driving is not performed EEG Abnormalities: 1) Generalized periodic  discharges(GPDs) with triphasic morphology 2) generalized irregular slow activity. Clinical Interpretation: This markedly abnormal EEG recorded a pattern that is on the ictal-interictal continuum. It is associated with encephalopathy, but can also be associated with cortical irritability and seizure. The fluctuation with drowsiness would favor an encephalopathy etiology. Further characterization with LTM EEG is recommended. Roland Rack, MD [facility] [phone] If 7pm- 7am, please page neurology on call as listed in [place].   Overnight EEG with video  Result Date: 09/01/2021 Samuella Cota, MD     09/01/2021  8:11 AM EEG Procedure CPT/Type of Study: 50354; 24hr EEG with video Referring Provider: [facility] Primary Neurological Diagnosis: encephalopathy History: This is a 81 yr old patient, undergoing an EEG to evaluate for delirium, abnl EEG. Clinical State: obtunded Technical Description: The EEG was performed using standard setting per the guidelines of American Clinical Neurophysiology Society (ACNS). A minimum of 21 electrodes were placed on scalp according to the International 10-20 or/and 10-10 Systems. Supplemental electrodes were placed as needed. Single EKG electrode was also used to detect cardiac arrhythmia. Patient's behavior was continuously recorded on video simultaneously with EEG. A minimum of 16 channels were used for data display. Each epoch of study was reviewed manually daily and as needed using standard referential and bipolar montages. Computerized quantitative EEG analysis (such as compressed spectral array analysis, trending, automated spike & seizure detection) were used as indicated. Day 1: from 1535 08/31/21 to 0730 09/01/21 EEG Description: Overall Amplitude: High Predominant Frequency: The background activity showed theta, with about 4-5 Hz, that was frequent. Superimposed Frequencies: sparse beta activity bilaterally The background was symmetric Background  Abnormalities: Generalized slowing; theta and some delta Rhythmic or periodic pattern: Generalized periodic discharges (GPD): frequent long runs of 1-2Hz  spiky GPDs, triphasic-appearing at times. These were increased with stimulation. Epileptiform activity: Yes: spiky morphology to GPD Electrographic seizures: no; no discrete  seizures Events: no Breach rhythm: no Reactivity: Present Stimulation procedures: Hyperventilation: not done Photic stimulation: no change Sleep Background: Stage II EKG:no significant arrhythmia Impression: This was an abnormal continuous video EEG due to frequent runs of stimulus-induced sharp GPDs, indicative of a severe toxic/metabolic encephalopathy pattern. These discharges were not appreciably improved with treatment with benzodiazepines. This appeared more consistent with an encephalopathy pattern as opposed to a seizure pattern.    Scheduled Meds:    (feeding supplement) PROSource Plus  30 mL Oral BID BM   carvedilol  12.5 mg Oral Daily   famotidine  20 mg Oral QHS   feeding supplement  237 mL Oral BID BM   gentamicin cream  1 application Topical Daily   gentamicin cream  1 application Topical Daily   heparin  5,000 Units Subcutaneous Q8H   multivitamin  1 tablet Oral QHS   pantoprazole  20 mg Oral Daily   sevelamer carbonate  2.4 g Oral TID WC    Continuous Infusions:    sodium chloride Stopped (08/28/21 2307)    ceFAZolin (ANCEF) IV 1 g (08/31/21 2154)   dialysis solution 2.5% low-MG/low-CA     potassium chloride       LOS: 6 days     Vernell Leep, MD,  FACP, [facility], [facility], [facility] (Care Management Physician Certified) [place]  To contact the attending provider between 7A-7P or the covering provider during after hours 7P-7A, please log into the web site [email or web address] and access using universal [place] password for that web site. If you do not have the password, please call the hospital operator.  09/01/2021,  9:15 AM

## 2021-09-01 NOTE — Plan of Care (Signed)

## 2021-09-01 NOTE — Evaluation (Signed)
Clinical/Bedside Swallow Evaluation Patient Details  Name: Felicia Williams MRN: [id number] Date of Birth: 02-Aug-1940  Today's Date: 09/01/2021 Time: SLP Start Time (ACUTE ONLY): 1245 SLP Stop Time (ACUTE ONLY): 1321 SLP Time Calculation (min) (ACUTE ONLY): 36 min  Past Medical History:  Past Medical History:  Diagnosis Date   Anemia in stage 4 chronic kidney disease ([place]) 08/18/2018   Depression    Essential hypertension, benign    GERD (gastroesophageal reflux disease)    Glomerulonephritis    Gout    Mixed hyperlipidemia    Renal insufficiency    Past Surgical History:  Past Surgical History:  Procedure Laterality Date   APPENDECTOMY     CATARACT EXTRACTION     COLONOSCOPY N/A 12/28/2015   Procedure: COLONOSCOPY;  Surgeon: Rogene Houston, MD;  Location: [facility];  Service: Endoscopy;  Laterality: N/A;  815   ESOPHAGOGASTRODUODENOSCOPY N/A 11/16/2020   Procedure: ESOPHAGOGASTRODUODENOSCOPY (EGD);  Surgeon: Rogene Houston, MD;  Location: [facility];  Service: Endoscopy;  Laterality: N/A;  1:15   IR FLUORO GUIDE CV LINE RIGHT  01/16/2019   IR REMOVAL TUN CV CATH W/O FL  07/01/2019   IR US GUIDE VASC ACCESS RIGHT  01/16/2019   POLYPECTOMY  11/16/2020   Procedure: POLYPECTOMY;  Surgeon: Rogene Houston, MD;  Location: [facility];  Service: Endoscopy;;  gastric   TUBAL LIGATION     HPI:  Pt is a 81 y.o. female who presented to the ED at [facility] 2/4 with severe abdominal pain, nausea and dry heaving. She was found to have leukocytosis, lactic acid elevation. Acute metabolic encephalopathy, suspected due to cefepime and peritonitis, cefepime discontinued.  AMS worse 2/9, CT head without acute abnormalities, neurology consulted, having myoclonic jerks, LTM EEG suggestive of toxic metabolic encephalopathy. Per MD order "Daughter reports patient appears to be choking when laying flat. Has had poor PO intake for past few days". BSE requested. PMH: ESRD on PD, HTN, HLD, spinal stenosis,  and GERD.    Assessment / Plan / Recommendation  Clinical Impression  Pt seen for swallow evaluation with son and daughter-in-law at bedside. They report pt consuming regular diet at baseline without difficultes. Limited oral mechanism examination completed given pt's reduced ability to follow commands and fluctuating alertness. No obvious asymmetry noted and lingual/labial ROM was [facility]. Thin liquids via cup and straw were significant for intermittent anterior spillage/sputtering, though do not suspect oral weakness, but rather impaired oral coordination and poor awareness of bolus given present mentation. No overt s/sx of aspiration observed and anterior spillage more consistently reduced with small controlled sips via cup. Bites of puree and regular textures from meal tray, present in room, were actively manipulated, masticated and orally cleared without delay or bolus hold. After multiple trials, pt became increasingly lethargic and POs were terminated. Recommend regular, thin liquid diet at this time with 1:1 supervision for all intake. Educated family and RN in regards to recommendations including swallow precautions (upright positioning, small bites/sips, reducing environmental distractions, etc). Ensure pt is alert for all intake as she may continue to fluctuate. Suspect swallow function will improve along with improvements in mentation. SLP to f/u for tolerance.  SLP Visit Diagnosis: Dysphagia, unspecified (R13.10)    Aspiration Risk  Mild aspiration risk;Moderate aspiration risk    Diet Recommendation Regular;Thin liquid   Liquid Administration via: Cup;Straw;Other (Comment) (cup preferable to decrease anterior spillage) Medication Administration: Whole meds with puree Supervision: Staff to assist with self feeding;Full supervision/cueing for compensatory strategies Compensations:  Minimize environmental distractions;Slow rate;Small sips/bites Postural Changes: Seated upright at 90  degrees;Remain upright for at least 30 minutes after po intake    Other  Recommendations Oral Care Recommendations: Oral care BID    Recommendations for follow up therapy are one component of a multi-disciplinary discharge planning process, led by the attending physician.  Recommendations may be updated based on patient status, additional functional criteria and insurance authorization.  Follow up Recommendations Other (comment) (TBD)      Assistance Recommended at Discharge Frequent or constant Supervision/Assistance  Functional Status Assessment Patient has had a recent decline in their functional status and demonstrates the ability to make significant improvements in function in a reasonable and predictable amount of time.  Frequency and Duration min 2x/week  2 weeks       Prognosis Prognosis for Safe Diet Advancement: Good Barriers to Reach Goals: Cognitive deficits      Swallow Study   General Date of Onset: 08/31/21 HPI: Pt is a 81 y.o. female who presented to the ED at [facility] 2/4 with severe abdominal pain, nausea and dry heaving. She was found to have leukocytosis, lactic acid elevation. Acute metabolic encephalopathy, suspected due to cefepime and peritonitis, cefepime discontinued.  AMS worse 2/9, CT head without acute abnormalities, neurology consulted, having myoclonic jerks, LTM EEG suggestive of toxic metabolic encephalopathy. Per MD order "Daughter reports patient appears to be choking when laying flat. Has had poor PO intake for past few days". BSE requested. PMH: ESRD on PD, HTN, HLD, spinal stenosis, and GERD. Type of Study: Bedside Swallow Evaluation Previous Swallow Assessment: none per EMR Diet Prior to this Study: Regular;Thin liquids;Other (Comment) (NPO per nursing) Temperature Spikes Noted: No Respiratory Status: Room air History of Recent Intubation: No Behavior/Cognition: Alert;Cooperative;Lethargic/Drowsy;Pleasant mood Oral Cavity Assessment: Within Functional  Limits Oral Cavity - Dentition: Adequate natural dentition Vision:  (difficult to assess) Self-Feeding Abilities: Total assist Patient Positioning: Upright in bed;Postural control adequate for testing Baseline Vocal Quality: Normal Volitional Cough: Weak Volitional Swallow: Unable to elicit    Oral/Motor/Sensory Function Overall Oral Motor/Sensory Function: Within functional limits Facial ROM: Within Functional Limits Facial Symmetry: Within Functional Limits Lingual ROM: Within Functional Limits   Ice Chips Ice chips: Within functional limits Presentation: Spoon   Thin Liquid Thin Liquid: Impaired Presentation: Cup;Straw;Spoon Oral Phase Impairments: Reduced labial seal;Reduced lingual movement/coordination;Poor awareness of bolus Oral Phase Functional Implications: Other (comment);Oral holding (anterior spillage)    Nectar Thick Nectar Thick Liquid: Not tested   Honey Thick Honey Thick Liquid: Not tested   Puree Puree: Within functional limits Presentation: Spoon   Solid     Solid: Within functional limits Presentation: [place], [place], [place] Office Number: [phone]  Acie Fredrickson 09/01/2021,1:49 PM

## 2021-09-01 NOTE — Progress Notes (Signed)
? ?  Inpatient Rehab Admissions Coordinator : ? ?Per therapy recommendations, patient was screened for CIR candidacy by Zacary Bauer RN MSN.  At this time patient appears to be a potential candidate for CIR. I will place a rehab consult per protocol for full assessment. Please call me with any questions. ? ?Braxdon Gappa RN MSN ?Admissions Coordinator ?[phone] ?  ?

## 2021-09-01 NOTE — Progress Notes (Signed)
[facility] Progress Note  Name: Felicia Williams MRN: [id number] DOB: 1941-02-06  Chief Complaint:  Abdominal pain   Subjective:  Neurology was consulted and agreed with changing cefepime and has done EEG monitoring.  EEG abnormal and they felt c/w possible seizures and/or encephalopathy.  No uop charted.  PD has been charted in error so ins/outs not accurate - discussed w/nursing.  Her daughter has been at bedside.  She has been more alert and interactive.  She states patient had difficulty breathing when lying flat and appeared to "choke".  She had 1.6 liters UF over 2/9 with PD per her machine this am.   Review of systems:   Unable to obtain given altered mental status   --------- Background on consult:  Felicia Williams is a 81 y.o. female with ESRD on CCPD x 4 years, HL, HTN, and GERD who was admitted as transfer from [facility] with peritonitis.   Presented to [facility] on 2/4 with 2-day Hx of generalized abdominal pain which came on suddenly. + nausea, but no vomiting or diarrhea. No fever or chills. No CP or dyspnea. Felt similar to prior episode of peritonitis. She was afebrile. Labs with WBC 14.8, Hgb 9.7, LA 3- > 2.6, K 3.7, Alb 2.9. CXR clear. Abd CT without acute findings. She had Blood Cx drawn (NGTD) and was started on Vanc/Ceftriaxone, and then given Cefepime. She was transferred to [facility]. PD Fluid Cx/cell count/gram stain were ordered, but unable to be completed until this AM (after antibiotics) when she arrived at [facility], as [facility] does not have any PD supplies or capabilities. The PD fluid effluent collected this AM was VERY CLOUDY.  Today - she continues to have generalized abdominal pain but remains afebrile. Last BM yesterday. She is unclear her exact PD prescription, knows that she does 5 cycles overnight, no day dwell. Uses 2 different dextrose bags, unclear on fill volume. She does not add heparin.   Intake/Output Summary (Last 24 hours) at 09/01/2021 0756 Last data filed at  09/01/2021 0500 Gross per 24 hour  Intake 9066.28 ml  Output 8726 ml  Net 340.28 ml    Vitals:  Vitals:   09/01/21 0000 09/01/21 0346 09/01/21 0400 09/01/21 0500  BP: (!) 149/69 (!) 181/70 (!) 173/65 (!) 159/65  Pulse: 78  81   Resp: 15  15 13   Temp:  98.2 F (36.8 C)    TempSrc:  Oral    SpO2: 99%  96%   Weight:  55.8 kg    Height:         Physical Exam:    General adult female in bed in no acute distress HEENT normocephalic atraumatic extraocular movements intact sclera anicteric Neck supple trachea midline Lungs clear to auscultation bilaterally normal work of breathing at rest  Heart S1S2 no rub Abdomen soft nontender nondistended Extremities no edema  Psych blunted affect  Neuro patient tracks visually and says "hi" today but does not answer questions.  She does not follow commands.  More interactive and alert today  Medications reviewed   Labs:  BMP Latest Ref Rng & Units 09/01/2021 08/31/2021 08/30/2021  Glucose 70 - 99 mg/dL 130(H) 136(H) 157(H)  BUN 8 - 23 mg/dL 60(H) 66(H) 62(H)  Creatinine 0.44 - 1.00 mg/dL 10.17(H) 9.72(H) 9.29(H)  Sodium 135 - 145 mmol/L 137 136 133(L)  Potassium 3.5 - 5.1 mmol/L 3.3(L) 3.2(L) 2.9(L)  Chloride 98 - 111 mmol/L 98 97(L) 93(L)  CO2 22 - 32 mmol/L 24 24 24   Calcium 8.9 -  10.3 mg/dL 9.3 8.9 8.3(L)   CCPD, coordinated through [facility] (Dr. Holley Raring) [phone]  57 kg EDW Last ESA was dosed at home per the HD unit. Mircera is ordered for 50 mcg on 08/10/21. May be able to give again here soon.  [phone] phone number for outpatient PD RN line   Assessment/Plan:   PD associated peritonitis: Blood Cx negative. Started on Vanc/Cefepime prior to able to collect PD fluid sample. Sample collected this morning which was very cloudy -> gram stain done and fluid Cx still NGTD.  pharmacy was consulted for Vanc dosing. [phone] total nucleated cells on first sample sent Vanc being dosed per pharmacy Changed cefepime to ancef given  encephalopathy - ancef 2 gram every MWF while here She started abx on 2/4 and will need two weeks of therapy with vanc 1 gram MWF and ancef per outpatient protocol.  I have confirmed with her outpatient PD unit that she could get both Blood cx negative (final) ESRD:  CCPD tonight - all 5 exchanges 2.5 %   Altered mental status - concerning for neurotoxicity from cefepime.  changed to an alternate agent ancef.  Appreciate neurology and primary team.  EEG c/w seizure and/or encephalopathy.  This does not represent uremia from my standpoint  Hypertension/volume: optimize volume with PD  Anemia:  aranesp 100 mcg on 2/7. On mircera outpatient as above. Iron is replete Metabolic bone disease: hyperphos and states currently not taking a binder. Cannot swallow pills and was given Rx Phoslyra and too expensive.  Has been on sevelamer powder here but this is expensive as well.  Adjust regimen outpatient as needed per primary nephrologist Hypokalemia - she has already gotten a dose of potassium 40 meq once today.  Would plan for potassium chloride 20 meq daily supplement on discharge. Would check labs with outpatient PD unit to guide   Disposition - would continue inpatient monitoring given encephalopathy   note [phone] phone number for outpatient PD RN line.  She is to present to her PD unit the day after discharge to coordinate antibiotics for PD outpatient.    Claudia Desanctis, MD 09/01/2021 8:35 AM

## 2021-09-01 NOTE — Progress Notes (Signed)
LTM maintain done. No skin breakdown noted. Patient has been rubbing on head and several leads had to be replaced. Results pending.

## 2021-09-01 NOTE — Progress Notes (Addendum)
RN attempted to feed patient applesauce and sherbet off dinner tray. Patient is sitting at 60 degrees, upright. Patient started coughing and gagging when swallowing the applesauce. RN yankeured the patient to retrieve applesauce. Saturation remained 95-98%. Patient verbalize "Oh God" while gagging. RN held her dinner tray. Patient shook her head to the next bite of food. MD paged and notified. Agricultural consultant notified. Will continue to monitor patient. Patient upright 60 degree still.

## 2021-09-02 ENCOUNTER — Inpatient Hospital Stay (HOSPITAL_COMMUNITY): Payer: Medicare Other

## 2021-09-02 LAB — RENAL FUNCTION PANEL
Albumin: 2.2 g/dL — ABNORMAL LOW (ref 3.5–5.0)
Anion gap: 16 — ABNORMAL HIGH (ref 5–15)
BUN: 62 mg/dL — ABNORMAL HIGH (ref 8–23)
CO2: 24 mmol/L (ref 22–32)
Calcium: 9.5 mg/dL (ref 8.9–10.3)
Chloride: 98 mmol/L (ref 98–111)
Creatinine, Ser: 10.82 mg/dL — ABNORMAL HIGH (ref 0.44–1.00)
GFR, Estimated: 3 mL/min — ABNORMAL LOW (ref >60)
Glucose, Bld: 136 mg/dL — ABNORMAL HIGH (ref 70–99)
Phosphorus: 6.3 mg/dL — ABNORMAL HIGH (ref 2.5–4.6)
Potassium: 3.7 mmol/L (ref 3.5–5.1)
Sodium: 138 mmol/L (ref 135–145)

## 2021-09-02 LAB — VANCOMYCIN, RANDOM: Vancomycin Rm: 33

## 2021-09-02 MED ORDER — POLYETHYLENE GLYCOL 3350 17 G PO PACK: 17.0000 g | PACK | Freq: Every day | ORAL | Status: DC

## 2021-09-02 MED ORDER — METOPROLOL TARTRATE 5 MG/5ML IV SOLN
5.0000 mg | Freq: Three times a day (TID) | INTRAVENOUS | Status: DC
  Administered 2021-09-02 – 2021-09-04 (×5): 5 mg via INTRAVENOUS
  Filled 2021-09-02 (×5): qty 5

## 2021-09-02 MED ORDER — POLYETHYLENE GLYCOL 3350 17 G PO PACK: 17.0000 g | PACK | Freq: Every day | ORAL | Status: DC | PRN

## 2021-09-02 MED ORDER — METOPROLOL TARTRATE 5 MG/5ML IV SOLN
2.5000 mg | Freq: Four times a day (QID) | INTRAVENOUS | Status: DC
  Administered 2021-09-02: 2.5 mg via INTRAVENOUS
  Filled 2021-09-02: qty 5

## 2021-09-02 MED ORDER — FAMOTIDINE IN NACL 20-0.9 MG/50ML-% IV SOLN
20.0000 mg | INTRAVENOUS | Status: DC
  Administered 2021-09-02 – 2021-09-03 (×2): 20 mg via INTRAVENOUS
  Filled 2021-09-02 (×3): qty 50

## 2021-09-02 MED ORDER — POLYETHYLENE GLYCOL 3350 17 G PO PACK: 17.0000 g | PACK | Freq: Once | ORAL | Status: DC

## 2021-09-02 MED ORDER — LORAZEPAM 2 MG/ML IJ SOLN: 1.0000 mg | Freq: Once | INTRAMUSCULAR | Status: DC | PRN

## 2021-09-02 MED ORDER — PANTOPRAZOLE SODIUM 40 MG IV SOLR
40.0000 mg | INTRAVENOUS | Status: DC
  Administered 2021-09-02 – 2021-09-03 (×2): 40 mg via INTRAVENOUS
  Filled 2021-09-02 (×2): qty 10

## 2021-09-02 MED ORDER — HYDRALAZINE HCL 20 MG/ML IJ SOLN
10.0000 mg | Freq: Once | INTRAMUSCULAR | Status: AC
  Administered 2021-09-02: 10 mg via INTRAVENOUS
  Filled 2021-09-02: qty 1

## 2021-09-02 MED ORDER — CARVEDILOL 12.5 MG PO TABS: 12.5000 mg | ORAL_TABLET | Freq: Two times a day (BID) | ORAL | Status: DC

## 2021-09-02 NOTE — Plan of Care (Signed)
  Problem: Nutrition: Goal: Adequate nutrition will be maintained Outcome: Progressing   Problem: Coping: Goal: Level of anxiety will decrease Outcome: Progressing   Problem: Elimination: Goal: Will not experience complications related to bowel motility Outcome: Progressing Goal: Will not experience complications related to urinary retention Outcome: Progressing   Problem: Pain Managment: Goal: General experience of comfort will improve Outcome: Progressing   

## 2021-09-02 NOTE — Progress Notes (Signed)
[facility] Progress Note  Name: Felicia Williams MRN: [id number] DOB: 05-25-1941  Chief Complaint:  Abdominal pain   Subjective:  ins/outs not accurate and have discussed - related to charting of PD fluids.  She did have two unmeasured urine voids.  She had 0.8 L UF over 2/10 evening treatment.  This is the most alert that she has been.  Per OT at bedside she said "I love you" to her son.  I said "hey" to her and she said "hey" back.   She offers no history and seems to repeat my words but improved from before.   Review of systems:   Unable to obtain reliably given altered mental status  Daughter previously reported constipation and no BM charted  --------- Background on consult:  Felicia Williams is a 81 y.o. female with ESRD on CCPD x 4 years, HL, HTN, and GERD who was admitted as transfer from [facility] with peritonitis.   Presented to [facility] on 2/4 with 2-day Hx of generalized abdominal pain which came on suddenly. + nausea, but no vomiting or diarrhea. No fever or chills. No CP or dyspnea. Felt similar to prior episode of peritonitis. She was afebrile. Labs with WBC 14.8, Hgb 9.7, LA 3- > 2.6, K 3.7, Alb 2.9. CXR clear. Abd CT without acute findings. She had Blood Cx drawn (NGTD) and was started on Vanc/Ceftriaxone, and then given Cefepime. She was transferred to [facility]. PD Fluid Cx/cell count/gram stain were ordered, but unable to be completed until this AM (after antibiotics) when she arrived at [facility], as [facility] does not have any PD supplies or capabilities. The PD fluid effluent collected this AM was VERY CLOUDY.  Today - she continues to have generalized abdominal pain but remains afebrile. Last BM yesterday. She is unclear her exact PD prescription, knows that she does 5 cycles overnight, no day dwell. Uses 2 different dextrose bags, unclear on fill volume. She does not add heparin.   Intake/Output Summary (Last 24 hours) at 09/02/2021 0754 Last data filed at 09/02/2021 0400 Gross per 24  hour  Intake 9272.24 ml  Output 10608 ml  Net -1335.76 ml    Vitals:  Vitals:   09/02/21 0052 09/02/21 0216 09/02/21 0350 09/02/21 0400  BP: (!) 186/78 (!) 164/64 (!) 184/87 (!) 171/85  Pulse:   74 75  Resp:   16 16  Temp:   98.7 F (37.1 C)   TempSrc:   Oral   SpO2:   96% 95%  Weight:   56.6 kg   Height:         Physical Exam:     General adult female in bed in no acute distress HEENT normocephalic atraumatic extraocular movements intact sclera anicteric Neck supple trachea midline Lungs clear to auscultation bilaterally normal work of breathing at rest  Heart S1S2 no rub Abdomen soft nontender nondistended Extremities no edema  Psych blunted affect  Neuro patient tracks visually and says "hey" today but when I ask her her name she states "Dr. Royce Macadamia" and then the year is "Dr. Royce Macadamia", as well.  More interactive and alert today Access: PD catheter intact   Medications reviewed   Labs:  BMP Latest Ref Rng & Units 09/01/2021 08/31/2021 08/30/2021  Glucose 70 - 99 mg/dL 130(H) 136(H) 157(H)  BUN 8 - 23 mg/dL 60(H) 66(H) 62(H)  Creatinine 0.44 - 1.00 mg/dL 10.17(H) 9.72(H) 9.29(H)  Sodium 135 - 145 mmol/L 137 136 133(L)  Potassium 3.5 - 5.1 mmol/L 3.3(L) 3.2(L) 2.9(L)  Chloride  98 - 111 mmol/L 98 97(L) 93(L)  CO2 22 - 32 mmol/L 24 24 24   Calcium 8.9 - 10.3 mg/dL 9.3 8.9 8.3(L)   CCPD, coordinated through [facility] (Dr. Holley Raring) [phone]  57 kg EDW Last ESA was dosed at home per the HD unit. Mircera is ordered for 50 mcg on 08/10/21. May be able to give again here soon.  [phone] phone number for outpatient PD RN line   Assessment/Plan:   PD associated peritonitis: Blood Cx negative. Started on Vanc/Cefepime prior to able to collect PD fluid sample. Sample collected this morning which was very cloudy -> gram stain done and fluid Cx was negative (perhaps after abx?).  pharmacy was consulted for Vanc dosing. 6384 total nucleated cells on first sample sent which has  improved. Vanc being dosed per pharmacy Changed cefepime to ancef given encephalopathy - ancef 2 gram every MWF while here She started abx on 2/4 and will need two weeks of therapy with vanc 1 gram MWF and ancef per outpatient protocol.  I have confirmed with her outpatient PD unit that she could get both Blood cx negative (final) ESRD:  CCPD tonight - all 5 exchanges 2.5 %.  I have ordered labs today and tomorrow and await labs.   Altered mental status - concerning for neurotoxicity from cefepime.  changed to an alternate agent ancef.  Appreciate neurology and primary team.  EEG c/w seizure and/or encephalopathy.  This does not represent uremia from my standpoint  Hypertension/volume: optimize volume with PD. Increase coreg from 12.5 mg daily (reported as home dose) to 12.5 mg BID.  Hydralazine IV once now.  Anemia:  s/p aranesp 100 mcg on 2/7. On mircera outpatient as above. Iron is replete Metabolic bone disease: hyperphos and previously stated not currently taking a binder at home. Cannot swallow pills and was given Rx Phoslyra and too expensive.  Has been on sevelamer powder here but this is expensive as well.  Adjust regimen outpatient as needed per primary nephrologist Hypokalemia - she has already gotten a dose of potassium 40 meq once today.  Would plan for potassium chloride 20 meq daily supplement on discharge. Would check labs with outpatient PD unit to guide   Disposition - would continue inpatient monitoring given encephalopathy, which is thankfully improving.    note [phone] phone number for outpatient PD RN line.  She is to present to her PD unit the day after discharge to coordinate antibiotics for PD outpatient.    Claudia Desanctis, MD 09/02/2021 8:14 AM

## 2021-09-02 NOTE — Progress Notes (Signed)
MRI called and updated RN that MRI was unsuccessful due to patient inability to remain flat going into the MRI machine. Patient started heaving and gagging - per MRI technician. Notified Hongalgi and neurology NP about unsuccessful MRI. No new orders. Patient en route back to unit.

## 2021-09-02 NOTE — Evaluation (Addendum)
Occupational Therapy Evaluation Patient Details Name: Felicia Williams MRN: [id number] DOB: Dec 16, 1940 Today's Date: 09/02/2021   History of Present Illness Pt is a 81 y.o. F who presents 08/26/2021 with severe abdominal pain, nausea, dry heaving. Found to have leukocytosis and lactic acid elevation. CT abd/pelvis revealed no acute intraabdominal findings. IV antibiotics started for suspicion of bacterial peritonitis. Significant PMH: ESRD on PD, HTN, HLD, spinal stenosis.   Clinical Impression   Pt presented in bed with son at bedside. PT was able to say I love you following son repeating to pt. PT then made small mumbles, yes, no and some facial grimacing when taking BP. Pt noted to make some gestures for items in session but it was noted when bring item to mouth started to cough/gaging and pt noted to be fearful as reported "oh God"  and started to have watery eye. Nurse and SLP entered room and was informed about how it appeared they may not be able to clear tongue and unable to clear saliva in mouth. Pt currently with functional limitations due to the deficits listed below (see OT Problem List).  Pt will benefit from skilled OT to increase their safety and independence with ADL and functional mobility for ADL to facilitate discharge to venue listed below.        Recommendations for follow up therapy are one component of a multi-disciplinary discharge planning process, led by the attending physician.  Recommendations may be updated based on patient status, additional functional criteria and insurance authorization.   Follow Up Recommendations  CIR  Assistance Recommended at Discharge    Patient can return home with the following A lot of help with walking and/or transfers;A lot of help with bathing/dressing/bathroom;Assistance with cooking/housework;Assistance with feeding;Direct supervision/assist for medications management;Direct supervision/assist for financial management;Assist for  transportation    Functional Status Assessment  Patient has had a recent decline in their functional status and demonstrates the ability to make significant improvements in function in a reasonable and predictable amount of time.  Equipment Recommendations       Recommendations for Other Services       Precautions / Restrictions Precautions Precautions: Fall Precaution Comments: tremors/jerking movements, monitor BP Restrictions Weight Bearing Restrictions: No      Mobility Bed Mobility Overal bed mobility: Needs Assistance Bed Mobility: Rolling Rolling: Max assist         General bed mobility comments: attempted to roll pt but due to BP limited in changes as increased readings in nurse notified    Transfers                          Balance                                           ADL either performed or assessed with clinical judgement   ADL Overall ADL's : Needs assistance/impaired Eating/Feeding: Total assistance;Cueing for safety;Cueing for sequencing;Bed level Eating/Feeding Details (indicate cue type and reason): Pt noted to start to gag/cough with oral movements at this time but did not have anything in mouth. SLP came into room and described what was occuring at this time Grooming: Wash/dry hands;Total assistance;Bed level   Upper Body Bathing: Total assistance;Cueing for safety;Cueing for sequencing;Bed level   Lower Body Bathing: Total assistance;+2 for physical assistance;+2 for safety/equipment;Bed level   Upper Body Dressing : Total assistance;Bed  level   Lower Body Dressing: Total assistance;+2 for physical assistance;+2 for safety/equipment;Bed level                 General ADL Comments: Pt limited in activity due to BP, coughin/gaging with positioning     Vision         Perception     Praxis      Pertinent Vitals/Pain Pain Assessment Pain Assessment: Faces Faces Pain Scale: Hurts a little  bit Breathing: normal Negative Vocalization: none Facial Expression: smiling or inexpressive Body Language: relaxed Consolability: no need to console PAINAD Score: 0 Pain Location: generalized discomfort, pt unable to localize, noted when taking BP readings but noted to be fearful when started to cough Pain Descriptors / Indicators: Sore Pain Intervention(s): Limited activity within patient's tolerance, Monitored during session     Hand Dominance     Extremity/Trunk Assessment Upper Extremity Assessment Upper Extremity Assessment: Generalized weakness;RUE deficits/detail;LUE deficits/detail RUE Deficits / Details: Pt noted to increase time to process all comands but appeard increase in flacid to L and decrease in ability to grip in R compared to L hand LUE Deficits / Details: PT able to completed some motions torwards objects of different sizes   Lower Extremity Assessment Lower Extremity Assessment: Defer to PT evaluation       Communication Communication Communication: No difficulties   Cognition Arousal/Alertness: Awake/alert Behavior During Therapy: Flat affect Overall Cognitive Status: Difficult to assess Area of Impairment: Attention, Following commands, Safety/judgement, Awareness, Problem solving, Memory, Orientation                 Orientation Level: Disoriented to, Place, Time, Situation Current Attention Level: Focused Memory: Decreased recall of precautions, Decreased short-term memory Following Commands: Follows one step commands inconsistently Safety/Judgement: Decreased awareness of deficits, Decreased awareness of safety Awareness: Intellectual Problem Solving: Slow processing, Requires verbal cues, Decreased initiation, Difficulty sequencing, Requires tactile cues General Comments: pt noted to say I love you with son in room, mimicked md when room's name and said yes most of the time in the room. Pt when starting to cough noted to say oh god      General Comments       Exercises Exercises: General Upper Extremity General Exercises - Upper Extremity Shoulder Flexion: AAROM, AROM, Supine, Both (with HOB elevated due to aspiration risk) Shoulder ADduction: AROM, AAROM, Both   Shoulder Instructions      Home Living Family/patient expects to be discharged to:: Private residence Living Arrangements: Other relatives Available Help at Discharge: Family Type of Home: House Home Access: Stairs to enter Technical brewer of Steps: 1   Home Layout: Laundry or work area in basement     Bathroom Shower/Tub: Tub/shower unit         [place]: Kasandra Knudsen - single Barista (2 wheels);Wheelchair - manual          Prior Functioning/Environment Prior Level of Function : Independent/Modified Independent;Driving                        OT Problem List: Decreased strength;Decreased range of motion;Decreased activity tolerance;Impaired balance (sitting and/or standing);Decreased cognition;Decreased coordination;Decreased safety awareness;Decreased knowledge of use of DME or AE;Pain      OT Treatment/Interventions: Self-care/ADL training;Therapeutic exercise;Therapeutic activities;Patient/family education;Balance training    OT Goals(Current goals can be found in the care plan section) Acute Rehab OT Goals Patient Stated Goal: pt unable to report OT Goal Formulation: With patient Time For Goal Achievement: 09/16/21 Potential  to Achieve Goals: Fair ADL Goals Pt Will Perform Grooming: with mod assist;sitting Pt Will Perform Upper Body Bathing: with mod assist;bed level Pt Will Perform Lower Body Bathing: with mod assist;sit to/from stand Additional ADL Goal #1: Pt will be able to hold onto tool for more than 1 min to complete ADL care while sitting/bed level  OT Frequency: Min 2X/week    Co-evaluation              AM-PAC OT "6 Clicks" Daily Activity     Outcome Measure Help from another person  eating meals?: Total Help from another person taking care of personal grooming?: Total Help from another person toileting, which includes using toliet, bedpan, or urinal?: Total Help from another person bathing (including washing, rinsing, drying)?: Total Help from another person to put on and taking off regular upper body clothing?: Total Help from another person to put on and taking off regular lower body clothing?: Total 6 Click Score: 6   End of Session Nurse Communication:  (BP, swallowing)  Activity Tolerance: Other (comment) (BP, coughing/gagaing and SLP needed to see) Patient left: in bed;with call bell/phone within reach;Other (comment) (SLP and nurse in room)  OT Visit Diagnosis: Unsteadiness on feet (R26.81);Other abnormalities of gait and mobility (R26.89);Repeated falls (R29.6);Muscle weakness (generalized) (M62.81);Ataxia, unspecified (R27.0);Pain Pain - Right/Left:  (general pain unable to determine where)                Time: 0750-0824 OT Time Calculation (min): 34 min Charges:  OT General Charges $OT Visit: 1 Visit OT Evaluation $OT Eval Low Complexity: 1 mod OT Treatments $Self Care/Home Management : 8-22 mins  Joeseph Amor OTR/L  Acute Rehab Services  [phone] office number [phone] pager number   Joeseph Amor 09/02/2021, 8:47 AM

## 2021-09-02 NOTE — Progress Notes (Signed)
MRI called about an update on the status of MRI BRAIN w/o contrast. There are no prn medications to give prior to MRI. Paged to notify neurology.

## 2021-09-02 NOTE — Progress Notes (Signed)
Spoke with neuro on call about pt's risk for aspiration with MRI due to laying flat. MD stated to hold MRI until pt is more cognitively aware.

## 2021-09-02 NOTE — Progress Notes (Addendum)
PROGRESS NOTE   Felicia Williams  RDE:[id number]    DOB: February 15, 1941    DOA: 08/26/2021  PCP: [name], NP   I have briefly reviewed patients previous medical records in [facility].  Chief Complaint  Patient presents with   Abdominal Pain    Hospital Course:  Felicia Williams is an 81 y.o. female with a history of ESRD on PD, HTN, HLD, spinal stenosis, and GERD who presented to the ED at [name] 2/4 with severe abdominal pain, nausea and dry heaving. She was found to have leukocytosis, lactic acid elevation. CT abd/pelvis revealed no acute intraabdominal findings. IV antibiotics were started for suspicion of bacterial peritonitis and the patient was admitted to [facility]. Peritoneal fluid appeared milky with neutrophil count 8,667 and GPC on gram stain.  Peritoneal fluid cultures remain negative to date.  Treating for PD associated peritonitis.  Acute metabolic encephalopathy, suspected due to cefepime and peritonitis, cefepime discontinued.  Nephrology on board.  AMS worse 2/9, CT head without acute abnormalities, neurology consulted, having myoclonic jerks, LTM EEG suggestive of toxic metabolic encephalopathy.  Mental status slowly improving.NPO not due to mechanical dysphagia but due to AMS and not triggering swallow consistently.   Assessment & Plan:  Principal Problem:   Peritonitis associated with peritoneal dialysis [facility]) Active Problems:   Acute metabolic encephalopathy   Hypokalemia   Macrocytic anemia   Essential hypertension, benign   End stage renal disease (HCC)   Gastroesophageal reflux disease   N&V (nausea and vomiting)   Sepsis (HCC)   Protein-calorie malnutrition, moderate (HCC)   Pruritus   Shortness of breath   Assessment and Plan: * Peritonitis associated with peritoneal dialysis ([place])- (present on admission) Sepsis due to peritonitis, POA. Peritoneal dialysis-associated bacterial peritonitis -Treated with broad antibiotic coverage pending blood culture  (negative, final report) peritoneal fluid culture NGTD (sent 2/4). Gram stain +GPC and milky peritoneal fluid.  - Continuing vancomycin.  Cefepime discontinued due to concern for AMS then switched over to cefazolin on 2/8.  As per nephrology, both of these can be given across dialysis.  Acute metabolic encephalopathy Suspected multifactorial due to cefepime (primary etiology), sepsis from peritonitis, hospital delirium. Delirium precautions.  Minimize sedatives and opioids-all discontinued 2/9. Cefepime discontinued.  Last dose was on 2/7. Myoclonic jerks noted by family and neurology on 2/9 CT head without acute abnormalities.  Ammonia level: 32. Suspected delayed clearance of cefepime due to ESRD. Per nephrology, unlikely from ESRD/uremia. Neurology input appreciated, LTM EEG indicative of severe toxic/metabolic encephalopathy as opposed to a seizure pattern. B12: 827, TSH 0.865 and RPR nonreactive. Mental status slowly but surely improving. SLP consulted due to?  Aspiration episode on 2/9.  As per speech therapy input today, do not suspect mechanical dysphagia but more due to AMS and patient not triggering swallow consistently. NPO. SLP will continue to follow.  Hypokalemia Replaced IV on 2/10. Potassium normal today.  Macrocytic anemia- (present on admission) Hemoglobin stable in the low 7 g range. Follow CBC closely and transfuse if hemoglobin 7 g or less. Hemoglobin in the 8.5- 8.8 range over the last 2 days. B12 normal, ferritin 1,857 and folate 45.6.  Essential hypertension, benign- (present on admission) Uncontrolled.  Got a dose of IV hydralazine by nephrology earlier today. Since NPO, switched carvedilol to metoprolol 5 mg IV every 8 hours. Continue as needed IV labetalol. When able switch back to carvedilol 12.5 Mg twice daily.  End stage renal disease ([place])- (present on admission) Nephrology consulted for routine PD.  Discussed with Dr. Royce Macadamia 2/8, 2/9 at  bedside.  Gastroesophageal reflux disease- (present on admission) Continue PPI  Shortness of breath Negative acute CXR. Atelectasis likely contributing. No hypoxia or chest pain. At risk of reflux and on PPI.  No dyspnea reported and is not hypoxic.  Pruritus- (present on admission) Moisturizing lotion ordered. TBili wnl, BUN not severely/acutely elevated.   Protein-calorie malnutrition, moderate ([place])- (present on admission) Supplement protein as able. Hypoalbuminemia noted at 2.2.   Sepsis ([place])- (present on admission) Tachycardia, leukocytosis, lactic acidosis with a suspected source of peritonitis.  Management as noted above Sepsis features have resolved.  N&V (nausea and vomiting)- (present on admission) Due to peritonitis Antiemetic prn Seems to have resolved.  However has significantly diminished appetite.  Dietitian consulted yesterday and has liberalized diet choices.   Body mass index is 25.2 kg/m.  Nutritional Status Nutrition Problem: Increased nutrient needs Etiology: acute illness Signs/Symptoms: estimated needs Interventions: MVI, Ensure Enlive (each supplement provides 350kcal and 20 grams of protein), Prostat, Liberalize Diet  Pressure Ulcer:     DVT prophylaxis: heparin injection 5,000 Units Start: 08/27/21 0600     Code Status: Full Code:  Family Communication: None at bedside this morning. Disposition:  Status is: Inpatient Remains inpatient appropriate because: Severity of illness     Consultants:   Nephrology Neurology  Procedures:   Peritoneal dialysis LTM EEG  Antimicrobials:   As noted above   Subjective:  Evaluated patient along with patient's RN and neurology PA at bedside.  Patient alert, smiling.  Still not following instructions.  Keeps repeating the first response to all subsequent questions.  Kept saying "good morning" after I had greeted her with the same.  Objective:   Vitals:   09/02/21 0824 09/02/21 0930 09/02/21  1113 09/02/21 1136  BP: (!) 199/81 (!) 156/71 (!) 152/70 (!) 166/76  Pulse:  95 82 85  Resp:  20 18 (!) 21  Temp:   98.5 F (36.9 C)   TempSrc:   Oral   SpO2:  98% 98% 98%  Weight:      Height:        General exam: Elderly female, moderately built and nourished lying comfortably propped up in bed without distress.  Looks much improved compared to 3 to 4 days ago. Respiratory system: Clear to auscultation.  No increased work of breathing. Cardiovascular system: S1 and S2 heard, RRR.  No JVD, murmurs or pedal edema.  Telemetry personally reviewed: Sinus rhythm. Gastrointestinal system: Abdomen is nondistended, soft, nontender.  PD catheter site without acute findings.  Normal bowel sounds heard. Central nervous system: Alert, not oriented, does not follow instructions.  Smiling.  No staring episodes noted today.  Consistently tracking activity around with her eyes.  Still keeps repeating same words again and again for all questions.  No focal deficits. Extremities: Symmetric 5 x 5 power. Skin: No rashes, lesions or ulcers Psychiatry: Judgement and insight are impaired. Mood & affect flat.     Data Reviewed:   I have personally reviewed following labs and imaging studies   CBC: Recent Labs  Lab 08/26/21 1814 08/27/21 0120 08/28/21 0804 08/30/21 0217 08/31/21 0108 09/01/21 0326  WBC 14.8* 21.2*   < > 6.9 8.6 9.6  NEUTROABS 13.7* 18.9*  --   --   --   --   HGB 9.7* 7.7*   < > 7.3* 8.5* 8.8*  HCT 27.4* 21.6*   < > 21.3* 24.2* 25.0*  MCV 103.4* 103.3*   < > 102.4*  103.4* 102.9*  PLT 242 224   < > 240 266 296   < > = values in this interval not displayed.    Basic Metabolic Panel: Recent Labs  Lab 08/27/21 0120 08/28/21 0804 08/29/21 0353 08/30/21 0217 08/31/21 0108 09/01/21 0326 09/02/21 0311  NA 136   < > 133* 133* 136 137 138  K 4.2   < > 3.5 2.9* 3.2* 3.3* 3.7  CL 99   < > 95* 93* 97* 98 98  CO2 20*   < > 23 24 24 24 24   GLUCOSE 107*   < > 121* 157* 136* 130*  136*  BUN 72*   < > 69* 62* 66* 60* 62*  CREATININE 10.38*   < > 9.51* 9.29* 9.72* 10.17* 10.82*  CALCIUM 7.9*   < > 8.3* 8.3* 8.9 9.3 9.5  MG 2.0  --   --   --   --   --   --   PHOS 7.9*   < > 6.6* 5.8* 5.4* 6.2* 6.3*   < > = values in this interval not displayed.    Liver Function Tests: Recent Labs  Lab 08/26/21 1814 08/27/21 0120 08/28/21 0804 08/29/21 0353 08/30/21 0217 08/31/21 0108 09/01/21 0326 09/02/21 0311  AST 24 17  --   --   --   --   --   --   ALT 27 23  --   --   --   --   --   --   ALKPHOS 71 62  --   --   --   --   --   --   BILITOT 0.4 0.3  --   --   --   --   --   --   PROT 6.5 5.9*  --   --   --   --   --   --   ALBUMIN 2.9* 2.5*   < > 2.4* 2.0* 2.0* 2.1* 2.2*   < > = values in this interval not displayed.    CBG: Recent Labs  Lab 08/27/21 1058 08/27/21 1516  GLUCAP 95 97    Microbiology Studies:   Recent Results (from the past 240 hour(s))  Blood culture (routine x 2)     Status: None   Collection Time: 08/26/21  8:39 PM   Specimen: Left Antecubital; Blood  Result Value Ref Range Status   Specimen Description LEFT ANTECUBITAL  Final   Special Requests   Final    BOTTLES DRAWN AEROBIC AND ANAEROBIC Blood Culture results may not be optimal due to an excessive volume of blood received in culture bottles   Culture   Final    NO GROWTH 5 DAYS Performed at [facility], [address], [place], [place] [address]    Report Status 08/31/2021 FINAL  Final  Blood culture (routine x 2)     Status: None   Collection Time: 08/26/21  8:46 PM   Specimen: BLOOD LEFT HAND  Result Value Ref Range Status   Specimen Description BLOOD LEFT HAND  Final   Special Requests   Final    BOTTLES DRAWN AEROBIC AND ANAEROBIC Blood Culture results may not be optimal due to an excessive volume of blood received in culture bottles   Culture   Final    NO GROWTH 5 DAYS Performed at [facility], [address], [place], [place] [address]    Report Status 08/31/2021  FINAL  Final  Gram stain  Status: None   Collection Time: 08/26/21  9:12 PM   Specimen: Peritoneal Washings  Result Value Ref Range Status   Specimen Description PERITONEAL FLUID  Final   Special Requests NONE  Final   Gram Stain   Final    WBC PRESENT, PREDOMINANTLY PMN GRAM POSITIVE COCCI CYTOSPIN SMEAR Performed at [facility], [address], [place], [place] [address]    Report Status 08/27/2021 FINAL  Final  Culture, body fluid w Gram Stain-bottle     Status: None   Collection Time: 08/26/21  9:12 PM   Specimen: Peritoneal Washings  Result Value Ref Range Status   Specimen Description PERITONEAL FLUID  Final   Special Requests NONE  Final   Culture   Final    NO GROWTH 5 DAYS Performed at [facility] [address] [place], [place] [address]    Report Status 09/01/2021 FINAL  Final  Resp Panel by RT-PCR (Flu A&B, Covid) Nasopharyngeal Swab     Status: None   Collection Time: 08/26/21  9:14 PM   Specimen: Nasopharyngeal Swab; Nasopharyngeal(NP) swabs in vial transport medium  Result Value Ref Range Status   SARS Coronavirus 2 by RT PCR NEGATIVE NEGATIVE Final    Comment: (NOTE) SARS-CoV-2 target nucleic acids are NOT DETECTED.  The SARS-CoV-2 RNA is generally detectable in upper respiratory specimens during the acute phase of infection. The lowest concentration of SARS-CoV-2 viral copies this assay can detect is 138 copies/mL. A negative result does not preclude SARS-Cov-2 infection and should not be used as the sole basis for treatment or other patient management decisions. A negative result may occur with  improper specimen collection/handling, submission of specimen other than nasopharyngeal swab, presence of viral mutation(s) within the areas targeted by this assay, and inadequate number of viral copies(<138 copies/mL). A negative result must be combined with clinical observations, patient history, and epidemiological information. The expected  result is Negative.  Fact Sheet for Patients:  EntrepreneurPulse.com.au  Fact Sheet for Healthcare Providers:  IncredibleEmployment.be  This test is no t yet approved or cleared by the [place] FDA and  has been authorized for detection and/or diagnosis of SARS-CoV-2 by FDA under an Emergency Use Authorization (EUA). This EUA will remain  in effect (meaning this test can be used) for the duration of the COVID-19 declaration under Section 564(b)(1) of the Act, 21 U.S.C.section 360bbb-3(b)(1), unless the authorization is terminated  or revoked sooner.       Influenza A by PCR NEGATIVE NEGATIVE Final   Influenza B by PCR NEGATIVE NEGATIVE Final    Comment: (NOTE) The Xpert Xpress SARS-CoV-2/FLU/RSV plus assay is intended as an aid in the diagnosis of influenza from Nasopharyngeal swab specimens and should not be used as a sole basis for treatment. Nasal washings and aspirates are unacceptable for Xpert Xpress SARS-CoV-2/FLU/RSV testing.  Fact Sheet for Patients: EntrepreneurPulse.com.au  Fact Sheet for Healthcare Providers: IncredibleEmployment.be  This test is not yet approved or cleared by the [place] FDA and has been authorized for detection and/or diagnosis of SARS-CoV-2 by FDA under an Emergency Use Authorization (EUA). This EUA will remain in effect (meaning this test can be used) for the duration of the COVID-19 declaration under Section 564(b)(1) of the Act, 21 U.S.C. section 360bbb-3(b)(1), unless the authorization is terminated or revoked.  Performed at [facility], [address], [place], [place] [address]   MRSA Next Gen by PCR, Nasal     Status: None   Collection Time:  08/27/21 10:37 AM   Specimen: Nasal Mucosa; Nasal Swab  Result Value Ref Range Status   MRSA by PCR Next Gen NOT DETECTED NOT DETECTED Final    Comment: (NOTE) The GeneXpert MRSA Assay (FDA approved for NASAL  specimens only), is one component of a comprehensive MRSA colonization surveillance program. It is not intended to diagnose MRSA infection nor to guide or monitor treatment for MRSA infections. Test performance is not FDA approved in patients less than 82 years old. Performed at [facility], [place] [address], [place], [place] [address]     Radiology Studies:  EEG adult  Result Date: 08/31/2021 Greta Doom, MD     08/31/2021  8:06 PM History: 81 yo F with encephalopathy Sedation: None Technique: This EEG was acquired with electrodes placed according to the International 10-20 electrode system (including Fp1, Fp2, F3, F4, C3, C4, P3, P4, O1, O2, T3, T4, T5, T6, A1, A2, Fz, Cz, Pz). The following electrodes were missing or displaced: none. Background: The background consists of periodic high voltage triphasic morphology discharges with a shifting bifrontal predominance with a frequency of 1.5 - 2 Hz. There is no clear evolution. Folllowing ativan administration, there was some improvement in the EEG as the patient became more drowsy, but they returned following stimulation. Photic stimulation: Physiologic driving is not performed EEG Abnormalities: 1) Generalized periodic discharges(GPDs) with triphasic morphology 2) generalized irregular slow activity. Clinical Interpretation: This markedly abnormal EEG recorded a pattern that is on the ictal-interictal continuum. It is associated with encephalopathy, but can also be associated with cortical irritability and seizure. The fluctuation with drowsiness would favor an encephalopathy etiology. Further characterization with LTM EEG is recommended. Roland Rack, MD [facility] [phone] If 7pm- 7am, please page neurology on call as listed in [place].   Overnight EEG with video  Result Date: 09/01/2021 Samuella Cota, MD     09/01/2021  8:11 AM EEG Procedure CPT/Type of Study: 91478; 24hr EEG with video Referring Provider:  [facility] Primary Neurological Diagnosis: encephalopathy History: This is a 81 yr old patient, undergoing an EEG to evaluate for delirium, abnl EEG. Clinical State: obtunded Technical Description: The EEG was performed using standard setting per the guidelines of American Clinical Neurophysiology Society (ACNS). A minimum of 21 electrodes were placed on scalp according to the International 10-20 or/and 10-10 Systems. Supplemental electrodes were placed as needed. Single EKG electrode was also used to detect cardiac arrhythmia. Patient's behavior was continuously recorded on video simultaneously with EEG. A minimum of 16 channels were used for data display. Each epoch of study was reviewed manually daily and as needed using standard referential and bipolar montages. Computerized quantitative EEG analysis (such as compressed spectral array analysis, trending, automated spike & seizure detection) were used as indicated. Day 1: from 1535 08/31/21 to 0730 09/01/21 EEG Description: Overall Amplitude: High Predominant Frequency: The background activity showed theta, with about 4-5 Hz, that was frequent. Superimposed Frequencies: sparse beta activity bilaterally The background was symmetric Background Abnormalities: Generalized slowing; theta and some delta Rhythmic or periodic pattern: Generalized periodic discharges (GPD): frequent long runs of 1-2Hz  spiky GPDs, triphasic-appearing at times. These were increased with stimulation. Epileptiform activity: Yes: spiky morphology to GPD Electrographic seizures: no; no discrete seizures Events: no Breach rhythm: no Reactivity: Present Stimulation procedures: Hyperventilation: not done Photic stimulation: no change Sleep Background: Stage II EKG:no significant arrhythmia Impression: This was an abnormal continuous video EEG due to frequent runs of stimulus-induced sharp GPDs, indicative of a severe  toxic/metabolic encephalopathy pattern. These discharges were not appreciably improved  with treatment with benzodiazepines. This appeared more consistent with an encephalopathy pattern as opposed to a seizure pattern.    Scheduled Meds:    (feeding supplement) PROSource Plus  30 mL Oral BID BM   feeding supplement  237 mL Oral BID BM   gentamicin cream  1 application Topical Daily   gentamicin cream  1 application Topical Daily   heparin  5,000 Units Subcutaneous Q8H   metoprolol tartrate  5 mg Intravenous Q8H   multivitamin  1 tablet Oral QHS   pantoprazole (PROTONIX) IV  40 mg Intravenous Q24H   polyethylene glycol  17 g Oral Once   sevelamer carbonate  2.4 g Oral TID WC    Continuous Infusions:    sodium chloride Stopped (09/01/21 1220)    ceFAZolin (ANCEF) IV 1 g (09/01/21 2118)   dialysis solution 2.5% low-MG/low-CA     famotidine (PEPCID) IV 20 mg (09/02/21 1055)     LOS: 7 days     Vernell Leep, MD,  FACP, [facility], [facility] (Care Management Physician Certified) [place]  To contact the attending provider between 7A-7P or the covering provider during after hours 7P-7A, please log into the web site [email or web address] and access using universal [place] password for that web site. If you do not have the password, please call the hospital operator.  09/02/2021, 11:57 AM

## 2021-09-02 NOTE — Progress Notes (Signed)
Neurology Progress Note  Brief HPI: Felicia Williams is a 81 y.o. female with a history of ESRD on PD, HTN, HLD, spinal stenosis, and GERD who presented to the ED at [facility] 2/4 with severe abdominal pain, nausea and dry heaving. She was found to have leukocytosis, lactic acid elevation. CT abd/pelvis revealed no acute intraabdominal findings. IV antibiotics were started for suspicion of bacterial peritonitis and the patient was admitted to [facility]. Peritoneal fluid appeared milky with neutrophil count 8,667 and GPC on gram stain.  Peritoneal fluid cultures remain negative to date.  Treating for PD associated peritonitis.  Acute metabolic encephalopathy, suspected due to cefepime and peritonitis.  Subjective: No family at bedside. Patient alert and tracking but unable to follow commands. Per MD at bedside who has followed her care, she is more alert today. She does however still have repetitive and limited speech including "good morning" "yeah" and "uh huh".   Exam: Vitals:   09/02/21 0400 09/02/21 0824  BP: (!) 171/85 (!) 199/81  Pulse: 75   Resp: 16   Temp:    SpO2: 95%    Gen: Sitting up comfortably in bed, more awake and alert than yesterday Resp: non-labored breathing, no respiratory distress Abd: soft, non-tender, non-distended  Neuro: Mental Status: Patient is awake, alert, tracks examiner. She responds to examiner's good morning with "good morning" and does perseverate on this for a bit. She does say "yeah" and "uh huh" frequently to orientation questions.  She is unable to provide accurate answers to orientation questions.  She appears to intermittently mimic examiner to squeeze hands but does not consistently follow other commands. She remains densely encephalopathic.  Cranial Nerves: II: Blinks to threat bilaterally. Pupils are equal, round, and reactive to light.   III,IV, VI: She tracks examiner throughout room during examination.  VII: Facial movement is symmetric.  VIII: Hearing  is intact to voice Motor: She does not cooperate with formal strength testing, does not follow commands.  Intermittently elevates the left upper extremity antigravity and strongly withdraws each extremity with application of noxious stimuli.  Sensory: She responds to noxious stimulation all four extremities  Deep Tendon Reflexes: 2+ and symmetric in the biceps.  3+ in bilateral patellae and ankles with nonsustained clonus on the right and sustained clonus on the left ankle with dorsiflexion  Plantars: Toes are downgoing bilaterally.  Cerebellar: She does not perform  Pertinent Labs: CBC Latest Ref Rng & Units 09/01/2021 08/31/2021 08/30/2021  WBC 4.0 - 10.5 K/uL 9.6 8.6 6.9  Hemoglobin 12.0 - 15.0 g/dL 8.8(L) 8.5(L) 7.3(L)  Hematocrit 36.0 - 46.0 % 25.0(L) 24.2(L) 21.3(L)  Platelets 150 - 400 K/uL 296 266 240   CMP Latest Ref Rng & Units 09/02/2021 09/01/2021 08/31/2021  Glucose 70 - 99 mg/dL 136(H) 130(H) 136(H)  BUN 8 - 23 mg/dL 62(H) 60(H) 66(H)  Creatinine 0.44 - 1.00 mg/dL 10.82(H) 10.17(H) 9.72(H)  Sodium 135 - 145 mmol/L 138 137 136  Potassium 3.5 - 5.1 mmol/L 3.7 3.3(L) 3.2(L)  Chloride 98 - 111 mmol/L 98 98 97(L)  CO2 22 - 32 mmol/L 24 24 24   Calcium 8.9 - 10.3 mg/dL 9.5 9.3 8.9  Total Protein 6.5 - 8.1 g/dL - - -  Total Bilirubin 0.3 - 1.2 mg/dL - - -  Alkaline Phos 38 - 126 U/L - - -  AST 15 - 41 U/L - - -  ALT 0 - 44 U/L - - -   Imaging Reviewed: No new neurologic imaging at this time.  Assessment: Felicia Williams is a 81 y.o. female with a history of ESRD on PD, HTN, HLD, spinal stenosis, and GERD who presented to the ED at [facility] 2/4 with severe abdominal pain, nausea and dry heaving. Neuro was consulted after patient had 2 days of worsening mental status. EEG did not reveal any definite ongoing seizure activity, and at this point I think that cefepime neurotoxicity is by far the most likely etiology, LTM discontinued 2/10.   Impression: Toxic Metabolic Encephalopathy due  to cefepime neurotoxicity and peritonitis. Patient appears to be slowly improving in mental status. LTM EEG indicative of metabolic encephalopathy rather than seizure.   Recommendations: - MRI brain without contrast - Neurology will continue to follow  Anibal Henderson, AGACNP-BC [facility]  [phone]  I have seen the patient and reviewed the above note.  She continues to follow commands.  I continue to strongly suspect cefepime induced neurotoxicity, but her degree of aphasia seems out of proportion to her degree of encephalopathy.  I would favor an MRI to rule out a multifactorial process, though my suspicion is it is slightly low yield.  Roland Rack, MD [facility] [phone]  If 7pm- 7am, please page neurology on call as listed in [place].

## 2021-09-02 NOTE — Progress Notes (Addendum)
Speech Language Pathology Treatment: Dysphagia  Patient Details Name: Felicia Williams MRN: [id number] DOB: 1941-01-02 Today's Date: 09/02/2021 Time: 1017-5102 SLP Time Calculation (min) (ACUTE ONLY): 12 min  Assessment / Plan / Recommendation Clinical Impression  Pt seen for reevaluation per MD order as RN reported pt aspirated.  SLP spoke to RN - RN confirms aspiration event *coughing* with applesauce consumption, and even on saliva when pt returning from CT.  OT was in room when SLP arrived and reported pt coughing on saliva with OT empty toothbrush at mouth.  Question some component of GI issue given h/o GERD - however mentation also barrier.  SLP provided po trials including nectar juice, tsps water and icecream. Pt with very poor sustained attention - despite total cues to swallow.  Oral holding with delayed swallow up to 11 seconds with juice - followed by immediate and delayed throat clearing.  Pt does not follow directions to cough nor swallow despite total cues.  Presumed potential premature spillage of boluses into phayrnx finally eliciting a swallow.  At this time, pt does not appear with mechanical dysphagia clinically, however poor mentation prevents her from having safe po.   Given pt concern for recurrent aspiration events and current mentation, recommend pt be npo x tsps thin water given by RN when pt fully alert and accepting *having pt help hand over hand for neuro response*.  SLP will follow up for reassessment next am 09/03/2021.     SLP sent message to MD and discussed recommendations with RN and spoke to OT regarding issue during session.      HPI HPI: Pt is a 81 y.o. female who presented to the ED at [facility] 2/4 with severe abdominal pain, nausea and dry heaving. She was found to have leukocytosis, lactic acid elevation. Acute metabolic encephalopathy, suspected due to cefepime and peritonitis, cefepime discontinued.  AMS worse 2/9, CT head without acute abnormalities, neurology  consulted, having myoclonic jerks, LTM EEG suggestive of toxic metabolic encephalopathy. Per MD order "Daughter reports patient appears to be choking when laying flat. Has had poor PO intake for past few days". BSE requested. PMH: ESRD on PD, HTN, HLD, spinal stenosis, and GERD.      SLP Plan  Continue with current plan of care      Recommendations for follow up therapy are one component of a multi-disciplinary discharge planning process, led by the attending physician.  Recommendations may be updated based on patient status, additional functional criteria and insurance authorization.    Recommendations  Diet recommendations: NPO (tsps of water from RN) Medication Administration: Via alternative means Compensations: Minimize environmental distractions;Slow rate;Small sips/bites                Oral Care Recommendations: Oral care BID Follow Up Recommendations:  (tbd) Assistance recommended at discharge: Frequent or constant Supervision/Assistance SLP Visit Diagnosis: Dysphagia, unspecified (R13.10) Plan: Continue with current plan of care           Vertell Limber, [place] SLP [place] Office [phone] Pager [phone]  09/02/2021, 9:06 AM

## 2021-09-03 ENCOUNTER — Inpatient Hospital Stay (HOSPITAL_COMMUNITY): Payer: Medicare Other

## 2021-09-03 LAB — RENAL FUNCTION PANEL
Albumin: 2.4 g/dL — ABNORMAL LOW (ref 3.5–5.0)
Anion gap: 19 — ABNORMAL HIGH (ref 5–15)
BUN: 61 mg/dL — ABNORMAL HIGH (ref 8–23)
CO2: 22 mmol/L (ref 22–32)
Calcium: 9.6 mg/dL (ref 8.9–10.3)
Chloride: 97 mmol/L — ABNORMAL LOW (ref 98–111)
Creatinine, Ser: 11.1 mg/dL — ABNORMAL HIGH (ref 0.44–1.00)
GFR, Estimated: 3 mL/min — ABNORMAL LOW (ref >60)
Glucose, Bld: 138 mg/dL — ABNORMAL HIGH (ref 70–99)
Phosphorus: 7.1 mg/dL — ABNORMAL HIGH (ref 2.5–4.6)
Potassium: 3.4 mmol/L — ABNORMAL LOW (ref 3.5–5.1)
Sodium: 138 mmol/L (ref 135–145)

## 2021-09-03 LAB — VANCOMYCIN, RANDOM: Vancomycin Rm: 32

## 2021-09-03 LAB — MAGNESIUM: Magnesium: 2.4 mg/dL (ref 1.7–2.4)

## 2021-09-03 MED ORDER — LORAZEPAM 2 MG/ML IJ SOLN: 1.0000 mg | Freq: Once | INTRAMUSCULAR | Status: DC | PRN

## 2021-09-03 MED ORDER — HALOPERIDOL LACTATE 5 MG/ML IJ SOLN
2.0000 mg | Freq: Once | INTRAMUSCULAR | Status: AC | PRN
  Administered 2021-09-03: 2 mg via INTRAVENOUS
  Filled 2021-09-03: qty 1

## 2021-09-03 MED ORDER — POLYETHYLENE GLYCOL 3350 17 G PO PACK
17.0000 g | PACK | Freq: Once | ORAL | Status: AC
  Administered 2021-09-03: 17 g via ORAL
  Filled 2021-09-03: qty 1

## 2021-09-03 MED ORDER — POTASSIUM CHLORIDE 10 MEQ/100ML IV SOLN
10.0000 meq | INTRAVENOUS | Status: DC
  Administered 2021-09-03: 10 meq via INTRAVENOUS
  Filled 2021-09-03 (×2): qty 100

## 2021-09-03 MED ORDER — POTASSIUM CHLORIDE 10 MEQ/100ML IV SOLN
10.0000 meq | Freq: Once | INTRAVENOUS | Status: AC
Start: 2021-09-03 — End: 2021-09-03
  Administered 2021-09-03: 10 meq via INTRAVENOUS

## 2021-09-03 NOTE — Plan of Care (Signed)

## 2021-09-03 NOTE — PMR Pre-admission (Signed)
PMR Admission Coordinator Pre-Admission Assessment  Patient: Felicia Williams is an 81 y.o., female MRN: [id number] DOB: 1941-06-08 Height: '4\' 11"'  (149.9 cm) Weight: 55.1 kg  Insurance Information HMO:     PPO:      PCP:      IPA:      80/20:      OTHER:  PRIMARY: UHC Medicare      Policy#: [id number]      Subscriber: patient CM Name: Wilburn Cornelia      Phone#: [phone] option #7     Fax#: [phone] Pre-Cert#: [id number]   approved for 7 days   Employer:  Benefits:  Phone #: [phone]     Name: 2/14 Eff. Date: 07/23/21     Deduct: none      Out of Pocket Max: $3600      Life Max: none CIR: $295 co pay per day days 1 until 5      SNF: no copay days 1 until 20; $196 co pay per day days 21 until 39; no c co pay days 40 until 100 Outpatient: $20 per visit     Co-Pay: visits per medical neccesity Home Health: 100%      Co-Pay: visits per medical neccesity DME: 80%     Co-Pay: 20% Providers: in-network SECONDARY:       Policy#:      Phone#:   Development worker, community:       Phone#:   [employer] for patients in Inpatient Rehabilitation Facilities with attached Privacy Act [place] Records was provided and verbally reviewed with: Family  Emergency Contact Information Contact Information     Name Relation Home Work [place] Son [phone]  [phone]   Betti Cruz Daughter   [phone]      Current Medical History  Patient Admitting Diagnosis: sepsis d/t peritonitis  History of Present Illness:  81 year old female with medical hx significant for: ESRD on PD, HTN, HLD, spinal stenosis, GERD. Pt presented to [facility] on 08/26/21 due to complaints of stomach tenderness with associated nausea and vomiting. CBC showed leukocytosis and elevated lactic acid. Chest x-ray unremarkable. CT abdomen  negative for acute findings. Pt transferred to [facility]. Peritoneal fluid appeared milky with neutrophil count 8,667 and GPC on  gram stain. Pt treated for suspicion of bacterial peritonitis. Peritoneal fluid cultures negative. Acute metabolic encephalopathy noted 08/30/21. Suspected d/t cefepime and peritonitis; cefepime discontinued. Neurology consulted d/t AMS. CT head negative for acute abnormalities. EEG on 08/31/21 was abnormal; associated with encephalopathy and/or seizure. LTM EEG on 09/01/21 was consistent with toxic/metabolic encephalopathy rather than seizure. MRI showed no acute findings.   Patient's medical record from [facility] has been reviewed by the rehabilitation admission coordinator and physician.  Past Medical History  Past Medical History:  Diagnosis Date   Anemia in stage 4 chronic kidney disease ([place]) 08/18/2018   Depression    Essential hypertension, benign    GERD (gastroesophageal reflux disease)    Glomerulonephritis    Gout    Mixed hyperlipidemia    Renal insufficiency    Has the patient had major surgery during 100 days prior to admission? No  Family History   family history includes CAD in her father; Diabetes Mellitus II in her father; Heart attack in her father; Hypertension in her father; Lupus in her brother.  Current Medications  Current Facility-Administered Medications:    (feeding supplement) PROSource Plus liquid 30 mL, 30 mL, Oral, BID BM, Stephania Fragmin  R, PA-C, 30 mL at 09/07/21 0948   0.9 %  sodium chloride infusion, , Intravenous, PRN, [name], MD, Stopped at 09/01/21 1220   acetaminophen (TYLENOL) tablet 650 mg, 650 mg, Oral, Q6H PRN **OR** acetaminophen (TYLENOL) suppository 650 mg, 650 mg, Rectal, Q6H PRN, Zierle-Ghosh, Asia B, DO, 650 mg at 09/02/21 4098   camphor-menthol (SARNA) lotion, , Topical, PRN, [name], MD   carvedilol (COREG) tablet 12.5 mg, 12.5 mg, Oral, BID WC, Hongalgi, Anand D, MD, 12.5 mg at 09/07/21 0948   Chlorhexidine Gluconate Cloth 2 % PADS 6 each, 6 each, Topical, Daily, Hongalgi, Lenis Dickinson, MD, 6 each at 09/06/21 0837    [START ON 09/08/2021] Darbepoetin Alfa (ARANESP) injection 100 mcg, 100 mcg, Subcutaneous, Q Thu-1800, Einar Grad, RPH   dialysis solution 2.5% low-MG/low-CA dianeal solution, , Intraperitoneal, Q24H, Donnamae Jude, RPH, 5,000 mL at 09/06/21 2029   famotidine (PEPCID) tablet 20 mg, 20 mg, Oral, Daily, Alvira Philips, RPH, 20 mg at 09/07/21 0948   feeding supplement (ENSURE ENLIVE / ENSURE PLUS) liquid 237 mL, 237 mL, Oral, BID BM, [name], MD, 237 mL at 09/04/21 1056   gentamicin cream (GARAMYCIN) 0.1 % 1 application, 1 application, Topical, Daily, Claudia Desanctis, MD, 1 application at 11/91/47 1147   heparin injection 5,000 Units, 5,000 Units, Subcutaneous, Q8H, Zierle-Ghosh, Asia B, DO, 5,000 Units at 09/07/21 0545   labetalol (NORMODYNE) injection 10 mg, 10 mg, Intravenous, Q6H PRN, Opyd, Ilene Qua, MD, 10 mg at 09/02/21 1607   loperamide (IMODIUM) capsule 2 mg, 2 mg, Oral, PRN, Wendee Beavers T, MD, 2 mg at 09/07/21 0142   meropenem (MERREM) 500 mg in sodium chloride 0.9 % 100 mL IVPB, 500 mg, Intravenous, Q24H, Hongalgi, Anand D, MD, Last Rate: 200 mL/hr at 09/07/21 1002, 500 mg at 09/07/21 1002   multivitamin (RENA-VIT) tablet 1 tablet, 1 tablet, Oral, QHS, [name], MD, 1 tablet at 09/06/21 2143   ondansetron (ZOFRAN) tablet 4 mg, 4 mg, Oral, Q6H PRN **OR** ondansetron (ZOFRAN) injection 4 mg, 4 mg, Intravenous, Q6H PRN, Zierle-Ghosh, Asia B, DO, 4 mg at 09/03/21 1018   pantoprazole (PROTONIX) EC tablet 40 mg, 40 mg, Oral, Daily, Alvira Philips, RPH, 40 mg at 09/07/21 8295   polyethylene glycol (MIRALAX / GLYCOLAX) packet 17 g, 17 g, Oral, Daily PRN, Claudia Desanctis, MD   sevelamer carbonate (RENVELA) powder PACK 2.4 g, 2.4 g, Oral, TID WC, Stovall, Kathryn R, PA-C, 2.4 g at 09/07/21 1148   vancomycin variable dose per unstable renal function (pharmacist dosing), , Does not apply, See admin instructions, Einar Grad, RPH  Patients Current Diet:  Diet Order              DIET DYS 3 Room service appropriate? Yes; Fluid consistency: Thin  Diet effective now                  Precautions / Restrictions Precautions Precautions: Fall Precaution Comments: tremors/jerky movements ( increasing in use of functional tasks) Restrictions Weight Bearing Restrictions: No   Has the patient had 2 or more falls or a fall with injury in the past year? No  Prior Activity Level Limited Community (1-2x/wk): MD appointment and dialysis center  Prior Functional Level Self Care: Did the patient need help bathing, dressing, using the toilet or eating? Needed some help  Indoor Mobility: Did the patient need assistance with walking from room to room (with or without device)? Independent  Stairs: Did the patient need assistance with internal or external stairs (with or without device)? Needed some help  Functional Cognition: Did the patient need help planning regular tasks such as shopping or remembering to take medications? Independent  Patient Information Are you of Hispanic, Latino/a,or Spanish origin?: A. No, not of Hispanic, Latino/a, or Spanish origin What is your race?: A. White Do you need or want an interpreter to communicate with a doctor or health care staff?: 0. No  Patient's Response To:  Health Literacy and Transportation Is the patient able to respond to health literacy and transportation needs?: Yes Health Literacy - How often do you need to have someone help you when you read instructions, pamphlets, or other written material from your doctor or pharmacy?: Never In the past 12 months, has lack of transportation kept you from medical appointments or from getting medications?: No In the past 12 months, has lack of transportation kept you from meetings, work, or from getting things needed for daily living?: No  Home Assistive Devices / [place] Devices/Equipment: Environmental consultant (specify type), Cane (specify quad or straight) Home Equipment:  Cane - single point, Conservation officer, nature (2 wheels), Wheelchair - manual  Prior Device Use: Indicate devices/aids used by the patient prior to current illness, exacerbation or injury? Walker and cane  Current Functional Level Cognition  Overall Cognitive Status: Impaired/Different from baseline Difficult to assess due to: Impaired communication Current Attention Level: Focused Orientation Level: Oriented X4 Following Commands: Follows multi-step commands inconsistently Safety/Judgement: Decreased awareness of safety General Comments: pt continues to make good improvements, able to verbalize needs but seems to have had mix up in communication between myself and RN, pt otherwise able to follow all simple cues and self-monitor for safety and fatigue    Extremity Assessment (includes Sensation/Coordination)  Upper Extremity Assessment: RUE deficits/detail, LUE deficits/detail RUE Deficits / Details: Pt noted to still have difficulties with tremors RUE Coordination: decreased fine motor LUE Deficits / Details: Pt noted to still have difficulties with tremors  Lower Extremity Assessment: Defer to PT evaluation RLE Deficits / Details: Strength 5/5 LLE Deficits / Details: Strength 5/5    ADLs  Overall ADL's : Needs assistance/impaired Eating/Feeding: Set up, Sitting Eating/Feeding Details (indicate cue type and reason): Pt noted to start to gag/cough with oral movements at this time but did not have anything in mouth. SLP came into room and described what was occuring at this time Grooming: Wash/dry hands, Wash/dry face, Set up, Sitting Upper Body Bathing: Minimal assistance, Cueing for safety, Cueing for sequencing, Sitting Lower Body Bathing: Moderate assistance, Sit to/from stand Upper Body Dressing : Minimal assistance, Sitting Lower Body Dressing: Moderate assistance, Sit to/from stand Toilet Transfer: Min guard, Cueing for safety, Cueing for sequencing Toileting- Clothing Manipulation and  Hygiene: Cueing for safety, Cueing for sequencing, Minimal assistance Toileting - Clothing Manipulation Details (indicate cue type and reason): Pt was able to start post set up but fatigued in standing and requires assist to have good quality of hygiene Functional mobility during ADLs: Min guard, Rolling walker (2 wheels) General ADL Comments: Pt reporting they feel like they have sea legs with prolonged standing    Mobility  Overal bed mobility: Modified Independent Bed Mobility: Supine to Sit Rolling: Max assist Supine to sit: Supervision, HOB elevated (head at 90 degrees) Sit to supine: Mod assist General bed mobility comments: pt sitting EOB upon my arrival    Transfers  Overall transfer level: Needs assistance Equipment used: Rolling walker (2 wheels), 1  person hand held assist, None Transfers: Sit to/from Stand Sit to Stand: Min assist, Min guard General transfer comment: progressed from minA to minG with continued reps. pt completing x5 in 24 seconds, x6 in 30 seconds    Ambulation / Gait / Stairs / Wheelchair Mobility  Ambulation/Gait Ambulation/Gait assistance: Min assist, Mod assist Gait Distance (Feet): 30 Feet (+15 ft) Assistive device: Rolling walker (2 wheels), 1 person hand held assist Gait Pattern/deviations: Step-to pattern, Decreased stride length, Decreased dorsiflexion - right, Decreased dorsiflexion - left, Shuffle, Narrow base of support, Trunk flexed General Gait Details: pt with small strides and trunk flexed, cues for posture. trial without DME and pt needing modA through HHA as well as reaching for furniture in room to steady Gait velocity: decreased Gait velocity interpretation: <1.31 ft/sec, indicative of household ambulator    Posture / Balance Dynamic Sitting Balance Sitting balance - Comments: able to static sit unsupported in chair without UE support or assist. Balance Overall balance assessment: Needs assistance Sitting-balance support: Feet  supported Sitting balance-Leahy Scale: Good Sitting balance - Comments: able to static sit unsupported in chair without UE support or assist. Postural control: Left lateral lean Standing balance support: Single extremity supported, During functional activity Standing balance-Leahy Scale: Poor Standing balance comment: BUE support preferred, able to manage short bout with single UE support and modA    Special needs/care consideration CCPD nightly ; Daughter, Pam assist with CCPD nightly   Previous Home Environment  Living Arrangements:  [name], daughter to assist)  Lives With: Family Available Help at Discharge: Family, Available 24 hours/day Type of Home: House Home Layout: Able to live on main level with bedroom/bathroom, Laundry or work area in basement Home Access: Stairs to enter Entrance Stairs-Rails: None Technical brewer of Steps: 1 Bathroom Shower/Tub: Optometrist: Yes (daughter/pt think a walker should be able to fit into the bathroom) How Accessible: Accessible via walker Home Care Services: No  Discharge Living Setting Plans for Discharge Living Setting: Patient's home Type of Home at Discharge: House Discharge Home Layout: Able to live on main level with bedroom/bathroom, Laundry or work area in basement Discharge Home Access: Stairs to enter Entrance Stairs-Rails: None Technical brewer of Steps: 1 Discharge Bathroom Shower/Tub: Designer, fashion/clothing: Standard Discharge Bathroom Accessibility: Yes (daughter/pt think a walker should be able to fit into the bathroom) How Accessible: Accessible via walker Does the patient have any problems obtaining your medications?: Yes (Describe) (some medications are expensive)  Social/Family/Support Systems Patient Roles: Parent Contact Information: daughter, Pam Anticipated Caregiver: Pam, daughter Anticipated Caregiver's Contact Information:  see contacts Caregiver Availability: 24/7 Discharge Plan Discussed with Primary Caregiver: Yes Is Caregiver In Agreement with Plan?: Yes Does Caregiver/Family have Issues with Lodging/Transportation while Pt is in Rehab?: Yes  Granddaughter, Environmental education officer lives with patient  Goals Patient/Family Goal for Rehab: supervision with PT, OT and SLP Expected length of stay: ELOS 10 to 14 days Pt/Family Agrees to Admission and willing to participate: Yes Program Orientation Provided & Reviewed with Pt/Caregiver Including Roles  & Responsibilities: Yes  Decrease burden of Care through IP rehab admission: NA  Possible need for SNF placement upon discharge: Not anticipated  Patient Condition: I have reviewed medical records from [facility], spoken with CSW, and patient and daughter. I met with patient at the bedside and discussed via phone for inpatient rehabilitation assessment.  Patient will benefit from ongoing PT and OT, can actively participate in 3 hours of therapy a day  5 days of the week, and can make measurable gains during the admission.  Patient will also benefit from the coordinated team approach during an Inpatient Acute Rehabilitation admission.  The patient will receive intensive therapy as well as Rehabilitation physician, nursing, social worker, and care management interventions.  Due to safety, skin/wound care, disease management, medication administration, and patient education the patient requires 24 hour a day rehabilitation nursing.  The patient is currently Min G-Mod A with mobility and basic ADLs.  Discharge setting and therapy post discharge at home with home health is anticipated.  Patient has agreed to participate in the Acute Inpatient Rehabilitation Program and will admit today.  Preadmission Screen Completed By:  Danne Baxter RN MSN 09/07/2021 12:15 PM ______________________________________________________________________   Discussed status with Dr. Dagoberto Ligas on 09/07/21 at  1215 and received approval for admission today.  Admission Coordinator:  Danne Baxter RN MSN, time  1215 Date  09/07/21   Assessment/Plan: Diagnosis: Does the need for close, 24 hr/day Medical supervision in concert with the patient's rehab needs make it unreasonable for this patient to be served in a less intensive setting? Yes Co-Morbidities requiring supervision/potential complications: perotonitis- ESRD on (+_PD; toxic metabolic encephalopathy- stopped Cephalosporins due to myoclonic jerks Due to bladder management, bowel management, safety, skin/wound care, disease management, medication administration, pain management, and patient education, does the patient require 24 hr/day rehab nursing? Yes Does the patient require coordinated care of a physician, rehab nurse, PT, OT, and SLP to address physical and functional deficits in the context of the above medical diagnosis(es)? Yes Addressing deficits in the following areas: balance, endurance, locomotion, strength, transferring, bowel/bladder control, bathing, dressing, feeding, grooming, toileting, and cognition Can the patient actively participate in an intensive therapy program of at least 3 hrs of therapy 5 days a week? Yes The potential for patient to make measurable gains while on inpatient rehab is good and fair Anticipated functional outcomes upon discharge from inpatient rehab: supervision PT, supervision OT, supervision SLP Estimated rehab length of stay to reach the above functional goals is: 10-14 days Anticipated discharge destination: Home 10. Overall Rehab/Functional Prognosis: good   MD Signature:

## 2021-09-03 NOTE — Progress Notes (Signed)
[facility] Progress Note  Name: Felicia Williams MRN: [id number] DOB: 09/11/1940  Chief Complaint:  Abdominal pain   Subjective:  ins/outs not accurate - related to charting of PD fluids.  She did have one unmeasured urine void over 2/11.  She had 1318 mL UF with PD from 2/11 evening treatment per the machine at bedside.  Yesterday PO BP meds were stopped due to concern re: swallow.  She states "I'm doing pretty good" twice for me today.  When I said she appears to be getting better she said "you think so?".  I called her daughter on the phone can didn't get her but left a message to update her - improving mental status from my standpoint.   Review of systems:   Unable to obtain reliably given altered mental status  Daughter previously reported constipation and no BM charted  --------- Background on consult:  Felicia Williams is a 81 y.o. 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   Presented to [facility] on 2/4 with 2-day Hx of generalized abdominal pain which came on suddenly. + nausea, but no vomiting or diarrhea. No fever or chills. No CP or dyspnea. Felt similar to prior episode of peritonitis. She was afebrile. Labs with WBC 14.8, Hgb 9.7, LA 3- > 2.6, K 3.7, Alb 2.9. CXR clear. Abd CT without acute findings. She had Blood Cx drawn (NGTD) and was started on Vanc/Ceftriaxone, and then given Cefepime. She was transferred to [facility]. PD Fluid Cx/cell count/gram stain were ordered, but unable to be completed until this AM (after antibiotics) when she arrived at [facility], as [facility] does not have any PD supplies or capabilities. The PD fluid effluent collected this AM was VERY CLOUDY.  Today - she continues to have generalized abdominal pain but remains afebrile. Last BM yesterday. She is unclear her exact PD prescription, knows that she does 5 cycles overnight, no day dwell. Uses 2 different dextrose bags, unclear on fill volume. She  does not add heparin.   Intake/Output Summary (Last 24 hours) at 09/03/2021 1937 Last data filed at 09/03/2021 0400 Gross per 24 hour  Intake 9114 ml  Output 9818 ml  Net -704 ml    Vitals:  Vitals:   09/02/21 1927 09/02/21 1928 09/02/21 2303 09/03/21 0404  BP: (!) 175/61 (!) 173/72 (!) 157/75 (!) 156/79  Pulse: 79 84 80   Resp: 18 16 19 14   Temp:  98.2 F (36.8 C) 98.2 F (36.8 C) 99.1 F (37.3 C)  TempSrc:  Oral Oral Oral  SpO2: 98% 97% 94% 96%  Weight:    54.9 kg  Height:         Physical Exam:    General adult female in bed in no acute distress HEENT normocephalic atraumatic extraocular movements intact sclera anicteric Neck supple trachea midline Lungs clear to auscultation bilaterally normal work of breathing at rest  Heart S1S2 no rub Abdomen soft nontender nondistended Extremities no edema  Psych blunted affect  Neuro patient tracks visually and offers "I'm doing pretty good" when asked.  She then repeats the same when I ask her name.  Does eventually give her first and last name.  Speaking more today.  Does not provide year - just repeats a previous answer to me.  More interactive and alert today Access: PD catheter intact   Medications reviewed   Labs:  BMP Latest Ref Rng & Units 09/03/2021 09/02/2021 09/01/2021  Glucose 70 - 99 mg/dL 138(H) 136(H) 130(H)  BUN 8 - 23 mg/dL 61(H) 62(H) 60(H)  Creatinine 0.44 - 1.00 mg/dL 11.10(H) 10.82(H) 10.17(H)  Sodium 135 - 145 mmol/L 138 138 137  Potassium 3.5 - 5.1 mmol/L 3.4(L) 3.7 3.3(L)  Chloride 98 - 111 mmol/L 97(L) 98 98  CO2 22 - 32 mmol/L 22 24 24   Calcium 8.9 - 10.3 mg/dL 9.6 9.5 9.3   CCPD, coordinated through [facility] (Dr. Holley Raring) [phone]  57 kg EDW Last ESA was dosed at home per the HD unit. Mircera is ordered for 50 mcg on 08/10/21. May be able to give again here soon.  [phone] phone number for outpatient PD RN line   Assessment/Plan:   PD associated peritonitis: Blood Cx negative. Started  on Vanc/Cefepime prior to able to collect PD fluid sample. Sample collected this morning which was very cloudy -> gram stain done and fluid Cx was negative (perhaps after abx?).  pharmacy was consulted for Vanc dosing. 1308 total nucleated cells on first sample sent which has improved on recheck. Vanc being dosed per pharmacy Changed cefepime to ancef given encephalopathy - ancef 2 gram every MWF while here She started abx on 2/4 and will need two weeks of therapy with vanc 1 gram MWF and ancef per outpatient protocol.  I have confirmed with her outpatient PD unit that she could get both Blood cx negative (final) ESRD:  CCPD tonight - all 5 exchanges 2.5 %.  Renal panel in am Altered mental status - concerning for neurotoxicity from cefepime.  changed to an alternate agent ancef.  Appreciate neurology and primary team.  EEG c/w seizure and/or encephalopathy.  This does not represent uremia from my standpoint.  Improving  Hypertension/volume: optimize volume with PD. Note PO coreg was discontinued and she was transitioned to IV beta blocker scheduled per primary team. Hopeful swallow has improved given improvement in mental status Anemia:  s/p aranesp 100 mcg on 2/7. On mircera outpatient as above. Iron is replete. CBC in AM.  Metabolic bone disease: hyperphos and previously stated not currently taking a binder at home. Cannot swallow pills and was given Rx Phoslyra and too expensive.  Has been on sevelamer powder here but this is expensive as well.  Adjust regimen outpatient as needed per primary nephrologist  Hypokalemia - potassium 10 meq IV once.  Would plan for potassium chloride 20 meq daily supplement on discharge. Would check labs close hosp follow-up labs with outpatient PD unit to guide Constipation - no BM charted. Miralax once today (if swallow assessed as acceptable - seems improved exam to me) and PRN   Disposition - would continue inpatient monitoring given encephalopathy, which is  thankfully improving overall     note [phone] phone number for outpatient PD RN line.  She is to present to her PD unit the day after discharge to coordinate antibiotics for PD outpatient.    Claudia Desanctis, MD 09/03/2021 7:01 AM

## 2021-09-03 NOTE — Progress Notes (Signed)
Speech Language Pathology Treatment: Dysphagia  Patient Details Name: Felicia Williams MRN: [id number] DOB: 1941-03-01 Today's Date: 09/03/2021 Time: 6606-0045 SLP Time Calculation (min) (ACUTE ONLY): 25 min  Assessment / Plan / Recommendation Clinical Impression  Patient seen by SLP with focus on dysphagia treatment. RN reported that she gave patient some cranberry juice and she drank this without observed difficulty. SLP that saw patient previous date recommended NPO except ice chips due to overt s/s aspiration and poor mentation. When SLP entered room for current therapy session, she was awake, alert, would verbally resopnd "uh huh" or "uh uh" for yes and no. She intially declined water, crackers, cereal but when presented with diced fruit cup she nodded head. She was able to feed self entire cup of fruit and although she exhibited prolonged mastication and suspected delayed swallow initiation, no overt s/s aspiration or penetration and no oral residuals post swallows. Patient did verbalize at phrase level one time, "That MRI was the worst experience I've ever had." She started to close eyes but would wake up without cues and resume PO intake. Approximately 15 minutes after initiating PO intake, patient had delayed cough response which was non-productive and suspicious for esophageal phase dysphagia. Cough subsided after 1-2 minutes and patient did report she has had reflux "for a while". SLP remained in room for another 5 minutes and no further coughing observed.SLP recommending Dys 3 solids, thin liquids, full supervision, reflux precautions.   HPI HPI: Pt is a 81 y.o. female who presented to the ED at [facility] 2/4 with severe abdominal pain, nausea and dry heaving. She was found to have leukocytosis, lactic acid elevation. Acute metabolic encephalopathy, suspected due to cefepime and peritonitis, cefepime discontinued.  AMS worse 2/9, CT head without acute abnormalities, neurology consulted, having  myoclonic jerks, LTM EEG suggestive of toxic metabolic encephalopathy. Per MD order "Daughter reports patient appears to be choking when laying flat. Has had poor PO intake for past few days". BSE requested. PMH: ESRD on PD, HTN, HLD, spinal stenosis, and GERD.      SLP Plan  Continue with current plan of care      Recommendations for follow up therapy are one component of a multi-disciplinary discharge planning process, led by the attending physician.  Recommendations may be updated based on patient status, additional functional criteria and insurance authorization.    Recommendations  Diet recommendations: Dysphagia 3 (mechanical soft);Thin liquid Liquids provided via: Cup;Straw Medication Administration: Whole meds with puree Supervision: Patient able to self feed;Full supervision/cueing for compensatory strategies Compensations: Minimize environmental distractions;Slow rate;Small sips/bites Postural Changes and/or Swallow Maneuvers: Seated upright 90 degrees;Upright 30-60 min after meal                Oral Care Recommendations: Oral care BID Follow Up Recommendations: Other (comment) (TBD) Assistance recommended at discharge: Frequent or constant Supervision/Assistance SLP Visit Diagnosis: Dysphagia, unspecified (R13.10) Plan: Continue with current plan of care          Sonia Baller, MA, CCC-SLP Speech Therapy

## 2021-09-03 NOTE — Progress Notes (Signed)
Inpatient Rehab Admissions: ° °Inpatient Rehab Consult received.  I met with patient at the bedside for rehabilitation assessment and to discuss goals and expectations of an inpatient rehab admission.  Pt appeared somewhat confused. Pt gave permission to contact daughter, Pam. Spoke with Pam on the telephone. Pam acknowledged understanding of CIR goals and expectations. Pam interested in pt pursuing CIR. Pam confirmed that she and other family members will be able to provide supervision/assistance for pt after discharge. Will continue to follow. ° °Signed: ° Graves Madden, MS, CCC-SLP °Admissions Coordinator °[phone] ° ° °

## 2021-09-03 NOTE — Progress Notes (Signed)
Repositioned pt in bed and then attempted mouth care but pt began gagging and coughing profusely, repeatedly saying "Oh God". Cool wash cloth applied to forehead, HOB elevated completely and attempted to suction mouth without success. Zofran IV given per orders.

## 2021-09-03 NOTE — Progress Notes (Addendum)
PROGRESS NOTE   Felicia Williams  BZJ:[id number]    DOB: 1941-05-07    DOA: 08/26/2021  PCP: [name], NP   I have briefly reviewed patients previous medical records in [facility].  Chief Complaint  Patient presents with   Abdominal Pain    Hospital Course:  Felicia Williams is an 81 y.o. female with a history of ESRD on PD, HTN, HLD, spinal stenosis, and GERD who presented to the ED at [facility] 2/4 with severe abdominal pain, nausea and dry heaving. She was found to have leukocytosis, lactic acid elevation. CT abd/pelvis revealed no acute intraabdominal findings. IV antibiotics were started for suspicion of bacterial peritonitis and the patient was admitted to [facility]. Peritoneal fluid appeared milky with neutrophil count 8,667 and GPC on gram stain.  Peritoneal fluid cultures remain negative to date.  Treating for PD associated peritonitis.  Acute metabolic encephalopathy, suspected due to cefepime and peritonitis, cefepime discontinued.  Nephrology on board.  AMS worse 2/9, CT head without acute abnormalities, neurology consulted, having myoclonic jerks, LTM EEG suggestive of toxic metabolic encephalopathy.  Mental status continues to gradually improve.NPO not due to mechanical dysphagia but due to AMS and not triggering swallow consistently.  Mental status even better today.   Assessment & Plan:  Principal Problem:   Peritonitis associated with peritoneal dialysis [facility]) Active Problems:   Acute metabolic encephalopathy   Hypokalemia   Macrocytic anemia   Essential hypertension, benign   End stage renal disease (HCC)   Gastroesophageal reflux disease   N&V (nausea and vomiting)   Sepsis (HCC)   Protein-calorie malnutrition, moderate (HCC)   Pruritus   Shortness of breath   Assessment and Plan: * Peritonitis associated with peritoneal dialysis ([place])- (present on admission) Sepsis due to peritonitis, POA. Peritoneal dialysis-associated bacterial peritonitis -Treated with  broad antibiotic coverage pending blood culture (negative, final report) peritoneal fluid culture NGTD (sent 2/4). Gram stain +GPC and milky peritoneal fluid.  - Continuing vancomycin.  Cefepime discontinued due to concern for AMS then switched over to cefazolin on 2/8.  As per nephrology, both of these can be given across dialysis.  Acute metabolic encephalopathy Suspected multifactorial due to cefepime (primary etiology), sepsis from peritonitis, hospital delirium. Delirium precautions.  Minimize sedatives and opioids-all discontinued 2/9. Cefepime discontinued.  Last dose was on 2/7. Myoclonic jerks noted by family and neurology on 2/9 CT head without acute abnormalities.  Ammonia level: 32. Suspected delayed clearance of cefepime due to ESRD. Per nephrology, unlikely from ESRD/uremia. Neurology input appreciated, LTM EEG indicative of severe toxic/metabolic encephalopathy as opposed to a seizure pattern. B12: 827, TSH 0.865 and RPR nonreactive. SLP consulted due to?  Aspiration episode on 2/9.  As per speech therapy input 2/11, do not suspect mechanical dysphagia but more due to AMS and patient not triggering swallow consistently. NPO. SLP will continue to follow. Mental status continues to improve.  This is the most responsive that she has been over the last 5 days. Although cefepime neurotoxicity is strongly suspected, since her degree of aphasia seemed out of proportion to her encephalopathy, neurology attempted an MRI yesterday but patient could not tolerate.  Hypokalemia Continue to follow and replace as needed.  Replacing IV while she is n.p.o. Magnesium 2.4  Macrocytic anemia- (present on admission) Hemoglobin stable in the low 7 g range. Follow CBC closely and transfuse if hemoglobin 7 g or less. Hemoglobin in the 8.5- 8.8 range over the last 2 days. B12 normal, ferritin 1,857 and folate 45.6.  Essential hypertension, benign- (present on admission) Since NPO, switched  carvedilol to metoprolol 5 mg IV every 8 hours. Continue as needed IV labetalol. When able switch back to carvedilol 12.5 Mg twice daily. Mildly uncontrolled but reasonable.  End stage renal disease ([place])- (present on admission) Nephrology consulted for routine PD. Discussed with Dr. Royce Macadamia 2/8, 2/9 at bedside.  Gastroesophageal reflux disease- (present on admission) Continue PPI  Shortness of breath Negative acute CXR. Atelectasis likely contributing. No hypoxia or chest pain. At risk of reflux and on PPI.  No dyspnea reported and is not hypoxic.  Pruritus- (present on admission) Moisturizing lotion ordered. TBili wnl, BUN not severely/acutely elevated.   Protein-calorie malnutrition, moderate ([place])- (present on admission) Supplement protein as able. Hypoalbuminemia noted at 2.2.   Sepsis ([place])- (present on admission) Tachycardia, leukocytosis, lactic acidosis with a suspected source of peritonitis.  Management as noted above Sepsis features have resolved.  N&V (nausea and vomiting)- (present on admission) Due to peritonitis Antiemetic prn Seems to have resolved.  However has significantly diminished appetite.  Dietitian consulted yesterday and has liberalized diet choices.   Body mass index is 24.45 kg/m.  Nutritional Status Nutrition Problem: Increased nutrient needs Etiology: acute illness Signs/Symptoms: estimated needs Interventions: MVI, Ensure Enlive (each supplement provides 350kcal and 20 grams of protein), Prostat, Liberalize Diet  Pressure Ulcer:     DVT prophylaxis: heparin injection 5,000 Units Start: 08/27/21 0600     Code Status: Full Code:  Family Communication: I discussed with patient's daughter via phone.  Updated that patient's MRI brain did not have any acute findings or stroke.  She indicated that patient's mental status has improved by more than 50%.  Updated care and answered all questions. Disposition:  Status is: Inpatient Remains  inpatient appropriate because: Severity of illness     Consultants:   Nephrology Neurology  Procedures:   Peritoneal dialysis LTM EEG  Antimicrobials:   As noted above   Subjective:  Patient looks much better this morning.  Sitting up in bed.  Smiling as I walked into the room.  More responsive/interactive.  Responded by saying good morning.  Also told me her name.  Still having some crepitations but less so. Objective:   Vitals:   09/02/21 1927 09/02/21 1928 09/02/21 2303 09/03/21 0404  BP: (!) 175/61 (!) 173/72 (!) 157/75 (!) 156/79  Pulse: 79 84 80   Resp: 18 16 19 14   Temp:  98.2 F (36.8 C) 98.2 F (36.8 C) 99.1 F (37.3 C)  TempSrc:  Oral Oral Oral  SpO2: 98% 97% 94% 96%  Weight:    54.9 kg  Height:        General exam: Elderly female, moderately built and nourished sitting up comfortably in bed without distress.  This is the best that she has looked in the last 5 days. Respiratory system: Clear to auscultation.  No increased work of breathing. Cardiovascular system: S1 and S2 heard, RRR.  No JVD, murmurs or pedal edema.  Telemetry personally reviewed: Sinus rhythm. Gastrointestinal system: Abdomen is nondistended, soft, nontender.  PD catheter site without acute findings.  Normal bowel sounds heard. Central nervous system: Alert and oriented to self.  Able to respond to more questions now from me, nephrology and patient's RN.  No focal neurological deficits.  Still has some repetition of words.  Noted mild bilateral upper extremity myoclonic jerks. Extremities: Symmetric 5 x 5 power. Skin: No rashes, lesions or ulcers Psychiatry: Judgement and insight are impaired. Mood & affect pleasant.  Data Reviewed:   I have personally reviewed following labs and imaging studies   CBC: Recent Labs  Lab 08/30/21 0217 08/31/21 0108 09/01/21 0326  WBC 6.9 8.6 9.6  HGB 7.3* 8.5* 8.8*  HCT 21.3* 24.2* 25.0*  MCV 102.4* 103.4* 102.9*  PLT 240 266 296    Basic  Metabolic Panel: Recent Labs  Lab 08/30/21 0217 08/31/21 0108 09/01/21 0326 09/02/21 0311 09/03/21 0346  NA 133* 136 137 138 138  K 2.9* 3.2* 3.3* 3.7 3.4*  CL 93* 97* 98 98 97*  CO2 24 24 24 24 22   GLUCOSE 157* 136* 130* 136* 138*  BUN 62* 66* 60* 62* 61*  CREATININE 9.29* 9.72* 10.17* 10.82* 11.10*  CALCIUM 8.3* 8.9 9.3 9.5 9.6  MG  --   --   --   --  2.4  PHOS 5.8* 5.4* 6.2* 6.3* 7.1*    Liver Function Tests: Recent Labs  Lab 08/30/21 0217 08/31/21 0108 09/01/21 0326 09/02/21 0311 09/03/21 0346  ALBUMIN 2.0* 2.0* 2.1* 2.2* 2.4*    CBG: Recent Labs  Lab 08/27/21 1058 08/27/21 1516  GLUCAP 95 97    Microbiology Studies:   Recent Results (from the past 240 hour(s))  Blood culture (routine x 2)     Status: None   Collection Time: 08/26/21  8:39 PM   Specimen: Left Antecubital; Blood  Result Value Ref Range Status   Specimen Description LEFT ANTECUBITAL  Final   Special Requests   Final    BOTTLES DRAWN AEROBIC AND ANAEROBIC Blood Culture results may not be optimal due to an excessive volume of blood received in culture bottles   Culture   Final    NO GROWTH 5 DAYS Performed at [facility], [address], [place], [place] [address]    Report Status 08/31/2021 FINAL  Final  Blood culture (routine x 2)     Status: None   Collection Time: 08/26/21  8:46 PM   Specimen: BLOOD LEFT HAND  Result Value Ref Range Status   Specimen Description BLOOD LEFT HAND  Final   Special Requests   Final    BOTTLES DRAWN AEROBIC AND ANAEROBIC Blood Culture results may not be optimal due to an excessive volume of blood received in culture bottles   Culture   Final    NO GROWTH 5 DAYS Performed at [facility], [address] [place], [place] [address]    Report Status 08/31/2021 FINAL  Final  Gram stain     Status: None   Collection Time: 08/26/21  9:12 PM   Specimen: Peritoneal Washings  Result Value Ref Range Status   Specimen Description PERITONEAL FLUID  Final    Special Requests NONE  Final   Gram Stain   Final    WBC PRESENT, PREDOMINANTLY PMN GRAM POSITIVE COCCI CYTOSPIN SMEAR Performed at [facility], [place] [address], [place], [place] [address]    Report Status 08/27/2021 FINAL  Final  Culture, body fluid w Gram Stain-bottle     Status: None   Collection Time: 08/26/21  9:12 PM   Specimen: Peritoneal Washings  Result Value Ref Range Status   Specimen Description PERITONEAL FLUID  Final   Special Requests NONE  Final   Culture   Final    NO GROWTH 5 DAYS Performed at [place] [address], [place],  [address]    Report Status 09/01/2021 FINAL  Final  Resp Panel by RT-PCR (Flu A&B, Covid) Nasopharyngeal Swab  Status: None   Collection Time: 08/26/21  9:14 PM   Specimen: Nasopharyngeal Swab; Nasopharyngeal(NP) swabs in vial transport medium  Result Value Ref Range Status   SARS Coronavirus 2 by RT PCR NEGATIVE NEGATIVE Final    Comment: (NOTE) SARS-CoV-2 target nucleic acids are NOT DETECTED.  The SARS-CoV-2 RNA is generally detectable in upper respiratory specimens during the acute phase of infection. The lowest concentration of SARS-CoV-2 viral copies this assay can detect is 138 copies/mL. A negative result does not preclude SARS-Cov-2 infection and should not be used as the sole basis for treatment or other patient management decisions. A negative result may occur with  improper specimen collection/handling, submission of specimen other than nasopharyngeal swab, presence of viral mutation(s) within the areas targeted by this assay, and inadequate number of viral copies(<138 copies/mL). A negative result must be combined with clinical observations, patient history, and epidemiological information. The expected result is Negative.  Fact Sheet for Patients:  EntrepreneurPulse.com.au  Fact Sheet for Healthcare Providers:  IncredibleEmployment.be  This test is no t  yet approved or cleared by the [place] FDA and  has been authorized for detection and/or diagnosis of SARS-CoV-2 by FDA under an Emergency Use Authorization (EUA). This EUA will remain  in effect (meaning this test can be used) for the duration of the COVID-19 declaration under Section 564(b)(1) of the Act, 21 U.S.C.section 360bbb-3(b)(1), unless the authorization is terminated  or revoked sooner.       Influenza A by PCR NEGATIVE NEGATIVE Final   Influenza B by PCR NEGATIVE NEGATIVE Final    Comment: (NOTE) The Xpert Xpress SARS-CoV-2/FLU/RSV plus assay is intended as an aid in the diagnosis of influenza from Nasopharyngeal swab specimens and should not be used as a sole basis for treatment. Nasal washings and aspirates are unacceptable for Xpert Xpress SARS-CoV-2/FLU/RSV testing.  Fact Sheet for Patients: EntrepreneurPulse.com.au  Fact Sheet for Healthcare Providers: IncredibleEmployment.be  This test is not yet approved or cleared by the [place] FDA and has been authorized for detection and/or diagnosis of SARS-CoV-2 by FDA under an Emergency Use Authorization (EUA). This EUA will remain in effect (meaning this test can be used) for the duration of the COVID-19 declaration under Section 564(b)(1) of the Act, 21 U.S.C. section 360bbb-3(b)(1), unless the authorization is terminated or revoked.  Performed at [facility], [address], [place], [place] [address]   MRSA Next Gen by PCR, Nasal     Status: None   Collection Time: 08/27/21 10:37 AM   Specimen: Nasal Mucosa; Nasal Swab  Result Value Ref Range Status   MRSA by PCR Next Gen NOT DETECTED NOT DETECTED Final    Comment: (NOTE) The GeneXpert MRSA Assay (FDA approved for NASAL specimens only), is one component of a comprehensive MRSA colonization surveillance program. It is not intended to diagnose MRSA infection nor to guide or monitor treatment for MRSA  infections. Test performance is not FDA approved in patients less than 61 years old. Performed at [facility], [place] [address] [place], [place] [address]     Radiology Studies:  No results found.  Scheduled Meds:    (feeding supplement) PROSource Plus  30 mL Oral BID BM   feeding supplement  237 mL Oral BID BM   gentamicin cream  1 application Topical Daily   gentamicin cream  1 application Topical Daily   heparin  5,000 Units Subcutaneous Q8H   metoprolol tartrate  5 mg Intravenous Q8H   multivitamin  1  tablet Oral QHS   pantoprazole (PROTONIX) IV  40 mg Intravenous Q24H   polyethylene glycol  17 g Oral Once   sevelamer carbonate  2.4 g Oral TID WC    Continuous Infusions:    sodium chloride Stopped (09/01/21 1220)    ceFAZolin (ANCEF) IV 1 g (09/02/21 2042)   dialysis solution 2.5% low-MG/low-CA     famotidine (PEPCID) IV 20 mg (09/02/21 1055)   potassium chloride 10 mEq (09/03/21 0808)     LOS: 8 days     Vernell Leep, MD,  FACP, [facility], [facility], [facility] (Care Management Physician Certified) [place]  To contact the attending provider between 7A-7P or the covering provider during after hours 7P-7A, please log into the web site [email or web address] and access using universal [place] password for that web site. If you do not have the password, please call the hospital operator.  09/03/2021, 8:41 AM

## 2021-09-03 NOTE — Progress Notes (Signed)
Subjective: Slightly more interactive, but still quite encephalopathic  Exam: Vitals:   09/02/21 2303 09/03/21 0404  BP: (!) 157/75 (!) 156/79  Pulse: 80   Resp: 19 14  Temp: 98.2 F (36.8 C) 99.1 F (37.3 C)  SpO2: 94% 96%   Gen: In bed, NAD Resp: non-labored breathing, no acute distress Abd: soft, nt  Neuro: MS: awake, responds "good morning."  She is able to tell me her name, but responses are significantly delayed, and she has motor perseveration. CN: Pupils are equal round and reactive to light, she responds to visual stimuli in both hemifield's, face is symmetric Motor: She has prominent asterixis in both the arms and legs Sensory: Reports intact sensation to light touch   Pertinent Labs: BUN 61  Impression: 81 year old female with persistent encephalopathy with cephalosporin related neurotoxicity strongly suspected.  She continues to seem aphasic out of proportion to her encephalopathy, and therefore I would still favor getting an MRI for able.  I laid her flat today, and she seemed able to tolerate it.  Though cefazolin is not as common because of cephalosporin related neurotoxicity, it is well described and given her latency in improvement, I would favor changing to a noncephalosporin if feasible.  Recommendations: 1) consider changing from Ancef to another noncephalosporin agent if possible 2) reattempt MRI brain, will order Ativan 1 mg prior to MRI 3) neurology will follow  Roland Rack, MD [facility] [phone]  If 7pm- 7am, please page neurology on call as listed in [place].

## 2021-09-04 DIAGNOSIS — R410 Disorientation, unspecified: Secondary | ICD-10-CM

## 2021-09-04 LAB — CBC
HCT: 27.6 % — ABNORMAL LOW (ref 36.0–46.0)
Hemoglobin: 8.9 g/dL — ABNORMAL LOW (ref 12.0–15.0)
MCH: 35.2 pg — ABNORMAL HIGH (ref 26.0–34.0)
MCHC: 32.2 g/dL (ref 30.0–36.0)
MCV: 109.1 fL — ABNORMAL HIGH (ref 80.0–100.0)
Platelets: 325 10*3/uL (ref 150–400)
RBC: 2.53 MIL/uL — ABNORMAL LOW (ref 3.87–5.11)
RDW: 13.9 % (ref 11.5–15.5)
WBC: 10 10*3/uL (ref 4.0–10.5)
nRBC: 0.4 % — ABNORMAL HIGH (ref 0.0–0.2)

## 2021-09-04 LAB — RENAL FUNCTION PANEL
Albumin: 2.4 g/dL — ABNORMAL LOW (ref 3.5–5.0)
Anion gap: 16 — ABNORMAL HIGH (ref 5–15)
BUN: 60 mg/dL — ABNORMAL HIGH (ref 8–23)
CO2: 25 mmol/L (ref 22–32)
Calcium: 9.6 mg/dL (ref 8.9–10.3)
Chloride: 99 mmol/L (ref 98–111)
Creatinine, Ser: 11.46 mg/dL — ABNORMAL HIGH (ref 0.44–1.00)
GFR, Estimated: 3 mL/min — ABNORMAL LOW (ref >60)
Glucose, Bld: 116 mg/dL — ABNORMAL HIGH (ref 70–99)
Phosphorus: 6.6 mg/dL — ABNORMAL HIGH (ref 2.5–4.6)
Potassium: 3.5 mmol/L (ref 3.5–5.1)
Sodium: 140 mmol/L (ref 135–145)

## 2021-09-04 MED ORDER — DARBEPOETIN ALFA 100 MCG/0.5ML IJ SOSY
100.0000 ug | PREFILLED_SYRINGE | INTRAMUSCULAR | Status: DC
  Filled 2021-09-04: qty 0.5

## 2021-09-04 MED ORDER — SODIUM CHLORIDE 0.9 % IV SOLN
500.0000 mg | INTRAVENOUS | Status: DC
  Administered 2021-09-04 – 2021-09-07 (×4): 500 mg via INTRAVENOUS
  Filled 2021-09-04 (×4): qty 10

## 2021-09-04 MED ORDER — FAMOTIDINE 20 MG PO TABS
20.0000 mg | ORAL_TABLET | Freq: Every day | ORAL | Status: DC
  Administered 2021-09-04 – 2021-09-07 (×4): 20 mg via ORAL
  Filled 2021-09-04 (×4): qty 1

## 2021-09-04 MED ORDER — PANTOPRAZOLE SODIUM 40 MG PO TBEC
40.0000 mg | DELAYED_RELEASE_TABLET | Freq: Every day | ORAL | Status: DC
  Administered 2021-09-04 – 2021-09-07 (×4): 40 mg via ORAL
  Filled 2021-09-04 (×4): qty 1

## 2021-09-04 MED ORDER — CARVEDILOL 12.5 MG PO TABS
12.5000 mg | ORAL_TABLET | Freq: Two times a day (BID) | ORAL | Status: DC
  Administered 2021-09-04 – 2021-09-07 (×7): 12.5 mg via ORAL
  Filled 2021-09-04 (×8): qty 1

## 2021-09-04 NOTE — Progress Notes (Addendum)
Subjective: Daughter at bedside. Much more interactive. Able to converse appropriately. Patient states she is sleepy. Was agitated last night and required Haldol 2 mg last evening. Daughter reports patient becomes more confused at night.   Exam: Vitals:   09/04/21 0900 09/04/21 1142  BP:  (!) 133/52  Pulse:  75  Resp:  13  Temp: 98.1 F (36.7 C) (!) 97.3 F (36.3 C)  SpO2:  97%   Gen: In bed, NAD Resp: non-labored breathing, no acute distress Abd: soft, nt  Neuro: MS: awake, some latency in response but more alert and oriented. CN: Pupils are equal round and reactive to light, she responds to visual stimuli in both hemifield's, face is symmetric Motor: She has prominent asterixis in both the arms and legs Sensory: Reports intact sensation to light touch   Pertinent Labs: BUN 60  Impression: 81 year old female with persistent encephalopathy with cephalosporin related neurotoxicity strongly suspected.  She is much less aphasic today. Plan for CIR pending insurance auth. Changed from ancef to meropenem to avoid any cephalosporin neurotoxicity. MRI shows no acute findings.   Recommendations: 1) CIR pending insurance auth 2) Delirium precautions. If mental status continues to wax and wane, may benefit from scheduled dose of Seroquel at night.  3) Neurology will sign off. Please reach out for any further questions.  4) can f/u outpt neurology.  France Ravens, MD PGY1 Resident  If 7pm- 7am, please page neurology on call as listed in [place].  ATTENDING ATTESTATION:  Pt with likely encephalopathy secondary to abx. Improving. MRI neg. On exam she is alert, awake and oriented.Will sign off.   Dr. Reeves Forth evaluated pt independently, reviewed imaging, chart, labs. Discussed and formulated plan with the resident. Please see note above for details.   Total 36 minutes spent on counseling patient and coordinating care, writing notes and reviewing chart.   Nashali Ditmer,MD

## 2021-09-04 NOTE — Care Management Important Message (Signed)
Important Message  Patient Details  Name: Felicia Williams MRN: [id number] Date of Birth: January 06, 1941   Medicare Important Message Given:  Yes     Shelda Altes 09/04/2021, 8:23 AM

## 2021-09-04 NOTE — Progress Notes (Signed)
PROGRESS NOTE   Felicia Williams  FIE:[id number]    DOB: 05-Mar-1941    DOA: 08/26/2021  PCP: [name], NP   I have briefly reviewed patients previous medical records in [facility].  Chief Complaint  Patient presents with   Abdominal Pain    Hospital Course:  Felicia Williams is an 81 y.o. female with a history of ESRD on PD, HTN, HLD, spinal stenosis, and GERD who presented to the ED at [facility] 2/4 with severe abdominal pain, nausea and dry heaving. She was found to have leukocytosis, lactic acid elevation. CT abd/pelvis revealed no acute intraabdominal findings. IV antibiotics were started for suspicion of bacterial peritonitis and the patient was admitted to [facility]. Peritoneal fluid appeared milky with neutrophil count 8,667 and GPC on gram stain.  Peritoneal fluid cultures remain negative to date.  Treating for PD associated peritonitis.  Acute metabolic encephalopathy, suspected due to cefepime and peritonitis, cefepime discontinued.  Nephrology on board.  AMS worse 2/9, CT head without acute abnormalities, neurology consulted, having myoclonic jerks, LTM EEG suggestive of toxic metabolic encephalopathy.  Mental status has significantly improved.  All cephalosporins discontinued due to some risk of neurotoxicity.  Meropenem initiated 2/13.  CIR evaluating.  Hopefully will be ready for DC 2/14.   Assessment & Plan:  Principal Problem:   Peritonitis associated with peritoneal dialysis [facility]) Active Problems:   Acute metabolic encephalopathy   Hypokalemia   Macrocytic anemia   Essential hypertension, benign   End stage renal disease (HCC)   Gastroesophageal reflux disease   N&V (nausea and vomiting)   Sepsis (HCC)   Pruritus   Shortness of breath   Assessment and Plan: * Peritonitis associated with peritoneal dialysis ([place])- (present on admission) Sepsis due to peritonitis, POA. Peritoneal dialysis-associated bacterial peritonitis.  Culture negative peritonitis but this may  be related to getting antibiotics prior to culture. Blood culture (negative, final report) peritoneal fluid culture NGTD. Gram stain +GPC and milky peritoneal fluid.  - Continuing vancomycin.  Cefepime discontinued due to concern for AMS then switched over to cefazolin on 2/8.  However as recommended by neurology, all cephalosporins can carry some risk of neurotoxicity and hence recommended discontinuing cephalosporins and using alternate agent. Cefazolin switched to meropenem 2/13. Complete 2 weeks course of vancomycin and meropenem.  Acute metabolic encephalopathy Suspected multifactorial due to cefepime (primary etiology), sepsis from peritonitis, hospital delirium. Delirium precautions.  Minimize sedatives and opioids-all discontinued 2/9.  Cefepime followed by cefazolin discontinued. Had some myoclonic jerks early on during encephalopathy. CT head and MRI brain without acute abnormalities.  Ammonia level: 32. Per nephrology, unlikely from ESRD/uremia. Neurology input appreciated, LTM EEG indicative of severe toxic/metabolic encephalopathy as opposed to a seizure pattern. B12: 827, TSH 0.865 and RPR nonreactive. SLP consulted for dysphagia.  Currently on dysphagia 3 diet and thin liquids.  Dysphagia mostly due to AMS rather than mechanical etiology. Mental status has significantly improved.  Did have some visual hallucinations, agitation and paranoia last night and did get a dose of IV Haldol.  Monitor for additional 24 hours.  Hypokalemia Replaced.  Magnesium 2.4.  Macrocytic anemia- (present on admission) B12 normal, ferritin 1,857 and folate 45.6. Hemoglobin stable in the 8.5- 8.9 range for the last few days.  Essential hypertension, benign- (present on admission) Since tolerating p.o., switched back antihypertensives to oral. Continue carvedilol. Controlled.  End stage renal disease ([place])- (present on admission) Nephrology continue to follow.  Gastroesophageal reflux  disease- (present on admission) Continue PPI and H2  blockers, prior home meds.  Shortness of breath Negative acute CXR. Atelectasis likely contributing.  Resolved.  Pruritus- (present on admission) Moisturizing lotion ordered. TBili wnl, BUN not severely/acutely elevated.   Sepsis ([place])- (present on admission) Sepsis has resolved.  N&V (nausea and vomiting)- (present on admission) Resolved.   Body mass index is 24.49 kg/m.  Nutritional Status Nutrition Problem: Increased nutrient needs Etiology: acute illness Signs/Symptoms: estimated needs Interventions: MVI, Ensure Enlive (each supplement provides 350kcal and 20 grams of protein), Prostat, Liberalize Diet     DVT prophylaxis: heparin injection 5,000 Units Start: 08/27/21 0600     Code Status: Full Code:  Family Communication: Discussed extensively with daughter at bedside.  Neurology team was in the room. Disposition:  Status is: Inpatient Remains inpatient appropriate because: Improving AMS, hopefully should be ready for DC 2/14.     Consultants:   Nephrology Neurology  Procedures:   Peritoneal dialysis LTM EEG  Antimicrobials:   As noted above   Subjective:  Patient alert and oriented to self, person, place and partly to time.  More interactive and communicative.  Aware that she is in the hospital due to "something was wrong with my head".  As per daughter at bedside, did quite well in the daytime yesterday but towards the evening developed worsening visual hallucinations, paranoia and agitation.  Did get a dose of IV Haldol.  This morning improved but still not as good as she was yesterday morning.   Objective:   Vitals:   09/04/21 0422 09/04/21 0729 09/04/21 0900 09/04/21 1142  BP: (!) 148/60 (!) 94/50  (!) 133/52  Pulse: 62 68  75  Resp: 14 17  13   Temp: 98.3 F (36.8 C)  98.1 F (36.7 C) (!) 97.3 F (36.3 C)  TempSrc: Oral  Oral Oral  SpO2: 97% 92%  97%  Weight: 55 kg     Height:         General exam: Elderly female, moderately built and nourished sitting up comfortably in bed without distress.  Has progressively improved over the last several days.  This is the best that I have seen her in the last 6 days. Respiratory system: Clear to auscultation.  No increased work of breathing. Cardiovascular system: S1 and S2 heard, RRR.  No JVD, murmurs or pedal edema.  Telemetry personally reviewed: Sinus rhythm. Gastrointestinal system: Abdomen is nondistended, soft, nontender.  PD catheter site without acute findings.  Normal bowel sounds heard. Central nervous system: Mental status as noted above.  No focal neurological deficits.  No myoclonic jerks noted. Extremities: Symmetric 5 x 5 power. Skin: No rashes, lesions or ulcers Psychiatry: Judgement and insight improved. Mood & affect pleasant and appropriate.    Data Reviewed:   I have personally reviewed following labs and imaging studies   CBC: Recent Labs  Lab 08/31/21 0108 09/01/21 0326 09/04/21 0413  WBC 8.6 9.6 10.0  HGB 8.5* 8.8* 8.9*  HCT 24.2* 25.0* 27.6*  MCV 103.4* 102.9* 109.1*  PLT 266 296 202    Basic Metabolic Panel: Recent Labs  Lab 08/31/21 0108 09/01/21 0326 09/02/21 0311 09/03/21 0346 09/04/21 0413  NA 136 137 138 138 140  K 3.2* 3.3* 3.7 3.4* 3.5  CL 97* 98 98 97* 99  CO2 24 24 24 22 25   GLUCOSE 136* 130* 136* 138* 116*  BUN 66* 60* 62* 61* 60*  CREATININE 9.72* 10.17* 10.82* 11.10* 11.46*  CALCIUM 8.9 9.3 9.5 9.6 9.6  MG  --   --   --  2.4  --   PHOS 5.4* 6.2* 6.3* 7.1* 6.6*    Liver Function Tests: Recent Labs  Lab 08/31/21 0108 09/01/21 0326 09/02/21 0311 09/03/21 0346 09/04/21 0413  ALBUMIN 2.0* 2.1* 2.2* 2.4* 2.4*    CBG: No results for input(s): GLUCAP in the last 168 hours.   Microbiology Studies:   Recent Results (from the past 240 hour(s))  Blood culture (routine x 2)     Status: None   Collection Time: 08/26/21  8:39 PM   Specimen: Left Antecubital; Blood   Result Value Ref Range Status   Specimen Description LEFT ANTECUBITAL  Final   Special Requests   Final    BOTTLES DRAWN AEROBIC AND ANAEROBIC Blood Culture results may not be optimal due to an excessive volume of blood received in culture bottles   Culture   Final    NO GROWTH 5 DAYS Performed at [facility], [address] [place], [place] [address]    Report Status 08/31/2021 FINAL  Final  Blood culture (routine x 2)     Status: None   Collection Time: 08/26/21  8:46 PM   Specimen: BLOOD LEFT HAND  Result Value Ref Range Status   Specimen Description BLOOD LEFT HAND  Final   Special Requests   Final    BOTTLES DRAWN AEROBIC AND ANAEROBIC Blood Culture results may not be optimal due to an excessive volume of blood received in culture bottles   Culture   Final    NO GROWTH 5 DAYS Performed at [facility], [address] [place], [place] [address]    Report Status 08/31/2021 FINAL  Final  Gram stain     Status: None   Collection Time: 08/26/21  9:12 PM   Specimen: Peritoneal Washings  Result Value Ref Range Status   Specimen Description PERITONEAL FLUID  Final   Special Requests NONE  Final   Gram Stain   Final    WBC PRESENT, PREDOMINANTLY PMN GRAM POSITIVE COCCI CYTOSPIN SMEAR Performed at [facility], [place] [address] [place], [place] [address]    Report Status 08/27/2021 FINAL  Final  Culture, body fluid w Gram Stain-bottle     Status: None   Collection Time: 08/26/21  9:12 PM   Specimen: Peritoneal Washings  Result Value Ref Range Status   Specimen Description PERITONEAL FLUID  Final   Special Requests NONE  Final   Culture   Final    NO GROWTH 5 DAYS Performed at [facility] [address] [place], [place] [address]    Report Status 09/01/2021 FINAL  Final  Resp Panel by RT-PCR (Flu A&B, Covid) Nasopharyngeal Swab     Status: None   Collection Time: 08/26/21  9:14 PM   Specimen: Nasopharyngeal Swab; Nasopharyngeal(NP) swabs in vial transport  medium  Result Value Ref Range Status   SARS Coronavirus 2 by RT PCR NEGATIVE NEGATIVE Final    Comment: (NOTE) SARS-CoV-2 target nucleic acids are NOT DETECTED.  The SARS-CoV-2 RNA is generally detectable in upper respiratory specimens during the acute phase of infection. The lowest concentration of SARS-CoV-2 viral copies this assay can detect is 138 copies/mL. A negative result does not preclude SARS-Cov-2 infection and should not be used as the sole basis for treatment or other patient management decisions. A negative result may occur with  improper specimen collection/handling, submission of specimen other than nasopharyngeal swab, presence of viral mutation(s) within the areas targeted by this assay, and inadequate number of viral copies(<138 copies/mL).  A negative result must be combined with clinical observations, patient history, and epidemiological information. The expected result is Negative.  Fact Sheet for Patients:  EntrepreneurPulse.com.au  Fact Sheet for Healthcare Providers:  IncredibleEmployment.be  This test is no t yet approved or cleared by the [place] FDA and  has been authorized for detection and/or diagnosis of SARS-CoV-2 by FDA under an Emergency Use Authorization (EUA). This EUA will remain  in effect (meaning this test can be used) for the duration of the COVID-19 declaration under Section 564(b)(1) of the Act, 21 U.S.C.section 360bbb-3(b)(1), unless the authorization is terminated  or revoked sooner.       Influenza A by PCR NEGATIVE NEGATIVE Final   Influenza B by PCR NEGATIVE NEGATIVE Final    Comment: (NOTE) The Xpert Xpress SARS-CoV-2/FLU/RSV plus assay is intended as an aid in the diagnosis of influenza from Nasopharyngeal swab specimens and should not be used as a sole basis for treatment. Nasal washings and aspirates are unacceptable for Xpert Xpress SARS-CoV-2/FLU/RSV testing.  Fact Sheet for  Patients: EntrepreneurPulse.com.au  Fact Sheet for Healthcare Providers: IncredibleEmployment.be  This test is not yet approved or cleared by the [place] FDA and has been authorized for detection and/or diagnosis of SARS-CoV-2 by FDA under an Emergency Use Authorization (EUA). This EUA will remain in effect (meaning this test can be used) for the duration of the COVID-19 declaration under Section 564(b)(1) of the Act, 21 U.S.C. section 360bbb-3(b)(1), unless the authorization is terminated or revoked.  Performed at [facility], [address] [place], [place] [address]   MRSA Next Gen by PCR, Nasal     Status: None   Collection Time: 08/27/21 10:37 AM   Specimen: Nasal Mucosa; Nasal Swab  Result Value Ref Range Status   MRSA by PCR Next Gen NOT DETECTED NOT DETECTED Final    Comment: (NOTE) The GeneXpert MRSA Assay (FDA approved for NASAL specimens only), is one component of a comprehensive MRSA colonization surveillance program. It is not intended to diagnose MRSA infection nor to guide or monitor treatment for MRSA infections. Test performance is not FDA approved in patients less than 50 years old. Performed at [facility], [place] [address], [place], [place] [address]     Radiology Studies:  MR BRAIN WO CONTRAST  Result Date: 09/03/2021 CLINICAL DATA:  Neuro deficit with acute stroke suspected EXAM: MRI HEAD WITHOUT CONTRAST TECHNIQUE: Multiplanar, multiecho pulse sequences of the brain and surrounding structures were obtained without intravenous contrast. COMPARISON:  Head CT from 3 days ago FINDINGS: Brain: No acute infarction, hemorrhage, hydrocephalus, extra-axial collection or mass lesion. Mild for age chronic small vessel ischemia in the hemispheric white matter. Age normal brain volume. Vascular: Preserved flow voids Skull and upper cervical spine: Unremarkable marrow signal. Sinuses/Orbits: Left sphenoid sinus opacification  by inspissated material with sclerotic wall thickening, chronic. Bilateral cataract resection IMPRESSION: No acute finding, including infarct. Unremarkable appearance of the brain for age. Electronically Signed   By: Jorje Guild M.D.   On: 09/03/2021 11:09    Scheduled Meds:    (feeding supplement) PROSource Plus  30 mL Oral BID BM   carvedilol  12.5 mg Oral BID WC   [START ON 09/05/2021] darbepoetin (ARANESP) injection - NON-DIALYSIS  100 mcg Subcutaneous Q Tue-1800   famotidine  20 mg Oral Daily   feeding supplement  237 mL Oral BID BM   gentamicin cream  1 application Topical Daily   heparin  5,000 Units Subcutaneous Q8H  multivitamin  1 tablet Oral QHS   pantoprazole  40 mg Oral Daily   sevelamer carbonate  2.4 g Oral TID WC    Continuous Infusions:    sodium chloride Stopped (09/01/21 1220)   dialysis solution 2.5% low-MG/low-CA     meropenem (MERREM) IV 500 mg (09/04/21 1208)     LOS: 9 days     Vernell Leep, MD,  FACP, [facility], [facility], [facility] (Care Management Physician Certified) [place]  To contact the attending provider between 7A-7P or the covering provider during after hours 7P-7A, please log into the web site [email or web address] and access using universal [place] password for that web site. If you do not have the password, please call the hospital operator.  09/04/2021, 1:07 PM

## 2021-09-04 NOTE — Progress Notes (Signed)
Speech Language Pathology Treatment: Dysphagia  Patient Details Name: Felicia Williams MRN: [id number] DOB: [birth year] Today's Date: 09/04/2021 Time: 7425-9563 SLP Time Calculation (min) (ACUTE ONLY): 16 min  Assessment / Plan / Recommendation Clinical Impression  Pt finishing breakfast; daughter at bedside.  No further episodes of coughing that have been unusual or concerning. PT's daughter reports hx of chronic cough, daily, with no obvious triggers.  Her swallow appears to be quite functional - there is adequate mastication, good attention, the appearance of a swift swallow with no s/s of aspiration. Moreover, lung sounds are clear.  Occasional coughing with PO intake is not unusual (and neither is occasional aspiration) and as long as there continue to be no adverse respiratory consequences, then the pt's cough is working as it should. Spoke with pt/daughter - recommend continuing current diet.  No further SLP f/u is needed. Our service will sign off.   HPI HPI: Pt is a 81 y.o. female who presented to the ED at [facility] 2/4 with severe abdominal pain, nausea and dry heaving. She was found to have leukocytosis, lactic acid elevation. Acute metabolic encephalopathy, suspected due to cefepime and peritonitis, cefepime discontinued.  AMS worse 2/9, CT head without acute abnormalities, neurology consulted, having myoclonic jerks, LTM EEG suggestive of toxic metabolic encephalopathy. Per MD order "Daughter reports patient appears to be choking when laying flat. Has had poor PO intake for past few days". BSE requested. PMH: ESRD on PD, HTN, HLD, spinal stenosis, and GERD.      SLP Plan  All goals met      Recommendations for follow up therapy are one component of a multi-disciplinary discharge planning process, led by the attending physician.  Recommendations may be updated based on patient status, additional functional criteria and insurance authorization.    Recommendations  Diet recommendations:  Dysphagia 3 (mechanical soft);Thin liquid Liquids provided via: Cup;Straw Medication Administration: Whole meds with puree Supervision: Patient able to self feed;Intermittent supervision to cue for compensatory strategies Compensations: Minimize environmental distractions Postural Changes and/or Swallow Maneuvers: Seated upright 90 degrees;Upright 30-60 min after meal                Oral Care Recommendations: Oral care BID Follow Up Recommendations: No SLP follow up Assistance recommended at discharge: PRN (for swallowing specifically) SLP Visit Diagnosis: Dysphagia, unspecified (R13.10) Plan: All goals met         Felicia Williams L. Tivis Ringer, [place] Office number [phone] Pager [phone]   Felicia Williams  09/04/2021, 9:15 AM

## 2021-09-04 NOTE — Progress Notes (Signed)
Physical Therapy Treatment Patient Details Name: Felicia Williams MRN: [id number] DOB: [birth year] Today's Date: 09/04/2021   History of Present Illness Pt is a 81 y.o. F who presents 08/26/2021 with severe abdominal pain, nausea, dry heaving. Found to have leukocytosis and lactic acid elevation. CT abd/pelvis revealed no acute intraabdominal findings. IV antibiotics started for suspicion of bacterial peritonitis. Significant PMH: ESRD on PD, HTN, HLD, spinal stenosis.    PT Comments    The pt was able to demo great progress with seated stability, OOB mobility, and initiation of gait since last PT session. The pt was OOB in recliner upon my arrival, and therefore we started with repeated sit-stand transfers from the recliner for LE strengthening and transfer training. The pt was able to progress from minA with each transfer to minG at times, but benefits from repeated cues for hand placement, posture, and hip extension. The pt was then challenged with short bout of ambulation in the room (14ft then 71ft) but had x1 instance of R knee bucking requiring modA to maintain upright and minA to return to safe seated position on bed. The pt is highly motivated to continue to progress back to full independence, continue to recommend acute inpatient rehab at d/c.     Recommendations for follow up therapy are one component of a multi-disciplinary discharge planning process, led by the attending physician.  Recommendations may be updated based on patient status, additional functional criteria and insurance authorization.  Follow Up Recommendations  Acute inpatient rehab (3hours/day)     Assistance Recommended at Discharge Frequent or constant Supervision/Assistance  Patient can return home with the following Two people to help with walking and/or transfers;Two people to help with bathing/dressing/bathroom;Assistance with feeding;Direct supervision/assist for financial management;Direct supervision/assist for  medications management;Help with stairs or ramp for entrance;Assist for transportation   Equipment Recommendations  BSC/3in1    Recommendations for Other Services       Precautions / Restrictions Precautions Precautions: Fall Precaution Comments: tremors/jerking movements, monitor BP Restrictions Weight Bearing Restrictions: No     Mobility  Bed Mobility Overal bed mobility: Needs Assistance             General bed mobility comments: pt OOB in recliner at start and end of session    Transfers Overall transfer level: Needs assistance Equipment used: Rolling walker (2 wheels) Transfers: Sit to/from Stand Sit to Stand: Min assist, Min guard           General transfer comment: minA initially with max cues for hand positioning with each rep. progressed to minG. completed x10 through session    Ambulation/Gait Ambulation/Gait assistance: Min assist, Mod assist Gait Distance (Feet): 12 Feet (+ 58ft) Assistive device: Rolling walker (2 wheels) Gait Pattern/deviations: Step-to pattern, Decreased stride length, Decreased dorsiflexion - right, Decreased dorsiflexion - left, Shuffle, Narrow base of support, Trunk flexed Gait velocity: decreased     General Gait Details: pt with very small movements and minimal step clearance. pt with single buckle of R knee after ~10 ft, able to recover with modA and RW. limited in progression by fatigue       Balance Overall balance assessment: Needs assistance Sitting-balance support: Feet supported, Bilateral upper extremity supported Sitting balance-Leahy Scale: Good Sitting balance - Comments: able to static sit unsupported in chair without UE support or assist.   Standing balance support: Bilateral upper extremity supported, During functional activity Standing balance-Leahy Scale: Poor Standing balance comment: BUE suppor ton RW and up to modA to steady  Cognition Arousal/Alertness:  Awake/alert Behavior During Therapy: Flat affect Overall Cognitive Status: Impaired/Different from baseline Area of Impairment: Following commands, Safety/judgement, Problem solving, Attention                   Current Attention Level: Focused   Following Commands: Follows one step commands inconsistently Safety/Judgement: Decreased awareness of deficits, Decreased awareness of safety Awareness: Intellectual Problem Solving: Slow processing, Requires verbal cues, Decreased initiation, Difficulty sequencing, Requires tactile cues General Comments: pt following all commands and able to answer appropriately when given increased time. Needs cues for safety and technique at times. pt reports she does not feel back to her normal self but is much better, states she knew what she wanted to say the whole time but was unable to express and was very scared by this.        Exercises Other Exercises Other Exercises: repeated sit-stand from recliner and then from [place] comments (skin integrity, edema, etc.): VSS on RA.      Pertinent Vitals/Pain Pain Assessment Pain Assessment: No/denies pain Faces Pain Scale: No hurt Pain Intervention(s): Monitored during session     PT Goals (current goals can now be found in the care plan section) Acute Rehab PT Goals Patient Stated Goal: to feel better, less tremors PT Goal Formulation: With patient Time For Goal Achievement: 09/11/21 Potential to Achieve Goals: Good Progress towards PT goals: Progressing toward goals    Frequency    Min 3X/week      PT Plan Current plan remains appropriate       AM-PAC PT "6 Clicks" Mobility   Outcome Measure  Help needed turning from your back to your side while in a flat bed without using bedrails?: A Little Help needed moving from lying on your back to sitting on the side of a flat bed without using bedrails?: A Little Help needed moving to and from a bed to a chair  (including a wheelchair)?: A Lot Help needed standing up from a chair using your arms (e.g., wheelchair or bedside chair)?: A Lot Help needed to walk in hospital room?: Total Help needed climbing 3-5 steps with a railing? : Total 6 Click Score: 12    End of Session Equipment Utilized During Treatment: Gait belt Activity Tolerance: Patient tolerated treatment well Patient left: in chair;with call bell/phone within reach Nurse Communication: Mobility status PT Visit Diagnosis: Unsteadiness on feet (R26.81);Difficulty in walking, not elsewhere classified (R26.2);Other abnormalities of gait and mobility (R26.89);Dizziness and giddiness (R42)     Time: 2992-4268 PT Time Calculation (min) (ACUTE ONLY): 29 min  Charges:  $Therapeutic Exercise: 23-37 mins                     West Carbo, PT, DPT   Acute Rehabilitation Department Pager #: [phone]   Sandra Cockayne 09/04/2021, 6:01 PM

## 2021-09-04 NOTE — Progress Notes (Signed)
[facility] NEPHROLOGY PROGRESS NOTE  Assessment/ Plan: Pt is a 81 y.o. 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   CCPD, coordinated through [facility] (Dr. Holley Raring) [phone]  57 kg EDW Last ESA was dosed at home per the HD unit. Mircera is ordered for 50 mcg on 08/10/21. May be able to give again here soon.  [phone] phone number for outpatient PD RN line  # PD associated peritonitis: Culture-negative peritonitis.  Gram stain with gram-positive cocci.  Reportedly the PD fluid was cloudy and the patient received antibiotics before the sample was collected.  Initially the antibiotics started on 2/4 vancomycin and cefepime which was later changed to Ancef because of concern of encephalopathy.  Seen by neurology and there is no improvement in mental status.  We will consult pharmacy to change Ancef to meropenem.  Discussed with the primary team and the patient's daughter. The plan is to complete total 2 weeks of antibiotics with vancomycin 1 g MWF and meropenem.  #ESRD on PD: Continue CCPD tonight, all 5 exchanges 2.5%.  Monitor lab.  #Acute metabolic encephalopathy: Concern for cephalosporin related neurotoxicity.  Seen by neurology.  We will DC Ancef and changed to meropenem as discussed above.  MRI of the brain with no acute finding.  # Anemia: On Mircera as outpatient and received Aranesp on 2/7.  We will continue weekly Aranesp.  Monitor hemoglobin.  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  Monitor lab.  # HTN/volume: BP variable.  Not on p.o. antihypertensives now.  Volume management by PD.  Subjective: The patient was seen and examined.  She was sleeping and was only able to open eyes with her name.  Her daughter at bedside.  Review of system is limited. Objective Vital signs in last 24 hours: Vitals:   09/03/21 1208 09/04/21 0039 09/04/21  0422 09/04/21 0729  BP: (!) 151/71 140/64 (!) 148/60 (!) 94/50  Pulse: 86 65 62 68  Resp: 18 16 14 17   Temp: 98.1 F (36.7 C) 97.6 F (36.4 C) 98.3 F (36.8 C)   TempSrc: Oral Oral Oral   SpO2: 96% 95% 97% 92%  Weight:   55 kg   Height:       Weight change: 0 kg  Intake/Output Summary (Last 24 hours) at 09/04/2021 0843 Last data filed at 09/04/2021 0400 Gross per 24 hour  Intake 275.19 ml  Output --  Net 275.19 ml       Labs: Basic Metabolic Panel: Recent Labs  Lab 09/02/21 0311 09/03/21 0346 09/04/21 0413  NA 138 138 140  K 3.7 3.4* 3.5  CL 98 97* 99  CO2 24 22 25   GLUCOSE 136* 138* 116*  BUN 62* 61* 60*  CREATININE 10.82* 11.10* 11.46*  CALCIUM 9.5 9.6 9.6  PHOS 6.3* 7.1* 6.6*   Liver Function Tests: Recent Labs  Lab 09/02/21 0311 09/03/21 0346 09/04/21 0413  ALBUMIN 2.2* 2.4* 2.4*   No results for input(s): LIPASE, AMYLASE in the last 168 hours. Recent Labs  Lab 08/31/21 1000  AMMONIA 32   CBC: Recent Labs  Lab 08/29/21 0353 08/30/21 0217 08/31/21 0108 09/01/21 0326 09/04/21 0413  WBC 8.7 6.9 8.6 9.6 10.0  HGB 7.3* 7.3* 8.5* 8.8* 8.9*  HCT 20.4* 21.3* 24.2* 25.0* 27.6*  MCV 101.5* 102.4* 103.4* 102.9* 109.1*  PLT 230 240 266 296 325   Cardiac Enzymes: No  results for input(s): CKTOTAL, CKMB, CKMBINDEX, TROPONINI in the last 168 hours. CBG: No results for input(s): GLUCAP in the last 168 hours.  Iron Studies: No results for input(s): IRON, TIBC, TRANSFERRIN, FERRITIN in the last 72 hours. Studies/Results: MR BRAIN WO CONTRAST  Result Date: 09/03/2021 CLINICAL DATA:  Neuro deficit with acute stroke suspected EXAM: MRI HEAD WITHOUT CONTRAST TECHNIQUE: Multiplanar, multiecho pulse sequences of the brain and surrounding structures were obtained without intravenous contrast. COMPARISON:  Head CT from 3 days ago FINDINGS: Brain: No acute infarction, hemorrhage, hydrocephalus, extra-axial collection or mass lesion. Mild for age chronic small  vessel ischemia in the hemispheric white matter. Age normal brain volume. Vascular: Preserved flow voids Skull and upper cervical spine: Unremarkable marrow signal. Sinuses/Orbits: Left sphenoid sinus opacification by inspissated material with sclerotic wall thickening, chronic. Bilateral cataract resection IMPRESSION: No acute finding, including infarct. Unremarkable appearance of the brain for age. Electronically Signed   By: Jorje Guild M.D.   On: 09/03/2021 11:09    Medications: Infusions:  sodium chloride Stopped (09/01/21 1220)    ceFAZolin (ANCEF) IV 1 g (09/03/21 2052)   dialysis solution 2.5% low-MG/low-CA     famotidine (PEPCID) IV 20 mg (09/03/21 1722)    Scheduled Medications:  (feeding supplement) PROSource Plus  30 mL Oral BID BM   feeding supplement  237 mL Oral BID BM   gentamicin cream  1 application Topical Daily   gentamicin cream  1 application Topical Daily   heparin  5,000 Units Subcutaneous Q8H   metoprolol tartrate  5 mg Intravenous Q8H   multivitamin  1 tablet Oral QHS   pantoprazole (PROTONIX) IV  40 mg Intravenous Q24H   sevelamer carbonate  2.4 g Oral TID WC    have reviewed scheduled and prn medications.  Physical Exam: General:NAD, sleeping and only able to open eyes with the name. Heart:RRR, s1s2 nl Lungs:clear b/l, no crackle Abdomen:soft, PD catheter in place Extremities:No edema Dialysis Access: PD catheter  Sharifa Bucholz Tanna Furry 09/04/2021,8:43 AM  LOS: 9 days

## 2021-09-04 NOTE — Progress Notes (Signed)
Pharmacy Antibiotic Note  Felicia Williams is a 81 y.o. female admitted on 08/26/2021 with bacteremial peritonitis.  Pharmacy has been consulted for vancomycin and meropenem dosing. Note patient is on daily PD. Today is day 10 abx.  Last dose vancomycin 2/8. Random vancomycin levels are supratherapeutic on 2/11 and 2/12, 33 mcg/ml and 32 mcg/ml respectively. Continues on daily PD. Cefepime was switched to cefazolin for encephalopathy, now consulted to switch to meropenem.  Plan: Redose vancomycin when level < 20 mcg/ml Vancomycin level 2/14 with am labs Begin meropenem 500 mg IV q24h Monitor cultures, clinical status, PD tolerance Narrow abx as able and to intraperitoneal at discharge  Planning 2 weeks total treatment    Height: 4\' 11"  (149.9 cm) Weight: 55 kg (121 lb 4.1 oz) IBW/kg (Calculated) : 43.2  Temp (24hrs), Avg:98 F (36.7 C), Min:97.6 F (36.4 C), Max:98.3 F (36.8 C)  Recent Labs  Lab 08/29/21 0353 08/30/21 0217 08/31/21 0108 09/01/21 0326 09/02/21 0311 09/03/21 0346 09/04/21 0413  WBC 8.7 6.9 8.6 9.6  --   --  10.0  CREATININE 9.51* 9.29* 9.72* 10.17* 10.82* 11.10* 11.46*  VANCORANDOM 20  --   --   --  33 32  --      Estimated Creatinine Clearance: 2.9 mL/min (A) (by C-G formula based on SCr of 11.46 mg/dL (H)).    Allergies  Allergen Reactions   Cefepime Other (See Comments)    Feb 2023 Encephalopathy with questionable seizures. Seems to be tolerating cefazolin    Sulfa Antibiotics Other (See Comments)    Shut pt's kidneys down    Antimicrobials: CTX 2/4 Vanc 2/4>>  Cefe 2/5 >>2/8 Cefazolin 2/9>>2/13 Meropenem 2/13 >>  Microbiology  2/4 BCx: neg 2/4 PD fluid: GPC on gram stain, neg final 2/5 MRSA neg    Thank you for involving pharmacy in this patient's care.  Renold Genta, PharmD, BCPS Clinical Pharmacist Clinical phone for 09/04/2021 until 3p is [phone] 09/04/2021 9:03 AM  **Pharmacist phone directory can be found on [email or web address] listed  under [place]**

## 2021-09-05 LAB — VANCOMYCIN, RANDOM: Vancomycin Rm: 26

## 2021-09-05 MED ORDER — VANCOMYCIN VARIABLE DOSE PER UNSTABLE RENAL FUNCTION (PHARMACIST DOSING): Status: DC

## 2021-09-05 MED ORDER — LOPERAMIDE HCL 2 MG PO CAPS
4.0000 mg | ORAL_CAPSULE | Freq: Once | ORAL | Status: AC
  Administered 2021-09-05: 4 mg via ORAL
  Filled 2021-09-05: qty 2

## 2021-09-05 MED ORDER — CHLORHEXIDINE GLUCONATE CLOTH 2 % EX PADS: 6.0000 | MEDICATED_PAD | Freq: Every day | CUTANEOUS | Status: DC

## 2021-09-05 MED ORDER — CHLORHEXIDINE GLUCONATE CLOTH 2 % EX PADS
6.0000 | MEDICATED_PAD | Freq: Every day | CUTANEOUS | Status: DC
  Administered 2021-09-06 – 2021-09-07 (×2): 6 via TOPICAL

## 2021-09-05 NOTE — Progress Notes (Signed)
PROGRESS NOTE   Felicia Williams  QZE:[id number]    DOB: 11/25/1940    DOA: 08/26/2021  PCP: [name], NP   I have briefly reviewed patients previous medical records in [facility].  Chief Complaint  Patient presents with   Abdominal Pain    Hospital Course:  Felicia Williams is an 81 y.o. female with a history of ESRD on PD, HTN, HLD, spinal stenosis, and GERD who presented to the ED at [facility] 2/4 with severe abdominal pain, nausea and dry heaving. She was found to have leukocytosis, lactic acid elevation. CT abd/pelvis revealed no acute intraabdominal findings. IV antibiotics were started for suspicion of bacterial peritonitis and the patient was admitted to [facility]. Peritoneal fluid appeared milky with neutrophil count 8,667 and GPC on gram stain.  Peritoneal fluid cultures remain negative to date.  Treating for PD associated peritonitis.  Acute metabolic encephalopathy, suspected due to cefepime and peritonitis, cefepime discontinued.  Nephrology on board.  AMS worse 2/9, CT head without acute abnormalities, neurology consulted, having myoclonic jerks, LTM EEG suggestive of toxic metabolic encephalopathy.  Mental status has significantly improved.  All cephalosporins discontinued due to some risk of neurotoxicity.  Meropenem initiated 2/13.  CIR have initiated insurance authorization for CIR admission.  Patient is medically optimized for DC to CIR as of 09/05/2021.   Assessment & Plan:  Principal Problem:   Peritonitis associated with peritoneal dialysis [facility]) Active Problems:   Acute metabolic encephalopathy   Hypokalemia   Macrocytic anemia   Essential hypertension, benign   End stage renal disease (HCC)   Gastroesophageal reflux disease   N&V (nausea and vomiting)   Sepsis (HCC)   Pruritus   Shortness of breath   Assessment and Plan: * Peritonitis associated with peritoneal dialysis ([place])- (present on admission) Sepsis due to peritonitis, POA. Peritoneal  dialysis-associated bacterial peritonitis.  Culture negative peritonitis but this may be related to getting antibiotics prior to culture. Blood culture (negative, final report) peritoneal fluid culture NGTD. Gram stain +GPC and milky peritoneal fluid.  - Continuing vancomycin.  Cefepime discontinued due to concern for AMS then switched over to cefazolin on 2/8.  However as recommended by neurology, all cephalosporins can carry some risk of neurotoxicity and hence recommended discontinuing cephalosporins and using alternate agent. Cefazolin switched to meropenem 2/13. Complete total 2 weeks of antibiotics including vancomycin and meropenem.  Also include in the duration the cefepime and cefazolin that she received.  Discussed with pharmacy.?  Antibiotics supposed to end 2/17 (pharmacy to confirm)  Acute metabolic encephalopathy Suspected multifactorial due to cefepime (primary etiology), sepsis from peritonitis, hospital delirium. Delirium precautions.  Minimize sedatives and opioids-all discontinued 2/9.  Cefepime followed by cefazolin discontinued. Had some myoclonic jerks early on during encephalopathy. CT head and MRI brain without acute abnormalities.  Ammonia level: 32. Per nephrology, unlikely from ESRD/uremia. Neurology input appreciated, LTM EEG indicative of severe toxic/metabolic encephalopathy as opposed to a seizure pattern. B12: 827, TSH 0.865 and RPR nonreactive. SLP consulted for dysphagia.  Currently on dysphagia 3 diet and thin liquids.  Dysphagia mostly due to AMS rather than mechanical etiology. Mental status has significantly improved and may be close to her baseline.  No further evaluation.  Hypokalemia Replaced.  Magnesium 2.4.  Macrocytic anemia- (present on admission) B12 normal, ferritin 1,857 and folate 45.6. Hemoglobin stable in the 8.5- 8.9 range for the last few days.  Essential hypertension, benign- (present on admission) Since tolerating p.o., switched back  antihypertensives to oral. Continue carvedilol. Controlled.  End stage renal disease ([place])- (present on admission) Nephrology continue to follow.  Gastroesophageal reflux disease- (present on admission) Continue PPI and H2 blockers, prior home meds.  Shortness of breath Negative acute CXR. Atelectasis likely contributing.  Resolved.  Pruritus- (present on admission) Moisturizing lotion ordered. TBili wnl, BUN not severely/acutely elevated.   Sepsis ([place])- (present on admission) Sepsis has resolved.  N&V (nausea and vomiting)- (present on admission) Resolved.   Body mass index is 23.56 kg/m.  Nutritional Status Nutrition Problem: Increased nutrient needs Etiology: acute illness Signs/Symptoms: estimated needs Interventions: MVI, Ensure Enlive (each supplement provides 350kcal and 20 grams of protein), Prostat, Liberalize Diet     DVT prophylaxis: heparin injection 5,000 Units Start: 08/27/21 0600     Code Status: Full Code:  Family Communication: None at bedside today. Disposition:  Status is: Inpatient Remains inpatient appropriate because: Awaiting insurance approval for CIR admission.  Medically optimized for DC.     Consultants:   Nephrology Neurology  Procedures:   Peritoneal dialysis LTM EEG  Antimicrobials:   As noted above   Subjective:  Patient denies complaints.  No pain, nausea or vomiting reported.  As per nursing, no acute issues noted.   Objective:   Vitals:   09/04/21 1730 09/04/21 2157 09/05/21 0342 09/05/21 1140  BP: (!) 139/58 (!) 135/54 (!) 119/58 114/60  Pulse:  81 78 90  Resp: 20 16 16  (!) 22  Temp: 97.9 F (36.6 C) 98.6 F (37 C) 98.2 F (36.8 C) 98 F (36.7 C)  TempSrc: Oral Oral Oral Oral  SpO2: 96% 98% 96% 96%  Weight: 52 kg  52.9 kg   Height:        General exam: Elderly female, moderately built and nourished sitting up comfortably in in reclining chair this morning. Respiratory system: Clear to auscultation.  No  increased work of breathing. Cardiovascular system: S1 and S2 heard, RRR.  No JVD, murmurs or pedal edema.  Telemetry personally reviewed: Sinus rhythm. Gastrointestinal system: Abdomen is nondistended, soft, nontender.  PD catheter site without acute findings.  Normal bowel sounds heard. Central nervous system: Alert and oriented x4.  No focal neurological deficits.  No myoclonic jerks noted. Extremities: Symmetric 5 x 5 power. Skin: No rashes, lesions or ulcers Psychiatry: Judgement and insight improved. Mood & affect pleasant and appropriate.    Data Reviewed:   I have personally reviewed following labs and imaging studies   CBC: Recent Labs  Lab 08/31/21 0108 09/01/21 0326 09/04/21 0413  WBC 8.6 9.6 10.0  HGB 8.5* 8.8* 8.9*  HCT 24.2* 25.0* 27.6*  MCV 103.4* 102.9* 109.1*  PLT 266 296 017    Basic Metabolic Panel: Recent Labs  Lab 08/31/21 0108 09/01/21 0326 09/02/21 0311 09/03/21 0346 09/04/21 0413  NA 136 137 138 138 140  K 3.2* 3.3* 3.7 3.4* 3.5  CL 97* 98 98 97* 99  CO2 24 24 24 22 25   GLUCOSE 136* 130* 136* 138* 116*  BUN 66* 60* 62* 61* 60*  CREATININE 9.72* 10.17* 10.82* 11.10* 11.46*  CALCIUM 8.9 9.3 9.5 9.6 9.6  MG  --   --   --  2.4  --   PHOS 5.4* 6.2* 6.3* 7.1* 6.6*    Liver Function Tests: Recent Labs  Lab 08/31/21 0108 09/01/21 0326 09/02/21 0311 09/03/21 0346 09/04/21 0413  ALBUMIN 2.0* 2.1* 2.2* 2.4* 2.4*    CBG: No results for input(s): GLUCAP in the last 168 hours.   Microbiology Studies:   Recent Results (from the  past 240 hour(s))  Blood culture (routine x 2)     Status: None   Collection Time: 08/26/21  8:39 PM   Specimen: Left Antecubital; Blood  Result Value Ref Range Status   Specimen Description LEFT ANTECUBITAL  Final   Special Requests   Final    BOTTLES DRAWN AEROBIC AND ANAEROBIC Blood Culture results may not be optimal due to an excessive volume of blood received in culture bottles   Culture   Final    NO  GROWTH 5 DAYS Performed at [facility], [address], [place], [place] [address]    Report Status 08/31/2021 FINAL  Final  Blood culture (routine x 2)     Status: None   Collection Time: 08/26/21  8:46 PM   Specimen: BLOOD LEFT HAND  Result Value Ref Range Status   Specimen Description BLOOD LEFT HAND  Final   Special Requests   Final    BOTTLES DRAWN AEROBIC AND ANAEROBIC Blood Culture results may not be optimal due to an excessive volume of blood received in culture bottles   Culture   Final    NO GROWTH 5 DAYS Performed at [facility], [address], [place], [place] [address]    Report Status 08/31/2021 FINAL  Final  Gram stain     Status: None   Collection Time: 08/26/21  9:12 PM   Specimen: Peritoneal Washings  Result Value Ref Range Status   Specimen Description PERITONEAL FLUID  Final   Special Requests NONE  Final   Gram Stain   Final    WBC PRESENT, PREDOMINANTLY PMN GRAM POSITIVE COCCI CYTOSPIN SMEAR Performed at [facility], [place] [address] [place], [place] [address]    Report Status 08/27/2021 FINAL  Final  Culture, body fluid w Gram Stain-bottle     Status: None   Collection Time: 08/26/21  9:12 PM   Specimen: Peritoneal Washings  Result Value Ref Range Status   Specimen Description PERITONEAL FLUID  Final   Special Requests NONE  Final   Culture   Final    NO GROWTH 5 DAYS Performed at [facility] [address] [place], [place] [address]    Report Status 09/01/2021 FINAL  Final  Resp Panel by RT-PCR (Flu A&B, Covid) Nasopharyngeal Swab     Status: None   Collection Time: 08/26/21  9:14 PM   Specimen: Nasopharyngeal Swab; Nasopharyngeal(NP) swabs in vial transport medium  Result Value Ref Range Status   SARS Coronavirus 2 by RT PCR NEGATIVE NEGATIVE Final    Comment: (NOTE) SARS-CoV-2 target nucleic acids are NOT DETECTED.  The SARS-CoV-2 RNA is generally detectable in upper respiratory specimens during the acute phase of infection.  The lowest concentration of SARS-CoV-2 viral copies this assay can detect is 138 copies/mL. A negative result does not preclude SARS-Cov-2 infection and should not be used as the sole basis for treatment or other patient management decisions. A negative result may occur with  improper specimen collection/handling, submission of specimen other than nasopharyngeal swab, presence of viral mutation(s) within the areas targeted by this assay, and inadequate number of viral copies(<138 copies/mL). A negative result must be combined with clinical observations, patient history, and epidemiological information. The expected result is Negative.  Fact Sheet for Patients:  EntrepreneurPulse.com.au  Fact Sheet for Healthcare Providers:  IncredibleEmployment.be  This test is no t yet approved or cleared by the [place] FDA and  has been authorized for detection and/or diagnosis of SARS-CoV-2 by FDA  under an Emergency Use Authorization (EUA). This EUA will remain  in effect (meaning this test can be used) for the duration of the COVID-19 declaration under Section 564(b)(1) of the Act, 21 U.S.C.section 360bbb-3(b)(1), unless the authorization is terminated  or revoked sooner.       Influenza A by PCR NEGATIVE NEGATIVE Final   Influenza B by PCR NEGATIVE NEGATIVE Final    Comment: (NOTE) The Xpert Xpress SARS-CoV-2/FLU/RSV plus assay is intended as an aid in the diagnosis of influenza from Nasopharyngeal swab specimens and should not be used as a sole basis for treatment. Nasal washings and aspirates are unacceptable for Xpert Xpress SARS-CoV-2/FLU/RSV testing.  Fact Sheet for Patients: EntrepreneurPulse.com.au  Fact Sheet for Healthcare Providers: IncredibleEmployment.be  This test is not yet approved or cleared by the [place] FDA and has been authorized for detection and/or diagnosis of SARS-CoV-2 by FDA  under an Emergency Use Authorization (EUA). This EUA will remain in effect (meaning this test can be used) for the duration of the COVID-19 declaration under Section 564(b)(1) of the Act, 21 U.S.C. section 360bbb-3(b)(1), unless the authorization is terminated or revoked.  Performed at [facility], [address] [place], [place] [address]   MRSA Next Gen by PCR, Nasal     Status: None   Collection Time: 08/27/21 10:37 AM   Specimen: Nasal Mucosa; Nasal Swab  Result Value Ref Range Status   MRSA by PCR Next Gen NOT DETECTED NOT DETECTED Final    Comment: (NOTE) The GeneXpert MRSA Assay (FDA approved for NASAL specimens only), is one component of a comprehensive MRSA colonization surveillance program. It is not intended to diagnose MRSA infection nor to guide or monitor treatment for MRSA infections. Test performance is not FDA approved in patients less than 74 years old. Performed at [facility], [place] [address] [place], [place] [address]     Radiology Studies:  No results found.  Scheduled Meds:    (feeding supplement) PROSource Plus  30 mL Oral BID BM   carvedilol  12.5 mg Oral BID WC   darbepoetin (ARANESP) injection - NON-DIALYSIS  100 mcg Subcutaneous Q Tue-1800   famotidine  20 mg Oral Daily   feeding supplement  237 mL Oral BID BM   gentamicin cream  1 application Topical Daily   heparin  5,000 Units Subcutaneous Q8H   multivitamin  1 tablet Oral QHS   pantoprazole  40 mg Oral Daily   sevelamer carbonate  2.4 g Oral TID WC   vancomycin variable dose per unstable renal function (pharmacist dosing)   Does not apply See admin instructions    Continuous Infusions:    sodium chloride Stopped (09/01/21 1220)   dialysis solution 2.5% low-MG/low-CA     meropenem (MERREM) IV 500 mg (09/05/21 1034)     LOS: 10 days     [place] Leep, MD,  FACP, [facility], [facility], [facility] (Care Management Physician Certified) [place]  To  contact the attending provider between 7A-7P or the covering provider during after hours 7P-7A, please log into the web site [email or web address] and access using universal [place] password for that web site. If you do not have the password, please call the hospital operator.  09/05/2021, 3:12 PM

## 2021-09-05 NOTE — Plan of Care (Signed)
  Problem: Clinical Measurements: Goal: Respiratory complications will improve Outcome: Progressing   Problem: Activity: Goal: Risk for activity intolerance will decrease Outcome: Progressing   Problem: Safety: Goal: Ability to remain free from injury will improve Outcome: Progressing   

## 2021-09-05 NOTE — Progress Notes (Signed)
Occupational Therapy Treatment Patient Details Name: Felicia Williams MRN: [id number] DOB: [birth year] Today's Date: 09/05/2021   History of present illness Pt is a 81 y.o. F who presents 08/26/2021 with severe abdominal pain, nausea, dry heaving. Found to have leukocytosis and lactic acid elevation. CT abd/pelvis revealed no acute intraabdominal findings. IV antibiotics started for suspicion of bacterial peritonitis. Significant PMH: ESRD on PD, HTN, HLD, spinal stenosis.   OT comments  Pt presented in room eating breakfast with noted increase in ability to control utensils and cup with no spillage. Pt was able to report name, date and location with no cues. Pt has made good progress as able to complete peri care in standing post set up with min guard to min assist for quality of hygiene. Pt was able to complete sit to stand with min guard multiple times but noted more then about 2-3 mins of standing reported they feel weak and requiring a seated rest. Pt was able to complete several steps to chair with min guard to supervision and FW. Pt currently with functional limitations due to the deficits listed below (see OT Problem List).  Pt will benefit from skilled OT to increase their safety and independence with ADL and functional mobility for ADL to facilitate discharge to venue listed below.     Recommendations for follow up therapy are one component of a multi-disciplinary discharge planning process, led by the attending physician.  Recommendations may be updated based on patient status, additional functional criteria and insurance authorization.    Follow Up Recommendations  Acute inpatient rehab (3hours/day)    Assistance Recommended at Discharge Frequent or constant Supervision/Assistance  Patient can return home with the following  A little help with walking and/or transfers;A lot of help with bathing/dressing/bathroom;Assistance with cooking/housework;Two people to help with  bathing/dressing/bathroom;Direct supervision/assist for medications management;Direct supervision/assist for financial management;Assist for transportation   Equipment Recommendations  BSC/3in1    Recommendations for Other Services      Precautions / Restrictions Precautions Precautions: Fall Precaution Comments: tremors/jerky movements ( increasing in use of functional tasks) Restrictions Weight Bearing Restrictions: No       Mobility Bed Mobility Overal bed mobility: Needs Assistance Bed Mobility: Supine to Sit     Supine to sit: Supervision, HOB elevated (head at 90 degrees)          Transfers Overall transfer level: Needs assistance Equipment used: Rolling walker (2 wheels) Transfers: Sit to/from Stand Sit to Stand: Min guard           General transfer comment: increase time for hand placement     Balance Overall balance assessment: Needs assistance Sitting-balance support: Feet supported Sitting balance-Leahy Scale: Good     Standing balance support: Bilateral upper extremity supported, During functional activity Standing balance-Leahy Scale: Fair Standing balance comment: as fatigued went onto forarms                           ADL either performed or assessed with clinical judgement   ADL Overall ADL's : Needs assistance/impaired Eating/Feeding: Set up;Sitting   Grooming: Wash/dry hands;Wash/dry face;Set up;Sitting   Upper Body Bathing: Minimal assistance;Cueing for safety;Cueing for sequencing;Sitting   Lower Body Bathing: Moderate assistance;Sit to/from stand   Upper Body Dressing : Minimal assistance;Sitting   Lower Body Dressing: Moderate assistance;Sit to/from stand   Toilet Transfer: Min guard;Cueing for safety;Cueing for sequencing   Toileting- Water quality scientist and Hygiene: Cueing for safety;Cueing for sequencing;Minimal assistance Toileting Runner, broadcasting/film/video  Details (indicate cue type and reason): Pt was able to  start post set up but fatigued in standing and requires assist to have good quality of hygiene     Functional mobility during ADLs: Min guard;Rolling walker (2 wheels) General ADL Comments: Pt reporting they feel like they have sea legs with prolonged standing    Extremity/Trunk Assessment Upper Extremity Assessment Upper Extremity Assessment: RUE deficits/detail;LUE deficits/detail RUE Deficits / Details: Pt noted to still have difficulties with tremors LUE Deficits / Details: Pt noted to still have difficulties with tremors   Lower Extremity Assessment Lower Extremity Assessment: Defer to PT evaluation        Vision   Additional Comments: Pt has reading glasses   Perception     Praxis      Cognition Arousal/Alertness: Awake/alert Behavior During Therapy: Flat affect Overall Cognitive Status: Impaired/Different from baseline Area of Impairment: Memory, Safety/judgement, Problem solving                   Current Attention Level: Focused   Following Commands: Follows multi-step commands inconsistently Safety/Judgement: Decreased awareness of safety   Problem Solving: Slow processing, Requires verbal cues, Requires tactile cues, Difficulty sequencing General Comments: Pt has increase in ability to complete tasks but making significant improvements        Exercises      Shoulder Instructions       General Comments      Pertinent Vitals/ Pain       Pain Assessment Pain Assessment: No/denies pain  Home Living                                          Prior Functioning/Environment              Frequency  Min 2X/week        Progress Toward Goals  OT Goals(current goals can now be found in the care plan section)  Progress towards OT goals: Progressing toward goals  Acute Rehab OT Goals Patient Stated Goal: to go to rehab OT Goal Formulation: With patient Time For Goal Achievement: 09/19/21 Potential to Achieve Goals:  Good ADL Goals Pt Will Perform Grooming: with modified independence;sitting Pt Will Perform Upper Body Bathing: with set-up;sitting Pt Will Perform Lower Body Bathing: with supervision;sit to/from stand Additional ADL Goal #1: Pt will be able to stand for 10 mins for ADLS  Plan Discharge plan remains appropriate    Co-evaluation                 AM-PAC OT "6 Clicks" Daily Activity     Outcome Measure   Help from another person eating meals?: A Little Help from another person taking care of personal grooming?: A Little Help from another person toileting, which includes using toliet, bedpan, or urinal?: A Lot Help from another person bathing (including washing, rinsing, drying)?: A Lot Help from another person to put on and taking off regular upper body clothing?: A Little Help from another person to put on and taking off regular lower body clothing?: A Lot 6 Click Score: 15    End of Session Equipment Utilized During Treatment: Gait belt;Rolling walker (2 wheels)  OT Visit Diagnosis: Unsteadiness on feet (R26.81);Other abnormalities of gait and mobility (R26.89);Muscle weakness (generalized) (M62.81)   Activity Tolerance Patient tolerated treatment well   Patient Left in chair;with call bell/phone within reach;with chair alarm set;with family/visitor  present   Nurse Communication Mobility status        Time: 0732-0823 OT Time Calculation (min): 51 min  Charges: OT General Charges $OT Visit: 1 Visit OT Treatments $Self Care/Home Management : 38-52 mins  Joeseph Amor OTR/L  Acute Rehab Services  [phone] office number [phone] pager number   Joeseph Amor 09/05/2021, 8:35 AM

## 2021-09-05 NOTE — Progress Notes (Signed)
[facility] NEPHROLOGY PROGRESS NOTE  Assessment/ Plan: Pt is a 81 y.o. yo female  with ESRD on CCPD x 4 years, HL, HTN, and GERD who was admitted as transfer from [facility] with peritonitis.   CCPD, coordinated through [facility] (Dr. Holley Raring) [phone]  57 kg EDW Last ESA was dosed at home per the HD unit. Mircera is ordered for 50 mcg on 08/10/21. May be able to give again here soon.  [phone] phone number for outpatient PD RN line  # PD associated peritonitis: Culture-negative peritonitis.  Gram stain with gram-positive cocci.  Reportedly the PD fluid was cloudy and the patient received antibiotics before the sample was collected.  Initially the antibiotics started on 2/4 vancomycin and cefepime which was later changed to Ancef because of concern of encephalopathy.  Seen by neurology and there is no improvement in mental status.   The antibiotics changed to meropenem on 2/13 in order to avoid cephalosporins.  Her mental status seems to be much better today. The plan is to complete total 2 weeks of antibiotics with vancomycin 1 g MWF and meropenem.  #ESRD on PD: Continue CCPD tonight, all 5 exchanges 2.5%.  Monitor lab.  Discussed with the dialysis nurse.  #Acute metabolic encephalopathy: Concern for cephalosporin related neurotoxicity.  Seen by neurology.   MRI of the brain with no acute finding.  Avoiding cephalosporins and now changed to meropenem.  Mental status is much better today.  She is working with physical therapist.  # Anemia: On Mircera as outpatient .  We will continue weekly Aranesp.  Monitor hemoglobin.  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  Monitor lab.  # HTN/volume: BP variable.  Not on p.o. antihypertensives now.  Volume management by PD.  Subjective: The patient was seen and examined.  She looks more alert and awake today.  Working with physical therapy.  She denies nausea, vomiting, chest pain or  shortness of breath.  Her daughter is present at bedside. Objective Vital signs in last 24 hours: Vitals:   09/04/21 1142 09/04/21 1730 09/04/21 2157 09/05/21 0342  BP: (!) 133/52 (!) 139/58 (!) 135/54 (!) 119/58  Pulse: 75  81 78  Resp: 13 20 16 16   Temp: (!) 97.3 F (36.3 C) 97.9 F (36.6 C) 98.6 F (37 C) 98.2 F (36.8 C)  TempSrc: Oral Oral Oral Oral  SpO2: 97% 96% 98% 96%  Weight:  52 kg  52.9 kg  Height:       Weight change: -3 kg  Intake/Output Summary (Last 24 hours) at 09/05/2021 0839 Last data filed at 09/05/2021 0757 Gross per 24 hour  Intake 9654 ml  Output 9783 ml  Net -129 ml        Labs: Basic Metabolic Panel: Recent Labs  Lab 09/02/21 0311 09/03/21 0346 09/04/21 0413  NA 138 138 140  K 3.7 3.4* 3.5  CL 98 97* 99  CO2 24 22 25   GLUCOSE 136* 138* 116*  BUN 62* 61* 60*  CREATININE 10.82* 11.10* 11.46*  CALCIUM 9.5 9.6 9.6  PHOS 6.3* 7.1* 6.6*    Liver Function Tests: Recent Labs  Lab 09/02/21 0311 09/03/21 0346 09/04/21 0413  ALBUMIN 2.2* 2.4* 2.4*    No results for input(s): LIPASE, AMYLASE in the last 168 hours. Recent Labs  Lab 08/31/21 1000  AMMONIA 32    CBC: Recent Labs  Lab 08/30/21 0217 08/31/21 0108 09/01/21 0326 09/04/21 0413  WBC 6.9 8.6 9.6 10.0  HGB  7.3* 8.5* 8.8* 8.9*  HCT 21.3* 24.2* 25.0* 27.6*  MCV 102.4* 103.4* 102.9* 109.1*  PLT 240 266 296 325    Cardiac Enzymes: No results for input(s): CKTOTAL, CKMB, CKMBINDEX, TROPONINI in the last 168 hours. CBG: No results for input(s): GLUCAP in the last 168 hours.  Iron Studies: No results for input(s): IRON, TIBC, TRANSFERRIN, FERRITIN in the last 72 hours. Studies/Results: MR BRAIN WO CONTRAST  Result Date: 09/03/2021 CLINICAL DATA:  Neuro deficit with acute stroke suspected EXAM: MRI HEAD WITHOUT CONTRAST TECHNIQUE: Multiplanar, multiecho pulse sequences of the brain and surrounding structures were obtained without intravenous contrast. COMPARISON:  Head  CT from 3 days ago FINDINGS: Brain: No acute infarction, hemorrhage, hydrocephalus, extra-axial collection or mass lesion. Mild for age chronic small vessel ischemia in the hemispheric white matter. Age normal brain volume. Vascular: Preserved flow voids Skull and upper cervical spine: Unremarkable marrow signal. Sinuses/Orbits: Left sphenoid sinus opacification by inspissated material with sclerotic wall thickening, chronic. Bilateral cataract resection IMPRESSION: No acute finding, including infarct. Unremarkable appearance of the brain for age. Electronically Signed   By: Jorje Guild M.D.   On: 09/03/2021 11:09    Medications: Infusions:  sodium chloride Stopped (09/01/21 1220)   dialysis solution 2.5% low-MG/low-CA     meropenem (MERREM) IV 500 mg (09/04/21 1208)    Scheduled Medications:  (feeding supplement) PROSource Plus  30 mL Oral BID BM   carvedilol  12.5 mg Oral BID WC   darbepoetin (ARANESP) injection - NON-DIALYSIS  100 mcg Subcutaneous Q Tue-1800   famotidine  20 mg Oral Daily   feeding supplement  237 mL Oral BID BM   gentamicin cream  1 application Topical Daily   heparin  5,000 Units Subcutaneous Q8H   multivitamin  1 tablet Oral QHS   pantoprazole  40 mg Oral Daily   sevelamer carbonate  2.4 g Oral TID WC    have reviewed scheduled and prn medications.  Physical Exam: General:NAD, more alert awake and following commands. Heart:RRR, s1s2 nl Lungs:clear b/l, no crackle Abdomen:soft, PD catheter in place Extremities:No edema Dialysis Access: PD catheter  Cornie Herrington Tanna Furry 09/05/2021,8:39 AM  LOS: 10 days

## 2021-09-05 NOTE — Plan of Care (Signed)

## 2021-09-05 NOTE — Progress Notes (Signed)
Pharmacy Antibiotic Note  Kiona Blume is a 81 y.o. female admitted on 08/26/2021 with bacteremial peritonitis. Pt is ESRD on CCPD. Pharmacy consulted for vancomycin and meropenem dosing. Pt initially on cefepime which was transitioned to cefazolin 2/2 concern for neurotoxicity, later transitioned to meropenem without improvement in AMS.  Vancomycin level this am is 26 mcg/ml - will repeat tomorrow and likely redose.  Plan: Hold vancomycin - redose once VR < 20 mcg/ml Continue meropenem 500mg  IV q24h   Height: 4\' 11"  (149.9 cm) Weight: 52.9 kg (116 lb 10 oz) IBW/kg (Calculated) : 43.2  Temp (24hrs), Avg:98 F (36.7 C), Min:97.3 F (36.3 C), Max:98.6 F (37 C)  Recent Labs  Lab 08/30/21 0217 08/31/21 0108 09/01/21 0326 09/02/21 0311 09/02/21 0311 09/03/21 0346 09/04/21 0413 09/05/21 0454  WBC 6.9 8.6 9.6  --   --   --  10.0  --   CREATININE 9.29* 9.72* 10.17* 10.82*  --  11.10* 11.46*  --   VANCORANDOM  --   --   --  33   < > 32  --  26   < > = values in this interval not displayed.     Estimated Creatinine Clearance: 2.9 mL/min (A) (by C-G formula based on SCr of 11.46 mg/dL (H)).    Allergies  Allergen Reactions   Cefepime Other (See Comments)    Feb 2023 Encephalopathy with questionable seizures. Seems to be tolerating cefazolin    Sulfa Antibiotics Other (See Comments)    Shut pt's kidneys down    Antimicrobials: CTX 2/4 Vanc 2/4>>  Cefe 2/5 >>2/8 Cefazolin 2/9>>2/13 Meropenem 2/13 >>  Microbiology  2/4 BCx: neg 2/4 PD fluid: GPC on gram stain, neg final 2/5 MRSA neg    Arrie Senate, PharmD, BCPS, [facility] Clinical Pharmacist [phone] Please check AMION for all [facility] Pharmacy numbers 09/05/2021

## 2021-09-05 NOTE — Progress Notes (Signed)
Inpatient Rehabilitation Admissions Coordinator   I met with patient at bedside and spoke with her daughter, [name] , by phone. I will begin insurance Auth with [facility] for a possible CIR admit. They are in agreement.  Danne Baxter, RN, MSN Rehab Admissions Coordinator [phone] 09/05/2021 12:46 PM

## 2021-09-06 DIAGNOSIS — R198 Other specified symptoms and signs involving the digestive system and abdomen: Secondary | ICD-10-CM

## 2021-09-06 DIAGNOSIS — R197 Diarrhea, unspecified: Secondary | ICD-10-CM

## 2021-09-06 DIAGNOSIS — R0602 Shortness of breath: Secondary | ICD-10-CM

## 2021-09-06 DIAGNOSIS — L299 Pruritus, unspecified: Secondary | ICD-10-CM

## 2021-09-06 DIAGNOSIS — D539 Nutritional anemia, unspecified: Secondary | ICD-10-CM

## 2021-09-06 DIAGNOSIS — R112 Nausea with vomiting, unspecified: Secondary | ICD-10-CM

## 2021-09-06 LAB — VANCOMYCIN, RANDOM: Vancomycin Rm: 26

## 2021-09-06 MED ORDER — DARBEPOETIN ALFA 100 MCG/0.5ML IJ SOSY
100.0000 ug | PREFILLED_SYRINGE | INTRAMUSCULAR | Status: DC
  Filled 2021-09-06: qty 0.5

## 2021-09-06 MED ORDER — LOPERAMIDE HCL 2 MG PO CAPS
2.0000 mg | ORAL_CAPSULE | ORAL | Status: DC | PRN
  Administered 2021-09-06 – 2021-09-07 (×3): 2 mg via ORAL
  Filled 2021-09-06 (×3): qty 1

## 2021-09-06 NOTE — Progress Notes (Signed)
Inpatient Rehabilitation Admissions Coordinator   I await insurance determination for possible CIR admit.  Danne Baxter, RN, MSN Rehab Admissions Coordinator [phone] 09/06/2021 10:22 AM

## 2021-09-06 NOTE — Progress Notes (Signed)
Pharmacy Antibiotic Note  Felicia Williams is a 81 y.o. female admitted on 08/26/2021 with bacteremial peritonitis. Pt is ESRD on CCPD. Pharmacy consulted for vancomycin and meropenem dosing. Pt initially on cefepime which was transitioned to cefazolin 2/2 concern for neurotoxicity, later transitioned to meropenem without improvement in AMS.  Vancomycin level this am is 26 mcg/ml.  Plan: Hold vancomycin and recheck random level with AM labs on 09/08/21 - redose once VR < 20 mcg/ml. Continue meropenem 500mg  IV q24h   Height: 4\' 11"  (149.9 cm) Weight: 52.5 kg (115 lb 11.9 oz) IBW/kg (Calculated) : 43.2  Temp (24hrs), Avg:98.1 F (36.7 C), Min:97.6 F (36.4 C), Max:98.5 F (36.9 C)  Recent Labs  Lab 08/31/21 0108 09/01/21 0326 09/02/21 0311 09/02/21 0311 09/03/21 0346 09/04/21 0413 09/05/21 0454 09/06/21 0352  WBC 8.6 9.6  --   --   --  10.0  --   --   CREATININE 9.72* 10.17* 10.82*  --  11.10* 11.46*  --   --   VANCORANDOM  --   --  33   < > 32  --  26 26   < > = values in this interval not displayed.     Estimated Creatinine Clearance: 2.9 mL/min (A) (by C-G formula based on SCr of 11.46 mg/dL (H)).    Allergies  Allergen Reactions   Cefepime Other (See Comments)    Feb 2023 Encephalopathy with questionable seizures. Seems to be tolerating cefazolin    Sulfa Antibiotics Other (See Comments)    Shut pt's kidneys down    Antimicrobials: CTX 2/4 Vanc 2/4 - (2/17) Cefe 2/5 >>2/8 Cefazolin 2/9>>2/13 Meropenem 2/13 - (2/17)  Microbiology  2/4 BCx: neg 2/4 PD fluid: GPC on gram stain, neg final 2/5 MRSA neg    Luisa Hart, PharmD, BCPS Clinical Pharmacist 09/06/2021 8:20 AM   Please refer to AMION for pharmacy phone number

## 2021-09-06 NOTE — Assessment & Plan Note (Addendum)
Abdominal exam benign.  Low suspicion for C. difficile.  No fever or leukocytosis.  She is on feeding supplement which might contribute. -Encouraged to use Imodium. -May consider decreasing feeding supplements if she continues to have diarrhea.

## 2021-09-06 NOTE — Progress Notes (Addendum)
PROGRESS NOTE  Felicia Williams PZW:[id number] DOB: Jan 14, 1941   PCP: [name], NP  Patient is from: Home  DOA: 08/26/2021 LOS: 40  Chief complaints:  Chief Complaint  Patient presents with   Abdominal Pain     Brief Narrative / Interim history: Felicia Williams is an 81 y.o. female with a history of ESRD on PD, HTN, HLD, spinal stenosis, and GERD who presented to the ED at [facility] 2/4 with severe abdominal pain, nausea and dry heaving. She was found to have leukocytosis, lactic acid elevation. CT abd/pelvis revealed no acute intraabdominal findings. IV antibiotics were started for suspicion of bacterial peritonitis and the patient was admitted to [facility]. Peritoneal fluid appeared milky with neutrophil count 8,667 and GPC on gram stain.  Peritoneal fluid cultures remain negative to date.  Treating for PD associated peritonitis.  Acute metabolic encephalopathy, suspected due to cefepime and peritonitis, cefepime discontinued.  Nephrology on board.  AMS worse 2/9, CT head without acute abnormalities, neurology consulted, having myoclonic jerks, LTM EEG suggestive of toxic metabolic encephalopathy.  Mental status has significantly improved.  All cephalosporins discontinued due to some risk of neurotoxicity.  Meropenem initiated 2/13.  CIR have initiated insurance authorization for CIR admission.  Patient is medically optimized for DC to CIR as of 09/05/2021.   Subjective: Seen and examined earlier this morning.  Sitting on bedside chair.  No major events overnight of this morning.  No complaints either.  She denies chest pain, dyspnea or GI symptoms.  Objective: Vitals:   09/05/21 1936 09/06/21 0256 09/06/21 0718 09/06/21 1135  BP: (!) 143/60 (!) 127/51 (!) 120/52 118/67  Pulse: 94 80 80 78  Resp: 20 20 18 18   Temp: 98.3 F (36.8 C) 98.5 F (36.9 C) 97.6 F (36.4 C) 97.7 F (36.5 C)  TempSrc: Oral Oral Oral Oral  SpO2: 96% 95% 94% 100%  Weight:  52.5 kg    Height:         Examination:  GENERAL: No apparent distress.  Nontoxic. HEENT: MMM.  Vision and hearing grossly intact.  NECK: Supple.  No apparent JVD.  RESP: 100% on RA.  No IWOB.  Fair aeration bilaterally. CVS:  RRR. Heart sounds normal.  ABD/GI/GU: BS+. Abd soft, NTND.  PD cath in place. MSK/EXT:  Moves extremities. No apparent deformity. No edema.  SKIN: no apparent skin lesion or wound NEURO: Awake, alert and oriented appropriately.  No apparent focal neuro deficit. PSYCH: Calm. Normal affect.   Procedures:  None  Microbiology summarized: [id number] and influenza PCR nonreactive. Blood cultures NGTD. Peritoneal fluid culture NGTD.  Assessment and Plan: * Sepsis due to peritonitis associated with peritoneal dialysis [facility])- (present on admission) She had abdominal pain with milky peritoneal fluid and GPC on Gram stain but fluid and blood culture negative. -Vanco and cefepime>> vanco and meropenem through 2/17 to complete 2 weeks course per ID.  Acute metabolic encephalopathy Felt to be neurotoxicity due to cephalosporin.  Other encephalopathy work-up including CT head, MRI brain, LTM EEG, ammonia, TSH, RPR and B12 unrevealing.  Encephalopathy resolved after stopping cephalosporin. -Reorientation and delirium precautions  End stage renal disease ([place])- (present on admission) Peritoneal dialysis per nephrology.  Diarrhea Abdominal exam benign.  Low suspicion for C. difficile.  No fever or leukocytosis.  She is on feeding supplement which might contribute. -Imodium as needed  Hypokalemia Resolved.  Shortness of breath Resolved.  Pruritus- (present on admission) -Moisturizing lotion ordered. TBili wnl, BUN not severely/acutely elevated.   Sepsis ([place])- (present on  admission) Sepsis has resolved.  Gastroesophageal reflux disease- (present on admission) -Continue PPI and H2 blockers, prior home meds.  N&V (nausea and vomiting)- (present on admission) Resolved.  Macrocytic  anemia- (present on admission) Recent Labs    11/16/20 1427 01/16/21 1408 08/26/21 1814 08/27/21 0120 08/28/21 0804 08/29/21 0353 08/30/21 0217 08/31/21 0108 09/01/21 0326 09/04/21 0413  HGB 11.6* 12.6 9.7* 7.7* 7.8* 7.3* 7.3* 8.5* 8.8* 8.9*  H&H stable. Anemia panel consistent with anemia of chronic disease.  Essential hypertension, benign- (present on admission) Normotensive. -Continue Coreg.    Increased nutrient needs Body mass index is 23.38 kg/m. Nutrition Problem: Increased nutrient needs Etiology: acute illness Signs/Symptoms: estimated needs Interventions: MVI, Ensure Enlive (each supplement provides 350kcal and 20 grams of protein), Prostat, Liberalize Diet   DVT prophylaxis:  heparin injection 5,000 Units Start: 08/27/21 0600  Code Status: Full code Family Communication: Patient and/or RN. Available if any question.  Level of care: Med-Surg Status is: Inpatient Remains inpatient appropriate because: Lack of safe disposition/CIR bed           Consultants:  Nephrology Neurology Infectious disease   Sch Meds:  Scheduled Meds:  (feeding supplement) PROSource Plus  30 mL Oral BID BM   carvedilol  12.5 mg Oral BID WC   Chlorhexidine Gluconate Cloth  6 each Topical Daily   darbepoetin (ARANESP) injection - NON-DIALYSIS  100 mcg Subcutaneous Q Wed-1800   famotidine  20 mg Oral Daily   feeding supplement  237 mL Oral BID BM   gentamicin cream  1 application Topical Daily   heparin  5,000 Units Subcutaneous Q8H   multivitamin  1 tablet Oral QHS   pantoprazole  40 mg Oral Daily   sevelamer carbonate  2.4 g Oral TID WC   vancomycin variable dose per unstable renal function (pharmacist dosing)   Does not apply See admin instructions   Continuous Infusions:  sodium chloride Stopped (09/01/21 1220)   dialysis solution 2.5% low-MG/low-CA     meropenem (MERREM) IV 500 mg (09/06/21 0845)   PRN Meds:.sodium chloride, acetaminophen **OR**  acetaminophen, camphor-menthol, labetalol, loperamide, ondansetron **OR** ondansetron (ZOFRAN) IV, polyethylene glycol  Antimicrobials: Anti-infectives (From admission, onward)    Start     Dose/Rate Route Frequency Ordered Stop   09/05/21 0931  vancomycin variable dose per unstable renal function (pharmacist dosing)         Does not apply See admin instructions 09/05/21 0931     09/04/21 1000  meropenem (MERREM) 500 mg in sodium chloride 0.9 % 100 mL IVPB        500 mg 200 mL/hr over 30 Minutes Intravenous Every 24 hours 09/04/21 0902 09/08/21 2359   08/31/21 2100  ceFAZolin (ANCEF) IVPB 1 g/50 mL premix  Status:  Discontinued        1 g 100 mL/hr over 30 Minutes Intravenous Every 24 hours 08/31/21 0846 09/04/21 0853   08/30/21 2000  ceFAZolin (ANCEF) IVPB 2g/100 mL premix        2 g 200 mL/hr over 30 Minutes Intravenous  Once 08/30/21 0731 08/30/21 2214   08/30/21 1000  vancomycin (VANCOCIN) IVPB 1000 mg/200 mL premix        1,000 mg 200 mL/hr over 60 Minutes Intravenous  Once 08/29/21 0833 08/30/21 1731   08/26/21 2200  vancomycin (VANCOREADY) IVPB 1250 mg/250 mL        1,250 mg 166.7 mL/hr over 90 Minutes Intravenous  Once 08/26/21 2159 08/27/21 0017   08/26/21 2200  ceFEPIme (MAXIPIME) 1  g in sodium chloride 0.9 % 100 mL IVPB  Status:  Discontinued        1 g 200 mL/hr over 30 Minutes Intravenous Every 24 hours 08/26/21 2159 08/30/21 0729   08/26/21 2015  cefTRIAXone (ROCEPHIN) 2 g in sodium chloride 0.9 % 100 mL IVPB        2 g 200 mL/hr over 30 Minutes Intravenous  Once 08/26/21 2012 08/26/21 2108        I have personally reviewed the following labs and images: CBC: Recent Labs  Lab 08/31/21 0108 09/01/21 0326 09/04/21 0413  WBC 8.6 9.6 10.0  HGB 8.5* 8.8* 8.9*  HCT 24.2* 25.0* 27.6*  MCV 103.4* 102.9* 109.1*  PLT 266 296 325   BMP &GFR Recent Labs  Lab 08/31/21 0108 09/01/21 0326 09/02/21 0311 09/03/21 0346 09/04/21 0413  NA 136 137 138 138 140  K 3.2*  3.3* 3.7 3.4* 3.5  CL 97* 98 98 97* 99  CO2 24 24 24 22 25   GLUCOSE 136* 130* 136* 138* 116*  BUN 66* 60* 62* 61* 60*  CREATININE 9.72* 10.17* 10.82* 11.10* 11.46*  CALCIUM 8.9 9.3 9.5 9.6 9.6  MG  --   --   --  2.4  --   PHOS 5.4* 6.2* 6.3* 7.1* 6.6*   Estimated Creatinine Clearance: 2.9 mL/min (A) (by C-G formula based on SCr of 11.46 mg/dL (H)). Liver & Pancreas: Recent Labs  Lab 08/31/21 0108 09/01/21 0326 09/02/21 0311 09/03/21 0346 09/04/21 0413  ALBUMIN 2.0* 2.1* 2.2* 2.4* 2.4*   No results for input(s): LIPASE, AMYLASE in the last 168 hours. Recent Labs  Lab 08/31/21 1000  AMMONIA 32   Diabetic: No results for input(s): HGBA1C in the last 72 hours. No results for input(s): GLUCAP in the last 168 hours. Cardiac Enzymes: No results for input(s): CKTOTAL, CKMB, CKMBINDEX, TROPONINI in the last 168 hours. No results for input(s): PROBNP in the last 8760 hours. Coagulation Profile: No results for input(s): INR, PROTIME in the last 168 hours. Thyroid Function Tests: No results for input(s): TSH, T4TOTAL, FREET4, T3FREE, THYROIDAB in the last 72 hours. Lipid Profile: No results for input(s): CHOL, HDL, LDLCALC, TRIG, CHOLHDL, LDLDIRECT in the last 72 hours. Anemia Panel: No results for input(s): VITAMINB12, FOLATE, FERRITIN, TIBC, IRON, RETICCTPCT in the last 72 hours. Urine analysis:    Component Value Date/Time   COLORURINE COLORLESS (A) 08/29/2021 0827   APPEARANCEUR CLEAR 08/29/2021 0827   LABSPEC 1.005 08/29/2021 0827   PHURINE 8.0 08/29/2021 0827   GLUCOSEU >=500 (A) 08/29/2021 0827   HGBUR NEGATIVE 08/29/2021 0827   BILIRUBINUR NEGATIVE 08/29/2021 0827   KETONESUR NEGATIVE 08/29/2021 0827   PROTEINUR 30 (A) 08/29/2021 0827   UROBILINOGEN 0.2 01/11/2014 1635   NITRITE NEGATIVE 08/29/2021 0827   LEUKOCYTESUR NEGATIVE 08/29/2021 0827   Sepsis Labs: Invalid input(s): PROCALCITONIN, [place]  Microbiology: No results found for this or any previous  visit (from the past 240 hour(s)).  Radiology Studies: No results found.    Felicia Williams T. [place]  If 7PM-7AM, please contact night-coverage [email or web address] 09/06/2021, 5:52 PM

## 2021-09-06 NOTE — Progress Notes (Signed)
PT is having frequent small mushy stools. Requests imodium. Dr Myna Hidalgo notified via secure chat.

## 2021-09-06 NOTE — TOC Progression Note (Signed)
Transition of Care [facility]) - Progression Note    Patient Details  Name: Felicia Williams MRN: [id number] Date of Birth: 04/11/1941  [facility]) CM/SW Contact  Zenon Mayo, RN Phone Number: 09/06/2021, 4:27 PM  Clinical Narrative:    We are still waiting on auth for CIR, NCM spoke with daughter [name], informed her of this information, NCM asked her about plan B if Josem Kaufmann does not come thru, she would then need to look at home with [facility].  She agreed, so will see if we get auth for CIR.  NCM offered choice for [facility] agency, she states start with 5 star rating and go on down to 4, star ,3 star etc.  NCM has made referrals, waiting to hear back.   Expected Discharge Plan: [place] Barriers to Discharge: Continued Medical Work up  Expected Discharge Plan and Services Expected Discharge Plan: [place]   Discharge Planning Services: CM Consult Post Acute Care Choice: [place] arrangements for the past 2 months: Single Family Home                 DME Arranged: Bedside commode DME Agency: [employer] Date DME Agency Contacted: 08/31/21 Time DME Agency Contacted: 3 Representative spoke with at DME Agency: [place]: RN, PT, Disease Management [place] Agency:  ([place]) Date [place]: 08/31/21 Time [place]: [id number] Representative spoke with at [place]: [place] ([place]) Interventions    Readmission Risk Interventions Readmission Risk Prevention Plan 01/20/2019  Transportation Screening Complete  PCP or Specialist Appt within 3-5 Days Complete  HRI or [place] Complete  Social Work Consult for [place] Planning/Counseling [place] Not Applicable  Medication Review Press photographer) Complete  Some recent data might be hidden

## 2021-09-06 NOTE — Progress Notes (Signed)
Mobility Specialist Progress Note:   09/06/21 1600  Mobility  Activity Ambulated with assistance in hallway  Level of Assistance Standby assist, set-up cues, supervision of patient - no hands on  Assistive Device Front wheel walker  Distance Ambulated (ft) 240 ft  Activity Response Tolerated well  $Mobility charge 1 Mobility   Pt eager for ambulation this afternoon. Pt not requiring any physical assistance throughout session. Pt pleased with distance. Left in bed with all needs met.   Nelta Numbers Acute Rehabilitation Services Phone: [phone] Office Phone: [phone]

## 2021-09-06 NOTE — Progress Notes (Signed)
Physical Therapy Treatment Patient Details Name: Felicia Williams MRN: [id number] DOB: Jun 13, 1941 Today's Date: 09/06/2021   History of Present Illness Pt is a 81 y.o. F who presents 08/26/2021 with severe abdominal pain, nausea, dry heaving. Found to have leukocytosis and lactic acid elevation. CT abd/pelvis revealed no acute intraabdominal findings. IV antibiotics started for suspicion of bacterial peritonitis. Significant PMH: ESRD on PD, HTN, HLD, spinal stenosis.    PT Comments    The pt continues to make great progress with mobility, activity tolerance, and endurance this session. She completed multiple static standing trials, but was limited to 30-45 sec and required BUE support on RW. The pt was then challenged by progressing ambulation distance in the room as well as trial of short bout of ambulation with HHA only as pt was ambulating without DME PTA. The pt required up to modA to maintain stability without use of RW and continues to ambulate with gait deficits as described below. Continue to recommend acute inpatient rehab prior to return home to facilitate return to full independence.   30 Second Sit-Stand: The patient completed 6 sit-stands in a 30 second period. ( < 11.9 for individuals aged 49 - 37 indicates below average activity tolerance and increased risk of falls)   Recommendations for follow up therapy are one component of a multi-disciplinary discharge planning process, led by the attending physician.  Recommendations may be updated based on patient status, additional functional criteria and insurance authorization.  Follow Up Recommendations  Acute inpatient rehab (3hours/day)     Assistance Recommended at Discharge Frequent or constant Supervision/Assistance  Patient can return home with the following Two people to help with walking and/or transfers;Two people to help with bathing/dressing/bathroom;Assistance with feeding;Direct supervision/assist for financial  management;Direct supervision/assist for medications management;Help with stairs or ramp for entrance;Assist for transportation   Equipment Recommendations  BSC/3in1    Recommendations for Other Services       Precautions / Restrictions Precautions Precautions: Fall Precaution Comments: tremors/jerky movements ( increasing in use of functional tasks) Restrictions Weight Bearing Restrictions: No     Mobility  Bed Mobility Overal bed mobility: Modified Independent             General bed mobility comments: pt sitting EOB upon my arrival    Transfers Overall transfer level: Needs assistance Equipment used: Rolling walker (2 wheels), 1 person hand held assist, None Transfers: Sit to/from Stand Sit to Stand: Min assist, Min guard           General transfer comment: progressed from minA to minG with continued reps. pt completing x5 in 24 seconds, x6 in 30 seconds    Ambulation/Gait Ambulation/Gait assistance: Min assist, Mod assist Gait Distance (Feet): 30 Feet (+15 ft) Assistive device: Rolling walker (2 wheels), 1 person hand held assist Gait Pattern/deviations: Step-to pattern, Decreased stride length, Decreased dorsiflexion - right, Decreased dorsiflexion - left, Shuffle, Narrow base of support, Trunk flexed Gait velocity: decreased Gait velocity interpretation: <1.31 ft/sec, indicative of household ambulator   General Gait Details: pt with small strides and trunk flexed, cues for posture. trial without DME and pt needing modA through HHA as well as reaching for furniture in room to steady      Balance Overall balance assessment: Needs assistance Sitting-balance support: Feet supported Sitting balance-Leahy Scale: Good Sitting balance - Comments: able to static sit unsupported in chair without UE support or assist.   Standing balance support: Single extremity supported, During functional activity Standing balance-Leahy Scale: Poor Standing balance comment:  BUE support preferred, able to manage short bout with single UE support and modA                            Cognition Arousal/Alertness: Awake/alert Behavior During Therapy: Flat affect Overall Cognitive Status: Impaired/Different from baseline Area of Impairment: Memory, Safety/judgement, Awareness, Problem solving                     Memory: Decreased recall of precautions, Decreased short-term memory   Safety/Judgement: Decreased awareness of safety Awareness: Intellectual Problem Solving: Slow processing, Requires verbal cues, Requires tactile cues, Difficulty sequencing General Comments: pt continues to make good improvements, able to verbalize needs but seems to have had mix up in communication between myself and RN, pt otherwise able to follow all simple cues and self-monitor for safety and fatigue        Exercises Other Exercises Other Exercises: repeated sit-stand from recliner x 6 in 30 seconds, x10 through session    General Comments General comments (skin integrity, edema, etc.): VSS on RA      Pertinent Vitals/Pain Pain Assessment Pain Assessment: No/denies pain     PT Goals (current goals can now be found in the care plan section) Acute Rehab PT Goals Patient Stated Goal: to feel better, less tremors PT Goal Formulation: With patient Time For Goal Achievement: 09/11/21 Potential to Achieve Goals: Good Progress towards PT goals: Progressing toward goals    Frequency    Min 3X/week      PT Plan Current plan remains appropriate    Co-evaluation              AM-PAC PT "6 Clicks" Mobility   Outcome Measure  Help needed turning from your back to your side while in a flat bed without using bedrails?: A Little Help needed moving from lying on your back to sitting on the side of a flat bed without using bedrails?: A Little Help needed moving to and from a bed to a chair (including a wheelchair)?: A Little Help needed standing up  from a chair using your arms (e.g., wheelchair or bedside chair)?: A Little Help needed to walk in hospital room?: A Lot Help needed climbing 3-5 steps with a railing? : A Lot 6 Click Score: 16    End of Session Equipment Utilized During Treatment: Gait belt Activity Tolerance: Patient tolerated treatment well Patient left: in chair;with call bell/phone within reach Nurse Communication: Mobility status PT Visit Diagnosis: Unsteadiness on feet (R26.81);Difficulty in walking, not elsewhere classified (R26.2);Other abnormalities of gait and mobility (R26.89);Dizziness and giddiness (R42)     Time: 7622-6333 PT Time Calculation (min) (ACUTE ONLY): 30 min  Charges:  $Therapeutic Exercise: 23-37 mins                     West Carbo, PT, DPT   Acute Rehabilitation Department Pager #: [phone]   Sandra Cockayne 09/06/2021, 10:26 AM

## 2021-09-06 NOTE — Progress Notes (Signed)
[facility] NEPHROLOGY PROGRESS NOTE  Assessment/ Plan: Pt is a 81 y.o. yo female  with ESRD on CCPD x 4 years, HL, HTN, and GERD who was admitted as transfer from [facility] with peritonitis.   CCPD, coordinated through [facility] (Dr. Holley Raring) [phone]  57 kg EDW Last ESA was dosed at home per the HD unit. Mircera is ordered for 50 mcg on 08/10/21. May be able to give again here soon.  [phone] phone number for outpatient PD RN line  # PD associated peritonitis: Culture-negative peritonitis.  Gram stain with gram-positive cocci.  Reportedly the PD fluid was cloudy and the patient received antibiotics before the sample was collected.  Initially the antibiotics started on 2/4 vancomycin and cefepime which was later changed to Ancef because of concern of encephalopathy.  Seen by neurology and there is no improvement in mental status.   The antibiotics changed to meropenem on 2/13 in order to avoid cephalosporins.  Her mental status significantly improved. The plan is to complete total 2 weeks of antibiotics with vancomycin 1 g MWF and meropenem.  #ESRD on PD: Continue CCPD tonight, all 5 exchanges 2.5%.  Monitor lab.  Discussed with the dialysis nurse.  #Acute metabolic encephalopathy: Concern for cephalosporin related neurotoxicity.  Seen by neurology.   MRI of the brain with no acute finding.  Avoiding cephalosporins and now changed to meropenem.  Mental status has significantly improved.  She is working with physical therapist.  Noted she is waiting to go to inpatient rehab.  # Anemia: On Mircera as outpatient .  We will continue weekly Aranesp.  Monitor hemoglobin.  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 Monitor lab.  # HTN/volume: BP variable.  On carvedilol.  Volume management by PD.  Subjective: The patient was seen and examined.  She looks more alert awake and participating in physical therapy.  Denies nausea, vomiting, chest pain or shortness of breath.    Objective Vital signs in last 24 hours: Vitals:   09/05/21 1140 09/05/21 1936 09/06/21 0256 09/06/21 0718  BP: 114/60 (!) 143/60 (!) 127/51 (!) 120/52  Pulse: 90 94 80 80  Resp: (!) 22 20 20 18   Temp: 98 F (36.7 C) 98.3 F (36.8 C) 98.5 F (36.9 C) 97.6 F (36.4 C)  TempSrc: Oral Oral Oral Oral  SpO2: 96% 96% 95% 94%  Weight:   52.5 kg   Height:       Weight change: 0.5 kg  Intake/Output Summary (Last 24 hours) at 09/06/2021 0840 Last data filed at 09/06/2021 0400 Gross per 24 hour  Intake 9499.86 ml  Output 9953 ml  Net -453.14 ml        Labs: Basic Metabolic Panel: Recent Labs  Lab 09/02/21 0311 09/03/21 0346 09/04/21 0413  NA 138 138 140  K 3.7 3.4* 3.5  CL 98 97* 99  CO2 24 22 25   GLUCOSE 136* 138* 116*  BUN 62* 61* 60*  CREATININE 10.82* 11.10* 11.46*  CALCIUM 9.5 9.6 9.6  PHOS 6.3* 7.1* 6.6*    Liver Function Tests: Recent Labs  Lab 09/02/21 0311 09/03/21 0346 09/04/21 0413  ALBUMIN 2.2* 2.4* 2.4*    No results for input(s): LIPASE, AMYLASE in the last 168 hours. Recent Labs  Lab 08/31/21 1000  AMMONIA 32    CBC: Recent Labs  Lab 08/31/21 0108 09/01/21 0326 09/04/21 0413  WBC 8.6 9.6 10.0  HGB 8.5* 8.8* 8.9*  HCT 24.2* 25.0* 27.6*  MCV 103.4* 102.9* 109.1*  PLT 266 296 325  Cardiac Enzymes: No results for input(s): CKTOTAL, CKMB, CKMBINDEX, TROPONINI in the last 168 hours. CBG: No results for input(s): GLUCAP in the last 168 hours.  Iron Studies: No results for input(s): IRON, TIBC, TRANSFERRIN, FERRITIN in the last 72 hours. Studies/Results: No results found.  Medications: Infusions:  sodium chloride Stopped (09/01/21 1220)   dialysis solution 2.5% low-MG/low-CA     meropenem (MERREM) IV 500 mg (09/05/21 1034)    Scheduled Medications:  (feeding supplement) PROSource Plus  30 mL Oral BID BM   carvedilol  12.5 mg Oral BID WC   Chlorhexidine Gluconate Cloth  6 each Topical Daily   darbepoetin (ARANESP) injection -  NON-DIALYSIS  100 mcg Subcutaneous Q Wed-1800   famotidine  20 mg Oral Daily   feeding supplement  237 mL Oral BID BM   gentamicin cream  1 application Topical Daily   heparin  5,000 Units Subcutaneous Q8H   multivitamin  1 tablet Oral QHS   pantoprazole  40 mg Oral Daily   sevelamer carbonate  2.4 g Oral TID WC   vancomycin variable dose per unstable renal function (pharmacist dosing)   Does not apply See admin instructions    have reviewed scheduled and prn medications.  Physical Exam: General:NAD, alert awake and working with physical therapy Heart:RRR, s1s2 nl Lungs:clear b/l, no crackle Abdomen:soft, PD catheter in place Extremities:No leg edema Dialysis Access: PD catheter  Artie Mcintyre Tanna Furry 09/06/2021,8:40 AM  LOS: 11 days

## 2021-09-06 NOTE — Progress Notes (Signed)
Nutrition Follow-up  DOCUMENTATION CODES:   Not applicable  INTERVENTION:  - Encourage good PO intake.  - Continue Renal Multivitamin w/ minerals daily - Continue Ensure Enlive po BID, each supplement provides 350 kcal and 20 grams of protein. - Continue 30 ml ProSource Plus BID, each supplement provides 100 kcals and 15 grams protein.   NUTRITION DIAGNOSIS:   Increased nutrient needs related to acute illness as evidenced by estimated needs.  Ongoing  GOAL:   Patient will meet greater than or equal to 90% of their needs  Ongoing   MONITOR:   PO intake, Supplement acceptance, Labs, Weight trends  REASON FOR ASSESSMENT:   Consult Assessment of nutrition requirement/status  ASSESSMENT:   81 y.o. female presented to the ED with complaints of peritonitis. PMH includes ESRD on PD, HTN, gastroparesis, and GERD. Pt admitted with spontaneous bacterial peritonitis w/ sepsis.  Pt with acute metabolic encephalopathy on 2/9 secondary to cefepime and peritonitis. Noted family concern for choking while laying down. SLP was following, initially downgraded diet to NPO d/t poor mentation, now on dysphagia 3, thin liquid, full supervision, reflux precautions.  PT sitting up in chair during visit. She reports doing much better. Her appetite comes and does but is improving. She states that she does not enjoy the food as it is not like home cooking but is finding foods that she enjoys. She endorses diarrhea which is relieved with imodium. Continues to drink Ensure and ProSource but expressed concern over Ensure contributing to diarrhea. Discussed drinking slowly to ensure tolerance.  Meal completions: (2/14) 50% breakfast, 75% lunch and dinner (2/15) 100% breakfast  Post dialysis weight: 52.5 kg  Medications: pepcid, rena-vit, protonix, renvella  IV drips: merrem 500mg  Q24H  Labs: BUN 60, Cr 11.46, Phos 6.6 (H)  Diet Order:   Diet Order             DIET DYS 3 Room service  appropriate? Yes; Fluid consistency: Thin  Diet effective now                   EDUCATION NEEDS:   Not appropriate for education at this time  Skin:  Skin Assessment: Reviewed RN Assessment  Last BM:  2/14 (type 6)  Height:   Ht Readings from Last 1 Encounters:  08/27/21 4\' 11"  (1.499 m)    Weight:   Wt Readings from Last 1 Encounters:  09/06/21 52.5 kg    BMI:  Body mass index is 23.38 kg/m.  Estimated Nutritional Needs:   Kcal:  1800-2000  Protein:  90-105 grams  Fluid:  1.8-2 L  Clayborne Dana, RDN, LDN Clinical Nutrition

## 2021-09-07 ENCOUNTER — Inpatient Hospital Stay (HOSPITAL_COMMUNITY)
Admission: RE | Admit: 2021-09-07 | Discharge: 2021-09-13 | DRG: 945 | Disposition: A | Discharge status: Home Health | Payer: Medicare Other | Source: Intra-hospital | Attending: Physical Medicine and Rehabilitation | Admitting: Physical Medicine and Rehabilitation

## 2021-09-07 ENCOUNTER — Encounter (HOSPITAL_COMMUNITY): Payer: Self-pay | Admitting: Physical Medicine & Rehabilitation

## 2021-09-07 ENCOUNTER — Other Ambulatory Visit: Payer: Self-pay

## 2021-09-07 ENCOUNTER — Encounter (HOSPITAL_COMMUNITY): Payer: Self-pay | Admitting: Family Medicine

## 2021-09-07 DIAGNOSIS — N186 End stage renal disease: Secondary | ICD-10-CM | POA: Diagnosis present

## 2021-09-07 DIAGNOSIS — Z881 Allergy status to other antibiotic agents status: Secondary | ICD-10-CM | POA: Diagnosis not present

## 2021-09-07 DIAGNOSIS — Z832 Family history of diseases of the blood and blood-forming organs and certain disorders involving the immune mechanism: Secondary | ICD-10-CM

## 2021-09-07 DIAGNOSIS — K659 Peritonitis, unspecified: Secondary | ICD-10-CM | POA: Diagnosis present

## 2021-09-07 DIAGNOSIS — R5381 Other malaise: Secondary | ICD-10-CM | POA: Diagnosis present

## 2021-09-07 DIAGNOSIS — Z882 Allergy status to sulfonamides status: Secondary | ICD-10-CM | POA: Diagnosis not present

## 2021-09-07 DIAGNOSIS — Z833 Family history of diabetes mellitus: Secondary | ICD-10-CM

## 2021-09-07 DIAGNOSIS — Z992 Dependence on renal dialysis: Secondary | ICD-10-CM

## 2021-09-07 DIAGNOSIS — Z6823 Body mass index (BMI) 23.0-23.9, adult: Secondary | ICD-10-CM

## 2021-09-07 DIAGNOSIS — L299 Pruritus, unspecified: Secondary | ICD-10-CM | POA: Diagnosis not present

## 2021-09-07 DIAGNOSIS — E440 Moderate protein-calorie malnutrition: Secondary | ICD-10-CM | POA: Diagnosis present

## 2021-09-07 DIAGNOSIS — Z8249 Family history of ischemic heart disease and other diseases of the circulatory system: Secondary | ICD-10-CM

## 2021-09-07 DIAGNOSIS — G928 Other toxic encephalopathy: Secondary | ICD-10-CM | POA: Diagnosis not present

## 2021-09-07 DIAGNOSIS — K3184 Gastroparesis: Secondary | ICD-10-CM | POA: Diagnosis present

## 2021-09-07 DIAGNOSIS — T3695XA Adverse effect of unspecified systemic antibiotic, initial encounter: Secondary | ICD-10-CM | POA: Diagnosis present

## 2021-09-07 DIAGNOSIS — M898X9 Other specified disorders of bone, unspecified site: Secondary | ICD-10-CM | POA: Diagnosis present

## 2021-09-07 DIAGNOSIS — K521 Toxic gastroenteritis and colitis: Secondary | ICD-10-CM | POA: Diagnosis present

## 2021-09-07 DIAGNOSIS — T361X5A Adverse effect of cephalosporins and other beta-lactam antibiotics, initial encounter: Secondary | ICD-10-CM | POA: Diagnosis not present

## 2021-09-07 DIAGNOSIS — G929 Unspecified toxic encephalopathy: Secondary | ICD-10-CM | POA: Diagnosis present

## 2021-09-07 DIAGNOSIS — T8571XA Infection and inflammatory reaction due to peritoneal dialysis catheter, initial encounter: Secondary | ICD-10-CM | POA: Diagnosis present

## 2021-09-07 DIAGNOSIS — E782 Mixed hyperlipidemia: Secondary | ICD-10-CM | POA: Diagnosis present

## 2021-09-07 DIAGNOSIS — Y841 Kidney dialysis as the cause of abnormal reaction of the patient, or of later complication, without mention of misadventure at the time of the procedure: Secondary | ICD-10-CM | POA: Diagnosis present

## 2021-09-07 DIAGNOSIS — I120 Hypertensive chronic kidney disease with stage 5 chronic kidney disease or end stage renal disease: Secondary | ICD-10-CM | POA: Diagnosis present

## 2021-09-07 DIAGNOSIS — M109 Gout, unspecified: Secondary | ICD-10-CM | POA: Diagnosis present

## 2021-09-07 DIAGNOSIS — T8029XA Infection following other infusion, transfusion and therapeutic injection, initial encounter: Secondary | ICD-10-CM | POA: Diagnosis present

## 2021-09-07 DIAGNOSIS — D631 Anemia in chronic kidney disease: Secondary | ICD-10-CM | POA: Diagnosis present

## 2021-09-07 DIAGNOSIS — G4700 Insomnia, unspecified: Secondary | ICD-10-CM | POA: Diagnosis not present

## 2021-09-07 DIAGNOSIS — K219 Gastro-esophageal reflux disease without esophagitis: Secondary | ICD-10-CM | POA: Diagnosis present

## 2021-09-07 DIAGNOSIS — I959 Hypotension, unspecified: Secondary | ICD-10-CM | POA: Diagnosis not present

## 2021-09-07 DIAGNOSIS — R197 Diarrhea, unspecified: Secondary | ICD-10-CM

## 2021-09-07 DIAGNOSIS — R531 Weakness: Secondary | ICD-10-CM

## 2021-09-07 MED ORDER — SODIUM CHLORIDE 0.9 % IV SOLN
500.0000 mg | INTRAVENOUS | Status: AC
  Administered 2021-09-08: 500 mg via INTRAVENOUS
  Filled 2021-09-07: qty 10

## 2021-09-07 MED ORDER — RENA-VITE PO TABS
1.0000 | ORAL_TABLET | Freq: Every day | ORAL | Status: DC
  Administered 2021-09-07 – 2021-09-12 (×6): 1 via ORAL
  Filled 2021-09-07 (×6): qty 1

## 2021-09-07 MED ORDER — ACETAMINOPHEN 325 MG PO TABS: 325.0000 mg | ORAL_TABLET | ORAL | Status: DC | PRN

## 2021-09-07 MED ORDER — ENSURE ENLIVE PO LIQD
237.0000 mL | Freq: Two times a day (BID) | ORAL | Status: DC
  Administered 2021-09-08 – 2021-09-12 (×9): 237 mL via ORAL

## 2021-09-07 MED ORDER — MEROPENEM 500 MG IV SOLR
500.0000 mg | Freq: Once | INTRAVENOUS | Status: DC
Start: 2021-09-08 — End: 2021-09-13

## 2021-09-07 MED ORDER — GENTAMICIN SULFATE 0.1 % EX CREA: 1.0000 "application " | TOPICAL_CREAM | Freq: Every day | CUTANEOUS | 0 refills | Status: DC

## 2021-09-07 MED ORDER — BISACODYL 10 MG RE SUPP: 10.0000 mg | Freq: Every day | RECTAL | Status: DC | PRN

## 2021-09-07 MED ORDER — PROSOURCE PLUS PO LIQD
30.0000 mL | Freq: Two times a day (BID) | ORAL | Status: DC
  Administered 2021-09-08 – 2021-09-11 (×6): 30 mL via ORAL
  Filled 2021-09-07 (×4): qty 30

## 2021-09-07 MED ORDER — GENTAMICIN SULFATE 0.1 % EX CREA
1.0000 "application " | TOPICAL_CREAM | Freq: Every day | CUTANEOUS | Status: DC
  Administered 2021-09-08: 1 via TOPICAL
  Filled 2021-09-07: qty 15

## 2021-09-07 MED ORDER — ALUMINUM HYDROXIDE GEL 320 MG/5ML PO SUSP
10.0000 mL | Freq: Four times a day (QID) | ORAL | Status: DC | PRN
  Filled 2021-09-07: qty 30

## 2021-09-07 MED ORDER — SIMETHICONE 80 MG PO CHEW: 80.0000 mg | CHEWABLE_TABLET | Freq: Four times a day (QID) | ORAL | Status: DC | PRN

## 2021-09-07 MED ORDER — DARBEPOETIN ALFA 100 MCG/0.5ML IJ SOSY: 100.0000 ug | PREFILLED_SYRINGE | INTRAMUSCULAR | Status: DC

## 2021-09-07 MED ORDER — SEVELAMER CARBONATE 2.4 G PO PACK
2.4000 g | PACK | Freq: Three times a day (TID) | ORAL | Status: DC
  Administered 2021-09-08 – 2021-09-13 (×13): 2.4 g via ORAL
  Filled 2021-09-07 (×18): qty 1

## 2021-09-07 MED ORDER — ACETAMINOPHEN 325 MG PO TABS: 650.0000 mg | ORAL_TABLET | Freq: Four times a day (QID) | ORAL | Status: DC | PRN

## 2021-09-07 MED ORDER — HEPARIN SODIUM (PORCINE) 5000 UNIT/ML IJ SOLN
5000.0000 [IU] | Freq: Three times a day (TID) | INTRAMUSCULAR | Status: DC
  Administered 2021-09-07 – 2021-09-13 (×17): 5000 [IU] via SUBCUTANEOUS
  Filled 2021-09-07 (×15): qty 1

## 2021-09-07 MED ORDER — PROSOURCE PLUS PO LIQD: 30.0000 mL | Freq: Two times a day (BID) | ORAL | Status: DC

## 2021-09-07 MED ORDER — DARBEPOETIN ALFA 100 MCG/0.5ML IJ SOSY
100.0000 ug | PREFILLED_SYRINGE | Freq: Once | INTRAMUSCULAR | Status: AC
  Administered 2021-09-07: 100 ug via SUBCUTANEOUS
  Filled 2021-09-07: qty 0.5

## 2021-09-07 MED ORDER — PROCHLORPERAZINE EDISYLATE 10 MG/2ML IJ SOLN
5.0000 mg | Freq: Four times a day (QID) | INTRAMUSCULAR | Status: DC | PRN
  Filled 2021-09-07: qty 2

## 2021-09-07 MED ORDER — SORBITOL 70 % SOLN: 60.0000 mL | Freq: Every day | Status: DC | PRN

## 2021-09-07 MED ORDER — CARVEDILOL 12.5 MG PO TABS
12.5000 mg | ORAL_TABLET | Freq: Two times a day (BID) | ORAL | Status: DC
  Administered 2021-09-08 – 2021-09-09 (×4): 12.5 mg via ORAL
  Filled 2021-09-07 (×5): qty 1

## 2021-09-07 MED ORDER — LOPERAMIDE HCL 2 MG PO CAPS
2.0000 mg | ORAL_CAPSULE | Freq: Two times a day (BID) | ORAL | Status: DC | PRN
  Administered 2021-09-08 – 2021-09-10 (×3): 2 mg via ORAL
  Filled 2021-09-07 (×3): qty 1

## 2021-09-07 MED ORDER — CAMPHOR-MENTHOL 0.5-0.5 % EX LOTN
TOPICAL_LOTION | CUTANEOUS | Status: DC | PRN
  Filled 2021-09-07: qty 222

## 2021-09-07 MED ORDER — DELFLEX-LC/2.5% DEXTROSE 394 MOSM/L IP SOLN: INTRAPERITONEAL | Status: DC

## 2021-09-07 MED ORDER — GUAIFENESIN-DM 100-10 MG/5ML PO SYRP: 5.0000 mL | ORAL_SOLUTION | Freq: Four times a day (QID) | ORAL | Status: DC | PRN

## 2021-09-07 MED ORDER — ENSURE ENLIVE PO LIQD: 237.0000 mL | Freq: Two times a day (BID) | ORAL | 12 refills | Status: DC

## 2021-09-07 MED ORDER — FAMOTIDINE 20 MG PO TABS
20.0000 mg | ORAL_TABLET | Freq: Every day | ORAL | Status: DC
  Administered 2021-09-08 – 2021-09-13 (×6): 20 mg via ORAL
  Filled 2021-09-07 (×6): qty 1

## 2021-09-07 MED ORDER — POLYETHYLENE GLYCOL 3350 17 G PO PACK
17.0000 g | PACK | Freq: Every day | ORAL | Status: DC | PRN
Start: 2021-09-07 — End: 2021-09-13

## 2021-09-07 MED ORDER — LOPERAMIDE HCL 2 MG PO CAPS: 2.0000 mg | ORAL_CAPSULE | ORAL | 0 refills | Status: DC | PRN

## 2021-09-07 MED ORDER — TRAZODONE HCL 50 MG PO TABS
25.0000 mg | ORAL_TABLET | Freq: Every evening | ORAL | Status: DC | PRN
  Administered 2021-09-08 – 2021-09-12 (×3): 50 mg via ORAL
  Filled 2021-09-07 (×3): qty 1

## 2021-09-07 MED ORDER — PROCHLORPERAZINE 25 MG RE SUPP: 12.5000 mg | Freq: Four times a day (QID) | RECTAL | Status: DC | PRN

## 2021-09-07 MED ORDER — PROCHLORPERAZINE MALEATE 5 MG PO TABS
5.0000 mg | ORAL_TABLET | Freq: Four times a day (QID) | ORAL | Status: DC | PRN
  Filled 2021-09-07: qty 2

## 2021-09-07 MED ORDER — DIPHENHYDRAMINE HCL 12.5 MG/5ML PO ELIX: 12.5000 mg | ORAL_SOLUTION | Freq: Four times a day (QID) | ORAL | Status: DC | PRN

## 2021-09-07 MED ORDER — HEPARIN SODIUM (PORCINE) 5000 UNIT/ML IJ SOLN: 5000.0000 [IU] | Freq: Three times a day (TID) | INTRAMUSCULAR | Status: DC

## 2021-09-07 NOTE — H&P (Signed)
Physical Medicine and Rehabilitation Admission H&P    Chief Complaint  Patient presents with   Functional deficits due to encephalopathy/debility.     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 She was found to be septic due to peritonitis with lactic acidosis, milky fluid with 8667 WBC and mesothelial cells. She was started on Vanc/Cefepime for treatment of PD associated peritonitis.   BC/peritoneal cultures negative.  Hospital course significant for encephalopathy with lethargy with non verbal state, myoclonus, confusion and hallucinations that started on 02/09. Cefepime changed to ancef without improvement. Dr. Leonel Ramsay consulted for input. MRI brain negative. EEG negative for seizures and showed frequent runs of induced sharp GPDs c/w severe toxic/metabolic encephalopathy.   Confusion gradually resolved with improvement in alertness and verbal output back to baseline. Continues on Meropenum and Vanc to complete 2 week course of antibiotic regimen. Tremors/jerks with activity improving but she continue to be limited by weakness with unsteady gait, fatigue and STM deficits with poor safety awareness. CIR recommended due to functional decline.   Pt reports no myoclonus activity seen- nurse agrees for today.  Denies pain currently, but had this AM when was walking- chronic LBP. Tylenol doesn't help and so avoids meds.  LBM this AM- loose stools- actually asked for imodium while I was there.  Almost anuric per pt- rare formation of urine.     Review of Systems  Constitutional:  Negative for malaise/fatigue.       Has poor appetite--drinks 1/2 container of ensure twice a day at home.   HENT:  Negative for hearing loss and tinnitus.   Respiratory:  Negative for cough and shortness  of breath.   Cardiovascular:  Negative for chest pain.  Gastrointestinal:  Positive for diarrhea (loose stools 3-4 times a day).  Genitourinary:  Negative for dysuria.       Voids small amount daily  Musculoskeletal:  Positive for back pain and myalgias.  Neurological:  Positive for sensory change (numbness bilateral feet) and weakness.  Psychiatric/Behavioral:  The patient is not nervous/anxious.   All other systems reviewed and are negative.   Past Medical History:  Diagnosis Date   Anemia in stage 4 chronic kidney disease ([place]) 08/18/2018   Depression    Essential hypertension, benign    GERD (gastroesophageal reflux disease)    Glomerulonephritis    Gout    Mixed hyperlipidemia    Renal insufficiency     Past Surgical History:  Procedure Laterality Date   APPENDECTOMY     CATARACT EXTRACTION     COLONOSCOPY N/A 12/28/2015   Procedure: COLONOSCOPY;  Surgeon: Rogene Houston, MD;  Location: [facility];  Service: Endoscopy;  Laterality: N/A;  815   ESOPHAGOGASTRODUODENOSCOPY N/A 11/16/2020   Procedure: ESOPHAGOGASTRODUODENOSCOPY (EGD);  Surgeon: Rogene Houston, MD;  Location: [facility];  Service: Endoscopy;  Laterality: N/A;  1:15   IR FLUORO GUIDE CV LINE RIGHT  01/16/2019   IR REMOVAL TUN CV CATH W/O FL  07/01/2019   IR US GUIDE VASC ACCESS RIGHT  01/16/2019   POLYPECTOMY  11/16/2020   Procedure: POLYPECTOMY;  Surgeon: Rogene Houston, MD;  Location: [facility];  Service: Endoscopy;;  gastric   TUBAL LIGATION      Family History  Problem Relation Age of Onset  CAD Father    Heart attack Father    Diabetes Mellitus II Father    Hypertension Father    Lupus Brother     Social History: Granddaughter (42 years old) lives with her. Retired--used to work at [employer]. She reports that she has never smoked. She has never used smokeless tobacco. She reports that she does not drink alcohol and does not use drugs.   Allergies  Allergen Reactions   Cefepime  Other (See Comments)    Feb 2023 Encephalopathy with questionable seizures. Seems to be tolerating cefazolin    Sulfa Antibiotics Other (See Comments)    Shut pt's kidneys down    Medications Prior to Admission  Medication Sig Dispense Refill   carvedilol (COREG) 12.5 MG tablet Take 12.5 mg by mouth daily.     losartan (COZAAR) 50 MG tablet Take 50 mg by mouth at bedtime.     multivitamin (RENA-VIT) TABS tablet Take 1 tablet by mouth at bedtime. 30 tablet 0   calcium carbonate (TUMS - DOSED IN MG ELEMENTAL CALCIUM) 500 MG chewable tablet Chew 1 tablet (200 mg of elemental calcium total) by mouth 2 (two) times daily with a meal. (Patient not taking: Reported on 08/27/2021) 60 tablet 0   famotidine (PEPCID) 20 MG tablet Take 1 tablet (20 mg total) by mouth at bedtime. (Patient not taking: Reported on 08/27/2021)     lansoprazole (PREVACID) 30 MG capsule Take 1 capsule (30 mg total) by mouth daily before breakfast. (Patient not taking: Reported on 08/27/2021) 30 capsule 5      Home: [place] expects to be discharged to:: Private residence Living Arrangements:  Felicia Williams, daughter to assist) Available Help at Discharge: Family, Available 24 hours/day Type of Home: House Home Access: Stairs to enter [employer] of Steps: 1 Entrance Stairs-Rails: None Home Layout: Able to live on main level with bedroom/bathroom, Laundry or work area in basement [employer] Shower/Tub: Chiropodist: Standard Bathroom Accessibility: Yes (daughter/pt think a walker should be able to fit into the bathroom) Home Equipment: [place] - single point, Conservation officer, nature (2 wheels), Wheelchair - manual  Lives With: Family   Functional History: Prior Function Prior Level of Function : Independent/Modified Independent, Driving  Functional Status:  Mobility: Bed Mobility Overal bed mobility:  (presented sitting in chair) Bed Mobility: Supine to Sit Rolling: Max assist Supine to  sit: Supervision, HOB elevated (head at 90 degrees) Sit to supine: Mod assist General bed mobility comments: pt sitting EOB upon my arrival Transfers Overall transfer level: Needs assistance Equipment used: Rolling walker (2 wheels) Transfers: Sit to/from Stand Sit to Stand: Min guard General transfer comment: progressed from minA to minG with continued reps. pt completing x5 in 24 seconds, x6 in 30 seconds Ambulation/Gait Ambulation/Gait assistance: Min assist, Mod assist Gait Distance (Feet): 30 Feet (+15 ft) Assistive device: Rolling walker (2 wheels), 1 person hand held assist Gait Pattern/deviations: Step-to pattern, Decreased stride length, Decreased dorsiflexion - right, Decreased dorsiflexion - left, Shuffle, Narrow base of support, Trunk flexed General Gait Details: pt with small strides and trunk flexed, cues for posture. trial without DME and pt needing modA through HHA as well as reaching for furniture in room to steady Gait velocity: decreased Gait velocity interpretation: <1.31 ft/sec, indicative of household ambulator    ADL: ADL Overall ADL's : Needs assistance/impaired Eating/Feeding: Set up, Sitting Eating/Feeding Details (indicate cue type and reason): Pt noted to start to gag/cough with oral movements at this time but did not  have anything in mouth. SLP came into room and described what was occuring at this time Grooming: Wash/dry hands, Min guard, Cueing for safety, Cueing for sequencing, Standing Upper Body Bathing: Set up, Sitting Lower Body Bathing: Minimal assistance, Cueing for sequencing, Cueing for safety, Sit to/from stand Upper Body Dressing : Set up, Sitting Lower Body Dressing: Minimal assistance, Cueing for safety, Cueing for sequencing, Sit to/from stand Toilet Transfer: Min guard, Cueing for safety, Cueing for sequencing, Rolling walker (2 wheels) Toileting- Clothing Manipulation and Hygiene: Min guard, Cueing for safety, Cueing for sequencing, Sit  to/from stand Toileting - Clothing Manipulation Details (indicate cue type and reason): Pt was able to start post set up but fatigued in standing and requires assist to have good quality of hygiene Functional mobility during ADLs: Min guard, Rolling walker (2 wheels) General ADL Comments: Pt reporting they feel like they have sea legs with prolonged standing  Cognition: Cognition Overall Cognitive Status: Impaired/Different from baseline Orientation Level: Oriented X4 Cognition Arousal/Alertness: Awake/alert Behavior During Therapy: WFL for tasks assessed/performed Overall Cognitive Status: Impaired/Different from baseline Area of Impairment: Safety/judgement, Problem solving Orientation Level: Disoriented to, Place, Time, Situation Current Attention Level: Focused Memory: Decreased recall of precautions, Decreased short-term memory Following Commands: Follows multi-step commands consistently Safety/Judgement: Decreased awareness of safety Awareness: Intellectual Problem Solving: Slow processing, Requires verbal cues, Requires tactile cues, Difficulty sequencing General Comments: Pt reported they were attempting to get by themself but unclear if accurate or not as the daughter from prior visit reported in the evenings noted thy were sundowning some in evening hours Difficult to assess due to: Impaired communication   Blood pressure (!) 121/59, pulse 83, temperature 98 F (36.7 C), temperature source Oral, resp. rate 18, height 4\' 11"  (1.499 m), weight 55.1 kg, SpO2 96 %. Physical Exam Vitals and nursing note reviewed. Exam conducted with a chaperone present.  Constitutional:      Appearance: She is well-developed.     Comments: Elderly pt who appears younger than stated age sitting up in bedside chair; nurse in room; NAD  HENT:     Head: Normocephalic and atraumatic.     Comments: Smile equal    Right Ear: External ear normal.     Left Ear: External ear normal.     Nose: Nose  normal. No congestion.     Mouth/Throat:     Mouth: Mucous membranes are dry.     Pharynx: Oropharynx is clear. No oropharyngeal exudate.  Eyes:     General:        Right eye: No discharge.        Left eye: No discharge.     Extraocular Movements: Extraocular movements intact.  Cardiovascular:     Rate and Rhythm: Normal rate and regular rhythm.     Heart sounds: Normal heart sounds. No murmur heard.   No gallop.  Pulmonary:     Effort: Pulmonary effort is normal. No respiratory distress.     Breath sounds: Normal breath sounds. No wheezing or rales.  Abdominal:     General: There is distension. There is no abdominal bruit.     Palpations: Abdomen is soft.     Comments: Hyperactive BS- slightly distended/soft  Musculoskeletal:     Cervical back: Neck supple. No tenderness.     Comments: 5-/5 in UE B/L  LE's- 5-/5 except 4+/5 in DF and PF B/L Limited due to pt's delayed responses  Skin:    Comments: PD catheter in abd L forearm IV- looks  OK   Neurological:     Mental Status: She is alert and oriented to person, place, and time.     Comments: Delayed responses- Ox3, however very vague and tries to answer questions slowly No myoclonic activity seen  Psychiatric:     Comments: flat    Results for orders placed or performed during the hospital encounter of 08/26/21 (from the past 48 hour(s))  Vancomycin, random     Status: None   Collection Time: 09/06/21  3:52 AM  Result Value Ref Range   Vancomycin Rm 26     Comment:        Random Vancomycin therapeutic range is dependent on dosage and time of specimen collection. A peak range is 20.0-40.0 ug/mL A trough range is 5.0-15.0 ug/mL        Performed at [facility] [address], [place], [place] [address]    No results found.    Blood pressure (!) 121/59, pulse 83, temperature 98 F (36.7 C), temperature source Oral, resp. rate 18, height 4\' 11"  (1.499 m), weight 55.1 kg, SpO2 96 %.  Medical Problem List  and Plan: 1. Functional deficits secondary to Toxic metabolic encephalopathy  -patient may not shower- due to PD catheter  -ELOS/Goals: 10-14 days supervision 2.  Antithrombotics: -DVT/anticoagulation:  Pharmaceutical: Heparin  -antiplatelet therapy: n/a 3. Pain Management:  N/a 4. Mood: LCSW to follow for evaluation and support.   -antipsychotic agents: N/A 5. Neuropsych: This patient may be intermittently capable of making decisions on her own behalf. 6. Skin/Wound Care: Routine pressure relief measure  --daily care to PD site 7 . Fluids/Electrolytes/Nutrition:  Monitor I/O. Check CMET in am 8. Peritonitis/sepsis: Vancomycin 1 gram MWF with meropenum to continue thorough 02/17 9. Cefepime induced neurotoxicity/encephalopathy: Has cleared but still not back to baseline per daughter.  10. Diarrhea: Related to antibiotics and/or ensure.  -continue prosource BID.  11. ESRD: Continue PD at the end of the day to help with tolerance of activity 12. Anemia of chronic disease: On aranesp weekly.   --continue to monitor H/H with routine checks.  13. Gastroparesis/GERD?: Continue pepcid BID. Continue lomotil bid prn.  --Discontinue protonix due to question SE of diarrhea.  --Was managed without meds at home.  14. HTN: Monitor BP TID--continue coreg bid.  15. Poor appetite- is acute on chronic- might need appetite stimulant   I have personally performed a face to face diagnostic evaluation of this patient and formulated the key components of the plan.  Additionally, I have personally reviewed laboratory data, imaging studies, as well as relevant notes and concur with the physician assistant's documentation above.   The patient's status has not changed from the original H&P.  Any changes in documentation from the acute care chart have been noted above.         Bary Leriche, PA-C 09/07/2021

## 2021-09-07 NOTE — Plan of Care (Signed)

## 2021-09-07 NOTE — Discharge Summary (Signed)
Physician Discharge Summary   Patient: Felicia Williams MRN: [id number] DOB: January 18, 1941  Admit date:     08/26/2021  Discharge date: 09/07/21  Discharge Physician: Mercy Riding   PCP: [name], NP   Recommendations at discharge:   Complete course of meropenem on 2/17. Recheck renal panel and CBC in 1 week. Continue peritoneal dialysis per nephrology Continue peritoneal dialysis catheter care  Discharge Diagnoses: Principal Problem:   Sepsis due to peritonitis associated with peritoneal dialysis [facility]) Active Problems:   End stage renal disease (HCC)   Diarrhea   Essential hypertension, benign   Macrocytic anemia   Gastroesophageal reflux disease   Pruritus   Generalized weakness  Resolved Problems:   N&V (nausea and vomiting)   Sepsis (HCC)   Shortness of breath   Acute metabolic encephalopathy   Hypokalemia   Hospital Course: Felicia Williams is an 81 y.o. female with a history of ESRD on PD, HTN, HLD, spinal stenosis, and GERD who presented to the ED at [facility] 2/4 with severe abdominal pain, nausea and dry heaving. She was found to have leukocytosis, lactic acid elevation. CT abd/pelvis revealed no acute intraabdominal findings. IV antibiotics were started for suspicion of bacterial peritonitis and the patient was admitted to [facility]. Peritoneal fluid appeared milky with neutrophil count 8,667 and GPC on gram stain.  Peritoneal fluid cultures remain negative to date.  Treating for PD associated peritonitis.  Acute metabolic encephalopathy, suspected due to cefepime and peritonitis, cefepime discontinued.  Nephrology on board.  AMS worse 2/9, CT head without acute abnormalities, neurology consulted, having myoclonic jerks, LTM EEG suggestive of toxic metabolic encephalopathy.   All cephalosporins discontinued due to some risk of neurotoxicity.  Meropenem initiated 2/13 and she will complete 2 weeks of total antibiotic course on 2/17.  His vancomycin levels remained within  therapeutic range. Encephalopathy resolved.  She is discharged to CIR for intensive rehabilitation.  She will continue peritoneal dialysis at [facility].  Assessment and Plan: * Sepsis due to peritonitis associated with peritoneal dialysis [facility])- (present on admission) She had abdominal pain with milky peritoneal fluid and GPC on Gram stain but fluid and blood culture negative. -Vanco and cefepime>> vanco and meropenem through 2/17 to complete 2 weeks course per ID.  End stage renal disease ([place])- (present on admission) Peritoneal dialysis per nephrology.  Diarrhea Abdominal exam benign.  Low suspicion for C. difficile.  No fever or leukocytosis.  She is on feeding supplement which might contribute. -Encouraged to use Imodium. -May consider decreasing feeding supplements if she continues to have diarrhea.  Generalized weakness Continue PT/OT at CIR.  Pruritus- (present on admission) -Moisturizing lotion ordered. TBili wnl, BUN not severely/acutely elevated.   Gastroesophageal reflux disease- (present on admission) -Continue  H2 blockers, prior home meds.  Macrocytic anemia- (present on admission) Recent Labs    11/16/20 1427 01/16/21 1408 08/26/21 1814 08/27/21 0120 08/28/21 0804 08/29/21 0353 08/30/21 0217 08/31/21 0108 09/01/21 0326 09/04/21 0413  HGB 11.6* 12.6 9.7* 7.7* 7.8* 7.3* 7.3* 8.5* 8.8* 8.9*  H&H stable. Anemia panel consistent with anemia of chronic disease.  Essential hypertension, benign- (present on admission) Normotensive. -Continue Coreg.  Hypokalemia-resolved as of 09/07/2021 Resolved.  Acute metabolic encephalopathy-resolved as of 09/07/2021 Felt to be neurotoxicity due to cephalosporin.  Other encephalopathy work-up including CT head, MRI brain, LTM EEG, ammonia, TSH, RPR and B12 unrevealing.  Encephalopathy resolved after stopping cephalosporin. -Reorientation and delirium precautions  Shortness of breath-resolved as of 09/07/2021 Resolved.  Sepsis  (HCC)-resolved as of 09/07/2021, (  present on admission) Sepsis has resolved.  N&V (nausea and vomiting)-resolved as of 09/07/2021, (present on admission) Resolved.          Consultants: Nephrology, infectious disease Procedures performed: None Disposition: Rehabilitation facility (CIR) Diet recommendation:  Discharge Diet Orders (From admission, onward)     Start     Ordered   09/07/21 0000  Diet - low sodium heart healthy       Comments: Renal diet with fluid restriction to less than 1200 cc a day   09/07/21 1219           Renal diet  DISCHARGE MEDICATION: Allergies as of 09/07/2021       Reactions   Cefepime Other (See Comments)   Feb 2023 Encephalopathy with questionable seizures. Seems to be tolerating cefazolin    Sulfa Antibiotics Other (See Comments)   Shut pt's kidneys down        Medication List     STOP taking these medications    calcium carbonate 500 MG chewable tablet Commonly known as: TUMS - dosed in mg elemental calcium   lansoprazole 30 MG capsule Commonly known as: Prevacid       TAKE these medications    (feeding supplement) PROSource Plus liquid Take 30 mLs by mouth 2 (two) times daily between meals.   feeding supplement Liqd Take 237 mLs by mouth 2 (two) times daily between meals.   acetaminophen 325 MG tablet Commonly known as: TYLENOL Take 2 tablets (650 mg total) by mouth every 6 (six) hours as needed for mild pain (or Fever >/= 101).   carvedilol 12.5 MG tablet Commonly known as: COREG Take 12.5 mg by mouth daily.   Darbepoetin Alfa 100 MCG/0.5ML Sosy injection Commonly known as: ARANESP Inject 0.5 mLs (100 mcg total) into the skin every Thursday at 6pm. Start taking on: September 14, 2021   famotidine 20 MG tablet Commonly known as: Pepcid Take 1 tablet (20 mg total) by mouth at bedtime.   gentamicin cream 0.1 % Commonly known as: GARAMYCIN Apply 1 application topically daily. Start taking on: September 08, 2021   loperamide 2 MG capsule Commonly known as: IMODIUM Take 1 capsule (2 mg total) by mouth as needed for diarrhea or loose stools.   losartan 50 MG tablet Commonly known as: COZAAR Take 50 mg by mouth at bedtime.   meropenem 500 mg in sodium chloride 0.9 % 100 mL Inject 500 mg into the vein once for 1 dose. Start taking on: September 08, 2021   multivitamin Tabs tablet Take 1 tablet by mouth at bedtime.               Durable Medical Equipment  (From admission, onward)           Start     Ordered   08/30/21 1546  For home use only DME Bedside commode  Once       Question:  Patient needs a bedside commode to treat with the following condition  Answer:  Weakness   08/30/21 1546              Discharge Care Instructions  (From admission, onward)           Start     Ordered   09/07/21 0000  Discharge wound care:       Comments: Clean skin near exit site with chloraprep swab sticks.  Starting at catheter, use circular pattern around exit site, moving towards outer edges of area covered by dressing.  Apply gentamicin cream to site once daily.  Cover with dry dressing.   09/07/21 1219            Follow-up Information     [facility] Follow up.   Why: bedside commode Contact information: [place] [address] [phone]         [place] [place] Follow up.   Specialty: Home Health Services Why: new name is [facility] [place], [facility] Contact information: [place] [address] [phone]                 Discharge Exam: Filed Weights   09/05/21 0342 09/06/21 0256 09/07/21 0416  Weight: 52.9 kg 52.5 kg 55.1 kg   Vitals:   09/06/21 1135 09/06/21 2128 09/07/21 0416 09/07/21 0730  BP: 118/67 (!) 153/67 (!) 121/59   Pulse: 78 88 83   Resp: 18 18 18 18   Temp: 97.7 F (36.5 C) 98.2 F (36.8 C) 98.4 F (36.9 C) 98 F (36.7 C)  TempSrc: Oral Oral Oral Oral  SpO2:  100% 98% 96%   Weight:   55.1 kg   Height:        GENERAL: No apparent distress.  Nontoxic. HEENT: MMM.  Vision and hearing grossly intact.  NECK: Supple.  No apparent JVD.  RESP: 98 % on RA.  No IWOB.  Fair aeration bilaterally. CVS:  RRR. Heart sounds normal.  ABD/GI/GU: BS+. Abd soft, NTND.  PD catheter in place. MSK/EXT:  Moves extremities. No apparent deformity. No edema.  SKIN: no apparent skin lesion or wound NEURO: Awake and alert. Oriented appropriately.  No apparent focal neuro deficit. PSYCH: Calm. Normal affect.   Condition at discharge: good  The results of significant diagnostics from this hospitalization (including imaging, microbiology, ancillary and laboratory) are listed below for reference.   Imaging Studies: CT ABDOMEN PELVIS WO CONTRAST  Result Date: 08/26/2021 CLINICAL DATA:  Abdominal pain, acute, nonlocalized. End-stage renal disease on peritoneal dialysis. Remote history peritonitis. EXAM: CT ABDOMEN AND PELVIS WITHOUT CONTRAST TECHNIQUE: Multidetector CT imaging of the abdomen and pelvis was performed following the standard protocol without IV contrast. RADIATION DOSE REDUCTION: This exam was performed according to the departmental dose-optimization program which includes automated exposure control, adjustment of the mA and/or kV according to patient size and/or use of iterative reconstruction technique. COMPARISON:  None. FINDINGS: Lower chest: Mild bibasilar pulmonary fibrotic change. 5 mm subpleural pulmonary nodule within the right middle lobe is indeterminate. Moderate multi-vessel coronary artery calcification. Cardiac size within normal limits. Small hiatal hernia. Hepatobiliary: No focal liver abnormality is seen. No gallstones, gallbladder wall thickening, or biliary dilatation. Pancreas: Unremarkable Spleen: Unremarkable Adrenals/Urinary Tract: The adrenal glands are unremarkable. The kidneys are atrophic bilaterally in keeping with given history of chronic  renal failure. No contour deforming renal masses identified. Simple cortical cyst noted within the upper pole of the left kidney. No hydronephrosis. No intrarenal or ureteral masses. The bladder is decompressed. Stomach/Bowel: Moderate ascites, nonspecific in the setting of peritoneal dialysis. Right lower quadrant peritoneal dialysis catheter seen coiled within the left anterior hemipelvis. The stomach, small bowel, and large bowel are unremarkable. Appendix absent. No free intraperitoneal gas. Vascular/Lymphatic: Extensive aortoiliac atherosclerotic calcification. No aortic aneurysm. Prominent atherosclerotic calcification at the origin of the superior mesenteric artery are most certainly results in hemodynamically significant stenosis, though this is not well assessed on this non arteriographic study. No pathologic adenopathy within the abdomen and pelvis. Reproductive: Uterus and  bilateral adnexa are unremarkable. Other: Tiny bilateral fat containing inguinal hernias. Rectum unremarkable. Musculoskeletal: Degenerative changes are seen within the a lumbar spine. No acute bone abnormality. No lytic or blastic bone lesion. IMPRESSION: No acute intra-abdominal pathology identified. No definite radiographic explanation for the patient's reported symptoms. 5 mm right solid pulmonary nodule. No routine follow-up imaging is recommended per Fleischner Society Guidelines. These guidelines do not apply to immunocompromised patients and patients with cancer. Follow up in patients with significant comorbidities as clinically warranted. For lung cancer screening, adhere to Lung-RADS guidelines. Reference: Radiology. 2017; 284(1):228-43. Moderate multi-vessel coronary artery calcification. Marked renal atrophy in keeping with chronic renal failure. Findings in keeping with peritoneal dialysis noted. Peripheral vascular disease. If there is clinical evidence of chronic mesenteric ischemia, CT arteriography may be more helpful  for further evaluation. Aortic Atherosclerosis (ICD10-I70.0). Electronically Signed   By: Fidela Salisbury M.D.   On: 08/26/2021 19:28   CT HEAD WO CONTRAST (5MM)  Result Date: 08/31/2021 CLINICAL DATA:  Altered mental status. EXAM: CT HEAD WITHOUT CONTRAST TECHNIQUE: Contiguous axial images were obtained from the base of the skull through the vertex without intravenous contrast. RADIATION DOSE REDUCTION: This exam was performed according to the departmental dose-optimization program which includes automated exposure control, adjustment of the mA and/or kV according to patient size and/or use of iterative reconstruction technique. COMPARISON:  01/16/2021 FINDINGS: Brain: Stable mildly enlarged ventricles and cortical sulci. No intracranial hemorrhage, mass lesion or CT evidence of acute infarction. Vascular: No hyperdense vessel or unexpected calcification. Skull: Normal. Negative for fracture or focal lesion. Sinuses/Orbits: Stable mucosal thickening or retention cyst in the sphenoid sinus on the left, containing calcification. Mild inferior left maxillary sinus mucosal thickening. Right inferior maxillary sinus retention cyst. Status post bilateral cataract extraction. Other: None. IMPRESSION: 1. No acute abnormality. 2. Stable mild diffuse cerebral and cerebellar atrophy. 3. Chronic sinusitis. Electronically Signed   By: Claudie Revering M.D.   On: 08/31/2021 11:43   MR BRAIN WO CONTRAST  Result Date: 09/03/2021 CLINICAL DATA:  Neuro deficit with acute stroke suspected EXAM: MRI HEAD WITHOUT CONTRAST TECHNIQUE: Multiplanar, multiecho pulse sequences of the brain and surrounding structures were obtained without intravenous contrast. COMPARISON:  Head CT from 3 days ago FINDINGS: Brain: No acute infarction, hemorrhage, hydrocephalus, extra-axial collection or mass lesion. Mild for age chronic small vessel ischemia in the hemispheric white matter. Age normal brain volume. Vascular: Preserved flow voids Skull and  upper cervical spine: Unremarkable marrow signal. Sinuses/Orbits: Left sphenoid sinus opacification by inspissated material with sclerotic wall thickening, chronic. Bilateral cataract resection IMPRESSION: No acute finding, including infarct. Unremarkable appearance of the brain for age. Electronically Signed   By: Jorje Guild M.D.   On: 09/03/2021 11:09   DG CHEST PORT 1 VIEW  Result Date: 08/28/2021 CLINICAL DATA:  Shortness of breath EXAM: PORTABLE CHEST 1 VIEW COMPARISON:  08/26/2021 FINDINGS: Mild elevation of the right hemidiaphragm. Bibasilar opacities, favor atelectasis. Heart is normal size. No effusions. Aortic atherosclerosis. No acute bony abnormality. IMPRESSION: Bibasilar opacities, favor atelectasis. Electronically Signed   By: Rolm Baptise M.D.   On: 08/28/2021 21:39   DG Chest Portable 1 View  Result Date: 08/26/2021 CLINICAL DATA:  Cough EXAM: PORTABLE CHEST 1 VIEW COMPARISON:  01/16/2021 FINDINGS: The cardiomediastinal silhouette is unremarkable. Mild chronic peribronchial thickening and elevation of the RIGHT hemidiaphragm again noted. There is no evidence of focal airspace disease, pulmonary edema, suspicious pulmonary nodule/mass, pleural effusion, or pneumothorax. No acute bony abnormalities are identified. IMPRESSION:  No evidence of acute cardiopulmonary disease. Electronically Signed   By: Margarette Canada M.D.   On: 08/26/2021 19:11   EEG adult  Result Date: 08/31/2021 Greta Doom, MD     08/31/2021  8:06 PM History: 81 yo F with encephalopathy Sedation: None Technique: This EEG was acquired with electrodes placed according to the International 10-20 electrode system (including Fp1, Fp2, F3, F4, C3, C4, P3, P4, O1, O2, T3, T4, T5, T6, A1, A2, Fz, Cz, Pz). The following electrodes were missing or displaced: none. Background: The background consists of periodic high voltage triphasic morphology discharges with a shifting bifrontal predominance with a frequency of 1.5 - 2 Hz.  There is no clear evolution. Folllowing ativan administration, there was some improvement in the EEG as the patient became more drowsy, but they returned following stimulation. Photic stimulation: Physiologic driving is not performed EEG Abnormalities: 1) Generalized periodic discharges(GPDs) with triphasic morphology 2) generalized irregular slow activity. Clinical Interpretation: This markedly abnormal EEG recorded a pattern that is on the ictal-interictal continuum. It is associated with encephalopathy, but can also be associated with cortical irritability and seizure. The fluctuation with drowsiness would favor an encephalopathy etiology. Further characterization with LTM EEG is recommended. Roland Rack, MD [facility] [phone] If 7pm- 7am, please page neurology on call as listed in [place].   Overnight EEG with video  Result Date: 09/01/2021 Samuella Cota, MD     09/01/2021  8:11 AM EEG Procedure CPT/Type of Study: 28366; 24hr EEG with video Referring Provider: [facility] Primary Neurological Diagnosis: encephalopathy History: This is a 81 yr old patient, undergoing an EEG to evaluate for delirium, abnl EEG. Clinical State: obtunded Technical Description: The EEG was performed using standard setting per the guidelines of American Clinical Neurophysiology Society (ACNS). A minimum of 21 electrodes were placed on scalp according to the International 10-20 or/and 10-10 Systems. Supplemental electrodes were placed as needed. Single EKG electrode was also used to detect cardiac arrhythmia. Patient's behavior was continuously recorded on video simultaneously with EEG. A minimum of 16 channels were used for data display. Each epoch of study was reviewed manually daily and as needed using standard referential and bipolar montages. Computerized quantitative EEG analysis (such as compressed spectral array analysis, trending, automated spike & seizure detection) were used as indicated. Day 1: from  1535 08/31/21 to 0730 09/01/21 EEG Description: Overall Amplitude: High Predominant Frequency: The background activity showed theta, with about 4-5 Hz, that was frequent. Superimposed Frequencies: sparse beta activity bilaterally The background was symmetric Background Abnormalities: Generalized slowing; theta and some delta Rhythmic or periodic pattern: Generalized periodic discharges (GPD): frequent long runs of 1-2Hz  spiky GPDs, triphasic-appearing at times. These were increased with stimulation. Epileptiform activity: Yes: spiky morphology to GPD Electrographic seizures: no; no discrete seizures Events: no Breach rhythm: no Reactivity: Present Stimulation procedures: Hyperventilation: not done Photic stimulation: no change Sleep Background: Stage II EKG:no significant arrhythmia Impression: This was an abnormal continuous video EEG due to frequent runs of stimulus-induced sharp GPDs, indicative of a severe toxic/metabolic encephalopathy pattern. These discharges were not appreciably improved with treatment with benzodiazepines. This appeared more consistent with an encephalopathy pattern as opposed to a seizure pattern.    Microbiology: Results for orders placed or performed during the hospital encounter of 08/26/21  Blood culture (routine x 2)     Status: None   Collection Time: 08/26/21  8:39 PM   Specimen: Left Antecubital; Blood  Result Value Ref Range Status   Specimen Description LEFT ANTECUBITAL  Final   Special Requests   Final    BOTTLES DRAWN AEROBIC AND ANAEROBIC Blood Culture results may not be optimal due to an excessive volume of blood received in culture bottles   Culture   Final    NO GROWTH 5 DAYS Performed at [facility], [address] [place], [place] [address]    Report Status 08/31/2021 FINAL  Final  Blood culture (routine x 2)     Status: None   Collection Time: 08/26/21  8:46 PM   Specimen: BLOOD LEFT HAND  Result Value Ref Range Status   Specimen Description BLOOD LEFT  HAND  Final   Special Requests   Final    BOTTLES DRAWN AEROBIC AND ANAEROBIC Blood Culture results may not be optimal due to an excessive volume of blood received in culture bottles   Culture   Final    NO GROWTH 5 DAYS Performed at [facility], [address], [place], [place] [address]    Report Status 08/31/2021 FINAL  Final  Gram stain     Status: None   Collection Time: 08/26/21  9:12 PM   Specimen: Peritoneal Washings  Result Value Ref Range Status   Specimen Description PERITONEAL FLUID  Final   Special Requests NONE  Final   Gram Stain   Final    WBC PRESENT, PREDOMINANTLY PMN GRAM POSITIVE COCCI CYTOSPIN SMEAR Performed at [facility], [place] [address], [place], [place] [address]    Report Status 08/27/2021 FINAL  Final  Culture, body fluid w Gram Stain-bottle     Status: None   Collection Time: 08/26/21  9:12 PM   Specimen: Peritoneal Washings  Result Value Ref Range Status   Specimen Description PERITONEAL FLUID  Final   Special Requests NONE  Final   Culture   Final    NO GROWTH 5 DAYS Performed at [facility] [address], [place], [place] [address]    Report Status 09/01/2021 FINAL  Final  Resp Panel by RT-PCR (Flu A&B, Covid) Nasopharyngeal Swab     Status: None   Collection Time: 08/26/21  9:14 PM   Specimen: Nasopharyngeal Swab; Nasopharyngeal(NP) swabs in vial transport medium  Result Value Ref Range Status   SARS Coronavirus 2 by RT PCR NEGATIVE NEGATIVE Final    Comment: (NOTE) SARS-CoV-2 target nucleic acids are NOT DETECTED.  The SARS-CoV-2 RNA is generally detectable in upper respiratory specimens during the acute phase of infection. The lowest concentration of SARS-CoV-2 viral copies this assay can detect is 138 copies/mL. A negative result does not preclude SARS-Cov-2 infection and should not be used as the sole basis for treatment or other patient management decisions. A negative result may occur with  improper specimen  collection/handling, submission of specimen other than nasopharyngeal swab, presence of viral mutation(s) within the areas targeted by this assay, and inadequate number of viral copies(<138 copies/mL). A negative result must be combined with clinical observations, patient history, and epidemiological information. The expected result is Negative.  Fact Sheet for Patients:  EntrepreneurPulse.com.au  Fact Sheet for Healthcare Providers:  IncredibleEmployment.be  This test is no t yet approved or cleared by the [place] FDA and  has been authorized for detection and/or diagnosis of SARS-CoV-2 by FDA under an Emergency Use Authorization (EUA). This EUA will remain  in effect (meaning this test can be used) for the duration of the COVID-19 declaration under Section 564(b)(1) of the Act, 21 U.S.C.section 360bbb-3(b)(1), unless the authorization is terminated  or  revoked sooner.       Influenza A by PCR NEGATIVE NEGATIVE Final   Influenza B by PCR NEGATIVE NEGATIVE Final    Comment: (NOTE) The Xpert Xpress SARS-CoV-2/FLU/RSV plus assay is intended as an aid in the diagnosis of influenza from Nasopharyngeal swab specimens and should not be used as a sole basis for treatment. Nasal washings and aspirates are unacceptable for Xpert Xpress SARS-CoV-2/FLU/RSV testing.  Fact Sheet for Patients: EntrepreneurPulse.com.au  Fact Sheet for Healthcare Providers: IncredibleEmployment.be  This test is not yet approved or cleared by the [place] FDA and has been authorized for detection and/or diagnosis of SARS-CoV-2 by FDA under an Emergency Use Authorization (EUA). This EUA will remain in effect (meaning this test can be used) for the duration of the COVID-19 declaration under Section 564(b)(1) of the Act, 21 U.S.C. section 360bbb-3(b)(1), unless the authorization is terminated or revoked.  Performed at [facility], [address] [place], [place] [address]   MRSA Next Gen by PCR, Nasal     Status: None   Collection Time: 08/27/21 10:37 AM   Specimen: Nasal Mucosa; Nasal Swab  Result Value Ref Range Status   MRSA by PCR Next Gen NOT DETECTED NOT DETECTED Final    Comment: (NOTE) The GeneXpert MRSA Assay (FDA approved for NASAL specimens only), is one component of a comprehensive MRSA colonization surveillance program. It is not intended to diagnose MRSA infection nor to guide or monitor treatment for MRSA infections. Test performance is not FDA approved in patients less than 81 years old. Performed at [facility], [place] [address] [place], [place] [address]     Labs: CBC: Recent Labs  Lab 09/01/21 0326 09/04/21 0413  WBC 9.6 10.0  HGB 8.8* 8.9*  HCT 25.0* 27.6*  MCV 102.9* 109.1*  PLT 296 893   Basic Metabolic Panel: Recent Labs  Lab 09/01/21 0326 09/02/21 0311 09/03/21 0346 09/04/21 0413  NA 137 138 138 140  K 3.3* 3.7 3.4* 3.5  CL 98 98 97* 99  CO2 24 24 22 25   GLUCOSE 130* 136* 138* 116*  BUN 60* 62* 61* 60*  CREATININE 10.17* 10.82* 11.10* 11.46*  CALCIUM 9.3 9.5 9.6 9.6  MG  --   --  2.4  --   PHOS 6.2* 6.3* 7.1* 6.6*   Liver Function Tests: Recent Labs  Lab 09/01/21 0326 09/02/21 0311 09/03/21 0346 09/04/21 0413  ALBUMIN 2.1* 2.2* 2.4* 2.4*   CBG: No results for input(s): GLUCAP in the last 168 hours.  Discharge time spent: greater than 30 minutes.  Signed: Mercy Riding, MD [facility] 09/07/2021

## 2021-09-07 NOTE — Progress Notes (Addendum)
Inpatient Rehabilitation Admissions Coordinator   I have insurance approval and CIR bed to admit her to today. I met at bedside with patient and spoke with her daughter, [name], by phone. I will alert acute team and TOC to make the arrangements to admit today. Freda Munro in Hemodialysis made aware of admit.  Danne Baxter, RN, MSN Rehab Admissions Coordinator [phone] 09/07/2021 12:13 PM

## 2021-09-07 NOTE — Progress Notes (Signed)
[facility] NEPHROLOGY PROGRESS NOTE  Assessment/ Plan: Pt is a 81 y.o. 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   CCPD, coordinated through [facility] (Dr. Holley Raring) [phone]  57 kg EDW Last ESA was dosed at home per the HD unit. Mircera is ordered for 50 mcg on 08/10/21. May be able to give again here soon.  [phone] phone number for outpatient PD RN line  # PD associated peritonitis: Culture-negative peritonitis.  Gram stain with gram-positive cocci.  Reportedly the PD fluid was cloudy and the patient received antibiotics before the sample was collected.  Initially the antibiotics started on 2/4 vancomycin and cefepime which was later changed to Ancef because of concern of encephalopathy.  Seen by neurology and there is no improvement in mental status.   The antibiotics changed to meropenem on 2/13 in order to avoid cephalosporins. The plan is to complete total 2 weeks of antibiotics with vancomycin 1 g MWF and meropenem. Last dose on 2/18.  #ESRD on PD: Continue CCPD tonight, all 5 exchanges 2.5%.  Monitor lab.  Discussed with the dialysis nurse.  #Acute metabolic encephalopathy: Concern for cephalosporin related neurotoxicity.  Seen by neurology.   MRI of the brain with no acute finding.  Avoiding cephalosporins and now changed to meropenem.   Mental status is gradually improving.  PT OT eval and now  waiting to go to inpatient rehab.  # Anemia: On Mircera as outpatient .  We will continue weekly Aranesp.  Monitor hemoglobin.  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 Monitor lab.  # HTN/volume: BP variable.  On carvedilol.  Volume management by PD.  Subjective: The patient was seen and examined.  She was sleeping comfortable this morning.  No new event.  No complaint.  Objective Vital signs in last 24 hours: Vitals:   09/06/21 1135 09/06/21 2128 09/07/21 0416 09/07/21 0730  BP:  118/67 (!) 153/67 (!) 121/59   Pulse: 78 88 83   Resp: 18 18 18 18   Temp: 97.7 F (36.5 C) 98.2 F (36.8 C) 98.4 F (36.9 C) 98 F (36.7 C)  TempSrc: Oral Oral Oral Oral  SpO2: 100% 98% 96%   Weight:   55.1 kg   Height:       Weight change: 2.567 kg  Intake/Output Summary (Last 24 hours) at 09/07/2021 0840 Last data filed at 09/06/2021 1944 Gross per 24 hour  Intake 11612 ml  Output 9014 ml  Net 2598 ml        Labs: Basic Metabolic Panel: Recent Labs  Lab 09/02/21 0311 09/03/21 0346 09/04/21 0413  NA 138 138 140  K 3.7 3.4* 3.5  CL 98 97* 99  CO2 24 22 25   GLUCOSE 136* 138* 116*  BUN 62* 61* 60*  CREATININE 10.82* 11.10* 11.46*  CALCIUM 9.5 9.6 9.6  PHOS 6.3* 7.1* 6.6*    Liver Function Tests: Recent Labs  Lab 09/02/21 0311 09/03/21 0346 09/04/21 0413  ALBUMIN 2.2* 2.4* 2.4*    No results for input(s): LIPASE, AMYLASE in the last 168 hours. Recent Labs  Lab 08/31/21 1000  AMMONIA 32    CBC: Recent Labs  Lab 09/01/21 0326 09/04/21 0413  WBC 9.6 10.0  HGB 8.8* 8.9*  HCT 25.0* 27.6*  MCV 102.9* 109.1*  PLT 296 325    Cardiac Enzymes: No results for input(s): CKTOTAL, CKMB, CKMBINDEX, TROPONINI in the last 168 hours. CBG: No results for input(s):  GLUCAP in the last 168 hours.  Iron Studies: No results for input(s): IRON, TIBC, TRANSFERRIN, FERRITIN in the last 72 hours. Studies/Results: No results found.  Medications: Infusions:  sodium chloride Stopped (09/01/21 1220)   dialysis solution 2.5% low-MG/low-CA     meropenem (MERREM) IV 500 mg (09/06/21 0845)    Scheduled Medications:  (feeding supplement) PROSource Plus  30 mL Oral BID BM   carvedilol  12.5 mg Oral BID WC   Chlorhexidine Gluconate Cloth  6 each Topical Daily   darbepoetin (ARANESP) injection - NON-DIALYSIS  100 mcg Subcutaneous Q Wed-1800   famotidine  20 mg Oral Daily   feeding supplement  237 mL Oral BID BM   gentamicin cream  1 application Topical Daily    heparin  5,000 Units Subcutaneous Q8H   multivitamin  1 tablet Oral QHS   pantoprazole  40 mg Oral Daily   sevelamer carbonate  2.4 g Oral TID WC   vancomycin variable dose per unstable renal function (pharmacist dosing)   Does not apply See admin instructions    have reviewed scheduled and prn medications.  Physical Exam: General: able to lie flat comfortable.  Not in distress Heart:RRR, s1s2 nl Lungs:clear b/l, no crackle Abdomen:soft, PD catheter in place Extremities:No leg edema Dialysis Access: PD catheter  Shirel Mallis Tanna Furry 09/07/2021,8:40 AM  LOS: 12 days

## 2021-09-07 NOTE — H&P (Addendum)
Physical Medicine and Rehabilitation Admission H&P        Chief Complaint  Patient presents with   Functional deficits due to encephalopathy/debility.       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 She was found to be septic due to peritonitis with lactic acidosis, milky fluid with 8667 WBC and mesothelial cells. She was started on Vanc/Cefepime for treatment of PD associated peritonitis.   BC/peritoneal cultures negative.  Hospital course significant for encephalopathy with lethargy with non verbal state, myoclonus, confusion and hallucinations that started on 02/09. Cefepime changed to ancef without improvement. Dr. Leonel Ramsay consulted for input. MRI brain negative. EEG negative for seizures and showed frequent runs of induced sharp GPDs c/w severe toxic/metabolic encephalopathy.    Confusion gradually resolved with improvement in alertness and verbal output back to baseline. Continues on Meropenum and Vanc to complete 2 week course of antibiotic regimen. Tremors/jerks with activity improving but she continue to be limited by weakness with unsteady gait, fatigue and STM deficits with poor safety awareness. CIR recommended due to functional decline.    Pt reports no myoclonus activity seen- nurse agrees for today.  Denies pain currently, but had this AM when was walking- chronic LBP. Tylenol doesn't help and so avoids meds.  LBM this AM- loose stools- actually asked for imodium while I was there.  Almost anuric per pt- rare formation of urine.        Review of Systems  Constitutional:  Negative for malaise/fatigue.       Has poor appetite--drinks 1/2 container of ensure twice a day at home.   HENT:  Negative for hearing loss and tinnitus.   Respiratory:  Negative for cough  and shortness of breath.   Cardiovascular:  Negative for chest pain.  Gastrointestinal:  Positive for diarrhea (loose stools 3-4 times a day).  Genitourinary:  Negative for dysuria.       Voids small amount daily  Musculoskeletal:  Positive for back pain and myalgias.  Neurological:  Positive for sensory change (numbness bilateral feet) and weakness.  Psychiatric/Behavioral:  The patient is not nervous/anxious.   All other systems reviewed and are negative.         Past Medical History:  Diagnosis Date   Anemia in stage 4 chronic kidney disease ([place]) 08/18/2018   Depression     Essential hypertension, benign     GERD (gastroesophageal reflux disease)     Glomerulonephritis     Gout     Mixed hyperlipidemia     Renal insufficiency             Past Surgical History:  Procedure Laterality Date   APPENDECTOMY       CATARACT EXTRACTION       COLONOSCOPY N/A 12/28/2015    Procedure: COLONOSCOPY;  Surgeon: Rogene Houston, MD;  [facility];  Service: Endoscopy;  Laterality: N/A;  815   ESOPHAGOGASTRODUODENOSCOPY N/A 11/16/2020    Procedure: ESOPHAGOGASTRODUODENOSCOPY (EGD);  Surgeon: Rogene Houston, MD;  Location: [facility];  Service: Endoscopy;  Laterality: N/A;  1:15   IR FLUORO GUIDE CV LINE RIGHT   01/16/2019   IR REMOVAL TUN CV CATH W/O FL   07/01/2019   IR US GUIDE VASC ACCESS RIGHT   01/16/2019  POLYPECTOMY   11/16/2020    Procedure: POLYPECTOMY;  Surgeon: Rogene Houston, MD;  Location: [facility];  Service: Endoscopy;;  gastric   TUBAL LIGATION               Family History  Problem Relation Age of Onset   CAD Father     Heart attack Father     Diabetes Mellitus II Father     Hypertension Father     Lupus Brother        Social History: Granddaughter (32 years old) lives with her. Retired--used to work at [employer]. She reports that she has never smoked. She has never used smokeless tobacco. She reports that she does not drink alcohol and does not  use drugs.          Allergies  Allergen Reactions   Cefepime Other (See Comments)      Feb 2023 Encephalopathy with questionable seizures. Seems to be tolerating cefazolin    Sulfa Antibiotics Other (See Comments)      Shut pt's kidneys down            Medications Prior to Admission  Medication Sig Dispense Refill   carvedilol (COREG) 12.5 MG tablet Take 12.5 mg by mouth daily.       losartan (COZAAR) 50 MG tablet Take 50 mg by mouth at bedtime.       multivitamin (RENA-VIT) TABS tablet Take 1 tablet by mouth at bedtime. 30 tablet 0   calcium carbonate (TUMS - DOSED IN MG ELEMENTAL CALCIUM) 500 MG chewable tablet Chew 1 tablet (200 mg of elemental calcium total) by mouth 2 (two) times daily with a meal. (Patient not taking: Reported on 08/27/2021) 60 tablet 0   famotidine (PEPCID) 20 MG tablet Take 1 tablet (20 mg total) by mouth at bedtime. (Patient not taking: Reported on 08/27/2021)       lansoprazole (PREVACID) 30 MG capsule Take 1 capsule (30 mg total) by mouth daily before breakfast. (Patient not taking: Reported on 08/27/2021) 30 capsule 5          Home: [place] expects to be discharged to:: Private residence Living Arrangements:  [name], daughter to assist) Available Help at Discharge: Family, Available 24 hours/day Type of Home: House Home Access: Stairs to enter [employer] of Steps: 1 Entrance Stairs-Rails: None Home Layout: Able to live on main level with bedroom/bathroom, Laundry or work area in basement [employer] Shower/Tub: Chiropodist: Standard Bathroom Accessibility: Yes (daughter/pt think a walker should be able to fit into the bathroom) Home Equipment: [place] - single point, Conservation officer, nature (2 wheels), Wheelchair - manual  Lives With: Family   Functional History: Prior Function Prior Level of Function : Independent/Modified Independent, Driving   Functional Status:  Mobility: Bed Mobility Overal bed mobility:   (presented sitting in chair) Bed Mobility: Supine to Sit Rolling: Max assist Supine to sit: Supervision, HOB elevated (head at 90 degrees) Sit to supine: Mod assist General bed mobility comments: pt sitting EOB upon my arrival Transfers Overall transfer level: Needs assistance Equipment used: Rolling walker (2 wheels) Transfers: Sit to/from Stand Sit to Stand: Min guard General transfer comment: progressed from minA to minG with continued reps. pt completing x5 in 24 seconds, x6 in 30 seconds Ambulation/Gait Ambulation/Gait assistance: Min assist, Mod assist Gait Distance (Feet): 30 Feet (+15 ft) Assistive device: Rolling walker (2 wheels), 1 person hand held assist Gait Pattern/deviations: Step-to pattern, Decreased stride length, Decreased dorsiflexion - right, Decreased  dorsiflexion - left, Shuffle, Narrow base of support, Trunk flexed General Gait Details: pt with small strides and trunk flexed, cues for posture. trial without DME and pt needing modA through HHA as well as reaching for furniture in room to steady Gait velocity: decreased Gait velocity interpretation: <1.31 ft/sec, indicative of household ambulator   ADL: ADL Overall ADL's : Needs assistance/impaired Eating/Feeding: Set up, Sitting Eating/Feeding Details (indicate cue type and reason): Pt noted to start to gag/cough with oral movements at this time but did not have anything in mouth. SLP came into room and described what was occuring at this time Grooming: Wash/dry hands, Min guard, Cueing for safety, Cueing for sequencing, Standing Upper Body Bathing: Set up, Sitting Lower Body Bathing: Minimal assistance, Cueing for sequencing, Cueing for safety, Sit to/from stand Upper Body Dressing : Set up, Sitting Lower Body Dressing: Minimal assistance, Cueing for safety, Cueing for sequencing, Sit to/from stand Toilet Transfer: Min guard, Cueing for safety, Cueing for sequencing, Rolling walker (2 wheels) Toileting-  Clothing Manipulation and Hygiene: Min guard, Cueing for safety, Cueing for sequencing, Sit to/from stand Toileting - Clothing Manipulation Details (indicate cue type and reason): Pt was able to start post set up but fatigued in standing and requires assist to have good quality of hygiene Functional mobility during ADLs: Min guard, Rolling walker (2 wheels) General ADL Comments: Pt reporting they feel like they have sea legs with prolonged standing   Cognition: Cognition Overall Cognitive Status: Impaired/Different from baseline Orientation Level: Oriented X4 Cognition Arousal/Alertness: Awake/alert Behavior During Therapy: WFL for tasks assessed/performed Overall Cognitive Status: Impaired/Different from baseline Area of Impairment: Safety/judgement, Problem solving Orientation Level: Disoriented to, Place, Time, Situation Current Attention Level: Focused Memory: Decreased recall of precautions, Decreased short-term memory Following Commands: Follows multi-step commands consistently Safety/Judgement: Decreased awareness of safety Awareness: Intellectual Problem Solving: Slow processing, Requires verbal cues, Requires tactile cues, Difficulty sequencing General Comments: Pt reported they were attempting to get by themself but unclear if accurate or not as the daughter from prior visit reported in the evenings noted thy were sundowning some in evening hours Difficult to assess due to: Impaired communication     Blood pressure (!) 121/59, pulse 83, temperature 98 F (36.7 C), temperature source Oral, resp. rate 18, height 4\' 11"  (1.499 m), weight 55.1 kg, SpO2 96 %. Physical Exam Vitals and nursing note reviewed. Exam conducted with a chaperone present.  Constitutional:      Appearance: She is well-developed.     Comments: Elderly pt who appears younger than stated age sitting up in bedside chair; nurse in room; NAD  HENT:     Head: Normocephalic and atraumatic.     Comments: Smile  equal    Right Ear: External ear normal.     Left Ear: External ear normal.     Nose: Nose normal. No congestion.     Mouth/Throat:     Mouth: Mucous membranes are dry.     Pharynx: Oropharynx is clear. No oropharyngeal exudate.  Eyes:     General:        Right eye: No discharge.        Left eye: No discharge.     Extraocular Movements: Extraocular movements intact.  Cardiovascular:     Rate and Rhythm: Normal rate and regular rhythm.     Heart sounds: Normal heart sounds. No murmur heard.   No gallop.  Pulmonary:     Effort: Pulmonary effort is normal. No respiratory distress.  Breath sounds: Normal breath sounds. No wheezing or rales.  Abdominal:     General: There is distension. There is no abdominal bruit.     Palpations: Abdomen is soft.     Comments: Hyperactive BS- slightly distended/soft  Musculoskeletal:     Cervical back: Neck supple. No tenderness.     Comments: 5-/5 in UE B/L  LE's- 5-/5 except 4+/5 in DF and PF B/L Limited due to pt's delayed responses  Skin:    Comments: PD catheter in abd L forearm IV- looks OK   Neurological:     Mental Status: She is alert and oriented to person, place, and time.     Comments: Delayed responses- Ox3, however very vague and tries to answer questions slowly No myoclonic activity seen  Psychiatric:     Comments: flat      Lab Results Last 48 Hours        Results for orders placed or performed during the hospital encounter of 08/26/21 (from the past 48 hour(s))  Vancomycin, random     Status: None    Collection Time: 09/06/21  3:52 AM  Result Value Ref Range    Vancomycin Rm 26        Comment:        Random Vancomycin therapeutic range is dependent on dosage and time of specimen collection. A peak range is 20.0-40.0 ug/mL A trough range is 5.0-15.0 ug/mL        Performed at [facility] [address], [place], [place] [address]        Imaging Results (Last 48 hours)  No results found.          Blood pressure (!) 121/59, pulse 83, temperature 98 F (36.7 C), temperature source Oral, resp. rate 18, height 4\' 11"  (1.499 m), weight 55.1 kg, SpO2 96 %.   Medical Problem List and Plan: 1. Functional deficits secondary to Toxic metabolic encephalopathy             -patient may not shower- due to PD catheter             -ELOS/Goals: 10-14 days supervision 2.  Antithrombotics: -DVT/anticoagulation:  Pharmaceutical: Heparin             -antiplatelet therapy: n/a 3. Pain Management:  N/a 4. Mood: LCSW to follow for evaluation and support.              -antipsychotic agents: N/A 5. Neuropsych: This patient may be intermittently capable of making decisions on her own behalf. 6. Skin/Wound Care: Routine pressure relief measure             --daily care to PD site 7 . Fluids/Electrolytes/Nutrition:  Monitor I/O. Check CMET in am 8. Peritonitis/sepsis: Vancomycin 1 gram MWF with meropenum to continue thorough 02/17 9. Cefepime induced neurotoxicity/encephalopathy: Has cleared but still not back to baseline per daughter.  10. Diarrhea: Related to antibiotics and/or ensure.  -continue prosource BID.  11. ESRD: Continue PD at the end of the day to help with tolerance of activity 12. Anemia of chronic disease: On aranesp weekly.              --continue to monitor H/H with routine checks.  13. Gastroparesis/GERD?: Continue pepcid BID. Continue lomotil bid prn.  --Discontinue protonix due to question SE of diarrhea.  --Was managed without meds at home.  14. HTN: Monitor BP TID--continue coreg bid.  15. Poor appetite- is acute on chronic- might need appetite stimulant  I have personally performed a face to face diagnostic evaluation of this patient and formulated the key components of the plan.  Additionally, I have personally reviewed laboratory data, imaging studies, as well as relevant notes and concur with the physician assistant's documentation above.   The patient's status has not  changed from the original H&P.  Any changes in documentation from the acute care chart have been noted above.               Bary Leriche, PA-C 09/07/2021

## 2021-09-07 NOTE — TOC Transition Note (Signed)
Transition of Care [facility]) - CM/SW Discharge Note   Patient Details  Name: Felicia Williams MRN: [id number] Date of Birth: [birth year]  [facility] CM/SW Contact:  Zenon Mayo, RN Phone Number: 09/07/2021, 12:35 PM   Clinical Narrative:    Patient for DC to CIR today.   Final next level of care: IP Rehab Facility Barriers to Discharge: No Barriers Identified   Patient Goals and CMS Choice Patient states their goals for this hospitalization and ongoing recovery are:: IP rehab CMS Medicare.gov Compare Post Acute Care list provided to:: Patient Represenative (must comment) Choice offered to / list presented to : Adult Children  Discharge Placement                       Discharge Plan and Services   Discharge Planning Services: CM Consult Post Acute Care Choice: Home Health          DME Arranged: Bedside commode DME Agency: [employer] Date DME Agency Contacted: 08/31/21 Time DME Agency Contacted: 28 Representative spoke with at DME Agency: [place]: RN, PT, Disease Management [place] Agency:  ([place]) Date [place]: 08/31/21 Time [place]: [id number] Representative spoke with at [place]: [place] ([place]) Interventions     Readmission Risk Interventions Readmission Risk Prevention Plan 01/20/2019  Transportation Screening Complete  PCP or Specialist Appt within 3-5 Days Complete  HRI or [place] Complete  Social Work Consult for [place] Planning/Counseling Complete  Palliative Care Screening Not Applicable  Medication Review Press photographer) Complete  Some recent data might be hidden

## 2021-09-07 NOTE — Progress Notes (Signed)
Occupational Therapy Treatment Patient Details Name: Felicia Williams MRN: [id number] DOB: [birth year] Today's Date: 09/07/2021   History of present illness Pt is a 81 y.o. F who presents 08/26/2021 with severe abdominal pain, nausea, dry heaving. Found to have leukocytosis and lactic acid elevation. CT abd/pelvis revealed no acute intraabdominal findings. IV antibiotics started for suspicion of bacterial peritonitis. Significant PMH: ESRD on PD, HTN, HLD, spinal stenosis.   OT comments  Pt has made significant improvements as noted increase in ability to recall this therapist working with tem in prior session however demonstrates decrease in safety/judgement as reported they were attempting to ambulate without assistance. Pt was able to complete ADLS post set up with ability to coordinate tool use with no assist. Pt was able to complete transfers with min guard and functional ambulation in room with min guard but required cues on safety and coordination. Pt currently with functional limitations due to the deficits listed below (see OT Problem List).  Pt will benefit from skilled OT to increase their safety and independence with ADL and functional mobility for ADL to facilitate discharge to venue listed below.     Recommendations for follow up therapy are one component of a multi-disciplinary discharge planning process, led by the attending physician.  Recommendations may be updated based on patient status, additional functional criteria and insurance authorization.    Follow Up Recommendations  Acute inpatient rehab (3hours/day)    Assistance Recommended at Discharge Frequent or constant Supervision/Assistance  Patient can return home with the following  A little help with bathing/dressing/bathroom;Assistance with cooking/housework   Equipment Recommendations  BSC/3in1    Recommendations for Other Services      Precautions / Restrictions Precautions Precautions: Fall Precaution Comments: no  more tremors noted in this session Restrictions Weight Bearing Restrictions: No       Mobility Bed Mobility Overal bed mobility:  (presented sitting in chair)                  Transfers Overall transfer level: Needs assistance Equipment used: Rolling walker (2 wheels) Transfers: Sit to/from Stand Sit to Stand: Min guard                 Balance Overall balance assessment: Needs assistance Sitting-balance support: Feet supported Sitting balance-Leahy Scale: Good     Standing balance support: Bilateral upper extremity supported Standing balance-Leahy Scale: Fair Standing balance comment: Pt needs UE with mobility but able to complete static standing with no BUE support                           ADL either performed or assessed with clinical judgement   ADL Overall ADL's : Needs assistance/impaired Eating/Feeding: Set up;Sitting   Grooming: Wash/dry hands;Min guard;Cueing for safety;Cueing for sequencing;Standing   Upper Body Bathing: Set up;Sitting   Lower Body Bathing: Minimal assistance;Cueing for sequencing;Cueing for safety;Sit to/from stand   Upper Body Dressing : Set up;Sitting   Lower Body Dressing: Minimal assistance;Cueing for safety;Cueing for sequencing;Sit to/from stand   Toilet Transfer: Min guard;Cueing for safety;Cueing for sequencing;Rolling walker (2 wheels)   Toileting- Water quality scientist and Hygiene: Min guard;Cueing for safety;Cueing for sequencing;Sit to/from stand       Functional mobility during ADLs: Min guard;Rolling walker (2 wheels)      Extremity/Trunk Assessment Upper Extremity Assessment Upper Extremity Assessment: RUE deficits/detail RUE Deficits / Details: Pt reported sometimes they hace difficulties with ROM in UE but today WFL and no tremors noted  Lower Extremity Assessment Lower Extremity Assessment: Defer to PT evaluation        Vision       Perception     Praxis      Cognition  Arousal/Alertness: Awake/alert Behavior During Therapy: WFL for tasks assessed/performed Overall Cognitive Status: Impaired/Different from baseline Area of Impairment: Safety/judgement, Problem solving                       Following Commands: Follows multi-step commands consistently       General Comments: Pt reported they were attempting to get by themself but unclear if accurate or not as the daughter from prior visit reported in the evenings noted thy were sundowning some in evening hours        Exercises      Shoulder Instructions       General Comments      Pertinent Vitals/ Pain       Pain Assessment Pain Assessment: No/denies pain  Home Living                                          Prior Functioning/Environment              Frequency  Min 2X/week        Progress Toward Goals  OT Goals(current goals can now be found in the care plan section)  Progress towards OT goals: Progressing toward goals  Acute Rehab OT Goals Patient Stated Goal: to get stronger OT Goal Formulation: With patient Time For Goal Achievement: 09/19/21 Potential to Achieve Goals: Good ADL Goals Pt Will Perform Grooming: with modified independence;sitting Pt Will Perform Upper Body Bathing: with set-up;sitting Pt Will Perform Lower Body Bathing: with supervision;sit to/from stand Additional ADL Goal #1: Pt will be able to stand for 10 mins for ADLS  Plan Discharge plan remains appropriate    Co-evaluation                 AM-PAC OT "6 Clicks" Daily Activity     Outcome Measure   Help from another person eating meals?: A Little Help from another person taking care of personal grooming?: A Little Help from another person toileting, which includes using toliet, bedpan, or urinal?: A Little Help from another person bathing (including washing, rinsing, drying)?: A Lot Help from another person to put on and taking off regular upper body  clothing?: A Little Help from another person to put on and taking off regular lower body clothing?: A Little 6 Click Score: 17    End of Session Equipment Utilized During Treatment: Gait belt;Rolling walker (2 wheels)  OT Visit Diagnosis: Unsteadiness on feet (R26.81);Other abnormalities of gait and mobility (R26.89);Muscle weakness (generalized) (M62.81)   Activity Tolerance Patient tolerated treatment well   Patient Left in chair;with call bell/phone within reach;with chair alarm set   Nurse Communication Mobility status        Time: 6195-0932 OT Time Calculation (min): 21 min  Charges: OT General Charges $OT Visit: 1 Visit OT Treatments $Self Care/Home Management : 8-22 mins  Joeseph Amor OTR/L  [place]  [phone] office number [phone] pager number   Joeseph Amor 09/07/2021, 1:45 PM

## 2021-09-07 NOTE — Assessment & Plan Note (Signed)
Continue PT/OT at [facility].

## 2021-09-07 NOTE — Progress Notes (Signed)
Mobility Specialist Progress Note:   09/07/21 1045  Mobility  Activity Ambulated with assistance in hallway  Level of Assistance Minimal assist, patient does 75% or more  Assistive Device None  Distance Ambulated (ft) 50 ft  Activity Response Tolerated fair  $Mobility charge 1 Mobility   Pt eager for OOB mobility this am. Ambulated with no AD, as this is pt baseline. Pt requiring minA at times for steadying, as well as pt reaching for hallway rails throughout. Pt displaying flexed posture during session, unable to correct. Pt with limited endurance at this date, left in chair with chair alarm on.   Nelta Numbers [facility] Phone: [phone] Office Phone: [phone]

## 2021-09-07 NOTE — Progress Notes (Signed)
Handoff report received from [facility] on [place] for this patient admitting to [facility] inpatient rehab.

## 2021-09-07 NOTE — Plan of Care (Signed)
°  Problem: Consults Goal: RH GENERAL PATIENT EDUCATION Description: See Patient Education module for education specifics. Outcome: Progressing   Problem: RH BOWEL ELIMINATION Goal: RH STG MANAGE BOWEL WITH ASSISTANCE Description: STG Manage Bowel with mod I Assistance. Outcome: Progressing   Problem: RH SAFETY Goal: RH STG ADHERE TO SAFETY PRECAUTIONS W/ASSISTANCE/DEVICE Description: STG Adhere to Safety Precautions With cues Assistance/Device. Outcome: Progressing   Problem: RH KNOWLEDGE DEFICIT GENERAL Goal: RH STG INCREASE KNOWLEDGE OF SELF CARE AFTER HOSPITALIZATION Description:  Patient and son will be able to manage care at discharge using handouts and educational resources independently Outcome: Progressing   Problem: Fluid Volume: Goal: Compliance with measures to maintain balanced fluid volume will improve Description: Patient and son will be able to manage care at discharge using handouts and educational resources independently Outcome: Progressing

## 2021-09-07 NOTE — Progress Notes (Signed)
Courtney Heys, MD  Physician Physical Medicine and Rehabilitation PMR Pre-admission    Signed Date of Service:  09/03/2021  5:06 PM  Related encounter: ED to Hosp-Admission (Current) from 08/26/2021 in [place] [facility]   Signed      Show:Clear all '[x]' Written'[x]' Templated'[]' Copied  Added by: '[x]' Haru Shaff, Vertis Kelch, RN'[x]' Lind Covert, Lauren Mamie Nick, CCC-SLP'[x]' Courtney Heys, MD  '[]' Hover for details                                                                                                                                                                                                                                                                                                                                                                                                                      PMR Admission Coordinator Pre-Admission Assessment   Patient: Felicia Williams is an 81 y.o., female MRN: [id number] DOB: 1941-05-01 Height: '4\' 11"'  (149.9 cm) Weight: 55.1 kg   Insurance Information HMO:     PPO:      PCP:      IPA:      80/20:      OTHER:  PRIMARY: U[employer] Medicare      Policy#: [id number]      Subscriber: patient CM Name: Wilburn Cornelia      Phone#: [phone] option #7     Fax#: [phone] Pre-Cert#: [id number]   approved for 7 days   Employer:  Benefits:  Phone #: [phone]     Name: 2/14 Eff. Date: 07/23/21     Deduct:  none      Out of Pocket Max: $3600      Life Max: none CIR: $295 co pay per day days 1 until 5      SNF: no copay days 1 until 20; $196 co pay per day days 21 until 39; no c co pay days 40 until 100 Outpatient: $20 per visit     Co-Pay: visits per medical neccesity Home Health: 100%      Co-Pay: visits per medical neccesity DME: 80%     Co-Pay: 20% Providers: in-network SECONDARY:       Policy#:      Phone#:    Publishing copy:       Phone#:    The [employer] for patients in Inpatient Rehabilitation Facilities with attached Privacy Act [place] Records was provided and verbally reviewed with: Family   Emergency Contact Information Contact Information       Name Relation Home Work [place] Son [phone]   [phone]    Betti Cruz Daughter     [phone]         Current Medical History  Patient Admitting Diagnosis: sepsis d/t peritonitis   History of Present Illness:  81 year old female with medical hx significant for: ESRD on PD, HTN, HLD, spinal stenosis, GERD. Pt presented to [facility] on 08/26/21 due to complaints of stomach tenderness with associated nausea and vomiting. CBC showed leukocytosis and elevated lactic acid. Chest x-ray unremarkable. CT abdomen  negative for acute findings. Pt transferred to [facility]. Peritoneal fluid appeared milky with neutrophil count 8,667 and GPC on gram stain. Pt treated for suspicion of bacterial peritonitis. Peritoneal fluid cultures negative. Acute metabolic encephalopathy noted 08/30/21. Suspected d/t cefepime and peritonitis; cefepime discontinued. Neurology consulted d/t AMS. CT head negative for acute abnormalities. EEG on 08/31/21 was abnormal; associated with encephalopathy and/or seizure. LTM EEG on 09/01/21 was consistent with toxic/metabolic encephalopathy rather than seizure. MRI showed no acute findings.    Patient's medical record from [facility] has been reviewed by the rehabilitation admission coordinator and physician.   Past Medical History      Past Medical History:  Diagnosis Date   Anemia in stage 4 chronic kidney disease ([place]) 08/18/2018   Depression     Essential hypertension, benign     GERD (gastroesophageal reflux disease)     Glomerulonephritis     Gout     Mixed hyperlipidemia     Renal insufficiency      Has the patient had major surgery during 100  days prior to admission? No   Family History   family history includes CAD in her father; Diabetes Mellitus II in her father; Heart attack in her father; Hypertension in her father; Lupus in her brother.   Current Medications   Current Facility-Administered Medications:    (feeding supplement) PROSource Plus liquid 30 mL, 30 mL, Oral, BID BM, Stovall, Kathryn R, PA-C, 30 mL at 09/07/21 0948   0.9 %  sodium chloride infusion, , Intravenous, PRN, [name], MD, Stopped at 09/01/21 1220   acetaminophen (TYLENOL) tablet 650 mg, 650 mg, Oral, Q6H PRN **OR** acetaminophen (TYLENOL) suppository 650 mg, 650 mg, Rectal, Q6H PRN, Zierle-Ghosh, Asia B, DO, 650 mg at 09/02/21 5573   camphor-menthol (SARNA) lotion, , Topical, PRN, [name], MD   carvedilol (COREG) tablet 12.5 mg, 12.5 mg, Oral, BID WC, Hongalgi, Anand D, MD, 12.5 mg at 09/07/21 0948   Chlorhexidine  Gluconate Cloth 2 % PADS 6 each, 6 each, Topical, Daily, Hongalgi, Anand D, MD, 6 each at 09/06/21 0837   [START ON 09/08/2021] Darbepoetin Alfa (ARANESP) injection 100 mcg, 100 mcg, Subcutaneous, Q Thu-1800, Einar Grad, RPH   dialysis solution 2.5% low-MG/low-CA dianeal solution, , Intraperitoneal, Q24H, Donnamae Jude, RPH, 5,000 mL at 09/06/21 2029   famotidine (PEPCID) tablet 20 mg, 20 mg, Oral, Daily, Alvira Philips, [place], 20 mg at 09/07/21 2423   feeding supplement (ENSURE ENLIVE / ENSURE PLUS) liquid 237 mL, 237 mL, Oral, BID BM, [name], MD, 237 mL at 09/04/21 1056   gentamicin cream (GARAMYCIN) 0.1 % 1 application, 1 application, Topical, Daily, Claudia Desanctis, MD, 1 application at 53/61/44 1147   heparin injection 5,000 Units, 5,000 Units, Subcutaneous, Q8H, Zierle-Ghosh, Asia B, DO, 5,000 Units at 09/07/21 0545   labetalol (NORMODYNE) injection 10 mg, 10 mg, Intravenous, Q6H PRN, Opyd, Ilene Qua, MD, 10 mg at 09/02/21 1607   loperamide (IMODIUM) capsule 2 mg, 2 mg, Oral, PRN, Wendee Beavers T, MD, 2 mg at 09/07/21  0142   meropenem (MERREM) 500 mg in sodium chloride 0.9 % 100 mL IVPB, 500 mg, Intravenous, Q24H, Hongalgi, Anand D, MD, Last Rate: 200 mL/hr at 09/07/21 1002, 500 mg at 09/07/21 1002   multivitamin (RENA-VIT) tablet 1 tablet, 1 tablet, Oral, QHS, [name], MD, 1 tablet at 09/06/21 2143   ondansetron (ZOFRAN) tablet 4 mg, 4 mg, Oral, Q6H PRN **OR** ondansetron (ZOFRAN) injection 4 mg, 4 mg, Intravenous, Q6H PRN, Zierle-Ghosh, Asia B, DO, 4 mg at 09/03/21 1018   pantoprazole (PROTONIX) EC tablet 40 mg, 40 mg, Oral, Daily, Alvira Philips, RPH, 40 mg at 09/07/21 3154   polyethylene glycol (MIRALAX / GLYCOLAX) packet 17 g, 17 g, Oral, Daily PRN, Claudia Desanctis, MD   sevelamer carbonate (RENVELA) powder PACK 2.4 g, 2.4 g, Oral, TID WC, Stovall, Kathryn R, PA-C, 2.4 g at 09/07/21 1148   vancomycin variable dose per unstable renal function (pharmacist dosing), , Does not apply, See admin instructions, Einar Grad, RPH   Patients Current Diet:  Diet Order                  DIET DYS 3 Room service appropriate? Yes; Fluid consistency: Thin  Diet effective now                       Precautions / Restrictions Precautions Precautions: Fall Precaution Comments: tremors/jerky movements ( increasing in use of functional tasks) Restrictions Weight Bearing Restrictions: No    Has the patient had 2 or more falls or a fall with injury in the past year? No   Prior Activity Level Limited Community (1-2x/wk): MD appointment and dialysis center   Prior Functional Level Self Care: Did the patient need help bathing, dressing, using the toilet or eating? Needed some help   Indoor Mobility: Did the patient need assistance with walking from room to room (with or without device)? Independent   Stairs: Did the patient need assistance with internal or external stairs (with or without device)? Needed some help   Functional Cognition: Did the patient need help planning regular tasks such as  shopping or remembering to take medications? Independent   Patient Information Are you of Hispanic, Latino/a,or Spanish origin?: A. No, not of Hispanic, Latino/a, or Spanish origin What is your race?: A. White Do you need or want an interpreter to communicate with a doctor or  health care staff?: 0. No   Patient's Response To:  Health Literacy and Transportation Is the patient able to respond to health literacy and transportation needs?: Yes Health Literacy - How often do you need to have someone help you when you read instructions, pamphlets, or other written material from your doctor or pharmacy?: Never In the past 12 months, has lack of transportation kept you from medical appointments or from getting medications?: No In the past 12 months, has lack of transportation kept you from meetings, work, or from getting things needed for daily living?: No   Home Assistive Devices / [place] Devices/Equipment: Environmental consultant (specify type), Cane (specify quad or straight) Home Equipment: Cane - single point, Conservation officer, nature (2 wheels), Wheelchair - manual   Prior Device Use: Indicate devices/aids used by the patient prior to current illness, exacerbation or injury? Walker and cane   Current Functional Level Cognition   Overall Cognitive Status: Impaired/Different from baseline Difficult to assess due to: Impaired communication Current Attention Level: Focused Orientation Level: Oriented X4 Following Commands: Follows multi-step commands inconsistently Safety/Judgement: Decreased awareness of safety General Comments: pt continues to make good improvements, able to verbalize needs but seems to have had mix up in communication between myself and RN, pt otherwise able to follow all simple cues and self-monitor for safety and fatigue    Extremity Assessment (includes Sensation/Coordination)   Upper Extremity Assessment: RUE deficits/detail, LUE deficits/detail RUE Deficits / Details: Pt  noted to still have difficulties with tremors RUE Coordination: decreased fine motor LUE Deficits / Details: Pt noted to still have difficulties with tremors  Lower Extremity Assessment: Defer to PT evaluation RLE Deficits / Details: Strength 5/5 LLE Deficits / Details: Strength 5/5     ADLs   Overall ADL's : Needs assistance/impaired Eating/Feeding: Set up, Sitting Eating/Feeding Details (indicate cue type and reason): Pt noted to start to gag/cough with oral movements at this time but did not have anything in mouth. SLP came into room and described what was occuring at this time Grooming: Wash/dry hands, Wash/dry face, Set up, Sitting Upper Body Bathing: Minimal assistance, Cueing for safety, Cueing for sequencing, Sitting Lower Body Bathing: Moderate assistance, Sit to/from stand Upper Body Dressing : Minimal assistance, Sitting Lower Body Dressing: Moderate assistance, Sit to/from stand Toilet Transfer: Min guard, Cueing for safety, Cueing for sequencing Toileting- Clothing Manipulation and Hygiene: Cueing for safety, Cueing for sequencing, Minimal assistance Toileting - Clothing Manipulation Details (indicate cue type and reason): Pt was able to start post set up but fatigued in standing and requires assist to have good quality of hygiene Functional mobility during ADLs: Min guard, Rolling walker (2 wheels) General ADL Comments: Pt reporting they feel like they have sea legs with prolonged standing     Mobility   Overal bed mobility: Modified Independent Bed Mobility: Supine to Sit Rolling: Max assist Supine to sit: Supervision, HOB elevated (head at 90 degrees) Sit to supine: Mod assist General bed mobility comments: pt sitting EOB upon my arrival     Transfers   Overall transfer level: Needs assistance Equipment used: Rolling walker (2 wheels), 1 person hand held assist, None Transfers: Sit to/from Stand Sit to Stand: Min assist, Min guard General transfer comment:  progressed from minA to minG with continued reps. pt completing x5 in 24 seconds, x6 in 30 seconds     Ambulation / Gait / Stairs / Wheelchair Mobility   Ambulation/Gait Ambulation/Gait assistance: Min assist, Mod assist Gait Distance (Feet): 30 Feet (+15  ft) Assistive device: Rolling walker (2 wheels), 1 person hand held assist Gait Pattern/deviations: Step-to pattern, Decreased stride length, Decreased dorsiflexion - right, Decreased dorsiflexion - left, Shuffle, Narrow base of support, Trunk flexed General Gait Details: pt with small strides and trunk flexed, cues for posture. trial without DME and pt needing modA through HHA as well as reaching for furniture in room to steady Gait velocity: decreased Gait velocity interpretation: <1.31 ft/sec, indicative of household ambulator     Posture / Balance Dynamic Sitting Balance Sitting balance - Comments: able to static sit unsupported in chair without UE support or assist. Balance Overall balance assessment: Needs assistance Sitting-balance support: Feet supported Sitting balance-Leahy Scale: Good Sitting balance - Comments: able to static sit unsupported in chair without UE support or assist. Postural control: Left lateral lean Standing balance support: Single extremity supported, During functional activity Standing balance-Leahy Scale: Poor Standing balance comment: BUE support preferred, able to manage short bout with single UE support and modA     Special needs/care consideration CCPD nightly ; Daughter, Pam assist with CCPD nightly    Previous Home Environment  Living Arrangements:  [name], daughter to assist)  Lives With: Family Available Help at Discharge: Family, Available 24 hours/day Type of Home: House Home Layout: Able to live on main level with bedroom/bathroom, Laundry or work area in basement Home Access: Stairs to enter Entrance Stairs-Rails: None Technical brewer of Steps: 1 Bathroom Shower/Tub: Research officer, political party: Yes (daughter/pt think a walker should be able to fit into the bathroom) How Accessible: Accessible via walker Home Care Services: No   Discharge Living Setting Plans for Discharge Living Setting: Patient's home Type of Home at Discharge: House Discharge Home Layout: Able to live on main level with bedroom/bathroom, Laundry or work area in basement Discharge Home Access: Stairs to enter Entrance Stairs-Rails: None Technical brewer of Steps: 1 Discharge Bathroom Shower/Tub: Designer, fashion/clothing: Standard Discharge Bathroom Accessibility: Yes (daughter/pt think a walker should be able to fit into the bathroom) How Accessible: Accessible via walker Does the patient have any problems obtaining your medications?: Yes (Describe) (some medications are expensive)   Social/Family/Support Systems Patient Roles: Parent Contact Information: daughter, Pam Anticipated Caregiver: Pam, daughter Anticipated Caregiver's Contact Information: see contacts Caregiver Availability: 24/7 Discharge Plan Discussed with Primary Caregiver: Yes Is Caregiver In Agreement with Plan?: Yes Does Caregiver/Family have Issues with Lodging/Transportation while Pt is in Rehab?: Yes   Granddaughter, Environmental education officer lives with patient   Goals Patient/Family Goal for Rehab: supervision with PT, OT and SLP Expected length of stay: ELOS 10 to 14 days Pt/Family Agrees to Admission and willing to participate: Yes Program Orientation Provided & Reviewed with Pt/Caregiver Including Roles  & Responsibilities: Yes   Decrease burden of Care through IP rehab admission: NA   Possible need for SNF placement upon discharge: Not anticipated   Patient Condition: I have reviewed medical records from [facility], spoken with CSW, and patient and daughter. I met with patient at the bedside and discussed via phone for inpatient rehabilitation assessment.   Patient will benefit from ongoing PT and OT, can actively participate in 3 hours of therapy a day 5 days of the week, and can make measurable gains during the admission.  Patient will also benefit from the coordinated team approach during an Inpatient Acute Rehabilitation admission.  The patient will receive intensive therapy as well as Rehabilitation physician, nursing, social worker, and care management interventions.  Due to safety,  skin/wound care, disease management, medication administration, and patient education the patient requires 24 hour a day rehabilitation nursing.  The patient is currently Min G-Mod A with mobility and basic ADLs.  Discharge setting and therapy post discharge at home with home health is anticipated.  Patient has agreed to participate in the Acute Inpatient Rehabilitation Program and will admit today.   Preadmission Screen Completed By:  Danne Baxter RN MSN 09/07/2021 12:15 PM ______________________________________________________________________   Discussed status with Dr. Dagoberto Ligas on 09/07/21 at 1215 and received approval for admission today.   Admission Coordinator:  Danne Baxter RN MSN, time  1215 Date  09/07/21    Assessment/Plan: Diagnosis: Does the need for close, 24 hr/day Medical supervision in concert with the patient's rehab needs make it unreasonable for this patient to be served in a less intensive setting? Yes Co-Morbidities requiring supervision/potential complications: perotonitis- ESRD on (+_PD; toxic metabolic encephalopathy- stopped Cephalosporins due to myoclonic jerks Due to bladder management, bowel management, safety, skin/wound care, disease management, medication administration, pain management, and patient education, does the patient require 24 hr/day rehab nursing? Yes Does the patient require coordinated care of a physician, rehab nurse, PT, OT, and SLP to address physical and functional deficits in the context of the above medical  diagnosis(es)? Yes Addressing deficits in the following areas: balance, endurance, locomotion, strength, transferring, bowel/bladder control, bathing, dressing, feeding, grooming, toileting, and cognition Can the patient actively participate in an intensive therapy program of at least 3 hrs of therapy 5 days a week? Yes The potential for patient to make measurable gains while on inpatient rehab is good and fair Anticipated functional outcomes upon discharge from inpatient rehab: supervision PT, supervision OT, supervision SLP Estimated rehab length of stay to reach the above functional goals is: 10-14 days Anticipated discharge destination: Home 10. Overall Rehab/Functional Prognosis: good     MD Signature:           Revision History                               Note Details  Jan Fireman, MD File Time 09/07/2021 12:19 PM  Author Type Physician Status Signed  Last Editor Courtney Heys, MD Service Physical Medicine and Rehabilitation

## 2021-09-08 LAB — COMPREHENSIVE METABOLIC PANEL
ALT: <5 U/L (ref 0–44)
AST: 19 U/L (ref 15–41)
Albumin: 2.3 g/dL — ABNORMAL LOW (ref 3.5–5.0)
Alkaline Phosphatase: 85 U/L (ref 38–126)
Anion gap: 17 — ABNORMAL HIGH (ref 5–15)
BUN: 64 mg/dL — ABNORMAL HIGH (ref 8–23)
CO2: 25 mmol/L (ref 22–32)
Calcium: 9.3 mg/dL (ref 8.9–10.3)
Chloride: 94 mmol/L — ABNORMAL LOW (ref 98–111)
Creatinine, Ser: 12.36 mg/dL — ABNORMAL HIGH (ref 0.44–1.00)
GFR, Estimated: 3 mL/min — ABNORMAL LOW (ref >60)
Glucose, Bld: 114 mg/dL — ABNORMAL HIGH (ref 70–99)
Potassium: 3.4 mmol/L — ABNORMAL LOW (ref 3.5–5.1)
Sodium: 136 mmol/L (ref 135–145)
Total Bilirubin: 0.4 mg/dL (ref 0.3–1.2)
Total Protein: 6 g/dL — ABNORMAL LOW (ref 6.5–8.1)

## 2021-09-08 LAB — CBC WITH DIFFERENTIAL/PLATELET
Abs Immature Granulocytes: 0.2 10*3/uL — ABNORMAL HIGH (ref 0.00–0.07)
Basophils Absolute: 0.1 10*3/uL (ref 0.0–0.1)
Basophils Relative: 1 %
Eosinophils Absolute: 0.4 10*3/uL (ref 0.0–0.5)
Eosinophils Relative: 4 %
HCT: 27.1 % — ABNORMAL LOW (ref 36.0–46.0)
Hemoglobin: 9.4 g/dL — ABNORMAL LOW (ref 12.0–15.0)
Immature Granulocytes: 2 %
Lymphocytes Relative: 13 %
Lymphs Abs: 1.5 10*3/uL (ref 0.7–4.0)
MCH: 36.4 pg — ABNORMAL HIGH (ref 26.0–34.0)
MCHC: 34.7 g/dL (ref 30.0–36.0)
MCV: 105 fL — ABNORMAL HIGH (ref 80.0–100.0)
Monocytes Absolute: 1.2 10*3/uL — ABNORMAL HIGH (ref 0.1–1.0)
Monocytes Relative: 10 %
Neutro Abs: 8.5 10*3/uL — ABNORMAL HIGH (ref 1.7–7.7)
Neutrophils Relative %: 70 %
Platelets: 285 10*3/uL (ref 150–400)
RBC: 2.58 MIL/uL — ABNORMAL LOW (ref 3.87–5.11)
RDW: 14.4 % (ref 11.5–15.5)
WBC: 11.8 10*3/uL — ABNORMAL HIGH (ref 4.0–10.5)
nRBC: 0 % (ref 0.0–0.2)

## 2021-09-08 MED ORDER — DELFLEX-LC/2.5% DEXTROSE 394 MOSM/L IP SOLN: INTRAPERITONEAL | Status: DC

## 2021-09-08 MED ORDER — CHLORHEXIDINE GLUCONATE CLOTH 2 % EX PADS
6.0000 | MEDICATED_PAD | Freq: Every day | CUTANEOUS | Status: DC
  Administered 2021-09-08 – 2021-09-10 (×3): 6 via TOPICAL

## 2021-09-08 MED ORDER — DELFLEX-LC/4.25% DEXTROSE 483 MOSM/L IP SOLN: INTRAPERITONEAL | Status: DC

## 2021-09-08 MED ORDER — GENTAMICIN SULFATE 0.1 % EX CREA
1.0000 "application " | TOPICAL_CREAM | Freq: Every day | CUTANEOUS | Status: DC
  Administered 2021-09-09 – 2021-09-12 (×5): 1 via TOPICAL
  Filled 2021-09-08: qty 15

## 2021-09-08 MED ORDER — DELFLEX-LC/2.5% DEXTROSE 394 MOSM/L IP SOLN
INTRAPERITONEAL | Status: DC
  Administered 2021-09-09: 5000 mL via INTRAPERITONEAL

## 2021-09-08 MED ORDER — DELFLEX-LC/1.5% DEXTROSE 344 MOSM/L IP SOLN: INTRAPERITONEAL | Status: DC

## 2021-09-08 MED ORDER — DELFLEX-LC/1.5% DEXTROSE 344 MOSM/L IP SOLN
INTRAPERITONEAL | Status: DC
Start: 2021-09-08 — End: 2021-09-12
  Administered 2021-09-11: 5000 mL via INTRAPERITONEAL

## 2021-09-08 NOTE — Progress Notes (Signed)
Occupational Therapy Session Note  Patient Details  Name: Felicia Williams MRN: [id number] Date of Birth: 01-23-1941  Today's Date: 09/08/2021 OT Individual Time: 1429-1536 OT Individual Time Calculation (min): 67 min    Short Term Goals: Week 1:  OT Short Term Goal 1 (Week 1): STG = LTGs  Skilled Therapeutic Interventions/Progress Updates:  Pt greeted seated EOB agreeable to OT intervention. Session focus on BADL reeducation, functional mobility, dynamic standing balance, simulated IADLs, LB/UB strength and decreasing overall caregiver burden.   Pt completed ambulatory transfer to sink with no AD and CGA for hair washing, OTA washed pts hair with total A with pt able to brush out hair from sitting with supervision. Pt ambulated to gym with RW and CGA. Pt completed x10 Sit<>stand from EOM with no UE support curling 3lb dowel rod to chest during every stand with supervision assist to increase overall activity tolerance, LB and UB strength for higher level functional mobility tasks. Pt completed x10 Standing rows forward/backward and x10 and standing chest presses with 3 lb dowel rod to increase BUE strength and endurance as well as promote improved standing balance and endurance. Pt completed x10  stands while pushing ball pushing OH to promote increase LB strength and endurance. Pt abel to ambulate around gym with rw and cGA to collect items around room to simulate IADL tasks of cleaning at home, pt also able to dynamically reach out BOS with RUE to stack items OH to simulate putting items in closet, pt completed IADL task with supervision. Pt ambulated back to room with rw and CGA where pt sat to put in hair rollers. Pt left seated EOB with all needs within reach.                        Therapy Documentation Precautions:  Precautions Precautions: Fall Precaution Comments: peritoneal dialysis catheter Restrictions Weight Bearing Restrictions: No     Pain: no pain reported during session      Therapy/Group: Individual Therapy  [name] 09/08/2021, 3:38 PM

## 2021-09-08 NOTE — IPOC Note (Signed)
Overall Plan of Care [facility]) Patient Details Name: Felicia Williams MRN: [id number] DOB: May 29, 1941  Admitting Diagnosis: [facility] Problems: Principal Problem:   Debility     Functional Problem List: Nursing Medication Management, Safety, Bowel, Endurance  PT Balance, Endurance, Motor, Nutrition, Safety, Sensory, Skin Integrity  OT Balance, Endurance, Motor  SLP    TR         Basic ADLs: OT Bathing, Dressing, Toileting, Grooming     Advanced  ADLs: OT       Transfers: PT Bed Mobility, Bed to Chair, Car, Manufacturing systems engineer, Metallurgist: PT Ambulation, Stairs     Additional Impairments: OT None  SLP        TR      Anticipated Outcomes Item Anticipated Outcome  Self Feeding no goal, pt is independent  Swallowing      Basic self-care  Mod I  Toileting  Mod I   Bathroom Transfers Mod I  Bowel/Bladder  manage bowel w mod I  Transfers  ModI  Locomotion  spv  Communication     Cognition     Pain  n/a  Safety/Judgment  maintain safety w cues   Therapy Plan: PT Intensity: Minimum of 1-2 x/day ,45 to 90 minutes PT Frequency: 5 out of 7 days PT Duration Estimated Length of Stay: 3-5 days OT Intensity: Minimum of 1-2 x/day, 45 to 90 minutes OT Frequency: 5 out of 7 days OT Duration/Estimated Length of Stay: 7-8 days     Due to the current state of emergency, patients may not be receiving their 3-hours of Medicare-mandated therapy.   Team Interventions: Nursing Interventions Disease Management/Prevention, Medication Management, Discharge Planning, Bowel Management, Patient/Family Education  PT interventions Ambulation/gait training, DME/adaptive equipment instruction, UE/LE Strength taining/ROM, Psychosocial support, Balance/vestibular training, Functional electrical stimulation, Skin care/wound management, UE/LE Coordination activities, Cognitive remediation/compensation, Functional mobility training, Splinting/orthotics,  Visual/perceptual remediation/compensation, Community reintegration, Neuromuscular re-education, IT trainer, Wheelchair propulsion/positioning, Discharge planning, Pain management, Therapeutic Activities, Disease management/prevention, Patient/family education, Therapeutic Exercise  OT Interventions Training and development officer, Discharge planning, DME/adaptive equipment instruction, Functional mobility training, Self Care/advanced ADL retraining, Patient/family education, Psychosocial support, Therapeutic Activities, Therapeutic Exercise, UE/LE Strength taining/ROM, UE/LE Coordination activities  SLP Interventions    TR Interventions    SW/CM Interventions Discharge Planning, Psychosocial Support, Patient/Family Education   Barriers to Discharge MD  Medical stability  Nursing Decreased caregiver support, Home environment access/layout, Other (comments) (PD) 2 level main B+B, 1 ste no rails w daughter; PD  PT      OT      SLP      SW Decreased caregiver support, Lack of/limited family support, Insurance for SNF coverage, Other (comments) Pt is on peritoneal dialysis.   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 She was found to be septic due to peritonitis with lactic acidosis, milky fluid with 8667 WBC and mesothelial cells. She was  started on Vanc/Cefepime for treatment of PD  associated peritonitis.   BC/peritoneal cultures negative.  Hospital course significant for encephalopathy with lethargy with non verbal state, myoclonus, confusion and hallucinations that started on 02/09. Cefepime changed to ancef without improvement. Dr. Leonel Ramsay consulted for input. MRI brain negative. EEG negative for seizures and showed frequent runs of induced sharp GPDs c/w severe toxic/metabolic encephalopathy.    Confusion gradually resolved with improvement in alertness and verbal output back to baseline. Continues on Meropenum and Vanc to complete 2 week course of antibiotic regimen. Tremors/jerks with activity improving but she continue to be limited by weakness with unsteady gait, fatigue     See Team Conference Notes for weekly updates to the plan of care

## 2021-09-08 NOTE — Evaluation (Signed)
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 Laural Golden.   GERD (gastroesophageal reflux disease)    Glomerulonephritis    Gout    Mixed hyperlipidemia    Renal insufficiency    Spondylosis    Past Surgical History:  Past Surgical History:  Procedure Laterality Date   APPENDECTOMY     CATARACT EXTRACTION     COLONOSCOPY N/A 12/28/2015   Procedure: COLONOSCOPY;  Surgeon: Rogene Houston, MD;  [facility];  Service: Endoscopy;  Laterality: N/A;  815   ESOPHAGOGASTRODUODENOSCOPY N/A 11/16/2020   Procedure: ESOPHAGOGASTRODUODENOSCOPY (EGD);  Surgeon: Rogene Houston, MD;  Location: [facility];  Service: Endoscopy;  Laterality: N/A;  1:15   IR FLUORO GUIDE CV LINE RIGHT  01/16/2019   IR REMOVAL TUN CV CATH W/O FL  07/01/2019   IR US GUIDE VASC ACCESS RIGHT  01/16/2019   POLYPECTOMY  11/16/2020   Procedure: POLYPECTOMY;  Surgeon: Rogene Houston, MD;  Location: [facility];  Service: Endoscopy;;  gastric   TUBAL LIGATION      Assessment & Plan Clinical Impression: Patient is a 81 y.o. 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 She was found to be septic due to peritonitis with lactic  acidosis, milky fluid with 8667 WBC and mesothelial cells. She was started on Vanc/Cefepime for treatment of PD associated peritonitis.   BC/peritoneal cultures negative.  Hospital course significant for encephalopathy with lethargy with non verbal state, myoclonus, confusion and hallucinations that started on 02/09. Cefepime changed to ancef without improvement. Dr. Leonel Ramsay consulted for input. MRI brain negative. EEG negative for seizures and showed frequent runs of induced sharp GPDs c/w severe toxic/metabolic encephalopathy.    Confusion gradually resolved with improvement in alertness and verbal output back to baseline. Continues on Meropenum and Vanc to complete 2 week course of antibiotic regimen. Tremors/jerks with activity improving but she continue to be limited by weakness with unsteady gait, fatigue and STM deficits with poor safety awareness. CIR recommended due to functional decline.    Pt reports no myoclonus activity seen- nurse agrees for today.  Denies pain currently, but had this AM when was walking- chronic LBP. Tylenol doesn't help and so avoids meds.  LBM this AM- loose stools- actually asked for imodium while I was there.  Almost anuric per pt- rare formation of urine.   Patient currently requires  CGA  with mobility secondary to muscle weakness, decreased cardiorespiratoy endurance, and decreased standing balance, decreased postural control, and decreased balance strategies.  Prior to hospitalization, patient was independent  with mobility and lived with Family (granddaughter lives with pt (46 yo)) in a House home.  Home access is 1Stairs to enter.  Patient will benefit from skilled PT intervention to maximize safe functional mobility and minimize fall risk for planned discharge home with intermittent assist.  Anticipate patient will benefit from follow up [facility] at discharge.  PT - End of Session Activity Tolerance: Tolerates 30+ min activity with multiple rests Endurance  Deficit: Yes Endurance Deficit Description: pt states her legs get fatigued PT Assessment Rehab Potential (ACUTE/IP ONLY): Good PT Patient demonstrates impairments in the following area(s): Balance;Endurance;Motor;Nutrition;Safety;Sensory;Skin Integrity PT Transfers Functional Problem(s): Bed Mobility;Bed to Chair;Car;Furniture PT Locomotion Functional Problem(s): Ambulation;Stairs PT Plan PT Intensity: Minimum of 1-2 x/day ,45 to 90 minutes PT Frequency: 5 out of 7 days PT Duration Estimated Length of Stay: 3-5 days PT Treatment/Interventions: Ambulation/gait training;DME/adaptive equipment instruction;UE/LE Strength taining/ROM;Psychosocial support;Balance/vestibular training;Functional electrical stimulation;Skin care/wound management;UE/LE Coordination activities;Cognitive remediation/compensation;Functional mobility training;Splinting/orthotics;Visual/perceptual remediation/compensation;Community reintegration;Neuromuscular re-education;Stair training;Wheelchair propulsion/positioning;Discharge planning;Pain management;Therapeutic Activities;Disease management/prevention;Patient/family education;Therapeutic Exercise PT Transfers Anticipated Outcome(s): ModI PT Locomotion Anticipated Outcome(s): spv PT Recommendation Recommendations for Other Services: Therapeutic Recreation consult Therapeutic Recreation Interventions: Pet therapy;Stress management Follow Up Recommendations: Home health PT;24 hour supervision/assistance Patient destination: Home Equipment Recommended: To be determined Equipment Details: owns a RW, SPC, wc   PT Evaluation Precautions/Restrictions Precautions Precautions: Fall Precaution Comments: peritoneal dialysis catheter Restrictions Weight Bearing Restrictions: No General   Vital Signs Pain Pain Assessment Pain Scale: 0-10 Pain Score: 0-No pain Pain Interference Pain Interference Pain Effect on Sleep: 0. Does not apply - I have not had any pain or  hurting in the past 5 days Pain Interference with Therapy Activities: 0. Does not apply - I have not received rehabilitationtherapy in the past 5 days Pain Interference with Day-to-Day Activities: 1. Rarely or not at all Home Living/Prior [place] Available Help at Discharge: Family;Available 24 hours/day Type of Home: House Home Access: Stairs to enter [employer] of Steps: 1 Entrance Stairs-Rails: None Home Layout: Able to live on main level with bedroom/bathroom;Laundry or work area in basement [employer] Shower/Tub: Chiropodist: Programmer, systems: Yes  Lives With: Family (granddaughter lives with pt (76 yo)) Prior Function Level of Independence: Independent with basic ADLs;Independent with homemaking with ambulation;Independent with gait;Independent with transfers  Able to Take Stairs?: Yes Driving: Yes Vocation: Retired Leisure: Hobbies-no (only watches TV - would benefit from TR) Vision/Perception  Vision - History Ability to See in Adequate Light: 0 Adequate Perception Perception: Within Functional Limits Praxis Praxis: Intact  Cognition Overall Cognitive Status: Within Functional Limits for tasks assessed Arousal/Alertness: Awake/alert Orientation Level: Oriented X4 Year: 2023 Month: February Day of Week: Correct Memory: Appears intact Immediate Memory Recall: Sock;Blue;Bed Memory Recall Sock: Without Cue Memory Recall Blue: Without Cue Memory Recall Bed: Without Cue Awareness: Appears intact Problem Solving: Appears intact Safety/Judgment: Appears intact Comments: pt states "wow I am thinking slowly" but with ADLs pt did an excellent job, followed directions well Sensation Sensation Light Touch: Appears Intact Hot/Cold: Appears Intact Proprioception: Appears Intact Stereognosis: Appears Intact Coordination Gross Motor Movements are Fluid and Coordinated: No Fine Motor Movements are Fluid and  Coordinated: Yes Coordination and Movement Description: LE weakness, pt takes small shuffling steps Finger Nose Finger Test: [facility] Heel Shin Test: [facility] Motor  Motor Motor: Other (comment) Motor - Skilled Clinical Observations: generalized motor weakness   Trunk/Postural Assessment  Cervical Assessment Cervical Assessment: Within Functional Limits Thoracic Assessment Thoracic Assessment: Exceptions to [facility] Lumbar Assessment Lumbar Assessment: Exceptions to [facility] Postural Control Postural Control: Deficits on evaluation Righting Reactions: delayed and inadequate Protective Responses: delayed and inadequate  Balance Balance Balance  Assessed: Yes Standardized Balance Assessment Standardized Balance Assessment: Berg Balance Test Berg Balance Test Sit to Stand: Able to stand without using hands and stabilize independently Standing Unsupported: Able to stand 2 minutes with supervision Sitting with Back Unsupported but Feet Supported on Floor or Stool: Able to sit safely and securely 2 minutes Stand to Sit: Sits safely with minimal use of hands Transfers: Able to transfer with verbal cueing and /or supervision Standing Unsupported with Eyes Closed: Able to stand 10 seconds with supervision Standing Ubsupported with Feet Together: Able to place feet together independently and stand for 1 minute with supervision From Standing, Reach Forward with Outstretched Arm: Reaches forward but needs supervision From Standing Position, Pick up Object from Floor: Able to pick up shoe, needs supervision From Standing Position, Turn to Look Behind Over each Shoulder: Looks behind from both sides and weight shifts well Turn 360 Degrees: Able to turn 360 degrees safely but slowly Standing Unsupported, Alternately Place Feet on Step/Stool: Able to complete 4 steps without aid or supervision Standing Unsupported, One Foot in Front: Able to plae foot ahead of the other independently and hold 30 seconds Standing  on One Leg: Able to lift leg independently and hold > 10 seconds Total Score: 42 Dynamic Sitting Balance Dynamic Sitting - Level of Assistance: 5: Stand by assistance Static Standing Balance Static Standing - Level of Assistance: 5: Stand by assistance Dynamic Standing Balance Dynamic Standing - Level of Assistance: 4: Min assist Extremity Assessment  RUE Assessment Active Range of Motion (AROM) Comments: WFL General Strength Comments: 4/5 LUE Assessment Active Range of Motion (AROM) Comments: WFL General Strength Comments: 4/5 RLE Assessment RLE Assessment: Within Functional Limits LLE Assessment LLE Assessment: Within Functional Limits  Care Tool Care Tool Bed Mobility Roll left and right activity   Roll left and right assist level: Independent    Sit to lying activity   Sit to lying assist level: Supervision/Verbal cueing    Lying to sitting on side of bed activity   Lying to sitting on side of bed assist level: the ability to move from lying on the back to sitting on the side of the bed with no back support.: Supervision/Verbal cueing     Care Tool Transfers Sit to stand transfer   Sit to stand assist level: Supervision/Verbal cueing    Chair/bed transfer   Chair/bed transfer assist level: Supervision/Verbal cueing     Toilet transfer   Assist Level: Contact Guard/Touching assist    Car transfer Car transfer activity did not occur: Environmental limitations        Care Tool Locomotion Ambulation   Assist level: Contact Guard/Touching assist Assistive device: Walker-rolling Max distance: 450  Walk 10 feet activity   Assist level: Supervision/Verbal cueing Assistive device: Walker-rolling   Walk 50 feet with 2 turns activity   Assist level: Supervision/Verbal cueing Assistive device: Walker-rolling  Walk 150 feet activity   Assist level: Supervision/Verbal cueing Assistive device: Walker-rolling  Walk 10 feet on uneven surfaces activity   Assist level:  Contact Guard/Touching assist Assistive device: Walker-rolling  Stairs   Assist level: Contact Guard/Touching assist Stairs assistive device: 2 hand rails Max number of stairs: 16  Walk up/down 1 step activity   Walk up/down 1 step (curb) assist level: Supervision/Verbal cueing Walk up/down 1 step or curb assistive device: 2 hand rails  Walk up/down 4 steps activity   Walk up/down 4 steps assist level: Contact Guard/Touching assist Walk up/down 4 steps assistive device: 2 hand rails  Walk up/down 12 steps activity   Walk up/down 12 steps assist level: Contact Guard/Touching assist Walk up/down 12 steps assistive device: 2 hand rails  Pick up small objects from floor   Pick up small object from the floor assist level: Supervision/Verbal cueing    Wheelchair Is the patient using a wheelchair?: No          Wheel 50 feet with 2 turns activity      Wheel 150 feet activity        Refer to Care Plan for Long Term Goals  SHORT TERM GOAL WEEK 1 PT Short Term Goal 1 (Week 1): STG=LTG based on ELOS  Recommendations for other services: Therapeutic Recreation  Pet therapy and Stress management  Skilled Therapeutic Intervention Mobility Bed Mobility Bed Mobility: Rolling Right;Rolling Left;Supine to Sit;Sit to Supine Rolling Right: Independent Rolling Left: Independent Supine to Sit: Supervision/Verbal cueing Sit to Supine: Supervision/Verbal cueing Transfers Transfers: Sit to Stand;Stand to Sit;Stand Pivot Transfers Sit to Stand: Supervision/Verbal cueing Stand to Sit: Supervision/Verbal cueing Stand Pivot Transfers: Supervision/Verbal cueing Stand Pivot Transfer Details: Verbal cues for safe use of DME/AE Transfer (Assistive device): Rolling walker Locomotion  Gait Ambulation: Yes Gait Assistance: Supervision/Verbal cueing;Contact Guard/Touching assist Gait Distance (Feet): 300 Feet Assistive device: Rolling walker Gait Assistance Details: Verbal cues for safe use of  DME/AE Gait Gait: Yes Gait Pattern: Impaired Gait Pattern: Step-through pattern;Trunk flexed;Narrow base of support Gait velocity: decreased Stairs / Additional Locomotion Stairs: Yes Stairs Assistance: Supervision/Verbal cueing;Contact Guard/Touching assist Stair Management Technique: Two rails Number of Stairs: 16 Height of Stairs: 6 Ramp: Supervision/Verbal cueing Curb: Supervision/Verbal cueing Wheelchair Mobility Wheelchair Mobility: No   Discharge Criteria: Patient will be discharged from PT if patient refuses treatment 3 consecutive times without medical reason, if treatment goals not met, if there is a change in medical status, if patient makes no progress towards goals or if patient is discharged from hospital.  The above assessment, treatment plan, treatment alternatives and goals were discussed and mutually agreed upon: by patient  Debbora Dus 09/08/2021, 12:02 PM

## 2021-09-08 NOTE — Plan of Care (Signed)
°  Problem: RH Balance Goal: LTG Patient will maintain dynamic sitting balance (PT) Description: LTG:  Patient will maintain dynamic sitting balance with assistance during mobility activities (PT) Flowsheets (Taken 09/08/2021 1126) LTG: Pt will maintain dynamic sitting balance during mobility activities with:: Independent Goal: LTG Patient will maintain dynamic standing balance (PT) Description: LTG:  Patient will maintain dynamic standing balance with assistance during mobility activities (PT) Flowsheets (Taken 09/08/2021 1126) LTG: Pt will maintain dynamic standing balance during mobility activities with:: Independent with assistive device    Problem: Sit to Stand Goal: LTG:  Patient will perform sit to stand with assistance level (PT) Description: LTG:  Patient will perform sit to stand with assistance level (PT) Flowsheets (Taken 09/08/2021 1126) LTG: PT will perform sit to stand in preparation for functional mobility with assistance level: Independent with assistive device   Problem: RH Bed Mobility Goal: LTG Patient will perform bed mobility with assist (PT) Description: LTG: Patient will perform bed mobility with assistance, with/without cues (PT). Flowsheets (Taken 09/08/2021 1126) LTG: Pt will perform bed mobility with assistance level of: Independent   Problem: RH Bed to Chair Transfers Goal: LTG Patient will perform bed/chair transfers w/assist (PT) Description: LTG: Patient will perform bed to chair transfers with assistance (PT). Flowsheets (Taken 09/08/2021 1126) LTG: Pt will perform Bed to Chair Transfers with assistance level: Independent with assistive device    Problem: RH Car Transfers Goal: LTG Patient will perform car transfers with assist (PT) Description: LTG: Patient will perform car transfers with assistance (PT). Flowsheets (Taken 09/08/2021 1126) LTG: Pt will perform car transfers with assist:: Supervision/Verbal cueing   Problem: RH Ambulation Goal: LTG Patient  will ambulate in controlled environment (PT) Description: LTG: Patient will ambulate in a controlled environment, # of feet with assistance (PT). Flowsheets (Taken 09/08/2021 1126) LTG: Pt will ambulate in controlled environ  assist needed:: Supervision/Verbal cueing LTG: Ambulation distance in controlled environment: 150 Goal: LTG Patient will ambulate in home environment (PT) Description: LTG: Patient will ambulate in home environment, # of feet with assistance (PT). Flowsheets (Taken 09/08/2021 1126) LTG: Pt will ambulate in home environ  assist needed:: Independent with assistive device LTG: Ambulation distance in home environment: 50   Problem: RH Stairs Goal: LTG Patient will ambulate up and down stairs w/assist (PT) Description: LTG: Patient will ambulate up and down # of stairs with assistance (PT) Flowsheets (Taken 09/08/2021 1126) LTG: Pt will ambulate up/down stairs assist needed:: Supervision/Verbal cueing LTG: Pt will  ambulate up and down number of stairs: 12 Note: Handrails per home set up

## 2021-09-08 NOTE — Progress Notes (Signed)
Initial Nutrition Assessment  DOCUMENTATION CODES:   Non-severe (moderate) malnutrition in context of acute illness/injury  INTERVENTION:  - Continue Renal MVI daily  - Continue Ensure Enlive po BID, each supplement provides 350 kcal and 20 grams of protein. - Continue 30 mL Prosource plus BID, each supplement provides 100 kcal and 15 grams protein - Encourage adequate PO intake    NUTRITION DIAGNOSIS:   Moderate Malnutrition related to acute illness (acute metabolic encephalopathy) as evidenced by mild fat depletion, moderate muscle depletion.   GOAL:   Patient will meet greater than or equal to 90% of their needs   MONITOR:   PO intake, Supplement acceptance, Labs, Weight trends, I & O's  REASON FOR ASSESSMENT:   Malnutrition Screening Tool    ASSESSMENT:   Pt is a 81 y.o. female with medical history significant for ESRD on PD, HTN, gastroparesis, GERD, who was admitted on 08/26/21 with spontaneous bacterial peritonitis with sepsis. Hospital course significant for acute metabolic encephalopathy that started on 02/09 secondary to cefepime and peritonitis. Pt's alertness and verbal output has improved gradually and pt was admitted to CIR for functional decline due to encephalopathy/debility.  Pt is currently on a Dysphagia 3 diet, thin liquids. Per meal documentation, pt consumed 95% of breakfast today.   Pt sitting in recliner chair during visit. Per pt, pt endorses a good appetite today and reports that she ate all of her breakfast today. Per pt, she has been tolerating the Prosource and Ensure supplements well. She states that she had the Prosource this morning and that she typically drinks 1 Ensure a day; half of the Ensure before dinner and the other half after dinner. Pt reports that before she was admitted to CIR, her intake was poor due to not feeling well and having some confusion, but reports that since admission in CIR, her appetite and intake has been improving. Pt  reports that prior to being in the hospital, she had poor PO intake and reports that she typically ate 2 meals a day, which consisted of cheerios for breakfast and one other meal later in the day. Pt denies any nausea or constipation. Pt reports that she ambulated on her own at baseline and walked downstairs to get to her room which was in the basement. Per pt, she is receiving PD daily.   Discussed the importance of adequate PO intake and nutritional supplements to meet nutritional needs. Encouraged pt to continue eating consistent meals throughout the day and drinking nutritional supplements to aid in caloric and protein intake.   Pt meets criteria for moderate acute malnutrition, however, after review of dietary recall, suspect underlying chronic malnutrition may be present.   Weight history reviewed: Per weight history, pt's weight appears stable. Per pt, a dry weight for pt it ~125#.  Admit wt: 58.1 kg (129#) EDW: 57 kg   Overnight output from PD: 1.4 L   Medications reviewed and include: Prosource 62mL BID, Ensure BID, Pepcid, Rena-Vit, Renvela TID   Labs reviewed:  Potassium: 3.4 BUN: 64 ^  Cr: 12.36 ^  Phos: 6.6 ^  Hemoglobin: 9.4    NUTRITION - FOCUSED PHYSICAL EXAM:  Flowsheet Row Most Recent Value  Orbital Region Mild depletion  Upper Arm Region Mild depletion  Thoracic and Lumbar Region No depletion  Buccal Region Mild depletion  Temple Region No depletion  Clavicle Bone Region No depletion  Clavicle and Acromion Bone Region Mild depletion  Scapular Bone Region Mild depletion  Dorsal Hand Moderate depletion  Patellar  Region Moderate depletion  Anterior Thigh Region Moderate depletion  Posterior Calf Region Severe depletion  Edema (RD Assessment) None  Hair Reviewed  Eyes Reviewed  Mouth Reviewed  Skin Reviewed  Nails Reviewed       Diet Order:   Diet Order             DIET DYS 3 Room service appropriate? Yes; Fluid consistency: Thin  Diet effective now                    EDUCATION NEEDS:   Education needs have been addressed  Skin:  Skin Assessment: Reviewed RN Assessment  Last BM:  2/15  Height:   Ht Readings from Last 1 Encounters:  09/07/21 4\' 11"  (1.499 m)    Weight:   Wt Readings from Last 1 Encounters:  09/07/21 58.1 kg    BMI:  Body mass index is 25.87 kg/m.  Estimated Nutritional Needs:   Kcal:  1800 - 2000  Protein:  90 - 105 grams  Fluid:  1000 mL plus UOP    Maryruth Hancock, Dietetic Intern 09/08/2021 2:23 PM

## 2021-09-08 NOTE — Evaluation (Signed)
Occupational Therapy Assessment and Plan  Patient Details  Name: Felicia Williams MRN: [id number] Date of Birth: 1941-01-31  OT Diagnosis: muscle weakness (generalized) Rehab Potential: Rehab Potential (ACUTE ONLY): Excellent ELOS: 7-8 days   Today's Date: 09/08/2021 OT Individual Time: 0930-1030 OT Individual Time Calculation (min): 60 min     Hospital Problem: Principal Problem:   Debility   Past Medical History:  Past Medical History:  Diagnosis Date   Anemia in stage 4 chronic kidney disease ([place]) 08/18/2018   Coccyx contusion--with chronic pain due to fall    Depression    Essential hypertension, benign    Gastric polyps    Gastroparesis    followed by Dr. Laural Golden.   GERD (gastroesophageal reflux disease)    Glomerulonephritis    Gout    Mixed hyperlipidemia    Renal insufficiency    Spondylosis    Past Surgical History:  Past Surgical History:  Procedure Laterality Date   APPENDECTOMY     CATARACT EXTRACTION     COLONOSCOPY N/A 12/28/2015   Procedure: COLONOSCOPY;  Surgeon: Rogene Houston, MD;  [facility];  Service: Endoscopy;  Laterality: N/A;  815   ESOPHAGOGASTRODUODENOSCOPY N/A 11/16/2020   Procedure: ESOPHAGOGASTRODUODENOSCOPY (EGD);  Surgeon: Rogene Houston, MD;  Location: [facility];  Service: Endoscopy;  Laterality: N/A;  1:15   IR FLUORO GUIDE CV LINE RIGHT  01/16/2019   IR REMOVAL TUN CV CATH W/O FL  07/01/2019   IR US GUIDE VASC ACCESS RIGHT  01/16/2019   POLYPECTOMY  11/16/2020   Procedure: POLYPECTOMY;  Surgeon: Rogene Houston, MD;  Location: [facility];  Service: Endoscopy;;  gastric   TUBAL LIGATION      Assessment & Plan Clinical Impression: Felicia Williams is an 81 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 She was found to be septic due to  peritonitis with lactic acidosis, milky fluid with 8667 WBC and mesothelial cells. She was started on Vanc/Cefepime for treatment of PD associated peritonitis.   BC/peritoneal cultures negative.  Hospital course significant for encephalopathy with lethargy with non verbal state, myoclonus, confusion and hallucinations that started on 02/09. Cefepime changed to ancef without improvement. Dr. Leonel Ramsay consulted for input. MRI brain negative. EEG negative for seizures and showed frequent runs of induced sharp GPDs c/w severe toxic/metabolic encephalopathy.    Confusion gradually resolved with improvement in alertness and verbal output back to baseline. Continues on Meropenum and Vanc to complete 2 week course of antibiotic regimen. Tremors/jerks with activity improving but she continue to be limited by weakness with unsteady gait, fatigue and STM deficits with poor safety awareness. CIR recommended due to functional decline.   .  Patient transferred to CIR on 09/07/2021 .    Patient currently requires supervision to [facility] with basic self-care skills secondary to muscle weakness and decreased standing balance and decreased balance strategies.  Prior to hospitalization, patient was fully independent and driving.   Patient will benefit from skilled intervention to increase independence with basic self-care skills prior to discharge home with care partner.  Anticipate patient will require intermittent supervision and no further OT follow recommended.  OT - End of Session Activity Tolerance: Tolerates 10 - 20 min activity with multiple rests Endurance Deficit: Yes Endurance Deficit Description: pt states her legs get fatigued OT Assessment Rehab Potential (ACUTE  ONLY): Excellent OT Patient demonstrates impairments in the following area(s): Balance;Endurance;Motor OT Basic ADL's Functional Problem(s): Bathing;Dressing;Toileting;Grooming OT Transfers Functional Problem(s): Toilet;Tub/Shower OT Additional  Impairment(s): None OT Plan OT Intensity: Minimum of 1-2 x/day, 45 to 90 minutes OT Frequency: 5 out of 7 days OT Duration/Estimated Length of Stay: 7-8 days OT Treatment/Interventions: Balance/vestibular training;Discharge planning;DME/adaptive equipment instruction;Functional mobility training;Self Care/advanced ADL retraining;Patient/family education;Psychosocial support;Therapeutic Activities;Therapeutic Exercise;UE/LE Strength taining/ROM;UE/LE Coordination activities OT Self Feeding Anticipated Outcome(s): no goal, pt is independent OT Basic Self-Care Anticipated Outcome(s): Mod I OT Toileting Anticipated Outcome(s): Mod I OT Bathroom Transfers Anticipated Outcome(s): Mod I OT Recommendation Recommendations for Other Services: Therapeutic Recreation consult Therapeutic Recreation Interventions: Pet therapy;Other (comment) Patient destination: Home Follow Up Recommendations: None Equipment Recommended: 3 in 1 bedside comode   OT Evaluation Precautions/Restrictions  Precautions Precautions: Fall Restrictions Weight Bearing Restrictions: No  Pain Pain Assessment Pain Score: 0-No pain Home Living/Prior Functioning Home Living Family/patient expects to be discharged to:: Private residence Living Arrangements: Other relatives Available Help at Discharge: Family, Available 24 hours/day Type of Home: House Home Access: Stairs to enter [employer] of Steps: 1 Home Layout: Able to live on main level with bedroom/bathroom, Laundry or work area in basement [employer] Shower/Tub: Chiropodist: Standard  Lives With: Family Prior Function Level of Independence: Independent with basic ADLs, Independent with homemaking with ambulation, Independent with gait, Independent with transfers  Able to Take Stairs?: Yes Driving: Yes Vocation: Retired Leisure: Hobbies-no (only watches TV - would benefit from TR) Vision Baseline Vision/History: 1 Wears glasses  (reading only) Ability to See in Adequate Light: 0 Adequate Patient Visual Report: No change from baseline Vision Assessment?: No apparent visual deficits Perception  Perception: Within Functional Limits Praxis Praxis: Intact Cognition Overall Cognitive Status: Within Functional Limits for tasks assessed Arousal/Alertness: Awake/alert Orientation Level: Person;Place;Situation Person: Oriented Place: Oriented Situation: Oriented Year: 2023 Month: February Day of Week: Correct Memory: Appears intact Immediate Memory Recall: Sock;Blue;Bed Memory Recall Sock: Without Cue Memory Recall Blue: Without Cue Memory Recall Bed: Without Cue Awareness: Appears intact Problem Solving: Appears intact Safety/Judgment: Appears intact Comments: pt states "wow I am thinking slowly" but with ADLs pt did an excellent job, followed directions well Sensation Sensation Light Touch: Appears Intact Hot/Cold: Appears Intact Proprioception: Appears Intact Stereognosis: Appears Intact Coordination Gross Motor Movements are Fluid and Coordinated: No Fine Motor Movements are Fluid and Coordinated: Yes Coordination and Movement Description: LE weakness, pt takes small shuffling steps Motor  Motor Motor - Skilled Clinical Observations: generalized motor weakness  Trunk/Postural Assessment  Thoracic Assessment Thoracic Assessment: Exceptions to [facility] (forward flexion, pt reports she has history of OA and fell on her back last year hitting tailbone) Postural Control Postural Control: Within Functional Limits  Balance Dynamic Sitting Balance Dynamic Sitting - Level of Assistance: 5: Stand by assistance Static Standing Balance Static Standing - Level of Assistance: 5: Stand by assistance Dynamic Standing Balance Dynamic Standing - Level of Assistance: 4: Min assist Extremity/Trunk Assessment RUE Assessment Active Range of Motion (AROM) Comments: WFL General Strength Comments: 4/5 LUE  Assessment Active Range of Motion (AROM) Comments: WFL General Strength Comments: 4/5  Care Tool Care Tool Self Care Eating   Eating Assist Level: Independent    Oral Care    Oral Care Assist Level: Supervision/Verbal cueing    Bathing   Body parts bathed by patient: Right arm;Left arm;Chest;Abdomen;Front perineal area;Buttocks;Right upper leg;Left upper leg;Right lower leg;Left lower leg;Face     Assist Level: Supervision/Verbal cueing  Upper Body Dressing(including orthotics)   What is the patient wearing?: Pull over shirt;Bra   Assist Level: Set up assist    Lower Body Dressing (excluding footwear)   What is the patient wearing?: Underwear/pull up;Pants Assist for lower body dressing: Supervision/Verbal cueing    Putting on/Taking off footwear   What is the patient wearing?: Non-skid slipper socks Assist for footwear: Supervision/Verbal cueing       Care Tool Toileting Toileting activity   Assist for toileting: Supervision/Verbal cueing     Care Tool Bed Mobility Roll left and right activity   Roll left and right assist level: Independent    Sit to lying activity        Lying to sitting on side of bed activity   Lying to sitting on side of bed assist level: the ability to move from lying on the back to sitting on the side of the bed with no back support.: Supervision/Verbal cueing (bed flat, no rails)     Care Tool Transfers Sit to stand transfer   Sit to stand assist level: Supervision/Verbal cueing    Chair/bed transfer   Chair/bed transfer assist level: Contact Guard/Touching assist     Toilet transfer   Assist Level: Contact Guard/Touching assist     Care Tool Cognition  Expression of Ideas and Wants Expression of Ideas and Wants: 4. Without difficulty (complex and basic) - expresses complex messages without difficulty and with speech that is clear and easy to understand  Understanding Verbal and Non-Verbal Content Understanding Verbal and  Non-Verbal Content: 4. Understands (complex and basic) - clear comprehension without cues or repetitions   Memory/Recall Ability Memory/Recall Ability : Current season;Location of own room;Staff names and faces;That he or she is in a hospital/hospital unit   Refer to Care Plan for Long Term Goals  SHORT TERM GOAL WEEK 1 OT Short Term Goal 1 (Week 1): STG = LTGs  Recommendations for other services: Therapeutic Recreation  Pet therapy and Other finding new hobbies    Skilled Therapeutic Intervention ADL ADL Eating: Independent Grooming: Supervision/safety Where Assessed-Grooming: Standing at sink Upper Body Bathing: Setup Lower Body Bathing: Supervision/safety Upper Body Dressing: Setup Lower Body Dressing: Supervision/safety Toileting: Supervision/safety Toilet Transfer: Contact guard Toilet Transfer Method: Ambulating    Pt seen for initial evaluation and ADL training with a focus on activity tolerance and balance. Explained role of OT and discussed pt's goals.  Pt stated that she has always showered with her PD port. Explained that I will clarify orders on Monday, as I was instructed to NOT shower her. Possibly because it is a new port. In the meantime, agreed to just do sponge bathing.  Pt used RW to ambulate to bathroom to complete all of her self care. Cued her to sit to manage clothing over feet versus standing to increase safety.  Pt participated well and only needs light A.  Pt resting in recliner with seat alarm on and all needs met.    Discharge Criteria: Patient will be discharged from OT if patient refuses treatment 3 consecutive times without medical reason, if treatment goals not met, if there is a change in medical status, if patient makes no progress towards goals or if patient is discharged from hospital.  The above assessment, treatment plan, treatment alternatives and goals were discussed and mutually agreed upon: by patient  Rogers 09/08/2021, 11:00 AM

## 2021-09-08 NOTE — Progress Notes (Signed)
[facility] PROGRESS NOTE  Assessment/ Plan: Pt is a 81 y.o. yo female  with ESRD on CCPD x 4 years, HL, HTN, and GERD who was admitted as transfer from [facility] with peritonitis.   CCPD, coordinated through [facility] (Dr. Holley Raring) [phone]  57 kg EDW Last ESA was dosed at home per the HD unit. Mircera is ordered for 50 mcg on 08/10/21. May be able to give again here soon.  [phone] phone number for outpatient PD RN line  # PD associated peritonitis: Culture-negative peritonitis.  Gram stain with gram-positive cocci.  Reportedly the PD fluid was cloudy and the patient received antibiotics before the sample was collected.  Initially the antibiotics started on 2/4 vancomycin and cefepime which was later changed to Ancef because of concern of encephalopathy.  Seen by neurology and there is no improvement in mental status.   The antibiotics changed to meropenem on 2/13 in order to avoid cephalosporins. The plan is to complete total 2 weeks of antibiotics with vancomycin 1 g MWF and meropenem. Last dose on 2/18.  #ESRD on PD: Continue CCPD tonight, all 5 exchanges 2.5%.  Monitor lab.  Discussed with the dialysis nurse.  #Acute metabolic encephalopathy: Concern for cephalosporin related neurotoxicity.  Seen by neurology.   MRI of the brain with no acute finding.  Avoiding cephalosporins and now changed to meropenem.   Mental status greatly improved today.  Appears at baseline  # Anemia: On Mircera as outpatient .  We will continue weekly Aranesp.  Monitor hemoglobin.  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 Monitor lab.  # HTN/volume: BP variable.  On carvedilol.  Volume management by PD.  Subjective: Patient feels great today with no complaints.  Working hard with rehab.  PD going well   Objective Vital signs in last 24 hours: Vitals:   09/07/21 1800 09/07/21 1831 09/08/21 0700  BP:  136/65 (!) 116/55  Pulse:  82 81  Resp:  16 20  Temp:  98.5 F (36.9  C) 97.7 F (36.5 C)  TempSrc:  Oral Axillary  SpO2:   99%  Weight: 58.1 kg    Height: 4\' 11"  (1.499 m)     Weight change:   Intake/Output Summary (Last 24 hours) at 09/08/2021 1021 Last data filed at 09/08/2021 0700 Gross per 24 hour  Intake 9099 ml  Output 1424 ml  Net 7675 ml       Labs: Basic Metabolic Panel: Recent Labs  Lab 09/02/21 0311 09/03/21 0346 09/04/21 0413 09/08/21 0527  NA 138 138 140 136  K 3.7 3.4* 3.5 3.4*  CL 98 97* 99 94*  CO2 24 22 25 25   GLUCOSE 136* 138* 116* 114*  BUN 62* 61* 60* 64*  CREATININE 10.82* 11.10* 11.46* 12.36*  CALCIUM 9.5 9.6 9.6 9.3  PHOS 6.3* 7.1* 6.6*  --    Liver Function Tests: Recent Labs  Lab 09/03/21 0346 09/04/21 0413 09/08/21 0527  AST  --   --  19  ALT  --   --  <5  ALKPHOS  --   --  85  BILITOT  --   --  0.4  PROT  --   --  6.0*  ALBUMIN 2.4* 2.4* 2.3*   No results for input(s): LIPASE, AMYLASE in the last 168 hours. No results for input(s): AMMONIA in the last 168 hours. CBC: Recent Labs  Lab 09/04/21 0413 09/08/21 0527  WBC 10.0 11.8*  NEUTROABS  --  8.5*  HGB 8.9* 9.4*  HCT 27.6*  27.1*  MCV 109.1* 105.0*  PLT 325 285   Cardiac Enzymes: No results for input(s): CKTOTAL, CKMB, CKMBINDEX, TROPONINI in the last 168 hours. CBG: No results for input(s): GLUCAP in the last 168 hours.  Iron Studies: No results for input(s): IRON, TIBC, TRANSFERRIN, FERRITIN in the last 72 hours. Studies/Results: No results found.  Medications: Infusions:  dialysis solution 2.5% low-MG/low-CA     meropenem (MERREM) IV      Scheduled Medications:  (feeding supplement) PROSource Plus  30 mL Oral BID BM   carvedilol  12.5 mg Oral BID WC   Chlorhexidine Gluconate Cloth  6 each Topical Daily   [START ON 09/14/2021] darbepoetin (ARANESP) injection - NON-DIALYSIS  100 mcg Subcutaneous Q Thu-1800   famotidine  20 mg Oral Daily   feeding supplement  237 mL Oral BID BM   gentamicin cream  1 application Topical Daily    heparin  5,000 Units Subcutaneous Q8H   multivitamin  1 tablet Oral QHS   sevelamer carbonate  2.4 g Oral TID WC    have reviewed scheduled and prn medications.  Physical Exam: General: Sitting in bed, no distress Heart: Normal rate Lungs: Bilateral chest rise with no increased work of breathing Abdomen:soft, PD catheter in place Extremities:No leg edema, warm and well perfused Dialysis Access: PD catheter  Felicia Williams Felicia Williams 09/08/2021,10:21 AM  LOS: 1 day

## 2021-09-08 NOTE — Progress Notes (Signed)
Inpatient Rehabilitation  Patient information reviewed and entered into eRehab system by Jersie Beel Deanna Wiater, OTR/L.   Information including medical coding, functional ability and quality indicators will be reviewed and updated through discharge.    

## 2021-09-08 NOTE — Progress Notes (Signed)
Patient ID: Felicia Williams, female   DOB: [birth year], 81 y.o.   MRN: [id number] Met with the patient to introduce self and review rehab process, team conference and plan of care. Reviewed situation and infection prevention when performing PD. Patient noted leak in bag just before infection noted and attributes peritonitis to that but also noted that she does not complete PD the same at the nurses; was not taught the same procedure. Poor appetite; reported since PD with full abd sensation and has lost her taste. Noted limited protein options and was told to avoid choc protein drinks. C/o itching; sarna at bedside helps some. No other issues noted at present. Continue to follow along to discharge to address educational needs. Collaborate with the patient and team to facilitate preparation for discharge home with grand-daughter. Margarito Liner

## 2021-09-08 NOTE — Progress Notes (Signed)
PROGRESS NOTE   Subjective/Complaints:    Objective:   No results found. Recent Labs    09/08/21 0527  WBC 11.8*  HGB 9.4*  HCT 27.1*  PLT 285   Recent Labs    09/08/21 0527  NA 136  K 3.4*  CL 94*  CO2 25  GLUCOSE 114*  BUN 64*  CREATININE 12.36*  CALCIUM 9.3    Intake/Output Summary (Last 24 hours) at 09/08/2021 0858 Last data filed at 09/08/2021 0700 Gross per 24 hour  Intake 9099 ml  Output 1424 ml  Net 7675 ml        Physical Exam: Vital Signs Blood pressure (!) 116/55, pulse 81, temperature 97.7 F (36.5 C), temperature source Axillary, resp. rate 20, height 4\' 11"  (1.499 m), weight 58.1 kg, SpO2 99 %.    Assessment/Plan: 1. Functional deficits which require 3+ hours per day of interdisciplinary therapy in a comprehensive inpatient rehab setting. Physiatrist is providing close team supervision and 24 hour management of active medical problems listed below. Physiatrist and rehab team continue to assess barriers to discharge/monitor patient progress toward functional and medical goals  Care Tool:  Bathing              Bathing assist       Upper Body Dressing/Undressing Upper body dressing        Upper body assist      Lower Body Dressing/Undressing Lower body dressing            Lower body assist       Toileting Toileting    Toileting assist       Transfers Chair/bed transfer  Transfers assist           Locomotion Ambulation   Ambulation assist              Walk 10 feet activity   Assist           Walk 50 feet activity   Assist           Walk 150 feet activity   Assist           Walk 10 feet on uneven surface  activity   Assist           Wheelchair     Assist               Wheelchair 50 feet with 2 turns activity    Assist            Wheelchair 150 feet activity     Assist           Blood pressure (!) 116/55, pulse 81, temperature 97.7 F (36.5 C), temperature source Axillary, resp. rate 20, height 4\' 11"  (1.499 m), weight 58.1 kg, SpO2 99 %.    Medical Problem List and Plan: 1. Functional deficits secondary to Toxic metabolic encephalopathy due to peritonitis associated with PD              -patient may not shower- due to PD catheter             -ELOS/Goals: 10-14 days supervision 2.  Antithrombotics: -DVT/anticoagulation:  Pharmaceutical: Heparin             -  antiplatelet therapy: n/a 3. Pain Management:  N/a 4. Mood: LCSW to follow for evaluation and support.              -antipsychotic agents: N/A 5. Neuropsych: This patient may be intermittently capable of making decisions on her own behalf. 6. Skin/Wound Care: Routine pressure relief measure             --daily care to PD site 7 . Fluids/Electrolytes/Nutrition:  Monitor I/O. Check CMET in am 8. Peritonitis/sepsis: Vancomycin 1 gram MWF with meropenum to continue thorough 02/17 9. Cefepime induced neurotoxicity/encephalopathy: Has cleared but still not back to baseline per daughter.  10. Diarrhea: Related to antibiotics and/or ensure.  -continue prosource BID. Immodium prn  11. ESRD: Continue PD at the end of the day to help with tolerance of activity 12. Anemia of chronic disease: On aranesp weekly.              --continue to monitor H/H with routine checks.  13. Gastroparesis/GERD?: Continue pepcid BID. Continue lomotil bid prn.  --Discontinue protonix due to question SE of diarrhea.  --Was managed without meds at home.  14. HTN: Monitor BP TID--continue coreg bid.  Vitals:   09/07/21 1831 09/08/21 0700  BP: 136/65 (!) 116/55  Pulse: 82 81  Resp: 16 20  Temp: 98.5 F (36.9 C) 97.7 F (36.5 C)  SpO2:  99%    15. Poor appetite- is acute on chronic- might need appetite stimulant  16. Mild HypoK+ defer to renal given her PD status   LOS: 1 days A FACE TO FACE EVALUATION WAS PERFORMED  Charlett Blake 09/08/2021, 8:58 AM

## 2021-09-08 NOTE — Progress Notes (Signed)
Inpatient Rehabilitation Admission Medication Review by a Pharmacist  A complete drug regimen review was completed for this patient to identify any potential clinically significant medication issues.  High Risk Drug Classes Is patient taking? Indication by Medication  Antipsychotic Yes Compazine N/V  Anticoagulant Yes Heparin for DVT px  Antibiotic Yes Meropenem/vanc for peritonitis  Opioid No   Antiplatelet No   Hypoglycemics/insulin No   Vasoactive Medication Yes Coreg - HTN  Chemotherapy No   Other Yes Aranesp - anemia Renvela - phos binder     Type of Medication Issue Identified Description of Issue Recommendation(s)  Drug Interaction(s) (clinically significant)     Duplicate Therapy     Allergy     No Medication Administration End Date     Incorrect Dose     Additional Drug Therapy Needed     Significant med changes from prior encounter (inform family/care partners about these prior to discharge).    Other       Clinically significant medication issues were identified that warrant physician communication and completion of prescribed/recommended actions by midnight of the next day:  No  Name of provider notified for urgent issues identified:   Provider Method of Notification:     Pharmacist comments:   Time spent performing this drug regimen review (minutes):  15

## 2021-09-08 NOTE — Progress Notes (Signed)
Inpatient Rehabilitation Care Coordinator Assessment and Plan Patient Details  Name: Felicia Williams MRN: [id number] Date of Birth: Aug 21, 1940  Today's Date: 09/08/2021  Hospital Problems: Principal Problem:   Debility  Past Medical History:  Past Medical History:  Diagnosis Date   Anemia in stage 4 chronic kidney disease ([place]) 08/18/2018   Coccyx contusion--with chronic pain due to fall    Depression    Essential hypertension, benign    Gastric polyps    Gastroparesis    followed by Dr. Laural Golden.   GERD (gastroesophageal reflux disease)    Glomerulonephritis    Gout    Mixed hyperlipidemia    Renal insufficiency    Spondylosis    Past Surgical History:  Past Surgical History:  Procedure Laterality Date   APPENDECTOMY     CATARACT EXTRACTION     COLONOSCOPY N/A 12/28/2015   Procedure: COLONOSCOPY;  Surgeon: Rogene Houston, MD;  [facility];  Service: Endoscopy;  Laterality: N/A;  815   ESOPHAGOGASTRODUODENOSCOPY N/A 11/16/2020   Procedure: ESOPHAGOGASTRODUODENOSCOPY (EGD);  Surgeon: Rogene Houston, MD;  Location: [facility];  Service: Endoscopy;  Laterality: N/A;  1:15   IR FLUORO GUIDE CV LINE RIGHT  01/16/2019   IR REMOVAL TUN CV CATH W/O FL  07/01/2019   IR US GUIDE VASC ACCESS RIGHT  01/16/2019   POLYPECTOMY  11/16/2020   Procedure: POLYPECTOMY;  Surgeon: Rogene Houston, MD;  [facility]: [facility];  Service: Endoscopy;;  gastric   TUBAL LIGATION     Social History:  reports that she has never smoked. She has never been exposed to tobacco smoke. She has never used smokeless tobacco. She reports that she does not drink alcohol and does not use drugs.  Family / Support Systems Marital Status: Single Spouse/Significant Other: N/A Children: 2 adult children- Pam, lives 5 mins from her home; and son Legrand Como- works 3rd shift and able to help PRN. Lives 10 mi from her home. Other Supports: granddaughter Environmental education officer Anticipated Caregiver: Dtr  pam Ability/Limitations of Caregiver: Pt reports her dtr Pam will help as much as she is able too. States that she already is helping with setting up her PD supplies at night, and she hooks herself to the machine. She states granddaughter Ubaldo Glassing (82 y.o.) will need encouragement to assist with her care needs. Caregiver Availability: Intermittent Family Dynamics: Pt granddaughter Alexis stays in the home with her.  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 Education: high school Health Literacy - How often do you need to have someone help you when you read instructions, pamphlets, or other written material from your doctor or pharmacy?: Never Writes: Yes Employment Status: Retired Date Retired/Disabled/Unemployed: 2011 Age Retired: 68 Public relations account executive Issues: Denies Guardian/Conservator: N/A   Abuse/Neglect Abuse/Neglect Assessment Can Be Completed: Yes Physical Abuse: Denies Verbal Abuse: Denies Sexual Abuse: Denies Exploitation of patient/patient's resources: Denies Self-Neglect: Denies  Patient response to: Social Isolation - How often do you feel lonely or isolated from those around you?: Never  Emotional Status Pt's affect, behavior and adjustment status: Pt in good spirits at time of visit. Pt understands she will need help when she goes home. States that her dtr [name] will help as much as possible. Recent Psychosocial Issues: Denies Psychiatric History: Pt admits to depression at times. Unsure on what medication she takes for it. Substance Abuse History: Denies  Patient / Family Perceptions,  Expectations & Goals Pt/Family understanding of illness & functional limitations: Pt has a general understanding of care needs at d/c. Premorbid pt/family roles/activities: Indepedent, with assistance for PD at night. Anticipated changes in  roles/activities/participation: Assistance with ADLs/IADLs Pt/family expectations/goals: Pt goal is to work on getting "arms and legs stronger."  [employer]: None Premorbid Home Care/DME Agencies: None Transportation available at discharge: TBD Is the patient able to respond to transportation needs?: Yes In the past 12 months, has lack of transportation kept you from medical appointments or from getting medications?: No In the past 12 months, has lack of transportation kept you from meetings, work, or from getting things needed for daily living?: No Resource referrals recommended: Neuropsychology  Discharge Planning Living Arrangements: Alone, Other relatives Support Systems: Children, Other relatives Type of Residence: Private residence Insurance Resources: Multimedia programmer (specify) ([employer] Medicare) Financial Resources: Radio broadcast assistant Screen Referred: No Living Expenses: Own Money Management: Patient Does the patient have any problems obtaining your medications?: No Home Management: Pt managed most of her homecare needs. States her dtr Pam would also help with cooking/cleaning, and granddaughter will help is she is told. Patient/Family Preliminary Plans: TBD Care Coordinator Barriers to Discharge: Decreased caregiver support, Lack of/limited family support, Insurance for SNF coverage, Other (comments) Care Coordinator Barriers to Discharge Comments: Pt is on peritoneal dialysis. Care Coordinator Anticipated Follow Up Needs: HH/OP  Clinical Impression This SW covering for primary SW, Becky Dupree.   SW met with pt in room to introduce self, explain role, and discuss discharge process. Pt is not a English as a second language teacher. Pt states she has an HCPOA, no paperwork on file. Designated parties listed in chart from 2017 as children- Hsiao and Pam. DME: cane, RW, shower seat (uses often), TTB, w/c (never used). Per EMR, pt was provided 3in1 BSC with [facility] SW confirmed with [facility] who confirms referral was accepted. Pt and [facility] aware Jacqlyn Larsen will f/u once more information related to discharge.   Colten Desroches A Amillion Sc[place]ee 09/08/2021, 2:16 PM

## 2021-09-09 NOTE — Progress Notes (Signed)
Physical Therapy Session Note  Patient Details  Name: Felicia Williams MRN: [id number] Date of Birth: 07/23/1941  Today's Date: 09/09/2021 PT Individual Time: 0903-1001 and 7322-0254 PT Individual Time Calculation (min): 58 min and 59 min  Short Term Goals: Week 1:  PT Short Term Goal 1 (Week 1): STG=LTG based on ELOS  Skilled Therapeutic Interventions/Progress Updates:  Session 1: Patient supine in bed on entrance to room. Patient alert and agreeable to PT session. Has just completed dialysis for the morning and is somewhat tired. Pt asks why she can't shower as makes request for shower. Relates that she asked the PD nurse re: showering and nurse made recommendations for changing dressing/ tape over PD port after shower and pt should be okay. Pt also says she has been showering since receiving this abdominal port 2-3 yrs ago. Informed pt that concern is d/t recent peritonitis. Msg sent to MD on duty for weekend.   Patient with no pain complaint throughout session.  Therapeutic Activity: Bed Mobility: Patient performed supine --> sit with supervision/ ModI. Bed features used but no vc required.  Transfers: Patient performed sit<>stand and stand pivot transfers throughout session with supervision/ ModI. Provided verbal cues for maintaining walker position in front of pt prior to descent to sit. While seated EOB, pt is able to maintain balance very well and dons outfit for day with SETUP. Request to toilet, ambulatory transfer to toilet using RW with supervision. Pericare with IND and brief/ pullup change with SETUP.   Gait Training/ NMR:  Patient ambulated short distances in room using RW collecting clothes in order to dress for day. Close supervision provided. Hallway ambulation performed with aspects to dynamic gait in order to assess balance and dual task. Pt with minimal changes in path/ balance with head turns, starts/ stops, stepping over objects. Changes in gait speed noticeable with no  deviation in path.    NMR performed for improvements in motor control and coordination, balance, sequencing, judgement, and self confidence/ efficacy in performing all aspects of mobility at highest level of independence.   Patient seated  in recliner at end of session with brakes locked, seat pad alarm set, and all needs within reach.    Session 2: Patient seated in recliner on entrance to room. Patient alert and agreeable to PT session.   Patient with minimal increase in low back pain during session. Addressed with provision of RW.   Therapeutic Activity: Transfers: Patient performed sit<>stand and stand pivot transfers throughout session with supervision/ Mod I. Provided verbal cues for not discarding RW prior to descent to sit.  Gait Training:  Patient ambulated 10' x2 using no AD with CGA. Demonstrated decreased step length/ height and flexed forward posture d/t low back pain. Reintroduction of RW decreases pain and improves quality of gait. Provided vc/ tc for level gaze, scapular retraction, and chin tuck for improved posture.  Pt guided in 6MWT and is able to cover 440 feet using RW with close supervision.  Therapeutic Exercise: Pt guided in continuous reciprocation of BUE and BLE using NuStep L3 x 44min with focus on maintaining continuous effort throughout for strengthening of musculature and cardiorespiratory systems. Maintains pace between 31-35 steps/ min an 1.5-1.7 METs throughout.   Patient seated upright  in recliner at end of session with brakes locked, seat pad alarm set, and all needs within reach.    Therapy Documentation Precautions:  Precautions Precautions: Fall Precaution Comments: peritoneal dialysis catheter Restrictions Weight Bearing Restrictions: No General:   Vital Signs:  Pain: No pain complaint during first session.  Minimal back pain in second session addressed with repositioning and adjustment of AD.   Therapy/Group: Individual  Therapy  Alger Simons PT, DPT 09/09/2021, 12:41 PM

## 2021-09-09 NOTE — Progress Notes (Signed)
[facility] NEPHROLOGY PROGRESS NOTE  Assessment/ Plan: Pt is a 81 y.o. yo female  with ESRD on CCPD x 4 years, HL, HTN, and GERD who was admitted as transfer from [facility] with peritonitis.   CCPD, coordinated through [facility] (Dr. Holley Raring) [phone]  57 kg EDW Last ESA was dosed at home per the HD unit. Mircera is ordered for 50 mcg on 08/10/21. May be able to give again here soon.  [phone] phone number for outpatient PD RN line  # PD associated peritonitis: Culture-negative peritonitis.  Gram stain with gram-positive cocci.  Reportedly the PD fluid was cloudy and the patient received antibiotics before the sample was collected.  Initially the antibiotics started on 2/4 vancomycin and cefepime which was later changed to Ancef because of concern of encephalopathy.  Seen by neurology and there is no improvement in mental status.   The antibiotics changed to meropenem on 2/13 in order to avoid cephalosporins. The plan is to complete total 2 weeks of antibiotics with vancomycin 1 g MWF and meropenem. Last dose on 2/18 which is today  #ESRD on PD: Continue CCPD tonight, all 5 exchanges 2.5%.  Monitor lab.  Discussed with the dialysis nurse.  #Acute metabolic encephalopathy: Possibly related to antibiotics.  Now back to baseline.   # Anemia: On Mircera as outpatient .  We will continue weekly Aranesp.  Monitor hemoglobin.  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 Monitor lab.  # HTN/volume: BP variable.  On carvedilol.  Volume management by PD.  Subjective: Tired but overall feels well.  PD went well.  No complaints   Objective Vital signs in last 24 hours: Vitals:   09/08/21 0700 09/08/21 1105 09/08/21 1949 09/09/21 0357  BP: (!) 116/55 (!) 127/52 (!) 111/47 (!) 114/48  Pulse: 81 73 76 72  Resp: 20 20 16 16   Temp: 97.7 F (36.5 C) 98.9 F (37.2 C) 98.4 F (36.9 C) 97.8 F (36.6 C)  TempSrc: Axillary  Oral Oral  SpO2: 99% 100% 100% 97%  Weight:    57.2 kg   Height:       Weight change: -0.9 kg  Intake/Output Summary (Last 24 hours) at 09/09/2021 0842 Last data filed at 09/09/2021 0813 Gross per 24 hour  Intake 220 ml  Output 1249 ml  Net -1029 ml       Labs: Basic Metabolic Panel: Recent Labs  Lab 09/03/21 0346 09/04/21 0413 09/08/21 0527  NA 138 140 136  K 3.4* 3.5 3.4*  CL 97* 99 94*  CO2 22 25 25   GLUCOSE 138* 116* 114*  BUN 61* 60* 64*  CREATININE 11.10* 11.46* 12.36*  CALCIUM 9.6 9.6 9.3  PHOS 7.1* 6.6*  --    Liver Function Tests: Recent Labs  Lab 09/03/21 0346 09/04/21 0413 09/08/21 0527  AST  --   --  19  ALT  --   --  <5  ALKPHOS  --   --  85  BILITOT  --   --  0.4  PROT  --   --  6.0*  ALBUMIN 2.4* 2.4* 2.3*   No results for input(s): LIPASE, AMYLASE in the last 168 hours. No results for input(s): AMMONIA in the last 168 hours. CBC: Recent Labs  Lab 09/04/21 0413 09/08/21 0527  WBC 10.0 11.8*  NEUTROABS  --  8.5*  HGB 8.9* 9.4*  HCT 27.6* 27.1*  MCV 109.1* 105.0*  PLT 325 285   Cardiac Enzymes: No results for input(s): CKTOTAL, CKMB, CKMBINDEX, TROPONINI in the  last 168 hours. CBG: No results for input(s): GLUCAP in the last 168 hours.  Iron Studies: No results for input(s): IRON, TIBC, TRANSFERRIN, FERRITIN in the last 72 hours. Studies/Results: No results found.  Medications: Infusions:  dialysis solution 1.5% low-MG/low-CA     dialysis solution 2.5% low-MG/low-CA     dialysis solution 4.25% low-MG/low-CA      Scheduled Medications:  (feeding supplement) PROSource Plus  30 mL Oral BID BM   carvedilol  12.5 mg Oral BID WC   Chlorhexidine Gluconate Cloth  6 each Topical Daily   [START ON 09/14/2021] darbepoetin (ARANESP) injection - NON-DIALYSIS  100 mcg Subcutaneous Q Thu-1800   famotidine  20 mg Oral Daily   feeding supplement  237 mL Oral BID BM   gentamicin cream  1 application Topical Daily   heparin  5,000 Units Subcutaneous Q8H   multivitamin  1 tablet Oral QHS    sevelamer carbonate  2.4 g Oral TID WC    have reviewed scheduled and prn medications.  Physical Exam: General: Sitting in bed, no distress Heart: Normal rate Lungs: Bilateral chest rise with no increased work of breathing Abdomen:soft, PD catheter in place Extremities:No leg edema, warm and well perfused Dialysis Access: PD catheter  Reesa Chew 09/09/2021,8:42 AM  LOS: 2 days

## 2021-09-09 NOTE — Progress Notes (Signed)
Occupational Therapy Session Note  Patient Details  Name: Felicia Williams MRN: [id number] Date of Birth: [birth year]  Today's Date: 09/09/2021 OT Individual Time: 1240-1355 OT Individual Time Calculation (min): 75 min   Short Term Goals: Week 1:  OT Short Term Goal 1 (Week 1): STG = LTGs  Skilled Therapeutic Interventions/Progress Updates:    Pt greeted seated in recliner and agreeable to OT Treatment session. PT requesting to shower today. OT covered IV site with waterproof dressing. Kept dressing on PD site and changed dressing after shower. Pt has been on PD for 4 years and changes her own dressings at home. Pt ambulated throughout session with RW and close supervision. CGA for stepping over small ledge to get into shower. Bathing with set-up A/supervision overall with min cues for safety. Dressing tasks completed sit<>stand from recliner after changing PD site dressing. Addressed standing balance/endurance with standing grooming tasks at the sink with close supervision. Pt tolerated standing for 5 minutes while working on hair and brushing teeth. Pt then reported fatigue and requested to sit. Pt finished doing hair from seated position at the sink. OT trialed rollator walker wth patient. OT adjusted rollator and educated on brakes and how to position rollator safely against a wall to sit and take a break. Pt returned to room and left seated in recliner with alarm on, daughter present and needs met.   Therapy Documentation Precautions:  Precautions Precautions: Fall Precaution Comments: peritoneal dialysis catheter Restrictions Weight Bearing Restrictions: No Pain:  Denies pain   Therapy/Group: Individual Therapy  Valma Cava 09/09/2021, 1:36 PM

## 2021-09-10 DIAGNOSIS — E440 Moderate protein-calorie malnutrition: Secondary | ICD-10-CM | POA: Insufficient documentation

## 2021-09-10 MED ORDER — HYDROXYZINE HCL 10 MG PO TABS
10.0000 mg | ORAL_TABLET | Freq: Three times a day (TID) | ORAL | Status: DC | PRN
  Administered 2021-09-11: 10 mg via ORAL
  Filled 2021-09-10: qty 1

## 2021-09-10 NOTE — Progress Notes (Signed)
[facility] NEPHROLOGY PROGRESS NOTE  Assessment/ Plan: Pt is a 81 y.o. 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   CCPD, coordinated through [facility] (Dr. Holley Raring) [phone]  57 kg EDW Last ESA was dosed at home per the HD unit. Mircera is ordered for 50 mcg on 08/10/21. May be able to give again here soon.  [phone] phone number for outpatient PD RN line  # PD associated peritonitis: Culture-negative peritonitis.  Gram stain with gram-positive cocci.  Reportedly the PD fluid was cloudy and the patient received antibiotics before the sample was collected.  Initially the antibiotics started on 2/4 vancomycin and cefepime which was later changed to Ancef because of concern of encephalopathy.  Seen by neurology and there is no improvement in mental status.   The antibiotics changed to meropenem on 2/13 in order to avoid cephalosporins. The plan is to complete total 2 weeks of antibiotics with vancomycin 1 g MWF and meropenem. Last dose of antibiotics was on 2/18  #ESRD on PD: Continue CCPD tonight, all 5 exchanges 1.5%.  Switching to 1.5% bags from 2.5% bags today because of low blood pressures and diarrhea  #Acute metabolic encephalopathy: Possibly related to antibiotics.  Now back to baseline.   # Anemia: On Mircera as outpatient .  We will continue weekly Aranesp.  Monitor hemoglobin.  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 Monitor lab.  # HTN/volume: Blood pressure lower lately.  Hold carvedilol.  Volume management by PD.  Subjective: Feels okay today.  Having some diarrhea.  Blood pressures on the lower side.   Objective Vital signs in last 24 hours: Vitals:   09/09/21 1552 09/09/21 1738 09/09/21 2006 09/10/21 0402  BP: (!) 110/95 (!) 118/54 (!) 105/47 (!) 96/52  Pulse: 70 73 85 71  Resp: 20 17 16 16   Temp:  (!) 97.5 F (36.4 C) 98.6 F (37 C) 98.1 F (36.7 C)  TempSrc:   Temporal Oral   SpO2: 100% 95% 98% 96%  Weight:  54.5 kg  54 kg  Height:       Weight change: -2.7 kg  Intake/Output Summary (Last 24 hours) at 09/10/2021 0935 Last data filed at 09/10/2021 8546 Gross per 24 hour  Intake 238 ml  Output --  Net 238 ml       Labs: Basic Metabolic Panel: Recent Labs  Lab 09/04/21 0413 09/08/21 0527  NA 140 136  K 3.5 3.4*  CL 99 94*  CO2 25 25  GLUCOSE 116* 114*  BUN 60* 64*  CREATININE 11.46* 12.36*  CALCIUM 9.6 9.3  PHOS 6.6*  --    Liver Function Tests: Recent Labs  Lab 09/04/21 0413 09/08/21 0527  AST  --  19  ALT  --  <5  ALKPHOS  --  85  BILITOT  --  0.4  PROT  --  6.0*  ALBUMIN 2.4* 2.3*   No results for input(s): LIPASE, AMYLASE in the last 168 hours. No results for input(s): AMMONIA in the last 168 hours. CBC: Recent Labs  Lab 09/04/21 0413 09/08/21 0527  WBC 10.0 11.8*  NEUTROABS  --  8.5*  HGB 8.9* 9.4*  HCT 27.6* 27.1*  MCV 109.1* 105.0*  PLT 325 285   Cardiac Enzymes: No results for input(s): CKTOTAL, CKMB, CKMBINDEX, TROPONINI in the last 168 hours. CBG: No results for input(s): GLUCAP in the last 168 hours.  Iron Studies: No results for  input(s): IRON, TIBC, TRANSFERRIN, FERRITIN in the last 72 hours. Studies/Results: No results found.  Medications: Infusions:  dialysis solution 1.5% low-MG/low-CA     dialysis solution 2.5% low-MG/low-CA     dialysis solution 4.25% low-MG/low-CA      Scheduled Medications:  (feeding supplement) PROSource Plus  30 mL Oral BID BM   Chlorhexidine Gluconate Cloth  6 each Topical Daily   [START ON 09/14/2021] darbepoetin (ARANESP) injection - NON-DIALYSIS  100 mcg Subcutaneous Q Thu-1800   famotidine  20 mg Oral Daily   feeding supplement  237 mL Oral BID BM   gentamicin cream  1 application Topical Daily   heparin  5,000 Units Subcutaneous Q8H   multivitamin  1 tablet Oral QHS   sevelamer carbonate  2.4 g Oral TID WC    have reviewed scheduled and prn  medications.  Physical Exam: General: Sitting in bed, no distress Heart: Normal rate Lungs: Bilateral chest rise with no increased work of breathing Abdomen:soft, PD catheter in place Extremities:No leg edema, warm and well perfused Dialysis Access: PD catheter  Reesa Chew 09/10/2021,9:35 AM  LOS: 3 days

## 2021-09-10 NOTE — Progress Notes (Addendum)
PROGRESS NOTE   Subjective/Complaints:  Appreciate renal note  C/o severe itching "all over "starting ~57mo PTA, no help with Sarna lotion or benadryl  Objective:   No results found. Recent Labs    09/08/21 0527  WBC 11.8*  HGB 9.4*  HCT 27.1*  PLT 285    Recent Labs    09/08/21 0527  NA 136  K 3.4*  CL 94*  CO2 25  GLUCOSE 114*  BUN 64*  CREATININE 12.36*  CALCIUM 9.3     Intake/Output Summary (Last 24 hours) at 09/10/2021 1033 Last data filed at 09/10/2021 0814 Gross per 24 hour  Intake 238 ml  Output --  Net 238 ml         Physical Exam: Vital Signs Blood pressure (!) 96/52, pulse 71, temperature 98.1 F (36.7 C), resp. rate 16, height 4\' 11"  (1.499 m), weight 54 kg, SpO2 96 %.   General: No acute distress Mood and affect are appropriate Heart: Regular rate and rhythm no rubs murmurs or extra sounds Lungs: Clear to auscultation, breathing unlabored, no rales or wheezes Abdomen: Positive bowel sounds, soft nontender to palpation, nondistended Extremities: No clubbing, cyanosis, or edema Skin: No evidence of breakdown, no evidence of rash Neurologic: Cranial nerves II through XII intact, motor strength is 5/5 in bilateral deltoid, bicep, tricep, grip, hip flexor, knee extensors, ankle dorsiflexor and plantar flexor  Musculoskeletal: Full range of motion in all 4 extremities. No joint swelling Gait amb with RW supervision > 100'  Assessment/Plan: 1. Functional deficits which require 3+ hours per day of interdisciplinary therapy in a comprehensive inpatient rehab setting. Physiatrist is providing close team supervision and 24 hour management of active medical problems listed below. Physiatrist and rehab team continue to assess barriers to discharge/monitor patient progress toward functional and medical goals  Care Tool:  Bathing    Body parts bathed by patient: Right arm, Left arm, Chest,  Abdomen, Front perineal area, Buttocks, Right upper leg, Left upper leg, Right lower leg, Left lower leg, Face         Bathing assist Assist Level: Supervision/Verbal cueing     Upper Body Dressing/Undressing Upper body dressing   What is the patient wearing?: Pull over shirt, Bra    Upper body assist Assist Level: Set up assist    Lower Body Dressing/Undressing Lower body dressing      What is the patient wearing?: Underwear/pull up, Pants     Lower body assist Assist for lower body dressing: Supervision/Verbal cueing     Toileting Toileting    Toileting assist Assist for toileting: Supervision/Verbal cueing     Transfers Chair/bed transfer  Transfers assist     Chair/bed transfer assist level: Contact Guard/Touching assist     Locomotion Ambulation   Ambulation assist      Assist level: Contact Guard/Touching assist Assistive device: Walker-rolling Max distance: 450   Walk 10 feet activity   Assist     Assist level: Supervision/Verbal cueing Assistive device: Walker-rolling   Walk 50 feet activity   Assist    Assist level: Supervision/Verbal cueing Assistive device: Walker-rolling    Walk 150 feet activity   Assist    Assist  level: Supervision/Verbal cueing Assistive device: Walker-rolling    Walk 10 feet on uneven surface  activity   Assist     Assist level: Contact Guard/Touching assist Assistive device: Walker-rolling   Wheelchair     Assist Is the patient using a wheelchair?: No             Wheelchair 50 feet with 2 turns activity    Assist            Wheelchair 150 feet activity     Assist          Blood pressure (!) 96/52, pulse 71, temperature 98.1 F (36.7 C), resp. rate 16, height 4\' 11"  (1.499 m), weight 54 kg, SpO2 96 %.    Medical Problem List and Plan: 1. Functional deficits secondary to Toxic metabolic encephalopathy due to peritonitis associated with PD               -patient may not shower- due to PD catheter             -ELOS/Goals: 10-14 days supervision 2.  Antithrombotics: -DVT/anticoagulation:  Pharmaceutical: may d/c Heparin given amb distance              -antiplatelet therapy: n/a 3. Pain Management:  N/a 4. Mood: LCSW to follow for evaluation and support.              -antipsychotic agents: N/A 5. Neuropsych: This patient may be intermittently capable of making decisions on her own behalf. 6. Skin/Wound Care: Routine pressure relief measure             --daily care to PD site 7 . Fluids/Electrolytes/Nutrition:  Monitor I/O. Check CMET in am 8. Peritonitis/sepsis: Vancomycin 1 gram MWF with meropenum to continue thorough 02/17 9. Cefepime induced neurotoxicity/encephalopathy: Has cleared but still not back to baseline per daughter.  10. Diarrhea: Related to antibiotics and/or ensure.  -continue prosource BID. Immodium prn  11. ESRD: Continue PD at the end of the day to help with tolerance of activity 12. Anemia of chronic disease: On aranesp weekly.              --continue to monitor H/H with routine checks.  13. Gastroparesis/GERD?: Continue pepcid BID. Continue lomotil bid prn.  --Discontinue protonix due to question SE of diarrhea.  --Was managed without meds at home.  14. HTN: Monitor BP TID--continue coreg bid.  Vitals:   09/09/21 2006 09/10/21 0402  BP: (!) 105/47 (!) 96/52  Pulse: 85 71  Resp: 16 16  Temp: 98.6 F (37 C) 98.1 F (36.7 C)  SpO2: 98% 96%  HR in good range , BP a little soft , will check ortho vitals   15. Poor appetite- is acute on chronic- might need appetite stimulant  16. Mild HypoK+ defer to renal given her PD status  17.  Pruritis associated with ESRD will trial atarax 10mg  Q8 prn LOS: 3 days A FACE TO FACE EVALUATION WAS PERFORMED  Charlett Blake 09/10/2021, 10:33 AM

## 2021-09-10 NOTE — Progress Notes (Signed)
Physical Therapy Session Note  Patient Details  Name: Felicia Williams MRN: [id number] Date of Birth: 11/05/1940  Today's Date: 09/10/2021 PT Individual Time: 1020-1100 PT Individual Time Calculation (min): 40 min   Short Term Goals: Week 1:  PT Short Term Goal 1 (Week 1): STG=LTG based on ELOS  Skilled Therapeutic Interventions/Progress Updates:   Took awhile to arouse from sleep but agreeable to session. Pt denies any concerns related to upcoming d/c (not planned but ELOS is short) and discussed equipment needs. States she was just brought a rollator which is what she has been wanting especially to use when walking to her mailbox (over gravel and across the street) due to fatigue on the trip back. Pt performs bed mobility mod I. Dons socks independently and shoes to prepare for OOB. Using rollator pt performs mobility in hallway at supervision/mod I level with rollator and demonstrates good use of brakes and safety. Introduced HEP for [employer] exercises to address strength and balance. Pt performed 10 reps x 2 sets for BLE including LAQ with 5 second hold, standing hip abduction, standing hamstring curls, mini squats, and standing heel/toe raises. Returned back to room as described above and handoff to NT to assist with toileting needs.    Therapy Documentation Precautions:  Precautions Precautions: Fall Precaution Comments: peritoneal dialysis catheter Restrictions Weight Bearing Restrictions: No  Pain:  Denies pain.      Therapy/Group: Individual Therapy  Canary Brim Ivory Broad, PT, DPT, CBIS  09/10/2021, 12:07 PM

## 2021-09-11 DIAGNOSIS — G929 Unspecified toxic encephalopathy: Secondary | ICD-10-CM | POA: Diagnosis present

## 2021-09-11 LAB — RENAL FUNCTION PANEL
Albumin: 2.4 g/dL — ABNORMAL LOW (ref 3.5–5.0)
Anion gap: 17 — ABNORMAL HIGH (ref 5–15)
BUN: 62 mg/dL — ABNORMAL HIGH (ref 8–23)
CO2: 25 mmol/L (ref 22–32)
Calcium: 9.4 mg/dL (ref 8.9–10.3)
Chloride: 93 mmol/L — ABNORMAL LOW (ref 98–111)
Creatinine, Ser: 11.76 mg/dL — ABNORMAL HIGH (ref 0.44–1.00)
GFR, Estimated: 3 mL/min — ABNORMAL LOW (ref >60)
Glucose, Bld: 97 mg/dL (ref 70–99)
Phosphorus: 9.1 mg/dL — ABNORMAL HIGH (ref 2.5–4.6)
Potassium: 3.7 mmol/L (ref 3.5–5.1)
Sodium: 135 mmol/L (ref 135–145)

## 2021-09-11 LAB — CBC
HCT: 28.2 % — ABNORMAL LOW (ref 36.0–46.0)
Hemoglobin: 9.6 g/dL — ABNORMAL LOW (ref 12.0–15.0)
MCH: 35.8 pg — ABNORMAL HIGH (ref 26.0–34.0)
MCHC: 34 g/dL (ref 30.0–36.0)
MCV: 105.2 fL — ABNORMAL HIGH (ref 80.0–100.0)
Platelets: 339 10*3/uL (ref 150–400)
RBC: 2.68 MIL/uL — ABNORMAL LOW (ref 3.87–5.11)
RDW: 13.7 % (ref 11.5–15.5)
WBC: 9.1 10*3/uL (ref 4.0–10.5)
nRBC: 0 % (ref 0.0–0.2)

## 2021-09-11 LAB — GLUCOSE, CAPILLARY: Glucose-Capillary: 92 mg/dL (ref 70–99)

## 2021-09-11 NOTE — Progress Notes (Signed)
Occupational Therapy Session Note  Patient Details  Name: Felicia Williams MRN: [id number] Date of Birth: 1940/08/28  Today's Date: 09/11/2021 OT Individual Time: 1345-1445 OT Individual Time Calculation (min): 60 min    Short Term Goals: Week 1:  OT Short Term Goal 1 (Week 1): STG = LTGs  Skilled Therapeutic Interventions/Progress Updates:    Pt received resting in bed ready for therapy.  Pt will questions about returning to driving.  Recommended pt wait at least 1 month until her cognitive skills are much stronger and her reaction times are faster.  When she does want to try to return, recommended she practice with her daughter in parking lots first.  Pt used a rollater to ambulate from room to ortho gym on other unit with S.   In gym, she worked on standing at [employer] to do a reaction time test. Her first trial was a 3 sec response time to stimuli, her second trial was 2 seconds.   It should be closer to 1 sec to be considered functional.  Pt did understand her visual scanning and reaction time was slower than normal and why she needs to get more active before returning to driving.  Discussed that once she is actively managing all her medications, finances, etc that would indicate she is more ready to trial driving.  Pt then sat at UBE arm bike to work on UE exercises at resistance 2 for 5 minutes.     Pt then transferred to mat to work on sit to stand skills.  Pt's balance has improved a great deal.  On the walk back to her room she answered her cell phone and was able to push her rollater and talk on the phone at the same time!  She did well with multitasking.  Pt opted to rest in bed.  Pt transferred to bed. Alarm set and all needs met.   Therapy Documentation Precautions:  Precautions Precautions: Fall Precaution Comments: peritoneal dialysis catheter Restrictions Weight Bearing Restrictions: No    Vital Signs: Therapy Vitals Temp: 98.6 F (37 C) Temp Source: Oral Pulse Rate:  74 Resp: 15 BP: (!) 122/47 Patient Position (if appropriate): Sitting Oxygen Therapy SpO2: 98 % O2 Device: Room Air Pain: Pain Assessment Pain Score: 0-No pain ADL: ADL Eating: Independent Grooming: Supervision/safety Where Assessed-Grooming: Standing at sink Upper Body Bathing: Setup Lower Body Bathing: Supervision/safety Upper Body Dressing: Setup Lower Body Dressing: Supervision/safety Toileting: Supervision/safety Toilet Transfer: Contact guard Toilet Transfer Method: Ambulating   Therapy/Group: Individual Therapy  Marimar Suber 09/11/2021, 4:14 PM

## 2021-09-11 NOTE — Progress Notes (Signed)
Inpatient [facility] Individual Statement of Services  Patient Name:  Felicia Williams  Date:  09/11/2021  Welcome to the [facility].  Our goal is to provide you with an individualized program based on your diagnosis and situation, designed to meet your specific needs.  With this comprehensive rehabilitation program, you will be expected to participate in at least 3 hours of rehabilitation therapies Monday-Friday, with modified therapy programming on the weekends.  Your rehabilitation program will include the following services:  Physical Therapy (PT), Occupational Therapy (OT), 24 hour per day rehabilitation nursing, Therapeutic Recreaction (TR), Care Coordinator, Rehabilitation Medicine, Nutrition Services, and Pharmacy Services  Weekly team conferences will be held on Tuesday to discuss your progress.  Your Inpatient Rehabilitation Care Coordinator will talk with you frequently to get your input and to update you on team discussions.  Team conferences with you and your family in attendance may also be held.  Expected length of stay: 5-7 days  Overall anticipated outcome: supervision level  Depending on your progress and recovery, your program may change. Your Inpatient Rehabilitation Care Coordinator will coordinate services and will keep you informed of any changes. Your Inpatient Rehabilitation Care Coordinator's name and contact numbers are listed  below.  The following services may also be recommended but are not provided by the [facility] will be made to provide these services after discharge if needed.  Arrangements include referral to agencies that provide these services.  Your insurance has been verified to be:  [employer]-Medicare Your primary doctor is:  Sharlyne Cai  Pertinent information will be shared with your doctor  and your insurance company.  Inpatient Rehabilitation Care Coordinator:  Ovidio Kin, [place] or Emilia Beck  Information discussed with and copy given to patient by: Elease Hashimoto, 09/11/2021, 8:46 AM

## 2021-09-11 NOTE — Progress Notes (Addendum)
Patient ID: Felicia Williams, female   DOB: 01/06/1941, 81 y.o.   MRN: [id number] Team feels pt will be met her goals by Wednesday. Messaged team and MD to see if medically ready for Wednesday. Will await response from MD.  12:30 PM Met with pt who is on board with discharge on Wednesday and will le there daughter know this information. Aware MD needs to respond. Has all equipment and home health set up on acute with [facility].

## 2021-09-11 NOTE — Progress Notes (Signed)
Pt vomited this am when giving the renvela granules. Refused to take at lunch, may take at dinner depending on how she feels.

## 2021-09-11 NOTE — Progress Notes (Signed)
Occupational Therapy Session Note  Patient Details  Name: Felicia Williams MRN: [id number] Date of Birth: [birth year]  Today's Date: 09/11/2021 OT Individual Time: 0920-1015 OT Individual Time Calculation (min): 55 min    Short Term Goals: Week 1:  OT Short Term Goal 1 (Week 1): STG = LTGs  Skilled Therapeutic Interventions/Progress Updates:  Patient met lying supine in bed asleep. Increased time to awaken. Patient in agreement with OT treatment session. 0/10 pain reported at rest and with activity. Patient reports completing a wash-up this a.m. Supine to EOB and sit to stand from EOB with supervision A. Patient reports recently receiving rollator. Patient unsure of need for AD with mobility in home. Part of session with focus on discussing and simulating use of rollator in prep for safe d/c home. Education provided on safe sit to stand transfers with use of rollator, locking breaks and supporting rollator against a steady surface prior to sitting. Patient retrieved and donned UB/LB clothing from closet without external assist. Oral hygiene completed standing at sink level with supervision A. With questioning, patient reports making small meals for herself at home. Functional mobility to ADL kitchen for education and simulation of safe use of rollator in home. After education patient demonstrated ability to retrieve items from cabinets/fridge/freezer with safe use of rollator and min reminder cues. Patient able to draw diagram of kitchen set-up to ensure rollator would fit in and around home. Session concluded with patient lying in bed with call bell within reach, bed alarm activated and all needs met.   Therapy Documentation Precautions:  Precautions Precautions: Fall Precaution Comments: peritoneal dialysis catheter Restrictions Weight Bearing Restrictions: No General:   Therapy/Group: Individual Therapy  [name] R [place]-Davis 09/11/2021, 7:01 AM

## 2021-09-11 NOTE — Progress Notes (Signed)
Patient ID: Felicia Williams, female   DOB: 1941/07/01, 81 y.o.   MRN: [id number] S:Feels well, no issues with PD. O:BP 110/60 (BP Location: Right Arm)    Pulse 77    Temp (!) 97 F (36.1 C) (Temporal)    Resp 17    Ht 4\' 11"  (1.499 m)    Wt 53.2 kg    SpO2 99%    BMI 23.69 kg/m   Intake/Output Summary (Last 24 hours) at 09/11/2021 1305 Last data filed at 09/11/2021 0940 Gross per 24 hour  Intake 9123 ml  Output 9962 ml  Net -839 ml   Intake/Output: I/O last 3 completed shifts: In: 354 [P.O.:354] Out: 9009 [Other:9009]  Intake/Output this shift:  Total I/O In: 9005 [Other:9005] Out: 9962 [Other:9962] Weight change: -0.4 kg Gen: NAD CVS: RRR Resp: CTA Abd: +BS, soft, NT/ND Ext: no edema  Recent Labs  Lab 09/08/21 0527 09/11/21 0603  NA 136 135  K 3.4* 3.7  CL 94* 93*  CO2 25 25  GLUCOSE 114* 97  BUN 64* 62*  CREATININE 12.36* 11.76*  ALBUMIN 2.3* 2.4*  CALCIUM 9.3 9.4  PHOS  --  9.1*  AST 19  --   ALT <5  --    Liver Function Tests: Recent Labs  Lab 09/08/21 0527 09/11/21 0603  AST 19  --   ALT <5  --   ALKPHOS 85  --   BILITOT 0.4  --   PROT 6.0*  --   ALBUMIN 2.3* 2.4*   No results for input(s): LIPASE, AMYLASE in the last 168 hours. No results for input(s): AMMONIA in the last 168 hours. CBC: Recent Labs  Lab 09/08/21 0527 09/11/21 0603  WBC 11.8* 9.1  NEUTROABS 8.5*  --   HGB 9.4* 9.6*  HCT 27.1* 28.2*  MCV 105.0* 105.2*  PLT 285 339   Cardiac Enzymes: No results for input(s): CKTOTAL, CKMB, CKMBINDEX, TROPONINI in the last 168 hours. CBG: Recent Labs  Lab 09/11/21 0624  GLUCAP 92    Iron Studies: No results for input(s): IRON, TIBC, TRANSFERRIN, FERRITIN in the last 72 hours. Studies/Results: No results found.  (feeding supplement) PROSource Plus  30 mL Oral BID BM   Chlorhexidine Gluconate Cloth  6 each Topical Daily   [START ON 09/14/2021] darbepoetin (ARANESP) injection - NON-DIALYSIS  100 mcg Subcutaneous Q Thu-1800   famotidine   20 mg Oral Daily   feeding supplement  237 mL Oral BID BM   gentamicin cream  1 application Topical Daily   heparin  5,000 Units Subcutaneous Q8H   multivitamin  1 tablet Oral QHS   sevelamer carbonate  2.4 g Oral TID WC    BMET    Component Value Date/Time   NA 135 09/11/2021 0603   K 3.7 09/11/2021 0603   CL 93 (L) 09/11/2021 0603   CO2 25 09/11/2021 0603   GLUCOSE 97 09/11/2021 0603   BUN 62 (H) 09/11/2021 0603   CREATININE 11.76 (H) 09/11/2021 0603   CALCIUM 9.4 09/11/2021 0603   CALCIUM 7.1 (L) 01/10/2019 0632   GFRNONAA 3 (L) 09/11/2021 0603   GFRAA 11 (L) 01/20/2019 0608   CBC    Component Value Date/Time   WBC 9.1 09/11/2021 0603   RBC 2.68 (L) 09/11/2021 0603   HGB 9.6 (L) 09/11/2021 0603   HCT 28.2 (L) 09/11/2021 0603   PLT 339 09/11/2021 0603   MCV 105.2 (H) 09/11/2021 0603   MCH 35.8 (H) 09/11/2021 0603   MCHC 34.0 09/11/2021 0603  RDW 13.7 09/11/2021 0603   LYMPHSABS 1.5 09/08/2021 0527   MONOABS 1.2 (H) 09/08/2021 0527   EOSABS 0.4 09/08/2021 0527   BASOSABS 0.1 09/08/2021 0527   CCPD, coordinated through [facility] (Dr. Holley Raring) [phone]  57 kg EDW Last ESA was dosed at home per the HD unit. Mircera is ordered for 50 mcg on 08/10/21  Assessment/Plan:  Peritonitis - culture negative.  Gram stain with GPC and had cloudy fluid and abdominal pain.  Treated with IV antibiotics (initiated before sample was collected).  Currently on Ancef (out of concern of encephalopathy with cefepime).  Then changed to meropenem due to no improvement with cephalosporins.  Completed 2 weeks of abx on 09/09/21. ESRD - continue with CCPD.  5 exchanges all with 1.5%. Acute metabolic encephalopathy - likely due to cephalosporins.  Now back at baseline. Anemia of ESRD - currently on weekly aranesp. 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  Carvedilol on hold due to hypotension. Deconditioning - continue with CIR  [name], MD [facility] [phone]

## 2021-09-11 NOTE — Progress Notes (Signed)
PROGRESS NOTE   Subjective/Complaints: Bad itching.  Pt reports sarna and benadryl don't help- benadryl wakes her up- Also notes has chronic B/L anterior shoulder pain.   LBM this AM- had diarrhea for 7-8 days- but now only going 1x/day- loose stool. Getting better.   Sometimes, also coughing.   ROS: Pt denies SOB, abd pain, CP, N/V/C/D, and vision changes  Objective:   No results found. Recent Labs    09/11/21 0603  WBC 9.1  HGB 9.6*  HCT 28.2*  PLT 339   Recent Labs    09/11/21 0603  NA 135  K 3.7  CL 93*  CO2 25  GLUCOSE 97  BUN 62*  CREATININE 11.76*  CALCIUM 9.4    Intake/Output Summary (Last 24 hours) at 09/11/2021 0852 Last data filed at 09/11/2021 0714 Gross per 24 hour  Intake 9241 ml  Output 9962 ml  Net -721 ml        Physical Exam: Vital Signs Blood pressure 110/60, pulse 77, temperature (!) 97 F (36.1 C), temperature source Temporal, resp. rate 17, height 4\' 11"  (1.499 m), weight 53.2 kg, SpO2 99 %.    General: awake, alert, appropriate, NAD HENT: conjugate gaze; oropharynx moist CV: regular rate; no JVD Pulmonary: CTA B/L; no W/R/R- good air movement GI: soft, NT, ND, (+)BS Psychiatric: appropriate Neurological: Ox3 MS: B/L biceps tendinitis pain with TTP over biceps tendon insertion Extremities: No clubbing, cyanosis, or edema Skin: No evidence of breakdown, no evidence of rash Neurologic: Cranial nerves II through XII intact, motor strength is 5/5 in bilateral deltoid, bicep, tricep, grip, hip flexor, knee extensors, ankle dorsiflexor and plantar flexor  Musculoskeletal: Full range of motion in all 4 extremities. No joint swelling Gait amb with RW supervision > 100'  Assessment/Plan: 1. Functional deficits which require 3+ hours per day of interdisciplinary therapy in a comprehensive inpatient rehab setting. Physiatrist is providing close team supervision and 24 hour  management of active medical problems listed below. Physiatrist and rehab team continue to assess barriers to discharge/monitor patient progress toward functional and medical goals  Care Tool:  Bathing    Body parts bathed by patient: Right arm, Left arm, Chest, Abdomen, Front perineal area, Buttocks, Right upper leg, Left upper leg, Right lower leg, Left lower leg, Face         Bathing assist Assist Level: Supervision/Verbal cueing     Upper Body Dressing/Undressing Upper body dressing   What is the patient wearing?: Pull over shirt, Bra    Upper body assist Assist Level: Set up assist    Lower Body Dressing/Undressing Lower body dressing      What is the patient wearing?: Underwear/pull up, Pants     Lower body assist Assist for lower body dressing: Supervision/Verbal cueing     Toileting Toileting    Toileting assist Assist for toileting: Supervision/Verbal cueing     Transfers Chair/bed transfer  Transfers assist     Chair/bed transfer assist level: Supervision/Verbal cueing     Locomotion Ambulation   Ambulation assist      Assist level: Supervision/Verbal cueing Assistive device: Rollator Max distance: 150'   Walk 10 feet activity   Assist  Assist level: Supervision/Verbal cueing Assistive device: Rollator   Walk 50 feet activity   Assist    Assist level: Supervision/Verbal cueing Assistive device: Rollator    Walk 150 feet activity   Assist    Assist level: Supervision/Verbal cueing Assistive device: Rollator    Walk 10 feet on uneven surface  activity   Assist     Assist level: Contact Guard/Touching assist Assistive device: Walker-rolling   Wheelchair     Assist Is the patient using a wheelchair?: No             Wheelchair 50 feet with 2 turns activity    Assist            Wheelchair 150 feet activity     Assist          Blood pressure 110/60, pulse 77, temperature (!) 97 F  (36.1 C), temperature source Temporal, resp. rate 17, height 4\' 11"  (1.499 m), weight 53.2 kg, SpO2 99 %.    Medical Problem List and Plan: 1. Functional deficits secondary to Toxic metabolic encephalopathy due to peritonitis associated with PD              -patient may not shower- due to PD catheter             -ELOS/Goals: 10-14 days supervision  Con't CIR- PT, OT and SLP 2.  Antithrombotics: -DVT/anticoagulation:  Pharmaceutical: may d/c Heparin given amb distance              -antiplatelet therapy: n/a 3. Pain Management:  N/a 4. Mood: LCSW to follow for evaluation and support.              -antipsychotic agents: N/A 5. Neuropsych: This patient may be intermittently capable of making decisions on her own behalf. 6. Skin/Wound Care: Routine pressure relief measure             --daily care to PD site 7 . Fluids/Electrolytes/Nutrition:  Monitor I/O. Check CMET in am 8. Peritonitis/sepsis: Vancomycin 1 gram MWF with meropenum to continue thorough 02/17 9. Cefepime induced neurotoxicity/encephalopathy: Has cleared but still not back to baseline per daughter.  10. Diarrhea: Related to antibiotics and/or ensure.  -continue prosource BID. Immodium prn  11. ESRD: Continue PD at the end of the day to help with tolerance of activity 12. Anemia of chronic disease: On aranesp weekly.              --continue to monitor H/H with routine checks. 2/20- Hb up to 9.6- con't to monitor  13. Gastroparesis/GERD?: Continue pepcid BID. Continue lomotil bid prn.  --Discontinue protonix due to question SE of diarrhea.  --Was managed without meds at home.  14. HTN: Monitor BP TID--continue coreg bid.  Vitals:   09/11/21 0515 09/11/21 0704  BP: (!) 137/53 110/60  Pulse: 71 77  Resp: 18 17  Temp: 98.4 F (36.9 C) (!) 97 F (36.1 C)  SpO2: 99% 99%   2/20- BP controlled- con't regimen 15. Poor appetite- is acute on chronic- might need appetite stimulant  16. Mild HypoK+ defer to renal given her PD  status   2/20- K+ 3.5-  17.  Pruritis associated with ESRD will trial atarax 10mg  Q8 prn  2/20- went well- con't Atarax prn for itching- slept better 18. Loose stools  2/20- has been severe but improving to 1x/day- con't regimen and monitoring.  19. Phosphorus elevated  2/20- Phos up to 9.1- from 6.6- will d/w renal    I spent a  total of  35  minutes on total care today- >50% coordination of care- due to d/w renal and independent review of notes and labs.     LOS: 4 days A FACE TO FACE EVALUATION WAS PERFORMED  Felicia Williams 09/11/2021, 8:52 AM

## 2021-09-11 NOTE — Discharge Instructions (Addendum)
Inpatient Rehab Discharge Instructions  Felicia Williams Discharge date and time:  09/13/21  Activities/Precautions/ Functional Status: Activity: no lifting, driving, or strenuous exercise till cleared by MD Diet:  soft foods.  Wound Care: keep wound clean and dry   Functional status:  ___ No restrictions     ___ Walk up steps independently ___ 24/7 supervision/assistance   ___ Walk up steps with assistance ___ Intermittent supervision/assistance  ___ Bathe/dress independently ___ Walk with walker     ___ Bathe/dress with assistance ___ Walk Independently    ___ Shower independently ___ Walk with assistance    ___ Shower with assistance ___ No alcohol     ___ Return to work/school ________  Special Instructions:    My questions have been answered and I understand these instructions. I will adhere to these goals and the provided educational materials after my discharge from the hospital.  Patient/Caregiver Signature _______________________________ Date __________  Clinician Signature _______________________________________ Date __________  Please bring this form and your medication list with you to all your follow-up doctor's appointments.

## 2021-09-11 NOTE — Progress Notes (Signed)
Physical Therapy Session Note  Patient Details  Name: Felicia Williams MRN: [id number] Date of Birth: 1940/11/24  Today's Date: 09/11/2021 PT Individual Time: 1100-1155 PT Individual Time Calculation (min): 55 min   Short Term Goals: Week 1:  PT Short Term Goal 1 (Week 1): STG=LTG based on ELOS  Skilled Therapeutic Interventions/Progress Updates:    Patient received asleep in bed, easy to wake and agreeable to PT. She denies pain, but endorses fatigue due to meds from last night. Patient ambulating to 4th floor dayroom with rollator and supervision. PT discussing potential dc dates with patient. Patient reporting that she feels near or at [facility] for dc and feels as though she has adequate support at home. PT to message treatment team regarding dc date soon. Patient completed 10 mins on Nustep with B UE and LE. Patient completing the following standing therex with 2.5# ankle weights: hip abd, marches, HSC, hip extension 3x10 with rest breaks between. Patient ambulating back to her room. PT providing patient with HEP. Bed alarm on, call light within reach.   Therapy Documentation Precautions:  Precautions Precautions: Fall Precaution Comments: peritoneal dialysis catheter Restrictions Weight Bearing Restrictions: No    Therapy/Group: Individual Therapy  Karoline Caldwell, PT, DPT, CBIS  09/11/2021, 7:37 AM

## 2021-09-12 MED ORDER — LOPERAMIDE HCL 2 MG PO CAPS: 2.0000 mg | ORAL_CAPSULE | ORAL | 0 refills | Status: DC | PRN

## 2021-09-12 MED ORDER — SEVELAMER CARBONATE 2.4 G PO PACK: 2.4000 g | PACK | Freq: Three times a day (TID) | ORAL | 0 refills | Status: DC

## 2021-09-12 MED ORDER — TRAZODONE HCL 50 MG PO TABS: 25.0000 mg | ORAL_TABLET | Freq: Every evening | ORAL | 0 refills | Status: DC | PRN

## 2021-09-12 MED ORDER — ACETAMINOPHEN 325 MG PO TABS: 650.0000 mg | ORAL_TABLET | Freq: Four times a day (QID) | ORAL | Status: DC | PRN

## 2021-09-12 MED ORDER — HYDROXYZINE HCL 10 MG PO TABS: 10.0000 mg | ORAL_TABLET | Freq: Three times a day (TID) | ORAL | 0 refills | Status: DC | PRN

## 2021-09-12 MED ORDER — CAMPHOR-MENTHOL 0.5-0.5 % EX LOTN: TOPICAL_LOTION | CUTANEOUS | 0 refills | Status: DC | PRN

## 2021-09-12 MED ORDER — DELFLEX-LC/1.5% DEXTROSE 344 MOSM/L IP SOLN
INTRAPERITONEAL | Status: DC
  Administered 2021-09-12: 5000 mL via INTRAPERITONEAL

## 2021-09-12 NOTE — Progress Notes (Signed)
Occupational Therapy Session Note  Patient Details  Name: Felicia Williams MRN: [id number] Date of Birth: [birth year]  Today's Date: 09/12/2021 OT Group Time: 0109-3235 OT Group Time Calculation (min): 60 min   Short Term Goals: Week 1:  OT Short Term Goal 1 (Week 1): STG = LTGs  Skilled Therapeutic Interventions/Progress Updates:  Pt completed group session with a focus on social interaction, increasing activity tolerance, BUE strength/ endurance, and  w/c mobility. First pt engaged in group activity of corn hole where pts were paired into two teams to engage in modified corn hole game to work on w/c mobilty, actiivty tolerance, and UB coordination and strength. Pt completed turns from standing with no UE support with pt even able to collect bean bags after tossing them to board with no LOB.  Next, pt completed  seated UB therapeutic activity where pt was instructed to roll large dice when it was pts turn. Each number rolled correlated with UB therex/ number of repetitions listed on board. UB therex included flys,chest presses, punches, bicep curls, upward rows, over heard presses with a range of reps from 10-20.  Pt used LUE only d/t R hemi.Education provided on importance of determining appropriate modifications that benefited each pt  in order to meet pts specific needs. Pt actively participating in group conversations and encouraging other group members during therapeutic activity. Pt jovial during session noted to sing and dance along with music and engage in all group activities.   Ended with 2 mins of guided deep breathing where pts were instructed on sequence of deep breathing and benefits of deep breathing to accommodate for stress, anxiety and relaxation. Pt ambulated back to room with supervision.   Therapy Documentation Precautions:  Precautions Precautions: Fall Precaution Comments: peritoneal dialysis catheter Restrictions Weight Bearing Restrictions: No  Pain: no pain reported  during session     Therapy/Group: Group Therapy  [name] 09/12/2021, 4:08 PM

## 2021-09-12 NOTE — Plan of Care (Signed)
Problem: RH Balance °Goal: LTG Patient will maintain dynamic sitting balance (PT) °Description: LTG:  Patient will maintain dynamic sitting balance with assistance during mobility activities (PT) °Outcome: Completed/Met °Goal: LTG Patient will maintain dynamic standing balance (PT) °Description: LTG:  Patient will maintain dynamic standing balance with assistance during mobility activities (PT) °Outcome: Completed/Met °  °Problem: Sit to Stand °Goal: LTG:  Patient will perform sit to stand with assistance level (PT) °Description: LTG:  Patient will perform sit to stand with assistance level (PT) °Outcome: Completed/Met °  °Problem: RH Bed Mobility °Goal: LTG Patient will perform bed mobility with assist (PT) °Description: LTG: Patient will perform bed mobility with assistance, with/without cues (PT). °Outcome: Completed/Met °  °Problem: RH Bed to Chair Transfers °Goal: LTG Patient will perform bed/chair transfers w/assist (PT) °Description: LTG: Patient will perform bed to chair transfers with assistance (PT). °Outcome: Completed/Met °  °Problem: RH Car Transfers °Goal: LTG Patient will perform car transfers with assist (PT) °Description: LTG: Patient will perform car transfers with assistance (PT). °Outcome: Completed/Met °  °Problem: RH Ambulation °Goal: LTG Patient will ambulate in controlled environment (PT) °Description: LTG: Patient will ambulate in a controlled environment, # of feet with assistance (PT). °Outcome: Completed/Met °Goal: LTG Patient will ambulate in home environment (PT) °Description: LTG: Patient will ambulate in home environment, # of feet with assistance (PT). °Outcome: Completed/Met °  °Problem: RH Stairs °Goal: LTG Patient will ambulate up and down stairs w/assist (PT) °Description: LTG: Patient will ambulate up and down # of stairs with assistance (PT) °Outcome: Completed/Met °  °

## 2021-09-12 NOTE — Evaluation (Signed)
Recreational Therapy Assessment and Plan  Patient Details  Name: Felicia Williams MRN: [id number] Date of Birth: 1940-10-16 Today's Date: 09/12/2021  Rehab Potential:  Good ELOS:   2/22  Assessment Hospital Problem: Principal Problem:   Debility     Past Medical History:      Past Medical History:  Diagnosis Date   Anemia in stage 4 chronic kidney disease ([employer]) 08/18/2018   Coccyx contusion--with chronic pain due to fall     Depression     Essential hypertension, benign     Gastric polyps     Gastroparesis      followed by Dr. Laural Golden.   GERD (gastroesophageal reflux disease)     Glomerulonephritis     Gout     Mixed hyperlipidemia     Renal insufficiency     Spondylosis      Past Surgical History:       Past Surgical History:  Procedure Laterality Date   APPENDECTOMY       CATARACT EXTRACTION       COLONOSCOPY N/A 12/28/2015    Procedure: COLONOSCOPY;  Surgeon: Rogene Houston, MD;  Location: [facility];  Service: Endoscopy;  Laterality: N/A;  815   ESOPHAGOGASTRODUODENOSCOPY N/A 11/16/2020    Procedure: ESOPHAGOGASTRODUODENOSCOPY (EGD);  Surgeon: Rogene Houston, MD;  Location: [facility];  Service: Endoscopy;  Laterality: N/A;  1:15   IR FLUORO GUIDE CV LINE RIGHT   01/16/2019   IR REMOVAL TUN CV CATH W/O FL   07/01/2019   IR US GUIDE VASC ACCESS RIGHT   01/16/2019   POLYPECTOMY   11/16/2020    Procedure: POLYPECTOMY;  Surgeon: Rogene Houston, MD;  Location: [facility];  Service: Endoscopy;;  gastric   TUBAL LIGATION          Assessment & Plan Clinical Impression: Patient is a 81 y.o. 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 She was found to be septic due to peritonitis with lactic acidosis, milky fluid with 8667 WBC and mesothelial cells. She was started on Vanc/Cefepime for  treatment of PD associated peritonitis.   BC/peritoneal cultures negative.  Hospital course significant for encephalopathy with lethargy with non verbal state, myoclonus, confusion and hallucinations that started on 02/09. Cefepime changed to ancef without improvement. Dr. Leonel Ramsay consulted for input. MRI brain negative. EEG negative for seizures and showed frequent runs of induced sharp GPDs c/w severe toxic/metabolic encephalopathy.    Confusion gradually resolved with improvement in alertness and verbal output back to baseline. Continues on Meropenum and Vanc to complete 2 week course of antibiotic regimen. Tremors/jerks with activity improving but she continue to be limited by weakness with unsteady gait, fatigue and STM deficits with poor safety awareness. CIR recommended due to functional decline.    Pt presents with decreased activity tolerance, decreased functional mobility, decreased balance Limiting pt's independence with leisure/community pursuits.  Pt participated in TAG group with an emphasis on socialization, activity tolerance, dynamic standing balance & safety awareness at supervision-contact guard assist.  Activities included corn hole standing without UE support.  Pt also retrieving bean bags from the floor after play standing with 1 UE support on rollator.  Transitioned to dice game where pt rolled the dice and performed UE exercise corresponding with the number rolled.  PT easily engage in conversations with other participants  and encouraging them.  Therapeutic use of music also included in this session. Plan  No further TR as pt is d/c tomorrow  Recommendations for other services: None   Discharge Criteria: Patient will be discharged from TR if patient refuses treatment 3 consecutive times without medical reason.  If treatment goals not met, if there is a change in medical status, if patient makes no progress towards goals or if patient is discharged from hospital.  The above  assessment, treatment plan, treatment alternatives and goals were discussed and mutually agreed upon: by patient  [place] 09/12/2021, 8:52 AM

## 2021-09-12 NOTE — Progress Notes (Addendum)
Patient ID: Felicia Williams, female   DOB: 18-May-1941, 81 y.o.   MRN: [id number] S: no c/o   O:BP (!) 120/55 (BP Location: Right Arm)    Pulse 66    Temp (!) 97.5 F (36.4 C) (Oral)    Resp 18    Ht 4\' 11"  (1.499 m)    Wt 53.8 kg    SpO2 100%    BMI 23.96 kg/m   Intake/Output Summary (Last 24 hours) at 09/12/2021 1319 Last data filed at 09/12/2021 6378 Gross per 24 hour  Intake 240 ml  Output 1047 ml  Net -807 ml    Intake/Output: I/O last 3 completed shifts: In: 9005 [Other:9005] Out: 11009 [Other:11009]  Intake/Output this shift:  Total I/O In: 240 [P.O.:240] Out: -  Weight change: -0.9 kg Gen: NAD CVS: RRR Resp: CTA Abd: +BS, soft, NT/ND Ext: no edema   CCPD, coordinated through [facility] (Dr. Holley Raring) [phone]  57 kg EDW Last ESA was dosed at home per the HD unit. Mircera is ordered for 50 mcg on 08/10/21     Date  TNC   Gram stain  Culture    08/26/21 8667, 94% pmn +GPC, +wbc's  No growth     08/30/21 11, 18% pmn    Assessment/Plan: Peritonitis - presented on 2/4 w/ abd pain, acute onset. Eval at [facility] showed WBC 14k, blood cx's taken and was given IV vanc/ Rocephin then started on Cefepime. Pt was transferred for [facility] for admissions because [facility] does not do PD. PD fluid sample 2/4 showed +GPC and high TNC 8667 cells both suggesting peritonitis. The culture showed no growth, but was done after antibiotics started. Cefepime caused AMS and was changed to to Ancef IV on 2/08. After 5 days IV ancef pt was not felt to be helping and abx were then changed to meropenem for the final 5 days. So total 2 weeks of abx were completed on 09/09/21. The f/u cell count from 2/8 was normal, showing response to abx. Pt's fluid has been grossly clear since off abx. She has some mild abd soreness today, but her fluid was grossly clear coming off this am. Would not pursue this further. OK for dc home today.  ESRD - continue with [facility]CPD.  5 exchanges all with 1.5%. Acute metabolic encephalopathy -  likely due to cephalosporins, resolved Anemia of ESRD - currently on weekly aranesp. 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  Carvedilol on hold due to hypotension. Deconditioning - sp CIR Dispo - for dc today  Kelly Splinter, MD 09/13/2021, 10:35 AM      Recent Labs  Lab 09/08/21 0527 09/11/21 0603  NA 136 135  K 3.4* 3.7  CL 94* 93*  CO2 25 25  GLUCOSE 114* 97  BUN 64* 62*  CREATININE 12.36* 11.76*  ALBUMIN 2.3* 2.4*  CALCIUM 9.3 9.4  PHOS  --  9.1*  AST 19  --   ALT <5  --     Liver Function Tests: Recent Labs  Lab 09/08/21 0527 09/11/21 0603  AST 19  --   ALT <5  --   ALKPHOS 85  --   BILITOT 0.4  --   PROT 6.0*  --   ALBUMIN 2.3* 2.4*    No results for input(s): LIPASE, AMYLASE in the last 168 hours. No results for input(s): AMMONIA in the last 168 hours. CBC: Recent Labs  Lab 09/08/21 0527 09/11/21 0603  WBC 11.8* 9.1  NEUTROABS 8.5*  --   HGB  9.4* 9.6*  HCT 27.1* 28.2*  MCV 105.0* 105.2*  PLT 285 339     Studies/Results: No results found.  (feeding supplement) PROSource Plus  30 mL Oral BID BM   Chlorhexidine Gluconate Cloth  6 each Topical Daily   [START ON 09/14/2021] darbepoetin (ARANESP) injection - NON-DIALYSIS  100 mcg Subcutaneous Q Thu-1800   famotidine  20 mg Oral Daily   feeding supplement  237 mL Oral BID BM   gentamicin cream  1 application Topical Daily   heparin  5,000 Units Subcutaneous Q8H   multivitamin  1 tablet Oral QHS   sevelamer carbonate  2.4 g Oral TID WC

## 2021-09-12 NOTE — Progress Notes (Signed)
PROGRESS NOTE   Subjective/Complaints: Pt reports L shoulder pain which is chronic this Am and abd pain-soreness due to shots.  LBM 2 days ago.  Thinks she's leaving tomorrow per her discussions with therapy- will see what is discussed in team conference.   ROS:  Pt denies SOB, abd pain, CP, N/V/C/D, and vision changes  Objective:   No results found. Recent Labs    09/11/21 0603  WBC 9.1  HGB 9.6*  HCT 28.2*  PLT 339   Recent Labs    09/11/21 0603  NA 135  K 3.7  CL 93*  CO2 25  GLUCOSE 97  BUN 62*  CREATININE 11.76*  CALCIUM 9.4    Intake/Output Summary (Last 24 hours) at 09/12/2021 0855 Last data filed at 09/12/2021 6568 Gross per 24 hour  Intake 240 ml  Output 1047 ml  Net -807 ml        Physical Exam: Vital Signs Blood pressure (!) 119/53, pulse 69, temperature 98.1 F (36.7 C), temperature source Oral, resp. rate 16, height 4\' 11"  (1.499 m), weight 53.8 kg, SpO2 99 %.     General: awake, alert, appropriate, laying in bed; done with PD; NAD HENT: conjugate gaze; oropharynx moist CV: regular rate; no JVD Pulmonary: CTA B/L; no W/R/R- good air movement GI: soft, NT, ND, (+)BS- protuberant, not distended- multiple bruises in different stages of healing from heparin.  Psychiatric: appropriate- interactive Neurological: Ox3  MS: B/L biceps tendinitis pain with TTP over biceps tendon insertion Extremities: No clubbing, cyanosis, or edema Skin: No evidence of breakdown, no evidence of rash Neurologic: Cranial nerves II through XII intact, motor strength is 5/5 in bilateral deltoid, bicep, tricep, grip, hip flexor, knee extensors, ankle dorsiflexor and plantar flexor  Musculoskeletal: Full range of motion in all 4 extremities. No joint swelling Gait amb with RW supervision > 100'  Assessment/Plan: 1. Functional deficits which require 3+ hours per day of interdisciplinary therapy in a  comprehensive inpatient rehab setting. Physiatrist is providing close team supervision and 24 hour management of active medical problems listed below. Physiatrist and rehab team continue to assess barriers to discharge/monitor patient progress toward functional and medical goals  Care Tool:  Bathing    Body parts bathed by patient: Right arm, Left arm, Chest, Abdomen, Front perineal area, Buttocks, Right upper leg, Left upper leg, Right lower leg, Left lower leg, Face         Bathing assist Assist Level: Supervision/Verbal cueing     Upper Body Dressing/Undressing Upper body dressing   What is the patient wearing?: Pull over shirt, Bra    Upper body assist Assist Level: Set up assist    Lower Body Dressing/Undressing Lower body dressing      What is the patient wearing?: Underwear/pull up, Pants     Lower body assist Assist for lower body dressing: Supervision/Verbal cueing     Toileting Toileting    Toileting assist Assist for toileting: Supervision/Verbal cueing     Transfers Chair/bed transfer  Transfers assist     Chair/bed transfer assist level: Supervision/Verbal cueing     Locomotion Ambulation   Ambulation assist      Assist level: Supervision/Verbal cueing  Assistive device: Rollator Max distance: 150'   Walk 10 feet activity   Assist     Assist level: Supervision/Verbal cueing Assistive device: Rollator   Walk 50 feet activity   Assist    Assist level: Supervision/Verbal cueing Assistive device: Rollator    Walk 150 feet activity   Assist    Assist level: Supervision/Verbal cueing Assistive device: Rollator    Walk 10 feet on uneven surface  activity   Assist     Assist level: Contact Guard/Touching assist Assistive device: Walker-rolling   Wheelchair     Assist Is the patient using a wheelchair?: No             Wheelchair 50 feet with 2 turns activity    Assist            Wheelchair 150  feet activity     Assist          Blood pressure (!) 119/53, pulse 69, temperature 98.1 F (36.7 C), temperature source Oral, resp. rate 16, height 4\' 11"  (1.499 m), weight 53.8 kg, SpO2 99 %.    Medical Problem List and Plan: 1. Functional deficits secondary to Toxic metabolic encephalopathy due to peritonitis associated with PD              -patient may not shower- due to PD catheter             -ELOS/Goals: 10-14 days supervision  Continue CIR- PT, OT and SLP  Team conference today to determine length of stay 2.  Antithrombotics: -DVT/anticoagulation:  Pharmaceutical: may d/c Heparin given amb distance  2/21- still on SQ heparin- if going home tomorrow- will d/c at d/c. Will see how long walking             -antiplatelet therapy: n/a 3. Pain Management:  N/a 4. Mood: LCSW to follow for evaluation and support.              -antipsychotic agents: N/A 5. Neuropsych: This patient may be intermittently capable of making decisions on her own behalf. 6. Skin/Wound Care: Routine pressure relief measure             --daily care to PD site 7 . Fluids/Electrolytes/Nutrition:  Monitor I/O. Check CMET in am 8. Peritonitis/sepsis: Vancomycin 1 gram MWF with meropenum to continue thorough 02/17 9. Cefepime induced neurotoxicity/encephalopathy: Has cleared but still not back to baseline per daughter.  10. Diarrhea: Related to antibiotics and/or ensure.  -continue prosource BID. Immodium prn  11. ESRD: Continue PD at the end of the day to help with tolerance of activity 12. Anemia of chronic disease: On aranesp weekly.              --continue to monitor H/H with routine checks. 2/20- Hb up to 9.6- con't to monitor  13. Gastroparesis/GERD?: Continue pepcid BID. Continue lomotil bid prn.  --Discontinue protonix due to question SE of diarrhea.  --Was managed without meds at home. 2/21- having 1 BM /day usually- LBM 2 days ago- but still loose per pt- con't reigmen  14. HTN: Monitor BP  TID--continue coreg bid.  Vitals:   09/12/21 0517 09/12/21 0651  BP: (!) 128/54 (!) 119/53  Pulse: 70 69  Resp: 16   Temp: 97.6 F (36.4 C) 98.1 F (36.7 C)  SpO2: 96% 99%   2/20- BP controlled- con't regimen 15. Poor appetite- is acute on chronic- might need appetite stimulant  16. Mild HypoK+ defer to renal given her PD status   2/20-  K+ 3.5-  17.  Pruritis associated with ESRD will trial atarax 10mg  Q8 prn  2/20- went well- con't Atarax prn for itching- slept better 18. Loose stools  2/20- has been severe but improving to 1x/day- con't regimen and monitoring.  19. Phosphorus elevated  2/20- Phos up to 9.1- from 6.6- will d/w renal    I spent a total of  37  minutes on total care today- >50% coordination of care- due to team conference and discussion of d/c date    LOS: 5 days A FACE TO FACE EVALUATION WAS PERFORMED  Brodrick Curran 09/12/2021, 8:55 AM

## 2021-09-12 NOTE — Discharge Summary (Signed)
Physician Discharge Summary  Patient ID: Felicia Williams MRN: [id number] DOB/AGE: [birth year] 81 y.o.  Admit date: 09/07/2021 Discharge date: 09/13/2021  Discharge Diagnoses:  Principal Problem:   Toxic encephalopathy Active Problems:   ESRD on dialysis [facility])   Pruritus   Sepsis due to peritonitis associated with peritoneal dialysis ([place])   Debility   Malnutrition of moderate degree   Discharged Condition: stable  Significant Diagnostic Studies:   Labs:  Basic Metabolic Panel: Recent Labs  Lab 09/08/21 0527 09/11/21 0603  NA 136 135  K 3.4* 3.7  CL 94* 93*  CO2 25 25  GLUCOSE 114* 97  BUN 64* 62*  CREATININE 12.36* 11.76*  CALCIUM 9.3 9.4  PHOS  --  9.1*    CBC: Recent Labs  Lab 09/08/21 0527 09/11/21 0603  WBC 11.8* 9.1  NEUTROABS 8.5*  --   HGB 9.4* 9.6*  HCT 27.1* 28.2*  MCV 105.0* 105.2*  PLT 285 339    CBG: Recent Labs  Lab 09/11/21 0624  GLUCAP 92    Brief HPI:   Felicia Williams is a 81 y.o. 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  She was found to be septic due to peritonitis with negative fluid with 8006 and 67 WBCs and mesothelial cells.  BC/peritoneal cultures were negative.  She was started on Vanco and cefepime for treatment of PD associated peritonitis but developed significant encephalopathy with lethargy, nonverbal state with myoclonus as well as hallucinations.  Neurology was consulted for input.  And work-up remained a severe toxic/metabolic encephalopathy on EEG which was felt to be cephalosporin induced.  Antibiotic regimen changed to vancomycin/meropenem for 2 course with end date of 02/17.  Patient's mental Felicia Williams was improving with verbal output back to baseline and tremors were resolving.  She continued to be limited by weakness with unsteady gait, for poor awareness of  safety.  CIR was recommended due to functional decline.    Hospital Course: Felicia Williams was admitted to rehab 09/07/2021 for inpatient therapies to consist of PT and OT at least three hours five days a week. Past admission physiatrist, therapy team and rehab RN have worked together to provide customized collaborative inpatient rehab.  She completed her antibiotic course of meropenem and Vanco by 02/17.  Her fluid has been reported to be clear since completion of antibiotics. Protonix was discontinued she continues on Pepcid daily manage GERD symptoms diarrhea has resolved with intermittent loose stools which have been managed with as needed use of Lomotil. Her blood pressures were monitored on TID basis and Coreg was discontinued due to issues with hypotension.     Follow-up labs showed reactive leukocytosis has resolved and H&H is relatively stable on weekly Aranesp.  Confusion has resolved and mental status is close to baseline.  She had issues with insomnia intermittently requiring low-dose trazodone.  PD has been ongoing with left neurology following for management/input.  She continues on CCPD with 5 exchanges all with 1.5% fluids. . She has also had issues with chronic pruritis and atarax was added with some improvement in symptoms. Mentation is back to baseline and she has progressed to modified independence in supervised setting. She will continue to receive follow up HHPT and [place] [facility] after discharge.    Rehab course: During patient's stay in rehab weekly conference was  held to monitor patient's progress, set goals and  discuss barriers to discharge. At admission, patient required contact-guard assist with mobility and supervision to CGA with ADL tasks.  She  has had improvement in activity tolerance, balance, postural control as well as ability to compensate for deficits.  She is able to complete ADL tasks at modified independent level. She is independent for transfers and  to ambulate 300 feet with rolling walker.  Berg balance score has improved to 51/56.   Disposition: Home  Diet: Regular  Special Instructions: No driving or strenuous activity till cleared by MD.   Allergies as of 09/13/2021       Reactions   Cefepime Other (See Comments)   Feb 2023 Encephalopathy with questionable seizures. Seems to be tolerating cefazolin    Sulfa Antibiotics Other (See Comments)   Shut pt's kidneys down        Medication List     STOP taking these medications    (feeding supplement) PROSource Plus liquid   carvedilol 12.5 MG tablet Commonly known as: COREG   Darbepoetin Alfa 100 MCG/0.5ML Sosy injection Commonly known as: ARANESP   feeding supplement Liqd   losartan 50 MG tablet Commonly known as: COZAAR   meropenem 500 mg in sodium chloride 0.9 % 100 mL       TAKE these medications    acetaminophen 325 MG tablet Commonly known as: TYLENOL Take 2 tablets (650 mg total) by mouth every 6 (six) hours as needed for mild pain. What changed: reasons to take this   camphor-menthol lotion Commonly known as: SARNA Apply topically as needed for itching.   famotidine 20 MG tablet Commonly known as: Pepcid Take 1 tablet (20 mg total) by mouth at bedtime.   gentamicin cream 0.1 % Commonly known as: GARAMYCIN Apply 1 application topically daily.   hydrOXYzine 10 MG tablet Commonly known as: ATARAX Take 1 tablet (10 mg total) by mouth 3 (three) times daily as needed for itching.   loperamide 2 MG capsule Commonly known as: IMODIUM Take 1 capsule (2 mg total) by mouth as needed for diarrhea or loose stools.   multivitamin Tabs tablet Take 1 tablet by mouth at bedtime.   sevelamer carbonate 2.4 g Pack Commonly known as: RENVELA Take 2.4 g by mouth 3 (three) times daily with meals.   traZODone 50 MG tablet Commonly known as: DESYREL Take 0.5-1 tablets (25-50 mg total) by mouth at bedtime as needed for sleep.        Follow-up  Information     Felicia Sill, NP Follow up.   Specialty: Internal Medicine Why: call for follow up appointment--post hospital follow up Contact information: [address] [place] [address] [phone]         Courtney Heys, MD. Call.   Specialty: Physical Medicine and Rehabilitation Why: As needed Contact information: [address] [place] [address] [phone]         Anthonette Legato, MD Follow up.   Specialty: Nephrology Contact information: [place] [address] [phone]                 Signed: Bary Leriche 09/13/2021, 6:19 PM

## 2021-09-12 NOTE — Progress Notes (Signed)
Occupational Therapy Discharge Summary  Patient Details  Name: Felicia Williams MRN: [id number] Date of Birth: Nov 29, 1940   Patient has met 9 of 9 long term goals due to improved activity tolerance and improved balance.  Patient to discharge at overall Modified Independent level.  Patient's care partner is independent to provide the necessary physical assistance at discharge.    Pt education only. Pt is competent and did not need family education with her daughter.   Reasons goals not met: n/a  Recommendation:  Patient will benefit from ongoing skilled OT services in home health setting to continue to advance functional skills in the area of iADL.  Equipment: No equipment provided - pt has a bSC if needed at home  Reasons for discharge: treatment goals met  Patient/family agrees with progress made and goals achieved: Yes  OT Discharge Precautions/Restrictions  Precautions Precautions: Fall Precaution Comments: peritoneal dialysis catheter Restrictions Weight Bearing Restrictions: No   ADL ADL Eating: Independent Grooming: Independent Where Assessed-Grooming: Standing at sink Upper Body Bathing: Modified independent Lower Body Bathing: Modified independent Upper Body Dressing: Independent Lower Body Dressing: Independent Toileting: Modified independent Toilet Transfer: Modified independent Toilet Transfer Method: Ambulating Vision Baseline Vision/History: 1 Wears glasses Patient Visual Report: No change from baseline Vision Assessment?: No apparent visual deficits Perception  Perception: Within Functional Limits Praxis Praxis: Intact Cognition Overall Cognitive Status: Within Functional Limits for tasks assessed Arousal/Alertness: Awake/alert Orientation Level: Oriented X4 Year: 2023 Month: February Day of Week: Correct Memory: Appears intact Immediate Memory Recall: Sock;Blue;Bed Memory Recall Sock: Without Cue Memory Recall Blue: Without Cue Memory Recall  Bed: Without Cue Awareness: Appears intact Problem Solving: Appears intact Safety/Judgment: Appears intact Sensation Sensation Light Touch: Appears Intact Hot/Cold: Appears Intact Proprioception: Appears Intact Stereognosis: Appears Intact Coordination Gross Motor Movements are Fluid and Coordinated: Yes Fine Motor Movements are Fluid and Coordinated: Yes Motor  Motor Motor: Within Functional Limits Motor - Discharge Observations: generalized weakness Mobility  Bed Mobility Bed Mobility: Rolling Right;Rolling Left;Supine to Sit;Sit to Supine Rolling Right: Independent Rolling Left: Independent Supine to Sit: Independent Sit to Supine: Independent Transfers Sit to Stand: Independent Stand to Sit: Independent  Trunk/Postural Assessment  Cervical Assessment Cervical Assessment: Within Functional Limits Thoracic Assessment Thoracic Assessment: Exceptions to [facility] Lumbar Assessment Lumbar Assessment: Exceptions to [facility] Postural Control Postural Control: Within Functional Limits  Balance Balance Balance Assessed: Yes Standardized Balance Assessment Standardized Balance Assessment: Berg Balance Test Dynamic Sitting Balance Dynamic Sitting - Level of Assistance: 7: Independent Static Standing Balance Static Standing - Level of Assistance: 6: Modified independent (Device/Increase time) Dynamic Standing Balance Dynamic Standing - Level of Assistance: 6: Modified independent (Device/Increase time) Extremity/Trunk Assessment RUE Assessment Active Range of Motion (AROM) Comments: WFL General Strength Comments: 4/5 LUE Assessment Active Range of Motion (AROM) Comments: WFL General Strength Comments: 4/5   [place] 09/12/2021, 11:14 AM

## 2021-09-12 NOTE — Progress Notes (Signed)
Occupational Therapy Session Note  Patient Details  Name: Felicia Williams MRN: [id number] Date of Birth: May 01, 1941  Today's Date: 09/12/2021 OT Individual Time: 1030-1130 OT Individual Time Calculation (min): 60 min    Short Term Goals: Week 1:  OT Short Term Goal 1 (Week 1): STG = LTGs  Skilled Therapeutic Interventions/Progress Updates:    Pt received in room sitting EOB.  Pt reported that she was able to get bathed and dressed with no A today.  She demonstrated for me moving from sit to supine to sit independently, standing up and ambulating to toilet, sitting down and getting up using a rollator for support with ambulation all with mod I.  Due to improved balance and independence, pt is now MOD IND in her room. Safety plan changed and sign put up.  Pt used rollator to ambulate to main gym and worked on balance and general strengthening exercises: -sit to stand pushing large ball forward and back 12 x3 -standing reaching ball over head 12 x3 - standing and twisting ball side to side -seated bicep curls and arm extensions with 4# dowel bar.  Pt reports she has a weighted dowel bar at home she can use.   Also discussed how pt stated that prior to admission, pt tended to not be active and only watch TV.  Discussed options for her to be more active with visiting with friends, going out to lunch with her daughter.   Pt ambulated back to her room and returned to room with all needs met.     Therapy Documentation Precautions:  Precautions Precautions: Fall Precaution Comments: peritoneal dialysis catheter Restrictions Weight Bearing Restrictions: No     Pain: no c/o pain    ADL: ADL Eating: Independent Grooming: Independent Where Assessed-Grooming: Standing at sink Upper Body Bathing: Modified independent Lower Body Bathing: Modified independent Upper Body Dressing: Independent Lower Body Dressing: Independent Toileting: Modified independent Toilet Transfer: Modified  independent Toilet Transfer Method: Ambulating   Therapy/Group: Individual Therapy  [place] 09/12/2021, 12:00 PM

## 2021-09-12 NOTE — Progress Notes (Signed)
Inpatient Rehabilitation Discharge Medication Review by a Pharmacist  A complete drug regimen review was completed for this patient to identify any potential clinically significant medication issues.  High Risk Drug Classes Is patient taking? Indication by Medication  Antipsychotic No   Anticoagulant No   Antibiotic Yes Gentamicin cr- exit site PD cath as prophylaxis  Opioid No   Antiplatelet No   Hypoglycemics/insulin No   Vasoactive Medication No   Chemotherapy No   Other Yes Renvela- phos binder Trazodone- sleep Pepcid- GERD     Type of Medication Issue Identified Description of Issue Recommendation(s)  Drug Interaction(s) (clinically significant)     Duplicate Therapy     Allergy     No Medication Administration End Date     Incorrect Dose     Additional Drug Therapy Needed     Significant med changes from prior encounter (inform family/care partners about these prior to discharge).    Other       Clinically significant medication issues were identified that warrant physician communication and completion of prescribed/recommended actions by midnight of the next day:  No  Time spent performing this drug regimen review (minutes):  30   Lucee Brissett BS, PharmD, BCPS Clinical Pharmacist 09/12/2021 3:09 PM

## 2021-09-12 NOTE — Patient Care Conference (Signed)
Inpatient RehabilitationTeam Conference and Plan of Care Update Date: 09/12/2021   Time: 12:19 PM    Patient Name: Felicia Williams      Medical Record Number: [id number]  Date of Birth: 21-May-1941 Sex: Female         Room/Bed: 4Y18H/6D14H-70 Payor Info: Payor: [employer] MEDICARE / Plan: [facility] MEDICARE / Product Type: *No Product type* /    Admit Date/Time:  09/07/2021  6:09 PM  Primary Diagnosis:  Toxic encephalopathy  Hospital Problems: Principal Problem:   Toxic encephalopathy Active Problems:   Debility   Malnutrition of moderate degree    Expected Discharge Date: Expected Discharge Date: 09/13/21  Team Members Present: Physician leading conference: Dr. Courtney Heys Social Worker Present: Ovidio Kin, LCSW Nurse Present: Dorien Chihuahua, RN PT Present: Estevan Ryder, PT OT Present: Meriel Pica, OT PPS Coordinator present : Gunnar Fusi, SLP     Current Status/Progress Goal Weekly Team Focus  Bowel/Bladder     PD; cont of bowel   Continent   Toileting  Swallow/Nutrition/ Hydration             ADL's   mod I with ADLs - all goals met.  Mod I  ADL training, activity tolerance, strengthening, pt ed   Mobility   ModI bed mob, ModI STS, ModI gait household distances, Spv longer distances with rollator, spv 16 steps B HR  ModI  dc planning, pt/fam ed, endurance   Communication             Safety/Cognition/ Behavioral Observations            Pain     Chronic shoulder pain   Pain managed with prn meds   Assess need for and effectiveness of meds  Skin     N/A          Discharge Planning:  HOme daughter to assist-pt doing quite well and will reach goals by tomorrow.   Team Discussion: Chronic shoulder pain, doing well overall post peritonitis. Continue PD treatments; continent of bowel.  Patient on target to meet rehab goals: yes, currently mod I - supervision overall. Able to manage long distance walking and 16 steps.  *See Care Plan and progress notes  for long and short-term goals.   Revisions to Treatment Plan:  Diet upgrade to regular diet   Teaching Needs: Safety, medications, dietary modifications, transfers, etc  Current Barriers to Discharge: Decreased caregiver support and peritoneal dialysis  Possible Resolutions to Barriers: HH follow up services recommended DME: Rollator (has RW, TTB shower chair and  W/C     Medical Summary Current Status: B/L shoulder pain- chronic- PD from ESRD_ was having N/V yesterday AM- got better- skin ok- PD catheter  Barriers to Discharge: Hemodialysis;Decreased family/caregiver support;Home enviroment access/layout;Weight bearing restrictions  Barriers to Discharge Comments: d/c tmorrow Possible Resolutions to [employer]: will upgrade diet to regular diet- d/c tomorrow- H/H= ordered rollator   Continued Need for Acute Rehabilitation Level of Care: The patient requires daily medical management by a physician with specialized training in physical medicine and rehabilitation for the following reasons: Direction of a multidisciplinary physical rehabilitation program to maximize functional independence : Yes Medical management of patient stability for increased activity during participation in an intensive rehabilitation regime.: Yes Analysis of laboratory values and/or radiology reports with any subsequent need for medication adjustment and/or medical intervention. : Yes   I attest that I was present, lead the team conference, and concur with the assessment and plan of the team.  Margarito Liner 09/12/2021, 12:59 PM

## 2021-09-12 NOTE — Progress Notes (Signed)
Patient ID: Felicia Williams, female   DOB: 25-Jan-1941, 81 y.o.   MRN: [id number] Met with pt to inform team conference goals of mod/I level and discharge tomorrow. She is pleased with this plan and glad MD has changed her diet to regular. She is aware have ordered her a rollator and she will need to pay the co-pay before [employer] will deliver it to her. She has the number and will call them. Home health is arranged via [facility] will work toward discharge tomorrow.

## 2021-09-12 NOTE — Progress Notes (Signed)
Physical Therapy Discharge Summary  Patient Details  Name: Felicia Williams MRN: [id number] Date of Birth: 10-10-1940  Today's Date: 09/12/2021 PT Individual Time: 2094-7096 PT Individual Time Calculation (min): 55 min  Daily Session: Patient received sitting up in recliner, agreeable to PT. She denies pain. Patient ambulating to 4th floor gym with Rollator MOdI. She was able to safely transfer into a car and ambulate up and down a ramp ModI with a rollator. Patient completed the berg balance scale and scored a 51/56 indicating decreased risk from falling. PT discussing with patient that given PMH, she is still considered a fall risk- patient verbalized understanding. She negotiated 20 steps with B HR ModI. Patient completing dynamic standing balance task as well as standing endurance task with Wii bowling. Patient completing 10 mins on NuStep with B LE for improved endurance and LE strength. Handoff to OT for group therapy session.   Patient has met 9 of 9 long term goals due to improved activity tolerance, improved balance, improved postural control, increased strength, and ability to compensate for deficits.  Patient to discharge at an ambulatory level Modified Independent.   Patient's care partner is independent to provide the necessary  no assist needed  assistance at discharge. Family did not participate in family education prior to dc.    Recommendation:  Patient will benefit from ongoing skilled PT services in outpatient setting to continue to advance safe functional mobility, address ongoing impairments in endurance, dynamic balance, and minimize fall risk.  Equipment: rollator  Reasons for discharge: treatment goals met and discharge from hospital  Patient/family agrees with progress made and goals achieved: Yes  PT Discharge Precautions/Restrictions Precautions Precautions: Fall Precaution Comments: peritoneal dialysis catheter Restrictions Weight Bearing Restrictions:  No Pain Pain Assessment Pain Scale: 0-10 Pain Score: 0-No pain Pain Interference Pain Interference Pain Effect on Sleep: 0. Does not apply - I have not had any pain or hurting in the past 5 days Pain Interference with Therapy Activities: 0. Does not apply - I have not received rehabilitationtherapy in the past 5 days Pain Interference with Day-to-Day Activities: 1. Rarely or not at all Vision/Perception  Vision - History Ability to See in Adequate Light: 0 Adequate Perception Perception: Within Functional Limits Praxis Praxis: Intact  Cognition Overall Cognitive Status: Within Functional Limits for tasks assessed Arousal/Alertness: Awake/alert Orientation Level: Oriented X4 Memory: Appears intact Awareness: Appears intact Problem Solving: Appears intact Safety/Judgment: Appears intact Sensation Sensation Light Touch: Appears Intact Hot/Cold: Appears Intact Proprioception: Appears Intact Stereognosis: Appears Intact Coordination Gross Motor Movements are Fluid and Coordinated: Yes Fine Motor Movements are Fluid and Coordinated: Yes Motor  Motor Motor: Within Functional Limits Motor - Discharge Observations: generalized weakness  Mobility Bed Mobility Bed Mobility: Rolling Right;Rolling Left;Supine to Sit;Sit to Supine Rolling Right: Independent Rolling Left: Independent Supine to Sit: Independent Sit to Supine: Independent Transfers Transfers: Sit to Stand;Stand to Sit;Stand Pivot Transfers Sit to Stand: Independent Stand to Sit: Independent Stand Pivot Transfers: Independent with assistive device Transfer (Assistive device): Rolling walker Locomotion  Gait Ambulation: Yes Gait Assistance: Independent with assistive device Gait Distance (Feet): 300 Feet Assistive device: Rolling walker;4-wheeled walker Gait Gait: Yes Gait Pattern: Impaired Gait Pattern: Step-through pattern;Trunk flexed;Narrow base of support Gait velocity: decreased Stairs / Additional  Locomotion Stairs: Yes Stairs Assistance: Supervision/Verbal cueing Stair Management Technique: Two rails Number of Stairs: 16 Height of Stairs: 6 Ramp: Independent with assistive device Curb: Supervision/Verbal cueing Wheelchair Mobility Wheelchair Mobility: No  Trunk/Postural Assessment  Cervical Assessment Cervical Assessment: Within  Functional Limits Thoracic Assessment Thoracic Assessment: Exceptions to [facility] Lumbar Assessment Lumbar Assessment: Exceptions to [facility] Postural Control Postural Control: Within Functional Limits  Balance Balance Balance Assessed: Yes Standardized Balance Assessment Standardized Balance Assessment: Berg Balance Test Berg Balance Test Sit to Stand: Able to stand without using hands and stabilize independently Standing Unsupported: Able to stand safely 2 minutes Sitting with Back Unsupported but Feet Supported on Floor or Stool: Able to sit safely and securely 2 minutes Stand to Sit: Sits safely with minimal use of hands Transfers: Able to transfer safely, minor use of hands Standing Unsupported with Eyes Closed: Able to stand 10 seconds safely Standing Ubsupported with Feet Together: Able to place feet together independently and stand 1 minute safely From Standing, Reach Forward with Outstretched Arm: Can reach forward >12 cm safely (5") From Standing Position, Pick up Object from Floor: Able to pick up shoe safely and easily From Standing Position, Turn to Look Behind Over each Shoulder: Looks behind from both sides and weight shifts well Turn 360 Degrees: Able to turn 360 degrees safely in 4 seconds or less Standing Unsupported, Alternately Place Feet on Step/Stool: Able to complete 4 steps without aid or supervision Standing Unsupported, One Foot in Front: Able to plae foot ahead of the other independently and hold 30 seconds Standing on One Leg: Able to lift leg independently and hold 5-10 seconds Total Score: 51 Dynamic Sitting Balance Dynamic  Sitting - Level of Assistance: 7: Independent Static Standing Balance Static Standing - Level of Assistance: 6: Modified independent (Device/Increase time) Dynamic Standing Balance Dynamic Standing - Level of Assistance: 6: Modified independent (Device/Increase time) Extremity Assessment      RLE Assessment RLE Assessment: Within Functional Limits LLE Assessment LLE Assessment: Within Functional Limits    Debbora Dus 09/12/2021, 8:55 AM

## 2021-09-13 DIAGNOSIS — G929 Unspecified toxic encephalopathy: Secondary | ICD-10-CM

## 2021-09-13 NOTE — Progress Notes (Signed)
PROGRESS NOTE   Subjective/Complaints:  Pt ready for d/c today- wants ot get home to dog.  And family.   ROS:  Pt denies SOB, abd pain, CP, N/V/C/D, and vision changes  Objective:   No results found. Recent Labs    09/11/21 0603  WBC 9.1  HGB 9.6*  HCT 28.2*  PLT 339   Recent Labs    09/11/21 0603  NA 135  K 3.7  CL 93*  CO2 25  GLUCOSE 97  BUN 62*  CREATININE 11.76*  CALCIUM 9.4    Intake/Output Summary (Last 24 hours) at 09/13/2021 0957 Last data filed at 09/13/2021 0900 Gross per 24 hour  Intake 390 ml  Output --  Net 390 ml        Physical Exam: Vital Signs Blood pressure (!) 146/56, pulse 74, temperature 98.6 F (37 C), temperature source Oral, resp. rate 16, height 4\' 11"  (1.499 m), weight 53.8 kg, SpO2 97 %.      General: awake, alert, appropriate, laying supine inbed- RN taking off PD; NAD HENT: conjugate gaze; oropharynx moist CV: regular rate; no JVD Pulmonary: CTA B/L; no W/R/R- good air movement GI: soft, NT, ND, (+)BS- PD catheter- getting off PD this AM Psychiatric: appropriate Neurological: Ox3- slightly delayed responses  MS: B/L biceps tendinitis pain with TTP over biceps tendon insertion Extremities: No clubbing, cyanosis, or edema Skin: No evidence of breakdown, no evidence of rash Neurologic: Cranial nerves II through XII intact, motor strength is 5/5 in bilateral deltoid, bicep, tricep, grip, hip flexor, knee extensors, ankle dorsiflexor and plantar flexor  Musculoskeletal: Full range of motion in all 4 extremities. No joint swelling Gait amb with RW supervision > 100'  Assessment/Plan: 1. Functional deficits which require 3+ hours per day of interdisciplinary therapy in a comprehensive inpatient rehab setting. Physiatrist is providing close team supervision and 24 hour management of active medical problems listed below. Physiatrist and rehab team continue to assess  barriers to discharge/monitor patient progress toward functional and medical goals  Care Tool:  Bathing    Body parts bathed by patient: Right arm, Left arm, Chest, Abdomen, Front perineal area, Buttocks, Right upper leg, Left upper leg, Right lower leg, Left lower leg, Face         Bathing assist Assist Level: Independent with assistive device     Upper Body Dressing/Undressing Upper body dressing   What is the patient wearing?: Pull over shirt, Bra    Upper body assist Assist Level: Independent    Lower Body Dressing/Undressing Lower body dressing      What is the patient wearing?: Underwear/pull up, Pants     Lower body assist Assist for lower body dressing: Independent with assitive device     Toileting Toileting    Toileting assist Assist for toileting: Independent with assistive device     Transfers Chair/bed transfer  Transfers assist     Chair/bed transfer assist level: Independent with assistive device Chair/bed transfer assistive device: Programmer, multimedia   Ambulation assist      Assist level: Independent with assistive device Assistive device: Rollator Max distance: 350   Walk 10 feet activity   Assist  Assist level: Independent with assistive device Assistive device: Rollator   Walk 50 feet activity   Assist    Assist level: Independent with assistive device Assistive device: Rollator    Walk 150 feet activity   Assist    Assist level: Independent with assistive device Assistive device: Rollator    Walk 10 feet on uneven surface  activity   Assist     Assist level: Independent with assistive device Assistive device: Walker-rolling   Wheelchair     Assist Is the patient using a wheelchair?: No             Wheelchair 50 feet with 2 turns activity    Assist            Wheelchair 150 feet activity     Assist          Blood pressure (!) 146/56, pulse 74, temperature  98.6 F (37 C), temperature source Oral, resp. rate 16, height 4\' 11"  (1.499 m), weight 53.8 kg, SpO2 97 %.    Medical Problem List and Plan: 1. Functional deficits secondary to Toxic metabolic encephalopathy due to peritonitis associated with PD              -patient may not shower- due to PD catheter             -ELOS/Goals: 10-14 days supervision  Continue CIR- PT, OT and SLP  Team conference today to determine length of stay  2/22- d/c today- doesn't need f/u with PM&R- since is recovered cognitively.  2.  Antithrombotics: -DVT/anticoagulation:  Pharmaceutical: may d/c Heparin given amb distance  2/21- still on SQ heparin- if going home tomorrow- will d/c at d/c. Will see how long walking 2/22- d/c at d/c.              -antiplatelet therapy: n/a 3. Pain Management:  N/a 4. Mood: LCSW to follow for evaluation and support.              -antipsychotic agents: N/A 5. Neuropsych: This patient may be intermittently capable of making decisions on her own behalf. 6. Skin/Wound Care: Routine pressure relief measure             --daily care to PD site 7 . Fluids/Electrolytes/Nutrition:  Monitor I/O. Check CMET in am 8. Peritonitis/sepsis: Vancomycin 1 gram MWF with meropenum to continue thorough 02/17 9. Cefepime induced neurotoxicity/encephalopathy: Has cleared but still not back to baseline per daughter.  10. Diarrhea: Related to antibiotics and/or ensure.  -continue prosource BID. Immodium prn  11. ESRD: Continue PD at the end of the day to help with tolerance of activity 12. Anemia of chronic disease: On aranesp weekly.              --continue to monitor H/H with routine checks. 2/20- Hb up to 9.6- con't to monitor  13. Gastroparesis/GERD?: Continue pepcid BID. Continue lomotil bid prn.  --Discontinue protonix due to question SE of diarrhea.  --Was managed without meds at home. 2/21- having 1 BM /day usually- LBM 2 days ago- but still loose per pt- con't reigmen  14. HTN: Monitor BP  TID--continue coreg bid.  Vitals:   09/12/21 2003 09/13/21 0322  BP: (!) 118/53 (!) 146/56  Pulse: 79 74  Resp: 17 16  Temp: 98.6 F (37 C) 98.6 F (37 C)  SpO2: 96% 97%   2/20- BP controlled- con't regimen  2/22- BP controlled- con't regimen 15. Poor appetite- is acute on chronic- might need appetite stimulant  16. Mild HypoK+ defer to renal given her PD status   2/20- K+ 3.5-  17.  Pruritis associated with ESRD will trial atarax 10mg  Q8 prn  2/20- went well- con't Atarax prn for itching- slept better 18. Loose stools  2/20- has been severe but improving to 1x/day- con't regimen and monitoring.  19. Phosphorus elevated  2/20- Phos up to 9.1- from 6.6- will d/w renal      LOS: 6 days A FACE TO FACE EVALUATION WAS PERFORMED  Solae Norling 09/13/2021, 9:57 AM

## 2021-09-13 NOTE — Progress Notes (Signed)
Patient ID: Felicia Williams, female   DOB: 1941/06/15, 81 y.o.   MRN: [id number] S: no c/o    O:BP (!) 120/55 (BP Location: Right Arm)    Pulse 66    Temp (!) 97.5 F (36.4 C) (Oral)    Resp 18    Ht 4\' 11"  (1.499 m)    Wt 53.8 kg    SpO2 100%    BMI 23.96 kg/m    Intake/Output Summary (Last 24 hours) at 09/12/2021 1319 Last data filed at 09/12/2021 9622    Gross per 24 hour  Intake 240 ml  Output 1047 ml  Net -807 ml      Intake/Output: I/O last 3 completed shifts: In: 9005 [Other:9005] Out: 11009 [Other:11009]  Intake/Output this shift:  Total I/O In: 240 [P.O.:240] Out: -  Weight change: -0.9 kg Gen: NAD CVS: RRR Resp: CTA Abd: +BS, soft, NT/ND Ext: no edema     CCPD, coordinated through [facility] (Dr. Holley Raring) [phone]  57 kg EDW Last ESA was dosed at home per the HD unit. Mircera is ordered for 50 mcg on 08/10/21   Assessment/Plan: Peritonitis - culture negative.  Gram stain with GPC and had cloudy fluid and abdominal pain.  Treated with IV antibiotics (initiated before sample was collected).  Currently on Ancef (out of concern of encephalopathy with cefepime).  Then changed to meropenem x 5d due to no improvement with cephalosporins.  Completed 2 weeks of abx on 09/09/21. Last cell count was 11 on 08/30/21.  ESRD - continue with CCPD.  5 exchanges all with 1.5%. Acute metabolic encephalopathy - likely due to cephalosporins, resolved now.  Anemia of ESRD - currently on weekly aranesp. 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  Carvedilol on hold due to hypotension. Deconditioning - continue with CIR   Kelly Splinter, MD 09/12/2021, 1:22 PM

## 2021-09-13 NOTE — Progress Notes (Incomplete)
Patient ID: Felicia Williams, female   DOB: [birth year], 81 y.o.   MRN: [id number] S:Feels well, no issues with PD. O:BP (!) 146/56 (BP Location: Left Arm)    Pulse 74    Temp 98.6 F (37 C) (Oral)    Resp 16    Ht 4\' 11"  (1.499 m)    Wt 54.1 kg    SpO2 97%    BMI 24.07 kg/m   Intake/Output Summary (Last 24 hours) at 09/13/2021 1054 Last data filed at 09/13/2021 0900 Gross per 24 hour  Intake 390 ml  Output --  Net 390 ml    Intake/Output: I/O last 3 completed shifts: In: 480 [P.O.:480] Out: 1047 [Other:1047]  Intake/Output this shift:  Total I/O In: 150 [P.O.:150] Out: -  Weight change:  Gen: NAD CVS: RRR Resp: CTA Abd: +BS, soft, NT/ND Ext: no edema  Recent Labs  Lab 09/08/21 0527 09/11/21 0603  NA 136 135  K 3.4* 3.7  CL 94* 93*  CO2 25 25  GLUCOSE 114* 97  BUN 64* 62*  CREATININE 12.36* 11.76*  ALBUMIN 2.3* 2.4*  CALCIUM 9.3 9.4  PHOS  --  9.1*  AST 19  --   ALT <5  --     Liver Function Tests: Recent Labs  Lab 09/08/21 0527 09/11/21 0603  AST 19  --   ALT <5  --   ALKPHOS 85  --   BILITOT 0.4  --   PROT 6.0*  --   ALBUMIN 2.3* 2.4*    No results for input(s): LIPASE, AMYLASE in the last 168 hours. No results for input(s): AMMONIA in the last 168 hours. CBC: Recent Labs  Lab 09/08/21 0527 09/11/21 0603  WBC 11.8* 9.1  NEUTROABS 8.5*  --   HGB 9.4* 9.6*  HCT 27.1* 28.2*  MCV 105.0* 105.2*  PLT 285 339    Cardiac Enzymes: No results for input(s): CKTOTAL, CKMB, CKMBINDEX, TROPONINI in the last 168 hours. CBG: Recent Labs  Lab 09/11/21 0624  GLUCAP 92     Iron Studies: No results for input(s): IRON, TIBC, TRANSFERRIN, FERRITIN in the last 72 hours. Studies/Results: No results found.  (feeding supplement) PROSource Plus  30 mL Oral BID BM   Chlorhexidine Gluconate Cloth  6 each Topical Daily   [START ON 09/14/2021] darbepoetin (ARANESP) injection - NON-DIALYSIS  100 mcg Subcutaneous Q Thu-1800   famotidine  20 mg Oral Daily    feeding supplement  237 mL Oral BID BM   gentamicin cream  1 application Topical Daily   heparin  5,000 Units Subcutaneous Q8H   multivitamin  1 tablet Oral QHS   sevelamer carbonate  2.4 g Oral TID WC    BMET    Component Value Date/Time   NA 135 09/11/2021 0603   K 3.7 09/11/2021 0603   CL 93 (L) 09/11/2021 0603   CO2 25 09/11/2021 0603   GLUCOSE 97 09/11/2021 0603   BUN 62 (H) 09/11/2021 0603   CREATININE 11.76 (H) 09/11/2021 0603   CALCIUM 9.4 09/11/2021 0603   CALCIUM 7.1 (L) 01/10/2019 0632   GFRNONAA 3 (L) 09/11/2021 0603   GFRAA 11 (L) 01/20/2019 0608   CBC    Component Value Date/Time   WBC 9.1 09/11/2021 0603   RBC 2.68 (L) 09/11/2021 0603   HGB 9.6 (L) 09/11/2021 0603   HCT 28.2 (L) 09/11/2021 0603   PLT 339 09/11/2021 0603   MCV 105.2 (H) 09/11/2021 0603   MCH 35.8 (H) 09/11/2021 0603   MCHC  34.0 09/11/2021 0603   RDW 13.7 09/11/2021 0603   LYMPHSABS 1.5 09/08/2021 0527   MONOABS 1.2 (H) 09/08/2021 0527   EOSABS 0.4 09/08/2021 0527   BASOSABS 0.1 09/08/2021 0527   CCPD, coordinated through [facility] (Dr. Holley Raring) [phone]  57 kg EDW Last ESA was dosed at home per the HD unit. Mircera is ordered for 50 mcg on 08/10/21  Assessment/Plan:  Peritonitis - culture negative.  Gram stain with GPC and had cloudy fluid and abdominal pain.  Treated with IV antibiotics (initiated before sample was collected).  Currently on Ancef (out of concern of encephalopathy with cefepime).  Then changed to meropenem due to no improvement with cephalosporins.  Completed 2 weeks of abx on 09/09/21. ESRD - continue with CCPD.  5 exchanges all with 1.5%. Acute metabolic encephalopathy - likely due to cephalosporins.  Now back at baseline. Anemia of ESRD - currently on weekly aranesp. 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  Carvedilol on hold due to hypotension. Deconditioning - continue with CIR  [name], MD [facility] [phone]

## 2021-09-13 NOTE — Progress Notes (Signed)
Inpatient Rehabilitation Care Coordinator Discharge Note   Patient Details  Name: Felicia Williams MRN: [id number] Date of Birth: 1940-08-13   Discharge location: HOME WITH DAUGHTER ASSISTING  Length of Stay:  6 DAYS  Discharge activity level: SUPERVISION-MOD/I LEVEL  Home/community participation: ACTIVE  Patient response YO:MAYOKH Literacy - How often do you need to have someone help you when you read instructions, pamphlets, or other written material from your doctor or pharmacy?: Never  Patient response TX:HFSFSE Isolation - How often do you feel lonely or isolated from those around you?: Never  Services provided included: MD, RD, PT, OT, RN, CM, Pharmacy, SW  Financial Services:  Charity fundraiser Utilized: [employer]  Choices offered to/list presented to: PT  Follow-up services arranged:  Patient/Family request agency HH/DME, DME, [place] Agency: [facility]-PT & rn    DME : [facility] HH/DME Requested Agency: [employer] [facility]  Patient response to transportation need: Is the patient able to respond to transportation needs?: Yes In the past 12 months, has lack of transportation kept you from medical appointments or from getting medications?: No In the past 12 months, has lack of transportation kept you from meetings, work, or from getting things needed for daily living?: No    Comments (or additional information):PT DID WELL AND REACHED HER GOALS QUICKLY. DAUGHTER TO ASSIST AT DC  Patient/Family verbalized understanding of follow-up arrangements:  Yes  Individual responsible for coordination of the follow-up plan: PAM-DAUGHTER [phone]  Confirmed correct DME delivered: Elease Hashimoto 09/13/2021    Bohdan Macho, [place]

## 2021-09-13 NOTE — Progress Notes (Incomplete)
INPATIENT REHABILITATION DISCHARGE NOTE   Discharge instructions by:Pam PA   Verbalized understanding:yes  Skin care/Wound care healing?none  Pain:none  IV's:none d/c  Tubes/Drains:peritoneal cath  O2:none  Safety instructions:done  Patient belongings:done  Discharged PF:[id number]  Discharged [id number]  Notes:

## 2021-09-27 ENCOUNTER — Emergency Department (HOSPITAL_COMMUNITY): Payer: Medicare Other

## 2021-09-27 ENCOUNTER — Encounter (HOSPITAL_COMMUNITY): Payer: Self-pay

## 2021-09-27 ENCOUNTER — Inpatient Hospital Stay (HOSPITAL_COMMUNITY)
Admission: EM | Admit: 2021-09-27 | Discharge: 2021-10-03 | DRG: 853 | Disposition: A | Discharge status: Home / Self Care | Payer: Medicare Other | Attending: Internal Medicine | Admitting: Internal Medicine

## 2021-09-27 DIAGNOSIS — R6520 Severe sepsis without septic shock: Secondary | ICD-10-CM

## 2021-09-27 DIAGNOSIS — Z8249 Family history of ischemic heart disease and other diseases of the circulatory system: Secondary | ICD-10-CM

## 2021-09-27 DIAGNOSIS — Z9889 Other specified postprocedural states: Secondary | ICD-10-CM | POA: Diagnosis not present

## 2021-09-27 DIAGNOSIS — Z20822 Contact with and (suspected) exposure to covid-19: Secondary | ICD-10-CM | POA: Diagnosis present

## 2021-09-27 DIAGNOSIS — A419 Sepsis, unspecified organism: Secondary | ICD-10-CM | POA: Diagnosis present

## 2021-09-27 DIAGNOSIS — K219 Gastro-esophageal reflux disease without esophagitis: Secondary | ICD-10-CM | POA: Diagnosis present

## 2021-09-27 DIAGNOSIS — K3184 Gastroparesis: Secondary | ICD-10-CM | POA: Diagnosis present

## 2021-09-27 DIAGNOSIS — Z992 Dependence on renal dialysis: Secondary | ICD-10-CM | POA: Diagnosis not present

## 2021-09-27 DIAGNOSIS — I10 Essential (primary) hypertension: Secondary | ICD-10-CM

## 2021-09-27 DIAGNOSIS — D65 Disseminated intravascular coagulation [defibrination syndrome]: Secondary | ICD-10-CM | POA: Diagnosis not present

## 2021-09-27 DIAGNOSIS — G928 Other toxic encephalopathy: Secondary | ICD-10-CM | POA: Diagnosis present

## 2021-09-27 DIAGNOSIS — T8571XA Infection and inflammatory reaction due to peritoneal dialysis catheter, initial encounter: Secondary | ICD-10-CM | POA: Diagnosis not present

## 2021-09-27 DIAGNOSIS — G2581 Restless legs syndrome: Secondary | ICD-10-CM | POA: Diagnosis not present

## 2021-09-27 DIAGNOSIS — Z79899 Other long term (current) drug therapy: Secondary | ICD-10-CM | POA: Diagnosis not present

## 2021-09-27 DIAGNOSIS — E782 Mixed hyperlipidemia: Secondary | ICD-10-CM | POA: Diagnosis present

## 2021-09-27 DIAGNOSIS — R34 Anuria and oliguria: Secondary | ICD-10-CM | POA: Diagnosis present

## 2021-09-27 DIAGNOSIS — I120 Hypertensive chronic kidney disease with stage 5 chronic kidney disease or end stage renal disease: Secondary | ICD-10-CM | POA: Diagnosis not present

## 2021-09-27 DIAGNOSIS — M898X9 Other specified disorders of bone, unspecified site: Secondary | ICD-10-CM | POA: Diagnosis present

## 2021-09-27 DIAGNOSIS — Z881 Allergy status to other antibiotic agents status: Secondary | ICD-10-CM | POA: Diagnosis not present

## 2021-09-27 DIAGNOSIS — Z885 Allergy status to narcotic agent status: Secondary | ICD-10-CM | POA: Diagnosis not present

## 2021-09-27 DIAGNOSIS — K659 Peritonitis, unspecified: Secondary | ICD-10-CM | POA: Diagnosis present

## 2021-09-27 DIAGNOSIS — M109 Gout, unspecified: Secondary | ICD-10-CM | POA: Diagnosis present

## 2021-09-27 DIAGNOSIS — R1084 Generalized abdominal pain: Principal | ICD-10-CM

## 2021-09-27 DIAGNOSIS — Z882 Allergy status to sulfonamides status: Secondary | ICD-10-CM | POA: Diagnosis not present

## 2021-09-27 DIAGNOSIS — T82898A Other specified complication of vascular prosthetic devices, implants and grafts, initial encounter: Secondary | ICD-10-CM | POA: Diagnosis not present

## 2021-09-27 DIAGNOSIS — N186 End stage renal disease: Secondary | ICD-10-CM | POA: Diagnosis not present

## 2021-09-27 HISTORY — DX: Dyspnea, unspecified: R06.00

## 2021-09-27 LAB — CBC
HCT: 35.6 % — ABNORMAL LOW (ref 36.0–46.0)
Hemoglobin: 12 g/dL (ref 12.0–15.0)
MCH: 36.1 pg — ABNORMAL HIGH (ref 26.0–34.0)
MCHC: 33.7 g/dL (ref 30.0–36.0)
MCV: 107.2 fL — ABNORMAL HIGH (ref 80.0–100.0)
Platelets: 349 10*3/uL (ref 150–400)
RBC: 3.32 MIL/uL — ABNORMAL LOW (ref 3.87–5.11)
RDW: 13.2 % (ref 11.5–15.5)
WBC: 14 10*3/uL — ABNORMAL HIGH (ref 4.0–10.5)
nRBC: 0 % (ref 0.0–0.2)

## 2021-09-27 LAB — COMPREHENSIVE METABOLIC PANEL
ALT: 25 U/L (ref 0–44)
AST: 29 U/L (ref 15–41)
Albumin: 3.3 g/dL — ABNORMAL LOW (ref 3.5–5.0)
Alkaline Phosphatase: 103 U/L (ref 38–126)
Anion gap: 17 — ABNORMAL HIGH (ref 5–15)
BUN: 52 mg/dL — ABNORMAL HIGH (ref 8–23)
CO2: 22 mmol/L (ref 22–32)
Calcium: 9.3 mg/dL (ref 8.9–10.3)
Chloride: 93 mmol/L — ABNORMAL LOW (ref 98–111)
Creatinine, Ser: 11.64 mg/dL — ABNORMAL HIGH (ref 0.44–1.00)
GFR, Estimated: 3 mL/min — ABNORMAL LOW (ref >60)
Glucose, Bld: 122 mg/dL — ABNORMAL HIGH (ref 70–99)
Potassium: 4.1 mmol/L (ref 3.5–5.1)
Sodium: 132 mmol/L — ABNORMAL LOW (ref 135–145)
Total Bilirubin: 0.5 mg/dL (ref 0.3–1.2)
Total Protein: 7.2 g/dL (ref 6.5–8.1)

## 2021-09-27 LAB — LIPASE, BLOOD: Lipase: 83 U/L — ABNORMAL HIGH (ref 11–51)

## 2021-09-27 MED ORDER — MORPHINE SULFATE (PF) 4 MG/ML IV SOLN
4.0000 mg | Freq: Once | INTRAVENOUS | Status: AC
  Administered 2021-09-28: 01:00:00 4 mg via INTRAVENOUS
  Filled 2021-09-27: qty 1

## 2021-09-27 MED ORDER — SODIUM CHLORIDE 0.9 % IV SOLN
1.0000 g | Freq: Once | INTRAVENOUS | Status: AC
  Administered 2021-09-28: 01:00:00 1 g via INTRAVENOUS
  Filled 2021-09-27: qty 20

## 2021-09-27 MED ORDER — ONDANSETRON HCL 4 MG/2ML IJ SOLN
4.0000 mg | Freq: Once | INTRAMUSCULAR | Status: AC
  Administered 2021-09-28: 01:00:00 4 mg via INTRAVENOUS
  Filled 2021-09-27: qty 2

## 2021-09-27 MED ORDER — OXYCODONE-ACETAMINOPHEN 5-325 MG PO TABS: 1.0000 | ORAL_TABLET | Freq: Once | ORAL | Status: DC

## 2021-09-27 MED ORDER — SODIUM CHLORIDE 0.9 % IV BOLUS: 1000.0000 mL | Freq: Once | INTRAVENOUS | Status: DC

## 2021-09-27 MED ORDER — VANCOMYCIN HCL IN DEXTROSE 1-5 GM/200ML-% IV SOLN
1000.0000 mg | Freq: Once | INTRAVENOUS | Status: AC
  Administered 2021-09-28: 01:00:00 1000 mg via INTRAVENOUS
  Filled 2021-09-27: qty 200

## 2021-09-27 NOTE — ED Notes (Signed)
RN attempted to start IV. RN unsuccessful. IV team consulted.  ?

## 2021-09-27 NOTE — ED Triage Notes (Signed)
Pt states that she began to have severe generalized abd pain tonight around 730, denies n/v/d. Pt does peritoneal dialysis as well with hx of peritonitis  ?

## 2021-09-27 NOTE — ED Provider Notes (Signed)
?[facility] ?Provider Note ? ? ?CSN: [id number] ?Arrival date & time: 09/27/21  2148 ? ?  ? ?History ? ?Chief Complaint  ?Patient presents with  ? Abdominal Pain  ? ? ?Felicia Williams is a 81 y.o. female. ? ?Patient is a 59 female with ESRD with peritoneal dialysis presenting for abdominal pain. Patient admits to generalized abdominal pain x 4 hours, severe, associated with nausea and vomiting. Admits to worse pain in the lower abdomen. Does not produce urine at baseline. Recent admission with DC two weeks ago for peritonitis.  ? ?The history is provided by the patient. No language interpreter was used.  ?Abdominal Pain ?Associated symptoms: nausea and vomiting   ?Associated symptoms: no chest pain, no chills, no cough, no dysuria, no fever, no hematuria, no shortness of breath and no sore throat   ? ?  ? ?Home Medications ?Prior to Admission medications   ?Medication Sig Start Date End Date Taking? Authorizing Provider  ?acetaminophen (TYLENOL) 325 MG tablet Take 2 tablets (650 mg total) by mouth every 6 (six) hours as needed for mild pain. 09/12/21   Love, Ivan Anchors, PA-C  ?camphor-menthol [facility]) lotion Apply topically as needed for itching. 09/12/21   Love, Ivan Anchors, PA-C  ?famotidine (PEPCID) 20 MG tablet Take 1 tablet (20 mg total) by mouth at bedtime. ?Patient not taking: Reported on 08/27/2021 11/14/20   Rogene Houston, MD  ?gentamicin cream (GARAMYCIN) 0.1 % Apply 1 application topically daily. 09/08/21   Mercy Riding, MD  ?hydrOXYzine (ATARAX) 10 MG tablet Take 1 tablet (10 mg total) by mouth 3 (three) times daily as needed for itching. 09/12/21   Love, Ivan Anchors, PA-C  ?loperamide (IMODIUM) 2 MG capsule Take 1 capsule (2 mg total) by mouth as needed for diarrhea or loose stools. 09/12/21   Bary Leriche, PA-C  ?multivitamin (RENA-VIT) TABS tablet Take 1 tablet by mouth at bedtime. 01/20/19   Nita Sells, MD  ?sevelamer carbonate (RENVELA) 2.4 g PACK Take 2.4 g by  mouth 3 (three) times daily with meals. 09/12/21   Love, Ivan Anchors, PA-C  ?traZODone (DESYREL) 50 MG tablet Take 0.5-1 tablets (25-50 mg total) by mouth at bedtime as needed for sleep. 09/12/21   Bary Leriche, PA-C  ?   ? ?Allergies    ?Cefepime and Sulfa antibiotics   ? ?Review of Systems   ?Review of Systems  ?Constitutional:  Negative for chills and fever.  ?HENT:  Negative for ear pain and sore throat.   ?Eyes:  Negative for pain and visual disturbance.  ?Respiratory:  Negative for cough and shortness of breath.   ?Cardiovascular:  Negative for chest pain and palpitations.  ?Gastrointestinal:  Positive for abdominal pain, nausea and vomiting.  ?Genitourinary:  Negative for dysuria and hematuria.  ?Musculoskeletal:  Negative for arthralgias and back pain.  ?Skin:  Negative for color change and rash.  ?Neurological:  Negative for seizures and syncope.  ?All other systems reviewed and are negative. ? ?Physical Exam ?Updated Vital Signs ?BP 137/81 (BP Location: Right Arm)   Pulse (!) 107   Temp 98.5 ?F (36.9 ?C) (Oral)   Resp 18   SpO2 94%  ?Physical Exam ?Vitals and nursing note reviewed.  ?Constitutional:   ?   General: She is not in acute distress. ?   Appearance: She is well-developed.  ?HENT:  ?   Head: Normocephalic and atraumatic.  ?Eyes:  ?   Conjunctiva/sclera: Conjunctivae normal.  ?Cardiovascular:  ?  Rate and Rhythm: Normal rate and regular rhythm.  ?   Heart sounds: No murmur heard. ?Pulmonary:  ?   Effort: Pulmonary effort is normal. No respiratory distress.  ?   Breath sounds: Normal breath sounds.  ?Abdominal:  ?   Palpations: Abdomen is soft.  ?   Tenderness: There is generalized abdominal tenderness and tenderness in the suprapubic area. There is guarding. There is no rebound.  ?Musculoskeletal:     ?   General: No swelling.  ?   Cervical back: Neck supple.  ?Skin: ?   General: Skin is warm and dry.  ?   Capillary Refill: Capillary refill takes less than 2 seconds.  ?Neurological:  ?   Mental  Status: She is alert.  ?Psychiatric:     ?   Mood and Affect: Mood normal.  ? ? ?ED Results / Procedures / Treatments   ?Labs ?(all labs ordered are listed, but only abnormal results are displayed) ?Labs Reviewed  ?CBC - Abnormal; Notable for the following components:  ?    Result Value  ? WBC 14.0 (*)   ? RBC 3.32 (*)   ? HCT 35.6 (*)   ? MCV 107.2 (*)   ? MCH 36.1 (*)   ? All other components within normal limits  ?LIPASE, BLOOD  ?COMPREHENSIVE METABOLIC PANEL  ?URINALYSIS, ROUTINE W REFLEX MICROSCOPIC  ? ? ?EKG ?None ? ?Radiology ?No results found. ? ?Procedures ?Procedures  ? ? ?Medications Ordered in ED ?Medications  ?oxyCODONE-acetaminophen (PERCOCET/ROXICET) 5-325 MG per tablet 1 tablet (has no administration in time range)  ? ? ?ED Course/ Medical Decision Making/ A&P ?  ?                        ?Medical Decision Making ? ?10:55 PM ? 45 female with ESRD with peritoneal dialysis presenting for abdominal pain. Patient is Aox3, no acute distress, afebrile, with tachycardia of 107 bpm with otherwise stable vitals. Physical exam demonstrates soft abdomen with tenderness of suprapubic region. Negative fluid wave.  ? ?Chart review demonstrates recent admission for sepsis with peritonitis. Gram stain at that time on 2/04 demonstrating: ?WBC PRESENT, PREDOMINANTLY PMN  ?GRAM POSITIVE COCCI  ?CYTOSPIN SMEAR  ?Cultures: no growth x 5 days  ? ?Morphine and zofran given for pain. IVF given.  ?Laboratory studies demonstrate leukocytosis elevated from recent discharge. Otherwise labs are consistent with previous studies.  ? ?CT abdomen/pelvis demonstrates no other causes of abdominal pain. No acute process seen.  ? ?Recommendations for gram stain and culture of peritoneal fluid after dialysis.  ? ?Recommendations for admission at this time due to concerns for repeat SBP. Consult to pharmacy for antibiotic recommendations due to toxic encephalopathy to cephalosporin during last admission. Pharmacy recommended meropenum bc  that's what pt had during last admission. Meropenum started.   ? ?Patient accepted by admitting physician Dr. Flossie Buffy.  ? ? ? ? ? ? ? ? ? ? ?Final Clinical Impression(s) / ED Diagnoses ?Final diagnoses:  ?Generalized abdominal pain  ?History of peritoneal dialysis  ?ESRD on dialysis [facility])  ?Peritonitis ([place])  ? ? ?Rx / DC Orders ?ED Discharge Orders   ? ? None  ? ?  ? ? ?  ?Lianne Cure, DO ?00/93/81 2341 ? ?

## 2021-09-27 NOTE — ED Provider Triage Note (Signed)
Emergency Medicine Provider Triage Evaluation Note ? ?Felicia Williams , a 81 y.o. female  was evaluated in triage.  Pt complains of cute onset abdominal pain at 730 this evening.  Patient reports that she had some nausea and vomiting yesterday but not today.  She denies any feeling of fever, chills.  She is on peritoneal dialysis has had peritonitis twice.  She was recently discharged from the hospital 2 weeks ago for peritonitis and a cefepime reaction.  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  Patient denies any diarrhea at this time, she denies chest pain, shortness of breath. ? ?Review of Systems  ?Positive: Abdominal pain ?Negative: Chest pain, shob ? ?Physical Exam  ?BP 137/81 (BP Location: Right Arm)   Pulse (!) 107   Temp 98.5 ?F (36.9 ?C) (Oral)   Resp 18   SpO2 94%  ?Gen:   Awake, no distress   ?Resp:  Normal effort  ?MSK:   Moves extremities without difficulty  ?Other:  Ttp throughout the abdomen, no redness noted ? ?Medical Decision Making  ?Medically screening exam initiated at 10:00 PM.  Appropriate orders placed.  Felicia Williams was informed that the remainder of the evaluation will be completed by another provider, this initial triage assessment does not replace that evaluation, and the importance of remaining in the ED until their evaluation is complete. ? ?Workup initiated ?  ?Felicia Pickler, PA-C ?09/27/21 2202 ? ?

## 2021-09-28 ENCOUNTER — Other Ambulatory Visit: Payer: Self-pay

## 2021-09-28 DIAGNOSIS — K659 Peritonitis, unspecified: Secondary | ICD-10-CM | POA: Diagnosis not present

## 2021-09-28 LAB — CBC
HCT: 31 % — ABNORMAL LOW (ref 36.0–46.0)
Hemoglobin: 10.4 g/dL — ABNORMAL LOW (ref 12.0–15.0)
MCH: 35.1 pg — ABNORMAL HIGH (ref 26.0–34.0)
MCHC: 33.5 g/dL (ref 30.0–36.0)
MCV: 104.7 fL — ABNORMAL HIGH (ref 80.0–100.0)
Platelets: 272 10*3/uL (ref 150–400)
RBC: 2.96 MIL/uL — ABNORMAL LOW (ref 3.87–5.11)
RDW: 13.4 % (ref 11.5–15.5)
WBC: 12.2 10*3/uL — ABNORMAL HIGH (ref 4.0–10.5)
nRBC: 0 % (ref 0.0–0.2)

## 2021-09-28 LAB — BASIC METABOLIC PANEL
Anion gap: 17 — ABNORMAL HIGH (ref 5–15)
BUN: 53 mg/dL — ABNORMAL HIGH (ref 8–23)
CO2: 21 mmol/L — ABNORMAL LOW (ref 22–32)
Calcium: 8.5 mg/dL — ABNORMAL LOW (ref 8.9–10.3)
Chloride: 96 mmol/L — ABNORMAL LOW (ref 98–111)
Creatinine, Ser: 11.46 mg/dL — ABNORMAL HIGH (ref 0.44–1.00)
GFR, Estimated: 3 mL/min — ABNORMAL LOW (ref >60)
Glucose, Bld: 107 mg/dL — ABNORMAL HIGH (ref 70–99)
Potassium: 4.6 mmol/L (ref 3.5–5.1)
Sodium: 134 mmol/L — ABNORMAL LOW (ref 135–145)

## 2021-09-28 LAB — RESP PANEL BY RT-PCR (FLU A&B, COVID) ARPGX2
Influenza A by PCR: NEGATIVE
Influenza B by PCR: NEGATIVE
SARS Coronavirus 2 by RT PCR: NEGATIVE

## 2021-09-28 LAB — BODY FLUID CELL COUNT WITH DIFFERENTIAL
Eos, Fluid: 0 %
Lymphs, Fluid: 1 %
Monocyte-Macrophage-Serous Fluid: 6 % — ABNORMAL LOW (ref 50–90)
Neutrophil Count, Fluid: 93 % — ABNORMAL HIGH (ref 0–25)
Total Nucleated Cell Count, Fluid: 10290 cu mm — ABNORMAL HIGH (ref 0–1000)

## 2021-09-28 LAB — PHOSPHORUS: Phosphorus: 6 mg/dL — ABNORMAL HIGH (ref 2.5–4.6)

## 2021-09-28 MED ORDER — SEVELAMER CARBONATE 2.4 G PO PACK
2.4000 g | PACK | Freq: Three times a day (TID) | ORAL | Status: DC
  Administered 2021-09-28 – 2021-10-03 (×12): 2.4 g via ORAL
  Filled 2021-09-28 (×16): qty 1

## 2021-09-28 MED ORDER — SODIUM CHLORIDE 0.9 % IV BOLUS: 250.0000 mL | Freq: Once | INTRAVENOUS | Status: DC

## 2021-09-28 MED ORDER — DELFLEX-LC/2.5% DEXTROSE 394 MOSM/L IP SOLN: INTRAPERITONEAL | Status: DC

## 2021-09-28 MED ORDER — PROCHLORPERAZINE EDISYLATE 10 MG/2ML IJ SOLN
10.0000 mg | Freq: Four times a day (QID) | INTRAMUSCULAR | Status: DC | PRN
  Administered 2021-09-28 – 2021-09-29 (×2): 10 mg via INTRAVENOUS
  Filled 2021-09-28 (×2): qty 2

## 2021-09-28 MED ORDER — MORPHINE SULFATE (PF) 2 MG/ML IV SOLN: 1.0000 mg | INTRAVENOUS | Status: DC | PRN

## 2021-09-28 MED ORDER — ONDANSETRON HCL 4 MG/2ML IJ SOLN
4.0000 mg | Freq: Four times a day (QID) | INTRAMUSCULAR | Status: DC | PRN
Start: 2021-09-28 — End: 2021-09-28

## 2021-09-28 MED ORDER — TRAZODONE HCL 50 MG PO TABS
25.0000 mg | ORAL_TABLET | Freq: Every evening | ORAL | Status: DC | PRN
  Administered 2021-09-28 – 2021-10-02 (×3): 50 mg via ORAL
  Filled 2021-09-28 (×4): qty 1

## 2021-09-28 MED ORDER — GENTAMICIN SULFATE 0.1 % EX CREA
1.0000 "application " | TOPICAL_CREAM | Freq: Every day | CUTANEOUS | Status: DC
  Administered 2021-09-28 – 2021-09-29 (×3): 1 via TOPICAL
  Filled 2021-09-28 (×2): qty 15

## 2021-09-28 MED ORDER — DELFLEX-LC/1.5% DEXTROSE 344 MOSM/L IP SOLN: INTRAPERITONEAL | Status: DC

## 2021-09-28 MED ORDER — ENOXAPARIN SODIUM 30 MG/0.3ML IJ SOSY
30.0000 mg | PREFILLED_SYRINGE | INTRAMUSCULAR | Status: DC
  Administered 2021-09-28 – 2021-10-01 (×3): 30 mg via SUBCUTANEOUS
  Filled 2021-09-28 (×3): qty 0.3

## 2021-09-28 MED ORDER — FENTANYL CITRATE PF 50 MCG/ML IJ SOSY
50.0000 ug | PREFILLED_SYRINGE | INTRAMUSCULAR | Status: DC | PRN
  Administered 2021-09-28 – 2021-09-29 (×2): 50 ug via INTRAVENOUS
  Filled 2021-09-28 (×3): qty 1

## 2021-09-28 MED ORDER — HYDROXYZINE HCL 10 MG PO TABS
10.0000 mg | ORAL_TABLET | Freq: Three times a day (TID) | ORAL | Status: DC | PRN
  Administered 2021-09-28 – 2021-09-29 (×2): 10 mg via ORAL
  Filled 2021-09-28 (×3): qty 1

## 2021-09-28 MED ORDER — FENTANYL CITRATE PF 50 MCG/ML IJ SOSY
25.0000 ug | PREFILLED_SYRINGE | INTRAMUSCULAR | Status: DC | PRN
  Administered 2021-09-28 (×2): 25 ug via INTRAVENOUS
  Filled 2021-09-28 (×2): qty 1

## 2021-09-28 MED ORDER — SODIUM CHLORIDE 0.9 % IV BOLUS
500.0000 mL | Freq: Once | INTRAVENOUS | Status: AC
  Administered 2021-09-28: 01:00:00 500 mL via INTRAVENOUS

## 2021-09-28 MED ORDER — VANCOMYCIN VARIABLE DOSE PER UNSTABLE RENAL FUNCTION (PHARMACIST DOSING): Status: DC

## 2021-09-28 MED ORDER — HYDROCODONE-ACETAMINOPHEN 10-325 MG PO TABS: 0.5000 | ORAL_TABLET | Freq: Two times a day (BID) | ORAL | Status: DC | PRN

## 2021-09-28 MED ORDER — SODIUM CHLORIDE 0.9 % IV SOLN
500.0000 mg | INTRAVENOUS | Status: DC
  Administered 2021-09-29 – 2021-10-01 (×3): 500 mg via INTRAVENOUS
  Filled 2021-09-28 (×6): qty 10

## 2021-09-28 MED ORDER — ACETAMINOPHEN 325 MG PO TABS
650.0000 mg | ORAL_TABLET | Freq: Four times a day (QID) | ORAL | Status: DC | PRN
  Administered 2021-09-28 – 2021-10-03 (×4): 650 mg via ORAL
  Filled 2021-09-28 (×4): qty 2

## 2021-09-28 NOTE — ED Notes (Signed)
RN unable to obtain blood cultures. Blood cultures ordered post administration of 1st antibiotic. ?

## 2021-09-28 NOTE — Hospital Course (Addendum)
Felicia Williams is a 81 y.o. female with medical history significant of ESRD on PD, HTN, GERD who presents with abdominal pain.  She recently had prolonged hospitalization from 2/4 to 2/16 for sepsis secondary to peritonitis.  She had gram-positive cocci on Gram stain but fluid and blood culture were negative.  She was initially treated with cefepime but developed acute metabolic encephalopathy, myoclonic jerks with toxic metabolic encephalopathy seen on LTM EEG.  She was transitioned to 2 weeks of IV meropenem instead per ID.  She completed her course of antibiotics on 2/17. ?When she left rehab she was starting to have pain in her stomach again.  PD catheter removed and plan is now for HD.  Access placed.  Got first HD 3/11 now needs to be CLIPPED, will also need IV vanc with HD for 2 weeks from 3/11. ?

## 2021-09-28 NOTE — Assessment & Plan Note (Signed)
Continue antihypertensives once med rec is complete ?

## 2021-09-28 NOTE — Consult Note (Signed)
Renal Service ?Consult Note ?[facility] ? ?Felicia Williams ?09/28/2021 ?Sol Blazing, MD ?Requesting Physician: Dr. Eliseo Squires ? ?Reason for Consult: ESRD pt on PD w/ recurrent abd pain ?HPI: The patient is a 81 y.o. year-old w/ hx of HTN, gout, HL, and ESRD on PD in [place], [place], who presented w/ c/o generalized abd pain ongoing for about 1 week, w/ hx of recent PD cath related peritonitis treated in Feb 2022 in the hospital at [facility]. In ED pt afeb w/ WBC 14k, labs o/w okay. CT abd was negative. She was given IV vanc/ meropenem. We are consulted for ESRD.   ? ?Pt seen in ED.  Abd pain started around 6 days ago last Friday. She thought at 1st it was due to her exercising w/ the PT folks. Abd pain not localized and has been steady, 8/10.  No n/v or diarreha, no fevers or chills or sweats.  Has not had any trouble w/ her PD exchanges.  ? ?Recent admit 2/4 - 2/21 >> pt had sepsis and peritonitis. TNC was 8,667 initially on 2/4. She had gram-positive cocci on Gram stain but PD fluid and blood cultures were negative.  She was initially treated with cefepime but developed encephalopathy w/ myoclonus so was transitioned to 2 weeks of IV meropenem instead per ID. F/u TNC on 08/30/21 was down to 11 and abd pain had resolved. She completed her course of antibiotics on 2/17.  ? ?ROS - denies CP, no joint pain, no HA, no blurry vision, no rash, no diarrhea, no nausea/ vomiting, no dysuria, no difficulty voiding ? ? ?Past Medical History  ?Past Medical History:  ?Diagnosis Date  ? Anemia in stage 4 chronic kidney disease ([place]) 08/18/2018  ? 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 Depression   ? Essential hypertension, benign   ? Gastric polyps   ? Gastroparesis   ? followed by Dr. Laural Golden.  ? GERD (gastroesophageal reflux disease)   ? Glomerulonephritis   ? Gout   ? Mixed hyperlipidemia   ? Renal insufficiency   ? Spondylosis   ? ?Past Surgical History  ?Past Surgical History:  ?Procedure Laterality Date   ? APPENDECTOMY    ? CATARACT EXTRACTION    ? COLONOSCOPY N/A 12/28/2015  ? Procedure: COLONOSCOPY;  Surgeon: Rogene Houston, MD;  [facility];  Service: Endoscopy;  Laterality: N/A;  815  ? ESOPHAGOGASTRODUODENOSCOPY N/A 11/16/2020  ? Procedure: ESOPHAGOGASTRODUODENOSCOPY (EGD);  Surgeon: Rogene Houston, MD;  Location: [facility];  Service: Endoscopy;  Laterality: N/A;  1:15  ? IR FLUORO GUIDE CV LINE RIGHT  01/16/2019  ? IR REMOVAL TUN CV CATH W/O FL  07/01/2019  ? IR US GUIDE VASC ACCESS RIGHT  01/16/2019  ? POLYPECTOMY  11/16/2020  ? Procedure: POLYPECTOMY;  Surgeon: Rogene Houston, MD;  Location: [facility];  Service: Endoscopy;;  gastric  ? TUBAL LIGATION    ? ?Family History  ?Family History  ?Problem Relation Age of Onset  ? CAD Father   ? Heart attack Father   ? Diabetes Mellitus II Father   ? Hypertension Father   ? Lupus Brother   ? ?Social History  reports that she has never smoked. She has never been exposed to tobacco smoke. She has never used smokeless tobacco. She reports that she does not drink alcohol and does not use drugs. ?Allergies  ?Allergies  ?Allergen Reactions  ? Cefepime Other (See Comments)  ?  Feb 2023  Encephalopathy with questionable seizures. Seems to be tolerating cefazolin   ? Morphine   ?  "Dry heaving like crazy"  ? Sulfa Antibiotics Other (See Comments)  ?  Shut pt's kidneys down  ? ?Home medications ?Prior to Admission medications   ?Medication Sig Start Date End Date Taking? Authorizing Provider  ?acetaminophen (TYLENOL) 325 MG tablet Take 2 tablets (650 mg total) by mouth every 6 (six) hours as needed for mild pain. 09/12/21  Yes Love, Ivan Anchors, PA-C  ?camphor-menthol [facility]) lotion Apply topically as needed for itching. 09/12/21  Yes Love, Ivan Anchors, PA-C  ?HYDROcodone-acetaminophen (NORCO) 10-325 MG tablet Take 0.5 tablets by mouth 2 (two) times daily as needed for moderate pain. 09/20/21  Yes [provider]  ?hydrOXYzine (ATARAX) 10 MG tablet Take 1  tablet (10 mg total) by mouth 3 (three) times daily as needed for itching. 09/12/21  Yes Love, Ivan Anchors, PA-C  ?loperamide (IMODIUM) 2 MG capsule Take 1 capsule (2 mg total) by mouth as needed for diarrhea or loose stools. 09/12/21  Yes Love, Ivan Anchors, PA-C  ?multivitamin (RENA-VIT) TABS tablet Take 1 tablet by mouth at bedtime. 01/20/19  Yes Nita Sells, MD  ?sevelamer carbonate (RENVELA) 2.4 g PACK Take 2.4 g by mouth 3 (three) times daily with meals. 09/12/21  Yes Love, Ivan Anchors, PA-C  ?traZODone (DESYREL) 50 MG tablet Take 0.5-1 tablets (25-50 mg total) by mouth at bedtime as needed for sleep. 09/12/21  Yes Love, Ivan Anchors, PA-C  ?famotidine (PEPCID) 20 MG tablet Take 1 tablet (20 mg total) by mouth at bedtime. ?Patient not taking: Reported on 08/27/2021 11/14/20   Rogene Houston, MD  ?gentamicin cream (GARAMYCIN) 0.1 % Apply 1 application topically daily. ?Patient not taking: Reported on 09/28/2021 09/08/21   Mercy Riding, MD  ? ? ? ?Vitals:  ? 09/28/21 0430 09/28/21 0500 09/28/21 0800 09/28/21 1000  ?BP: 123/63 106/62 (!) 151/70 126/60  ?Pulse: 80 76 94 91  ?Resp: (!) 23 (!) 24 (!) 27 (!) 23  ?Temp:      ?TempSrc:      ?SpO2: 96% 96% 100% 95%  ? ?Exam ?Gen alert, no distress ?No rash, cyanosis or gangrene ?Sclera anicteric, throat clear  ?No jvd or bruits ?Chest clear bilat to bases, no rales/ wheezing ?RRR no MRG ?Abd soft minimally tender, PD cath intact w/ clean exit, no mass or ascites +bs ?GU defer ?MS no joint effusions or deformity ?Ext no LE or UE edema, no wounds or ulcers ?Neuro is alert, Ox 3 , nf ? ? Home meds include - norco prn, renvela 3 ac tid, rena vit, trazodone, pepcid ? ? ? OP PD: 5 exchanges overnight, 2.5 L vol, usually hangs 2- 6L 2.5% bags and 1 - 3 L 1.5% bag. Does last fill w/ icodextrin for daybag.  Dry wt 57kg. ?  ?   Na 134  K 4.6  CO2 21  BUN 53  Cr 11  Ca 8.5  WBC 12k Hb 10.4 ? ? ?Assessment/ Plan: ?Abdominal pain - w/ hx or recent culture neg peritonitis in Feb 2023. Fluid  collected this am and cell ct and culture sent off. Getting empiric IV abx.  ?ESRD - on [facility] in [place], [place]. Cont nightly PD here as above.  ?HTN/ vol - BP's wnl, euvolemic on exam. Use mix of 1.5% and 2.5% bags tonight.  ?Anemia ckd - Hb okay, no esa needs ?MBD ckd - Ca in range. Cont binder, add on phos.  ?  ? ? ? ?  Kelly Splinter  MD ?09/28/2021, 11:02 AM ?Recent Labs  ?Lab 09/27/21 ?[id number] 09/28/21 ?0436  ?HGB 12.0 10.4*  ?ALBUMIN 3.3*  --   ?CALCIUM 9.3 8.5*  ?CREATININE 11.64* 11.46*  ?K 4.1 4.6  ? ? ?

## 2021-09-28 NOTE — Assessment & Plan Note (Addendum)
-  nephrology consult ?-PD catheter removed ?-needs HD ?-getting clipped, also will get IV abx (vanc) for 2 weeks with HD ? ?

## 2021-09-28 NOTE — Assessment & Plan Note (Addendum)
secondary to bacterial peritonitis ?-Presented with tachycardia and leukocytosis.  Negative CT abdomen and pelvis finding w/hx of ESRD on PD.  ?-pt had toxic metabolic encephalopathy thought to be due to cefepime and previous hospitalization ?-Continue IV vancomycin and meropenem- narrow to vanc and plan 2 weeks from 3/11 ?-obtain blood and peritoneal fluid culture- done 3/9 after IV abx-- some staph epi growing ?-pain much improved ? ? ? ?

## 2021-09-28 NOTE — H&P (Addendum)
?History and Physical  ? ? ?Patient: Felicia Williams PFX:[id number] DOB: 1940-08-17 ?DOA: 09/27/2021 ?DOS: the patient was seen and examined on 09/28/2021 ?PCP: [name], NP  ?Patient coming from: Home ? ?Chief Complaint:  ?Chief Complaint  ?Patient presents with  ? Abdominal Pain  ? ?HPI: Felicia Williams is a 81 y.o. female with medical history significant of ESRD on PD, HTN, GERD who presents with abdominal pain. ? ?Pt notes acute diffuse abdominal pain earlier this evening prior to her peritoneal dialysis.  Denies any nausea, vomiting or diarrhea.  No fevers or chills.  She recently had prolonged hospitalization from 2/4 to 2/16 for sepsis secondary to peritonitis.  She had gram-positive cocci on Gram stain but fluid and blood culture were negative.  She was initially treated with cefepime but developed acute metabolic encephalopathy, myoclonic jerks with toxic metabolic encephalopathy seen on LTM EEG.  She was transitioned to 2 weeks of IV meropenem instead per ID.  She completed her course of antibiotics on 2/17. ? ?In the ED, she was afebrile but tachycardic normotensive on room air.  Had leukocytosis of 14 K, BMP otherwise stable with anion gap of 17.Lipase elevated 83 but appears to be chronically elevated. ? ?CT of the abdomen pelvis was negative for any acute process. ? ?Per pharmacy, she was initiated on IV vancomycin and IV meropenem.  Nephrology was consulted by ED physician to assist with dialysis and fluid culture in the morning. ? ? ?Review of Systems: As mentioned in the history of present illness. All other systems reviewed and are negative. ?Past Medical History:  ?Diagnosis Date  ? Anemia in stage 4 chronic kidney disease ([place]) 08/18/2018  ? 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 Depression   ? Essential hypertension, benign   ? Gastric polyps   ? Gastroparesis   ? followed by Dr. Laural Golden.  ? GERD (gastroesophageal reflux disease)   ? Glomerulonephritis   ? Gout   ? Mixed  hyperlipidemia   ? Renal insufficiency   ? Spondylosis   ? ?Past Surgical History:  ?Procedure Laterality Date  ? APPENDECTOMY    ? CATARACT EXTRACTION    ? COLONOSCOPY N/A 12/28/2015  ? Procedure: COLONOSCOPY;  Surgeon: Rogene Houston, MD;  [facility];  Service: Endoscopy;  Laterality: N/A;  815  ? ESOPHAGOGASTRODUODENOSCOPY N/A 11/16/2020  ? Procedure: ESOPHAGOGASTRODUODENOSCOPY (EGD);  Surgeon: Rogene Houston, MD;  [facility];  Service: Endoscopy;  Laterality: N/A;  1:15  ? IR FLUORO GUIDE CV LINE RIGHT  01/16/2019  ? IR REMOVAL TUN CV CATH W/O FL  07/01/2019  ? IR US GUIDE VASC ACCESS RIGHT  01/16/2019  ? POLYPECTOMY  11/16/2020  ? Procedure: POLYPECTOMY;  Surgeon: Rogene Houston, MD;  [facility];  Service: Endoscopy;;  gastric  ? TUBAL LIGATION    ? ?Social History:  reports that she has never smoked. She has never been exposed to tobacco smoke. She has never used smokeless tobacco. She reports that she does not drink alcohol and does not use drugs. ? ?Allergies  ?Allergen Reactions  ? Cefepime Other (See Comments)  ?  Feb 2023 Encephalopathy with questionable seizures. Seems to be tolerating cefazolin   ? Sulfa Antibiotics Other (See Comments)  ?  Shut pt's kidneys down  ? ? ?Family History  ?Problem Relation Age of Onset  ? CAD Father   ? Heart attack Father   ? Diabetes Mellitus II Father   ? Hypertension  Father   ? Lupus Brother   ? ? ?Prior to Admission medications   ?Medication Sig Start Date End Date Taking? Authorizing Provider  ?acetaminophen (TYLENOL) 325 MG tablet Take 2 tablets (650 mg total) by mouth every 6 (six) hours as needed for mild pain. 09/12/21   Love, Ivan Anchors, PA-C  ?camphor-menthol [facility]) lotion Apply topically as needed for itching. 09/12/21   Love, Ivan Anchors, PA-C  ?famotidine (PEPCID) 20 MG tablet Take 1 tablet (20 mg total) by mouth at bedtime. ?Patient not taking: Reported on 08/27/2021 11/14/20   Rogene Houston, MD  ?gentamicin cream (GARAMYCIN)  0.1 % Apply 1 application topically daily. 09/08/21   Mercy Riding, MD  ?hydrOXYzine (ATARAX) 10 MG tablet Take 1 tablet (10 mg total) by mouth 3 (three) times daily as needed for itching. 09/12/21   Love, Ivan Anchors, PA-C  ?loperamide (IMODIUM) 2 MG capsule Take 1 capsule (2 mg total) by mouth as needed for diarrhea or loose stools. 09/12/21   Bary Leriche, PA-C  ?multivitamin (RENA-VIT) TABS tablet Take 1 tablet by mouth at bedtime. 01/20/19   Nita Sells, MD  ?sevelamer carbonate (RENVELA) 2.4 g PACK Take 2.4 g by mouth 3 (three) times daily with meals. 09/12/21   Love, Ivan Anchors, PA-C  ?traZODone (DESYREL) 50 MG tablet Take 0.5-1 tablets (25-50 mg total) by mouth at bedtime as needed for sleep. 09/12/21   Bary Leriche, PA-C  ? ? ?Physical Exam: ?Vitals:  ? 09/27/21 2152 09/27/21 2330 09/27/21 2345  ?BP: 137/81 (!) 149/60 136/64  ?Pulse: (!) 107 88 87  ?Resp: 18 (!) 21 17  ?Temp: 98.5 ?F (36.9 ?C)    ?TempSrc: Oral    ?SpO2: 94% 98% 97%  ? ?Constitutional: NAD, calm, comfortable, chronically ill-appearing ?Eyes: lids and conjunctivae normal ?ENMT: Mucous membranes are moist. ?Neck: normal, supple ?Respiratory: clear to auscultation bilaterally, no wheezing, no crackles. Normal respiratory effort.   ?Cardiovascular: Regular rate and rhythm, no murmurs / rubs / gallops. No extremity edema.  ?Abdomen: tense but non distended and diffusely tender abdomen. No rebound tenderness, guarding or rigidity.  Bowel sounds positive.  Peritoneal dialysis catheter in place without any erythema around entry site ?Musculoskeletal: no clubbing / cyanosis. No joint deformity upper and lower extremities. Good ROM, no contractures. Normal muscle tone.  ?Skin: no rashes, lesions, ulcers.  ?Neurologic: CN 2-12 grossly intact. Strength 5/5 in all 4.  ?Psychiatric: Normal judgment and insight. Alert and oriented x 3. Normal mood. ?Data Reviewed: ? ?See HPI ? ?Assessment and Plan: ?Sepsis ([place]) ?secondary to bacterial  peritonitis ?-Presented with tachycardia and leukocytosis.  Negative CT abdomen and pelvis finding w/hx of ESRD on PD.  ?-pt had toxic metabolic encephalopathy thought to be due to cefepime and previous hospitalization ?-Continue IV vancomycin and meropenem.   ?-obtain blood and peritoneal fluid culture ?-Give small bolus of fluid since she is anuric with ESRD  ? ? ? ?ESRD on dialysis [facility]) ?-no urgent need for dialysis overnight ?-neprology consulted  ? ? ?Benign essential hypertension ?Continue antihypertensives once med rec is complete ? ? ? ? ? Advance Care Planning:   Code Status: Full Code  ? ?Consults: nephrology ? ?Family Communication: Discussed with daughter at bedside ? ?Severity of Illness: ?The appropriate patient status for this patient is INPATIENT. Inpatient status is judged to be reasonable and necessary in order to provide the required intensity of service to ensure the patient's safety. The patient's presenting symptoms, physical exam findings, and initial radiographic  and laboratory data in the context of their chronic comorbidities is felt to place them at high risk for further clinical deterioration. Furthermore, it is not anticipated that the patient will be medically stable for discharge from the hospital within 2 midnights of admission.  ? ?* I certify that at the point of admission it is my clinical judgment that the patient will require inpatient hospital care spanning beyond 2 midnights from the point of admission due to high intensity of service, high risk for further deterioration and high frequency of surveillance required.* ? ?Author: Orene Desanctis, DO ?09/28/2021 12:55 AM ? ?For on call review [email or web address].  ?

## 2021-09-28 NOTE — Progress Notes (Signed)
PROGRESS NOTE    Felicia Williams  YTK:[id number] DOB: [birth year] DOA: 09/27/2021 PCP: [name], NP    Brief Narrative:  Felicia Williams is a 81 y.o. female with medical history significant of ESRD on PD, HTN, GERD who presents with abdominal pain.  She recently had prolonged hospitalization from 2/4 to 2/16 for sepsis secondary to peritonitis.  She had gram-positive cocci on Gram stain but fluid and blood culture were negative.  She was initially treated with cefepime but developed acute metabolic encephalopathy, myoclonic jerks with toxic metabolic encephalopathy seen on LTM EEG.  She was transitioned to 2 weeks of IV meropenem instead per ID.  She completed her course of antibiotics on 2/17.    Assessment and Plan: Sepsis ([place]) secondary to bacterial peritonitis -Presented with tachycardia and leukocytosis.  Negative CT abdomen and pelvis finding w/hx of ESRD on PD.  -pt had toxic metabolic encephalopathy thought to be due to cefepime and previous hospitalization -Continue IV vancomycin and meropenem.   -obtain blood and peritoneal fluid culture- done 3/9 after IV abx     ESRD on dialysis ([place]) -no urgent need for dialysis overnight -neprology consulted           DVT prophylaxis: enoxaparin (LOVENOX) injection 30 mg Start: 09/28/21 1000 SCDs Start: 09/28/21 0036    Code Status: Full Code   Disposition Plan:  Level of care: Telemetry Medical Status is: Inpatient Remains inpatient appropriate because: needs IV abx    Consultants:  nephrology     Subjective: Still with abdominal pain  Objective: Vitals:   09/28/21 0500 09/28/21 0800 09/28/21 1000 09/28/21 1100  BP: 106/62 (!) 151/70 126/60 (!) 125/57  Pulse: 76 94 91 84  Resp: (!) 24 (!) 27 (!) 23 (!) 21  Temp:      TempSrc:      SpO2: 96% 100% 95% 98%    Intake/Output Summary (Last 24 hours) at 09/28/2021 1203 Last data filed at 09/28/2021 0228 Gross per 24 hour  Intake 700 ml  Output --   Net 700 ml    Examination:   General: Appearance:    Well developed, well nourished female in no acute distress     Lungs:      respirations unlabored  Heart:    Normal heart rate.     MS:   All extremities are intact.    Neurologic:   Awake, alert, oriented x 3. No apparent focal neurological           defect.        Data Reviewed: I have personally reviewed following labs and imaging studies  CBC: Recent Labs  Lab 09/27/21 2222 09/28/21 0436  WBC 14.0* 12.2*  HGB 12.0 10.4*  HCT 35.6* 31.0*  MCV 107.2* 104.7*  PLT 349 751   Basic Metabolic Panel: Recent Labs  Lab 09/27/21 2222 09/28/21 0436  NA 132* 134*  K 4.1 4.6  CL 93* 96*  CO2 22 21*  GLUCOSE 122* 107*  BUN 52* 53*  CREATININE 11.64* 11.46*  CALCIUM 9.3 8.5*   GFR: CrCl cannot be calculated (Unknown ideal weight.). Liver Function Tests: Recent Labs  Lab 09/27/21 2222  AST 29  ALT 25  ALKPHOS 103  BILITOT 0.5  PROT 7.2  ALBUMIN 3.3*   Recent Labs  Lab 09/27/21 2222  LIPASE 83*   No results for input(s): AMMONIA in the last 168 hours. Coagulation Profile: No results for input(s): INR, PROTIME in the last 168 hours. Cardiac Enzymes: No results for  input(s): CKTOTAL, CKMB, CKMBINDEX, TROPONINI in the last 168 hours. BNP (last 3 results) No results for input(s): PROBNP in the last 8760 hours. HbA1C: No results for input(s): HGBA1C in the last 72 hours. CBG: No results for input(s): GLUCAP in the last 168 hours. Lipid Profile: No results for input(s): CHOL, HDL, LDLCALC, TRIG, CHOLHDL, LDLDIRECT in the last 72 hours. Thyroid Function Tests: No results for input(s): TSH, T4TOTAL, FREET4, T3FREE, THYROIDAB in the last 72 hours. Anemia Panel: No results for input(s): VITAMINB12, FOLATE, FERRITIN, TIBC, IRON, RETICCTPCT in the last 72 hours. Sepsis Labs: No results for input(s): PROCALCITON, LATICACIDVEN in the last 168 hours.  Recent Results (from the past 240 hour(s))  Resp Panel by  RT-PCR (Flu A&B, Covid) Nasopharyngeal Swab     Status: None   Collection Time: 09/27/21 11:10 PM   Specimen: Nasopharyngeal Swab; Nasopharyngeal(NP) swabs in vial transport medium  Result Value Ref Range Status   SARS Coronavirus 2 by RT PCR NEGATIVE NEGATIVE Final    Comment: (NOTE) SARS-CoV-2 target nucleic acids are NOT DETECTED.  The SARS-CoV-2 RNA is generally detectable in upper respiratory specimens during the acute phase of infection. The lowest concentration of SARS-CoV-2 viral copies this assay can detect is 138 copies/mL. A negative result does not preclude SARS-Cov-2 infection and should not be used as the sole basis for treatment or other patient management decisions. A negative result may occur with  improper specimen collection/handling, submission of specimen other than nasopharyngeal swab, presence of viral mutation(s) within the areas targeted by this assay, and inadequate number of viral copies(<138 copies/mL). A negative result must be combined with clinical observations, patient history, and epidemiological information. The expected result is Negative.  Fact Sheet for Patients:  EntrepreneurPulse.com.au  Fact Sheet for Healthcare Providers:  IncredibleEmployment.be  This test is no t yet approved or cleared by the [place] FDA and  has been authorized for detection and/or diagnosis of SARS-CoV-2 by FDA under an Emergency Use Authorization (EUA). This EUA will remain  in effect (meaning this test can be used) for the duration of the COVID-19 declaration under Section 564(b)(1) of the Act, 21 U.S.C.section 360bbb-3(b)(1), unless the authorization is terminated  or revoked sooner.       Influenza A by PCR NEGATIVE NEGATIVE Final   Influenza B by PCR NEGATIVE NEGATIVE Final    Comment: (NOTE) The Xpert Xpress SARS-CoV-2/FLU/RSV plus assay is intended as an aid in the diagnosis of influenza from Nasopharyngeal swab  specimens and should not be used as a sole basis for treatment. Nasal washings and aspirates are unacceptable for Xpert Xpress SARS-CoV-2/FLU/RSV testing.  Fact Sheet for Patients: EntrepreneurPulse.com.au  Fact Sheet for Healthcare Providers: IncredibleEmployment.be  This test is not yet approved or cleared by the [place] FDA and has been authorized for detection and/or diagnosis of SARS-CoV-2 by FDA under an Emergency Use Authorization (EUA). This EUA will remain in effect (meaning this test can be used) for the duration of the COVID-19 declaration under Section 564(b)(1) of the Act, 21 U.S.C. section 360bbb-3(b)(1), unless the authorization is terminated or revoked.  Performed at [facility], [place] [address], [place], [place] [address]   Culture, blood (routine x 2)     Status: None (Preliminary result)   Collection Time: 09/28/21 12:52 AM   Specimen: BLOOD RIGHT ARM  Result Value Ref Range Status   Specimen Description BLOOD RIGHT ARM  Final   Special Requests   Final    BOTTLES DRAWN AEROBIC AND  ANAEROBIC Blood Culture results may not be optimal due to an excessive volume of blood received in culture bottles   Culture   Final    NO GROWTH < 12 HOURS Performed at [facility] [address] [place], [place] [address]    Report Status PENDING  Incomplete  Culture, blood (routine x 2)     Status: None (Preliminary result)   Collection Time: 09/28/21 12:57 AM   Specimen: BLOOD LEFT ARM  Result Value Ref Range Status   Specimen Description BLOOD LEFT ARM  Final   Special Requests   Final    BOTTLES DRAWN AEROBIC AND ANAEROBIC Blood Culture results may not be optimal due to an excessive volume of blood received in culture bottles   Culture   Final    NO GROWTH < 12 HOURS Performed at [facility], [place] [address], [place], [place] [address]    Report Status PENDING  Incomplete         Radiology Studies: CT  ABDOMEN PELVIS WO CONTRAST  Result Date: 09/27/2021 CLINICAL DATA:  Lower abdominal pain for several hours, initial encounter EXAM: CT ABDOMEN AND PELVIS WITHOUT CONTRAST TECHNIQUE: Multidetector CT imaging of the abdomen and pelvis was performed following the standard protocol without IV contrast. RADIATION DOSE REDUCTION: This exam was performed according to the departmental dose-optimization program which includes automated exposure control, adjustment of the mA and/or kV according to patient size and/or use of iterative reconstruction technique. COMPARISON:  08/26/2021, MRI from 12/09/2020 FINDINGS: Lower chest: Lung bases are clear. Previously seen nodule is not included on this exam. Hepatobiliary: Liver shows no focal mass lesion. Gallbladder is within normal limits. Mild ascites is noted adjacent to the liver. Pancreas: Pancreas is well visualized. Stable cyst is noted in the midportion of the body similar to that seen on prior MRI examination. Spleen: Normal in size without focal abnormality. Adrenals/Urinary Tract: Adrenal glands are within normal limits. Kidneys are well visualized and atrophic stable in appearance from the prior exam. No obstructive changes are seen. The bladder is decompressed. Peritoneal dialysis catheter is noted low in the left pelvis. Free fluid is noted consistent with retained dialysate. Stomach/Bowel: Stomach is well visualized and within normal limits. No obstructive or inflammatory changes of large or small bowel are seen. The appendix has been surgically removed. Vascular/Lymphatic: Aortic atherosclerosis. No enlarged abdominal or pelvic lymph nodes. Reproductive: Uterus and bilateral adnexa are unremarkable. Other: Peritoneal dialysis catheter and retained dialysate are noted as described above. Musculoskeletal: Degenerative changes of the lumbar spine are noted. IMPRESSION: Peritoneal dialysis catheter with retained dialysate. Stable cyst in the midportion of the pancreatic  body unchanged from prior MRI. Follow-up as described on prior MRI. No acute abnormality is noted. Electronically Signed   By: Inez Catalina M.D.   On: 09/27/2021 23:09        Scheduled Meds:  enoxaparin (LOVENOX) injection  30 mg Subcutaneous Q24H   gentamicin cream  1 application. Topical Daily   sevelamer carbonate  2.4 g Oral TID WC   vancomycin variable dose per unstable renal function (pharmacist dosing)   Does not apply See admin instructions   Continuous Infusions:  dialysis solution 1.5% low-MG/low-CA     dialysis solution 2.5% low-MG/low-CA     meropenem (MERREM) IV       LOS: 1 day    Time spent: 75 minutes spent on chart review, discussion with nursing staff, consultants, updating family and interview/physical exam; more than 50% of that  time was spent in counseling and/or coordination of care.    Geradine Girt, DO [facility] Available via Epic secure chat 7am-7pm After these hours, please refer to coverage provider listed on [email or web address] 09/28/2021, 12:03 PM

## 2021-09-28 NOTE — ED Notes (Signed)
MD notified of patient having abdominal pain. Previous ordered medication made pt very nauseous even with zofran. ?

## 2021-09-28 NOTE — Progress Notes (Signed)
NEW ADMISSION NOTE ?New Admission Note:  ? ?Arrival Method: Stretcher ?Mental Orientation: A&O x 4 ?Telemetry: box 10 ?Assessment: Completed ?Skin: intact PD Cath access right lower abd ?IV: Right 4 arm salin locked ?Pain: with movement ?Tubes: PD access lower right abdominal ?Safety Measures: Safety Fall Prevention Plan has been given, discussed and signed ?Admission: Completed ?[facility] Orientation: Patient has been orientated to the room, unit and staff.  ?Family: contacted ? ?Orders have been reviewed and implemented. Will continue to monitor the patient. Call light has been placed within reach and bed alarm has been activated.  ? ?Berneta Levins, RN   ?

## 2021-09-28 NOTE — Progress Notes (Signed)
Pharmacy Antibiotic Note ? ?Felicia Williams is a 81 y.o. female with ESRD on peritoneal dialysis admitted on 09/27/2021 with sepsis, possible SBP.  Pharmacy has been consulted for Vancomycin and meropenem dosing.  Vancomycin 1 g IV given in ED at 0120  ? ?Plan: ?Meropenem 500 mg IV q24h ?F/U Vancomycin level in 3-4 days and redose as indicated ? ?  ? ?Temp (24hrs), Avg:98.5 ?F (36.9 ?C), Min:98.5 ?F (36.9 ?C), Max:98.5 ?F (36.9 ?C) ? ?Recent Labs  ?Lab 09/27/21 ?2222  ?WBC 14.0*  ?CREATININE 11.64*  ?  ?CrCl cannot be calculated (Unknown ideal weight.).   ? ?Allergies  ?Allergen Reactions  ? Cefepime Other (See Comments)  ?  Feb 2023 Encephalopathy with questionable seizures. Seems to be tolerating cefazolin   ? Morphine   ?  "Dry heaving like crazy"  ? Sulfa Antibiotics Other (See Comments)  ?  Shut pt's kidneys down  ? ? ? ? ?Dejana Pugsley, [place] ?09/28/2021 3:49 AM ? ?

## 2021-09-28 NOTE — Progress Notes (Addendum)
Pt  fluid cell count with differential specimen from PD catheter sent to Lab. Her fluid look in cloudy. Dr. Jonnie Finner notified. ?

## 2021-09-28 NOTE — ED Notes (Signed)
Pt resting in bed comfortably, no complaints at this time. RN reviewed the plan of care with pt and visitor at bedside. RN provided pt and visitor with warm blankets and pillows. ?

## 2021-09-29 DIAGNOSIS — R1084 Generalized abdominal pain: Secondary | ICD-10-CM

## 2021-09-29 DIAGNOSIS — N186 End stage renal disease: Secondary | ICD-10-CM

## 2021-09-29 DIAGNOSIS — T82898A Other specified complication of vascular prosthetic devices, implants and grafts, initial encounter: Secondary | ICD-10-CM

## 2021-09-29 DIAGNOSIS — Z992 Dependence on renal dialysis: Secondary | ICD-10-CM

## 2021-09-29 LAB — BASIC METABOLIC PANEL
Anion gap: 14 (ref 5–15)
BUN: 52 mg/dL — ABNORMAL HIGH (ref 8–23)
CO2: 23 mmol/L (ref 22–32)
Calcium: 7.9 mg/dL — ABNORMAL LOW (ref 8.9–10.3)
Chloride: 94 mmol/L — ABNORMAL LOW (ref 98–111)
Creatinine, Ser: 11.2 mg/dL — ABNORMAL HIGH (ref 0.44–1.00)
GFR, Estimated: 3 mL/min — ABNORMAL LOW (ref >60)
Glucose, Bld: 120 mg/dL — ABNORMAL HIGH (ref 70–99)
Potassium: 3.3 mmol/L — ABNORMAL LOW (ref 3.5–5.1)
Sodium: 131 mmol/L — ABNORMAL LOW (ref 135–145)

## 2021-09-29 LAB — CBC
HCT: 26.5 % — ABNORMAL LOW (ref 36.0–46.0)
Hemoglobin: 9.3 g/dL — ABNORMAL LOW (ref 12.0–15.0)
MCH: 35.6 pg — ABNORMAL HIGH (ref 26.0–34.0)
MCHC: 35.1 g/dL (ref 30.0–36.0)
MCV: 101.5 fL — ABNORMAL HIGH (ref 80.0–100.0)
Platelets: 232 10*3/uL (ref 150–400)
RBC: 2.61 MIL/uL — ABNORMAL LOW (ref 3.87–5.11)
RDW: 13.3 % (ref 11.5–15.5)
WBC: 7.6 10*3/uL (ref 4.0–10.5)
nRBC: 0 % (ref 0.0–0.2)

## 2021-09-29 MED ORDER — DELFLEX-LC/1.5% DEXTROSE 344 MOSM/L IP SOLN
INTRAPERITONEAL | Status: DC
  Administered 2021-09-29: 5000 mL via INTRAPERITONEAL

## 2021-09-29 MED ORDER — HYDROCODONE-ACETAMINOPHEN 10-325 MG PO TABS
0.5000 | ORAL_TABLET | Freq: Two times a day (BID) | ORAL | Status: DC | PRN
  Administered 2021-10-02: 0.5 via ORAL
  Filled 2021-09-29 (×2): qty 1

## 2021-09-29 NOTE — Progress Notes (Signed)
PROGRESS NOTE    Felicia Williams  VOJ:[id number] DOB: Aug 15, 1940 DOA: 09/27/2021 PCP: [name], NP    Brief Narrative:  Felicia Williams is a 81 y.o. female with medical history significant of ESRD on PD, HTN, GERD who presents with abdominal pain.  She recently had prolonged hospitalization from 2/4 to 2/16 for sepsis secondary to peritonitis.  She had gram-positive cocci on Gram stain but fluid and blood culture were negative.  She was initially treated with cefepime but developed acute metabolic encephalopathy, myoclonic jerks with toxic metabolic encephalopathy seen on LTM EEG.  She was transitioned to 2 weeks of IV meropenem instead per ID.  She completed her course of antibiotics on 2/17. When she left rehab she was starting to have pain in her stomach again.    Assessment and Plan: Sepsis ([place]) secondary to bacterial peritonitis -Presented with tachycardia and leukocytosis.  Negative CT abdomen and pelvis finding w/hx of ESRD on PD.  -pt had toxic metabolic encephalopathy thought to be due to cefepime and previous hospitalization -Continue IV vancomycin and meropenem.   -obtain blood and peritoneal fluid culture- done 3/9 after IV abx -pain control     ESRD on dialysis ([place]) -no urgent need for dialysis overnight -neprology consulted            DVT prophylaxis: enoxaparin (LOVENOX) injection 30 mg Start: 09/28/21 1000 SCDs Start: 09/28/21 0036    Code Status: Full Code Family Communication:   Disposition Plan:  Level of care: Telemetry Medical Status is: Inpatient Remains inpatient appropriate because: needs continues    Consultants:  renal   Subjective: No SOB, no CP- still with abdominal pain-- worsened with movement   Objective: Vitals:   09/29/21 0526 09/29/21 0914 09/29/21 0942 09/29/21 1006  BP: (!) 114/55 114/60 (!) 129/59   Pulse: 84 82 81   Resp: 17 18 17    Temp: 98.3 F (36.8 C) 98 F (36.7 C) 98.2 F (36.8 C)   TempSrc:  Oral Oral    SpO2: 95% 96% 96%   Weight:    53.8 kg  Height:        Intake/Output Summary (Last 24 hours) at 09/29/2021 1202 Last data filed at 09/29/2021 1000 Gross per 24 hour  Intake 600 ml  Output 0 ml  Net 600 ml   Filed Weights   09/28/21 1957 09/29/21 0500 09/29/21 1006  Weight: 54.7 kg 57.6 kg 53.8 kg    Examination:   General: Appearance:    Well developed, well nourished female in no acute distress   Abdomen tender to palpation  Lungs:     respirations unlabored  Heart:    Normal heart rate. Normal rhythm. No murmurs, rubs, or gallops.    MS:   All extremities are intact.    Neurologic:   Awake, alert, oriented x 3. No apparent focal neurological           defect.        Data Reviewed: I have personally reviewed following labs and imaging studies  CBC: Recent Labs  Lab 09/27/21 2222 09/28/21 0436 09/29/21 0302  WBC 14.0* 12.2* 7.6  HGB 12.0 10.4* 9.3*  HCT 35.6* 31.0* 26.5*  MCV 107.2* 104.7* 101.5*  PLT 349 272 993   Basic Metabolic Panel: Recent Labs  Lab 09/27/21 2222 09/28/21 0436 09/29/21 0302  NA 132* 134* 131*  K 4.1 4.6 3.3*  CL 93* 96* 94*  CO2 22 21* 23  GLUCOSE 122* 107* 120*  BUN 52* 53* 52*  CREATININE 11.64* 11.46* 11.20*  CALCIUM 9.3 8.5* 7.9*  PHOS  --  6.0*  --    GFR: Estimated Creatinine Clearance: 2.9 mL/min (A) (by C-G formula based on SCr of 11.2 mg/dL (H)). Liver Function Tests: Recent Labs  Lab 09/27/21 2222  AST 29  ALT 25  ALKPHOS 103  BILITOT 0.5  PROT 7.2  ALBUMIN 3.3*   Recent Labs  Lab 09/27/21 2222  LIPASE 83*   No results for input(s): AMMONIA in the last 168 hours. Coagulation Profile: No results for input(s): INR, PROTIME in the last 168 hours. Cardiac Enzymes: No results for input(s): CKTOTAL, CKMB, CKMBINDEX, TROPONINI in the last 168 hours. BNP (last 3 results) No results for input(s): PROBNP in the last 8760 hours. HbA1C: No results for input(s): HGBA1C in the last 72  hours. CBG: No results for input(s): GLUCAP in the last 168 hours. Lipid Profile: No results for input(s): CHOL, HDL, LDLCALC, TRIG, CHOLHDL, LDLDIRECT in the last 72 hours. Thyroid Function Tests: No results for input(s): TSH, T4TOTAL, FREET4, T3FREE, THYROIDAB in the last 72 hours. Anemia Panel: No results for input(s): VITAMINB12, FOLATE, FERRITIN, TIBC, IRON, RETICCTPCT in the last 72 hours. Sepsis Labs: No results for input(s): PROCALCITON, LATICACIDVEN in the last 168 hours.  Recent Results (from the past 240 hour(s))  Resp Panel by RT-PCR (Flu A&B, Covid) Nasopharyngeal Swab     Status: None   Collection Time: 09/27/21 11:10 PM   Specimen: Nasopharyngeal Swab; Nasopharyngeal(NP) swabs in vial transport medium  Result Value Ref Range Status   SARS Coronavirus 2 by RT PCR NEGATIVE NEGATIVE Final    Comment: (NOTE) SARS-CoV-2 target nucleic acids are NOT DETECTED.  The SARS-CoV-2 RNA is generally detectable in upper respiratory specimens during the acute phase of infection. The lowest concentration of SARS-CoV-2 viral copies this assay can detect is 138 copies/mL. A negative result does not preclude SARS-Cov-2 infection and should not be used as the sole basis for treatment or other patient management decisions. A negative result may occur with  improper specimen collection/handling, submission of specimen other than nasopharyngeal swab, presence of viral mutation(s) within the areas targeted by this assay, and inadequate number of viral copies(<138 copies/mL). A negative result must be combined with clinical observations, patient history, and epidemiological information. The expected result is Negative.  Fact Sheet for Patients:  EntrepreneurPulse.com.au  Fact Sheet for Healthcare Providers:  IncredibleEmployment.be  This test is no t yet approved or cleared by the [place] FDA and  has been authorized for detection and/or  diagnosis of SARS-CoV-2 by FDA under an Emergency Use Authorization (EUA). This EUA will remain  in effect (meaning this test can be used) for the duration of the COVID-19 declaration under Section 564(b)(1) of the Act, 21 U.S.C.section 360bbb-3(b)(1), unless the authorization is terminated  or revoked sooner.       Influenza A by PCR NEGATIVE NEGATIVE Final   Influenza B by PCR NEGATIVE NEGATIVE Final    Comment: (NOTE) The Xpert Xpress SARS-CoV-2/FLU/RSV plus assay is intended as an aid in the diagnosis of influenza from Nasopharyngeal swab specimens and should not be used as a sole basis for treatment. Nasal washings and aspirates are unacceptable for Xpert Xpress SARS-CoV-2/FLU/RSV testing.  Fact Sheet for Patients: EntrepreneurPulse.com.au  Fact Sheet for Healthcare Providers: IncredibleEmployment.be  This test is not yet approved or cleared by the [place] FDA and has been authorized for detection and/or diagnosis of SARS-CoV-2 by FDA under an Emergency Use Authorization (EUA). This EUA  will remain in effect (meaning this test can be used) for the duration of the COVID-19 declaration under Section 564(b)(1) of the Act, 21 U.S.C. section 360bbb-3(b)(1), unless the authorization is terminated or revoked.  Performed at [facility], [place] [address] [place], [place] [address]   Culture, blood (routine x 2)     Status: None (Preliminary result)   Collection Time: 09/28/21 12:52 AM   Specimen: BLOOD RIGHT ARM  Result Value Ref Range Status   Specimen Description BLOOD RIGHT ARM  Final   Special Requests   Final    BOTTLES DRAWN AEROBIC AND ANAEROBIC Blood Culture results may not be optimal due to an excessive volume of blood received in culture bottles   Culture   Final    NO GROWTH < 12 HOURS Performed at [facility], [place] [address], [place], [place] [address]    Report Status PENDING  Incomplete  Culture, blood  (routine x 2)     Status: None (Preliminary result)   Collection Time: 09/28/21 12:57 AM   Specimen: BLOOD LEFT ARM  Result Value Ref Range Status   Specimen Description BLOOD LEFT ARM  Final   Special Requests   Final    BOTTLES DRAWN AEROBIC AND ANAEROBIC Blood Culture results may not be optimal due to an excessive volume of blood received in culture bottles   Culture   Final    NO GROWTH < 12 HOURS Performed at [facility], [place] [address] [place], [place] [address]    Report Status PENDING  Incomplete  Body fluid culture w Gram Stain     Status: None (Preliminary result)   Collection Time: 09/28/21 10:50 AM   Specimen: Peritoneal Dialysate; Body Fluid  Result Value Ref Range Status   Specimen Description PERITONEAL DIALYSATE  Final   Special Requests NONE  Final   Gram Stain   Final    ABUNDANT WBC PRESENT,BOTH PMN AND MONONUCLEAR RARE GRAM POSITIVE COCCI OBSERVED INTRACELLULARLY AND EXTRACELLULARLY    Culture   Final    NO GROWTH < 24 HOURS Performed at [facility], [place] [address] [place],  [address]    Report Status PENDING  Incomplete         Radiology Studies: CT ABDOMEN PELVIS WO CONTRAST  Result Date: 09/27/2021 CLINICAL DATA:  Lower abdominal pain for several hours, initial encounter EXAM: CT ABDOMEN AND PELVIS WITHOUT CONTRAST TECHNIQUE: Multidetector CT imaging of the abdomen and pelvis was performed following the standard protocol without IV contrast. RADIATION DOSE REDUCTION: This exam was performed according to the departmental dose-optimization program which includes automated exposure control, adjustment of the mA and/or kV according to patient size and/or use of iterative reconstruction technique. COMPARISON:  08/26/2021, MRI from 12/09/2020 FINDINGS: Lower chest: Lung bases are clear. Previously seen nodule is not included on this exam. Hepatobiliary: Liver shows no focal mass lesion. Gallbladder is within normal limits. Mild ascites is  noted adjacent to the liver. Pancreas: Pancreas is well visualized. Stable cyst is noted in the midportion of the body similar to that seen on prior MRI examination. Spleen: Normal in size without focal abnormality. Adrenals/Urinary Tract: Adrenal glands are within normal limits. Kidneys are well visualized and atrophic stable in appearance from the prior exam. No obstructive changes are seen. The bladder is decompressed. Peritoneal dialysis catheter is noted low in the left pelvis. Free fluid is noted consistent with retained dialysate. Stomach/Bowel: Stomach is well visualized and within normal limits. No  obstructive or inflammatory changes of large or small bowel are seen. The appendix has been surgically removed. Vascular/Lymphatic: Aortic atherosclerosis. No enlarged abdominal or pelvic lymph nodes. Reproductive: Uterus and bilateral adnexa are unremarkable. Other: Peritoneal dialysis catheter and retained dialysate are noted as described above. Musculoskeletal: Degenerative changes of the lumbar spine are noted. IMPRESSION: Peritoneal dialysis catheter with retained dialysate. Stable cyst in the midportion of the pancreatic body unchanged from prior MRI. Follow-up as described on prior MRI. No acute abnormality is noted. Electronically Signed   By: Inez Catalina M.D.   On: 09/27/2021 23:09        Scheduled Meds:  enoxaparin (LOVENOX) injection  30 mg Subcutaneous Q24H   gentamicin cream  1 application. Topical Daily   sevelamer carbonate  2.4 g Oral TID WC   vancomycin variable dose per unstable renal function (pharmacist dosing)   Does not apply See admin instructions   Continuous Infusions:  dialysis solution 1.5% low-MG/low-CA     dialysis solution 2.5% low-MG/low-CA     meropenem (MERREM) IV 500 mg (09/29/21 0024)     LOS: 2 days    Time spent: 75 minutes spent on chart review, discussion with nursing staff, consultants, updating family and interview/physical exam; more than 50% of that  time was spent in counseling and/or coordination of care.    Geradine Girt, DO [facility] Available via Epic secure chat 7am-7pm After these hours, please refer to coverage provider listed on [email or web address] 09/29/2021, 12:02 PM

## 2021-09-29 NOTE — Care Management Important Message (Signed)
Important Message ? ?Patient Details  ?Name: Felicia Williams ?MRN: [id number] ?Date of Birth: [birth year] ? ? ?Medicare Important Message Given:  Yes ? ? ? ? ?Felicia Williams ?09/29/2021, 1:39 PM ?

## 2021-09-29 NOTE — Progress Notes (Addendum)
[facility] ?Progress Note ? ?Subjective: abd pain worse today, hurts when moving or when eating ? ?Vitals:  ? 09/29/21 0526 09/29/21 0914 09/29/21 0942 09/29/21 1006  ?BP: (!) 114/55 114/60 (!) 129/59   ?Pulse: 84 82 81   ?Resp: 17 18 17    ?Temp: 98.3 ?F (36.8 ?C) 98 ?F (36.7 ?C) 98.2 ?F (36.8 ?C)   ?TempSrc: Oral Oral    ?SpO2: 95% 96% 96%   ?Weight:    53.8 kg  ?Height:      ? ? ?Exam: ? alert, nad  ? no jvd ? Chest cta bilat ? Cor reg no RG ? Abd diffuse tenderness to any palpation, +rebound , + guarding, PD cath w/ clean exit site ?  Ext no LE edema ?  Alert, NF, ox3 ?   ? ? ?Home meds include - norco prn, renvela 3 ac tid, rena vit, trazodone, pepcid ?  ?  ? OP PD: 5 exchanges overnight, 2.5 L vol, usually hangs 2- 6L 2.5% bags and 1 - 3 L 1.5% bag. Does last fill w/ icodextrin for daybag.  Dry wt 57kg. ?  ?   Na 134  K 4.6  CO2 21  BUN 53  Cr 11  Ca 8.5  WBC 12k Hb 10.4 ?  ?  ?Assessment/ Plan: ?Abdominal pain - w/ hx or recent culture neg peritonitis in Feb 2023 and treated w/ 14 days of IV abx, last day was 2/17. Pt returns now w/ recurrent abd pain yesterday to ED. PD fluid from yesterday showed 10,290 cell ct w/ 93% segs which is consistent w/ recurrent peritonitis. Today her pain/ exam is significantly worse w/ rebound tenderness and pain w/ any movement or eating. Will consult VVS to evaluate for removal of PD catheter.   ?ESRD - on [facility]CPD w/ [facility] in [place], [place]. F/b [facility] group. CCPD nightly if tolerating. Does not have any HD access.  ?HTN/ vol - BP's wnl, euvolemic on exam. Under dry wt. Use all 1.5% bags tonight.   ?Anemia ckd - Hb okay, no esa needs ?MBD ckd - Ca in range. Cont binder, add on phos.  ?  ?  ? ? ? ? ?Rob Karole Oo ?09/29/2021, 2:46 PM ? ? ?Recent Labs  ?Lab 09/27/21 ?2222 09/28/21 ?0436 09/29/21 ?0302  ?HGB 12.0 10.4* 9.3*  ?ALBUMIN 3.3*  --   --   ?CALCIUM 9.3 8.5* 7.9*  ?PHOS  --  6.0*  --   ?CREATININE 11.64* 11.46* 11.20*  ?K 4.1 4.6 3.3*  ? ?Inpatient medications: ?  enoxaparin (LOVENOX) injection  30 mg Subcutaneous Q24H  ? gentamicin cream  1 application. Topical Daily  ? sevelamer carbonate  2.4 g Oral TID WC  ? vancomycin variable dose per unstable renal function (pharmacist dosing)   Does not apply See admin instructions  ? ? dialysis solution 1.5% low-MG/low-CA    ? dialysis solution 2.5% low-MG/low-CA    ? meropenem (MERREM) IV 500 mg (09/29/21 0024)  ? ?acetaminophen, fentaNYL (SUBLIMAZE) injection **OR** fentaNYL (SUBLIMAZE) injection, HYDROcodone-acetaminophen, hydrOXYzine, prochlorperazine, traZODone ? ? ? ? ? ? ?

## 2021-09-29 NOTE — Consult Note (Signed)
Hospital Consult    Reason for Consult: PD catheter removal for recurrent peritonitis Referring Physician: Nephrology MRN #:  [id number]  History of Present Illness: This is a 81 y.o. female with history of end-stage renal disease on PD dialysis, hypertension, GERD that vascular surgery has been consulted for removal of PD catheter.  She was recently admitted last month for sepsis secondary to peritonitis.  Ultimately she was readmitted with abdominal pain for one week.  She had CT scan of her abdomen pelvis.  Cultures again are concerning for peritonitis.  Body fluid yesterday from peritoneal fluid showed 10,290 nucleated cells with 93% neutrophils.  She states her PD catheter was placed in [facility] in November 2020.  This is her second PD catheter.  She has had tunneled catheter in the past for hemodialysis but has no other access other than her PD catheter.  She states her entire belly is tender.  History of appendectomy and tubal ligation.  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 Laural Golden.   GERD (gastroesophageal reflux disease)    Glomerulonephritis    Gout    Mixed hyperlipidemia    Renal insufficiency    Spondylosis     Past Surgical History:  Procedure Laterality Date   APPENDECTOMY     CATARACT EXTRACTION     COLONOSCOPY N/A 12/28/2015   Procedure: COLONOSCOPY;  Surgeon: Rogene Houston, MD;  [facility];  Service: Endoscopy;  Laterality: N/A;  815   ESOPHAGOGASTRODUODENOSCOPY N/A 11/16/2020   Procedure: ESOPHAGOGASTRODUODENOSCOPY (EGD);  Surgeon: Rogene Houston, MD;  Location: [facility];  Service: Endoscopy;  Laterality: N/A;  1:15   IR FLUORO GUIDE CV LINE RIGHT  01/16/2019   IR REMOVAL TUN CV CATH W/O FL  07/01/2019   IR US GUIDE VASC ACCESS RIGHT   01/16/2019   POLYPECTOMY  11/16/2020   Procedure: POLYPECTOMY;  Surgeon: Rogene Houston, MD;  Location: [facility];  Service: Endoscopy;;  gastric   TUBAL LIGATION      Allergies  Allergen Reactions   Cefepime Other (See Comments)    Feb 2023 Encephalopathy with questionable seizures. Seems to be tolerating cefazolin    Morphine     "Dry heaving like crazy"   Sulfa Antibiotics Other (See Comments)    Shut pt's kidneys down    Prior to Admission medications   Medication Sig Start Date End Date Taking? Authorizing Provider  acetaminophen (TYLENOL) 325 MG tablet Take 2 tablets (650 mg total) by mouth every 6 (six) hours as needed for mild pain. 09/12/21  Yes Love, Ivan Anchors, PA-C  camphor-menthol [facility]) lotion Apply topically as needed for itching. 09/12/21  Yes Love, Ivan Anchors, PA-C  HYDROcodone-acetaminophen (NORCO) 10-325 MG tablet Take 0.5 tablets by mouth 2 (two) times daily as needed for moderate pain. 09/20/21  Yes [provider]  hydrOXYzine (ATARAX) 10 MG tablet Take 1 tablet (10 mg total) by mouth 3 (three) times daily as needed for itching. 09/12/21  Yes Love, Ivan Anchors, PA-C  loperamide (IMODIUM) 2 MG capsule Take 1 capsule (2 mg total) by mouth as needed for diarrhea or loose stools. 09/12/21  Yes Love, Ivan Anchors, PA-C  multivitamin (RENA-VIT) TABS tablet Take 1 tablet by mouth at bedtime.  01/20/19  Yes Nita Sells, MD  sevelamer carbonate (RENVELA) 2.4 g PACK Take 2.4 g by mouth 3 (three) times daily with meals. 09/12/21  Yes Love, Ivan Anchors, PA-C  traZODone (DESYREL) 50 MG tablet Take 0.5-1 tablets (25-50 mg total) by mouth at bedtime as needed for sleep. 09/12/21  Yes Love, Ivan Anchors, PA-C  famotidine (PEPCID) 20 MG tablet Take 1 tablet (20 mg total) by mouth at bedtime. Patient not taking: Reported on 08/27/2021 11/14/20   Rogene Houston, MD  gentamicin cream (GARAMYCIN) 0.1 % Apply 1 application topically daily. Patient not taking: Reported on 09/28/2021 09/08/21    Mercy Riding, MD    Social History   Socioeconomic History   Marital status: Single    Spouse name: Not on file   Number of children: Not on file   Years of education: Not on file   Highest education level: Not on file  Occupational History   Not on file  Tobacco Use   Smoking status: Never    Passive exposure: Never   Smokeless tobacco: Never  Vaping Use   Vaping Use: Never used  Substance and Sexual Activity   Alcohol use: No   Drug use: No   Sexual activity: Not Currently  Other Topics Concern   Not on file  Social History Narrative   Not on file   Social Determinants of Health   Financial Resource Strain: Not on file  Food Insecurity: Not on file  Transportation Needs: Not on file  Physical Activity: Not on file  Stress: Not on file  Social Connections: Not on file  Intimate Partner Violence: Not on file     Family History  Problem Relation Age of Onset   CAD Father    Heart attack Father    Diabetes Mellitus II Father    Hypertension Father    Lupus Brother     ROS: [x]  Positive   [ ]  Negative   [ ]  All sytems reviewed and are negative  Cardiovascular: []  chest pain/pressure []  palpitations []  SOB lying flat []  DOE []  pain in legs while walking []  pain in legs at rest []  pain in legs at night []  non-healing ulcers []  hx of DVT []  swelling in legs  Pulmonary: []  productive cough []  asthma/wheezing []  home O2  Neurologic: []  weakness in []  arms []  legs []  numbness in []  arms []  legs []  hx of CVA []  mini stroke [] difficulty speaking or slurred speech []  temporary loss of vision in one eye []  dizziness  Hematologic: []  hx of cancer []  bleeding problems []  problems with blood clotting easily  Endocrine:   []  diabetes []  thyroid disease  GI []  vomiting blood []  blood in stool  GU: []  CKD/renal failure []  HD--[]  M/W/F or []  T/T/S []  burning with urination []  blood in urine  Psychiatric: []  anxiety []   depression  Musculoskeletal: []  arthritis []  joint pain  Integumentary: []  rashes []  ulcers  Constitutional: []  fever []  chills   Physical Examination  Vitals:   09/29/21 0914 09/29/21 0942  BP: 114/60 (!) 129/59  Pulse: 82 81  Resp: 18 17  Temp: 98 F (36.7 C) 98.2 F (36.8 C)  SpO2: 96% 96%   Body mass index is 23.96 kg/m.  General:  NAD Gait: Not observed HENT: WNL, normocephalic Pulmonary: normal non-labored breathing Cardiac: regular, without  Murmurs, rubs or gallops Abdomen: Soft, generalized peritonitis with guarding, right-sided peritoneal catheter Extremities: without ischemic changes, without Gangrene Musculoskeletal: no muscle wasting  or atrophy  Neurologic: A&O X 3; Appropriate Affect ; SENSATION: normal; MOTOR FUNCTION:  moving all extremities equally. Speech is fluent/normal  CBC    Component Value Date/Time   WBC 7.6 09/29/2021 0302   RBC 2.61 (L) 09/29/2021 0302   HGB 9.3 (L) 09/29/2021 0302   HCT 26.5 (L) 09/29/2021 0302   PLT 232 09/29/2021 0302   MCV 101.5 (H) 09/29/2021 0302   MCH 35.6 (H) 09/29/2021 0302   MCHC 35.1 09/29/2021 0302   RDW 13.3 09/29/2021 0302   LYMPHSABS 1.5 09/08/2021 0527   MONOABS 1.2 (H) 09/08/2021 0527   EOSABS 0.4 09/08/2021 0527   BASOSABS 0.1 09/08/2021 0527    BMET    Component Value Date/Time   NA 131 (L) 09/29/2021 0302   K 3.3 (L) 09/29/2021 0302   CL 94 (L) 09/29/2021 0302   CO2 23 09/29/2021 0302   GLUCOSE 120 (H) 09/29/2021 0302   BUN 52 (H) 09/29/2021 0302   CREATININE 11.20 (H) 09/29/2021 0302   CALCIUM 7.9 (L) 09/29/2021 0302   CALCIUM 7.1 (L) 01/10/2019 0632   GFRNONAA 3 (L) 09/29/2021 0302   GFRAA 11 (L) 01/20/2019 0608    COAGS: Lab Results  Component Value Date   INR 1.1 08/27/2021   INR 1.1 01/16/2019     Non-Invasive Vascular Imaging:    None  ASSESSMENT/PLAN: This is a 81 y.o. female  with history of end-stage renal disease on PD dialysis that vascular surgery has been  consulted for removal of PD catheter in the setting of recurrent peritonitis that has not responded to antibiotic treatment and multiple hospital admissions.  I have posted her tomorrow morning for PD catheter removal and placement of a tunneled dialysis catheter with my partner Dr. Donzetta Matters.  I discussed risks and benefits with the patient.  Please keep n.p.o. after midnight.  All questions answered  Marty Heck, MD [facility] Office: [place]

## 2021-09-29 NOTE — Progress Notes (Signed)
?  Transition of Care (TOC) Screening Note ? ? ?Patient Details  ?Name: Felicia Williams ?Date of Birth: 05/16/1941 ? ? ?Transition of Care (TOC) CM/SW Contact:    ?Tom-Johnson, Renea Ee, RN ?Phone Number: ?09/29/2021, 2:42 PM ? ? ? ?Patient is from home and grand daughter lives with her. Admitted for Peritonitis. Does daily Peritoneal Dialysis at home by [facility]. Has two supportive adult children. Independent with care and drive self prior to admission. Has a cane, walker, wheelchair, shower seat at home. PCP is [name], NP and uses [facility] in [place] and also [facility] for mail order.  ?Transition of Care Department [facility]) has reviewed patient and no TOC needs or recommendations have been identified at this time. TOC will continue to monitor patient advancement through interdisciplinary progression rounds. If new patient transition needs arise, please place a TOC consult. ?  ?

## 2021-09-30 ENCOUNTER — Inpatient Hospital Stay (HOSPITAL_COMMUNITY): Payer: Medicare Other

## 2021-09-30 ENCOUNTER — Encounter (HOSPITAL_COMMUNITY)
Admission: EM | Disposition: A | Discharge status: Home / Self Care | Payer: Self-pay | Source: Home / Self Care | Attending: Internal Medicine

## 2021-09-30 ENCOUNTER — Other Ambulatory Visit: Payer: Self-pay

## 2021-09-30 ENCOUNTER — Encounter (HOSPITAL_COMMUNITY): Payer: Self-pay | Admitting: Family Medicine

## 2021-09-30 ENCOUNTER — Inpatient Hospital Stay (HOSPITAL_COMMUNITY): Payer: Medicare Other | Admitting: Anesthesiology

## 2021-09-30 DIAGNOSIS — I120 Hypertensive chronic kidney disease with stage 5 chronic kidney disease or end stage renal disease: Secondary | ICD-10-CM

## 2021-09-30 DIAGNOSIS — Z992 Dependence on renal dialysis: Secondary | ICD-10-CM

## 2021-09-30 DIAGNOSIS — Z9889 Other specified postprocedural states: Secondary | ICD-10-CM

## 2021-09-30 DIAGNOSIS — T8571XA Infection and inflammatory reaction due to peritoneal dialysis catheter, initial encounter: Secondary | ICD-10-CM

## 2021-09-30 DIAGNOSIS — N186 End stage renal disease: Secondary | ICD-10-CM

## 2021-09-30 HISTORY — PX: REMOVAL OF A DIALYSIS CATHETER: SHX6053

## 2021-09-30 HISTORY — PX: INSERTION OF DIALYSIS CATHETER: SHX1324

## 2021-09-30 LAB — CBC
HCT: 25.9 % — ABNORMAL LOW (ref 36.0–46.0)
Hemoglobin: 9.2 g/dL — ABNORMAL LOW (ref 12.0–15.0)
MCH: 35.4 pg — ABNORMAL HIGH (ref 26.0–34.0)
MCHC: 35.5 g/dL (ref 30.0–36.0)
MCV: 99.6 fL (ref 80.0–100.0)
Platelets: 239 10*3/uL (ref 150–400)
RBC: 2.6 MIL/uL — ABNORMAL LOW (ref 3.87–5.11)
RDW: 13.2 % (ref 11.5–15.5)
WBC: 6.9 10*3/uL (ref 4.0–10.5)
nRBC: 0 % (ref 0.0–0.2)

## 2021-09-30 LAB — BASIC METABOLIC PANEL
Anion gap: 14 (ref 5–15)
BUN: 49 mg/dL — ABNORMAL HIGH (ref 8–23)
CO2: 26 mmol/L (ref 22–32)
Calcium: 7.9 mg/dL — ABNORMAL LOW (ref 8.9–10.3)
Chloride: 91 mmol/L — ABNORMAL LOW (ref 98–111)
Creatinine, Ser: 9.92 mg/dL — ABNORMAL HIGH (ref 0.44–1.00)
GFR, Estimated: 4 mL/min — ABNORMAL LOW (ref >60)
Glucose, Bld: 125 mg/dL — ABNORMAL HIGH (ref 70–99)
Potassium: 3.3 mmol/L — ABNORMAL LOW (ref 3.5–5.1)
Sodium: 131 mmol/L — ABNORMAL LOW (ref 135–145)

## 2021-09-30 LAB — PATHOLOGIST SMEAR REVIEW: Path Review: ELEVATED

## 2021-09-30 SURGERY — REMOVAL, DIALYSIS CATHETER
Anesthesia: General | Site: Neck | Laterality: Right

## 2021-09-30 MED ORDER — LIDOCAINE HCL (PF) 1 % IJ SOLN
INTRAMUSCULAR | Status: AC
  Filled 2021-09-30: qty 30

## 2021-09-30 MED ORDER — LIDOCAINE 2% (20 MG/ML) 5 ML SYRINGE
INTRAMUSCULAR | Status: DC | PRN
Start: 2021-09-30 — End: 2021-09-30
  Administered 2021-09-30: 60 mg via INTRAVENOUS

## 2021-09-30 MED ORDER — HEPARIN SODIUM (PORCINE) 1000 UNIT/ML IJ SOLN
INTRAMUSCULAR | Status: AC
  Filled 2021-09-30: qty 4

## 2021-09-30 MED ORDER — PROPOFOL 10 MG/ML IV BOLUS
INTRAVENOUS | Status: AC
  Filled 2021-09-30: qty 20

## 2021-09-30 MED ORDER — HEPARIN 6000 UNIT IRRIGATION SOLUTION
Status: AC
  Filled 2021-09-30: qty 500

## 2021-09-30 MED ORDER — FENTANYL CITRATE (PF) 100 MCG/2ML IJ SOLN: 25.0000 ug | INTRAMUSCULAR | Status: DC | PRN

## 2021-09-30 MED ORDER — CHLORHEXIDINE GLUCONATE 0.12 % MT SOLN
15.0000 mL | Freq: Once | OROMUCOSAL | Status: AC
  Administered 2021-09-30: 15 mL via OROMUCOSAL

## 2021-09-30 MED ORDER — ORAL CARE MOUTH RINSE: 15.0000 mL | Freq: Once | OROMUCOSAL | Status: AC

## 2021-09-30 MED ORDER — HEPARIN 6000 UNIT IRRIGATION SOLUTION
Status: DC | PRN
  Administered 2021-09-30: 1

## 2021-09-30 MED ORDER — HEPARIN SODIUM (PORCINE) 1000 UNIT/ML IJ SOLN
INTRAMUSCULAR | Status: AC
  Filled 2021-09-30: qty 10

## 2021-09-30 MED ORDER — ONDANSETRON HCL 4 MG/2ML IJ SOLN
INTRAMUSCULAR | Status: DC | PRN
  Administered 2021-09-30: 4 mg via INTRAVENOUS

## 2021-09-30 MED ORDER — HEPARIN SODIUM (PORCINE) 1000 UNIT/ML IJ SOLN
INTRAMUSCULAR | Status: DC | PRN
  Administered 2021-09-30: 3.2 [IU] via INTRAVENOUS

## 2021-09-30 MED ORDER — SUCCINYLCHOLINE CHLORIDE 200 MG/10ML IV SOSY
PREFILLED_SYRINGE | INTRAVENOUS | Status: DC | PRN
  Administered 2021-09-30: 80 mg via INTRAVENOUS

## 2021-09-30 MED ORDER — ACETAMINOPHEN 500 MG PO TABS
ORAL_TABLET | ORAL | Status: AC
Start: 2021-09-30 — End: 2021-09-30
  Filled 2021-09-30: qty 2

## 2021-09-30 MED ORDER — 0.9 % SODIUM CHLORIDE (POUR BTL) OPTIME
TOPICAL | Status: DC | PRN
  Administered 2021-09-30: 1000 mL

## 2021-09-30 MED ORDER — PROPOFOL 10 MG/ML IV BOLUS
INTRAVENOUS | Status: DC | PRN
  Administered 2021-09-30: 20 mg via INTRAVENOUS
  Administered 2021-09-30: 80 mg via INTRAVENOUS

## 2021-09-30 MED ORDER — CHLORHEXIDINE GLUCONATE 0.12 % MT SOLN
OROMUCOSAL | Status: AC
  Filled 2021-09-30: qty 15

## 2021-09-30 MED ORDER — FENTANYL CITRATE (PF) 250 MCG/5ML IJ SOLN
INTRAMUSCULAR | Status: DC | PRN
  Administered 2021-09-30 (×2): 50 ug via INTRAVENOUS

## 2021-09-30 MED ORDER — LIDOCAINE-EPINEPHRINE (PF) 1 %-1:200000 IJ SOLN
INTRAMUSCULAR | Status: AC
  Filled 2021-09-30: qty 30

## 2021-09-30 MED ORDER — LIDOCAINE-EPINEPHRINE (PF) 1 %-1:200000 IJ SOLN
INTRAMUSCULAR | Status: DC | PRN
Start: 2021-09-30 — End: 2021-09-30
  Administered 2021-09-30: 6 mL

## 2021-09-30 MED ORDER — CHLORHEXIDINE GLUCONATE CLOTH 2 % EX PADS
6.0000 | MEDICATED_PAD | Freq: Every day | CUTANEOUS | Status: DC
  Administered 2021-09-30: 6 via TOPICAL

## 2021-09-30 MED ORDER — SODIUM CHLORIDE 0.9 % IV SOLN: INTRAVENOUS | Status: DC

## 2021-09-30 MED ORDER — DEXAMETHASONE SODIUM PHOSPHATE 10 MG/ML IJ SOLN
INTRAMUSCULAR | Status: DC | PRN
  Administered 2021-09-30: 10 mg via INTRAVENOUS

## 2021-09-30 MED ORDER — FENTANYL CITRATE (PF) 250 MCG/5ML IJ SOLN
INTRAMUSCULAR | Status: AC
  Filled 2021-09-30: qty 5

## 2021-09-30 MED ORDER — PHENYLEPHRINE HCL (PRESSORS) 10 MG/ML IV SOLN
INTRAVENOUS | Status: DC | PRN
  Administered 2021-09-30 (×3): 80 ug via INTRAVENOUS

## 2021-09-30 MED ORDER — ACETAMINOPHEN 500 MG PO TABS
1000.0000 mg | ORAL_TABLET | Freq: Once | ORAL | Status: AC
  Administered 2021-09-30: 1000 mg via ORAL

## 2021-09-30 MED ORDER — CHLORHEXIDINE GLUCONATE CLOTH 2 % EX PADS
6.0000 | MEDICATED_PAD | Freq: Every day | CUTANEOUS | Status: DC
  Administered 2021-10-01 – 2021-10-03 (×3): 6 via TOPICAL

## 2021-09-30 SURGICAL SUPPLY — 59 items
ADH SKN CLS APL DERMABOND .7 (GAUZE/BANDAGES/DRESSINGS) ×2
ADH SKN CLS LQ APL DERMABOND (GAUZE/BANDAGES/DRESSINGS) ×2
BAG COUNTER SPONGE SURGICOUNT (BAG) ×3 IMPLANT
BAG DECANTER FOR FLEXI CONT (MISCELLANEOUS) ×4 IMPLANT
BAG SPNG CNTER NS LX DISP (BAG) ×2
BAG SURGICOUNT SPONGE COUNTING (BAG) ×1
BIOPATCH RED 1 DISK 7.0 (GAUZE/BANDAGES/DRESSINGS) ×3 IMPLANT
BIOPATCH RED 1IN DISK 7.0MM (GAUZE/BANDAGES/DRESSINGS) ×1
BLADE SURG 10 STRL SS (BLADE) ×2 IMPLANT
CATH PALINDROME-P 19CM W/VT (CATHETERS) ×2 IMPLANT
CATH PALINDROME-P 23CM W/VT (CATHETERS) IMPLANT
CATH PALINDROME-P 28CM W/VT (CATHETERS) IMPLANT
CLIP LIGATING EXTRA MED SLVR (CLIP) ×4 IMPLANT
CLIP LIGATING EXTRA SM BLUE (MISCELLANEOUS) ×4 IMPLANT
COVER PROBE W GEL 5X96 (DRAPES) ×4 IMPLANT
COVER SURGICAL LIGHT HANDLE (MISCELLANEOUS) ×4 IMPLANT
DERMABOND ADHESIVE PROPEN (GAUZE/BANDAGES/DRESSINGS) ×2
DERMABOND ADVANCED (GAUZE/BANDAGES/DRESSINGS) ×2
DERMABOND ADVANCED .7 DNX12 (GAUZE/BANDAGES/DRESSINGS) ×2 IMPLANT
DERMABOND ADVANCED .7 DNX6 (GAUZE/BANDAGES/DRESSINGS) IMPLANT
DRAPE C-ARM 42X72 X-RAY (DRAPES) ×4 IMPLANT
DRAPE CHEST BREAST 15X10 FENES (DRAPES) ×4 IMPLANT
DRAPE IMP U-DRAPE 54X76 (DRAPES) ×4 IMPLANT
ELECT CAUTERY BLADE 6.4 (BLADE) ×2 IMPLANT
GAUZE 4X4 16PLY ~~LOC~~+RFID DBL (SPONGE) ×4 IMPLANT
GAUZE SPONGE 2X2 8PLY STRL LF (GAUZE/BANDAGES/DRESSINGS) IMPLANT
GAUZE SPONGE 4X4 12PLY STRL (GAUZE/BANDAGES/DRESSINGS) ×2 IMPLANT
GLOVE SURG ENC MOIS LTX SZ7.5 (GLOVE) ×4 IMPLANT
GOWN STRL REUS W/ TWL LRG LVL3 (GOWN DISPOSABLE) ×2 IMPLANT
GOWN STRL REUS W/ TWL XL LVL3 (GOWN DISPOSABLE) ×2 IMPLANT
GOWN STRL REUS W/TWL LRG LVL3 (GOWN DISPOSABLE) ×4
GOWN STRL REUS W/TWL XL LVL3 (GOWN DISPOSABLE) ×4
KIT BASIN OR (CUSTOM PROCEDURE TRAY) ×4 IMPLANT
KIT PALINDROME-P 55CM (CATHETERS) IMPLANT
KIT TURNOVER KIT B (KITS) ×4 IMPLANT
NDL 18GX1X1/2 (RX/OR ONLY) (NEEDLE) ×2 IMPLANT
NDL HYPO 25GX1X1/2 BEV (NEEDLE) ×2 IMPLANT
NEEDLE 18GX1X1/2 (RX/OR ONLY) (NEEDLE) ×4 IMPLANT
NEEDLE HYPO 25GX1X1/2 BEV (NEEDLE) ×4 IMPLANT
NS IRRIG 1000ML POUR BTL (IV SOLUTION) ×4 IMPLANT
PACK SURGICAL SETUP 50X90 (CUSTOM PROCEDURE TRAY) ×4 IMPLANT
PAD ARMBOARD 7.5X6 YLW CONV (MISCELLANEOUS) ×8 IMPLANT
PENCIL BUTTON HOLSTER BLD 10FT (ELECTRODE) ×2 IMPLANT
SOAP 2 % CHG 4 OZ (WOUND CARE) ×4 IMPLANT
SPONGE GAUZE 2X2 STER 10/PKG (GAUZE/BANDAGES/DRESSINGS)
SPONGE T-LAP 18X18 ~~LOC~~+RFID (SPONGE) ×2 IMPLANT
SUT ETHILON 3 0 PS 1 (SUTURE) ×4 IMPLANT
SUT MNCRL AB 4-0 PS2 18 (SUTURE) ×4 IMPLANT
SUT VIC AB 3-0 SH 27 (SUTURE) ×8
SUT VIC AB 3-0 SH 27X BRD (SUTURE) IMPLANT
SYR 10ML LL (SYRINGE) ×6 IMPLANT
SYR 20ML LL LF (SYRINGE) ×8 IMPLANT
SYR 5ML LL (SYRINGE) ×4 IMPLANT
SYR CONTROL 10ML LL (SYRINGE) ×4 IMPLANT
TAPE CLOTH SURG 4X10 WHT LF (GAUZE/BANDAGES/DRESSINGS) ×2 IMPLANT
TAPE STRIPS DRAPE STRL (GAUZE/BANDAGES/DRESSINGS) ×2 IMPLANT
TOWEL GREEN STERILE (TOWEL DISPOSABLE) ×4 IMPLANT
TOWEL GREEN STERILE FF (TOWEL DISPOSABLE) ×8 IMPLANT
WATER STERILE IRR 1000ML POUR (IV SOLUTION) ×4 IMPLANT

## 2021-09-30 NOTE — Op Note (Signed)
? ? ?  Patient name: Felicia Williams MRN: [id number] DOB: [birth year] Sex: female ? ?09/27/2021  ?Pre-operative Diagnosis: End-stage renal disease, infected peritoneal dialysis catheter ?Post-operative diagnosis:  Same ?Surgeon:  Eda Paschal. Donzetta Matters, MD ?Procedure Performed: ?1.  Right IJ 19 cm tunneled dialysis catheter with ultrasound fluoroscopic guidance ?2.  Removal of peritoneal dialysis catheter ? ?Indications: 81 year old female with end-stage renal disease, she is currently dialyzing with PD catheter but this is now infected for the second time.  She is now indicated for tunneled catheter for hemodialysis and removal of her peritoneal dialysis catheter. ? ?Findings: The right IJ was patent and compressible.  Cath was placed to the SVC atrial junction. ? ?The PD catheter had 3 cuffs all of which were outside of the fascia these were identified with ultrasound and incisions were made based on the placement of ultrasound identified cuffs and the catheter was removed and noted to be fully intact. ?  ?Procedure:  The patient was identified in the holding area and taken to the operating room where she is placed supine operative table and general anesthesia was induced.  She was sterilely prepped and draped in the abdomen the neck and chest in usual fashion, antibiotics were up-to-date and timeout was called.  Ultrasound was initially used to evaluate the right IJ.  This was noted to be patent and compressible.  The vein was cannulated with an 18-gauge needle with direct ultrasound visualization and a wire was passed centrally.  A 19 cm catheter was tunneled from a counterincision and the incision at the neck was widened with 11 blade.  The wire tract was serially dilated introducer sheath was placed with fluoroscopic guidance.  The catheter was placed to the SVC atrial junction flushed with heparinized saline affixed to the skin with 3-0 nylon suture and locked with 1.5 cc of concentrated heparin in either port.  The neck  incision was closed with 4-0 Monocryl and Dermabond was placed at both sites and a sterile dressing was placed. ? ?Attention was then turned to the abdomen.  Ultrasound was used to identify the cuffs of the catheter.  A transverse incision was made just at the level of the umbilicus where a previous incision existed.  We then dissected down onto the cuff which was outside of the rectus fascia this was dissected free and the catheter was removed from the abdomen.  There was only a very small fascial defect this was not closed.  This was removed and noted to be fully intact.  Catheter was tracked back through the subcutaneous tissue where I marked on the skin with marker.  There were 2 additional coughs and a metal fixation.  A counterincision was made I was able to dissected out the catheter entirely and it was removed and noted to be fully intact.  The catheter tract was thoroughly irrigated.  The wounds were closed with 3-0 and 4-0 Vicryl.  Dermabond placed at the skin site.  She was awakened from anesthesia having tolerated procedure without any complication.  All counts were correct at completion. ? ?EBL: 20 cc ? ? ?[name] C. Donzetta Matters, MD ?[facility] ?Office: [phone] ?Pager: [phone] ? ? ?

## 2021-09-30 NOTE — Progress Notes (Signed)
Pt back from HD.  ? ?Tennis Must ? ?

## 2021-09-30 NOTE — Progress Notes (Signed)
?  Progress Note ? ? ? ?09/30/2021 ?7:25 AM ?Day of Surgery ? ?Subjective:  belly pain improved ? ?Vitals:  ? 09/29/21 2136 09/30/21 0532  ?BP: 125/62 (!) 145/70  ?Pulse: 94 79  ?Resp: 18 18  ?Temp: 98.9 ?F (37.2 ?C) 97.9 ?F (36.6 ?C)  ?SpO2: 93% 95%  ? ? ?Physical Exam: ?Aaox3 ?Non labored respirations ?Abdomen soft today, pd catheter with dressing in place ? ?CBC ?   ?Component Value Date/Time  ? WBC 6.9 09/30/2021 0227  ? RBC 2.60 (L) 09/30/2021 0227  ? HGB 9.2 (L) 09/30/2021 0227  ? HCT 25.9 (L) 09/30/2021 0227  ? PLT 239 09/30/2021 0227  ? MCV 99.6 09/30/2021 0227  ? MCH 35.4 (H) 09/30/2021 0227  ? MCHC 35.5 09/30/2021 0227  ? RDW 13.2 09/30/2021 0227  ? LYMPHSABS 1.5 09/08/2021 0527  ? MONOABS 1.2 (H) 09/08/2021 0527  ? EOSABS 0.4 09/08/2021 0527  ? BASOSABS 0.1 09/08/2021 0527  ? ? ?BMET ?   ?Component Value Date/Time  ? NA 131 (L) 09/30/2021 0227  ? K 3.3 (L) 09/30/2021 0227  ? CL 91 (L) 09/30/2021 0227  ? CO2 26 09/30/2021 0227  ? GLUCOSE 125 (H) 09/30/2021 0227  ? BUN 49 (H) 09/30/2021 0227  ? CREATININE 9.92 (H) 09/30/2021 0227  ? CALCIUM 7.9 (L) 09/30/2021 0227  ? CALCIUM 7.1 (L) 01/10/2019 1610  ? GFRNONAA 4 (L) 09/30/2021 0227  ? GFRAA 11 (L) 01/20/2019 9604  ? ? ?INR ?   ?Component Value Date/Time  ? INR 1.1 08/27/2021 0120  ? ? ? ?Intake/Output Summary (Last 24 hours) at 09/30/2021 0725 ?Last data filed at 09/30/2021 [id number] ?Gross per 24 hour  ?Intake 600 ml  ?Output 2291 ml  ?Net -1691 ml  ? ? ? ?Assessment:  81 y.o. female has esrd with sbp 2/2 pd catheter ? ?Plan: ?-removal of pd catheter today ?-will place tdc for hd, permanent access discussion to follow ? ?Sabrin Dunlevy C. Donzetta Matters, MD ?[facility] ?Office: [phone] ?Pager: [phone] ? ?09/30/2021 ?7:25 AM ? ?

## 2021-09-30 NOTE — Transfer of Care (Signed)
Immediate Anesthesia Transfer of Care Note ? ?Patient: Felicia Williams ? ?Procedure(s) Performed: REMOVAL OF A PERITONEAL DIALYSIS CATHETER (Abdomen) ?INSERTION OF TUNNELED DIALYSIS CATHETER (Right: Neck) ? ?Patient Location: PACU ? ?Anesthesia Type:General ? ?Level of Consciousness: awake, alert  and oriented ? ?Airway & Oxygen Therapy: Patient Spontanous Breathing ? ?Post-op Assessment: Report given to RN and Post -op Vital signs reviewed and stable ? ?Post vital signs: Reviewed and stable ? ?Last Vitals:  ?Vitals Value Taken Time  ?BP    ?Temp    ?Pulse 75 09/30/21 0904  ?Resp 13 09/30/21 0904  ?SpO2 96 % 09/30/21 0904  ?Vitals shown include unvalidated device data. ? ?Last Pain:  ?Vitals:  ? 09/30/21 0728  ?TempSrc:   ?PainSc: 0-No pain  ?   ? ?Patients Stated Pain Goal: 0 (09/29/21 1210) ? ?Complications: No notable events documented. ?

## 2021-09-30 NOTE — Anesthesia Procedure Notes (Signed)
Procedure Name: Intubation ?Date/Time: 09/30/2021 7:57 AM ?Performed by: Babs Bertin, CRNA ?Pre-anesthesia Checklist: Patient identified, Emergency Drugs available, Suction available and Patient being monitored ?Patient Re-evaluated:Patient Re-evaluated prior to induction ?Oxygen Delivery Method: Circle System Utilized ?Preoxygenation: Pre-oxygenation with 100% oxygen ?Induction Type: IV induction ?Ventilation: Mask ventilation without difficulty ?Laryngoscope Size: Mac and 3 ?Grade View: Grade II ?Tube type: Oral ?Tube size: 7.0 mm ?Number of attempts: 1 ?Airway Equipment and Method: Stylet and Oral airway ?Placement Confirmation: ETT inserted through vocal cords under direct vision, positive ETCO2 and breath sounds checked- equal and bilateral ?Secured at: 20 cm ?Tube secured with: Tape ?Dental Injury: Teeth and Oropharynx as per pre-operative assessment  ? ? ? ? ?

## 2021-09-30 NOTE — Anesthesia Preprocedure Evaluation (Addendum)
Anesthesia Evaluation  ?Patient identified by MRN, date of birth, ID band ?Patient awake ? ? ? ?Reviewed: ?Allergy & Precautions, H&P , NPO status , Patient's Chart, lab work & pertinent test results ? ?Airway ?Mallampati: III ? ?TM Distance: >3 FB ?Neck ROM: Full ? ? ? Dental ?no notable dental hx. ?(+) Teeth Intact, Dental Advisory Given ?  ?Pulmonary ?neg pulmonary ROS,  ?  ?Pulmonary exam normal ?breath sounds clear to auscultation ? ? ? ? ? ? Cardiovascular ?hypertension,  ?Rhythm:Regular Rate:Normal ? ? ?  ?Neuro/Psych ?Anxiety Depression negative neurological ROS ?   ? GI/Hepatic ?Neg liver ROS, GERD  Medicated,  ?Endo/Other  ?negative endocrine ROS ? Renal/GU ?ESRF and DialysisRenal disease  ?negative genitourinary ?  ?Musculoskeletal ? ?(+) Arthritis , Osteoarthritis,   ? Abdominal ?  ?Peds ? Hematology ?negative hematology ROS ?(+) Blood dyscrasia, anemia ,   ?Anesthesia Other Findings ? ? Reproductive/Obstetrics ?negative OB ROS ? ?  ? ? ? ? ? ? ? ? ? ? ? ? ? ?  ?  ? ? ? ? ? ? ? ?Anesthesia Physical ?Anesthesia Plan ? ?ASA: 3 ? ?Anesthesia Plan: General  ? ?Post-op Pain Management: Tylenol PO (pre-op)*  ? ?Induction: Intravenous ? ?PONV Risk Score and Plan: 4 or greater and Ondansetron, Dexamethasone and Treatment may vary due to age or medical condition ? ?Airway Management Planned: Oral ETT ? ?Additional Equipment:  ? ?Intra-op Plan:  ? ?Post-operative Plan: Extubation in OR ? ?Informed Consent: I have reviewed the patients History and Physical, chart, labs and discussed the procedure including the risks, benefits and alternatives for the proposed anesthesia with the patient or authorized representative who has indicated his/her understanding and acceptance.  ? ? ? ?Dental advisory given ? ?Plan Discussed with: CRNA ? ?Anesthesia Plan Comments:   ? ? ? ? ? ? ?Anesthesia Quick Evaluation ? ?

## 2021-09-30 NOTE — Progress Notes (Signed)
Pt c/o nausea and vomiting and had x 1 episode of moderate brown emesis. She received 10 mg iv Compazine which was effective. Rested quietly afterwards.  ?

## 2021-09-30 NOTE — Progress Notes (Signed)
?  ?  Patient evaluated post procedure doing very well with minimal neck pain.  Chest x-ray demonstrates good placement of TDC without pneumothorax.  Abdominal dressings can be taken down tomorrow.  Will defer permanent access until patient has recovered completely.  Please contact me if there are further issues while patient is here.  ? ?Servando Snare ?Pager: [phone] ?

## 2021-09-30 NOTE — Progress Notes (Signed)
PROGRESS NOTE    Felicia Williams  ZOX:[id number] DOB: 18-Aug-1940 DOA: 09/27/2021 PCP: [name], NP    Brief Narrative:  Felicia Williams is a 81 y.o. female with medical history significant of ESRD on PD, HTN, GERD who presents with abdominal pain.  She recently had prolonged hospitalization from 2/4 to 2/16 for sepsis secondary to peritonitis.  She had gram-positive cocci on Gram stain but fluid and blood culture were negative.  She was initially treated with cefepime but developed acute metabolic encephalopathy, myoclonic jerks with toxic metabolic encephalopathy seen on LTM EEG.  She was transitioned to 2 weeks of IV meropenem instead per ID.  She completed her course of antibiotics on 2/17. When she left rehab she was starting to have pain in her stomach again.  PD catheter removed and plan is now for HD.  Access placed.      Assessment and Plan: Sepsis ([place]) secondary to bacterial peritonitis -Presented with tachycardia and leukocytosis.  Negative CT abdomen and pelvis finding w/hx of ESRD on PD.  -pt had toxic metabolic encephalopathy thought to be due to cefepime and previous hospitalization -Continue IV vancomycin and meropenem.   -obtain blood and peritoneal fluid culture- done 3/9 after IV abx -pain control     ESRD on dialysis [facility]) -nephrology consult -PD catheter removed -needs HD -will need to be CLIPPED           DVT prophylaxis: enoxaparin (LOVENOX) injection 30 mg Start: 09/28/21 1000 SCDs Start: 09/28/21 0036    Code Status: Full Code Family Communication: at bedside  Disposition Plan:  Level of care: Telemetry Medical Status is: Inpatient Remains inpatient appropriate because: needs CLIPPED    Consultants:  Vascular nephrology     Subjective: Just back from OR getting vascular access  Objective: Vitals:   09/30/21 0904 09/30/21 0919 09/30/21 0934 09/30/21 0951  BP: (!) 127/51 (!) 121/49 (!) 120/46 (!) 124/51  Pulse: 75 73  74 74  Resp: 13 14 16 17   Temp: (!) 97.1 F (36.2 C)  97.8 F (36.6 C) 97.7 F (36.5 C)  TempSrc:    Oral  SpO2: 96% 95% 98% 96%  Weight:      Height:        Intake/Output Summary (Last 24 hours) at 09/30/2021 1034 Last data filed at 09/30/2021 0901 Gross per 24 hour  Intake 700 ml  Output 891 ml  Net -191 ml   Filed Weights   09/29/21 0500 09/29/21 1006 09/30/21 0628  Weight: 57.6 kg 53.8 kg 54 kg    Examination:   General: Appearance:    Well developed, well nourished female in no acute distress     Lungs:     respirations unlabored  Heart:    Normal heart rate.    MS:   All extremities are intact.    Neurologic:   Awake, alert, oriented x 3. No apparent focal neurological           defect.        Data Reviewed: I have personally reviewed following labs and imaging studies  CBC: Recent Labs  Lab 09/27/21 2222 09/28/21 0436 09/29/21 0302 09/30/21 0227  WBC 14.0* 12.2* 7.6 6.9  HGB 12.0 10.4* 9.3* 9.2*  HCT 35.6* 31.0* 26.5* 25.9*  MCV 107.2* 104.7* 101.5* 99.6  PLT 349 272 232 811   Basic Metabolic Panel: Recent Labs  Lab 09/27/21 2222 09/28/21 0436 09/29/21 0302 09/30/21 0227  NA 132* 134* 131* 131*  K 4.1 4.6 3.3* 3.3*  CL 93* 96* 94* 91*  CO2 22 21* 23 26  GLUCOSE 122* 107* 120* 125*  BUN 52* 53* 52* 49*  CREATININE 11.64* 11.46* 11.20* 9.92*  CALCIUM 9.3 8.5* 7.9* 7.9*  PHOS  --  6.0*  --   --    GFR: Estimated Creatinine Clearance: 3.3 mL/min (A) (by C-G formula based on SCr of 9.92 mg/dL (H)). Liver Function Tests: Recent Labs  Lab 09/27/21 2222  AST 29  ALT 25  ALKPHOS 103  BILITOT 0.5  PROT 7.2  ALBUMIN 3.3*   Recent Labs  Lab 09/27/21 2222  LIPASE 83*   No results for input(s): AMMONIA in the last 168 hours. Coagulation Profile: No results for input(s): INR, PROTIME in the last 168 hours. Cardiac Enzymes: No results for input(s): CKTOTAL, CKMB, CKMBINDEX, TROPONINI in the last 168 hours. BNP (last 3 results) No  results for input(s): PROBNP in the last 8760 hours. HbA1C: No results for input(s): HGBA1C in the last 72 hours. CBG: No results for input(s): GLUCAP in the last 168 hours. Lipid Profile: No results for input(s): CHOL, HDL, LDLCALC, TRIG, CHOLHDL, LDLDIRECT in the last 72 hours. Thyroid Function Tests: No results for input(s): TSH, T4TOTAL, FREET4, T3FREE, THYROIDAB in the last 72 hours. Anemia Panel: No results for input(s): VITAMINB12, FOLATE, FERRITIN, TIBC, IRON, RETICCTPCT in the last 72 hours. Sepsis Labs: No results for input(s): PROCALCITON, LATICACIDVEN in the last 168 hours.  Recent Results (from the past 240 hour(s))  Resp Panel by RT-PCR (Flu A&B, Covid) Nasopharyngeal Swab     Status: None   Collection Time: 09/27/21 11:10 PM   Specimen: Nasopharyngeal Swab; Nasopharyngeal(NP) swabs in vial transport medium  Result Value Ref Range Status   SARS Coronavirus 2 by RT PCR NEGATIVE NEGATIVE Final    Comment: (NOTE) SARS-CoV-2 target nucleic acids are NOT DETECTED.  The SARS-CoV-2 RNA is generally detectable in upper respiratory specimens during the acute phase of infection. The lowest concentration of SARS-CoV-2 viral copies this assay can detect is 138 copies/mL. A negative result does not preclude SARS-Cov-2 infection and should not be used as the sole basis for treatment or other patient management decisions. A negative result may occur with  improper specimen collection/handling, submission of specimen other than nasopharyngeal swab, presence of viral mutation(s) within the areas targeted by this assay, and inadequate number of viral copies(<138 copies/mL). A negative result must be combined with clinical observations, patient history, and epidemiological information. The expected result is Negative.  Fact Sheet for Patients:  EntrepreneurPulse.com.au  Fact Sheet for Healthcare Providers:  IncredibleEmployment.be  This test is  no t yet approved or cleared by the [place] FDA and  has been authorized for detection and/or diagnosis of SARS-CoV-2 by FDA under an Emergency Use Authorization (EUA). This EUA will remain  in effect (meaning this test can be used) for the duration of the COVID-19 declaration under Section 564(b)(1) of the Act, 21 U.S.C.section 360bbb-3(b)(1), unless the authorization is terminated  or revoked sooner.       Influenza A by PCR NEGATIVE NEGATIVE Final   Influenza B by PCR NEGATIVE NEGATIVE Final    Comment: (NOTE) The Xpert Xpress SARS-CoV-2/FLU/RSV plus assay is intended as an aid in the diagnosis of influenza from Nasopharyngeal swab specimens and should not be used as a sole basis for treatment. Nasal washings and aspirates are unacceptable for Xpert Xpress SARS-CoV-2/FLU/RSV testing.  Fact Sheet for Patients: EntrepreneurPulse.com.au  Fact Sheet for Healthcare Providers: IncredibleEmployment.be  This test is not  yet approved or cleared by the [place] and has been authorized for detection and/or diagnosis of SARS-CoV-2 by FDA under an Emergency Use Authorization (EUA). This EUA will remain in effect (meaning this test can be used) for the duration of the COVID-19 declaration under Section 564(b)(1) of the Act, 21 U.S.C. section 360bbb-3(b)(1), unless the authorization is terminated or revoked.  Performed at [facility], [place] [address], [place], [place] [address]   Culture, blood (routine x 2)     Status: None (Preliminary result)   Collection Time: 09/28/21 12:52 AM   Specimen: BLOOD RIGHT ARM  Result Value Ref Range Status   Specimen Description BLOOD RIGHT ARM  Final   Special Requests   Final    BOTTLES DRAWN AEROBIC AND ANAEROBIC Blood Culture results may not be optimal due to an excessive volume of blood received in culture bottles   Culture   Final    NO GROWTH 2 DAYS Performed at [facility],  [place] [address] [place], [place] [address]    Report Status PENDING  Incomplete  Culture, blood (routine x 2)     Status: None (Preliminary result)   Collection Time: 09/28/21 12:57 AM   Specimen: BLOOD LEFT ARM  Result Value Ref Range Status   Specimen Description BLOOD LEFT ARM  Final   Special Requests   Final    BOTTLES DRAWN AEROBIC AND ANAEROBIC Blood Culture results may not be optimal due to an excessive volume of blood received in culture bottles   Culture   Final    NO GROWTH 2 DAYS Performed at [facility], [place] [address] [place], [place] [address]    Report Status PENDING  Incomplete  Body fluid culture w Gram Stain     Status: None (Preliminary result)   Collection Time: 09/28/21 10:50 AM   Specimen: Peritoneal Dialysate; Body Fluid  Result Value Ref Range Status   Specimen Description PERITONEAL DIALYSATE  Final   Special Requests NONE  Final   Gram Stain   Final    ABUNDANT WBC PRESENT,BOTH PMN AND MONONUCLEAR RARE GRAM POSITIVE COCCI OBSERVED INTRACELLULARLY AND EXTRACELLULARLY    Culture   Final    NO GROWTH < 24 HOURS Performed at [facility], [place] [address], [place], [place] [address]    Report Status PENDING  Incomplete         Radiology Studies: DG CHEST PORT 1 VIEW  Result Date: 09/30/2021 CLINICAL DATA:  End-stage renal disease.  Shortness of breath. EXAM: PORTABLE CHEST 1 VIEW COMPARISON:  08/28/2021 and prior radiographs FINDINGS: The cardiomediastinal silhouette is unremarkable. A RIGHT IJ central venous catheter is noted with tip overlying the SUPERIOR cavoatrial junction. Mild bibasilar atelectasis/scarring is improved. No pneumothorax or large pleural effusion identified. IMPRESSION: No evidence of acute cardiopulmonary disease. Improved bibasilar atelectasis/scarring. Electronically Signed   By: Margarette Canada M.D.   On: 09/30/2021 09:59        Scheduled Meds:  acetaminophen       chlorhexidine       Chlorhexidine Gluconate Cloth  6  each Topical Daily   enoxaparin (LOVENOX) injection  30 mg Subcutaneous Q24H   gentamicin cream  1 application. Topical Daily   sevelamer carbonate  2.4 g Oral TID WC   vancomycin variable dose per unstable renal function (pharmacist dosing)   Does not apply See admin instructions   Continuous Infusions:  dialysis solution 1.5% low-MG/low-CA     meropenem (MERREM)  IV 500 mg (09/29/21 2259)     LOS: 3 days    Time spent: 75 minutes spent on chart review, discussion with nursing staff, consultants, updating family and interview/physical exam; more than 50% of that time was spent in counseling and/or coordination of care.    Geradine Girt, DO [facility] Available via Epic secure chat 7am-7pm After these hours, please refer to coverage provider listed on [email or web address] 09/30/2021, 10:34 AM

## 2021-09-30 NOTE — Progress Notes (Signed)
[facility] ?Progress Note ? ?Subjective: appears calm and stable post op today ? ?Vitals:  ? 09/30/21 0904 09/30/21 0919 09/30/21 0934 09/30/21 0951  ?BP: (!) 127/51 (!) 121/49 (!) 120/46 (!) 124/51  ?Pulse: 75 73 74 74  ?Resp: 13 14 16 17   ?Temp: (!) 97.1 ?F (36.2 ?C)  97.8 ?F (36.6 ?C) 97.7 ?F (36.5 ?C)  ?TempSrc:    Oral  ?SpO2: 96% 95% 98% 96%  ?Weight:      ?Height:      ? ? ?Exam: ? alert, nad , post op , in good spirits ? no jvd ? Chest cta bilat ? Cor reg no RG ? Abd did not examine post op today ?  Ext no LE edema ?  Alert, NF, ox3 ?  New RIJ TDC ?   ? ? ?Home meds include - norco prn, renvela 3 ac tid, rena vit, trazodone, pepcid ?  ?  ? OP PD: 5 exchanges overnight, 2.5 L vol, usually hangs 2- 6L 2.5% bags and 1 - 3 L 1.5% bag. Does last fill w/ icodextrin for daybag.  Dry wt 57kg. ?  ?  ?  ?  ?Assessment/ Plan: ?Abdominal pain/ recurrent PD catheter related peritonitis - hx of recent culture neg peritonitis in Feb 2023 treated w/ 14 days of IV abx. Pt returned now w/ recurrent abd pain. PD fluid showed 10,290 cell ct w/ 93% segs consistent w/ recurrent peritonitis. Pain and exam were worsening 3/20 and VVS was consulted for PD cath removal which was done this morning. Randall placed as well. Appreciate VVS assistance. Cont IV abx's for now.   ?ESRD - on [facility] w/ [facility] in [place], [place]. F/b [facility] group. Plan HD today, gentle session, with new TDC.  ?HTN/ vol - BP's wnl, euvolemic on exam. Under dry wt.  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 Phos 6, stable.   ?  ?  ? ? ? ? ?Rob Doctor, hospital ?09/30/2021, 1:38 PM ? ? ?Recent Labs  ?Lab 09/27/21 ?3151 09/28/21 ?0436 09/29/21 ?0302 09/30/21 ?7616  ?HGB 12.0 10.4* 9.3* 9.2*  ?ALBUMIN 3.3*  --   --   --   ?CALCIUM 9.3 8.5* 7.9* 7.9*  ?PHOS  --  6.0*  --   --   ?CREATININE 11.64* 11.46* 11.20* 9.92*  ?K 4.1 4.6 3.3* 3.3*  ? ? ?Inpatient medications: ? acetaminophen      ? chlorhexidine      ? Chlorhexidine Gluconate Cloth   6 each Topical Daily  ? enoxaparin (LOVENOX) injection  30 mg Subcutaneous Q24H  ? gentamicin cream  1 application. Topical Daily  ? sevelamer carbonate  2.4 g Oral TID WC  ? vancomycin variable dose per unstable renal function (pharmacist dosing)   Does not apply See admin instructions  ? ? dialysis solution 1.5% low-MG/low-CA    ? meropenem (MERREM) IV 500 mg (09/29/21 2259)  ? ?acetaminophen, fentaNYL (SUBLIMAZE) injection **OR** fentaNYL (SUBLIMAZE) injection, HYDROcodone-acetaminophen, hydrOXYzine, prochlorperazine, traZODone ? ? ? ? ? ? ?

## 2021-09-30 NOTE — Anesthesia Postprocedure Evaluation (Signed)
Anesthesia Post Note ? ?Patient: Felicia Williams ? ?Procedure(s) Performed: REMOVAL OF A PERITONEAL DIALYSIS CATHETER (Abdomen) ?INSERTION OF TUNNELED DIALYSIS CATHETER (Right: Neck) ? ?  ? ?Patient location during evaluation: PACU ?Anesthesia Type: General ?Level of consciousness: awake and alert ?Pain management: pain level controlled ?Vital Signs Assessment: post-procedure vital signs reviewed and stable ?Respiratory status: spontaneous breathing, nonlabored ventilation and respiratory function stable ?Cardiovascular status: blood pressure returned to baseline and stable ?Postop Assessment: no apparent nausea or vomiting ?Anesthetic complications: no ? ? ?No notable events documented. ? ?Last Vitals:  ?Vitals:  ? 09/30/21 0934 09/30/21 0951  ?BP: (!) 120/46 (!) 124/51  ?Pulse: 74 74  ?Resp: 16 17  ?Temp: 36.6 ?C 36.5 ?C  ?SpO2: 98% 96%  ?  ?Last Pain:  ?Vitals:  ? 09/30/21 0951  ?TempSrc: Oral  ?PainSc:   ? ? ?  ?  ?  ?  ?  ?  ? ?Athel Merriweather,W. [place] ? ? ? ? ?

## 2021-10-01 ENCOUNTER — Encounter (HOSPITAL_COMMUNITY): Payer: Self-pay | Admitting: Vascular Surgery

## 2021-10-01 LAB — BASIC METABOLIC PANEL
Anion gap: 10 (ref 5–15)
BUN: 20 mg/dL (ref 8–23)
CO2: 25 mmol/L (ref 22–32)
Calcium: 8.2 mg/dL — ABNORMAL LOW (ref 8.9–10.3)
Chloride: 99 mmol/L (ref 98–111)
Creatinine, Ser: 4.55 mg/dL — ABNORMAL HIGH (ref 0.44–1.00)
GFR, Estimated: 9 mL/min — ABNORMAL LOW (ref >60)
Glucose, Bld: 116 mg/dL — ABNORMAL HIGH (ref 70–99)
Potassium: 3.7 mmol/L (ref 3.5–5.1)
Sodium: 134 mmol/L — ABNORMAL LOW (ref 135–145)

## 2021-10-01 LAB — HEPATITIS B SURFACE ANTIGEN: Hepatitis B Surface Ag: NONREACTIVE

## 2021-10-01 LAB — HEPATITIS B SURFACE ANTIBODY,QUALITATIVE: Hep B S Ab: NONREACTIVE

## 2021-10-01 LAB — VANCOMYCIN, RANDOM: Vancomycin Rm: 15

## 2021-10-01 MED ORDER — VANCOMYCIN HCL 500 MG/100ML IV SOLN
500.0000 mg | Freq: Once | INTRAVENOUS | Status: AC
  Administered 2021-10-02: 500 mg via INTRAVENOUS
  Filled 2021-10-01 (×2): qty 100

## 2021-10-01 MED ORDER — HEPARIN SODIUM (PORCINE) 5000 UNIT/ML IJ SOLN
5000.0000 [IU] | Freq: Three times a day (TID) | INTRAMUSCULAR | Status: DC
  Administered 2021-10-02 – 2021-10-03 (×2): 5000 [IU] via SUBCUTANEOUS
  Filled 2021-10-01 (×2): qty 1

## 2021-10-01 NOTE — Evaluation (Signed)
Physical Therapy Evaluation Patient Details Name: Felicia Williams MRN: [id number] DOB: April 02, 1941 Today's Date: 10/01/2021  History of Present Illness  Pt is a 81 y.o. F who presents 10/01/2021 with ESRD on [place], abdominal pain, and peritonitis. Pt PMH includes HTN, HLD, osteoporosis, and spinal stenosis. Pt was motivated to get moving after receiving dialysis - 09/30/2021 - and returned at midnight last night. Pt was moving independently, and reported weakness in LEs. Pt reported no pain in abdominal area during session, but did reported minimal pain in lower back towards the end of the walk. Pt goals include going home and overcoming peritonitis.  Clinical Impression   Pt admitted with above diagnosis. Lives at home with her granddaughter (home around the clock until she returns to school), in a single-level home with a few steps to enter; Prior to admission, pt was able to manage independently (has enjoyed cornhole recently until onset of back pain);   Presents to PT with low back pain with progressive ambulation, new to HD with the need for further assessment of her response and activity tolerance post HD;  Able to walk the hallways without physical assistance and with no overt loss of balance, though onset of back pain; Pt indicated she has been working with HHPT, but her spine doctor would like to fully workup back pain assesment before re-starting PT; Pt currently with functional limitations due to the deficits listed below (see PT Problem List). Pt will benefit from skilled PT to increase their independence and safety with mobility to allow discharge to the venue listed below.       Recommend undergoing HD in recliner in prep for Outpt HD     Recommendations for follow up therapy are one component of a multi-disciplinary discharge planning process, led by the attending physician.  Recommendations may be updated based on patient status, additional functional criteria and insurance  authorization.  Follow Up Recommendations No PT follow up -- possibly Outpt PT for back pain (The potential need for Outpatient PT can be addressed at her spine doctor follow-up appointments. )    Assistance Recommended at Discharge PRN  Patient can return home with the following  Assist for transportation (REcommend pt get rides home from HD until she knows more about her activity tolerance post HD)    Equipment Recommendations Rollator (4 wheels) (will consider a Rollator; might be helfpul if she is fataigued and exhausted post HD)  Recommendations for Other Services   (Will consider OT)    Functional Status Assessment Patient has had a recent decline in their functional status and demonstrates the ability to make significant improvements in function in a reasonable and predictable amount of time.     Precautions / Restrictions Precautions Precautions: None Restrictions Weight Bearing Restrictions: No      Mobility  Bed Mobility Overal bed mobility: Independent Bed Mobility: Supine to Sit     Supine to sit: Independent     General bed mobility comments: Pt laying with HOB elevated upon my arrival    Transfers Overall transfer level: Independent Equipment used: None Transfers: Sit to/from Stand Sit to Stand: Independent           General transfer comment: Pt was able to sit from the EOB and move to stand without any assistance.    Ambulation/Gait Ambulation/Gait assistance: Supervision (Able to walk without a gait belt; held PT until pt was rested after HD late the night before) Gait Distance (Feet): 200 Feet Assistive device: None Gait Pattern/deviations: Step-through pattern  Gait velocity: decreased     General Gait Details: Cues to self-monitor for activitytolerance  Stairs            Wheelchair Mobility    Modified Rankin (Stroke Patients Only)       Balance Overall balance assessment: Modified Independent                                            Pertinent Vitals/Pain Pain Assessment Pain Assessment: Faces Faces Pain Scale: Hurts a little bit Pain Location: [place] back pain Pain Descriptors / Indicators: Sore Pain Intervention(s): Monitored during session, Repositioned    Home Living Family/patient expects to be discharged to:: Private residence Living Arrangements: Other relatives ([place] daughter) Available Help at Discharge: Family Type of Home: House (One story) Home Access: Stairs to enter Entrance Stairs-Rails: None Entrance Stairs-Number of Steps: 1   Home Layout: Able to live on main level with bedroom/bathroom;Laundry or work area in basement [employer] daughter helps 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))        Prior Function Prior Level of Function : Independent/Modified Independent;Driving                     Hand Dominance   Dominant Hand: Right    Extremity/Trunk Assessment   Upper Extremity Assessment Upper Extremity Assessment:  (for simple mobility tasks)    Lower Extremity Assessment Lower Extremity Assessment: Generalized weakness       Communication   Communication: No difficulties  Cognition Arousal/Alertness: Awake/alert Behavior During Therapy: WFL for tasks assessed/performed Overall Cognitive Status: Within Functional Limits for tasks assessed                                          General Comments General comments (skin integrity, edema, etc.): Pt took time to describe her recent history of needing dialyisis, and her bouts with peritonitis; she is able to state understanding of the need to assess activity tolerance post HD, and she is aware of need to undergo HD in the recliner to prep for home and going to outpt HD    Exercises     Assessment/Plan    PT Assessment Patient needs continued PT services (Pt demonstrated LE weakness and endurance. Pt would benefit from skilled Pt to improve strength in LEs to increase  safety and independence while at home and in the community.)  PT Problem List Decreased strength;Decreased balance;Decreased activity tolerance;Decreased mobility;Decreased knowledge of use of DME;Decreased safety awareness;Decreased knowledge of precautions;Pain (Decreased endurance)       PT Treatment Interventions Gait training;Functional mobility training;Therapeutic exercise;Balance training;Patient/family education    PT Goals (Current goals can be found in the Care Plan section)  Acute Rehab PT Goals Patient Stated Goal: "I'm a fighter"; to keep working on her independence PT Goal Formulation: With patient Time For Goal Achievement: 10/15/21 Potential to Achieve Goals: Good Additional Goals Additional Goal #1: Pt will tolerate sitting OOB for 5 hours to approximate sitting time for Outpt HD    Frequency Min 3X/week     Co-evaluation               AM-PAC PT "6 Clicks" Mobility  Outcome Measure Help needed turning from your back to your side while in a flat bed  without using bedrails?: None Help needed moving from lying on your back to sitting on the side of a flat bed without using bedrails?: None Help needed moving to and from a bed to a chair (including a wheelchair)?: None Help needed standing up from a chair using your arms (e.g., wheelchair or bedside chair)?: None Help needed to walk in hospital room?: A Little Help needed climbing 3-5 steps with a railing? : A Little 6 Click Score: 22    End of Session Equipment Utilized During Treatment: Other (comment) (None) Activity Tolerance: Patient limited by fatigue (LBP towards the end of walk. No abdominal pain.) Patient left: in chair;with chair alarm set;with call bell/phone within reach Nurse Communication: Mobility status PT Visit Diagnosis: Muscle weakness (generalized) (M62.81);Other abnormalities of gait and mobility (R26.89) Pain - part of body:  (center low back)    Time: 1415-1435 PT Time Calculation  (min) (ACUTE ONLY): 20 min   Charges:   PT Evaluation $PT Eval Low Complexity: [place], PT  Acute Rehabilitation Services Pager [phone] Office [place] 10/01/2021, 6:03 PM

## 2021-10-01 NOTE — Plan of Care (Signed)

## 2021-10-01 NOTE — Progress Notes (Addendum)
?  Progress Note ? ? ? ?10/01/2021 ?11:32 AM ?1 Day Post-Op ? ?Subjective: No overnight issues ? ?Vitals:  ? 10/01/21 0523 10/01/21 0942  ?BP: 136/69 130/60  ?Pulse: 87 90  ?Resp: 18 17  ?Temp: 98.5 ?F (36.9 ?C) 98.1 ?F (36.7 ?C)  ?SpO2: 98% 99%  ? ? ?Physical Exam: ?Awake alert oriented ?Nonlabored respirations ?Right neck without hematoma, catheter in place ?Abdomen is soft and much less tender this morning, dressings are clean dry intact ? ?CBC ?   ?Component Value Date/Time  ? WBC 6.9 09/30/2021 0227  ? RBC 2.60 (L) 09/30/2021 0227  ? HGB 9.2 (L) 09/30/2021 0227  ? HCT 25.9 (L) 09/30/2021 0227  ? PLT 239 09/30/2021 0227  ? MCV 99.6 09/30/2021 0227  ? MCH 35.4 (H) 09/30/2021 0227  ? MCHC 35.5 09/30/2021 0227  ? RDW 13.2 09/30/2021 0227  ? LYMPHSABS 1.5 09/08/2021 0527  ? MONOABS 1.2 (H) 09/08/2021 0527  ? EOSABS 0.4 09/08/2021 0527  ? BASOSABS 0.1 09/08/2021 0527  ? ? ?BMET ?   ?Component Value Date/Time  ? NA 134 (L) 10/01/2021 0733  ? K 3.7 10/01/2021 0733  ? CL 99 10/01/2021 0733  ? CO2 25 10/01/2021 0733  ? GLUCOSE 116 (H) 10/01/2021 0733  ? BUN 20 10/01/2021 0733  ? CREATININE 4.55 (H) 10/01/2021 0539  ? CALCIUM 8.2 (L) 10/01/2021 0733  ? CALCIUM 7.1 (L) 01/10/2019 7673  ? GFRNONAA 9 (L) 10/01/2021 4193  ? GFRAA 11 (L) 01/20/2019 7902  ? ? ?INR ?   ?Component Value Date/Time  ? INR 1.1 08/27/2021 0120  ? ? ? ?Intake/Output Summary (Last 24 hours) at 10/01/2021 1132 ?Last data filed at 10/01/2021 0900 ?Gross per 24 hour  ?Intake 1000 ml  ?Output 300 ml  ?Net 700 ml  ? ? ? ?Assessment:  81 y.o. female is s/p removal of PD catheter and tunneled dialysis catheter placement. ? ?Plan: ?-We will ultimately permanent dialysis access but will wait until she has recovered.  She did have vein mapping in 2020 with no suitable vein for fistula creation and would likely require upper extremity graft. ?-Dressings can be removed from abdomen ? ?Tyrease Vandeberg C. Donzetta Matters, MD ?[facility] ?Office:  [phone] ?Pager: [phone] ? ?10/01/2021 ?11:32 AM ? ?

## 2021-10-01 NOTE — Progress Notes (Addendum)
[facility] ?Progress Note ? ?Subjective: not seen as sleeping, per pmd abd pain is better. Cx + for staph epi ? ?Vitals:  ? 09/30/21 2309 09/30/21 2339 10/01/21 0523 10/01/21 0942  ?BP: (!) 131/53 136/61 136/69 130/60  ?Pulse:  99 87 90  ?Resp: 12 18 18 17   ?Temp: 98.4 ?F (36.9 ?C) 98.3 ?F (36.8 ?C) 98.5 ?F (36.9 ?C) 98.1 ?F (36.7 ?C)  ?TempSrc: Tympanic Oral Oral Oral  ?SpO2: 100% 97% 98% 99%  ?Weight: 57 kg     ?Height:      ? ? ?Exam: ? Patient not seen directly today utilizing data taken from chart +/- discussions w/ providers and staff.  ? ?   ? ? ?Home meds include - norco prn, renvela 3 ac tid, rena vit, trazodone, pepcid ?  ?  ? OP PD: 5 exchanges overnight, 2.5 L vol, usually hangs 2- 6L 2.5% bags and 1 - 3 L 1.5% bag. Does last fill w/ icodextrin for daybag.  Dry wt 57kg. ? - Aug 27, 2021 >> hep B SAg neg, Ab's < 3.1 = not protective ?  ?  ?  ?Assessment/ Plan: ?Abdominal pain/ recurrent PD catheter related peritonitis - hx of recent culture neg peritonitis in Feb 2023 treated w/ 14 days of IV abx. Pt returned now w/ recurrent abd pain. PD fluid showed 10,290 cell ct w/ 93% segs consistent w/ recurrent peritonitis. Pain and exam were worsening 3/20 and [facility] was consulted for PD cath removal which was done 3/84 w/o complications. Pain is better. Cx's are growing staph epi, IV abx to be adjusted per pmd. TDC was placed as well 3/11 and pt had HD yesterday. Appreciate [facility] assistance.  ESRD - was on PD, now transitioned to HD. F/b [facility] group. Plan HD #2 tomorrow. Keep even. Start CLIP process to outpt [facility] unit tomorrow. Will hold off on AV graft til patient recovers from this episode. Anticipate d/c probably in 1-2 days after we can get her a spot at a [facility] HD unit.  ?HTN/ vol - BP's wnl, euvolemic on exam. Is at dry wt.  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 Phos 6, stable.   ?  ?  ? ? ? ? ?Rob Doctor, hospital ?10/01/2021, 2:32 PM ? ? ?Recent Labs  ?Lab  09/27/21 ?2222 09/28/21 ?0436 09/29/21 ?0302 09/30/21 ?6659 10/01/21 ?9357  ?HGB 12.0 10.4* 9.3* 9.2*  --   ?ALBUMIN 3.3*  --   --   --   --   ?CALCIUM 9.3 8.5* 7.9* 7.9* 8.2*  ?PHOS  --  6.0*  --   --   --   ?CREATININE 11.64* 11.46* 11.20* 9.92* 4.55*  ?K 4.1 4.6 3.3* 3.3* 3.7  ? ? ?Inpatient medications: ? Chlorhexidine Gluconate Cloth  6 each Topical Q0600  ? [START ON 10/02/2021] heparin injection (subcutaneous)  5,000 Units Subcutaneous Q8H  ? sevelamer carbonate  2.4 g Oral TID WC  ? vancomycin variable dose per unstable renal function (pharmacist dosing)   Does not apply See admin instructions  ? ? meropenem (MERREM) IV 500 mg (10/01/21 0100)  ? ?acetaminophen, fentaNYL (SUBLIMAZE) injection **OR** fentaNYL (SUBLIMAZE) injection, HYDROcodone-acetaminophen, hydrOXYzine, prochlorperazine, traZODone ? ? ? ? ? ? ?

## 2021-10-01 NOTE — Progress Notes (Signed)
Pharmacy Antibiotic Note ? ?Felicia Williams is a 81 y.o. female with ESRD on peritoneal dialysis admitted on 09/27/2021 with sepsis, possible SBP.  Pharmacy has been consulted for Vancomycin dosing. Vancomycin 1 g IV given in ED at 0120 3/9. Patient currently afebrile, WBC WNL. Patient's PD cath removed and started on HD 3/11.  ? ?Plan: ?STOP meropenem  ?Vanc 500 mg ordered with HD 3/13 ?- VR 15 on 3/12 s/p 3h HD (BFR 300) on 3/11  ?- New HD- F/U HD tolerability and schedule  ?- F/u c/s, abx de-esc plan/LOT  ? ?Height: 4\' 11"  (149.9 cm) ?Weight: 57 kg (125 lb 10.6 oz) ?IBW/kg (Calculated) : 43.2 ? ?Temp (24hrs), Avg:98.4 ?F (36.9 ?C), Min:98.1 ?F (36.7 ?C), Max:98.7 ?F (37.1 ?C) ? ?Recent Labs  ?Lab 09/27/21 ?2222 09/28/21 ?0436 09/29/21 ?0302 09/30/21 ?9024 10/01/21 ?0973  ?WBC 14.0* 12.2* 7.6 6.9  --   ?CREATININE 11.64* 11.46* 11.20* 9.92* 4.55*  ?VANCORANDOM  --   --   --   --  15  ? ?  ?Estimated Creatinine Clearance: 7.5 mL/min (A) (by C-G formula based on SCr of 4.55 mg/dL (H)).   ? ?Allergies  ?Allergen Reactions  ? Cefepime Other (See Comments)  ?  Feb 2023 Encephalopathy with questionable seizures. Seems to be tolerating cefazolin   ? Morphine   ?  "Dry heaving like crazy"  ? Sulfa Antibiotics Other (See Comments)  ?  Shut pt's kidneys down  ? ?Antimicrobials this admission: ?3/9 meropenem >>3/12 ?3/9 vanc >> ? ?Microbiology Results:  ?3/9 BCx : NGTD  ?3/9 fluid cx: rare staph epi w/ high nucleated cell count, and neutrophil %  ? ?Adria Dill, PharmD ?PGY-1 Acute Care Resident  ?10/01/2021 3:14 PM  ? ? ?

## 2021-10-01 NOTE — Plan of Care (Signed)
  Problem: Nutrition: Goal: Adequate nutrition will be maintained Outcome: Progressing   Problem: Pain Managment: Goal: General experience of comfort will improve Outcome: Progressing   

## 2021-10-01 NOTE — Progress Notes (Addendum)
PROGRESS NOTE    [name]  PZW:[id number] DOB: 1941/02/01 DOA: 09/27/2021 PCP: [name], NP    Brief Narrative:  Felicia Williams is a 81 y.o. female with medical history significant of ESRD on PD, HTN, GERD who presents with abdominal pain.  She recently had prolonged hospitalization from 2/4 to 2/16 for sepsis secondary to peritonitis.  She had gram-positive cocci on Gram stain but fluid and blood culture were negative.  She was initially treated with cefepime but developed acute metabolic encephalopathy, myoclonic jerks with toxic metabolic encephalopathy seen on LTM EEG.  She was transitioned to 2 weeks of IV meropenem instead per ID.  She completed her course of antibiotics on 2/17. When she left rehab she was starting to have pain in her stomach again.  PD catheter removed and plan is now for HD.  Access placed.  Got first HD 3/11 now needs to be CLIPPED.     Assessment and Plan: Sepsis ([place]) secondary to bacterial peritonitis -Presented with tachycardia and leukocytosis.  Negative CT abdomen and pelvis finding w/hx of ESRD on PD.  -pt had toxic metabolic encephalopathy thought to be due to cefepime and previous hospitalization -Continue IV vancomycin and meropenem- narrow as cultures finalize  -obtain blood and peritoneal fluid culture- done 3/9 after IV abx-- some staph epi growing -pain much improved     ESRD on dialysis [facility]) -nephrology consult -PD catheter removed -needs HD -will need to be CLIPPED           DVT prophylaxis: heparin injection 5,000 Units Start: 10/02/21 1000 SCDs Start: 09/28/21 0036    Code Status: Full Code Family Communication: at bedside  Disposition Plan:  Level of care: Telemetry Medical Status is: Inpatient Remains inpatient appropriate because: needs CLIPPED    Consultants:  Vascular nephrology     Subjective: Pain improved  Objective: Vitals:   09/30/21 2309 09/30/21 2339 10/01/21 0523 10/01/21 0942   BP: (!) 131/53 136/61 136/69 130/60  Pulse:  99 87 90  Resp: 12 18 18 17   Temp: 98.4 F (36.9 C) 98.3 F (36.8 C) 98.5 F (36.9 C) 98.1 F (36.7 C)  TempSrc: Tympanic Oral Oral Oral  SpO2: 100% 97% 98% 99%  Weight: 57 kg     Height:        Intake/Output Summary (Last 24 hours) at 10/01/2021 1301 Last data filed at 10/01/2021 0900 Gross per 24 hour  Intake 970 ml  Output 300 ml  Net 670 ml   Filed Weights   09/30/21 0628 09/30/21 1939 09/30/21 2309  Weight: 54 kg 57.1 kg 57 kg    Examination:   General: Appearance:    Well developed, well nourished female in no acute distress     Lungs:     respirations unlabored  Heart:    Normal heart rate.    MS:   All extremities are intact.    Neurologic:   Awake, alert, oriented x 3. No apparent focal neurological           defect.        Data Reviewed: I have personally reviewed following labs and imaging studies  CBC: Recent Labs  Lab 09/27/21 2222 09/28/21 0436 09/29/21 0302 09/30/21 0227  WBC 14.0* 12.2* 7.6 6.9  HGB 12.0 10.4* 9.3* 9.2*  HCT 35.6* 31.0* 26.5* 25.9*  MCV 107.2* 104.7* 101.5* 99.6  PLT 349 272 232 423   Basic Metabolic Panel: Recent Labs  Lab 09/27/21 2222 09/28/21 0436 09/29/21 0302 09/30/21 0227 10/01/21  0733  NA 132* 134* 131* 131* 134*  K 4.1 4.6 3.3* 3.3* 3.7  CL 93* 96* 94* 91* 99  CO2 22 21* 23 26 25   GLUCOSE 122* 107* 120* 125* 116*  BUN 52* 53* 52* 49* 20  CREATININE 11.64* 11.46* 11.20* 9.92* 4.55*  CALCIUM 9.3 8.5* 7.9* 7.9* 8.2*  PHOS  --  6.0*  --   --   --    GFR: Estimated Creatinine Clearance: 7.5 mL/min (A) (by C-G formula based on SCr of 4.55 mg/dL (H)). Liver Function Tests: Recent Labs  Lab 09/27/21 2222  AST 29  ALT 25  ALKPHOS 103  BILITOT 0.5  PROT 7.2  ALBUMIN 3.3*   Recent Labs  Lab 09/27/21 2222  LIPASE 83*   No results for input(s): AMMONIA in the last 168 hours. Coagulation Profile: No results for input(s): INR, PROTIME in the last 168  hours. Cardiac Enzymes: No results for input(s): CKTOTAL, CKMB, CKMBINDEX, TROPONINI in the last 168 hours. BNP (last 3 results) No results for input(s): PROBNP in the last 8760 hours. HbA1C: No results for input(s): HGBA1C in the last 72 hours. CBG: No results for input(s): GLUCAP in the last 168 hours. Lipid Profile: No results for input(s): CHOL, HDL, LDLCALC, TRIG, CHOLHDL, LDLDIRECT in the last 72 hours. Thyroid Function Tests: No results for input(s): TSH, T4TOTAL, FREET4, T3FREE, THYROIDAB in the last 72 hours. Anemia Panel: No results for input(s): VITAMINB12, FOLATE, FERRITIN, TIBC, IRON, RETICCTPCT in the last 72 hours. Sepsis Labs: No results for input(s): PROCALCITON, LATICACIDVEN in the last 168 hours.  Recent Results (from the past 240 hour(s))  Resp Panel by RT-PCR (Flu A&B, Covid) Nasopharyngeal Swab     Status: None   Collection Time: 09/27/21 11:10 PM   Specimen: Nasopharyngeal Swab; Nasopharyngeal(NP) swabs in vial transport medium  Result Value Ref Range Status   SARS Coronavirus 2 by RT PCR NEGATIVE NEGATIVE Final    Comment: (NOTE) SARS-CoV-2 target nucleic acids are NOT DETECTED.  The SARS-CoV-2 RNA is generally detectable in upper respiratory specimens during the acute phase of infection. The lowest concentration of SARS-CoV-2 viral copies this assay can detect is 138 copies/mL. A negative result does not preclude SARS-Cov-2 infection and should not be used as the sole basis for treatment or other patient management decisions. A negative result may occur with  improper specimen collection/handling, submission of specimen other than nasopharyngeal swab, presence of viral mutation(s) within the areas targeted by this assay, and inadequate number of viral copies(<138 copies/mL). A negative result must be combined with clinical observations, patient history, and epidemiological information. The expected result is Negative.  Fact Sheet for Patients:   EntrepreneurPulse.com.au  Fact Sheet for Healthcare Providers:  IncredibleEmployment.be  This test is no t yet approved or cleared by the [place] FDA and  has been authorized for detection and/or diagnosis of SARS-CoV-2 by FDA under an Emergency Use Authorization (EUA). This EUA will remain  in effect (meaning this test can be used) for the duration of the COVID-19 declaration under Section 564(b)(1) of the Act, 21 U.S.C.section 360bbb-3(b)(1), unless the authorization is terminated  or revoked sooner.       Influenza A by PCR NEGATIVE NEGATIVE Final   Influenza B by PCR NEGATIVE NEGATIVE Final    Comment: (NOTE) The Xpert Xpress SARS-CoV-2/FLU/RSV plus assay is intended as an aid in the diagnosis of influenza from Nasopharyngeal swab specimens and should not be used as a sole basis for treatment. Nasal washings and aspirates  are unacceptable for Xpert Xpress SARS-CoV-2/FLU/RSV testing.  Fact Sheet for Patients: EntrepreneurPulse.com.au  Fact Sheet for Healthcare Providers: IncredibleEmployment.be  This test is not yet approved or cleared by the [place] FDA and has been authorized for detection and/or diagnosis of SARS-CoV-2 by FDA under an Emergency Use Authorization (EUA). This EUA will remain in effect (meaning this test can be used) for the duration of the COVID-19 declaration under Section 564(b)(1) of the Act, 21 U.S.C. section 360bbb-3(b)(1), unless the authorization is terminated or revoked.  Performed at [facility], [place] [address], [place], [place] [address]   Culture, blood (routine x 2)     Status: None (Preliminary result)   Collection Time: 09/28/21 12:52 AM   Specimen: BLOOD RIGHT ARM  Result Value Ref Range Status   Specimen Description BLOOD RIGHT ARM  Final   Special Requests   Final    BOTTLES DRAWN AEROBIC AND ANAEROBIC Blood Culture results may not be optimal  due to an excessive volume of blood received in culture bottles   Culture   Final    NO GROWTH 3 DAYS Performed at [facility], [place] [address] [place], [place] [address]    Report Status PENDING  Incomplete  Culture, blood (routine x 2)     Status: None (Preliminary result)   Collection Time: 09/28/21 12:57 AM   Specimen: BLOOD LEFT ARM  Result Value Ref Range Status   Specimen Description BLOOD LEFT ARM  Final   Special Requests   Final    BOTTLES DRAWN AEROBIC AND ANAEROBIC Blood Culture results may not be optimal due to an excessive volume of blood received in culture bottles   Culture   Final    NO GROWTH 3 DAYS Performed at [facility], [place] [address], [place], [place] [address]    Report Status PENDING  Incomplete  Body fluid culture w Gram Stain     Status: None (Preliminary result)   Collection Time: 09/28/21 10:50 AM   Specimen: Peritoneal Dialysate; Body Fluid  Result Value Ref Range Status   Specimen Description PERITONEAL DIALYSATE  Final   Special Requests NONE  Final   Gram Stain   Final    ABUNDANT WBC PRESENT,BOTH PMN AND MONONUCLEAR RARE GRAM POSITIVE COCCI OBSERVED INTRACELLULARLY AND EXTRACELLULARLY    Culture   Final    FEW STAPHYLOCOCCUS EPIDERMIDIS SUSCEPTIBILITIES TO FOLLOW Performed at [facility], [place] [address] [place],  [address]    Report Status PENDING  Incomplete         Radiology Studies: DG CHEST PORT 1 VIEW  Result Date: 09/30/2021 CLINICAL DATA:  End-stage renal disease.  Shortness of breath. EXAM: PORTABLE CHEST 1 VIEW COMPARISON:  08/28/2021 and prior radiographs FINDINGS: The cardiomediastinal silhouette is unremarkable. A RIGHT IJ central venous catheter is noted with tip overlying the SUPERIOR cavoatrial junction. Mild bibasilar atelectasis/scarring is improved. No pneumothorax or large pleural effusion identified. IMPRESSION: No evidence of acute cardiopulmonary disease. Improved bibasilar  atelectasis/scarring. Electronically Signed   By: Margarette Canada M.D.   On: 09/30/2021 09:59   DG C-Arm 1-60 Min  Result Date: 09/30/2021 CLINICAL DATA:  Intraoperative dialysis catheter insertion. EXAM: DG C-ARM 1-60 MIN COMPARISON:  Chest XR, 09/25/2021 and 08/28/2021. FLUOROSCOPY: Exposure Index (as provided by the fluoroscopic device): 0.5 mGy Kerma FINDINGS: Single, limited planar image of the RIGHT chest obtained by C-arm. Image demonstrating endotracheal tube, with tip within the distal thoracic trachea and newly placed RIGHT IJ  dialysis catheter, with tip at the proximal RIGHT atrium. IMPRESSION: Fluoroscopic imaging for intraoperative tunneled dialysis catheter placement. The tip of the catheter is positioned within the proximal RIGHT atrium. For complete description of intra procedural findings, please see performing service dictation. Electronically Signed   By: Michaelle Birks M.D.   On: 09/30/2021 11:33        Scheduled Meds:  Chlorhexidine Gluconate Cloth  6 each Topical Q0600   [START ON 10/02/2021] heparin injection (subcutaneous)  5,000 Units Subcutaneous Q8H   sevelamer carbonate  2.4 g Oral TID WC   vancomycin variable dose per unstable renal function (pharmacist dosing)   Does not apply See admin instructions   Continuous Infusions:  meropenem (MERREM) IV 500 mg (10/01/21 0100)     LOS: 4 days    Time spent: 75 minutes spent on chart review, discussion with nursing staff, consultants, updating family and interview/physical exam; more than 50% of that time was spent in counseling and/or coordination of care.    Geradine Girt, DO [facility] Available via Epic secure chat 7am-7pm After these hours, please refer to coverage provider listed on [email or web address] 10/01/2021, 1:01 PM

## 2021-10-01 NOTE — Evaluation (Incomplete)
Physical Therapy Evaluation Patient Details Name: Felicia Williams MRN: [id number] DOB: [birth year] Today's Date: 10/01/2021  History of Present Illness  Pt is a 81 y.o. F who presents 10/01/2021 with ESRD on [place], abdominal pain, and peritonitis. Pt PMH includes HTN, HLD, osteoporosis, and spinal stenosis. Pt was motivated to get moving after receiving dialysis - 09/30/2021 - and returned at midnight last night. Pt was moving independently, and reported weakness in LEs. Pt reported no pain in abdominal area during session, but did reported minimal pain in lower back towards the end of the walk. Pt goals include going home and overcoming peritonitis.  Clinical Impression  Subjective: Pt is a 81 y.o. F who presents 10/01/2021 with ESRD on [place], abdominal pain, and peritonitis. Pt PMH includes HTN, HLD, osteoporosis, and spinal stenosis. Pt was highly motivated to get moving after receiving dialysis - 09/30/2021 - and returned at midnight last night. Pt was moving independently, and reported weakness in LEs. Pt reported no pain in abdominal area during session, but did reported minimal pain in lower back towards the end of the walk. Pt reported a history of one fall, but the date was not gathered. Pt is living with her granddaughter who is able to help with ADLs when needed such as going down to the basement to access the washer and dryer. Pt goals include going home and overcoming peritonitis.    Objective: Pt was able to sit up with the [facility] flat and sit upright with no assistance. Pt was able to independently sit to stand. Pt was able to ambulate at least 164ft while talking with PT, PT was supervising and gait belt was not necessary. The walk was ended due to fatigue in LEs, and Pt began to experience LBP towards the end of walk. Pt sat back down in the recliner independently.      Recommendations for follow up therapy are one component of a multi-disciplinary discharge planning process, led by the attending  physician.  Recommendations may be updated based on patient status, additional functional criteria and insurance authorization.  Follow Up Recommendations:   To better assess the strength of the Pts LE strength and endurance, a formal MMT would need to be performed, but this was not seen as necessary for evaluation.     Assistance Recommended at Discharge    Patient can return home with the following       Equipment Recommendations  Potentially a RW/cane if fatigued. Pt has both ADs at home and is familiar.   Recommendations for Other Services       Functional Status Assessment   Pt was able to move in bed, stand, and walk independently    Precautions / Restrictions Precautions Precautions: None Restrictions Weight Bearing Restrictions: No      Mobility  Bed Mobility Overal bed mobility: Independent Bed Mobility: Supine to Sit     Supine to sit: Independent     General bed mobility comments: Pt laying with HOB elevated upon my arrival    Transfers Overall transfer level: Independent Equipment used: None Transfers: Sit to/from Stand Sit to Stand: Independent           General transfer comment: Pt was able to sit from the EOB and move to stand without any assistance.    Ambulation/Gait Ambulation/Gait assistance: Supervision (Able to walk without a gait belt, but PT was given when Pt was rested from dialysis.)   Assistive device: None  Stairs            Wheelchair Mobility    Modified Rankin (Stroke Patients Only)       Balance Overall balance assessment: Modified Independent                                           Pertinent Vitals/Pain      Home Living Family/patient expects to be discharged to:: Private residence Living Arrangements: Other relatives ([place] daughter) Available Help at Discharge: Family Type of Home: House (One story) Home Access: Stairs to enter Entrance Stairs-Rails: None Entrance  Stairs-Number of Steps: 1   Home Layout: Able to live on main level with bedroom/bathroom;Laundry or work area in basement [employer] daughter helps 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))        Prior Function Prior Level of Function : Independent/Modified Independent;Driving                     Hand Dominance        Extremity/Trunk Assessment        Lower Extremity Assessment Lower Extremity Assessment: Generalized weakness       Communication   Communication: No difficulties  Cognition Arousal/Alertness: Awake/alert Behavior During Therapy: WFL for tasks assessed/performed Overall Cognitive Status: Within Functional Limits for tasks assessed                                          General Comments General comments (skin integrity, edema, etc.): Pt was alert and responsive after waking up from dialysis the previous night. Pt was not in any pain, and was able to move independently (semi-supine to sit, sit to supine, and ambulation). No pain in the abdomkinal area, but began to experience some LBP (not scaled) towards the end of the walk. Osteoporosis and spinal stenosis are thought to be the reason for this pain, but was relieved after sitting in the recliner. Pt is motivated to keep getting better to eventually be home and do dialysis while sleeping.    Exercises     Assessment/Plan    PT Assessment Patient needs continued PT services (Pt demonstrated LE weakness and endurance. Pt would benefit from skilled Pt to improve strength in LEs to increase safety and independence while at home and in the community.)  PT Problem List Decreased strength;Decreased balance;Decreased activity tolerance (Decreased endurance)       PT Treatment Interventions Gait training;Functional mobility training;Therapeutic exercise;Balance training    PT Goals (Current goals can be found in the Care Plan section)  Additional Goals Additional Goal #1: Pt will  tolerate sitting OOB for 5 hours to approximate sitting time for Outpt HD    Frequency       Co-evaluation               AM-PAC PT "6 Clicks" Mobility  Outcome Measure                  End of Session Equipment Utilized During Treatment: Other (comment) (None) Activity Tolerance: Patient limited by fatigue (LBP towards the end of walk. No abdominal pain.) Patient left: in chair;with chair alarm set Nurse Communication: Mobility status PT Visit Diagnosis: Muscle weakness (generalized) (M62.81);Other abnormalities of gait and mobility (R26.89);History of falling (Z91.81)  Time: 2481-8590 PT Time Calculation (min) (ACUTE ONLY): 20 min   Charges:   PT Evaluation $PT Eval Low Complexity: 1 Low          Dusty Wagoner SPT   Ann-Marie Kluge 10/01/2021, 3:55 PM

## 2021-10-02 DIAGNOSIS — D65 Disseminated intravascular coagulation [defibrination syndrome]: Secondary | ICD-10-CM

## 2021-10-02 LAB — CBC
HCT: 25.3 % — ABNORMAL LOW (ref 36.0–46.0)
Hemoglobin: 8.9 g/dL — ABNORMAL LOW (ref 12.0–15.0)
MCH: 35.5 pg — ABNORMAL HIGH (ref 26.0–34.0)
MCHC: 35.2 g/dL (ref 30.0–36.0)
MCV: 100.8 fL — ABNORMAL HIGH (ref 80.0–100.0)
Platelets: 263 10*3/uL (ref 150–400)
RBC: 2.51 MIL/uL — ABNORMAL LOW (ref 3.87–5.11)
RDW: 13.2 % (ref 11.5–15.5)
WBC: 7.5 10*3/uL (ref 4.0–10.5)
nRBC: 0 % (ref 0.0–0.2)

## 2021-10-02 LAB — BODY FLUID CULTURE W GRAM STAIN

## 2021-10-02 MED ORDER — HEPARIN SODIUM (PORCINE) 1000 UNIT/ML IJ SOLN
INTRAMUSCULAR | Status: AC
Start: 2021-10-02 — End: 2021-10-02
  Filled 2021-10-02: qty 1

## 2021-10-02 MED ORDER — RISAQUAD PO CAPS
1.0000 | ORAL_CAPSULE | Freq: Every day | ORAL | Status: DC
  Administered 2021-10-02 – 2021-10-03 (×2): 1 via ORAL
  Filled 2021-10-02 (×2): qty 1

## 2021-10-02 MED ORDER — LOPERAMIDE HCL 2 MG PO CAPS
2.0000 mg | ORAL_CAPSULE | Freq: Once | ORAL | Status: AC
  Administered 2021-10-02: 2 mg via ORAL
  Filled 2021-10-02: qty 1

## 2021-10-02 MED ORDER — ROPINIROLE HCL 0.25 MG PO TABS
0.2500 mg | ORAL_TABLET | Freq: Once | ORAL | Status: AC
  Administered 2021-10-02: 0.25 mg via ORAL
  Filled 2021-10-02 (×2): qty 1

## 2021-10-02 NOTE — Progress Notes (Signed)
PROGRESS NOTE    Felicia Williams  EXB:[id number] DOB: Aug 26, 1940 DOA: 09/27/2021 PCP: [name], NP    Brief Narrative:  Felicia Williams is a 81 y.o. female with medical history significant of ESRD on PD, HTN, GERD who presents with abdominal pain.  She recently had prolonged hospitalization from 2/4 to 2/16 for sepsis secondary to peritonitis.  She had gram-positive cocci on Gram stain but fluid and blood culture were negative.  She was initially treated with cefepime but developed acute metabolic encephalopathy, myoclonic jerks with toxic metabolic encephalopathy seen on LTM EEG.  She was transitioned to 2 weeks of IV meropenem instead per ID.  She completed her course of antibiotics on 2/17. When she left rehab she was starting to have pain in her stomach again.  PD catheter removed and plan is now for HD.  Access placed.  Got first HD 3/11 now needs to be CLIPPED, will also need IV vanc with HD for 2 weeks from 3/11.    Assessment and Plan: Sepsis ([place]) secondary to bacterial peritonitis -Presented with tachycardia and leukocytosis.  Negative CT abdomen and pelvis finding w/hx of ESRD on PD.  -pt had toxic metabolic encephalopathy thought to be due to cefepime and previous hospitalization -Continue IV vancomycin and meropenem- narrow to vanc and plan 2 weeks from 3/11 -obtain blood and peritoneal fluid culture- done 3/9 after IV abx-- some staph epi growing -pain much improved     ESRD on dialysis [facility]) -nephrology consult -PD catheter removed -needs HD -getting clipped, also will get IV abx (vanc) for 2 weeks with HD           DVT prophylaxis: heparin injection 5,000 Units Start: 10/02/21 1000 SCDs Start: 09/28/21 0036    Code Status: Full Code Family Communication: at bedside 3/11  Disposition Plan:  Level of care: Telemetry Medical Status is: Inpatient Remains inpatient appropriate because: needs CLIPPED- ? Home after HD tomorrow?    Consultants:   Vascular nephrology     Subjective: C/o loose stools and restless leg  Objective: Vitals:   10/02/21 1030 10/02/21 1100 10/02/21 1141 10/02/21 1203  BP: (!) 142/73 (!) 156/77 (!) 158/75 (!) 147/71  Pulse: 81 82 83 88  Resp:   (!) 24 17  Temp:   98.1 F (36.7 C) 98.2 F (36.8 C)  TempSrc:   Oral   SpO2:   99% 100%  Weight:   55.7 kg   Height:        Intake/Output Summary (Last 24 hours) at 10/02/2021 1421 Last data filed at 10/02/2021 0800 Gross per 24 hour  Intake 840 ml  Output --  Net 840 ml   Filed Weights   09/30/21 2309 10/02/21 0745 10/02/21 1141  Weight: 57 kg 55.7 kg 55.7 kg    Examination:   General: Appearance:    Well developed, well nourished female in no acute distress     Lungs:     respirations unlabored  Heart:    Normal heart rate. Normal rhythm. No murmurs, rubs, or gallops.    MS:   All extremities are intact.    Neurologic:   Awake, alert, oriented x 3. No apparent focal neurological           defect.          Data Reviewed: I have personally reviewed following labs and imaging studies  CBC: Recent Labs  Lab 09/27/21 2222 09/28/21 0436 09/29/21 0302 09/30/21 0227 10/02/21 0659  WBC 14.0* 12.2* 7.6 6.9 7.5  HGB 12.0 10.4* 9.3* 9.2* 8.9*  HCT 35.6* 31.0* 26.5* 25.9* 25.3*  MCV 107.2* 104.7* 101.5* 99.6 100.8*  PLT 349 272 232 239 941   Basic Metabolic Panel: Recent Labs  Lab 09/27/21 2222 09/28/21 0436 09/29/21 0302 09/30/21 0227 10/01/21 0733  NA 132* 134* 131* 131* 134*  K 4.1 4.6 3.3* 3.3* 3.7  CL 93* 96* 94* 91* 99  CO2 22 21* 23 26 25   GLUCOSE 122* 107* 120* 125* 116*  BUN 52* 53* 52* 49* 20  CREATININE 11.64* 11.46* 11.20* 9.92* 4.55*  CALCIUM 9.3 8.5* 7.9* 7.9* 8.2*  PHOS  --  6.0*  --   --   --    GFR: Estimated Creatinine Clearance: 7.4 mL/min (A) (by C-G formula based on SCr of 4.55 mg/dL (H)). Liver Function Tests: Recent Labs  Lab 09/27/21 2222  AST 29  ALT 25  ALKPHOS 103  BILITOT 0.5  PROT  7.2  ALBUMIN 3.3*   Recent Labs  Lab 09/27/21 2222  LIPASE 83*   No results for input(s): AMMONIA in the last 168 hours. Coagulation Profile: No results for input(s): INR, PROTIME in the last 168 hours. Cardiac Enzymes: No results for input(s): CKTOTAL, CKMB, CKMBINDEX, TROPONINI in the last 168 hours. BNP (last 3 results) No results for input(s): PROBNP in the last 8760 hours. HbA1C: No results for input(s): HGBA1C in the last 72 hours. CBG: No results for input(s): GLUCAP in the last 168 hours. Lipid Profile: No results for input(s): CHOL, HDL, LDLCALC, TRIG, CHOLHDL, LDLDIRECT in the last 72 hours. Thyroid Function Tests: No results for input(s): TSH, T4TOTAL, FREET4, T3FREE, THYROIDAB in the last 72 hours. Anemia Panel: No results for input(s): VITAMINB12, FOLATE, FERRITIN, TIBC, IRON, RETICCTPCT in the last 72 hours. Sepsis Labs: No results for input(s): PROCALCITON, LATICACIDVEN in the last 168 hours.  Recent Results (from the past 240 hour(s))  Resp Panel by RT-PCR (Flu A&B, Covid) Nasopharyngeal Swab     Status: None   Collection Time: 09/27/21 11:10 PM   Specimen: Nasopharyngeal Swab; Nasopharyngeal(NP) swabs in vial transport medium  Result Value Ref Range Status   SARS Coronavirus 2 by RT PCR NEGATIVE NEGATIVE Final    Comment: (NOTE) SARS-CoV-2 target nucleic acids are NOT DETECTED.  The SARS-CoV-2 RNA is generally detectable in upper respiratory specimens during the acute phase of infection. The lowest concentration of SARS-CoV-2 viral copies this assay can detect is 138 copies/mL. A negative result does not preclude SARS-Cov-2 infection and should not be used as the sole basis for treatment or other patient management decisions. A negative result may occur with  improper specimen collection/handling, submission of specimen other than nasopharyngeal swab, presence of viral mutation(s) within the areas targeted by this assay, and inadequate number of  viral copies(<138 copies/mL). A negative result must be combined with clinical observations, patient history, and epidemiological information. The expected result is Negative.  Fact Sheet for Patients:  EntrepreneurPulse.com.au  Fact Sheet for Healthcare Providers:  IncredibleEmployment.be  This test is no t yet approved or cleared by the [place] FDA and  has been authorized for detection and/or diagnosis of SARS-CoV-2 by FDA under an Emergency Use Authorization (EUA). This EUA will remain  in effect (meaning this test can be used) for the duration of the COVID-19 declaration under Section 564(b)(1) of the Act, 21 U.S.C.section 360bbb-3(b)(1), unless the authorization is terminated  or revoked sooner.       Influenza A by PCR NEGATIVE NEGATIVE Final   Influenza B  by PCR NEGATIVE NEGATIVE Final    Comment: (NOTE) The Xpert Xpress SARS-CoV-2/FLU/RSV plus assay is intended as an aid in the diagnosis of influenza from Nasopharyngeal swab specimens and should not be used as a sole basis for treatment. Nasal washings and aspirates are unacceptable for Xpert Xpress SARS-CoV-2/FLU/RSV testing.  Fact Sheet for Patients: EntrepreneurPulse.com.au  Fact Sheet for Healthcare Providers: IncredibleEmployment.be  This test is not yet approved or cleared by the [place] FDA and has been authorized for detection and/or diagnosis of SARS-CoV-2 by FDA under an Emergency Use Authorization (EUA). This EUA will remain in effect (meaning this test can be used) for the duration of the COVID-19 declaration under Section 564(b)(1) of the Act, 21 U.S.C. section 360bbb-3(b)(1), unless the authorization is terminated or revoked.  Performed at [facility], [place] [address], [place], [place] [address]   Culture, blood (routine x 2)     Status: None (Preliminary result)   Collection Time: 09/28/21 12:52 AM    Specimen: BLOOD RIGHT ARM  Result Value Ref Range Status   Specimen Description BLOOD RIGHT ARM  Final   Special Requests   Final    BOTTLES DRAWN AEROBIC AND ANAEROBIC Blood Culture results may not be optimal due to an excessive volume of blood received in culture bottles   Culture   Final    NO GROWTH 4 DAYS Performed at [facility], [place] [address] [place], [place] [address]    Report Status PENDING  Incomplete  Culture, blood (routine x 2)     Status: None (Preliminary result)   Collection Time: 09/28/21 12:57 AM   Specimen: BLOOD LEFT ARM  Result Value Ref Range Status   Specimen Description BLOOD LEFT ARM  Final   Special Requests   Final    BOTTLES DRAWN AEROBIC AND ANAEROBIC Blood Culture results may not be optimal due to an excessive volume of blood received in culture bottles   Culture   Final    NO GROWTH 4 DAYS Performed at [facility], [place] [address], [place], [place] [address]    Report Status PENDING  Incomplete  Body fluid culture w Gram Stain     Status: None   Collection Time: 09/28/21 10:50 AM   Specimen: Peritoneal Dialysate; Body Fluid  Result Value Ref Range Status   Specimen Description PERITONEAL DIALYSATE  Final   Special Requests NONE  Final   Gram Stain   Final    ABUNDANT WBC PRESENT,BOTH PMN AND MONONUCLEAR RARE GRAM POSITIVE COCCI OBSERVED INTRACELLULARLY AND EXTRACELLULARLY Performed at [facility], [place] [address] [place], [place] [address]    Culture FEW STAPHYLOCOCCUS EPIDERMIDIS  Final   Report Status 10/02/2021 FINAL  Final   Organism ID, Bacteria STAPHYLOCOCCUS EPIDERMIDIS  Final      Susceptibility   Staphylococcus epidermidis - MIC*    CIPROFLOXACIN <=0.5 SENSITIVE Sensitive     ERYTHROMYCIN >=8 RESISTANT Resistant     GENTAMICIN <=0.5 SENSITIVE Sensitive     OXACILLIN >=4 RESISTANT Resistant     TETRACYCLINE 2 SENSITIVE Sensitive     VANCOMYCIN 2 SENSITIVE Sensitive     TRIMETH/SULFA 160 RESISTANT Resistant      CLINDAMYCIN >=8 RESISTANT Resistant     RIFAMPIN <=0.5 SENSITIVE Sensitive     Inducible Clindamycin NEGATIVE Sensitive     * FEW STAPHYLOCOCCUS EPIDERMIDIS         Radiology Studies: No results found.      Scheduled Meds:  acidophilus  1 capsule Oral Daily   Chlorhexidine Gluconate Cloth  6 each Topical Q0600   heparin injection (subcutaneous)  5,000 Units Subcutaneous Q8H   heparin sodium (porcine)       sevelamer carbonate  2.4 g Oral TID WC   vancomycin variable dose per unstable renal function (pharmacist dosing)   Does not apply See admin instructions   Continuous Infusions:     LOS: 5 days    Time spent: 75 minutes spent on chart review, discussion with nursing staff, consultants, updating family and interview/physical exam; more than 50% of that time was spent in counseling and/or coordination of care.    Geradine Girt, DO [facility] Available via Epic secure chat 7am-7pm After these hours, please refer to coverage provider listed on [email or web address] 10/02/2021, 2:21 PM

## 2021-10-02 NOTE — Progress Notes (Signed)
Requested to see pt for out-pt HD needs at d/c. Pt was receiving PD through [facility] prior to admission. Met with pt at bedside. Pt prefers to stay with [facility] for HD at d/c. Pt prefers MWF 2nd shift if possible. Contacted [facility] and spoke to [place]. Clinic advised pt will need HD at d/c and clinic currently does not have any MWF available. Pt provided option of TTS 6:30 or 11:30. Pt prefers 6:30. Spoke to Joy at clinic again to make per aware that pt wants to accept TTS 6:30 chair. Clinic can start pt on Thursday. Clinicals faxed to [facility] this afternoon per their request for continuation of care . Update provided to nephrologist regarding the above. Will assist as needed.  ? ?[name] ?Renal Navigator ?[phone] ?

## 2021-10-02 NOTE — Progress Notes (Signed)
Physical Therapy Treatment Patient Details Name: Felicia Williams MRN: [id number] DOB: 1941/02/05 Today's Date: 10/02/2021   History of Present Illness Pt is a 81 y.o. F who presents 10/01/2021 with ESRD on PD, abdominal pain, and peritonitis; PD catheter removed on 09/30/2021 (Plan is to transition to HD for renal replacement therapy). Pt PMH includes HTN, HLD, osteoporosis, and spinal stenosis.    PT Comments    Normal vitals with orthostatic BP measurements (values are below). Pt was able to move independently sit<>stand. Pt performed a 10MWT with a score of .48 m/s (1.42 ft/s) which is indicative of a limited community ambulator; while using a RW to control for fatigue. Activity was limited due to fatigue in LEs and LBP. Pt reported pain as uncomfortable towards the end of the session, and was left in the recliner. Pt stated a new goal of wanting to be able to work in her yard when DC, and wanted to defer PT to her spine doctor so they can discuss plan for PT going forward. Pt is motivated to continue moving post DC to remain independent. Pt still benefits from skilled PT to increase safety and independence with mobility and ADLs in preparation for DC.    Recommendations for follow up therapy are one component of a multi-disciplinary discharge planning process, led by the attending physician.  Recommendations may be updated based on patient status, additional functional criteria and insurance authorization.  Follow Up Recommendations  Other (comment) (Pt wants decision for PT to be deferred to spine doctor until Pt and doctor can set up appointment to restart PT POC with PT post DC.     Assistance Recommended at Discharge PRN  Patient can return home with the following Assist for transportation   Equipment Recommendations  None     Recommendations for Other Services None      Precautions / Restrictions Precautions Precautions: None Restrictions Weight Bearing Restrictions: No      Mobility  Bed Mobility                    Transfers Overall transfer level: Independent     Sit to Stand: Independent (Used hands to help stand.)           General transfer comment: RW was available to use for upcoming gait velocity exam test, but did not use when standing up, or sitting down afterwards.    Ambulation/Gait Ambulation/Gait assistance: Supervision Gait Distance (Feet): 90 Feet Assistive device: Rolling walker (2 wheels) (Used for safety during ambulation due to fatigue.) Gait Pattern/deviations: Step-through pattern Gait velocity: Decreased Gait velocity interpretation: 1.31 - 2.62 ft/sec, indicative of limited community ambulator (10MWT = . 48 m/s) Pre-gait activities: Orthostatic BP measurements General Gait Details: Cues to self-monitor for activitytolerance   Stairs             Wheelchair Mobility    Modified Rankin (Stroke Patients Only)       Balance                                            Cognition Arousal/Alertness: Awake/alert Behavior During Therapy: WFL for tasks assessed/performed Overall Cognitive Status: Within Functional Limits for tasks assessed                     Current Attention Level:    Following Commands: Follows multi-step commands consistently  Awareness: Emergent Problem Solving: Slow processing, Requires verbal cues General Comments: Pt awake sitting in recliner. Was able to follow instructions diligently for orthostatic BP measurements and for gait velocity examination.        Exercises      General Comments General comments (skin integrity, edema, etc.): Pt reported pain at incision from operation 10/02/2021.      Pertinent Vitals/Pain Pain Assessment Pain Assessment: Faces Faces Pain Scale: Hurts little more Pain Location: Low back pain Pain Descriptors / Indicators: Sore, Discomfort Pain Intervention(s): Limited activity within patient's tolerance     Home Living                          Prior Function            PT Goals (current goals can now be found in the care plan section) Acute Rehab PT Goals Patient Stated Goal: Pt goals include being able to work in her yard after DC as a long-term goal and wants to keep working on her independence. PT Goal Formulation: With patient Time For Goal Achievement: 10/15/21 Potential to Achieve Goals: Good Progress towards PT goals: Progressing toward goals (Obtained more outcome measures to track progress, but fatigue from dialysis is limiting activity tolerance.)    Frequency    Min 3X/week      PT Plan Discharge plan needs to be updated (Pt wants decision for PT to be deferred to spine doctor until Pt and doctor can set up appointment to restart PT POC with PT post DC.)    Co-evaluation              AM-PAC PT "6 Clicks" Mobility   Outcome Measure  Help needed turning from your back to your side while in a flat bed without using bedrails?: None Help needed moving from lying on your back to sitting on the side of a flat bed without using bedrails?: None Help needed moving to and from a bed to a chair (including a wheelchair)?: None Help needed standing up from a chair using your arms (e.g., wheelchair or bedside chair)?: None Help needed to walk in hospital room?: None Help needed climbing 3-5 steps with a railing? : None 6 Click Score: 24    End of Session Equipment Utilized During Treatment: Other (comment) (RW) Activity Tolerance: Patient limited by fatigue;Patient limited by pain Patient left: in chair;with call bell/phone within reach   PT Visit Diagnosis: Muscle weakness (generalized) (M62.81);Other abnormalities of gait and mobility (R26.89) Pain - part of body:  (Lower back)     Time: 2751-7001 PT Time Calculation (min) (ACUTE ONLY): 32 min  Charges:  $Gait Training: 8-22 mins $Therapeutic Activity: 8-22 mins                     Artis Flock  SPT  Acute Rehab Services Office: [phone]   Artis Flock 10/02/2021, 5:18 PM

## 2021-10-02 NOTE — Progress Notes (Signed)
[facility] ?Progress Note ? ? OP PD: 5 exchanges overnight, 2.5 L vol, usually hangs 2- 6L 2.5% bags and 1 - 3 L 1.5% bag. Does last fill w/ icodextrin for daybag.  Dry wt 57kg. ? - Aug 27, 2021 >> hep B SAg neg, Ab's < 3.1 = not protective ?  ? ?Assessment/ Plan: ?Abdominal pain/ recurrent PD catheter related peritonitis - hx of recent culture neg peritonitis in Feb 2023 treated w/ 14 days of IV abx. Pt returned now w/ recurrent abd pain. PD fluid showed 10,290 cell ct w/ 93% segs consistent w/ recurrent peritonitis. Pain and exam were worsening 3/20 and [facility]VS was consulted for PD cath removal which was done 1/60 w/o complications. Pain is better. Cx's are growing staph epi, IV abx to be adjusted per pmd. TDC was placed as well 3/11 and pt had HD Sat. ?- S/p HD this AM as well which she tolerated and now transitioned to HD. ?- Appreciate VVS assistance.   ? ?- Started CLIP process -> renal navigator contacted. VVS holding off on AV graft until patient recovers from this episode.  ? ?Anticipate d/c probably in 1-2 days after we can get her a spot at a [facility] HD unit.  ?HTN/ vol - BP's wnl, euvolemic on exam. Is at dry wt.  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 Phos 6, stable.   ?  ? ?Subjective: much more alert and still has abd pain but somewhat better. Cx + for staph epi. Denies f/c/n/v/dyspnea.  ? ?Vitals:  ? 10/02/21 1000 10/02/21 1030 10/02/21 1100 10/02/21 1141  ?BP: (!) 169/78 (!) 142/73 (!) 156/77 (!) 158/75  ?Pulse: 74 81 82 83  ?Resp:    (!) 24  ?Temp:    98.1 ?F (36.7 ?C)  ?TempSrc:    Oral  ?SpO2:    99%  ?Weight:    55.7 kg  ?Height:      ? ? ?Exam: ?GEN: NAD, A&Ox3, NCAT ?HEENT: No conjunctival pallor, EOMI ?NECK: Supple, no thyromegaly ?LUNGS: CTA B/L no rales, rhonchi or wheezing ?CV: RRR, No M/R/G ?ABD: SNDNT +BS  ?EXT: No lower extremity edema ?ACCESS: RIJ TC ? ?Home meds include - norco prn, renvela 3 ac tid, rena vit, trazodone, pepcid ?  ?   ? ? ? ?Recent Labs  ?Lab 09/27/21 ?2222 09/28/21 ?1093 09/29/21 ?0302 09/30/21 ?2355 10/01/21 ?7322 10/02/21 ?0254  ?HGB 12.0 10.4*   < > 9.2*  --  8.9*  ?ALBUMIN 3.3*  --   --   --   --   --   ?CALCIUM 9.3 8.5*   < > 7.9* 8.2*  --   ?PHOS  --  6.0*  --   --   --   --   ?CREATININE 11.64* 11.46*   < > 9.92* 4.55*  --   ?K 4.1 4.6   < > 3.3* 3.7  --   ? < > = values in this interval not displayed.  ? ?Inpatient medications: ? Chlorhexidine Gluconate Cloth  6 each Topical Q0600  ? heparin injection (subcutaneous)  5,000 Units Subcutaneous Q8H  ? heparin sodium (porcine)      ? sevelamer carbonate  2.4 g Oral TID WC  ? vancomycin variable dose per unstable renal function (pharmacist dosing)   Does not apply See admin instructions  ? ? ? ?acetaminophen, fentaNYL (SUBLIMAZE) injection **OR** fentaNYL (SUBLIMAZE) injection, HYDROcodone-acetaminophen, hydrOXYzine, prochlorperazine, traZODone ? ? ? ? ? ? ?

## 2021-10-03 LAB — CULTURE, BLOOD (ROUTINE X 2)
Culture: NO GROWTH
Culture: NO GROWTH

## 2021-10-03 LAB — HEPATITIS B SURFACE ANTIBODY, QUANTITATIVE: Hep B S AB Quant (Post): <3.1 m[IU]/mL — ABNORMAL LOW (ref >9.9)

## 2021-10-03 MED ORDER — VANCOMYCIN HCL 500 MG/100ML IV SOLN: 500.0000 mg | INTRAVENOUS | Status: DC

## 2021-10-03 MED ORDER — RISAQUAD PO CAPS: 1.0000 | ORAL_CAPSULE | Freq: Every day | ORAL | 0 refills | Status: AC

## 2021-10-03 NOTE — Progress Notes (Signed)
Subjective: Seen in room no complaints,  tolerated dialysis yesterday, no shortness of breath ,no nausea vomiting no chest pain has been clipped for TTS at [facility] ? ?Objective ?Vital signs in last 24 hours: ?Vitals:  ? 10/02/21 1732 10/02/21 2101 10/03/21 0530 10/03/21 0903  ?BP: (!) 145/68 (!) 150/61 (!) 147/68 (!) 151/65  ?Pulse: 85 83 81 81  ?Resp: 17 18 18 17   ?Temp: 98.2 ?F (36.8 ?C) 99.1 ?F (37.3 ?C) 98.7 ?F (37.1 ?C) 98 ?F (36.7 ?C)  ?TempSrc:  Oral  Oral  ?SpO2: 97% 96% 97% 97%  ?Weight:      ?Height:      ? ?Weight change:  ? ?Physical Exam: ?General: Alert elderly female NAD ?Heart: RRR no MRG ?Lungs: CTA bilaterally nonlabored breathing ?Abdomen: NABS, soft NTND abdominal wall dressings dry and clean ?Extremities: No pedal edema  ?Dialysis Access: Right IJ TC dressing dry clear ? ? ?OP PD: 5 exchanges overnight, 2.5 L vol, usually hangs 2- 6L 2.5% bags and 1 - 3 L 1.5% bag. Does last fill w/ icodextrin for daybag.  Dry wt 57kg. ? - Aug 27, 2021 >> hep B SAg neg, Ab's < 3.1 = not protective ? ?Problem/Plan: ?Recurrent PD catheter re lated peritonitis= some staph epi growing,/311 PD catheter removed, PermCath placed as well 3/11 given IV vancomycin and meropenem/ 2 weeks total Vanco from 3/11 per PMD, ?ESRD -prior PD as above now on HD, has been clipped a TTS [name], K3.7 BUN 20 creatinine 4.55, volume labs stable for next dialysis Thursday at outpatient HD ?HTN/volume - a.m. BP 147/68 no excess volume O2 sat 97 room air ?Anemia -Hgb 8.9 to start ESA as outpatient ?Secondary hyperparathyroidism -takes Renvela powder as binder calcium phosphorus stable ? ?Ernest Haber, PA-C ?[facility] ?Beeper [phone] ?10/03/2021,11:20 AM ? LOS: 6 days  ? ?Labs: ?Basic Metabolic Panel: ?Recent Labs  ?Lab 09/28/21 ?0436 09/29/21 ?0302 09/30/21 ?0227 10/01/21 ?9678  ?NA 134* 131* 131* 134*  ?K 4.6 3.3* 3.3* 3.7  ?CL 96* 94* 91* 99  ?CO2 21* 23 26 25   ?GLUCOSE 107* 120* 125* 116*  ?BUN 53* 52* 49* 20   ?CREATININE 11.46* 11.20* 9.92* 4.55*  ?CALCIUM 8.5* 7.9* 7.9* 8.2*  ?PHOS 6.0*  --   --   --   ? ?Liver Function Tests: ?Recent Labs  ?Lab 09/27/21 ?2222  ?AST 29  ?ALT 25  ?ALKPHOS 103  ?BILITOT 0.5  ?PROT 7.2  ?ALBUMIN 3.3*  ? ?Recent Labs  ?Lab 09/27/21 ?2222  ?LIPASE 83*  ? ?No results for input(s): AMMONIA in the last 168 hours. ?CBC: ?Recent Labs  ?Lab 09/27/21 ?9381 09/28/21 ?0436 09/29/21 ?0302 09/30/21 ?0175 10/02/21 ?1025  ?WBC 14.0* 12.2* 7.6 6.9 7.5  ?HGB 12.0 10.4* 9.3* 9.2* 8.9*  ?HCT 35.6* 31.0* 26.5* 25.9* 25.3*  ?MCV 107.2* 104.7* 101.5* 99.6 100.8*  ?PLT 349 272 232 239 263  ? ?Cardiac Enzymes: ?No results for input(s): CKTOTAL, CKMB, CKMBINDEX, TROPONINI in the last 168 hours. ?CBG: ?No results for input(s): GLUCAP in the last 168 hours. ? ?Studies/Results: ?No results found. ?Medications: ? [START ON 10/05/2021] vancomycin    ? ? acidophilus  1 capsule Oral Daily  ? Chlorhexidine Gluconate Cloth  6 each Topical Q0600  ? heparin injection (subcutaneous)  5,000 Units Subcutaneous Q8H  ? sevelamer carbonate  2.4 g Oral TID WC  ? vancomycin variable dose per unstable renal function (pharmacist dosing)   Does not apply See admin instructions  ? ? ? ? ?

## 2021-10-03 NOTE — Progress Notes (Signed)
Round with patient today in correlation to transition from peritoneal dialysis to hemodialysis. Patient has full understanding of effects of hyperkalemia, hyperphosphatemia and fluid overload as she was previously on hemodialysis prior to start of peritoneal dialysis. Handouts provided on dietary restrictions for her review. Patient with long term questions related to access. Ie. Will she be able to have PD catheter replaced in the future or will she need an AV graft. Patient directed to have a conversation with both the nephrologist and vascular services. Patient also with questions for start of outpatint dialysis treatment starting schedule. Deferred to renal navigator for follow-up. Patient with possible plan for D/C today. Will follow as appropriate.  ? ?Dorthey Sawyer, RN  ?Dialysis Nurse Coordinator ?[phone] ? ? ?

## 2021-10-03 NOTE — Discharge Summary (Signed)
?Physician Discharge Summary ?  ?Patient: Felicia Williams MRN: [id number] DOB: 05-08-1941  ?Admit date:     09/27/2021  ?Discharge date: 10/03/21  ?Discharge Physician: Geradine Girt  ? ?PCP: [name], NP  ? ?Recommendations at discharge:  ? ?Vanc x 2 weeks from 3/11 ? ?Discharge Diagnoses: ?Active Problems: ?  Sepsis ([place]) ?  ESRD on dialysis [facility]) ? ? ? ?Hospital Course: ?Felicia Williams is a 81 y.o. female with medical history significant of ESRD on PD, HTN, GERD who presents with abdominal pain.  She recently had prolonged hospitalization from 2/4 to 2/16 for sepsis secondary to peritonitis.  She had gram-positive cocci on Gram stain but fluid and blood culture were negative.  She was initially treated with cefepime but developed acute metabolic encephalopathy, myoclonic jerks with toxic metabolic encephalopathy seen on LTM EEG.  She was transitioned to 2 weeks of IV meropenem instead per ID.  She completed her course of antibiotics on 2/17. ?When she left rehab she was starting to have pain in her stomach again.  PD catheter removed and plan is now for HD.  Access placed.  Got first HD 3/11 now needs to be CLIPPED, will also need IV vanc with HD for 2 weeks from 3/11. ? ?Assessment and Plan: ?Sepsis ([place]) ?secondary to bacterial peritonitis ?-Presented with tachycardia and leukocytosis.  Negative CT abdomen and pelvis finding w/hx of ESRD on PD.  ?-pt had toxic metabolic encephalopathy thought to be due to cefepime and previous hospitalization ?-Continue IV vancomycin and meropenem- narrow to vanc and plan 2 weeks from 3/11 ?-obtain blood and peritoneal fluid culture- done 3/9 after IV abx-- some staph epi growing ?-pain much improved ? ? ? ? ?ESRD on dialysis [facility]) ?-nephrology consult ?-PD catheter removed ?-needs HD ?-getting clipped, also will get IV abx (vanc) for 2 weeks with HD ? ? ? ? ? ? ? ?Consultants: renal ? ?Disposition: Home ?Diet recommendation:  ? ?DISCHARGE MEDICATION: ?Allergies as  of 10/03/2021   ? ?   Reactions  ? Cefepime Other (See Comments)  ? Feb 2023 Encephalopathy with questionable seizures. Seems to be tolerating cefazolin   ? Morphine   ? "Dry heaving like crazy"  ? Sulfa Antibiotics Other (See Comments)  ? Shut pt's kidneys down  ? ?  ? ?  ?Medication List  ?  ? ?STOP taking these medications   ? ?famotidine 20 MG tablet ?Commonly known as: Pepcid ?  ?gentamicin cream 0.1 % ?Commonly known as: GARAMYCIN ?  ? ?  ? ?TAKE these medications   ? ?acetaminophen 325 MG tablet ?Commonly known as: TYLENOL ?Take 2 tablets (650 mg total) by mouth every 6 (six) hours as needed for mild pain. ?  ?acidophilus Caps capsule ?Take 1 capsule by mouth daily for 12 days. ?Start taking on: October 04, 2021 ?  ?camphor-menthol lotion ?Commonly known as: SARNA ?Apply topically as needed for itching. ?  ?HYDROcodone-acetaminophen 10-325 MG tablet ?Commonly known as: NORCO ?Take 0.5 tablets by mouth 2 (two) times daily as needed for moderate pain. ?  ?hydrOXYzine 10 MG tablet ?Commonly known as: ATARAX ?Take 1 tablet (10 mg total) by mouth 3 (three) times daily as needed for itching. ?  ?loperamide 2 MG capsule ?Commonly known as: IMODIUM ?Take 1 capsule (2 mg total) by mouth as needed for diarrhea or loose stools. ?  ?multivitamin Tabs tablet ?Take 1 tablet by mouth at bedtime. ?  ?sevelamer carbonate 2.4 g Pack ?Commonly known as: RENVELA ?Take 2.4 g  by mouth 3 (three) times daily with meals. ?  ?traZODone 50 MG tablet ?Commonly known as: DESYREL ?Take 0.5-1 tablets (25-50 mg total) by mouth at bedtime as needed for sleep. ?  ?vancomycin 500 MG/100ML IVPB ?Commonly known as: VANCOREADY ?Inject 100 mLs (500 mg total) into the vein Every Tuesday,Thursday,and Saturday with dialysis. ?Start taking on: October 05, 2021 ?  ? ?  ? ? Follow-up Information   ? ? [place], Lennon on 10/05/2021.   ?Why: Schedule is Tuesday/Thursday/Saturday with 6:30 chair time.  ?Patient needs to arrive at  6:15 for appointment. ?Contact information: ?[address] ?[place] [address] ?[phone] ? ? ?  ?  ? ?  ?  ? ?  ? ?Discharge Exam: ?Filed Weights  ? 09/30/21 2309 10/02/21 0745 10/02/21 1141  ?Weight: 57 kg 55.7 kg 55.7 kg  ? ? ?Condition at discharge: good ? ?The results of significant diagnostics from this hospitalization (including imaging, microbiology, ancillary and laboratory) are listed below for reference.  ? ?Imaging Studies: ?CT ABDOMEN PELVIS WO CONTRAST ? ?Result Date: 09/27/2021 ?CLINICAL DATA:  Lower abdominal pain for several hours, initial encounter EXAM: CT ABDOMEN AND PELVIS WITHOUT CONTRAST TECHNIQUE: Multidetector CT imaging of the abdomen and pelvis was performed following the standard protocol without IV contrast. RADIATION DOSE REDUCTION: This exam was performed according to the departmental dose-optimization program which includes automated exposure control, adjustment of the mA and/or kV according to patient size and/or use of iterative reconstruction technique. COMPARISON:  08/26/2021, MRI from 12/09/2020 FINDINGS: Lower chest: Lung bases are clear. Previously seen nodule is not included on this exam. Hepatobiliary: Liver shows no focal mass lesion. Gallbladder is within normal limits. Mild ascites is noted adjacent to the liver. Pancreas: Pancreas is well visualized. Stable cyst is noted in the midportion of the body similar to that seen on prior MRI examination. Spleen: Normal in size without focal abnormality. Adrenals/Urinary Tract: Adrenal glands are within normal limits. Kidneys are well visualized and atrophic stable in appearance from the prior exam. No obstructive changes are seen. The bladder is decompressed. Peritoneal dialysis catheter is noted low in the left pelvis. Free fluid is noted consistent with retained dialysate. Stomach/Bowel: Stomach is well visualized and within normal limits. No obstructive or inflammatory changes of large or small bowel are seen. The appendix  has been surgically removed. Vascular/Lymphatic: Aortic atherosclerosis. No enlarged abdominal or pelvic lymph nodes. Reproductive: Uterus and bilateral adnexa are unremarkable. Other: Peritoneal dialysis catheter and retained dialysate are noted as described above. Musculoskeletal: Degenerative changes of the lumbar spine are noted. IMPRESSION: Peritoneal dialysis catheter with retained dialysate. Stable cyst in the midportion of the pancreatic body unchanged from prior MRI. Follow-up as described on prior MRI. No acute abnormality is noted. Electronically Signed   By: Inez Catalina M.D.   On: 09/27/2021 23:09  ? ?DG CHEST PORT 1 VIEW ? ?Result Date: 09/30/2021 ?CLINICAL DATA:  End-stage renal disease.  Shortness of breath. EXAM: PORTABLE CHEST 1 VIEW COMPARISON:  08/28/2021 and prior radiographs FINDINGS: The cardiomediastinal silhouette is unremarkable. A RIGHT IJ central venous catheter is noted with tip overlying the SUPERIOR cavoatrial junction. Mild bibasilar atelectasis/scarring is improved. No pneumothorax or large pleural effusion identified. IMPRESSION: No evidence of acute cardiopulmonary disease. Improved bibasilar atelectasis/scarring. Electronically Signed   By: Margarette Canada M.D.   On: 09/30/2021 09:59  ? ?DG C-Arm 1-60 Min ? ?Result Date: 09/30/2021 ?CLINICAL DATA:  Intraoperative dialysis catheter insertion. EXAM: DG C-ARM 1-60  MIN COMPARISON:  Chest XR, 09/25/2021 and 08/28/2021. FLUOROSCOPY: Exposure Index (as provided by the fluoroscopic device): 0.5 mGy Kerma FINDINGS: Single, limited planar image of the RIGHT chest obtained by C-arm. Image demonstrating endotracheal tube, with tip within the distal thoracic trachea and newly placed RIGHT IJ dialysis catheter, with tip at the proximal RIGHT atrium. IMPRESSION: Fluoroscopic imaging for intraoperative tunneled dialysis catheter placement. The tip of the catheter is positioned within the proximal RIGHT atrium. For complete description of intra  procedural findings, please see performing service dictation. Electronically Signed   By: Michaelle Birks M.D.   On: 09/30/2021 11:33   ? ?Microbiology: ?Results for orders placed or performed during the hospital en

## 2021-10-03 NOTE — Progress Notes (Signed)
Pt to d/c to home today. Met with pt at bedside to discuss pt's out-pt HD schedule at [facility]. Pt has a schedule of TTS with 6:15 arrival with 6:30 chair time. Pt provided a resource sheet with above info noted. Also added schedule to AVS as well. Pt agreeable to plan. Spoke to [place] at clinic who is aware that pt will start on Thursday and that PA, Shanon Brow, is to call clinic with iv Vanc orders. Will fax d/c summary and last HD order once summary is available for continuation of care.  ? ?[name] ?Renal Navigator ?[phone] ?

## 2021-10-03 NOTE — TOC Transition Note (Signed)
Transition of Care (TOC) - CM/SW Discharge Note ? ? ?Patient Details  ?Name: Felicia Williams ?MRN: [id number] ?Date of Birth: 10-Jun-1941 ? ?Transition of Care (TOC) CM/SW Contact:  ?Tom-Johnson, Renea Ee, RN ?Phone Number: ?10/03/2021, 12:46 PM ? ? ?Clinical Narrative:    ? ?Patient is scheduled for discharge today. Admitted for ESRD and started Hemodialysis as patient was on Peritoneal Dialysis at home. PD catheter removed on 03/11 due to Peritonitis. Patient should start outpatient HD with [facility] facility in [place]. Family to transport. Patient was active with [facility] care for PT/RN/Disease Management disciplines last month.  No PT followup noted. Patient denies any other needs. No further TOC needs noted. ? ?Final next level of care: Home/Self Care ?Barriers to Discharge: Barriers Resolved ? ? ?Patient Goals and CMS Choice ?Patient states their goals for this hospitalization and ongoing recovery are:: To return home ?CMS Medicare.gov Compare Post Acute Care list provided to:: Patient ?Choice offered to / list presented to : NA ? ?Discharge Placement ?  ?           ?  ?Patient to be transferred to facility by: Rozanna Boer daughter ?  ?  ? ?Discharge Plan and Services ?  ?  ?           ?DME Arranged: N/A ?DME Agency: NA ?  ?  ?  ?HH Arranged: NA ?[place] Agency: NA ?  ?  ?  ? ?Social Determinants of Health (SDOH) Interventions ?  ? ? ?Readmission Risk Interventions ?Readmission Risk Prevention Plan 01/20/2019  ?Transportation Screening Complete  ?PCP or Specialist Appt within 3-5 Days Complete  ?[place] or Home Care Consult Complete  ?Social Work Consult for [place] Planning/Counseling Complete  ?Palliative Care Screening Not Applicable  ?Medication Review Press photographer) Complete  ?Some recent data might be hidden  ? ? ? ? ? ?

## 2021-10-03 NOTE — Progress Notes (Signed)
AVS given and reviewed with pt. Medications discussed. All questions answered to satisfaction. Pt verbalized understanding of information given. Pt to be escorted off the unit with all belongings via wheelchair by staff member.  

## 2021-10-03 NOTE — Progress Notes (Signed)
Physical Therapy Treatment ?Patient Details ?Name: Felicia Williams ?MRN: [id number] ?DOB: Sep 27, 1940 ?Today's Date: 10/03/2021 ? ? ?History of Present Illness Pt is a 81 y.o. F who presents 10/01/2021 with ESRD on [place], abdominal pain, and peritonitis. Pt PMH includes HTN, HLD, osteoporosis, and spinal stenosis.  ? ?  ?PT Comments  ? ? Normal response to orthostatic vitals measurements (see values below). Pt was tired due to a lack of sleep the previous night but was motivated to get moving prior to DC. Pt reported LBP was like yesterday's pain. Pt was walking independently in room. During gait examination tests, a rollator was used to decrease work load during walking and help with fatigue. Pt scored .59 m/s (1.76 ft/s) during 10MWT which is indicative of a limited community ambulator, but is .3 ft/s faster than yesterday when the test was administered. Further, a 6MWT was also used to determine muscular endurance for home and community ADLs. Pt was able to walk 641f in 6 minutes, which is indicative of decreased activity tolerance for her age group. In total, the Pt walked 9141f Pt reported no pain during session in her low back, but stated she took 2 Tylenol prior to PT session. Pt is cleared for DC home due to safety and independence with mobility. Pt would still benefit from skilled PT in the outpatient setting to improve BLE strength and increase muscular endurance to maintain independence with activities and ADLs.  ?  ?Recommendations for follow up therapy are one component of a multi-disciplinary discharge planning process, led by the attending physician.  Recommendations may be updated based on patient status, additional functional criteria and insurance authorization. ? ?Follow Up Recommendations ? No PT follow up ?  ?  ?Assistance Recommended at Discharge PRN  ?Patient can return home with the following Assist for transportation ?  ?Equipment Recommendations ? Other (comment) (PT reported having a cane, RW,  and rollator.)  ?  ?Recommendations for Other Services   ? ? ?  ?Precautions / Restrictions Precautions ?Precautions: None ?Restrictions ?Weight Bearing Restrictions: No  ?  ? ?Mobility ? Bed Mobility ?  ?  ?  ?  ?  ?  ?  ?  ?  ? ?Transfers ?Overall transfer level: Independent ?Equipment used: Rolling walker (2 wheels) ?Transfers: Sit to/from Stand ?Sit to Stand: Independent, Modified independent (Device/Increase time) ?  ?  ?  ?  ?  ?General transfer comment: RW was likely used due to fatigue from lack of sleep. ?  ? ?Ambulation/Gait ?Ambulation/Gait assistance: Modified independent (Device/Increase time) ?Gait Distance (Feet): 911 Feet ?Assistive device: Rollator (4 wheels) ?Gait Pattern/deviations: Step-to pattern ?Gait velocity: Increased this session, but still same category for interpretation. ?Gait velocity interpretation: 1.31 - 2.62 ft/sec, indicative of limited community ambulator (1.76 ft/s) ?  ?General Gait Details: Cues to self-monitor for activity tolerance. Stopped when Pt mentioned shortness of breath to measure pulse and SpO2. Pulse = 109bpm, SpO2 = 100%. ? ? ?Stairs ?  ?  ?  ?  ?  ? ? ?Wheelchair Mobility ?  ? ?Modified Rankin (Stroke Patients Only) ?  ? ? ?  ?Balance   ?  ?  ?  ?  ?  ?  ?  ?  ?  ?  ?  ?  ?  ?  ?  ?  ?  ?  ?  ? ?  ?Cognition   ?  ?  ?  ?  ?  ?  ?  ?  ?  ?  ?  ?  ?  ?  ?  ?  ?  ?  ?  ?  ?  ? ?  ?  Exercises   ? ?  ?General Comments   ?  ?  ? ?Pertinent Vitals/Pain Pain Assessment ?Pain Assessment: No/denies pain (No LBP during ambulation, but reported they took 2 tylenol prior to PT.) ?Faces Pain Scale: No hurt  ? ? ?Home Living   ?  ?  ?  ?  ?  ?  ?  ?  ?  ?   ?  ?Prior Function    ?  ?  ?   ? ?PT Goals (current goals can now be found in the care plan section) Acute Rehab PT Goals ?PT Goal Formulation: With patient ?Time For Goal Achievement: 10/03/21 ?Potential to Achieve Goals: Good ?Progress towards PT goals: Goals met and updated - see care plan ? ?  ?Frequency ? ? ?   ? ? ? ?   ?PT Plan    ? ? ?Co-evaluation   ?  ?  ?  ?  ? ?  ?AM-PAC PT "6 Clicks" Mobility   ?Outcome Measure ? Help needed turning from your back to your side while in a flat bed without using bedrails?: None ?Help needed moving from lying on your back to sitting on the side of a flat bed without using bedrails?: None ?Help needed moving to and from a bed to a chair (including a wheelchair)?: None ?Help needed standing up from a chair using your arms (e.g., wheelchair or bedside chair)?: None ?Help needed to walk in hospital room?: None ?Help needed climbing 3-5 steps with a railing? : None ?6 Click Score: 24 ? ?  ?End of Session Equipment Utilized During Treatment: Other (comment) (Rollator) ?Activity Tolerance: Patient limited by fatigue;Patient tolerated treatment well ?Patient left: in chair;with call bell/phone within reach ?  ?PT Visit Diagnosis: Muscle weakness (generalized) (M62.81);Other abnormalities of gait and mobility (R26.89) ?Pain - part of body:  (No pain in low back this PT session.) ?  ? ? ?Time: 2620-3559 ?PT Time Calculation (min) (ACUTE ONLY): 30 min ? ?Charges:  $Gait Training: 23-37 mins          ?          ? ?Artis Flock SPT ?Acute Rehab Services ?Office: [phone] ? ? ?Artis Flock ?10/03/2021, 12:31 PM ? ?

## 2021-10-03 NOTE — Progress Notes (Addendum)
Inpt HD order for treatment on 10/02/21 ordered by Dr Jonnie Finner.  Order requested by out-pt HD unit. Will fax order to clinic per their request for continuation of care.  ?Hemodialysis inpatient (Order [id number]) ?Dialysis ?Date: 10/01/2021 Department: [facility] Ordering/Authorizing: Roney Jaffe, MD  ? ?  ?Roney Jaffe, MD NPI: [id number]  ?  ?Patient Information ? ?Patient Name  ?Felicia Williams Legal Sex  ?Female DOB  ?Jul 22, 1941 SSN  ?[id number]  ? ?Order Information ? ?Order Date/Time Release Date/Time Start Date/Time End Date/Time  ?10/01/21 02:40 PM None 10/02/21 12:00 AM 10/02/21 12:00 AM  ? ?Order History ?Inpatient ?Date/Time Action Taken User Additional Information  ?10/01/21 1440 Sign Roney Jaffe, MD   ?10/01/21 1440 Release Instance Roney Jaffe, MD (auto-released) Released [id number]  ?10/01/21 [place], Allyson P, RN New Order  ? ?Order Questions ? ?Question Answer  ?K+ 4 meq  ?Ca++ 2.25 meq  ?Na+ 137 meq  ?Na+ Modeling none  ?Dialyzer F180  ?Dialysate Temperature (C) 36  ?BFR-As tolerated to a maximum of: 400 mL/min  ?DFR 600 ml/min  ?Duration of Treatment 3 Hours  ?Dry weight (kg) or fluid removal (L) As Tolerated keep even  ?Access Site Dialysis Catheter  ?HD Standing Orders Yes  ? ?  ?   ?Comments ? ?No heparin, flush prn  ?New start to HD, was on PD  ?4K bath  ?From Aug 27, 2021 >> hep B SAg neg, Ab's < 3.1 = not protective  ?Will repeat studies given > 30 days  ? ? ?  ?   ? ?Reference Links ? ? ? ?  ?   ? ?Standing Order Information ? ?Remaining Occurrences Interval Last Released    ?0/1 Tomorrow 10/01/2021    ? ? ? ?  ?   ? ? ?Hemodialysis inpatient: Patient Communication ? ? Not Released  Not seen  ? ? ? ?Collection Information ? ? ? ? ? ? ? ?  ?Encounter ? ?View Encounter  ?  ? ? ?  ?   ? ?Tracking Links ? ?Cosign Tracking Order Transmittal Tracking  ? ? ? ?

## 2021-10-03 NOTE — Plan of Care (Signed)
  Problem: Nutrition: Goal: Adequate nutrition will be maintained Outcome: Progressing   Problem: Elimination: Goal: Will not experience complications related to bowel motility Outcome: Progressing   Problem: Pain Managment: Goal: General experience of comfort will improve Outcome: Progressing   

## 2021-10-04 NOTE — Progress Notes (Signed)
Late entry note: ? ?D/C summary and inpt HD order faxed to clinic per their request for continuation of care. Confirmed with RN at clinic that fax was received.  ? ?[name] ?Renal Navigator ?[phone] ?

## 2021-10-12 ENCOUNTER — Other Ambulatory Visit (HOSPITAL_COMMUNITY): Payer: Medicare Other

## 2021-10-25 ENCOUNTER — Ambulatory Visit (HOSPITAL_COMMUNITY): Admission: RE | Admit: 2021-10-25 | Payer: Medicare Other | Source: Ambulatory Visit

## 2021-11-13 ENCOUNTER — Other Ambulatory Visit (HOSPITAL_COMMUNITY): Payer: Medicare Other

## 2021-11-21 ENCOUNTER — Ambulatory Visit (HOSPITAL_COMMUNITY)
Admission: RE | Admit: 2021-11-21 | Discharge: 2021-11-21 | Disposition: A | Discharge status: Home / Self Care | Payer: Medicare Other | Source: Ambulatory Visit | Attending: Rehabilitation | Admitting: Rehabilitation

## 2021-11-21 DIAGNOSIS — M4316 Spondylolisthesis, lumbar region: Secondary | ICD-10-CM | POA: Diagnosis present

## 2021-11-21 DIAGNOSIS — M48062 Spinal stenosis, lumbar region with neurogenic claudication: Secondary | ICD-10-CM | POA: Insufficient documentation

## 2021-11-28 ENCOUNTER — Ambulatory Visit (INDEPENDENT_AMBULATORY_CARE_PROVIDER_SITE_OTHER): Payer: Medicare Other | Admitting: Internal Medicine

## 2021-11-28 ENCOUNTER — Encounter (INDEPENDENT_AMBULATORY_CARE_PROVIDER_SITE_OTHER): Payer: Self-pay | Admitting: Internal Medicine

## 2021-11-28 VITALS — BP 157/75 | HR 71 | Temp 98.2°F | Ht 59.0 in | Wt 110.5 lb

## 2021-11-28 DIAGNOSIS — K3184 Gastroparesis: Secondary | ICD-10-CM

## 2021-11-28 DIAGNOSIS — R634 Abnormal weight loss: Secondary | ICD-10-CM | POA: Insufficient documentation

## 2021-11-28 DIAGNOSIS — R630 Anorexia: Secondary | ICD-10-CM | POA: Diagnosis not present

## 2021-11-28 DIAGNOSIS — K862 Cyst of pancreas: Secondary | ICD-10-CM

## 2021-11-28 MED ORDER — FAMOTIDINE 40 MG PO TABS: 40.0000 mg | ORAL_TABLET | Freq: Every day | ORAL | 1 refills | Status: DC

## 2021-11-28 NOTE — Progress Notes (Signed)
Presenting complaint; ? ?Follow-up for nausea vomiting/gastroparesis and history of cystic pancreatic lesion. ? ?Database and subjective: ? ?Patient is 81 year old Caucasian female who is here for scheduled visit accompanied by her daughter [name].  Patient was last seen on 05/23/2021. ?She was diagnosed with gastroparesis when she presented last year.  She did respond to low-dose metoclopramide which was short-term use.  She did not do well with prucalopride and she has been maintained on dietary measures. ?During work-up she was found to have a cystic pancreatic lesion measuring 20 mm on MR of 1 year ago. ? ?Patient says she has been through a lot since was last seen.  She was admitted in February for peritonitis.  She was readmitted.  She developed cerebral edema with cefepime.  She had subclavian catheter in PD cannula removed on 09/30/2021. ?She has lost 18 pounds since her last visit.  Her weight loss occurred during this illness.  She denies heartburn nausea vomiting or nocturnal regurgitation.  She says her appetite is not good.  She has early satiety.  She denies dysphagia abdominal pain melena or rectal bleeding.  She has been having 1-2 loose stools per day but she states this morning she had a normal bowel movement.  She is using loperamide on as-needed basis.  She denies fever or chills. ?Her daughter is worried about pancreatic lesions and wonders if she should be checked now instead of next year. ? ?Current Medications: ?Outpatient Encounter Medications as of 11/28/2021  ?Medication Sig  ? acetaminophen (TYLENOL) 325 MG tablet Take 2 tablets (650 mg total) by mouth every 6 (six) hours as needed for mild pain.  ? Budeson-Glycopyrrol-Formoterol (BREZTRI AEROSPHERE) 160-9-4.8 MCG/ACT AERO 2 puffs Inhalation Twice a day for 30 days  ? camphor-menthol (SARNA) lotion Apply topically as needed for itching.  ? carvedilol (COREG) 12.5 MG tablet Take 1 tablet by mouth 2 (two) times daily.  ? cetirizine (ZYRTEC) 10 MG  tablet 1 tablet Orally Once a day for 90 days  ? diazepam (VALIUM) 5 MG tablet Take 5 mg by mouth daily as needed.  ? hydrALAZINE (APRESOLINE) 50 MG tablet TAKE 1 TABLET BY MOUTH WITH FOOD TWICE DAILY  ? hydrOXYzine (ATARAX) 10 MG tablet Take 1 tablet (10 mg total) by mouth 3 (three) times daily as needed for itching.  ? loperamide (IMODIUM) 2 MG capsule Take 1 capsule (2 mg total) by mouth as needed for diarrhea or loose stools.  ? multivitamin (RENA-VIT) TABS tablet Take 1 tablet by mouth at bedtime.  ? traZODone (DESYREL) 100 MG tablet 1 tablet at bedtime as needed Orally Once a day for 90 days  ? vancomycin (VANCOREADY) 500 MG/100ML IVPB Inject 100 mLs (500 mg total) into the vein Every Tuesday,Thursday,and Saturday with dialysis.  ? [DISCONTINUED] traZODone (DESYREL) 50 MG tablet Take 0.5-1 tablets (25-50 mg total) by mouth at bedtime as needed for sleep.  ? HYDROcodone-acetaminophen (NORCO) 10-325 MG tablet Take 0.5 tablets by mouth 2 (two) times daily as needed for moderate pain. (Patient not taking: Reported on 11/28/2021)  ? sevelamer carbonate (RENVELA) 2.4 g PACK Take 2.4 g by mouth 3 (three) times daily with meals. (Patient not taking: Reported on 11/28/2021)  ? ?No facility-administered encounter medications on file as of 11/28/2021.  ? ? ? ?Objective: ?Blood pressure (!) 157/75, pulse 71, temperature 98.2 ?F (36.8 ?C), temperature source Oral, height '4\' 11"'  (1.499 m), weight 110 lb 8 oz (50.1 kg). ?Patient is alert and in no acute distress. ?Conjunctiva is pink. Sclera is  nonicteric ?Oropharyngeal mucosa is normal. ?No neck masses or thyromegaly noted. ?Cardiac exam with regular rhythm normal S1 and S2. No murmur or gallop noted. ?Lungs are clear to auscultation. ?Abdomen is symmetrical soft and nontender with organomegaly or masses. ?No LE edema or clubbing noted. ? ?Labs/studies Results: ? ? ? ?  Latest Ref Rng & Units 10/02/2021  ?  6:59 AM 09/30/2021  ?  2:27 AM 09/29/2021  ?  3:02 AM  ?CBC  ?WBC 4.0 - 10.5  K/uL 7.5   6.9   7.6    ?Hemoglobin 12.0 - 15.0 g/dL 8.9   9.2   9.3    ?Hematocrit 36.0 - 46.0 % 25.3   25.9   26.5    ?Platelets 150 - 400 K/uL 263   239   232    ?  ? ?  Latest Ref Rng & Units 10/01/2021  ?  7:33 AM 09/30/2021  ?  2:27 AM 09/29/2021  ?  3:02 AM  ?CMP  ?Glucose 70 - 99 mg/dL 116   125   120    ?BUN 8 - 23 mg/dL 20   49   52    ?Creatinine 0.44 - 1.00 mg/dL 4.55   9.92   11.20    ?Sodium 135 - 145 mmol/L 134   131   131    ?Potassium 3.5 - 5.1 mmol/L 3.7   3.3   3.3    ?Chloride 98 - 111 mmol/L 99   91   94    ?CO2 22 - 32 mmol/L '25   26   23    ' ?Calcium 8.9 - 10.3 mg/dL 8.2   7.9   7.9    ?  ? ?  Latest Ref Rng & Units 09/27/2021  ? 10:22 PM 09/11/2021  ?  6:03 AM 09/08/2021  ?  5:27 AM  ?Hepatic Function  ?Total Protein 6.5 - 8.1 g/dL 7.2    6.0    ?Albumin 3.5 - 5.0 g/dL 3.3   2.4   2.3    ?AST 15 - 41 U/L 29    19    ?ALT 0 - 44 U/L 25    <5    ?Alk Phosphatase 38 - 126 U/L 103    85    ?Total Bilirubin 0.3 - 1.2 mg/dL 0.5    0.4    ?  ?Lab Results  ?Component Value Date  ? CRP 1.0 (H) 01/10/2019  ?  ? ? ?Assessment: ? ?#1.  Gastroparesis.  She is presently not on any promotility agent.  She may benefit from famotidine at bedtime as she is at risk for esophagitis. ? ?#2.  History of cystic pancreatic lesion picked up incidentally for ultrasound done for nausea and vomiting 1 year ago.  This lesion measured 2 cm on MRI.  I would recommend reevaluation now rather than waiting another year. ? ?#3.  Weight loss.  Patient has lost 18 pounds since her last visit of May 23, 2021.  Weight loss appears to be recent hospitalization for acute illness i.e. peritonitis. ? ? ?Plan: ? ?Famotidine 40 mg by mouth daily at bedtime.  Patient advised to stop this medication after 1 month if it does not help and if it does she should continue for 2 months. ?MR pancreas. ?Office visit date to be determined. ? ? ? ? ? ?

## 2021-11-28 NOTE — Patient Instructions (Signed)
Take famotidine 40 mg by mouth daily at bedtime.  Can stop this medication after 1 month if it does not help and if it does continue for total of 2 months. ?Physician will call with results of MR abdomen/pancreas. ?

## 2021-12-14 ENCOUNTER — Ambulatory Visit (HOSPITAL_COMMUNITY)
Admission: RE | Admit: 2021-12-14 | Discharge: 2021-12-14 | Disposition: A | Discharge status: Home / Self Care | Payer: Medicare Other | Source: Ambulatory Visit | Attending: Internal Medicine | Admitting: Internal Medicine

## 2021-12-14 ENCOUNTER — Other Ambulatory Visit (INDEPENDENT_AMBULATORY_CARE_PROVIDER_SITE_OTHER): Payer: Self-pay | Admitting: Internal Medicine

## 2021-12-14 DIAGNOSIS — K862 Cyst of pancreas: Secondary | ICD-10-CM

## 2022-02-28 ENCOUNTER — Emergency Department (HOSPITAL_COMMUNITY): Payer: Medicare Other

## 2022-02-28 ENCOUNTER — Other Ambulatory Visit: Payer: Self-pay

## 2022-02-28 ENCOUNTER — Encounter (HOSPITAL_COMMUNITY): Payer: Self-pay | Admitting: Emergency Medicine

## 2022-02-28 ENCOUNTER — Observation Stay (HOSPITAL_COMMUNITY)
Admission: EM | Admit: 2022-02-28 | Discharge: 2022-03-01 | Disposition: A | Discharge status: Home / Self Care | Payer: Medicare Other | Attending: Internal Medicine | Admitting: Internal Medicine

## 2022-02-28 ENCOUNTER — Emergency Department (HOSPITAL_BASED_OUTPATIENT_CLINIC_OR_DEPARTMENT_OTHER): Payer: Medicare Other

## 2022-02-28 DIAGNOSIS — K529 Noninfective gastroenteritis and colitis, unspecified: Secondary | ICD-10-CM | POA: Diagnosis not present

## 2022-02-28 DIAGNOSIS — E875 Hyperkalemia: Principal | ICD-10-CM

## 2022-02-28 DIAGNOSIS — R0602 Shortness of breath: Secondary | ICD-10-CM | POA: Diagnosis present

## 2022-02-28 DIAGNOSIS — Z20822 Contact with and (suspected) exposure to covid-19: Secondary | ICD-10-CM | POA: Diagnosis not present

## 2022-02-28 DIAGNOSIS — Z992 Dependence on renal dialysis: Secondary | ICD-10-CM | POA: Insufficient documentation

## 2022-02-28 DIAGNOSIS — N186 End stage renal disease: Secondary | ICD-10-CM | POA: Diagnosis not present

## 2022-02-28 DIAGNOSIS — I120 Hypertensive chronic kidney disease with stage 5 chronic kidney disease or end stage renal disease: Secondary | ICD-10-CM | POA: Insufficient documentation

## 2022-02-28 DIAGNOSIS — M7989 Other specified soft tissue disorders: Secondary | ICD-10-CM

## 2022-02-28 DIAGNOSIS — Z79899 Other long term (current) drug therapy: Secondary | ICD-10-CM | POA: Insufficient documentation

## 2022-02-28 DIAGNOSIS — E8770 Fluid overload, unspecified: Secondary | ICD-10-CM | POA: Insufficient documentation

## 2022-02-28 DIAGNOSIS — R1013 Epigastric pain: Secondary | ICD-10-CM | POA: Insufficient documentation

## 2022-02-28 HISTORY — DX: Dependence on renal dialysis: N18.6

## 2022-02-28 HISTORY — DX: End stage renal disease: Z99.2

## 2022-02-28 LAB — CBC WITH DIFFERENTIAL/PLATELET
Abs Immature Granulocytes: 0.03 10*3/uL (ref 0.00–0.07)
Basophils Absolute: 0 10*3/uL (ref 0.0–0.1)
Basophils Relative: 1 %
Eosinophils Absolute: 0.1 10*3/uL (ref 0.0–0.5)
Eosinophils Relative: 1 %
HCT: 40.4 % (ref 36.0–46.0)
Hemoglobin: 12.6 g/dL (ref 12.0–15.0)
Immature Granulocytes: 0 %
Lymphocytes Relative: 7 %
Lymphs Abs: 0.6 10*3/uL — ABNORMAL LOW (ref 0.7–4.0)
MCH: 27.8 pg (ref 26.0–34.0)
MCHC: 31.2 g/dL (ref 30.0–36.0)
MCV: 89 fL (ref 80.0–100.0)
Monocytes Absolute: 0.5 10*3/uL (ref 0.1–1.0)
Monocytes Relative: 6 %
Neutro Abs: 7 10*3/uL (ref 1.7–7.7)
Neutrophils Relative %: 85 %
Platelets: 230 10*3/uL (ref 150–400)
RBC: 4.54 MIL/uL (ref 3.87–5.11)
RDW: 18.9 % — ABNORMAL HIGH (ref 11.5–15.5)
WBC: 8.2 10*3/uL (ref 4.0–10.5)
nRBC: 0 % (ref 0.0–0.2)

## 2022-02-28 LAB — BASIC METABOLIC PANEL
Anion gap: 13 (ref 5–15)
BUN: 29 mg/dL — ABNORMAL HIGH (ref 8–23)
CO2: 22 mmol/L (ref 22–32)
Calcium: 9.3 mg/dL (ref 8.9–10.3)
Chloride: 103 mmol/L (ref 98–111)
Creatinine, Ser: 7.54 mg/dL — ABNORMAL HIGH (ref 0.44–1.00)
GFR, Estimated: 5 mL/min — ABNORMAL LOW (ref >60)
Glucose, Bld: 82 mg/dL (ref 70–99)
Potassium: 6.1 mmol/L — ABNORMAL HIGH (ref 3.5–5.1)
Sodium: 138 mmol/L (ref 135–145)

## 2022-02-28 LAB — HEPATIC FUNCTION PANEL
ALT: 13 U/L (ref 0–44)
AST: 21 U/L (ref 15–41)
Albumin: 3 g/dL — ABNORMAL LOW (ref 3.5–5.0)
Alkaline Phosphatase: 83 U/L (ref 38–126)
Bilirubin, Direct: 0.1 mg/dL (ref 0.0–0.2)
Indirect Bilirubin: 0.8 mg/dL (ref 0.3–0.9)
Total Bilirubin: 0.9 mg/dL (ref 0.3–1.2)
Total Protein: 6.7 g/dL (ref 6.5–8.1)

## 2022-02-28 LAB — HEPATITIS B SURFACE ANTIBODY,QUALITATIVE: Hep B S Ab: NONREACTIVE

## 2022-02-28 LAB — HEPATITIS C ANTIBODY: HCV Ab: NONREACTIVE

## 2022-02-28 LAB — HEPATITIS B CORE ANTIBODY, TOTAL: Hep B Core Total Ab: NONREACTIVE

## 2022-02-28 LAB — BRAIN NATRIURETIC PEPTIDE: B Natriuretic Peptide: 3203.1 pg/mL — ABNORMAL HIGH (ref 0.0–100.0)

## 2022-02-28 LAB — HEPATITIS B SURFACE ANTIGEN: Hepatitis B Surface Ag: NONREACTIVE

## 2022-02-28 LAB — TROPONIN I (HIGH SENSITIVITY)
Troponin I (High Sensitivity): 23 ng/L — ABNORMAL HIGH (ref <18)
Troponin I (High Sensitivity): 25 ng/L — ABNORMAL HIGH (ref <18)

## 2022-02-28 LAB — LIPASE, BLOOD: Lipase: 34 U/L (ref 11–51)

## 2022-02-28 LAB — PHOSPHORUS: Phosphorus: 4.8 mg/dL — ABNORMAL HIGH (ref 2.5–4.6)

## 2022-02-28 MED ORDER — SODIUM CHLORIDE 0.9% FLUSH: 3.0000 mL | INTRAVENOUS | Status: DC | PRN

## 2022-02-28 MED ORDER — LOPERAMIDE HCL 2 MG PO CAPS: 2.0000 mg | ORAL_CAPSULE | ORAL | Status: DC | PRN

## 2022-02-28 MED ORDER — BACID PO TABS
2.0000 | ORAL_TABLET | Freq: Three times a day (TID) | ORAL | Status: DC
  Filled 2022-02-28 (×3): qty 2

## 2022-02-28 MED ORDER — CALCIUM GLUCONATE-NACL 1-0.675 GM/50ML-% IV SOLN
1.0000 g | Freq: Once | INTRAVENOUS | Status: AC
  Administered 2022-02-28: 1000 mg via INTRAVENOUS
  Filled 2022-02-28: qty 50

## 2022-02-28 MED ORDER — IOHEXOL 350 MG/ML SOLN
65.0000 mL | Freq: Once | INTRAVENOUS | Status: AC | PRN
  Administered 2022-02-28: 65 mL via INTRAVENOUS

## 2022-02-28 MED ORDER — SODIUM CHLORIDE 0.9% FLUSH
3.0000 mL | Freq: Two times a day (BID) | INTRAVENOUS | Status: DC
  Administered 2022-03-01: 3 mL via INTRAVENOUS

## 2022-02-28 MED ORDER — SODIUM CHLORIDE 0.9 % IV SOLN: 250.0000 mL | INTRAVENOUS | Status: DC | PRN

## 2022-02-28 MED ORDER — FENTANYL CITRATE PF 50 MCG/ML IJ SOSY
50.0000 ug | PREFILLED_SYRINGE | Freq: Once | INTRAMUSCULAR | Status: AC
  Administered 2022-02-28: 50 ug via INTRAVENOUS
  Filled 2022-02-28: qty 1

## 2022-02-28 MED ORDER — ONDANSETRON HCL 4 MG/2ML IJ SOLN: 4.0000 mg | Freq: Four times a day (QID) | INTRAMUSCULAR | Status: DC | PRN

## 2022-02-28 MED ORDER — CHLORHEXIDINE GLUCONATE CLOTH 2 % EX PADS
6.0000 | MEDICATED_PAD | Freq: Every day | CUTANEOUS | Status: DC
  Administered 2022-03-01: 6 via TOPICAL

## 2022-02-28 MED ORDER — INSULIN ASPART 100 UNIT/ML IJ SOLN
5.0000 [IU] | Freq: Once | INTRAMUSCULAR | Status: AC
  Administered 2022-02-28: 5 [IU] via INTRAVENOUS

## 2022-02-28 MED ORDER — ACETAMINOPHEN 325 MG PO TABS
650.0000 mg | ORAL_TABLET | ORAL | Status: DC | PRN
Start: 2022-02-28 — End: 2022-03-01

## 2022-02-28 MED ORDER — SODIUM ZIRCONIUM CYCLOSILICATE 10 G PO PACK
10.0000 g | PACK | Freq: Once | ORAL | Status: DC
  Filled 2022-02-28: qty 1

## 2022-02-28 MED ORDER — DEXTROSE 50 % IV SOLN
2.0000 | Freq: Once | INTRAVENOUS | Status: AC
  Administered 2022-02-28: 100 mL via INTRAVENOUS
  Filled 2022-02-28: qty 100

## 2022-02-28 MED ORDER — HYDRALAZINE HCL 25 MG PO TABS
25.0000 mg | ORAL_TABLET | Freq: Four times a day (QID) | ORAL | Status: DC | PRN
  Administered 2022-02-28: 25 mg via ORAL
  Filled 2022-02-28: qty 1

## 2022-02-28 MED ORDER — HEPARIN SODIUM (PORCINE) 5000 UNIT/ML IJ SOLN
5000.0000 [IU] | Freq: Two times a day (BID) | INTRAMUSCULAR | Status: DC
  Administered 2022-02-28 – 2022-03-01 (×2): 5000 [IU] via SUBCUTANEOUS
  Filled 2022-02-28 (×2): qty 1

## 2022-02-28 NOTE — ED Notes (Signed)
This NT found the patient a table to eat her dinner on. Call bell in reach.

## 2022-02-28 NOTE — H&P (Signed)
History and Physical    Aviyana Sonntag SNK:[id number] DOB: 06-18-1941 DOA: 02/28/2022  PCP: [name], NP (Confirm with patient/family/NH records and if not entered, this has to be entered at [facility] point of entry) Patient coming from: Home  I have personally briefly reviewed patient's old medical records in [place]  Chief Complaint: Feeling nausea, abdominal pain, diarrhea  HPI: Felicia Williams is a 81 y.o. female with medical history significant of gastroparesis, ESRD on HD, HTN, chronic pancreatic cyst, gout seen for worsening of her GI symptoms including abdominal pain, diarrhea and new onset of shortness of breath.  Patient is ESRD used to be on PD, which switched to HD in March after an episode of SBP.  She has been compliant with her HD Monday Wednesday Friday last HD was Monday.  Starting yesterday, patient gradually developed shortness of breath no cough no chest pain.  Last 2 weeks, she also has had persistent epigastric abdominal cramping pain intermittent associated nauseous and occasional vomiting of stomach contents.  She also has a chronic diarrhea watery like, 3-4 times a day which also worsened in the last 2 weeks.  Denies any tenesmus.  She has been taking the as needed Imodium with somewhat relief.  She went to see GI in May, when she was prescribed of at bedtime famotidine.  ED Course: No tachycardia, blood pressure elevated no hypoxia.  X-ray showed mild congestion no cardiomegaly.  CT abdomen pelvis showed question of enteritis.  Review of Systems: As per HPI otherwise 14 point review of systems negative.    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 Laural Golden.   GERD (gastroesophageal reflux disease)     Glomerulonephritis    Gout    Mixed hyperlipidemia    Spondylosis     Past Surgical History:  Procedure Laterality Date   APPENDECTOMY     CATARACT EXTRACTION     COLONOSCOPY N/A 12/28/2015   Procedure: COLONOSCOPY;  Surgeon: Rogene Houston, MD;  [facility];  Service: Endoscopy;  Laterality: N/A;  815   ESOPHAGOGASTRODUODENOSCOPY N/A 11/16/2020   Procedure: ESOPHAGOGASTRODUODENOSCOPY (EGD);  Surgeon: Rogene Houston, MD;  [facility];  Service: Endoscopy;  Laterality: N/A;  1:15   INSERTION OF DIALYSIS CATHETER Right 09/30/2021   Procedure: INSERTION OF TUNNELED DIALYSIS CATHETER;  Surgeon: Waynetta Sandy, MD;  Location: [place];  Service: Vascular;  Laterality: Right;   IR FLUORO GUIDE CV LINE RIGHT  01/16/2019   IR REMOVAL TUN CV CATH W/O FL  07/01/2019   IR US GUIDE VASC ACCESS RIGHT  01/16/2019   POLYPECTOMY  11/16/2020   Procedure: POLYPECTOMY;  Surgeon: Rogene Houston, MD;  [facility];  Service: Endoscopy;;  gastric   REMOVAL OF A DIALYSIS CATHETER  09/30/2021   Procedure: REMOVAL OF A PERITONEAL DIALYSIS CATHETER;  Surgeon: Waynetta Sandy, MD;  Location: [place];  Service: Vascular;;   TUBAL LIGATION       reports that she has never smoked. She has never been exposed to tobacco smoke. She has never used smokeless tobacco. She reports that she does not drink alcohol and does not use drugs.  Allergies  Allergen Reactions   Cefepime Other (See Comments)    Feb 2023 Encephalopathy with questionable seizures. Seems to be tolerating cefazolin    Morphine     "Dry heaving like crazy"   Sulfa Antibiotics Other (See Comments)    Shut pt's kidneys down    Family History  Problem Relation Age of Onset   CAD Father    Heart attack Father    Diabetes Mellitus II Father    Hypertension Father    Lupus Brother      Prior to Admission medications   Medication Sig Start Date End Date Taking? Authorizing Provider  acetaminophen  (TYLENOL) 325 MG tablet Take 2 tablets (650 mg total) by mouth every 6 (six) hours as needed for mild pain. 09/12/21  Yes Love, Ivan Anchors, PA-C  camphor-menthol [facility]) lotion Apply topically as needed for itching. 09/12/21  Yes Love, Ivan Anchors, PA-C  carvedilol (COREG) 12.5 MG tablet Take 1 tablet by mouth 2 (two) times daily.   Yes [provider]  diazepam (VALIUM) 5 MG tablet Take 5 mg by mouth once as needed for anxiety (going to spine doctor). 11/23/21  Yes [provider]  hydrALAZINE (APRESOLINE) 50 MG tablet Take 50 mg by mouth 2 (two) times daily.   Yes [provider]  HYDROcodone-acetaminophen (NORCO) 10-325 MG tablet Take 1 tablet by mouth 2 (two) times daily as needed for moderate pain. 02/01/22  Yes [provider]  ketoconazole (NIZORAL) 2 % cream Apply 1 Application topically daily. 02/15/22  Yes [provider]  loperamide (IMODIUM) 2 MG capsule Take 1 capsule (2 mg total) by mouth as needed for diarrhea or loose stools. 09/12/21  Yes Love, Ivan Anchors, PA-C  multivitamin (RENA-VIT) TABS tablet Take 1 tablet by mouth at bedtime. 01/20/19  Yes Nita Sells, MD  traZODone (DESYREL) 100 MG tablet Take 100 mg by mouth at bedtime as needed for sleep.   Yes [provider]  famotidine (PEPCID) 40 MG tablet Take 1 tablet (40 mg total) by mouth at bedtime. Patient not taking: Reported on 02/28/2022 11/28/21   Rogene Houston, MD  hydrOXYzine (ATARAX) 10 MG tablet Take 1 tablet (10 mg total) by mouth 3 (three) times daily as needed for itching. Patient not taking: Reported on 02/28/2022 09/12/21   Flora Lipps    Physical Exam: Vitals:   02/28/22 1430 02/28/22 1530 02/28/22 1600 02/28/22 1705  BP: (!) 224/89 (!) 179/67 (!) 193/72 (!) 211/76  Pulse: 78 78 81 70  Resp: 20 (!) 22 19 (!) 24  Temp:    97.9 F (36.6 C)  TempSrc:    Oral  SpO2: 98% 96% 97% 97%    Constitutional: NAD, calm, comfortable Vitals:   02/28/22 1430 02/28/22  1530 02/28/22 1600 02/28/22 1705  BP: (!) 224/89 (!) 179/67 (!) 193/72 (!) 211/76  Pulse: 78 78 81 70  Resp: 20 (!) 22 19 (!) 24  Temp:    97.9 F (36.6 C)  TempSrc:    Oral  SpO2: 98% 96% 97% 97%   Eyes: PERRL, lids and conjunctivae normal ENMT: Mucous membranes are moist. Posterior pharynx clear of any exudate or lesions.Normal dentition.  Neck: normal, supple, no masses, no thyromegaly Respiratory: clear to auscultation bilaterally, no wheezing, no crackles. Normal respiratory effort. No accessory muscle use.  Cardiovascular: Regular rate and rhythm, no murmurs / rubs / gallops. No extremity edema. 2+ pedal pulses. No carotid bruits.  Abdomen: mild tenderness on epigastric area, no rebound no  guarding, no masses palpated. No hepatosplenomegaly. Bowel sounds positive.  Musculoskeletal: no clubbing / cyanosis. No joint deformity upper and lower extremities. Good ROM, no contractures. Normal muscle tone.  Skin: no rashes, lesions, ulcers. No induration Neurologic: CN 2-12 grossly intact. Sensation intact, DTR normal. Strength 5/5 in all 4.  Psychiatric: Normal judgment and insight. Alert and oriented x 3. Normal mood.     Labs on Admission: I have personally reviewed following labs and imaging studies  CBC: Recent Labs  Lab 02/28/22 1237  WBC 8.2  NEUTROABS 7.0  HGB 12.6  HCT 40.4  MCV 89.0  PLT 378   Basic Metabolic Panel: Recent Labs  Lab 02/28/22 1237  NA 138  K 6.1*  CL 103  CO2 22  GLUCOSE 82  BUN 29*  CREATININE 7.54*  CALCIUM 9.3   GFR: CrCl cannot be calculated (Unknown ideal weight.). Liver Function Tests: Recent Labs  Lab 02/28/22 1510  AST 21  ALT 13  ALKPHOS 83  BILITOT 0.9  PROT 6.7  ALBUMIN 3.0*   Recent Labs  Lab 02/28/22 1510  LIPASE 34   No results for input(s): "AMMONIA" in the last 168 hours. Coagulation Profile: No results for input(s): "INR", "PROTIME" in the last 168 hours. Cardiac Enzymes: No results for input(s): "CKTOTAL",  "CKMB", "CKMBINDEX", "TROPONINI" in the last 168 hours. BNP (last 3 results) No results for input(s): "PROBNP" in the last 8760 hours. HbA1C: No results for input(s): "HGBA1C" in the last 72 hours. CBG: No results for input(s): "GLUCAP" in the last 168 hours. Lipid Profile: No results for input(s): "CHOL", "HDL", "LDLCALC", "TRIG", "CHOLHDL", "LDLDIRECT" in the last 72 hours. Thyroid Function Tests: No results for input(s): "TSH", "T4TOTAL", "FREET4", "T3FREE", "THYROIDAB" in the last 72 hours. Anemia Panel: No results for input(s): "VITAMINB12", "FOLATE", "FERRITIN", "TIBC", "IRON", "RETICCTPCT" in the last 72 hours. Urine analysis:    Component Value Date/Time   COLORURINE COLORLESS (A) 08/29/2021 0827   APPEARANCEUR CLEAR 08/29/2021 0827   LABSPEC 1.005 08/29/2021 0827   PHURINE 8.0 08/29/2021 0827   GLUCOSEU >=500 (A) 08/29/2021 0827   HGBUR NEGATIVE 08/29/2021 0827   BILIRUBINUR NEGATIVE 08/29/2021 0827   KETONESUR NEGATIVE 08/29/2021 0827   PROTEINUR 30 (A) 08/29/2021 0827   UROBILINOGEN 0.2 01/11/2014 1635   NITRITE NEGATIVE 08/29/2021 0827   LEUKOCYTESUR NEGATIVE 08/29/2021 0827    Radiological Exams on Admission: CT ABDOMEN PELVIS W CONTRAST  Result Date: 02/28/2022 CLINICAL DATA:  Nausea and vomiting EXAM: CT ABDOMEN AND PELVIS WITH CONTRAST TECHNIQUE: Multidetector CT imaging of the abdomen and pelvis was performed using the standard protocol following bolus administration of intravenous contrast. RADIATION DOSE REDUCTION: This exam was performed according to the departmental dose-optimization program which includes automated exposure control, adjustment of the mA and/or kV according to patient size and/or use of iterative reconstruction technique. CONTRAST:  42mL OMNIPAQUE IOHEXOL 350 MG/ML SOLN COMPARISON:  09/27/2021, 12/14/2021 FINDINGS: Lower chest: Small bilateral pleural effusions, with scattered areas of dependent lower lobe atelectasis. Hepatobiliary: No focal  liver abnormality is seen. No gallstones, gallbladder wall thickening, or biliary dilatation. Pancreas: Stable 1.5 cm hypodensity within the pancreatic body consistent with IPMN based on previous MRI. Please refer to the MRI report 12/14/2021 for follow-up recommendations. There are no acute inflammatory changes or pancreatic duct dilation. Spleen: Normal in size without focal abnormality. Adrenals/Urinary Tract: Bilateral renal cortical thinning and atrophy consistent with end-stage renal disease. No urinary tract calculi or obstructive uropathy. The bladder is decompressed, limiting its evaluation. The adrenals are  unremarkable. Stomach/Bowel: No bowel obstruction or ileus. The appendix is surgically absent. Interposed between the lateral margin inferior edge of the liver and the right lateral abdominal wall is a short small bowel segment with surrounding free fluid and mesenteric stranding, consistent with focal enteritis. This is best seen on image 32/3. Vascular/Lymphatic: Aortic atherosclerosis. No enlarged abdominal or pelvic lymph nodes. Reproductive: Uterus and bilateral adnexa are unremarkable. Other: Trace free fluid along the inferior margin of the liver adjacent to the presumed inflamed small bowel segment described above. No free intraperitoneal gas. No abdominal wall hernia. Interval removal of the peritoneal dialysis catheter seen previously. Musculoskeletal: No acute or destructive bony lesions. Reconstructed images demonstrate no additional findings. IMPRESSION: 1. Short segment of small bowel inflammation interposed between the lateral margin of the liver and lateral abdominal wall as described above, consistent with acute enteritis or focal small-bowel diverticulitis. No evidence of perforation, fluid collection, or abscess. 2. Small bilateral pleural effusions with dependent atelectasis. 3. 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 Please refer to previous MRI 12/14/2021 for follow-up recommendations. 4.   Aortic Atherosclerosis (ICD10-I70.0). Electronically Signed   By: Randa Ngo M.D.   On: 02/28/2022 17:11   VAS [place] LOWER EXTREMITY VENOUS (DVT) (ONLY [facility] & [facility])  Result Date: 02/28/2022  Lower Venous DVT Study Patient Name:  ERMALEE MEALY  Date of Exam:   02/28/2022 Medical Rec #: [id number]         Accession #:    [id number] Date of Birth: [birth year]         Patient Gender: F Patient Age:   41 years Exam Location:  [facility] Procedure:      VAS [place] LOWER EXTREMITY VENOUS (DVT) Referring Phys: Gareth Morgan --------------------------------------------------------------------------------  Indications: Swelling.  Comparison Study: No previous exam noted. Performing Technologist: Bobetta Lime BS, RVT  Examination Guidelines: A complete evaluation includes B-mode imaging, spectral Doppler, color Doppler, and power Doppler as needed of all accessible portions of each vessel. Bilateral testing is considered an integral part of a complete examination. Limited examinations for reoccurring indications may be performed as noted. The reflux portion of the exam is performed with the patient in reverse Trendelenburg.  +-----+---------------+---------+-----------+----------+--------------+ RIGHTCompressibilityPhasicitySpontaneityPropertiesThrombus Aging +-----+---------------+---------+-----------+----------+--------------+ CFV  Full           Yes      Yes                                 +-----+---------------+---------+-----------+----------+--------------+   +---------+---------------+---------+-----------+----------+--------------+ LEFT     CompressibilityPhasicitySpontaneityPropertiesThrombus Aging +---------+---------------+---------+-----------+----------+--------------+ CFV      Full           Yes      Yes                                 +---------+---------------+---------+-----------+----------+--------------+ SFJ      Full                                                         +---------+---------------+---------+-----------+----------+--------------+ FV Prox  Full                                                        +---------+---------------+---------+-----------+----------+--------------+  FV Mid   Full                                                        +---------+---------------+---------+-----------+----------+--------------+ FV DistalFull                                                        +---------+---------------+---------+-----------+----------+--------------+ PFV      Full                                                        +---------+---------------+---------+-----------+----------+--------------+ POP      Full           Yes      Yes                                 +---------+---------------+---------+-----------+----------+--------------+ PTV      Full                                                        +---------+---------------+---------+-----------+----------+--------------+ PERO     Full                                                        +---------+---------------+---------+-----------+----------+--------------+     Summary: RIGHT: - No evidence of common femoral vein obstruction.  LEFT: - No evidence of deep vein thrombosis in the lower extremity. No indirect evidence of obstruction proximal to the inguinal ligament. - No cystic structure found in the popliteal fossa.  *See table(s) above for measurements and observations.    Preliminary    DG Chest 2 View  Result Date: 02/28/2022 CLINICAL DATA:  Shortness of breath with epigastric pain since last night. Missed dialysis today. EXAM: CHEST - 2 VIEW COMPARISON:  Radiographs 09/30/2021 and 08/28/2021. FINDINGS: Right IJ hemodialysis catheter extends to the superior cavoatrial junction. The heart size and mediastinal contours are stable with mild aortic atherosclerosis. Interstitial prominence has mildly progressed from 01/16/2021 and could reflect  mild edema. There are possible small bilateral pleural effusions. No confluent airspace opacity or pneumothorax. The aeration of the lungs has improved compared with the most recent prior study. No acute osseous findings. IMPRESSION: 1. Although the aeration of the lung bases has improved compared with the most recent prior studies, there is increased interstitial prominence which could reflect mild edema and possible small bilateral pleural effusions. 2. No confluent airspace opacity. Electronically Signed   By: Richardean Sale M.D.   On: 02/28/2022 12:50    EKG: Independently reviewed.  Sinus, no acute ST changes  Assessment/Plan Principal Problem:   Hyperkalemia  (please  populate well all problems here in Problem List. (For example, if patient is on BP meds at home and you resume or decide to hold them, it is a problem that needs to be her. Same for CAD, COPD, HLD and so on)  Hyperkalemia -No ST-T changes on EKG, received Lokelma x 1, nephrology on board, emergency HD tonight  Acute enteritis -Etiology unknown, abdominal exam benign.  Given the onset was more than 3 days ago, will check stool for GI pathogens. -Other DDx, also suspect bacterial overgrowth given the poorly controlled gastroparesis. -Trial of probiotics, continue as needed Imodium  HTN, poorly controlled -Continue Coreg and hydralazine, added as needed hydralazine  Chronic pancreatic cyst -Was first identified last year, MRCP this year showed stable appearance of the pancreatic cyst. -Given the patient has a chronic diarrhea, may need to consider rule out pancreatic insufficiency.  Outpatient follow-up with GI, check stool fat.  History of gastroparesis -As per GI as recommendation in May, patient has been taking famotidine at bedtime.  DVT prophylaxis: Heparin subcu Code Status: Full code Family Communication: Daughter at bedside Disposition Plan: Expect less than 2 midnight hospital stay Consults called:  Nephrology Admission status: Telemetry observation   Lequita Halt MD [facility] Pager [phone]  02/28/2022, 5:55 PM

## 2022-02-28 NOTE — ED Triage Notes (Signed)
Pt presents with shob  x 2 days, getting worse.  Nausea  Back pain.  Pt states she did not go to dialysis today.

## 2022-02-28 NOTE — Consult Note (Signed)
Renal Service Consult Note [facility]  Felicia Williams 02/28/2022 Sol Blazing, MD Requesting Physician: Dr. Ronnald Nian  Reason for Consult: ESRD pt w/ SOB, n/v HPI: The patient is a 81 y.o. year-old w/ hx of anemia, ESRD on HD, depression, HTN, gastroparesis, gout, HL,   Patient was on [facility] for about 4 yrs, f/b [facility] group now. She had bad peritonitis infection culture negative in Feb 2023 which was treated. Had recurrent infection in March 2023 and PD cath was removed due to persistent and worsening abd pain. Cx's grew staph epi. Pt was started on HD via [facility] and was dc'd to [facility] HD.   Today the dtr states she looked "really bad" this am, was vomiting and was very SOB/ dyspneic. She looks much better now. Pt has no c/o at this time. Pt states her TDC "clogged" at her last HD on Monday and she might not have had a full HD session. She notes is a bit better now. No cough, CP or abd pain.   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 Laural Golden.   GERD (gastroesophageal reflux disease)    Glomerulonephritis    Gout    Mixed hyperlipidemia    Renal insufficiency    Spondylosis    Past Surgical History  Past Surgical History:  Procedure Laterality Date   APPENDECTOMY     CATARACT EXTRACTION     COLONOSCOPY N/A 12/28/2015   Procedure: COLONOSCOPY;  Surgeon: Rogene Houston, MD;  [facility];  Service: Endoscopy;  Laterality: N/A;  815   ESOPHAGOGASTRODUODENOSCOPY N/A 11/16/2020   Procedure: ESOPHAGOGASTRODUODENOSCOPY (EGD);  Surgeon: Rogene Houston, MD;  Location: [facility];   Service: Endoscopy;  Laterality: N/A;  1:15   INSERTION OF DIALYSIS CATHETER Right 09/30/2021   Procedure: INSERTION OF TUNNELED DIALYSIS CATHETER;  Surgeon: Waynetta Sandy, MD;  Location: [place];  Service: Vascular;  Laterality: Right;   IR FLUORO GUIDE CV LINE RIGHT  01/16/2019   IR REMOVAL TUN CV CATH W/O FL  07/01/2019   IR US GUIDE VASC ACCESS RIGHT  01/16/2019   POLYPECTOMY  11/16/2020   Procedure: POLYPECTOMY;  Surgeon: Rogene Houston, MD;  [facility];  Service: Endoscopy;;  gastric   REMOVAL OF A DIALYSIS CATHETER  09/30/2021   Procedure: REMOVAL OF A PERITONEAL DIALYSIS CATHETER;  Surgeon: Waynetta Sandy, MD;  Location: [facility] OR;  Service: Vascular;;   TUBAL LIGATION     Family History  Family History  Problem Relation Age of Onset   CAD Father    Heart attack Father    Diabetes Mellitus II Father    Hypertension Father    Lupus Brother    Social History  reports that she has never smoked. She has never been exposed to tobacco smoke. She has never used smokeless tobacco. She reports that she does not drink alcohol and does not use drugs. Allergies  Allergies  Allergen Reactions   Cefepime Other (See Comments)    Feb 2023 Encephalopathy with questionable seizures.  Seems to be tolerating cefazolin    Morphine     "Dry heaving like crazy"   Sulfa Antibiotics Other (See Comments)    Shut pt's kidneys down   Home medications Prior to Admission medications   Medication Sig Start Date End Date Taking? Authorizing Provider  acetaminophen (TYLENOL) 325 MG tablet Take 2 tablets (650 mg total) by mouth every 6 (six) hours as needed for mild pain. 09/12/21   Love, Ivan Anchors, PA-C  Budeson-Glycopyrrol-Formoterol (BREZTRI AEROSPHERE) 160-9-4.8 MCG/ACT AERO 2 puffs Inhalation Twice a day for 30 days 11/21/21   [provider]  camphor-menthol Timoteo Ace) lotion Apply topically as needed for itching. 09/12/21   Love, Ivan Anchors, PA-C  carvedilol (COREG) 12.5 MG  tablet Take 1 tablet by mouth 2 (two) times daily.    [provider]  cetirizine (ZYRTEC) 10 MG tablet 1 tablet Orally Once a day for 90 days 11/21/21   [provider]  diazepam (VALIUM) 5 MG tablet Take 5 mg by mouth daily as needed. 11/23/21   [provider]  famotidine (PEPCID) 40 MG tablet Take 1 tablet (40 mg total) by mouth at bedtime. 11/28/21   Rehman, Mechele Dawley, MD  hydrALAZINE (APRESOLINE) 50 MG tablet TAKE 1 TABLET BY MOUTH WITH FOOD TWICE DAILY    [provider]  hydrOXYzine (ATARAX) 10 MG tablet Take 1 tablet (10 mg total) by mouth 3 (three) times daily as needed for itching. 09/12/21   Love, Ivan Anchors, PA-C  loperamide (IMODIUM) 2 MG capsule Take 1 capsule (2 mg total) by mouth as needed for diarrhea or loose stools. 09/12/21   Love, Ivan Anchors, PA-C  multivitamin (RENA-VIT) TABS tablet Take 1 tablet by mouth at bedtime. 01/20/19   Nita Sells, MD  traZODone (DESYREL) 100 MG tablet 1 tablet at bedtime as needed Orally Once a day for 90 days    [provider]     Vitals:   02/28/22 1217 02/28/22 1430 02/28/22 1530 02/28/22 1600  BP: (!) 205/79 (!) 224/89 (!) 179/67 (!) 193/72  Pulse: 77 78 78 81  Resp: 17 20 (!) 22 19  Temp: 99.1 F (37.3 C)     TempSrc: Oral     SpO2: 96% 98% 96% 97%   Exam Gen alert, no distress No rash, cyanosis or gangrene Sclera anicteric, throat clear  No jvd or bruits Chest clear bilat to bases, no rales/ wheezing RRR no MRG Abd soft ntnd no mass or ascites +bs GU defer MS no joint effusions or deformity Ext trace-1+ ankle edema, no wounds or ulcers Neuro is alert, Ox 3 , nf    RIJ TDC intact   Home meds include - breztri aerosphere 2 bid, carvedilol 12.5 bid, diazepam qd prn, famotidine, hydralazine 50 bid, renavite, trazodone, prns/ vits/ supps   OP HD: MWF [name] 3.5h   49kg  RIJ TDC  400/500  3K/2.5 bath  Heparin 1000 + 1000u/hr - mircera 150 q2, last given 8/7 - IV fe 50mg  /wk - post  HD wt on Monday was 49.9kg - outpt BP's have been worse lately   Na 138  K 6.1  Co2 22 BUN 29  Creat 7.5  Ca 9.3  alb 3.0  BNP 3200    CXR possible mild IS edema new from last cxr   WBC 8k Hb 12    EKG NSR QRS 84 msec, no acute changes   Assessment/ Plan: SOB - possible vol overload, CXR changes are equivocal, however SOB in  a dialysis patient is usually volume related.  Will plan HD tonight, large UF 3-4 L as tolerated.  ESRD - on HD MWF in [place] at [place], [place]. HD tonight as above.  Hyperkalemia - mild, got IV Ca/ insulin/ glucose in ED.  Will dc lokelma order as pt doesn't want to drink it and K+ will be handled w/ HD tonight.  HTN - cont home meds Anemia esrd - had esa 2 days ago, not due for another 12 days. Follow. Hb good MBd ckd - CCa in range. Add on phos. Not sure what her binder is.       Kelly Splinter  MD 02/28/2022, 4:26 PM Recent Labs  Lab 02/28/22 1237 02/28/22 1510  HGB 12.6  --   ALBUMIN  --  3.0*  CALCIUM 9.3  --   CREATININE 7.54*  --   K 6.1*  --

## 2022-02-28 NOTE — ED Notes (Signed)
Pt alert, NAD, calm, interactive, resps e/u, speaking in clear complete sentences. BP improving. Family at [facility]. Denies pain, nausea, dizziness, or sob at this time. Pending HD tonight.

## 2022-02-28 NOTE — Progress Notes (Signed)
Left LE venous duplex study completed. Please see CV Proc for preliminary results.  Bobetta Lime BS, RVT 02/28/2022 4:42 PM

## 2022-02-28 NOTE — ED Provider Notes (Signed)
Patient admitted to medicine for further care.  Hyperkalemia likely in the setting of some shortened dialysis.  CT scan shows may be some enteritis.  But lab work is otherwise unremarkable.   Lennice Sites, DO 02/28/22 1715

## 2022-02-28 NOTE — ED Provider Triage Note (Signed)
Emergency Medicine Provider Triage Evaluation Note  Felicia Williams , a 81 y.o. female  was evaluated in triage.  Pt complains of shortness of breath which has been progressively going for the past 2 days.  Also began experiencing some nausea and vomiting which occurred this morning after she tried taking a piece of her egg breakfast.  She is currently on dialysis Monday, Wednesday, Friday according to [facility] member at the bedside patient did not have dialysis today.  She does a prior history of peritonitis, reports prior hospitalizations for these with similar symptoms..  Review of Systems  Positive: Abdominal pain (epigastric), nausea, vomiting, diarrhea Negative: Chest pain, leg swelling  Physical Exam  BP (!) 205/79 (BP Location: Right Arm)   Pulse 77   Temp 99.1 F (37.3 C) (Oral)   Resp 17   SpO2 96%  Gen:   Awake, no distress   Resp:  Normal effort  MSK:   Moves extremities without difficulty  Other:    Medical Decision Making  Medically screening exam initiated at 12:27 PM.  Appropriate orders placed.  Marjan Rosman was informed that the remainder of the evaluation will be completed by another provider, this initial triage assessment does not replace that evaluation, and the importance of remaining in the ED until their evaluation is complete.     Janeece Fitting, PA-C 02/28/22 1231

## 2022-02-28 NOTE — ED Provider Notes (Signed)
[name] Note   CSN: [id number] Arrival date & time: 02/28/22  1129     History  Chief Complaint  Patient presents with   Shortness of Breath    Felicia Williams is a 81 y.o. female.  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 Birdie Sons who presents with concern for shortness of breath and abdominal pain.   She reports she has had difficulty with her catheter and Monday they had to stop dialysis 35min early because of difficulty using the catheter.    Yesterday, she began to develop shortness of breath, worse with exertion and laying down. She also developed nausea and vomiting and a sensation of tightness at the top of her abdomen.  No fever. Has had diarrhea 3x today.  No black or bloody stools. No chest pain.  Has had left more than right leg swelling over the last 2 weeks.  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 Laural Golden.   GERD (gastroesophageal reflux disease)    Glomerulonephritis    Gout    Mixed hyperlipidemia    Spondylosis      Home Medications Prior to Admission medications   Medication Sig Start Date End Date Taking? Authorizing Provider  acetaminophen (TYLENOL) 325 MG tablet Take 2 tablets (650 mg total) by mouth every 6 (six) hours as needed for mild pain. 09/12/21  Yes Love, Ivan Anchors, PA-C  camphor-menthol [facility]) lotion Apply topically as needed for itching. 09/12/21  Yes Love, Ivan Anchors, PA-C  carvedilol  (COREG) 12.5 MG tablet Take 1 tablet by mouth 2 (two) times daily.   Yes [provider]  diazepam (VALIUM) 5 MG tablet Take 5 mg by mouth once as needed for anxiety (going to spine doctor). 11/23/21  Yes [provider]  hydrALAZINE (APRESOLINE) 50 MG tablet Take 50 mg by mouth 2 (two) times daily.   Yes [provider]  HYDROcodone-acetaminophen (NORCO) 10-325 MG tablet Take 1 tablet by mouth 2 (two) times daily as needed for moderate pain. 02/01/22  Yes [provider]  ketoconazole (NIZORAL) 2 % cream Apply 1 Application topically daily. 02/15/22  Yes [provider]  loperamide (IMODIUM) 2 MG capsule Take 1 capsule (2 mg total) by mouth as needed for diarrhea or loose stools. 09/12/21  Yes Love, Ivan Anchors, PA-C  multivitamin (RENA-VIT) TABS tablet Take 1 tablet by mouth at bedtime. 01/20/19  Yes Nita Sells, MD  traZODone (DESYREL) 100 MG tablet Take 100 mg by mouth at bedtime as needed for sleep.   Yes [provider]  famotidine (PEPCID) 40 MG tablet Take 1 tablet (40 mg total) by mouth at bedtime. Patient not taking: Reported on 02/28/2022 11/28/21   Rogene Houston, MD  hydrOXYzine (ATARAX) 10 MG tablet Take 1 tablet (10 mg total) by mouth 3 (three) times daily as needed for itching. Patient not taking: Reported on 02/28/2022 09/12/21   Bary Leriche, PA-C  Allergies    Cefepime, Morphine, and Sulfa antibiotics    Review of Systems   Review of Systems  Physical Exam Updated Vital Signs BP (!) 158/62   Pulse 74   Temp 99.4 F (37.4 C) (Oral)   Resp (!) 21   SpO2 92%  Physical Exam  ED Results / Procedures / Treatments   Labs (all labs ordered are listed, but only abnormal results are displayed) Labs Reviewed  BASIC METABOLIC PANEL - Abnormal; Notable for the following components:      Result Value   Potassium 6.1 (*)    BUN 29 (*)    Creatinine, Ser 7.54 (*)    GFR, Estimated 5 (*)    All other components within  normal limits  CBC WITH DIFFERENTIAL/PLATELET - Abnormal; Notable for the following components:   RDW 18.9 (*)    Lymphs Abs 0.6 (*)    All other components within normal limits  BRAIN NATRIURETIC PEPTIDE - Abnormal; Notable for the following components:   B Natriuretic Peptide 3,203.1 (*)    All other components within normal limits  HEPATIC FUNCTION PANEL - Abnormal; Notable for the following components:   Albumin 3.0 (*)    All other components within normal limits  PHOSPHORUS - Abnormal; Notable for the following components:   Phosphorus 4.8 (*)    All other components within normal limits  TROPONIN I (HIGH SENSITIVITY) - Abnormal; Notable for the following components:   Troponin I (High Sensitivity) 23 (*)    All other components within normal limits  TROPONIN I (HIGH SENSITIVITY) - Abnormal; Notable for the following components:   Troponin I (High Sensitivity) 25 (*)    All other components within normal limits  GASTROINTESTINAL PANEL BY PCR, STOOL (REPLACES STOOL CULTURE)  SARS CORONAVIRUS 2 BY RT PCR  LIPASE, BLOOD  HEPATITIS B SURFACE ANTIGEN  HEPATITIS B SURFACE ANTIBODY,QUALITATIVE  HEPATITIS B CORE ANTIBODY, TOTAL  HEPATITIS C ANTIBODY  HEPATITIS B SURFACE ANTIBODY, QUANTITATIVE  FECAL FAT, QUALITATIVE  BASIC METABOLIC PANEL    EKG EKG Interpretation  Date/Time:  Wednesday February 28 2022 12:27:04 EDT Ventricular Rate:  78 PR Interval:  122 QRS Duration: 84 QT Interval:  410 QTC Calculation: 467 R Axis:   -36 Text Interpretation: Normal sinus rhythm Left axis deviation Nonspecific ST and T wave abnormality Abnormal ECG When compared with ECG of 27-Sep-2021 22:04, nonspecific TW changes present Confirmed by Gareth Morgan [phone]) on 02/28/2022 2:22:38 PM  Radiology CT ABDOMEN PELVIS W CONTRAST  Result Date: 02/28/2022 CLINICAL DATA:  Nausea and vomiting EXAM: CT ABDOMEN AND PELVIS WITH CONTRAST TECHNIQUE: Multidetector CT imaging of the abdomen and pelvis was  performed using the standard protocol following bolus administration of intravenous contrast. RADIATION DOSE REDUCTION: This exam was performed according to the departmental dose-optimization program which includes automated exposure control, adjustment of the mA and/or kV according to patient size and/or use of iterative reconstruction technique. CONTRAST:  30mL OMNIPAQUE IOHEXOL 350 MG/ML SOLN COMPARISON:  09/27/2021, 12/14/2021 FINDINGS: Lower chest: Small bilateral pleural effusions, with scattered areas of dependent lower lobe atelectasis. Hepatobiliary: No focal liver abnormality is seen. No gallstones, gallbladder wall thickening, or biliary dilatation. Pancreas: Stable 1.5 cm hypodensity within the pancreatic body consistent with IPMN based on previous MRI. Please refer to the MRI report 12/14/2021 for follow-up recommendations. There are no acute inflammatory changes or pancreatic duct dilation. Spleen: Normal in size without focal abnormality. Adrenals/Urinary Tract: Bilateral renal cortical thinning and atrophy consistent with end-stage renal disease. No  urinary tract calculi or obstructive uropathy. The bladder is decompressed, limiting its evaluation. The adrenals are unremarkable. Stomach/Bowel: No bowel obstruction or ileus. The appendix is surgically absent. Interposed between the lateral margin inferior edge of the liver and the right lateral abdominal wall is a short small bowel segment with surrounding free fluid and mesenteric stranding, consistent with focal enteritis. This is best seen on image 32/3. Vascular/Lymphatic: Aortic atherosclerosis. No enlarged abdominal or pelvic lymph nodes. Reproductive: Uterus and bilateral adnexa are unremarkable. Other: Trace free fluid along the inferior margin of the liver adjacent to the presumed inflamed small bowel segment described above. No free intraperitoneal gas. No abdominal wall hernia. Interval removal of the peritoneal dialysis catheter seen  previously. Musculoskeletal: No acute or destructive bony lesions. Reconstructed images demonstrate no additional findings. IMPRESSION: 1. Short segment of small bowel inflammation interposed between the lateral margin of the liver and lateral abdominal wall as described above, consistent with acute enteritis or focal small-bowel diverticulitis. No evidence of perforation, fluid collection, or abscess. 2. Small bilateral pleural effusions with dependent atelectasis. 3. 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 Please refer to previous MRI 12/14/2021 for follow-up recommendations. 4.  Aortic Atherosclerosis (ICD10-I70.0). Electronically Signed   By: Randa Ngo M.D.   On: 02/28/2022 17:11   VAS [place] LOWER EXTREMITY VENOUS (DVT) (ONLY [facility] & [facility])  Result Date: 02/28/2022  Lower Venous DVT Study Patient Name:  Felicia Williams  Date of Exam:   02/28/2022 Medical Rec #: [id number]         Accession #:    [id number] Date of Birth: [birth year]         Patient Gender: F Patient Age:   78 years Exam Location:  [facility] Procedure:      VAS [place] LOWER EXTREMITY VENOUS (DVT) Referring Phys: Gareth Morgan --------------------------------------------------------------------------------  Indications: Swelling.  Comparison Study: No previous exam noted. Performing Technologist: Bobetta Lime BS, RVT  Examination Guidelines: A complete evaluation includes B-mode imaging, spectral Doppler, color Doppler, and power Doppler as needed of all accessible portions of each vessel. Bilateral testing is considered an integral part of a complete examination. Limited examinations for reoccurring indications may be performed as noted. The reflux portion of the exam is performed with the patient in reverse Trendelenburg.  +-----+---------------+---------+-----------+----------+--------------+ RIGHTCompressibilityPhasicitySpontaneityPropertiesThrombus Aging +-----+---------------+---------+-----------+----------+--------------+ CFV  Full            Yes      Yes                                 +-----+---------------+---------+-----------+----------+--------------+   +---------+---------------+---------+-----------+----------+--------------+ LEFT     CompressibilityPhasicitySpontaneityPropertiesThrombus Aging +---------+---------------+---------+-----------+----------+--------------+ CFV      Full           Yes      Yes                                 +---------+---------------+---------+-----------+----------+--------------+ SFJ      Full                                                        +---------+---------------+---------+-----------+----------+--------------+ FV Prox  Full                                                        +---------+---------------+---------+-----------+----------+--------------+  FV Mid   Full                                                        +---------+---------------+---------+-----------+----------+--------------+ FV DistalFull                                                        +---------+---------------+---------+-----------+----------+--------------+ PFV      Full                                                        +---------+---------------+---------+-----------+----------+--------------+ POP      Full           Yes      Yes                                 +---------+---------------+---------+-----------+----------+--------------+ PTV      Full                                                        +---------+---------------+---------+-----------+----------+--------------+ PERO     Full                                                        +---------+---------------+---------+-----------+----------+--------------+     Summary: RIGHT: - No evidence of common femoral vein obstruction.  LEFT: - No evidence of deep vein thrombosis in the lower extremity. No indirect evidence of obstruction proximal to the inguinal ligament. - No cystic  structure found in the popliteal fossa.  *See table(s) above for measurements and observations.    Preliminary    DG Chest 2 View  Result Date: 02/28/2022 CLINICAL DATA:  Shortness of breath with epigastric pain since last night. Missed dialysis today. EXAM: CHEST - 2 VIEW COMPARISON:  Radiographs 09/30/2021 and 08/28/2021. FINDINGS: Right IJ hemodialysis catheter extends to the superior cavoatrial junction. The heart size and mediastinal contours are stable with mild aortic atherosclerosis. Interstitial prominence has mildly progressed from 01/16/2021 and could reflect mild edema. There are possible small bilateral pleural effusions. No confluent airspace opacity or pneumothorax. The aeration of the lungs has improved compared with the most recent prior study. No acute osseous findings. IMPRESSION: 1. Although the aeration of the lung bases has improved compared with the most recent prior studies, there is increased interstitial prominence which could reflect mild edema and possible small bilateral pleural effusions. 2. No confluent airspace opacity. Electronically Signed   By: Richardean Sale M.D.   On: 02/28/2022 12:50    Procedures .Critical Care  Performed by: Gareth Morgan, MD Authorized by: Gareth Morgan, MD   Critical care  provider statement:    Critical care time (minutes):  30   Critical care was time spent personally by me on the following activities:  Development of treatment plan with patient or surrogate, discussions with consultants, evaluation of patient's response to treatment, examination of patient, ordering and review of laboratory studies, ordering and review of radiographic studies, ordering and performing treatments and interventions, pulse oximetry, re-evaluation of patient's condition and review of old charts Ultrasound ED Peripheral IV (Provider)  Date/Time: 02/28/2022 11:14 PM  Performed by: Gareth Morgan, MD Authorized by: Gareth Morgan, MD   Procedure details:     Indications: multiple failed IV attempts     Location:  Right AC   Angiocath:  22 G   Bedside Ultrasound Guided: Yes     Images: not archived     Dressing applied: Yes       Medications Ordered in ED Medications  Chlorhexidine Gluconate Cloth 2 % PADS 6 each (has no administration in time range)  hydrALAZINE (APRESOLINE) tablet 25 mg (25 mg Oral Given 02/28/22 1809)  lactobacillus acidophilus (BACID) tablet 2 tablet (2 tablets Oral Not Given 02/28/22 1826)  loperamide (IMODIUM) capsule 2 mg (has no administration in time range)  sodium chloride flush (NS) 0.9 % injection 3 mL (has no administration in time range)  sodium chloride flush (NS) 0.9 % injection 3 mL (has no administration in time range)  0.9 %  sodium chloride infusion (has no administration in time range)  acetaminophen (TYLENOL) tablet 650 mg (has no administration in time range)  ondansetron (ZOFRAN) injection 4 mg (has no administration in time range)  heparin injection 5,000 Units (has no administration in time range)  ondansetron (ZOFRAN) injection 4 mg (has no administration in time range)  insulin aspart (novoLOG) injection 5 Units (5 Units Intravenous Given 02/28/22 1550)  dextrose 50 % solution 100 mL (100 mLs Intravenous Given 02/28/22 1524)  calcium gluconate 1 g/ 50 mL sodium chloride IVPB (0 mg Intravenous Stopped 02/28/22 1627)  iohexol (OMNIPAQUE) 350 MG/ML injection 65 mL (65 mLs Intravenous Contrast Given 02/28/22 1658)  fentaNYL (SUBLIMAZE) injection 50 mcg (50 mcg Intravenous Given 02/28/22 1717)    ED Course/ Medical Decision Making/ A&P                           Medical Decision Making Amount and/or Complexity of Data Reviewed Labs: ordered. Radiology: ordered.  Risk Prescription drug management. Decision regarding hospitalization.   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 Birdie Sons who presents with concern for shortness of breath and abdominal pain.    Differential diagnosis for dyspnea includes ACS, PE, COPD exacerbation, CHF exacerbation, anemia, pneumonia, viral etiology such as COVID 19 infection, metabolic abnormality.    Chest x-ray was done and personally evaluated by me showed no acute. EKG was evaluated by me which showed nonspecific TW changes.  BNP 3000s.Marland Kitchen     BMP shows K 6.1. ECG without significant changes.  Given calcium, insulin, dextrose, lokelma for hyperkalemia and consulted Dr. Jonnie Finner Nephrology.  Suspect CXR secondary to volume overload, possible CHF>  Lower suspicion for PE. DVT study was ordered and pending given LLE swelling.    CT abdomen pelvis ordered for abdominal pain to evaluate for SBO, diverticulitis, cholecystitis and care signed out to Dr. Ronnald Nian with labs and CT pending  with plan for admission.         Final Clinical Impression(s) / ED Diagnoses Final diagnoses:  Hyperkalemia  Hypervolemia, unspecified hypervolemia type  Epigastric pain  Shortness of breath    Rx / DC Orders ED Discharge Orders     None         Gareth Morgan, MD 02/28/22 2314

## 2022-02-28 NOTE — ED Notes (Signed)
994

## 2022-03-01 ENCOUNTER — Other Ambulatory Visit (HOSPITAL_COMMUNITY): Payer: Self-pay

## 2022-03-01 DIAGNOSIS — E875 Hyperkalemia: Secondary | ICD-10-CM | POA: Diagnosis not present

## 2022-03-01 DIAGNOSIS — R0602 Shortness of breath: Secondary | ICD-10-CM

## 2022-03-01 DIAGNOSIS — E8770 Fluid overload, unspecified: Secondary | ICD-10-CM

## 2022-03-01 LAB — BASIC METABOLIC PANEL
Anion gap: 9 (ref 5–15)
BUN: 35 mg/dL — ABNORMAL HIGH (ref 8–23)
CO2: 25 mmol/L (ref 22–32)
Calcium: 8.6 mg/dL — ABNORMAL LOW (ref 8.9–10.3)
Chloride: 102 mmol/L (ref 98–111)
Creatinine, Ser: 8.82 mg/dL — ABNORMAL HIGH (ref 0.44–1.00)
GFR, Estimated: 4 mL/min — ABNORMAL LOW (ref >60)
Glucose, Bld: 87 mg/dL (ref 70–99)
Potassium: 5.3 mmol/L — ABNORMAL HIGH (ref 3.5–5.1)
Sodium: 136 mmol/L (ref 135–145)

## 2022-03-01 LAB — SARS CORONAVIRUS 2 BY RT PCR: SARS Coronavirus 2 by RT PCR: NEGATIVE

## 2022-03-01 LAB — MRSA NEXT GEN BY PCR, NASAL: MRSA by PCR Next Gen: NOT DETECTED

## 2022-03-01 LAB — HEPATITIS B SURFACE ANTIBODY, QUANTITATIVE: Hep B S AB Quant (Post): <3.1 m[IU]/mL — ABNORMAL LOW (ref >9.9)

## 2022-03-01 MED ORDER — CAMPHOR-MENTHOL 0.5-0.5 % EX LOTN: TOPICAL_LOTION | CUTANEOUS | Status: DC | PRN

## 2022-03-01 MED ORDER — KETOCONAZOLE 2 % EX CREA
1.0000 | TOPICAL_CREAM | Freq: Every day | CUTANEOUS | Status: DC
  Filled 2022-03-01 (×2): qty 15

## 2022-03-01 MED ORDER — ENSURE ENLIVE PO LIQD: 237.0000 mL | Freq: Two times a day (BID) | ORAL | Status: DC

## 2022-03-01 MED ORDER — RISAQUAD PO CAPS
2.0000 | ORAL_CAPSULE | Freq: Three times a day (TID) | ORAL | Status: DC
  Administered 2022-03-01: 2 via ORAL
  Filled 2022-03-01 (×2): qty 2

## 2022-03-01 MED ORDER — HEPARIN SODIUM (PORCINE) 1000 UNIT/ML DIALYSIS: 2000.0000 [IU] | INTRAMUSCULAR | Status: DC | PRN

## 2022-03-01 MED ORDER — RENA-VITE PO TABS: 1.0000 | ORAL_TABLET | Freq: Every day | ORAL | Status: DC

## 2022-03-01 MED ORDER — TRAZODONE HCL 50 MG PO TABS
100.0000 mg | ORAL_TABLET | Freq: Every evening | ORAL | Status: DC | PRN
Start: 2022-03-01 — End: 2022-03-01

## 2022-03-01 MED ORDER — BACID PO TABS
2.0000 | ORAL_TABLET | Freq: Three times a day (TID) | ORAL | 0 refills | Status: AC
  Filled 2022-03-01: qty 84, 14d supply, fill #0

## 2022-03-01 MED ORDER — HYDROCODONE-ACETAMINOPHEN 10-325 MG PO TABS
1.0000 | ORAL_TABLET | Freq: Two times a day (BID) | ORAL | Status: DC | PRN
  Administered 2022-03-01: 1 via ORAL
  Filled 2022-03-01: qty 1

## 2022-03-01 MED ORDER — ACETAMINOPHEN 325 MG PO TABS: 650.0000 mg | ORAL_TABLET | Freq: Four times a day (QID) | ORAL | Status: DC | PRN

## 2022-03-01 MED ORDER — ORAL CARE MOUTH RINSE: 15.0000 mL | OROMUCOSAL | Status: DC | PRN

## 2022-03-01 MED ORDER — HYDRALAZINE HCL 50 MG PO TABS
50.0000 mg | ORAL_TABLET | Freq: Two times a day (BID) | ORAL | Status: DC
  Administered 2022-03-01: 50 mg via ORAL
  Filled 2022-03-01: qty 1

## 2022-03-01 MED ORDER — CARVEDILOL 12.5 MG PO TABS
12.5000 mg | ORAL_TABLET | Freq: Two times a day (BID) | ORAL | Status: DC
  Administered 2022-03-01: 12.5 mg via ORAL
  Filled 2022-03-01: qty 1

## 2022-03-01 MED ORDER — DIAZEPAM 5 MG PO TABS: 5.0000 mg | ORAL_TABLET | Freq: Once | ORAL | Status: DC | PRN

## 2022-03-01 NOTE — Progress Notes (Signed)
Mobility Specialist Criteria Algorithm Info.   03/01/22 1020  Mobility  Activity Ambulated with assistance in hallway  Range of Motion/Exercises Active;All extremities  Level of Assistance Standby assist, set-up cues, supervision of patient - no hands on  Assistive Device None  Distance Ambulated (ft) 140 ft  Activity Response Tolerated well   Patient received dangling EOB eager to participate in mobility. Ambulated supervision level with slow gait. SpO2 maintained >91% on RA while ambulating. Tolerated without complaint or incident. Was left dangling EOB with all needs met, call bell in reach.   03/01/2022 10:41 AM  [place] Julyana [place], CMS, [place]  VHSJW:[phone] Office: [phone]

## 2022-03-01 NOTE — Discharge Instructions (Signed)
Nutrition Post Hospital Stay °Proper nutrition can help your body recover from illness and injury.   °Foods and beverages high in protein, vitamins, and minerals help rebuild muscle loss, promote healing, & reduce fall risk.  ° °•In addition to eating healthy foods, a nutrition shake is an easy, delicious way to get the nutrition you need during and after your hospital stay ° °It is recommended that you continue to drink 2 bottles per day of:       Ensure Plus for at least 1 month (30 days) after your hospital stay  ° °Tips for adding a nutrition shake into your routine: °As allowed, drink one with vitamins or medications instead of water or juice °Enjoy one as a tasty mid-morning or afternoon snack °Drink cold or make a milkshake out of it °Drink one instead of milk with cereal or snacks °Use as a coffee creamer °  °Available at the following grocery stores and pharmacies:           °* [employer] * [employer] * [employer]  °* [employer]          * [employer] * [employer]  °* [facility]      * Target  * [employer]'s   °* [employer]  * [employer]   °* [facility] [phone]  °          °For COUPONS visit: www.ensure.com/join or www.boost.com/members/sign-up  ° °Suggested Substitutions °Ensure Plus = Boost Plus = Carnation Breakfast Essentials = Boost Compact ° ° °  ° °

## 2022-03-01 NOTE — Progress Notes (Signed)
Heart Failure Navigator Progress Note  Assessed for Heart & Vascular TOC clinic readiness.  Patient does not meet criteria due to hemodialysis patient.     Earnestine Leys, BSN, Clinical cytogeneticist Only

## 2022-03-01 NOTE — Progress Notes (Signed)
Nsg Discharge Note  Admit Date:  02/28/2022 Discharge date: 03/01/2022   Felicia Williams to be D/C'd Home per MD order.  AVS completed. Patient/caregiver able to verbalize understanding.  Discharge Medication: Allergies as of 03/01/2022       Reactions   Cefepime Other (See Comments)   Feb 2023 Encephalopathy with questionable seizures. Seems to be tolerating cefazolin    Morphine    "Dry heaving like crazy"   Sulfa Antibiotics Other (See Comments)   Shut pt's kidneys down        Medication List     STOP taking these medications    hydrOXYzine 10 MG tablet Commonly known as: ATARAX       TAKE these medications    acetaminophen 325 MG tablet Commonly known as: TYLENOL Take 2 tablets (650 mg total) by mouth every 6 (six) hours as needed for mild pain.   camphor-menthol lotion Commonly known as: SARNA Apply topically as needed for itching.   carvedilol 12.5 MG tablet Commonly known as: COREG Take 1 tablet by mouth 2 (two) times daily.   diazepam 5 MG tablet Commonly known as: VALIUM Take 5 mg by mouth once as needed for anxiety (going to spine doctor).   famotidine 40 MG tablet Commonly known as: Pepcid Take 1 tablet (40 mg total) by mouth at bedtime.   hydrALAZINE 50 MG tablet Commonly known as: APRESOLINE Take 50 mg by mouth 2 (two) times daily.   HYDROcodone-acetaminophen 10-325 MG tablet Commonly known as: NORCO Take 1 tablet by mouth 2 (two) times daily as needed for moderate pain.   ketoconazole 2 % cream Commonly known as: NIZORAL Apply 1 Application topically daily.   loperamide 2 MG capsule Commonly known as: IMODIUM Take 1 capsule (2 mg total) by mouth as needed for diarrhea or loose stools.   multivitamin Tabs tablet Take 1 tablet by mouth at bedtime.   Risa-Bid Probiotic Tabs Take 2 tablets by mouth 3 (three) times daily for 14 days.   traZODone 100 MG tablet Commonly known as: DESYREL Take 100 mg by mouth at bedtime as needed for  sleep.        Discharge Assessment: Vitals:   03/01/22 0750 03/01/22 0844  BP: 132/69 (!) 165/70  Pulse: 69 71  Resp: 20 18  Temp: 98 F (36.7 C) 98.4 F (36.9 C)  SpO2: 97% 93%   Skin clean, dry and intact without evidence of skin break down, no evidence of skin tears noted. IV catheter discontinued intact. Site without signs and symptoms of complications - no redness or edema noted at insertion site, patient denies c/o pain - only slight tenderness at site.  Dressing with slight pressure applied.  D/c Instructions-Education: Discharge instructions given to patient/family with verbalized understanding. D/c education completed with patient/family including follow up instructions, medication list, d/c activities limitations if indicated, with other d/c instructions as indicated by MD - patient able to verbalize understanding, all questions fully answered. Patient instructed to return to ED, call 911, or call MD for any changes in condition.  Patient escorted via [place], and D/C home via private auto.  Atilano Ina, RN 03/01/2022 3:39 PM

## 2022-03-01 NOTE — Progress Notes (Signed)
Received patient in bed to unit.  Alert and oriented.  Informed consent signed and in  chart.   Treatment initiated: [place] Treatment completed: 0750  Patient tolerated well.  Transported back to the room  alert, without acute distress.  Hand-off given to patient's nurse.   Access used: Catheter Access issues: none  Total UF removed: 3500 Medication(s) given: n/a Post HD VS: 98.0,132/69,10,98% Post HD weight: 45.1kg   [facility]

## 2022-03-01 NOTE — Progress Notes (Signed)
Pt receives out-pt HD at [facility] on MWF. Pt to d/c to home today. Contacted clinic and spoke to [place]. Clinic advised of pt's d/c and that pt will resume care tomorrow. H and P, renal consult, and renal note for today faxed to clinic for continuation of care. Will fax d/c summary once available. Clinic also requested that pt's covid test results be faxed to clinic as well. Navigator faxed test result this afternoon along with other documents.     [name] Renal Navigator [phone]

## 2022-03-01 NOTE — Discharge Summary (Signed)
Physician Discharge Summary   Patient: Felicia Williams MRN: [id number] DOB: [birth year]  Admit date:     02/28/2022  Discharge date: 03/01/22  Discharge Physician: Marylu Lund   PCP: [name], NP   Recommendations at discharge:    Follow up with PCP in 1-2 weeks Follow up with scheduled HD  Discharge Diagnoses: Principal Problem:   Hyperkalemia  Resolved Problems:   * No resolved hospital problems. *  Hospital Course: 81 y.o. female with medical history significant of gastroparesis, ESRD on HD, HTN, chronic pancreatic cyst, gout seen for worsening of her GI symptoms including abdominal pain, diarrhea and new onset of shortness of breath.   Patient is ESRD used to be on PD, which switched to HD in March after an episode of SBP.  She has been compliant with her HD Monday Wednesday Friday last HD was Monday.  Starting yesterday, patient gradually developed shortness of breath no cough no chest pain.  Last 2 weeks, she also has had persistent epigastric abdominal cramping pain intermittent associated nauseous and occasional vomiting of stomach contents.  She also has a chronic diarrhea watery like, 3-4 times a day which also worsened in the last 2 weeks.  Denies any tenesmus.  She has been taking the as needed Imodium with somewhat relief.  She went to see GI in May, when she was prescribed of at bedtime famotidine.   ED Course: No tachycardia, blood pressure elevated no hypoxia.  X-ray showed mild congestion no cardiomegaly.   CT abdomen pelvis showed question of enteritis.  Assessment and Plan: Hyperkalemia -No ST-T changes on EKG, received Lokelma x 1, nephrology on board, underwent emergency HD, resolved -Discussed with Nephrology, clear for d/c    Acute enteritis -Etiology unknown, abdominal exam benign.   -resolved this visit with pt tolerating diet   HTN, poorly controlled -Continue Coreg and hydralazine   Chronic pancreatic cyst -Was first identified last year,  MRCP this year showed stable appearance of the pancreatic cyst. -Given the patient has a chronic diarrhea, may need to consider rule out pancreatic insufficiency.  Outpatient follow-up with GI, check stool fat.   History of gastroparesis -As per GI as recommendation in May, patient has been taking famotidine at bedtime.        Consultants: Nephrology Procedures performed:   Disposition: Home Diet recommendation:  Renal diet DISCHARGE MEDICATION: Allergies as of 03/01/2022       Reactions   Cefepime Other (See Comments)   Feb 2023 Encephalopathy with questionable seizures. Seems to be tolerating cefazolin    Morphine    "Dry heaving like crazy"   Sulfa Antibiotics Other (See Comments)   Shut pt's kidneys down        Medication List     STOP taking these medications    hydrOXYzine 10 MG tablet Commonly known as: ATARAX       TAKE these medications    acetaminophen 325 MG tablet Commonly known as: TYLENOL Take 2 tablets (650 mg total) by mouth every 6 (six) hours as needed for mild pain.   acidophilus Caps capsule Take 2 capsules by mouth 3 (three) times daily for 14 days.   camphor-menthol lotion Commonly known as: SARNA Apply topically as needed for itching.   carvedilol 12.5 MG tablet Commonly known as: COREG Take 1 tablet by mouth 2 (two) times daily.   diazepam 5 MG tablet Commonly known as: VALIUM Take 5 mg by mouth once as needed for anxiety (going to spine doctor).  famotidine 40 MG tablet Commonly known as: Pepcid Take 1 tablet (40 mg total) by mouth at bedtime.   hydrALAZINE 50 MG tablet Commonly known as: APRESOLINE Take 50 mg by mouth 2 (two) times daily.   HYDROcodone-acetaminophen 10-325 MG tablet Commonly known as: NORCO Take 1 tablet by mouth 2 (two) times daily as needed for moderate pain.   ketoconazole 2 % cream Commonly known as: NIZORAL Apply 1 Application topically daily.   loperamide 2 MG capsule Commonly known as:  IMODIUM Take 1 capsule (2 mg total) by mouth as needed for diarrhea or loose stools.   multivitamin Tabs tablet Take 1 tablet by mouth at bedtime.   traZODone 100 MG tablet Commonly known as: DESYREL Take 100 mg by mouth at bedtime as needed for sleep.        Discharge Exam: Filed Weights   03/01/22 0323 03/01/22 0750 03/01/22 1015  Weight: 49.6 kg 45.1 kg 45.7 kg   General exam: Awake, laying in bed, in nad Respiratory system: Normal respiratory effort, no wheezing Cardiovascular system: regular rate, s1, s2 Gastrointestinal system: Soft, nondistended, positive BS Central nervous system: CN2-12 grossly intact, strength intact Extremities: Perfused, no clubbing Skin: Normal skin turgor, no notable skin lesions seen Psychiatry: Mood normal // no visual hallucinations   Condition at discharge: fair  The results of significant diagnostics from this hospitalization (including imaging, microbiology, ancillary and laboratory) are listed below for reference.   Imaging Studies: VAS [place] LOWER EXTREMITY VENOUS (DVT) (ONLY [facility] & [facility])  Result Date: 02/28/2022  Lower Venous DVT Study Patient Name:  Felicia Williams  Date of Exam:   02/28/2022 Medical Rec #: [id number]         Accession #:    [id number] Date of Birth: 12-May-1941         Patient Gender: F Patient Age:   67 years Exam Location:  [facility] Procedure:      VAS [place] LOWER EXTREMITY VENOUS (DVT) Referring Phys: Gareth Morgan --------------------------------------------------------------------------------  Indications: Swelling.  Comparison Study: No previous exam noted. Performing Technologist: Bobetta Lime BS, RVT  Examination Guidelines: A complete evaluation includes B-mode imaging, spectral Doppler, color Doppler, and power Doppler as needed of all accessible portions of each vessel. Bilateral testing is considered an integral part of a complete examination. Limited examinations for reoccurring indications may be performed as  noted. The reflux portion of the exam is performed with the patient in reverse Trendelenburg.  +-----+---------------+---------+-----------+----------+--------------+ RIGHTCompressibilityPhasicitySpontaneityPropertiesThrombus Aging +-----+---------------+---------+-----------+----------+--------------+ CFV  Full           Yes      Yes                                 +-----+---------------+---------+-----------+----------+--------------+   +---------+---------------+---------+-----------+----------+--------------+ LEFT     CompressibilityPhasicitySpontaneityPropertiesThrombus Aging +---------+---------------+---------+-----------+----------+--------------+ CFV      Full           Yes      Yes                                 +---------+---------------+---------+-----------+----------+--------------+ SFJ      Full                                                        +---------+---------------+---------+-----------+----------+--------------+  FV Prox  Full                                                        +---------+---------------+---------+-----------+----------+--------------+ FV Mid   Full                                                        +---------+---------------+---------+-----------+----------+--------------+ FV DistalFull                                                        +---------+---------------+---------+-----------+----------+--------------+ PFV      Full                                                        +---------+---------------+---------+-----------+----------+--------------+ POP      Full           Yes      Yes                                 +---------+---------------+---------+-----------+----------+--------------+ PTV      Full                                                        +---------+---------------+---------+-----------+----------+--------------+ PERO     Full                                                         +---------+---------------+---------+-----------+----------+--------------+     Summary: RIGHT: - No evidence of common femoral vein obstruction.  LEFT: - No evidence of deep vein thrombosis in the lower extremity. No indirect evidence of obstruction proximal to the inguinal ligament. - No cystic structure found in the popliteal fossa.  *See table(s) above for measurements and observations. Electronically signed by Harold Barban MD on 02/28/2022 at 11:18:38 PM.    Final    CT ABDOMEN PELVIS W CONTRAST  Result Date: 02/28/2022 CLINICAL DATA:  Nausea and vomiting EXAM: CT ABDOMEN AND PELVIS WITH CONTRAST TECHNIQUE: Multidetector CT imaging of the abdomen and pelvis was performed using the standard protocol following bolus administration of intravenous contrast. RADIATION DOSE REDUCTION: This exam was performed according to the departmental dose-optimization program which includes automated exposure control, adjustment of the mA and/or kV according to patient size and/or use of iterative reconstruction technique. CONTRAST:  63mL OMNIPAQUE IOHEXOL 350 MG/ML SOLN COMPARISON:  09/27/2021, 12/14/2021 FINDINGS: Lower chest: Small bilateral pleural effusions, with scattered areas of dependent lower lobe  atelectasis. Hepatobiliary: No focal liver abnormality is seen. No gallstones, gallbladder wall thickening, or biliary dilatation. Pancreas: Stable 1.5 cm hypodensity within the pancreatic body consistent with IPMN based on previous MRI. Please refer to the MRI report 12/14/2021 for follow-up recommendations. There are no acute inflammatory changes or pancreatic duct dilation. Spleen: Normal in size without focal abnormality. Adrenals/Urinary Tract: Bilateral renal cortical thinning and atrophy consistent with end-stage renal disease. No urinary tract calculi or obstructive uropathy. The bladder is decompressed, limiting its evaluation. The adrenals are unremarkable. Stomach/Bowel: No bowel  obstruction or ileus. The appendix is surgically absent. Interposed between the lateral margin inferior edge of the liver and the right lateral abdominal wall is a short small bowel segment with surrounding free fluid and mesenteric stranding, consistent with focal enteritis. This is best seen on image 32/3. Vascular/Lymphatic: Aortic atherosclerosis. No enlarged abdominal or pelvic lymph nodes. Reproductive: Uterus and bilateral adnexa are unremarkable. Other: Trace free fluid along the inferior margin of the liver adjacent to the presumed inflamed small bowel segment described above. No free intraperitoneal gas. No abdominal wall hernia. Interval removal of the peritoneal dialysis catheter seen previously. Musculoskeletal: No acute or destructive bony lesions. Reconstructed images demonstrate no additional findings. IMPRESSION: 1. Short segment of small bowel inflammation interposed between the lateral margin of the liver and lateral abdominal wall as described above, consistent with acute enteritis or focal small-bowel diverticulitis. No evidence of perforation, fluid collection, or abscess. 2. Small bilateral pleural effusions with dependent atelectasis. 3. 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 Please refer to previous MRI 12/14/2021 for follow-up recommendations. 4.  Aortic Atherosclerosis (ICD10-I70.0). Electronically Signed   By: Randa Ngo M.D.   On: 02/28/2022 17:11   DG Chest 2 View  Result Date: 02/28/2022 CLINICAL DATA:  Shortness of breath with epigastric pain since last night. Missed dialysis today. EXAM: CHEST - 2 VIEW COMPARISON:  Radiographs 09/30/2021 and 08/28/2021. FINDINGS: Right IJ hemodialysis catheter extends to the superior cavoatrial junction. The heart size and mediastinal contours are stable with mild aortic atherosclerosis. Interstitial prominence has mildly progressed from 01/16/2021 and could reflect mild edema. There are possible small bilateral pleural effusions. No confluent airspace  opacity or pneumothorax. The aeration of the lungs has improved compared with the most recent prior study. No acute osseous findings. IMPRESSION: 1. Although the aeration of the lung bases has improved compared with the most recent prior studies, there is increased interstitial prominence which could reflect mild edema and possible small bilateral pleural effusions. 2. No confluent airspace opacity. Electronically Signed   By: Richardean Sale M.D.   On: 02/28/2022 12:50    Microbiology: Results for orders placed or performed during the hospital encounter of 02/28/22  SARS Coronavirus 2 by RT PCR (hospital order, performed in [facility] hospital lab) *cepheid single result test* Anterior Nasal Swab     Status: None   Collection Time: 02/28/22 11:54 PM   Specimen: Anterior Nasal Swab  Result Value Ref Range Status   SARS Coronavirus 2 by RT PCR NEGATIVE NEGATIVE Final    Comment: (NOTE) SARS-CoV-2 target nucleic acids are NOT DETECTED.  The SARS-CoV-2 RNA is generally detectable in upper and lower respiratory specimens during the acute phase of infection. The lowest concentration of SARS-CoV-2 viral copies this assay can detect is 250 copies / mL. A negative result does not preclude SARS-CoV-2 infection and should not be used as the sole basis for treatment or other patient management decisions.  A negative result may occur with improper  specimen collection / handling, submission of specimen other than nasopharyngeal swab, presence of viral mutation(s) within the areas targeted by this assay, and inadequate number of viral copies (<250 copies / mL). A negative result must be combined with clinical observations, patient history, and epidemiological information.  Fact Sheet for Patients:   https://www.patel.info/  Fact Sheet for Healthcare Providers: https://hall.com/  This test is not yet approved or  cleared by the [place] FDA and has been  authorized for detection and/or diagnosis of SARS-CoV-2 by FDA under an Emergency Use Authorization (EUA).  This EUA will remain in effect (meaning this test can be used) for the duration of the COVID-19 declaration under Section 564(b)(1) of the Act, 21 U.S.C. section 360bbb-3(b)(1), unless the authorization is terminated or revoked sooner.  Performed at [facility], [place] [address] [place], [place] [address]   MRSA Next Gen by PCR, Nasal     Status: None   Collection Time: 03/01/22  2:55 AM   Specimen: Nasal Mucosa; Nasal Swab  Result Value Ref Range Status   MRSA by PCR Next Gen NOT DETECTED NOT DETECTED Final    Comment: (NOTE) The GeneXpert MRSA Assay (FDA approved for NASAL specimens only), is one component of a comprehensive MRSA colonization surveillance program. It is not intended to diagnose MRSA infection nor to guide or monitor treatment for MRSA infections. Test performance is not FDA approved in patients less than 38 years old. Performed at [facility], [place] [address], [place], [place] [address]     Labs: CBC: Recent Labs  Lab 02/28/22 1237  WBC 8.2  NEUTROABS 7.0  HGB 12.6  HCT 40.4  MCV 89.0  PLT 628   Basic Metabolic Panel: Recent Labs  Lab 02/28/22 1237 02/28/22 1720 03/01/22 0354  NA 138  --  136  K 6.1*  --  5.3*  CL 103  --  102  CO2 22  --  25  GLUCOSE 82  --  87  BUN 29*  --  35*  CREATININE 7.54*  --  8.82*  CALCIUM 9.3  --  8.6*  PHOS  --  4.8*  --    Liver Function Tests: Recent Labs  Lab 02/28/22 1510  AST 21  ALT 13  ALKPHOS 83  BILITOT 0.9  PROT 6.7  ALBUMIN 3.0*   CBG: No results for input(s): "GLUCAP" in the last 168 hours.  Discharge time spent: less than 30 minutes.  Signed: Marylu Lund, MD [facility] 03/01/2022

## 2022-03-01 NOTE — Progress Notes (Signed)
Initial Nutrition Assessment  DOCUMENTATION CODES:   Non-severe (moderate) malnutrition in context of chronic illness  INTERVENTION:   Renal Multivitamin w/ minerals daily Ensure Enlive po BID, each supplement provides 350 kcal and 20 grams of protein. 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  GOAL:   Patient will meet greater than or equal to 90% of their needs  MONITOR:   PO intake, Supplement acceptance, Labs, Weight trends, I & O's  REASON FOR ASSESSMENT:   Malnutrition Screening Tool    ASSESSMENT:   81 y.o. female presented to the ED with abdominal pain, diarrhea, and increased SOB. PMH includes ESRD on HD, HTN, gout, GERD, and gastroparesis.Pt admitted with hyperkalemia and acute enteritis.   Pt reports that her appetite was well until she became ill on Tuesday and started throwing up. Reports that on HD days she typically has 2 meals/day and on non-HD days she has 2-3 meals/day. States that she typically has [employer] cereal and bacon for breakfast, a protein bar at dialysis, and did not provide her usual dinner.   Pt endorses weight loss since transitioning to HD from PD. Reports that her UBW was around 120-130#; pt acknowledges that some was fluid but knows she has lost muscle too. Pt currently below her EDW.   Discussed the use of supplements due to missing meals and ongoing weight loss. Pt reports that some people want her to use them, then there are some that do not want her to use them. Reviewed the importance of proper nutrition and prevention of further weight loss.   Medications reviewed and include: [facility] Labs reviewed: Potassium 5.3, Phosphorus 4.8   HD on 8/9  EDW: 49 kg Net UF: 3500 mL  NUTRITION - FOCUSED PHYSICAL EXAM:  Flowsheet Row Most Recent Value  Orbital Region  Mild depletion  Upper Arm Region Moderate depletion  Thoracic and Lumbar Region Mild depletion  Buccal Region Mild depletion  Temple Region No depletion  Clavicle Bone Region Moderate depletion  Clavicle and Acromion Bone Region Moderate depletion  Scapular Bone Region Moderate depletion  Dorsal Hand Moderate depletion  Patellar Region Severe depletion  Anterior Thigh Region Severe depletion  Posterior Calf Region Severe depletion  Edema (RD Assessment) None  Hair Reviewed  Eyes Reviewed  Mouth Reviewed  Skin Reviewed  Nails Reviewed   Diet Order:   Diet Order             Diet renal with fluid restriction Fluid restriction: 1200 mL Fluid; Room service appropriate? Yes; Fluid consistency: Thin  Diet effective now                   EDUCATION NEEDS:   No education needs have been identified at this time  Skin:  Skin Assessment: Reviewed RN Assessment  Last BM:  8/9  Height:   Ht Readings from Last 1 Encounters:  03/01/22 4\' 11"  (1.499 m)    Weight:   Wt Readings from Last 1 Encounters:  03/01/22 45.7 kg    BMI:  Body mass index is 20.35 kg/m.  Estimated Nutritional Needs:   Kcal:  1500-1700  Protein:  75-90 grams  Fluid:  UOP + 1 L    Hermina Barters RD, LDN Clinical Dietitian See [facility] for contact information.

## 2022-03-01 NOTE — Progress Notes (Addendum)
[facility] Progress Note  Subjective: seen in room, no HD issues. No cramping. Down to 45kg post HD. SOB resolved, wants O2 off.   Vitals:   03/01/22 0600 03/01/22 0630 03/01/22 0700 03/01/22 0730  BP: (!) 142/70 (!) 158/67 (!) 142/64 129/84  Pulse: 70 68 67 68  Resp: 19 19 (!) 22 15  Temp:      TempSrc:      SpO2: 98% 98% 98% 98%  Weight:      Height:        Exam: Gen alert, no distress No jvd or bruits Chest clear bilat to bases RRR no MRG Abd soft ntnd no mass or ascites +bs Ext no edema Neuro is alert, Ox 3 , nf    RIJ TDC intact    Home meds include - breztri aerosphere 2 bid, carvedilol 12.5 bid, diazepam qd prn, famotidine, hydralazine 50 bid, renavite, trazodone, prns/ vits/ supps    OP HD: MWF [name] 3.5h   49kg  RIJ TDC  400/500  3K/2.5 bath  Heparin 1000 + 1000u/hr - mircera 150 q2, last given 8/7 - IV fe 50mg  /wk - post HD wt on Monday was 49.9kg - outpt BP's have been worse lately    Na 138  K 6.1  Co2 22 BUN 29  Creat 7.5  Ca 9.3  alb 3.0  BNP 3200    CXR possible mild IS edema new from last cxr   WBC 8k Hb 12    EKG NSR QRS 84 msec, no acute changes     Assessment/ Plan: SOB - likely vol overload related to losing lean body weight in ESRD pt. CXR w/ mild IS edema. Had HD overnight w/ 3.5 L off, no sig BP drops and no cramping. Post HD wt is 4kg under her dry wt, which means she has lost body wt. Vol excess appears resolved. Will repeat stand wt and wean off [place] O2 this am, if stable she will be ready for dc.  ESRD - on HD MWF in [place], [place]. HD here overnight/  early this am.  Hyperkalemia - mild w/ K+ 6.1, got IV Ca/ insulin/ glucose in ED. Repeat done prior to early am HD this morning was down to 5.3. She subsequently had dialysis so need for repeat labs any further.  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 Next dose due 8/21.  MBd ckd - CCa and phos in range. Not sure  is on binder.  Dispo - pend         Kelly Splinter 03/01/2022, 7:53 AM   Recent Labs  Lab 02/28/22 1237 02/28/22 1510 02/28/22 1720 03/01/22 0354  HGB 12.6  --   --   --   ALBUMIN  --  3.0*  --   --   CALCIUM 9.3  --   --  8.6*  PHOS  --   --  4.8*  --   CREATININE 7.54*  --   --  8.82*  K 6.1*  --   --  5.3*   No results for input(s): "IRON", "TIBC", "FERRITIN" in the last 168 hours. Inpatient medications:  acidophilus  2 capsule Oral TID   carvedilol  12.5 mg Oral BID   Chlorhexidine Gluconate Cloth  6 each Topical Q0600   heparin  5,000 Units Subcutaneous Q12H   hydrALAZINE  50 mg Oral BID   ketoconazole  1 Application Topical Daily   sodium chloride flush  3 mL Intravenous Q12H    sodium chloride     sodium chloride, acetaminophen, camphor-menthol, heparin, hydrALAZINE, HYDROcodone-acetaminophen, loperamide, ondansetron (ZOFRAN) IV, ondansetron (ZOFRAN) IV, mouth rinse, sodium chloride flush, traZODone

## 2022-03-01 NOTE — TOC Transition Note (Signed)
Transition of Care [facility]) - CM/SW Discharge Note   Patient Details  Name: Felicia Williams MRN: [id number] Date of Birth: December 26, 1940  Transition of Care [facility]) CM/SW Contact:  Tom-Johnson, Renea Ee, RN Phone Number: 03/01/2022, 4:14 PM   Clinical Narrative:     Patient is scheduled for discharge today. No TOC needs or recommendations noted. Family to transport at discharge. No further TOC needs.  Final next level of care: Home/Self Care Barriers to Discharge: Barriers Resolved   Patient Goals and CMS Choice Patient states their goals for this hospitalization and ongoing recovery are:: To return home CMS Medicare.gov Compare Post Acute Care list provided to:: Patient Choice offered to / list presented to : NA  Discharge Placement                Patient to be transferred to facility by: Family      Discharge Plan and Services                DME Arranged: N/A DME Agency: NA       HH Arranged: NA HH Agency: NA        Social Determinants of Health (SDOH) Interventions     Readmission Risk Interventions     No data to display

## 2022-03-01 NOTE — Progress Notes (Signed)
DISCHARGE NOTE HOME Felicia Williams to be discharged Home per MD order. Discussed prescriptions and follow up appointments with the patient. Prescriptions given to patient; medication list explained in detail. Patient verbalized understanding.  Skin clean, dry and intact without evidence of skin break down, no evidence of skin tears noted. IV catheter discontinued intact. Site without signs and symptoms of complications. Dressing and pressure applied. Pt denies pain at the site currently. No complaints noted.  Patient free of lines, drains, and wounds.   An After Visit Summary (AVS) was printed and given to the patient. Patient escorted via wheelchair, and discharged home via private auto.  Arlyss Repress, RN

## 2022-03-01 NOTE — Progress Notes (Signed)
Pt did not have a BM during hospital admission therefore staff was unable to collect stool sample.

## 2022-03-01 NOTE — Progress Notes (Signed)
SATURATION QUALIFICATIONS: (This note is used to comply with regulatory documentation for home oxygen) ? ?Patient Saturations on Room Air at Rest = 94% ? ?Patient Saturations on Room Air while Ambulating = 91% ? ?Patient Saturations on 0 Liters of oxygen while Ambulating = n/a% ? ?Please briefly explain why patient needs home oxygen: ?

## 2022-03-01 NOTE — TOC Progression Note (Signed)
Transition of Care [facility]) - Initial/Assessment Note    Patient Details  Name: Felicia Williams MRN: [id number] Date of Birth: 1941/02/25  Transition of Care [facility]) CM/SW Contact:    Milinda Antis, [place] Phone Number: 03/01/2022, 10:49 AM  Clinical Narrative:                 Patient from home.  Loura Back on MWF @ [employer].  Should SNF or HH be needed, PT/OT orders, assessments, and recommendations will be needed.  TOC following patient for any d/c planning needs once medically stable.  Lind Covert, MSW, LCSWA         Patient Goals and CMS Choice        Expected Discharge Plan and Services                                                Prior Living Arrangements/Services                       Activities of Daily Living Home Assistive Devices/Equipment: Bedside commode/3-in-1, Blood pressure cuff, Eyeglasses, Wheelchair, Environmental consultant (specify type), Scales, Shower chair with back, Hand-held shower hose ADL Screening (condition at time of admission) Patient's cognitive ability adequate to safely complete daily activities?: Yes Is the patient deaf or have difficulty hearing?: No Does the patient have difficulty seeing, even when wearing glasses/contacts?: No Does the patient have difficulty concentrating, remembering, or making decisions?: Yes Patient able to express need for assistance with ADLs?: Yes Does the patient have difficulty dressing or bathing?: No Independently performs ADLs?: Yes (appropriate for developmental age) Does the patient have difficulty walking or climbing stairs?: Yes Weakness of Legs: Both Weakness of Arms/Hands: None  Permission Sought/Granted                  Emotional Assessment              Admission diagnosis:  Shortness of breath [R06.02] Hyperkalemia [E87.5] Epigastric pain [R10.13] Hypervolemia, unspecified hypervolemia type [E87.70] Patient Active Problem List   Diagnosis Date Noted    Hyperkalemia 02/28/2022   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   Anorexia 11/28/2021   Weight loss 11/28/2021   Toxic encephalopathy 09/11/2021   Malnutrition of moderate degree 09/10/2021   Generalized weakness 09/07/2021   Debility 09/07/2021   Diarrhea 09/06/2021   Sepsis due to peritonitis associated with peritoneal dialysis ([place]) 08/30/2021   Pruritus 08/28/2021   ESRD on dialysis ([place]) 08/26/2021   Age-related osteoporosis without current pathological fracture 08/26/2021   Body mass index (BMI) 22.0-22.9, adult 08/26/2021   Cramp in limb 08/26/2021   Epistaxis 08/26/2021   Iron deficiency anemia 08/26/2021   Localized, 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   Overactive bladder 08/26/2021   Restless legs syndrome 08/26/2021   Sciatica 08/26/2021   Stage 3 chronic kidney disease ([place]) 08/26/2021   Vitamin D deficiency 08/26/2021   Panic disorder 08/26/2021   Gastroesophageal reflux disease 08/26/2021   Sepsis ([place]) 08/26/2021   Gastroparesis 05/23/2021   Chronic cough 05/23/2021   GERD (gastroesophageal reflux disease) 11/14/2020   Renal failure 01/15/2019   ARF (acute renal failure) ([place]) 01/07/2019   Glomerulonephritis 01/07/2019   Anxiety and depression 01/07/2019   Psoriasis 01/07/2019   Tachycardia, unspecified 01/07/2019   Macrocytic anemia 08/18/2018   CKD (chronic kidney disease) stage 4, GFR 15-29 ml/min ([place])  11/15/2017   Proteinuria 09/04/2017   Essential hypertension, benign 01/26/2014   Palpitations 01/26/2014   Fatigue due to depression 01/26/2014   Gout 07/30/2012   Mixed hyperlipidemia 10/01/2011   Depression 10/01/2011   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

## 2022-03-02 NOTE — Progress Notes (Signed)
Late Note Entry  D/C summary faxed to [name] this morning for continuation of care.   Taelor Waymire Renal Navigator [phone] 

## 2022-03-14 ENCOUNTER — Ambulatory Visit (INDEPENDENT_AMBULATORY_CARE_PROVIDER_SITE_OTHER): Payer: Medicare Other | Admitting: Vascular Surgery

## 2022-03-14 ENCOUNTER — Other Ambulatory Visit: Payer: Self-pay

## 2022-03-14 ENCOUNTER — Encounter: Payer: Self-pay | Admitting: Vascular Surgery

## 2022-03-14 VITALS — BP 181/76 | HR 65 | Temp 97.5°F | Ht 59.0 in | Wt 104.4 lb

## 2022-03-14 DIAGNOSIS — N186 End stage renal disease: Secondary | ICD-10-CM

## 2022-03-14 NOTE — H&P (View-Only) (Signed)
Vascular and Vein Specialist of [place]  Patient name: Felicia Williams MRN: [id number] DOB: Aug 27, 1940 Sex: female  REASON FOR VISIT: Discuss access options for hemodialysis  HPI: Felicia Williams is a 81 y.o. female here today for discussion of access options for hemodialysis.  She reports that she initially was on hemodialysis via a right IJ catheter for short period of time prior to initiating peritoneal dialysis.  She reports that she had initial peritoneal dialysis catheter placed to [facility] but never had function of this.  She then had a second catheter placed in [facility] and had successful use of this for approximately 1 year but then developed peritonitis secondary to the catheter.  She underwent catheter removal and placement of a tunneled IJ hemodialysis catheter with Dr. Donzetta Matters on 09/30/2021 [facility]. She has had difficulty with functioning of her catheter and has had this replaced and is still having difficulty with her current catheter.  She has never had arm access.  She is right-handed.  She reports that she had an ultrasound 4 years ago stating that she had no fistula option. Past Medical History:  Diagnosis Date   Anemia in ESRD (end-stage renal disease) ([place]) 08/18/2018   Coccyx contusion--with chronic pain due to fall    Depression    Dyspnea    ESRD on hemodialysis [facility])    Essential hypertension, benign    Gastric polyps    Gastroparesis    followed by Dr. Laural Golden.   GERD (gastroesophageal reflux disease)    Glomerulonephritis    Gout    Mixed hyperlipidemia    Spondylosis     Family History  Problem Relation Age of Onset   CAD Father    Heart attack Father    Diabetes Mellitus II Father    Hypertension Father    Lupus Brother     SOCIAL HISTORY: Social History   Tobacco Use   Smoking status: Never    Passive exposure: Never   Smokeless tobacco: Never  Substance Use Topics   Alcohol use: No     Allergies  Allergen Reactions   Cefepime Other (See Comments)    Feb 2023 Encephalopathy with questionable seizures. Seems to be tolerating cefazolin    Morphine     "Dry heaving like crazy"   Sulfa Antibiotics Other (See Comments)    Shut pt's kidneys down    Current Outpatient Medications  Medication Sig Dispense Refill   acetaminophen (TYLENOL) 325 MG tablet Take 2 tablets (650 mg total) by mouth every 6 (six) hours as needed for mild pain.     camphor-menthol (SARNA) lotion Apply topically as needed for itching. 222 mL 0   carvedilol (COREG) 12.5 MG tablet Take 1 tablet by mouth 2 (two) times daily.     diazepam (VALIUM) 5 MG tablet Take 5 mg by mouth once as needed for anxiety (going to spine doctor).     hydrALAZINE (APRESOLINE) 50 MG tablet Take 50 mg by mouth 2 (two) times daily.     HYDROcodone-acetaminophen (NORCO) 10-325 MG tablet Take 1 tablet by mouth 2 (two) times daily as needed for moderate pain.     ketoconazole (NIZORAL) 2 % cream Apply 1 Application topically daily.     loperamide (IMODIUM) 2 MG capsule Take 1 capsule (2 mg total) by mouth as needed for diarrhea or loose stools. 30 capsule 0   multivitamin (RENA-VIT) TABS tablet Take 1 tablet by mouth at bedtime. 30 tablet 0   traZODone (DESYREL) 100  MG tablet Take 100 mg by mouth at bedtime as needed for sleep.     lactobacillus acidophilus (BACID) TABS tablet Take 2 tablets by mouth 3 (three) times daily for 14 days. (Patient not taking: Reported on 03/14/2022) 84 tablet 0   famotidine (PEPCID) 40 MG tablet Take 1 tablet (40 mg total) by mouth at bedtime. (Patient not taking: Reported on 02/28/2022) 30 tablet 1   No current facility-administered medications for this visit.    REVIEW OF SYSTEMS:  [X]  denotes positive finding, [ ]  denotes negative finding Cardiac  Comments:  Chest pain or chest pressure:    Shortness of breath upon exertion:    Short of breath when lying flat:    Irregular heart rhythm:         Vascular    Pain in calf, thigh, or hip brought on by ambulation:    Pain in feet at night that wakes you up from your sleep:     Blood clot in your veins:    Leg swelling:           PHYSICAL EXAM: Vitals:   03/14/22 0944 03/14/22 0955  BP: (!) 203/84 (!) 181/76  Pulse: 65   Temp: (!) 97.5 F (36.4 C)   SpO2: 97%   Weight: 104 lb 6.4 oz (47.4 kg)   Height: 4\' 11"  (1.499 m)     GENERAL: The patient is a well-nourished female, in no acute distress. The vital signs are documented above. CARDIOVASCULAR: 2+ radial pulses bilaterally.  Very small surface veins bilaterally PULMONARY: There is good air exchange  MUSCULOSKELETAL: There are no major deformities or cyanosis. NEUROLOGIC: No focal weakness or paresthesias are detected. SKIN: There are no ulcers or rashes noted. PSYCHIATRIC: The patient has a normal affect.  DATA:  I did image her arm veins with SonoSite ultrasound.  She has extremely small cephalic and basilic veins bilaterally.  MEDICAL ISSUES: I had long discussion regarding options for hemodialysis with the patient.  She is continue to have difficulty with her IJ catheter.  I did explain the desire to remove catheter as soon as possible even with good function due to risk for infection.  She is clearly not a candidate for fistula creation.  I feel that her best first option would be left upper arm AV Gore-Tex graft placement.  She is not on anticoagulant and does not have a pacemaker.  I discussed the there are certain eventual thrombosis of her graft and subsequent treatment required for graft maintenance.  We will plan for outpatient left upper arm AV graft placement at [facility] on 03/20/2022.  She will have 3 more sessions of hemodialysis prior to this.  I can replace a new tunneled catheter if she continues to have difficulty with her current catheter.    Rosetta Posner, MD FACS [facility]ascular and Vein Specialists of [facility] [phone]  Note:  Portions of this report may have been transcribed using voice recognition software.  Every effort has been made to ensure accuracy; however, inadvertent computerized transcription errors may still be present.

## 2022-03-14 NOTE — Progress Notes (Signed)
Vascular and Vein Specialist of [place]  Patient name: Felicia Williams MRN: [id number] DOB: 1941/06/10 Sex: female  REASON FOR VISIT: Discuss access options for hemodialysis  HPI: Felicia Williams is a 81 y.o. female here today for discussion of access options for hemodialysis.  She reports that she initially was on hemodialysis via a right IJ catheter for short period of time prior to initiating peritoneal dialysis.  She reports that she had initial peritoneal dialysis catheter placed to [facility] but never had function of this.  She then had a second catheter placed in [facility] and had successful use of this for approximately 1 year but then developed peritonitis secondary to the catheter.  She underwent catheter removal and placement of a tunneled IJ hemodialysis catheter with Dr. Donzetta Matters on 09/30/2021 [facility]. She has had difficulty with functioning of her catheter and has had this replaced and is still having difficulty with her current catheter.  She has never had arm access.  She is right-handed.  She reports that she had an ultrasound 4 years ago stating that she had no fistula option. Past Medical History:  Diagnosis Date   Anemia in ESRD (end-stage renal disease) ([place]) 08/18/2018   Coccyx contusion--with chronic pain due to fall    Depression    Dyspnea    ESRD on hemodialysis [facility])    Essential hypertension, benign    Gastric polyps    Gastroparesis    followed by Dr. Laural Golden.   GERD (gastroesophageal reflux disease)    Glomerulonephritis    Gout    Mixed hyperlipidemia    Spondylosis     Family History  Problem Relation Age of Onset   CAD Father    Heart attack Father    Diabetes Mellitus II Father    Hypertension Father    Lupus Brother     SOCIAL HISTORY: Social History   Tobacco Use   Smoking status: Never    Passive exposure: Never   Smokeless tobacco: Never  Substance Use Topics   Alcohol use: No     Allergies  Allergen Reactions   Cefepime Other (See Comments)    Feb 2023 Encephalopathy with questionable seizures. Seems to be tolerating cefazolin    Morphine     "Dry heaving like crazy"   Sulfa Antibiotics Other (See Comments)    Shut pt's kidneys down    Current Outpatient Medications  Medication Sig Dispense Refill   acetaminophen (TYLENOL) 325 MG tablet Take 2 tablets (650 mg total) by mouth every 6 (six) hours as needed for mild pain.     camphor-menthol (SARNA) lotion Apply topically as needed for itching. 222 mL 0   carvedilol (COREG) 12.5 MG tablet Take 1 tablet by mouth 2 (two) times daily.     diazepam (VALIUM) 5 MG tablet Take 5 mg by mouth once as needed for anxiety (going to spine doctor).     hydrALAZINE (APRESOLINE) 50 MG tablet Take 50 mg by mouth 2 (two) times daily.     HYDROcodone-acetaminophen (NORCO) 10-325 MG tablet Take 1 tablet by mouth 2 (two) times daily as needed for moderate pain.     ketoconazole (NIZORAL) 2 % cream Apply 1 Application topically daily.     loperamide (IMODIUM) 2 MG capsule Take 1 capsule (2 mg total) by mouth as needed for diarrhea or loose stools. 30 capsule 0   multivitamin (RENA-VIT) TABS tablet Take 1 tablet by mouth at bedtime. 30 tablet 0   traZODone (DESYREL) 100  MG tablet Take 100 mg by mouth at bedtime as needed for sleep.     lactobacillus acidophilus (BACID) TABS tablet Take 2 tablets by mouth 3 (three) times daily for 14 days. (Patient not taking: Reported on 03/14/2022) 84 tablet 0   famotidine (PEPCID) 40 MG tablet Take 1 tablet (40 mg total) by mouth at bedtime. (Patient not taking: Reported on 02/28/2022) 30 tablet 1   No current facility-administered medications for this visit.    REVIEW OF SYSTEMS:  [X]  denotes positive finding, [ ]  denotes negative finding Cardiac  Comments:  Chest pain or chest pressure:    Shortness of breath upon exertion:    Short of breath when lying flat:    Irregular heart rhythm:         Vascular    Pain in calf, thigh, or hip brought on by ambulation:    Pain in feet at night that wakes you up from your sleep:     Blood clot in your veins:    Leg swelling:           PHYSICAL EXAM: Vitals:   03/14/22 0944 03/14/22 0955  BP: (!) 203/84 (!) 181/76  Pulse: 65   Temp: (!) 97.5 F (36.4 C)   SpO2: 97%   Weight: 104 lb 6.4 oz (47.4 kg)   Height: 4\' 11"  (1.499 m)     GENERAL: The patient is a well-nourished female, in no acute distress. The vital signs are documented above. CARDIOVASCULAR: 2+ radial pulses bilaterally.  Very small surface veins bilaterally PULMONARY: There is good air exchange  MUSCULOSKELETAL: There are no major deformities or cyanosis. NEUROLOGIC: No focal weakness or paresthesias are detected. SKIN: There are no ulcers or rashes noted. PSYCHIATRIC: The patient has a normal affect.  DATA:  I did image her arm veins with SonoSite ultrasound.  She has extremely small cephalic and basilic veins bilaterally.  MEDICAL ISSUES: I had long discussion regarding options for hemodialysis with the patient.  She is continue to have difficulty with her IJ catheter.  I did explain the desire to remove catheter as soon as possible even with good function due to risk for infection.  She is clearly not a candidate for fistula creation.  I feel that her best first option would be left upper arm AV Gore-Tex graft placement.  She is not on anticoagulant and does not have a pacemaker.  I discussed the there are certain eventual thrombosis of her graft and subsequent treatment required for graft maintenance.  We will plan for outpatient left upper arm AV graft placement at [facility] on 03/20/2022.  She will have 3 more sessions of hemodialysis prior to this.  I can replace a new tunneled catheter if she continues to have difficulty with her current catheter.    Rosetta Posner, MD FACS Vascular and Vein Specialists of [facility] [phone]  Note:  Portions of this report may have been transcribed using voice recognition software.  Every effort has been made to ensure accuracy; however, inadvertent computerized transcription errors may still be present.

## 2022-03-16 ENCOUNTER — Encounter (HOSPITAL_COMMUNITY)
Admission: RE | Admit: 2022-03-16 | Discharge: 2022-03-16 | Disposition: A | Discharge status: Home / Self Care | Payer: Medicare Other | Source: Ambulatory Visit | Attending: Vascular Surgery | Admitting: Vascular Surgery

## 2022-03-20 ENCOUNTER — Ambulatory Visit (HOSPITAL_COMMUNITY)
Admission: RE | Admit: 2022-03-20 | Discharge: 2022-03-20 | Disposition: A | Discharge status: Home / Self Care | Payer: Medicare Other | Attending: Vascular Surgery | Admitting: Vascular Surgery

## 2022-03-20 ENCOUNTER — Ambulatory Visit (HOSPITAL_COMMUNITY)
Admission: RE | Admit: 2022-03-20 | Discharge: 2022-03-20 | Disposition: A | Discharge status: Home / Self Care | Payer: Medicare Other | Source: Home / Self Care | Attending: Vascular Surgery | Admitting: Vascular Surgery

## 2022-03-20 ENCOUNTER — Other Ambulatory Visit: Payer: Self-pay

## 2022-03-20 ENCOUNTER — Ambulatory Visit (HOSPITAL_COMMUNITY): Payer: Medicare Other | Admitting: Certified Registered"

## 2022-03-20 ENCOUNTER — Encounter (HOSPITAL_COMMUNITY): Payer: Self-pay | Admitting: Vascular Surgery

## 2022-03-20 ENCOUNTER — Ambulatory Visit (HOSPITAL_BASED_OUTPATIENT_CLINIC_OR_DEPARTMENT_OTHER): Payer: Medicare Other | Admitting: Certified Registered"

## 2022-03-20 ENCOUNTER — Encounter (HOSPITAL_COMMUNITY)
Admission: RE | Disposition: A | Discharge status: Home / Self Care | Payer: Self-pay | Source: Home / Self Care | Attending: Vascular Surgery

## 2022-03-20 ENCOUNTER — Ambulatory Visit (HOSPITAL_COMMUNITY): Payer: Medicare Other

## 2022-03-20 DIAGNOSIS — F419 Anxiety disorder, unspecified: Secondary | ICD-10-CM | POA: Insufficient documentation

## 2022-03-20 DIAGNOSIS — N186 End stage renal disease: Secondary | ICD-10-CM | POA: Diagnosis not present

## 2022-03-20 DIAGNOSIS — Z992 Dependence on renal dialysis: Secondary | ICD-10-CM | POA: Diagnosis not present

## 2022-03-20 DIAGNOSIS — D631 Anemia in chronic kidney disease: Secondary | ICD-10-CM

## 2022-03-20 DIAGNOSIS — G709 Myoneural disorder, unspecified: Secondary | ICD-10-CM | POA: Diagnosis not present

## 2022-03-20 DIAGNOSIS — F32A Depression, unspecified: Secondary | ICD-10-CM | POA: Diagnosis not present

## 2022-03-20 DIAGNOSIS — N185 Chronic kidney disease, stage 5: Secondary | ICD-10-CM | POA: Diagnosis not present

## 2022-03-20 DIAGNOSIS — I120 Hypertensive chronic kidney disease with stage 5 chronic kidney disease or end stage renal disease: Secondary | ICD-10-CM | POA: Insufficient documentation

## 2022-03-20 DIAGNOSIS — N184 Chronic kidney disease, stage 4 (severe): Secondary | ICD-10-CM

## 2022-03-20 HISTORY — PX: AV FISTULA PLACEMENT: SHX1204

## 2022-03-20 HISTORY — PX: EXCHANGE OF A DIALYSIS CATHETER: SHX5818

## 2022-03-20 LAB — POCT I-STAT, CHEM 8
BUN: 57 mg/dL — ABNORMAL HIGH (ref 8–23)
Calcium, Ion: 1.13 mmol/L — ABNORMAL LOW (ref 1.15–1.40)
Chloride: 102 mmol/L (ref 98–111)
Creatinine, Ser: 9.4 mg/dL — ABNORMAL HIGH (ref 0.44–1.00)
Glucose, Bld: 82 mg/dL (ref 70–99)
HCT: 42 % (ref 36.0–46.0)
Hemoglobin: 14.3 g/dL (ref 12.0–15.0)
Potassium: 5.2 mmol/L — ABNORMAL HIGH (ref 3.5–5.1)
Sodium: 135 mmol/L (ref 135–145)
TCO2: 23 mmol/L (ref 22–32)

## 2022-03-20 SURGERY — INSERTION OF ARTERIOVENOUS (AV) GORE-TEX GRAFT ARM
Anesthesia: General | Site: Neck

## 2022-03-20 MED ORDER — 0.9 % SODIUM CHLORIDE (POUR BTL) OPTIME
TOPICAL | Status: DC | PRN
  Administered 2022-03-20: 1000 mL

## 2022-03-20 MED ORDER — LIDOCAINE HCL (CARDIAC) PF 100 MG/5ML IV SOSY
PREFILLED_SYRINGE | INTRAVENOUS | Status: DC | PRN
  Administered 2022-03-20: 40 mg via INTRAVENOUS

## 2022-03-20 MED ORDER — SODIUM CHLORIDE 0.9 % IV SOLN
INTRAVENOUS | Status: DC
  Administered 2022-03-20: 1000 mL via INTRAVENOUS

## 2022-03-20 MED ORDER — EPHEDRINE SULFATE-NACL 50-0.9 MG/10ML-% IV SOSY
PREFILLED_SYRINGE | INTRAVENOUS | Status: DC | PRN
  Administered 2022-03-20: 5 mg via INTRAVENOUS

## 2022-03-20 MED ORDER — VANCOMYCIN HCL IN DEXTROSE 1-5 GM/200ML-% IV SOLN
INTRAVENOUS | Status: AC
  Filled 2022-03-20: qty 200

## 2022-03-20 MED ORDER — LIDOCAINE HCL (PF) 2 % IJ SOLN
INTRAMUSCULAR | Status: AC
  Filled 2022-03-20: qty 5

## 2022-03-20 MED ORDER — CHLORHEXIDINE GLUCONATE 4 % EX LIQD: 60.0000 mL | Freq: Once | CUTANEOUS | Status: DC

## 2022-03-20 MED ORDER — ORAL CARE MOUTH RINSE
15.0000 mL | Freq: Once | OROMUCOSAL | Status: AC
Start: 2022-03-20 — End: 2022-03-20

## 2022-03-20 MED ORDER — LIDOCAINE-EPINEPHRINE 0.5 %-1:200000 IJ SOLN
INTRAMUSCULAR | Status: DC | PRN
  Administered 2022-03-20: 10 mL

## 2022-03-20 MED ORDER — PROPOFOL 500 MG/50ML IV EMUL
INTRAVENOUS | Status: AC
  Filled 2022-03-20: qty 50

## 2022-03-20 MED ORDER — FENTANYL CITRATE (PF) 100 MCG/2ML IJ SOLN
INTRAMUSCULAR | Status: DC | PRN
  Administered 2022-03-20 (×2): 25 ug via INTRAVENOUS

## 2022-03-20 MED ORDER — VANCOMYCIN HCL IN DEXTROSE 1-5 GM/200ML-% IV SOLN
1000.0000 mg | INTRAVENOUS | Status: AC
  Administered 2022-03-20: 1000 mg via INTRAVENOUS

## 2022-03-20 MED ORDER — OXYCODONE HCL 5 MG/5ML PO SOLN: 5.0000 mg | Freq: Once | ORAL | Status: DC | PRN

## 2022-03-20 MED ORDER — LIDOCAINE-EPINEPHRINE 0.5 %-1:200000 IJ SOLN
INTRAMUSCULAR | Status: AC
  Filled 2022-03-20: qty 1

## 2022-03-20 MED ORDER — CHLORHEXIDINE GLUCONATE 0.12 % MT SOLN
15.0000 mL | Freq: Once | OROMUCOSAL | Status: AC
  Administered 2022-03-20: 15 mL via OROMUCOSAL

## 2022-03-20 MED ORDER — LACTATED RINGERS IV SOLN: INTRAVENOUS | Status: DC

## 2022-03-20 MED ORDER — ONDANSETRON HCL 4 MG/2ML IJ SOLN
INTRAMUSCULAR | Status: DC | PRN
  Administered 2022-03-20: 4 mg via INTRAVENOUS

## 2022-03-20 MED ORDER — KETAMINE HCL 10 MG/ML IJ SOLN
INTRAMUSCULAR | Status: DC | PRN
  Administered 2022-03-20 (×3): 5 mg via INTRAVENOUS

## 2022-03-20 MED ORDER — HEPARIN SODIUM (PORCINE) 1000 UNIT/ML IJ SOLN
INTRAMUSCULAR | Status: AC
  Filled 2022-03-20: qty 1

## 2022-03-20 MED ORDER — FENTANYL CITRATE (PF) 100 MCG/2ML IJ SOLN
INTRAMUSCULAR | Status: AC
  Filled 2022-03-20: qty 2

## 2022-03-20 MED ORDER — OXYCODONE HCL 5 MG PO TABS: 5.0000 mg | ORAL_TABLET | Freq: Once | ORAL | Status: DC | PRN

## 2022-03-20 MED ORDER — HEPARIN SOD (PORK) LOCK FLUSH 100 UNIT/ML IV SOLN
INTRAVENOUS | Status: AC
  Filled 2022-03-20: qty 5

## 2022-03-20 MED ORDER — PROPOFOL 10 MG/ML IV BOLUS
INTRAVENOUS | Status: DC | PRN
  Administered 2022-03-20: 10 mg via INTRAVENOUS
  Administered 2022-03-20: 20 mg via INTRAVENOUS

## 2022-03-20 MED ORDER — EPHEDRINE 5 MG/ML INJ
INTRAVENOUS | Status: AC
  Filled 2022-03-20: qty 5

## 2022-03-20 MED ORDER — ONDANSETRON HCL 4 MG/2ML IJ SOLN
4.0000 mg | Freq: Once | INTRAMUSCULAR | Status: DC | PRN
Start: 2022-03-20 — End: 2022-03-20

## 2022-03-20 MED ORDER — FENTANYL CITRATE PF 50 MCG/ML IJ SOSY: 25.0000 ug | PREFILLED_SYRINGE | INTRAMUSCULAR | Status: DC | PRN

## 2022-03-20 MED ORDER — PROPOFOL 500 MG/50ML IV EMUL
INTRAVENOUS | Status: DC | PRN
  Administered 2022-03-20: 75 ug/kg/min via INTRAVENOUS

## 2022-03-20 MED ORDER — ONDANSETRON HCL 4 MG/2ML IJ SOLN
INTRAMUSCULAR | Status: AC
  Filled 2022-03-20: qty 2

## 2022-03-20 MED ORDER — HEPARIN 6000 UNIT IRRIGATION SOLUTION
Status: DC | PRN
  Administered 2022-03-20: 1

## 2022-03-20 MED ORDER — MIDAZOLAM HCL 2 MG/2ML IJ SOLN
INTRAMUSCULAR | Status: AC
  Filled 2022-03-20: qty 2

## 2022-03-20 MED ORDER — KETAMINE HCL 50 MG/5ML IJ SOSY
PREFILLED_SYRINGE | INTRAMUSCULAR | Status: AC
  Filled 2022-03-20: qty 5

## 2022-03-20 SURGICAL SUPPLY — 68 items
ADH SKN CLS APL DERMABOND .7 (GAUZE/BANDAGES/DRESSINGS) ×2
ARMBAND PINK RESTRICT EXTREMIT (MISCELLANEOUS) ×2 IMPLANT
BAG DECANTER FOR FLEXI CONT (MISCELLANEOUS) ×2 IMPLANT
BAG HAMPER (MISCELLANEOUS) ×2 IMPLANT
BIOPATCH RED 1 DISK 7.0 (GAUZE/BANDAGES/DRESSINGS) ×2 IMPLANT
CANNULA VESSEL 3MM 2 BLNT TIP (CANNULA) ×2 IMPLANT
CATH PALINDROME-P 19CM W/VT (CATHETERS) IMPLANT
CATH PALINDROME-P 23CM W/VT (CATHETERS) IMPLANT
CATH PALINDROME-P 28CM W/VT (CATHETERS) IMPLANT
CLIP LIGATING EXTRA MED SLVR (CLIP) ×2 IMPLANT
CLIP LIGATING EXTRA SM BLUE (MISCELLANEOUS) ×2 IMPLANT
COVER LIGHT HANDLE STERIS (MISCELLANEOUS) ×4 IMPLANT
COVER MAYO STAND XLG (MISCELLANEOUS) ×2 IMPLANT
COVER PROBE W GEL 5X96 (DRAPES) IMPLANT
COVER SURGICAL LIGHT HANDLE (MISCELLANEOUS) ×2 IMPLANT
DERMABOND ADVANCED (GAUZE/BANDAGES/DRESSINGS) ×2
DERMABOND ADVANCED .7 DNX12 (GAUZE/BANDAGES/DRESSINGS) ×2 IMPLANT
DRAPE C-ARM FOLDED MOBILE STRL (DRAPES) ×2 IMPLANT
DRAPE CHEST BREAST 15X10 FENES (DRAPES) ×2 IMPLANT
ELECT REM PT RETURN 9FT ADLT (ELECTROSURGICAL) ×2
ELECTRODE REM PT RTRN 9FT ADLT (ELECTROSURGICAL) ×2 IMPLANT
GAUZE SPONGE 4X4 12PLY STRL (GAUZE/BANDAGES/DRESSINGS) ×4 IMPLANT
GAUZE SPONGE 4X4 16PLY XRAY LF (GAUZE/BANDAGES/DRESSINGS) ×2 IMPLANT
GLOVE BIO SURGEON STRL SZ7 (GLOVE) IMPLANT
GLOVE BIOGEL PI IND STRL 7.0 (GLOVE) ×4 IMPLANT
GLOVE BIOGEL PI INDICATOR 7.0 (GLOVE) ×8
GLOVE SURG MICRO LTX SZ7.5 (GLOVE) ×2 IMPLANT
GOWN STRL REUS W/TWL LRG LVL3 (GOWN DISPOSABLE) ×6 IMPLANT
GRAFT GORETEX STRT 4-7X45 (Vascular Products) IMPLANT
IV NS 500ML (IV SOLUTION) ×4
IV NS 500ML BAXH (IV SOLUTION) ×4 IMPLANT
KIT BLADEGUARD II DBL (SET/KITS/TRAYS/PACK) ×2 IMPLANT
KIT PALINDROME-P 55CM (CATHETERS) IMPLANT
KIT TURNOVER KIT A (KITS) ×2 IMPLANT
MANIFOLD NEPTUNE II (INSTRUMENTS) ×2 IMPLANT
MARKER SKIN DUAL TIP RULER LAB (MISCELLANEOUS) ×4 IMPLANT
NDL 18GX1X1/2 (RX/OR ONLY) (NEEDLE) ×2 IMPLANT
NDL HYPO 18GX1.5 BLUNT FILL (NEEDLE) ×2 IMPLANT
NDL HYPO 25GX1X1/2 BEV (NEEDLE) ×2 IMPLANT
NEEDLE 18GX1X1/2 (RX/OR ONLY) (NEEDLE) ×2 IMPLANT
NEEDLE 22X1 1/2 (OR ONLY) (NEEDLE) ×2 IMPLANT
NEEDLE HYPO 18GX1.5 BLUNT FILL (NEEDLE) ×2 IMPLANT
NEEDLE HYPO 25GX1X1/2 BEV (NEEDLE) ×2 IMPLANT
NS IRRIG 1000ML POUR BTL (IV SOLUTION) ×2 IMPLANT
PACK CV ACCESS (CUSTOM PROCEDURE TRAY) ×2 IMPLANT
PACK SURGICAL SETUP 50X90 (CUSTOM PROCEDURE TRAY) ×2 IMPLANT
PAD ARMBOARD 7.5X6 YLW CONV (MISCELLANEOUS) ×4 IMPLANT
POSITIONER HEAD DONUT 9IN (MISCELLANEOUS) ×2 IMPLANT
SET BASIN LINEN APH (SET/KITS/TRAYS/PACK) ×2 IMPLANT
SOAP 2 % CHG 4 OZ (WOUND CARE) ×2 IMPLANT
SOL PREP POV-IOD 4OZ 10% (MISCELLANEOUS) ×2 IMPLANT
SOL PREP PROV IODINE SCRUB 4OZ (MISCELLANEOUS) ×2 IMPLANT
SPONGE T-LAP 18X18 ~~LOC~~+RFID (SPONGE) ×2 IMPLANT
SUT ETHILON 3 0 PS 1 (SUTURE) ×2 IMPLANT
SUT PROLENE 6 0 CC (SUTURE) ×2 IMPLANT
SUT SILK 2 0 FSL 18 (SUTURE) ×2 IMPLANT
SUT VIC AB 3-0 SH 27 (SUTURE) ×2
SUT VIC AB 3-0 SH 27X BRD (SUTURE) ×2 IMPLANT
SUT VICRYL 4-0 PS2 18IN ABS (SUTURE) ×2 IMPLANT
SYR 10ML LL (SYRINGE) ×2 IMPLANT
SYR 20ML LL LF (SYRINGE) ×2 IMPLANT
SYR 50ML LL SCALE MARK (SYRINGE) ×2 IMPLANT
SYR 5ML LL (SYRINGE) ×4 IMPLANT
SYR CONTROL 10ML LL (SYRINGE) ×2 IMPLANT
SYR TOOMEY 50ML (SYRINGE) IMPLANT
TOWEL GREEN STERILE (TOWEL DISPOSABLE) ×2 IMPLANT
UNDERPAD 30X36 HEAVY ABSORB (UNDERPADS AND DIAPERS) ×2 IMPLANT
WATER STERILE IRR 1000ML POUR (IV SOLUTION) ×2 IMPLANT

## 2022-03-20 NOTE — Op Note (Signed)
OPERATIVE REPORT  DATE OF SURGERY: 03/20/2022  PATIENT: Felicia Williams, 81 y.o. female MRN: [id number]  DOB: [birth year]  PRE-OPERATIVE DIAGNOSIS: Stage renal disease  POST-OPERATIVE DIAGNOSIS:  Same  PROCEDURE: #1 exchange of right IJ tunneled hemodialysis catheter, #2 new left upper arm AV Gore-Tex graft  SURGEON:  Curt Jews, M.D.  PHYSICIAN ASSISTANT: Fulton Mole, RNFA  The assistant was needed for exposure and to expedite the case  ANESTHESIA: MAC  EBL: per anesthesia record  Total I/O In: 200 [IV Piggyback:200] Out: 15 [Blood:15]  BLOOD ADMINISTERED: none  DRAINS: none  SPECIMEN: none  COUNTS CORRECT:  YES  PATIENT DISPOSITION:  PACU - hemodynamically stable  PROCEDURE DETAILS: The patient was taken the operating placed supine position.  Patient had a nonfunctioning right IJ tunnel catheter.  The area of the right and left neck and chest were prepped and draped in usual sterile fashion.  The left internal jugular vein was visualized and was widely patent.  C-arm was brought onto the field and the catheter was in good position.  Using local anesthesia incision was made over the entry site at the internal jugular vein at the base of the right neck.  The catheter was grasped with hemostat and was transected distally.  A guidewire was passed through the existing catheter and the central portion of the catheter was removed.  A 23 cm catheter was brought onto the field and a separate stab incision was made using local anesthesia.  The catheter was brought through the subcutaneous tunnel.  The dilator and peel-away sheath were passed over the guidewire and the dilator and guidewire removed.  The catheter was passed down the peel-away sheath and the peel-away sheath was then removed.  The catheter was positioned at the level of the distal right atrium.  Both lumens flushed and aspirated easily and were locked with 1000 unit/cc heparin.  The catheter was secured to the skin  with a 3-0 nylon stitch and the entry site was closed with a 4-0 subcuticular Vicryl stitch.  Sterile dressing was applied.  Attention was then turned to the left arm for long-term access.  The left arm and axilla were prepped and draped in the usual sterile fashion.  Incision was made over the antecubital space over the brachial pulse.  The brachial artery was of moderate size and had minimal atherosclerotic change.  Next a separate incision was made over the axilla and the veins were identified in the axilla.  The patient had several branches that then ended into a larger branch high in the axilla.  These distal branches were controlled with 2-0 silk ties.  A tunnel was created between the level of the antecubital space and the axillary incision.  A 4 x 7 mm Gore-Tex graft was brought through the tunnel.  The brachial artery was occluded proximally and distally opened with 11 blade and sent longitudinally with Potts scissors.  A small arteriotomy was created to reduce risk of steal.  The graft was spatulated at approximately the 5 mm level and was sewn end-to-side to the brachial artery with a running 6-0 Prolene suture.  This anastomosis was tested and found to be adequate.  The graft was flushed with heparinized saline and reoccluded.  The axillary vein was occluded distally with a baby Gregory clamp.  The vein was opened and extended longitudinally with Potts scissors.  The 7 mm portion of the graft was cut to the appropriate length and was spatulated and sewn end-to-side to the artery  with a running 6-0 Prolene suture.  Clamps were removed and excellent thrill was noted.  The wounds were irrigated with saline.  Hemostasis obtained after cautery.  The wounds were closed with 3-0 Vicryl in the subcutaneous and subcuticular tissue.  Dermabond was applied and the patient was transferred to the recovery room where chest x-ray is pending   Rosetta Posner, M.D., [facility] 03/20/2022 2:14 PM  Note: Portions of this  report may have been transcribed using voice recognition software.  Every effort has been made to ensure accuracy; however, inadvertent computerized transcription errors may still be present.

## 2022-03-20 NOTE — Anesthesia Preprocedure Evaluation (Signed)
Anesthesia Evaluation  Patient identified by MRN, date of birth, ID band Patient awake    Reviewed: Allergy & Precautions, H&P , NPO status , Patient's Chart, lab work & pertinent test results, reviewed documented beta blocker date and time   Airway Mallampati: II  TM Distance: >3 FB Neck ROM: full    Dental no notable dental hx.    Pulmonary shortness of breath,    Pulmonary exam normal breath sounds clear to auscultation       Cardiovascular Exercise Tolerance: Good hypertension, negative cardio ROS   Rhythm:regular Rate:Normal     Neuro/Psych PSYCHIATRIC DISORDERS Anxiety Depression  Neuromuscular disease    GI/Hepatic Neg liver ROS, GERD  Medicated,  Endo/Other  negative endocrine ROS  Renal/GU ESRF, Dialysis and CRFRenal disease  negative genitourinary   Musculoskeletal   Abdominal   Peds  Hematology  (+) Blood dyscrasia, anemia ,   Anesthesia Other Findings   Reproductive/Obstetrics negative OB ROS                             Anesthesia Physical Anesthesia Plan  ASA: 3  Anesthesia Plan: General   Post-op Pain Management:    Induction:   PONV Risk Score and Plan:   Airway Management Planned:   Additional Equipment:   Intra-op Plan:   Post-operative Plan:   Informed Consent: I have reviewed the patients History and Physical, chart, labs and discussed the procedure including the risks, benefits and alternatives for the proposed anesthesia with the patient or authorized representative who has indicated his/her understanding and acceptance.     Dental Advisory Given  Plan Discussed with: CRNA  Anesthesia Plan Comments:         Anesthesia Quick Evaluation

## 2022-03-20 NOTE — Discharge Instructions (Addendum)
[facility]  Discharge Instructions  AV Fistula or Graft Surgery for Dialysis Access  Please refer to the following instructions for your post-procedure care. Your surgeon or physician assistant will discuss any changes with you.  Activity  You may drive the day following your surgery, if you are comfortable and no longer taking prescription pain medication. Resume full activity as the soreness in your incision resolves.  Bathing/Showering  You may shower after you go home. Keep your incision dry for 48 hours. Do not soak in a bathtub, hot tub, or swim until the incision heals completely. You may not shower if you have a hemodialysis catheter.  Incision Care  Clean your incision with mild soap and water after 48 hours. Pat the area dry with a clean towel. You do not need a bandage unless otherwise instructed. Do not apply any ointments or creams to your incision. You may have skin glue on your incision. Do not peel it off. It will come off on its own in about one week. Your arm may swell a bit after surgery. To reduce swelling use pillows to elevate your arm so it is above your heart. Your doctor will tell you if you need to lightly wrap your arm with an ACE bandage.  Diet  Resume your normal diet. There are not special food restrictions following this procedure. In order to heal from your surgery, it is CRITICAL to get adequate nutrition. Your body requires vitamins, minerals, and protein. Vegetables are the best source of vitamins and minerals. Vegetables also provide the perfect balance of protein. Processed food has little nutritional value, so try to avoid this.  Medications  Resume taking all of your medications. If your incision is causing pain, you may take over-the counter pain relievers such as acetaminophen (Tylenol). If you were prescribed a stronger pain medication, please be aware these medications can cause nausea and constipation. Prevent  nausea by taking the medication with a snack or meal. Avoid constipation by drinking plenty of fluids and eating foods with high amount of fiber, such as fruits, vegetables, and grains.  Do not take Tylenol if you are taking prescription pain medications.  Follow up Your surgeon may want to see you in the office following your access surgery. If so, this will be arranged at the time of your surgery.  Please call us immediately for any of the following conditions:  Increased pain, redness, drainage (pus) from your incision site Fever of 101 degrees or higher Severe or worsening pain at your incision site Hand pain or numbness.  Reduce your risk of vascular disease:  Stop smoking. If you would like help, call [facility] at 1-800-QUIT-NOW (239) 354-7389) or [place] at [place] your cholesterol Maintain a desired weight Control your diabetes Keep your blood pressure down  Dialysis  It will take several weeks to several months for your new dialysis access to be ready for use. Your surgeon will determine when it is okay to use it. Your nephrologist will continue to direct your dialysis. You can continue to use your Permcath until your new access is ready for use.   03/20/2022 Felicia Williams [id number] [birth year]  Surgeon(s): Early, Arvilla Meres, MD  Procedure(s): INSERTION OF LEFT UPPER ARM ARTERIOVENOUS (AV) GORE-TEX GRAFT EXCHANGE OF A DIALYSIS CATHETER   May stick graft immediately   May stick graft on designated area only:    Do not stick graft for 4 weeks    If you have any  questions, please call the office at [phone].

## 2022-03-20 NOTE — Interval H&P Note (Signed)
History and Physical Interval Note:  03/20/2022 11:09 AM  Felicia Williams  has presented today for surgery, with the diagnosis of ESRD.  The various methods of treatment have been discussed with the patient and family. After consideration of risks, benefits and other options for treatment, the patient has consented to  Procedure(s): INSERTION OF LEFT UPPER ARM ARTERIOVENOUS (AV) GORE-TEX GRAFT (Left) POSSIBLE EXCHANGE OF A DIALYSIS CATHETER (N/A) as a surgical intervention.  The patient's history has been reviewed, patient examined, no change in status, stable for surgery.  I have reviewed the patient's chart and labs.  Questions were answered to the patient's satisfaction.     Curt Jews

## 2022-03-20 NOTE — Transfer of Care (Signed)
Immediate Anesthesia Transfer of Care Note  Patient: Felicia Williams  Procedure(s) Performed: INSERTION OF LEFT UPPER ARM ARTERIOVENOUS (AV) GORE-TEX GRAFT (Left: Arm Lower) EXCHANGE OF A DIALYSIS CATHETER (Neck)  Patient Location: PACU  Anesthesia Type:MAC  Level of Consciousness: awake, alert  and oriented  Airway & Oxygen Therapy: Patient Spontanous Breathing  Post-op Assessment: Report given to RN and Post -op Vital signs reviewed and stable  Post vital signs: Reviewed and stable  Last Vitals:  Vitals Value Taken Time  BP 149/60 03/20/22 1419  Temp    Pulse 64 03/20/22 1422  Resp 16 03/20/22 1422  SpO2 97 % 03/20/22 1422  Vitals shown include unvalidated device data.  Last Pain:  Vitals:   03/20/22 1002  TempSrc: Oral  PainSc: 3       Patients Stated Pain Goal: 8 (30/05/11 0211)  Complications: No notable events documented.

## 2022-03-22 NOTE — Anesthesia Postprocedure Evaluation (Signed)
Anesthesia Post Note  Patient: Felicia Williams  Procedure(s) Performed: INSERTION OF LEFT UPPER ARM ARTERIOVENOUS (AV) GORE-TEX GRAFT (Left: Arm Lower) EXCHANGE OF A DIALYSIS CATHETER (Neck)  Patient location during evaluation: Phase II Anesthesia Type: General Level of consciousness: awake Pain management: pain level controlled Vital Signs Assessment: post-procedure vital signs reviewed and stable Respiratory status: spontaneous breathing and respiratory function stable Cardiovascular status: blood pressure returned to baseline and stable Postop Assessment: no headache and no apparent nausea or vomiting Anesthetic complications: no Comments: Late entry   No notable events documented.   Last Vitals:  Vitals:   03/20/22 1430 03/20/22 1445  BP: (!) 140/65 (!) 167/72  Pulse: 65 62  Resp: 13 15  Temp:    SpO2: 97% 98%    Last Pain:  Vitals:   03/20/22 1002  TempSrc: Oral                 Louann Sjogren

## 2022-03-23 ENCOUNTER — Encounter (HOSPITAL_COMMUNITY): Payer: Self-pay | Admitting: Vascular Surgery

## 2022-03-27 ENCOUNTER — Telehealth: Payer: Self-pay

## 2022-03-27 NOTE — Telephone Encounter (Signed)
Pt called stating she just had a graft placed in her L arm and a catheter in her R chest. She noticed some numbness near the HD catheter on her chest and was concerned.  Reviewed pt's chart, returned call for clarification, two identifiers used. Pt states that she has had 3-4 different HD catheters placed there. Pt denies any other symptoms, catheter working well for HD treatments. Pt reassured and told to monitor for any pain or swelling. Confirmed understanding.

## 2022-04-22 ENCOUNTER — Other Ambulatory Visit: Payer: Self-pay

## 2022-04-22 ENCOUNTER — Encounter (HOSPITAL_COMMUNITY): Payer: Self-pay

## 2022-04-22 ENCOUNTER — Emergency Department (HOSPITAL_COMMUNITY): Payer: Medicare Other

## 2022-04-22 ENCOUNTER — Inpatient Hospital Stay (HOSPITAL_COMMUNITY)
Admission: EM | Admit: 2022-04-22 | Discharge: 2022-04-26 | DRG: 291 | Disposition: A | Discharge status: Home / Self Care | Payer: Medicare Other | Attending: Internal Medicine | Admitting: Internal Medicine

## 2022-04-22 DIAGNOSIS — J810 Acute pulmonary edema: Secondary | ICD-10-CM

## 2022-04-22 DIAGNOSIS — E875 Hyperkalemia: Secondary | ICD-10-CM | POA: Diagnosis present

## 2022-04-22 DIAGNOSIS — K3184 Gastroparesis: Secondary | ICD-10-CM | POA: Diagnosis present

## 2022-04-22 DIAGNOSIS — I132 Hypertensive heart and chronic kidney disease with heart failure and with stage 5 chronic kidney disease, or end stage renal disease: Principal | ICD-10-CM | POA: Diagnosis present

## 2022-04-22 DIAGNOSIS — R0902 Hypoxemia: Secondary | ICD-10-CM

## 2022-04-22 DIAGNOSIS — Z882 Allergy status to sulfonamides status: Secondary | ICD-10-CM

## 2022-04-22 DIAGNOSIS — F419 Anxiety disorder, unspecified: Secondary | ICD-10-CM | POA: Diagnosis present

## 2022-04-22 DIAGNOSIS — Z885 Allergy status to narcotic agent status: Secondary | ICD-10-CM

## 2022-04-22 DIAGNOSIS — R34 Anuria and oliguria: Secondary | ICD-10-CM | POA: Diagnosis present

## 2022-04-22 DIAGNOSIS — Z8249 Family history of ischemic heart disease and other diseases of the circulatory system: Secondary | ICD-10-CM

## 2022-04-22 DIAGNOSIS — N186 End stage renal disease: Secondary | ICD-10-CM | POA: Diagnosis not present

## 2022-04-22 DIAGNOSIS — E8770 Fluid overload, unspecified: Secondary | ICD-10-CM | POA: Diagnosis present

## 2022-04-22 DIAGNOSIS — G2581 Restless legs syndrome: Secondary | ICD-10-CM | POA: Diagnosis present

## 2022-04-22 DIAGNOSIS — R531 Weakness: Secondary | ICD-10-CM

## 2022-04-22 DIAGNOSIS — E8779 Other fluid overload: Secondary | ICD-10-CM

## 2022-04-22 DIAGNOSIS — I5031 Acute diastolic (congestive) heart failure: Secondary | ICD-10-CM | POA: Diagnosis not present

## 2022-04-22 DIAGNOSIS — I2489 Other forms of acute ischemic heart disease: Secondary | ICD-10-CM | POA: Diagnosis present

## 2022-04-22 DIAGNOSIS — D631 Anemia in chronic kidney disease: Secondary | ICD-10-CM | POA: Diagnosis present

## 2022-04-22 DIAGNOSIS — Z992 Dependence on renal dialysis: Secondary | ICD-10-CM

## 2022-04-22 DIAGNOSIS — N2581 Secondary hyperparathyroidism of renal origin: Secondary | ICD-10-CM | POA: Diagnosis present

## 2022-04-22 DIAGNOSIS — R0600 Dyspnea, unspecified: Secondary | ICD-10-CM

## 2022-04-22 DIAGNOSIS — J9601 Acute respiratory failure with hypoxia: Secondary | ICD-10-CM

## 2022-04-22 DIAGNOSIS — R7989 Other specified abnormal findings of blood chemistry: Secondary | ICD-10-CM | POA: Diagnosis not present

## 2022-04-22 DIAGNOSIS — Z881 Allergy status to other antibiotic agents status: Secondary | ICD-10-CM

## 2022-04-22 DIAGNOSIS — L8990 Pressure ulcer of unspecified site, unspecified stage: Secondary | ICD-10-CM | POA: Diagnosis present

## 2022-04-22 DIAGNOSIS — K862 Cyst of pancreas: Secondary | ICD-10-CM | POA: Diagnosis present

## 2022-04-22 DIAGNOSIS — M109 Gout, unspecified: Secondary | ICD-10-CM | POA: Diagnosis present

## 2022-04-22 DIAGNOSIS — L89151 Pressure ulcer of sacral region, stage 1: Secondary | ICD-10-CM | POA: Diagnosis present

## 2022-04-22 DIAGNOSIS — J90 Pleural effusion, not elsewhere classified: Secondary | ICD-10-CM

## 2022-04-22 DIAGNOSIS — Z79899 Other long term (current) drug therapy: Secondary | ICD-10-CM

## 2022-04-22 DIAGNOSIS — R053 Chronic cough: Secondary | ICD-10-CM | POA: Diagnosis present

## 2022-04-22 DIAGNOSIS — E782 Mixed hyperlipidemia: Secondary | ICD-10-CM | POA: Diagnosis present

## 2022-04-22 DIAGNOSIS — K219 Gastro-esophageal reflux disease without esophagitis: Secondary | ICD-10-CM | POA: Diagnosis present

## 2022-04-22 DIAGNOSIS — Z79891 Long term (current) use of opiate analgesic: Secondary | ICD-10-CM

## 2022-04-22 DIAGNOSIS — F32A Depression, unspecified: Secondary | ICD-10-CM | POA: Diagnosis present

## 2022-04-22 DIAGNOSIS — I10 Essential (primary) hypertension: Secondary | ICD-10-CM | POA: Diagnosis present

## 2022-04-22 DIAGNOSIS — Z1152 Encounter for screening for COVID-19: Secondary | ICD-10-CM

## 2022-04-22 LAB — I-STAT CHEM 8, ED
BUN: 37 mg/dL — ABNORMAL HIGH (ref 8–23)
Calcium, Ion: 1.14 mmol/L — ABNORMAL LOW (ref 1.15–1.40)
Chloride: 101 mmol/L (ref 98–111)
Creatinine, Ser: 6.6 mg/dL — ABNORMAL HIGH (ref 0.44–1.00)
Glucose, Bld: 110 mg/dL — ABNORMAL HIGH (ref 70–99)
HCT: 39 % (ref 36.0–46.0)
Hemoglobin: 13.3 g/dL (ref 12.0–15.0)
Potassium: 5.4 mmol/L — ABNORMAL HIGH (ref 3.5–5.1)
Sodium: 135 mmol/L (ref 135–145)
TCO2: 24 mmol/L (ref 22–32)

## 2022-04-22 LAB — CBC WITH DIFFERENTIAL/PLATELET
Abs Immature Granulocytes: 0.03 10*3/uL (ref 0.00–0.07)
Basophils Absolute: 0 10*3/uL (ref 0.0–0.1)
Basophils Relative: 0 %
Eosinophils Absolute: 0.1 10*3/uL (ref 0.0–0.5)
Eosinophils Relative: 1 %
HCT: 37.7 % (ref 36.0–46.0)
Hemoglobin: 12.2 g/dL (ref 12.0–15.0)
Immature Granulocytes: 0 %
Lymphocytes Relative: 4 %
Lymphs Abs: 0.5 10*3/uL — ABNORMAL LOW (ref 0.7–4.0)
MCH: 28.2 pg (ref 26.0–34.0)
MCHC: 32.4 g/dL (ref 30.0–36.0)
MCV: 87.1 fL (ref 80.0–100.0)
Monocytes Absolute: 0.8 10*3/uL (ref 0.1–1.0)
Monocytes Relative: 6 %
Neutro Abs: 11.7 10*3/uL — ABNORMAL HIGH (ref 1.7–7.7)
Neutrophils Relative %: 89 %
Platelets: 189 10*3/uL (ref 150–400)
RBC: 4.33 MIL/uL (ref 3.87–5.11)
RDW: 19.5 % — ABNORMAL HIGH (ref 11.5–15.5)
WBC: 13.1 10*3/uL — ABNORMAL HIGH (ref 4.0–10.5)
nRBC: 0 % (ref 0.0–0.2)

## 2022-04-22 LAB — COMPREHENSIVE METABOLIC PANEL
ALT: 13 U/L (ref 0–44)
AST: 22 U/L (ref 15–41)
Albumin: 3.3 g/dL — ABNORMAL LOW (ref 3.5–5.0)
Alkaline Phosphatase: 70 U/L (ref 38–126)
Anion gap: 11 (ref 5–15)
BUN: 38 mg/dL — ABNORMAL HIGH (ref 8–23)
CO2: 24 mmol/L (ref 22–32)
Calcium: 8.8 mg/dL — ABNORMAL LOW (ref 8.9–10.3)
Chloride: 100 mmol/L (ref 98–111)
Creatinine, Ser: 5.97 mg/dL — ABNORMAL HIGH (ref 0.44–1.00)
GFR, Estimated: 7 mL/min — ABNORMAL LOW (ref >60)
Glucose, Bld: 111 mg/dL — ABNORMAL HIGH (ref 70–99)
Potassium: 5.5 mmol/L — ABNORMAL HIGH (ref 3.5–5.1)
Sodium: 135 mmol/L (ref 135–145)
Total Bilirubin: 1 mg/dL (ref 0.3–1.2)
Total Protein: 6.6 g/dL (ref 6.5–8.1)

## 2022-04-22 LAB — HEPATITIS B SURFACE ANTIGEN: Hepatitis B Surface Ag: NONREACTIVE

## 2022-04-22 LAB — PROTIME-INR
INR: 1.1 (ref 0.8–1.2)
Prothrombin Time: 14.1 seconds (ref 11.4–15.2)

## 2022-04-22 LAB — HEPATITIS B CORE ANTIBODY, TOTAL: Hep B Core Total Ab: NONREACTIVE

## 2022-04-22 LAB — TROPONIN I (HIGH SENSITIVITY)
Troponin I (High Sensitivity): 421 ng/L (ref <18)
Troponin I (High Sensitivity): 515 ng/L (ref <18)
Troponin I (High Sensitivity): 476 ng/L (ref <18)

## 2022-04-22 LAB — LIPASE, BLOOD: Lipase: 37 U/L (ref 11–51)

## 2022-04-22 LAB — RESP PANEL BY RT-PCR (FLU A&B, COVID) ARPGX2
Influenza A by PCR: NEGATIVE
Influenza B by PCR: NEGATIVE
SARS Coronavirus 2 by RT PCR: NEGATIVE

## 2022-04-22 LAB — GLUCOSE, CAPILLARY: Glucose-Capillary: 100 mg/dL — ABNORMAL HIGH (ref 70–99)

## 2022-04-22 LAB — BRAIN NATRIURETIC PEPTIDE: B Natriuretic Peptide: 4375 pg/mL — ABNORMAL HIGH (ref 0.0–100.0)

## 2022-04-22 MED ORDER — TRAZODONE HCL 50 MG PO TABS: 100.0000 mg | ORAL_TABLET | Freq: Every evening | ORAL | Status: DC | PRN

## 2022-04-22 MED ORDER — ONDANSETRON HCL 4 MG/2ML IJ SOLN
4.0000 mg | Freq: Four times a day (QID) | INTRAMUSCULAR | Status: DC | PRN
  Administered 2022-04-23: 4 mg via INTRAVENOUS
  Filled 2022-04-22: qty 2

## 2022-04-22 MED ORDER — HYDROCODONE-ACETAMINOPHEN 10-325 MG PO TABS
1.0000 | ORAL_TABLET | Freq: Two times a day (BID) | ORAL | Status: DC | PRN
  Administered 2022-04-22 – 2022-04-24 (×4): 1 via ORAL
  Filled 2022-04-22 (×4): qty 1

## 2022-04-22 MED ORDER — LIDOCAINE 5 % EX PTCH
1.0000 | MEDICATED_PATCH | CUTANEOUS | Status: DC
  Administered 2022-04-22 – 2022-04-26 (×5): 1 via TRANSDERMAL
  Filled 2022-04-22 (×6): qty 1

## 2022-04-22 MED ORDER — LEVOFLOXACIN IN D5W 750 MG/150ML IV SOLN
750.0000 mg | Freq: Once | INTRAVENOUS | Status: AC
  Administered 2022-04-22: 750 mg via INTRAVENOUS

## 2022-04-22 MED ORDER — CARVEDILOL 12.5 MG PO TABS
12.5000 mg | ORAL_TABLET | Freq: Two times a day (BID) | ORAL | Status: DC
  Administered 2022-04-22 – 2022-04-24 (×4): 12.5 mg via ORAL
  Filled 2022-04-22 (×4): qty 1

## 2022-04-22 MED ORDER — LEVOFLOXACIN IN D5W 500 MG/100ML IV SOLN: 500.0000 mg | INTRAVENOUS | Status: DC

## 2022-04-22 MED ORDER — HYDRALAZINE HCL 25 MG PO TABS
50.0000 mg | ORAL_TABLET | Freq: Two times a day (BID) | ORAL | Status: DC
  Administered 2022-04-22 – 2022-04-24 (×4): 50 mg via ORAL
  Filled 2022-04-22 (×4): qty 2

## 2022-04-22 MED ORDER — ASPIRIN 81 MG PO CHEW
324.0000 mg | CHEWABLE_TABLET | Freq: Once | ORAL | Status: AC
  Administered 2022-04-22: 324 mg via ORAL
  Filled 2022-04-22: qty 4

## 2022-04-22 MED ORDER — ACETAMINOPHEN 325 MG PO TABS: 650.0000 mg | ORAL_TABLET | Freq: Four times a day (QID) | ORAL | Status: DC | PRN

## 2022-04-22 MED ORDER — HEPARIN SODIUM (PORCINE) 5000 UNIT/ML IJ SOLN
5000.0000 [IU] | Freq: Three times a day (TID) | INTRAMUSCULAR | Status: DC
  Administered 2022-04-22 – 2022-04-26 (×12): 5000 [IU] via SUBCUTANEOUS
  Filled 2022-04-22 (×13): qty 1

## 2022-04-22 MED ORDER — CHLORHEXIDINE GLUCONATE CLOTH 2 % EX PADS
6.0000 | MEDICATED_PAD | Freq: Every day | CUTANEOUS | Status: DC
  Administered 2022-04-23 – 2022-04-26 (×3): 6 via TOPICAL

## 2022-04-22 MED ORDER — HYDRALAZINE HCL 20 MG/ML IJ SOLN
10.0000 mg | INTRAMUSCULAR | Status: DC | PRN
  Administered 2022-04-22 – 2022-04-23 (×3): 10 mg via INTRAVENOUS
  Filled 2022-04-22 (×3): qty 1

## 2022-04-22 MED ORDER — ACETAMINOPHEN 500 MG PO TABS
1000.0000 mg | ORAL_TABLET | Freq: Once | ORAL | Status: AC
  Administered 2022-04-22: 1000 mg via ORAL
  Filled 2022-04-22: qty 2

## 2022-04-22 MED ORDER — ONDANSETRON HCL 4 MG PO TABS: 4.0000 mg | ORAL_TABLET | Freq: Four times a day (QID) | ORAL | Status: DC | PRN

## 2022-04-22 MED ORDER — SODIUM CHLORIDE 0.9 % IV SOLN: 1.0000 g | INTRAVENOUS | Status: DC

## 2022-04-22 MED ORDER — ACETAMINOPHEN 650 MG RE SUPP: 650.0000 mg | Freq: Four times a day (QID) | RECTAL | Status: DC | PRN

## 2022-04-22 MED ORDER — LEVOFLOXACIN IN D5W 750 MG/150ML IV SOLN
750.0000 mg | INTRAVENOUS | Status: DC
  Filled 2022-04-22: qty 150

## 2022-04-22 MED ORDER — BISACODYL 5 MG PO TBEC: 5.0000 mg | DELAYED_RELEASE_TABLET | Freq: Every day | ORAL | Status: DC | PRN

## 2022-04-22 MED ORDER — LEVOFLOXACIN IN D5W 750 MG/150ML IV SOLN: 750.0000 mg | Freq: Once | INTRAVENOUS | Status: DC

## 2022-04-22 MED ORDER — NITROGLYCERIN 0.4 MG SL SUBL
0.4000 mg | SUBLINGUAL_TABLET | Freq: Once | SUBLINGUAL | Status: AC
  Administered 2022-04-22: 0.4 mg via SUBLINGUAL
  Filled 2022-04-22: qty 1

## 2022-04-22 MED ORDER — NITROGLYCERIN 2 % TD OINT
1.0000 [in_us] | TOPICAL_OINTMENT | Freq: Once | TRANSDERMAL | Status: DC
  Filled 2022-04-22: qty 1

## 2022-04-22 MED ORDER — AZITHROMYCIN 250 MG PO TABS
500.0000 mg | ORAL_TABLET | Freq: Every day | ORAL | Status: DC
  Filled 2022-04-22: qty 2

## 2022-04-22 MED ORDER — SODIUM CHLORIDE 0.9 % IV SOLN: 500.0000 mg | INTRAVENOUS | Status: DC

## 2022-04-22 MED ORDER — SODIUM CHLORIDE 0.9 % IV SOLN
2.0000 g | INTRAVENOUS | Status: DC
  Filled 2022-04-22: qty 20

## 2022-04-22 MED ORDER — SODIUM ZIRCONIUM CYCLOSILICATE 5 G PO PACK
10.0000 g | PACK | Freq: Once | ORAL | Status: AC
  Administered 2022-04-22: 10 g via ORAL
  Filled 2022-04-22: qty 2

## 2022-04-22 NOTE — H&P (Signed)
History and Physical  [name] GEX:[id number] DOB: Feb 11, 1941 DOA: 04/22/2022  PCP: [name], NP  Patient coming from: Home  Level of care: Telemetry  I have personally briefly reviewed patient's old medical records in [place]  Chief Complaint: SOB  HPI: Felicia Williams is a 81 year old female with longstanding history of glomerulonephritis now with end-stage renal disease had been on PD for many years and transition to hemodialysis February 2023.  She is on MWF treatments and last treatment was 2 days ago.  She said that there were some trouble with that last treatment but she does not understand what happened.  She has hypertension, gout, GERD, anxiety depression, hyperlipidemia and anemia.  She reports that for the past week she is having increasing shortness of breath at rest and with lying flat.  She has been having to sleep in a recliner due to shortness of breath.  This continues to worsen.  She denies chest pain.  She denies fever and chills.  She is anuric.  She presented to the emergency department today because she has been having worsening shortness of breath and abdominal pain.  She denies nausea vomiting.  She denies diarrhea.  She was noted to have a markedly elevated cardiac BNP of 4375, elevated troponin at 421, hyperkalemic with a K of 5.5.  Her chest x-ray shows findings of interstitial pulmonary edema and congestive heart failure.  Admission requested for further management.   Past Medical History:  Diagnosis Date   Anemia in ESRD (end-stage renal disease) ([place]) 08/18/2018   Coccyx contusion--with chronic pain due to fall    Depression    Dyspnea    ESRD on hemodialysis [facility])    Essential hypertension, benign    Gastric polyps    Gastroparesis    followed by Dr. Laural Golden.   GERD (gastroesophageal reflux disease)    Glomerulonephritis    Gout    Mixed hyperlipidemia    Spondylosis     Past Surgical History:   Procedure Laterality Date   APPENDECTOMY     AV FISTULA PLACEMENT Left 03/20/2022   Procedure: INSERTION OF LEFT UPPER ARM ARTERIOVENOUS (AV) GORE-TEX GRAFT;  Surgeon: Rosetta Posner, MD;  Location: [facility] ORS;  Service: Vascular;  Laterality: Left;   CATARACT EXTRACTION     COLONOSCOPY N/A 12/28/2015   Procedure: COLONOSCOPY;  Surgeon: Rogene Houston, MD;  Location: [facility] ENDO SUITE;  Service: Endoscopy;  Laterality: N/A;  815   ESOPHAGOGASTRODUODENOSCOPY N/A 11/16/2020   Procedure: ESOPHAGOGASTRODUODENOSCOPY (EGD);  Surgeon: Rogene Houston, MD;  Location: [facility] END[facility] SU[facility];  Service: Endoscopy;  Laterality: N/A;  1:15   EXCHANGE OF A DIALYSIS CATHETER N/A 03/20/2022   Procedure: EXCHANGE OF A DIALYSIS CATHETER;  Surgeon: Rosetta Posner, MD;  Location: [facility] ORS;  Service: Vascular;  Laterality: N/A;   INSERTION OF DIALYSIS CATHETER Right 09/30/2021   Procedure: INSERTION OF TUNNELED DIALYSIS CATHETER;  Surgeon: Waynetta Sandy, MD;  Location: [place];  Service: Vascular;  Laterality: Right;   IR FLUORO GUIDE CV LINE RIGHT  01/16/2019   IR REMOVAL TUN CV CATH W/O FL  07/01/2019   IR US GUIDE VASC ACCESS RIGHT  01/16/2019   POLYPECTOMY  11/16/2020   Procedure: POLYPECTOMY;  Surgeon: Rogene Houston, MD;  Location: [facility];  Service: Endoscopy;;  gastric   REMOVAL OF A DIALYSIS CATHETER  09/30/2021   Procedure: REMOVAL OF A PERITONEAL DIALYSIS CATHETER;  Surgeon: Waynetta Sandy, MD;  Location:  [facility] OR;  Service: Vascular;;   TUBAL LIGATION       reports that she has never smoked. She has never been exposed to tobacco smoke. She has never used smokeless tobacco. She reports that she does not drink alcohol and does not use drugs.  Allergies  Allergen Reactions   Cefepime Other (See Comments)    Feb 2023 Encephalopathy with questionable seizures. Seems to be tolerating cefazolin    Morphine     "Dry heaving like crazy"   Sulfa Antibiotics Other (See Comments)    Shut pt's kidneys down     Family History  Problem Relation Age of Onset   CAD Father    Heart attack Father    Diabetes Mellitus II Father    Hypertension Father    Lupus Brother     Prior to Admission medications   Medication Sig Start Date End Date Taking? Authorizing Provider  acetaminophen (TYLENOL) 325 MG tablet Take 2 tablets (650 mg total) by mouth every 6 (six) hours as needed for mild pain. 09/12/21   Love, Ivan Anchors, PA-C  camphor-menthol [facility]) lotion Apply topically as needed for itching. 09/12/21   Love, Ivan Anchors, PA-C  carvedilol (COREG) 12.5 MG tablet Take 1 tablet by mouth 2 (two) times daily.    [provider]  diazepam (VALIUM) 5 MG tablet Take 5 mg by mouth once as needed for anxiety (going to spine doctor). 11/23/21   [provider]  famotidine (PEPCID) 40 MG tablet Take 1 tablet (40 mg total) by mouth at bedtime. Patient not taking: Reported on 02/28/2022 11/28/21   Rogene Houston, MD  hydrALAZINE (APRESOLINE) 50 MG tablet Take 50 mg by mouth 2 (two) times daily.    [provider]  HYDROcodone-acetaminophen (NORCO) 10-325 MG tablet Take 1 tablet by mouth 2 (two) times daily as needed for moderate pain. 02/01/22   [provider]  ketoconazole (NIZORAL) 2 % cream Apply 1 Application topically daily. 02/15/22   [provider]  loperamide (IMODIUM) 2 MG capsule Take 1 capsule (2 mg total) by mouth as needed for diarrhea or loose stools. 09/12/21   Love, Ivan Anchors, PA-C  multivitamin (RENA-VIT) TABS tablet Take 1 tablet by mouth at bedtime. 01/20/19   Nita Sells, MD  traZODone (DESYREL) 100 MG tablet Take 100 mg by mouth at bedtime as needed for sleep.    [provider]    Physical Exam: Vitals:   04/22/22 0830 04/22/22 0845 04/22/22 0938 04/22/22 1030  BP: (!) 193/77  (!) 175/78 (!) 161/59  Pulse: 90 86 81 78  Resp: (!) 39 (!) 37 (!) 41 (!) 32  Temp:      TempSrc:      SpO2: 95% 96% 91% 93%  Weight:      Height:         Constitutional: frail elderly female, awake, NAD, calm, comfortable Eyes: PERRL, lids and conjunctivae normal ENMT: Mucous membranes are moist. Posterior pharynx clear of any exudate or lesions. Neck: normal, supple, no masses, no thyromegaly Respiratory: diffuse crackles heard, moderate increased work of breathing.   Cardiovascular: normal s1, s2 sounds, no murmurs / rubs / gallops. No extremity edema. 2+ pedal pulses. No carotid bruits.  Abdomen: no tenderness, no masses palpated. No hepatosplenomegaly. Bowel sounds positive.  Musculoskeletal: no clubbing / cyanosis. No joint deformity upper and lower extremities. Good ROM, no contractures. Normal muscle tone.  Skin: no rashes, lesions, ulcers. No induration Neurologic: CN 2-12 grossly intact. Sensation intact,  DTR normal. Strength 5/5 in all 4.  Psychiatric: Normal judgment and insight. Alert and oriented x 3. Normal mood.   Labs on Admission: I have personally reviewed following labs and imaging studies  CBC: Recent Labs  Lab 04/22/22 0904 04/22/22 0910  WBC 13.1*  --   NEUTROABS 11.7*  --   HGB 12.2 13.3  HCT 37.7 39.0  MCV 87.1  --   PLT 189  --    Basic Metabolic Panel: Recent Labs  Lab 04/22/22 0904 04/22/22 0910  NA 135 135  K 5.5* 5.4*  CL 100 101  CO2 24  --   GLUCOSE 111* 110*  BUN 38* 37*  CREATININE 5.97* 6.60*  CALCIUM 8.8*  --    GFR: Estimated Creatinine Clearance: 4.5 mL/min (A) (by C-G formula based on SCr of 6.6 mg/dL (H)). Liver Function Tests: Recent Labs  Lab 04/22/22 0904  AST 22  ALT 13  ALKPHOS 70  BILITOT 1.0  PROT 6.6  ALBUMIN 3.3*   Recent Labs  Lab 04/22/22 0904  LIPASE 37   No results for input(s): "AMMONIA" in the last 168 hours. Coagulation Profile: Recent Labs  Lab 04/22/22 0904  INR 1.1   Cardiac Enzymes: No results for input(s): "CKTOTAL", "CKMB", "CKMBINDEX", "TROPONINI" in the last 168 hours. BNP (last 3 results) No results for input(s): "PROBNP" in the  last 8760 hours. HbA1C: No results for input(s): "HGBA1C" in the last 72 hours. CBG: No results for input(s): "GLUCAP" in the last 168 hours. Lipid Profile: No results for input(s): "CHOL", "HDL", "LDLCALC", "TRIG", "CHOLHDL", "LDLDIRECT" in the last 72 hours. Thyroid Function Tests: No results for input(s): "TSH", "T4TOTAL", "FREET4", "T3FREE", "THYROIDAB" in the last 72 hours. Anemia Panel: No results for input(s): "VITAMINB12", "FOLATE", "FERRITIN", "TIBC", "IRON", "RETICCTPCT" in the last 72 hours. Urine analysis:    Component Value Date/Time   COLORURINE COLORLESS (A) 08/29/2021 0827   APPEARANCEUR CLEAR 08/29/2021 0827   LABSPEC 1.005 08/29/2021 0827   PHURINE 8.0 08/29/2021 0827   GLUCOSEU >=500 (A) 08/29/2021 0827   HGBUR NEGATIVE 08/29/2021 0827   BILIRUBINUR NEGATIVE 08/29/2021 0827   KETONESUR NEGATIVE 08/29/2021 0827   PROTEINUR 30 (A) 08/29/2021 0827   UROBILINOGEN 0.2 01/11/2014 1635   NITRITE NEGATIVE 08/29/2021 0827   LEUKOCYTESUR NEGATIVE 08/29/2021 0827    Radiological Exams on Admission: DG Chest Portable 1 View  Result Date: 04/22/2022 CLINICAL DATA:  Short of breath for 1 week. EXAM: PORTABLE CHEST 1 VIEW COMPARISON:  03/20/2022 and older exams. FINDINGS: Bilateral irregular interstitial thickening. Lung base opacities consistent with a combination of pleural effusions and atelectasis. Cardiac silhouette partly obscured, stable from prior exams. No mediastinal or hilar masses. Right anterior chest wall tunneled central venous catheter is stable. No pneumothorax. IMPRESSION: 1. Findings consistent with congestive heart failure with interstitial pulmonary edema and new bilateral pleural effusions with associated lung base atelectasis. Electronically Signed   By: Lajean Manes M.D.   On: 04/22/2022 09:01    EKG: Independently reviewed. No peaked T waves  Assessment/Plan Principal Problem:   Volume overload Active Problems:   Hyperkalemia   Essential  hypertension, benign   Anxiety and depression   GERD (gastroesophageal reflux disease)   Gastroparesis   Chronic cough   ESRD on dialysis (HCC)   Gout   Restless legs syndrome   Gastroesophageal reflux disease   Generalized weakness   Acute diastolic heart failure (HCC)   Elevated troponin   Assessment and Plan: * Volume overload - Hemodialysis requested  Hyperkalemia - Treated with hemodialysis, monitor on telemetry  Elevated troponin - Demand ischemia due to congestive heart failure, do not suspect ACS  Acute diastolic heart failure (HCC) - Secondary to volume overload, plan to treat with hemodialysis for volume removal as patient is anuric  Generalized weakness - Multifactorial given heart failure presentation and end-stage renal disease  Gastroesophageal reflux disease - Protonix for GI protection  Restless legs syndrome - Stable  Gout - Reportedly stable no current symptoms  ESRD on dialysis [facility]) - Patient needs hemodialysis, last the ED provider to contact the nephrologist to make arrangements for a treatment today  Chronic cough - Stable, follow-up respiratory panel ordered in ED  Gastroparesis - Currently stable no symptoms, follow-up with GI outpatient  Anxiety and depression - Reportedly stable  Essential hypertension, benign - Resume home medication   DVT prophylaxis: [employer] heparin   Code Status: full   Family Communication:   Disposition Plan: anticipating home   Consults called: nephrology   Admission status: OBV   Level of care: Telemetry Irwin Brakeman MD [facility] How to contact the [facility] Attending or Consulting provider [place] or covering provider during after hours 7P -7A, for this patient?  Check the care team in [facility] and look for a) attending/consulting [facility] provider listed and b) the [facility] team listed Log into [email or web address] and use [place]'s universal password to access. If you do not have the password, please contact the  hospital operator. Locate the [facility] provider you are looking for under [facility] and page to a number that you can be directly reached. If you still have difficulty reaching the provider, please page the [facility] (Director on Call) for the Hospitalists listed on amion for assistance.   If 7PM-7AM, please contact night-coverage [email or web address] Password [email or web address]  04/22/2022, 11:15 AM

## 2022-04-22 NOTE — Assessment & Plan Note (Signed)
-   Treated with hemodialysis, monitor on telemetry

## 2022-04-22 NOTE — Assessment & Plan Note (Signed)
-   Secondary to volume overload, plan to treat with hemodialysis for volume removal as patient is anuric -- pt remains volume overloaded since HD treatment 10/3 -- nephrology planning for additional HD treatments  -- CXR appears slightly worse today

## 2022-04-22 NOTE — Assessment & Plan Note (Signed)
-   Reportedly stable

## 2022-04-22 NOTE — Assessment & Plan Note (Signed)
-   Demand ischemia due to fluid overload and ESRD, do not suspect ACS -no chest pain

## 2022-04-22 NOTE — Assessment & Plan Note (Addendum)
-   Resumed home coreg at lower dose initially>>titrate back to home dose - holding hydralazine>>restart

## 2022-04-22 NOTE — Assessment & Plan Note (Signed)
-   Reportedly stable no current symptoms

## 2022-04-22 NOTE — ED Triage Notes (Signed)
BIB EMS from home with complaints of [facility] for the last week with abdominal pain this am. Dialysis completed Friday with reports of worsening SHOB since then. Per EMS, sats were 89% on RA. Placed on N/C with improvement noted. Denies recent fever, sickness.

## 2022-04-22 NOTE — ED Provider Notes (Signed)
[facility] EMERGENCY DEPARTMENT Provider Note   CSN: [id number] Arrival date & time: 04/22/22  1517     History  Chief Complaint  Patient presents with  . Shortness of Breath  . Abdominal Pain    Felicia Williams is a 81 y.o. female. With pmh gastroparesis, ESRD on HD MWF last dialysis session Friday, September 29, HTN, chronic pancreatic cyst, gout presenting with 1 week of worsening dyspnea.  When EMS arrived to pick her up, she was satting mid to high 80s on room air.  She does not use oxygen at baseline.  Patient says over the past week she has been having worsening dyspnea at rest but also significantly when laying flat with PND.  She has been trying to sleep in a recliner but her shortness of breath has been worsening.  She is unsure of what her dry weight is because she said she has been losing weight.  She denies any leg pain or swelling.  No history of PE or DVT.  She has developed a new dry nonproductive cough.  However no fevers at home, no chest pain, no vomiting, no diarrhea, no body aches.  She says her last dialysis session was a full session.  She does not make any urine.   Shortness of Breath Associated symptoms: abdominal pain   Abdominal Pain Associated symptoms: shortness of breath        Home Medications Prior to Admission medications   Medication Sig Start Date End Date Taking? Authorizing Provider  acetaminophen (TYLENOL) 325 MG tablet Take 2 tablets (650 mg total) by mouth every 6 (six) hours as needed for mild pain. 09/12/21  Yes Love, Ivan Anchors, PA-C  camphor-menthol [facility]) lotion Apply topically as needed for itching. Patient taking differently: Apply 1 Application topically as needed for itching. 09/12/21  Yes Love, Ivan Anchors, PA-C  carvedilol (COREG) 12.5 MG tablet Take 1 tablet by mouth 2 (two) times daily.   Yes [provider]  famotidine (PEPCID) 40 MG tablet Take 1 tablet (40 mg total) by mouth at bedtime. 11/28/21  Yes Rehman, Mechele Dawley, MD   hydrALAZINE (APRESOLINE) 50 MG tablet Take 50 mg by mouth 2 (two) times daily.   Yes [provider]  HYDROcodone-acetaminophen (NORCO) 10-325 MG tablet Take 1 tablet by mouth 2 (two) times daily as needed for moderate pain. 02/01/22  Yes [provider]  Lactobacillus Probiotic TABS Take 1 capsule by mouth daily.   Yes [provider]  loperamide (IMODIUM) 2 MG capsule Take 1 capsule (2 mg total) by mouth as needed for diarrhea or loose stools. Patient taking differently: Take 2 mg by mouth in the morning and at bedtime. 09/12/21  Yes Love, Ivan Anchors, PA-C  multivitamin (RENA-VIT) TABS tablet Take 1 tablet by mouth at bedtime. 01/20/19  Yes Nita Sells, MD  diazepam (VALIUM) 5 MG tablet Take 5 mg by mouth once as needed for anxiety (going to spine doctor). Patient not taking: Reported on 04/22/2022 11/23/21   [provider]  ketoconazole (NIZORAL) 2 % cream Apply 1 Application topically daily. Patient not taking: Reported on 04/22/2022 02/15/22   [provider]  traZODone (DESYREL) 100 MG tablet Take 100 mg by mouth at bedtime as needed for sleep. Patient not taking: Reported on 04/22/2022    [provider]      Allergies    Cefepime, Morphine, and Sulfa antibiotics    Review of Systems   Review of Systems  Respiratory:  Positive for shortness of  breath.   Gastrointestinal:  Positive for abdominal pain.    Physical Exam Updated Vital Signs BP (!) 189/74   Pulse 83   Temp 97.8 F (36.6 C) (Oral)   Resp (!) 27   Ht 4\' 11"  (1.499 m)   Wt 45.4 kg   SpO2 96%   BMI 20.20 kg/m  Physical Exam Constitutional: Alert and oriented.  Mild to moderate respiratory distress but able to speak sentences, nontoxic Eyes: Conjunctivae are normal. ENT      Head: Normocephalic and atraumatic.      Nose: No congestion.      Mouth/Throat: Mucous membranes are moist.      Neck: No stridor. Cardiovascular: S1, S2, regular rate and rhythm,  warm and dry Respiratory: Tachypnea to the high 30s low 40s with decreased breath sounds at the bases, no wheezes, no rhonchi Gastrointestinal: Soft and nontender.  Musculoskeletal: Normal range of motion in all extremities.      Right lower leg: No tenderness or edema.      Left lower leg: No tenderness or edema. Neurologic: Normal speech and language. No gross focal neurologic deficits are appreciated. Skin: Skin is warm, dry and intact. No rash noted. Psychiatric: Mood and affect are normal. Speech and behavior are normal.  ED Results / Procedures / Treatments   Labs (all labs ordered are listed, but only abnormal results are displayed) Labs Reviewed  BRAIN NATRIURETIC PEPTIDE - Abnormal; Notable for the following components:      Result Value   B Natriuretic Peptide 4,375.0 (*)    All other components within normal limits  COMPREHENSIVE METABOLIC PANEL - Abnormal; Notable for the following components:   Potassium 5.5 (*)    Glucose, Bld 111 (*)    BUN 38 (*)    Creatinine, Ser 5.97 (*)    Calcium 8.8 (*)    Albumin 3.3 (*)    GFR, Estimated 7 (*)    All other components within normal limits  CBC WITH DIFFERENTIAL/PLATELET - Abnormal; Notable for the following components:   WBC 13.1 (*)    RDW 19.5 (*)    Neutro Abs 11.7 (*)    Lymphs Abs 0.5 (*)    All other components within normal limits  I-STAT CHEM 8, ED - Abnormal; Notable for the following components:   Potassium 5.4 (*)    BUN 37 (*)    Creatinine, Ser 6.60 (*)    Glucose, Bld 110 (*)    Calcium, Ion 1.14 (*)    All other components within normal limits  TROPONIN I (HIGH SENSITIVITY) - Abnormal; Notable for the following components:   Troponin I (High Sensitivity) 421 (*)    All other components within normal limits  TROPONIN I (HIGH SENSITIVITY) - Abnormal; Notable for the following components:   Troponin I (High Sensitivity) 515 (*)    All other components within normal limits  RESP PANEL BY RT-PCR (FLU A&B,  COVID) ARPGX2  CULTURE, BLOOD (ROUTINE X 2)  CULTURE, BLOOD (ROUTINE X 2)  PROTIME-INR  LIPASE, BLOOD  CBC WITH DIFFERENTIAL/PLATELET  HEPATITIS B SURFACE ANTIGEN  HEPATITIS B SURFACE ANTIBODY,QUALITATIVE  HEPATITIS B SURFACE ANTIBODY, QUANTITATIVE  HEPATITIS B CORE ANTIBODY, TOTAL  HEPATITIS C ANTIBODY  RENAL FUNCTION PANEL  CBC  TROPONIN I (HIGH SENSITIVITY)    EKG EKG Interpretation  Date/Time:  Sunday April 22 2022 10:31:00 EDT Ventricular Rate:  79 PR Interval:  139 QRS Duration: 95 QT Interval:  416 QTC Calculation: 477 R Axis:   -18  Text Interpretation: Sinus rhythm Borderline left axis deviation Abnormal R-wave progression, late transition Borderline T abnormalities, diffuse leads Nonspecific T wave changes inferior leads and lateral leads Confirmed by Georgina Snell [phone]) on 04/22/2022 10:33:34 AM  Radiology DG Chest Portable 1 View  Result Date: 04/22/2022 CLINICAL DATA:  Short of breath for 1 week. EXAM: PORTABLE CHEST 1 VIEW COMPARISON:  03/20/2022 and older exams. FINDINGS: Bilateral irregular interstitial thickening. Lung base opacities consistent with a combination of pleural effusions and atelectasis. Cardiac silhouette partly obscured, stable from prior exams. No mediastinal or hilar masses. Right anterior chest wall tunneled central venous catheter is stable. No pneumothorax. IMPRESSION: 1. Findings consistent with congestive heart failure with interstitial pulmonary edema and new bilateral pleural effusions with associated lung base atelectasis. Electronically Signed   By: Lajean Manes M.D.   On: 04/22/2022 09:01    Procedures .Critical Care  Performed by: Elgie Congo, MD Authorized by: Elgie Congo, MD   Critical care provider statement:    Critical care time (minutes):  35   Critical care was necessary to treat or prevent imminent or life-threatening deterioration of the following conditions:  Cardiac failure, renal failure and  respiratory failure   Critical care was time spent personally by me on the following activities:  Development of treatment plan with patient or surrogate, discussions with consultants, evaluation of patient's response to treatment, examination of patient, ordering and review of laboratory studies, ordering and review of radiographic studies, ordering and performing treatments and interventions, pulse oximetry, re-evaluation of patient's condition, review of old charts and obtaining history from patient or surrogate   Care discussed with: admitting provider     Remain on constant cardiac monitoring, normal sinus rhythm with normal rate.  Medications Ordered in ED Medications  heparin injection 5,000 Units (5,000 Units Subcutaneous Given 04/22/22 1656)  acetaminophen (TYLENOL) tablet 650 mg (has no administration in time range)    Or  acetaminophen (TYLENOL) suppository 650 mg (has no administration in time range)  bisacodyl (DULCOLAX) EC tablet 5 mg (has no administration in time range)  ondansetron (ZOFRAN) tablet 4 mg (has no administration in time range)    Or  ondansetron (ZOFRAN) injection 4 mg (has no administration in time range)  Chlorhexidine Gluconate Cloth 2 % PADS 6 each (has no administration in time range)  HYDROcodone-acetaminophen (NORCO) 10-325 MG per tablet 1 tablet (1 tablet Oral Given 04/22/22 1647)  carvedilol (COREG) tablet 12.5 mg (12.5 mg Oral Given 04/22/22 1826)  hydrALAZINE (APRESOLINE) tablet 50 mg (has no administration in time range)  traZODone (DESYREL) tablet 100 mg (has no administration in time range)  hydrALAZINE (APRESOLINE) injection 10 mg (10 mg Intravenous Given 04/22/22 1641)  lidocaine (LIDODERM) 5 % 1 patch (1 patch Transdermal Patch Applied 04/22/22 1650)  nitroGLYCERIN (NITROSTAT) SL tablet 0.4 mg (0.4 mg Sublingual Given 04/22/22 0937)  acetaminophen (TYLENOL) tablet 1,000 mg (1,000 mg Oral Given 04/22/22 0936)  aspirin chewable tablet 324 mg (324 mg Oral  Given 04/22/22 1105)  levofloxacin (LEVAQUIN) IVPB 750 mg (0 mg Intravenous Stopped 04/22/22 1216)  sodium zirconium cyclosilicate (LOKELMA) packet 10 g (10 g Oral Given 04/22/22 1342)    ED Course/ Medical Decision Making/ A&P Clinical Course as of 04/22/22 1853  Sun Apr 22, 2022  1048 Discussed case with cardiologist Dr. Johney Frame who is in agreement to hold off from heparin at this time and to continue to trending her troponins.  Suspected that this is likely demand ischemia in the setting of fluid  overload and hypertension.  She has no active chest pain.  She recommends continuing trending tropes and starting heparin if there is a significant elevation but we can hold off at this time.  I am paging hospitalist for admission for fluid overload hypoxia and demand ischemia. [VB]  [id number] Spoke with Dr. Wynetta Emery who is in agreement that she needs emergent dialysis.  Will consult nephrology.  Mildly hyperkalemic 5.4 but no EKG changes. [VB]  1139 Spoke with nephrology team Hollie Salk who is discussing with her dialysis team here and nurse to determine if patient can get dialysis here today. [VB]    Clinical Course User Index [VB] Elgie Congo, MD                           Medical Decision Making Lometa Riggin is a 81 y.o. female. With pmh gastroparesis, ESRD on HD MWF last dialysis session Friday, September 29, HTN, chronic pancreatic cyst, gout presenting with 1 week of worsening dyspnea.  When EMS arrived to pick her up, she was satting mid to high 80s on room air.  She does not use oxygen at baseline.  Patient presents with worsening dyspnea at rest, on exertion and mainly with orthopnea and PND.  She has no signs of peripheral edema but with her known ESRD, favor fluid overload contributing to symptoms.  Her chest x-ray was obtained which showed bilateral pleural effusions as well as interstitial infiltrates consistent with pulmonary edema.  Consider superimposed infection as patient was  borderline febrile here to 37.7 C and her white count was elevated 13.1 with left shift.  Although I do suspect this is more likely due to fluid overload, will order for community-acquired pneumonia antibiotics..  No wheezing or smoking use suggestive of COPD exacerbation.  Less likely ACS, EKG nonspecific ST/T changes inferior and lateral leads similar to previous but denying any active chest pain, vomiting, diaphoresis.  Her troponin was elevated for 421 suspect demand ischemia in the setting of fluid overload. ASA 324 mg ordered.   Ordered for sublingual nitroglycerin tablet and BiPAP due to patient's increased work of breathing despite satting 95% on 3 L nasal cannula.  Anticipate admission with new hypoxia and fluid overload contributing to worsening dyspnea and hypoxia at rest. Patient will need HD.  Amount and/or Complexity of Data Reviewed Labs: ordered. Radiology: ordered.  Risk OTC drugs. Prescription drug management. Decision regarding hospitalization.    Final Clinical Impression(s) / ED Diagnoses Final diagnoses:  Dyspnea, unspecified type  Hypoxia  Pleural effusion  Acute pulmonary edema ([place])  Elevated troponin    Rx / DC Orders ED Discharge Orders     None         Elgie Congo, MD 04/22/22 1853

## 2022-04-22 NOTE — Assessment & Plan Note (Signed)
Stable

## 2022-04-22 NOTE — Progress Notes (Signed)
Pt in for vol overload with new O2 req of 3L and K 5.5.  Lokelma 10 g once ordered HD orders in for first shift in AM May need serial dialysis.    Call with questions/ change in pt status.  Madelon Lips MD [employer]

## 2022-04-22 NOTE — Assessment & Plan Note (Signed)
-   Protonix for GI protection 

## 2022-04-22 NOTE — Hospital Course (Signed)
82 year old female with longstanding history of glomerulonephritis now with end-stage renal disease had been on PD for many years and transition to hemodialysis February 2023.  She is on MWF treatments and last treatment was 2 days ago.  She said that there were some trouble with that last treatment but she does not understand what happened.  She has hypertension, gout, GERD, anxiety depression, hyperlipidemia and anemia.  She reports that for the past week she is having increasing shortness of breath at rest and with lying flat.  She has been having to sleep in a recliner due to shortness of breath.  This continues to worsen.  She denies chest pain.  She denies fever and chills.  She is anuric.  She presented to the emergency department today because she has been having worsening shortness of breath and abdominal pain.  She denies nausea vomiting.  She denies diarrhea.  She was noted to have a markedly elevated cardiac BNP of 4375, elevated troponin at 421, hyperkalemic with a K of 5.5.  Her chest x-ray shows findings of interstitial pulmonary edema and congestive heart failure.  Admission requested for further management.

## 2022-04-22 NOTE — ED Notes (Signed)
Patient assessed by RT for Bipap placement. She is comfortable and not SOB right now on 3lpm. SPO2 is 94% and RR is around 30. RN notified to contact me if there is a change.

## 2022-04-22 NOTE — Assessment & Plan Note (Signed)
-  due to indiscretion with fluid intake at home -10/2--UF 2.6L -10/4-UF 2.5L   -1200 cc fluid restrict every 24 hours -04/26/22--improved, clinically euvolemic

## 2022-04-22 NOTE — Assessment & Plan Note (Signed)
-   Multifactorial given heart failure presentation and end-stage renal disease.  Will request PT eval >>no follow up needed

## 2022-04-22 NOTE — Assessment & Plan Note (Signed)
-   extra HD treatments per nephrologist for volume removal -appreciate nephrology --10/2--UF 2.6L -10/4-UF 2.5L   -1200 cc fluid restrict every 24 hours

## 2022-04-22 NOTE — Assessment & Plan Note (Addendum)
-   Stable, follow-up respiratory panel ordered in ED

## 2022-04-22 NOTE — Assessment & Plan Note (Addendum)
-   Currently stable no symptoms, follow-up with GI outpatient Tolerating diet

## 2022-04-23 DIAGNOSIS — R053 Chronic cough: Secondary | ICD-10-CM

## 2022-04-23 DIAGNOSIS — D631 Anemia in chronic kidney disease: Secondary | ICD-10-CM | POA: Diagnosis present

## 2022-04-23 DIAGNOSIS — N2581 Secondary hyperparathyroidism of renal origin: Secondary | ICD-10-CM | POA: Diagnosis present

## 2022-04-23 DIAGNOSIS — F32A Depression, unspecified: Secondary | ICD-10-CM | POA: Diagnosis present

## 2022-04-23 DIAGNOSIS — L89151 Pressure ulcer of sacral region, stage 1: Secondary | ICD-10-CM | POA: Diagnosis present

## 2022-04-23 DIAGNOSIS — J9601 Acute respiratory failure with hypoxia: Secondary | ICD-10-CM | POA: Diagnosis not present

## 2022-04-23 DIAGNOSIS — I2489 Other forms of acute ischemic heart disease: Secondary | ICD-10-CM | POA: Diagnosis present

## 2022-04-23 DIAGNOSIS — L8990 Pressure ulcer of unspecified site, unspecified stage: Secondary | ICD-10-CM | POA: Diagnosis present

## 2022-04-23 DIAGNOSIS — E8779 Other fluid overload: Secondary | ICD-10-CM | POA: Diagnosis not present

## 2022-04-23 DIAGNOSIS — K3184 Gastroparesis: Secondary | ICD-10-CM | POA: Diagnosis present

## 2022-04-23 DIAGNOSIS — E875 Hyperkalemia: Secondary | ICD-10-CM | POA: Diagnosis present

## 2022-04-23 DIAGNOSIS — Z79891 Long term (current) use of opiate analgesic: Secondary | ICD-10-CM | POA: Diagnosis not present

## 2022-04-23 DIAGNOSIS — Z992 Dependence on renal dialysis: Secondary | ICD-10-CM | POA: Diagnosis not present

## 2022-04-23 DIAGNOSIS — I5031 Acute diastolic (congestive) heart failure: Secondary | ICD-10-CM | POA: Diagnosis present

## 2022-04-23 DIAGNOSIS — R7989 Other specified abnormal findings of blood chemistry: Secondary | ICD-10-CM | POA: Diagnosis not present

## 2022-04-23 DIAGNOSIS — Z8249 Family history of ischemic heart disease and other diseases of the circulatory system: Secondary | ICD-10-CM | POA: Diagnosis not present

## 2022-04-23 DIAGNOSIS — E782 Mixed hyperlipidemia: Secondary | ICD-10-CM | POA: Diagnosis present

## 2022-04-23 DIAGNOSIS — Z79899 Other long term (current) drug therapy: Secondary | ICD-10-CM | POA: Diagnosis not present

## 2022-04-23 DIAGNOSIS — J810 Acute pulmonary edema: Secondary | ICD-10-CM | POA: Diagnosis present

## 2022-04-23 DIAGNOSIS — Z1152 Encounter for screening for COVID-19: Secondary | ICD-10-CM | POA: Diagnosis not present

## 2022-04-23 DIAGNOSIS — R34 Anuria and oliguria: Secondary | ICD-10-CM | POA: Diagnosis present

## 2022-04-23 DIAGNOSIS — K862 Cyst of pancreas: Secondary | ICD-10-CM | POA: Diagnosis present

## 2022-04-23 DIAGNOSIS — F419 Anxiety disorder, unspecified: Secondary | ICD-10-CM | POA: Diagnosis present

## 2022-04-23 DIAGNOSIS — I132 Hypertensive heart and chronic kidney disease with heart failure and with stage 5 chronic kidney disease, or end stage renal disease: Secondary | ICD-10-CM | POA: Diagnosis present

## 2022-04-23 DIAGNOSIS — K219 Gastro-esophageal reflux disease without esophagitis: Secondary | ICD-10-CM | POA: Diagnosis present

## 2022-04-23 DIAGNOSIS — G2581 Restless legs syndrome: Secondary | ICD-10-CM | POA: Diagnosis present

## 2022-04-23 DIAGNOSIS — M109 Gout, unspecified: Secondary | ICD-10-CM | POA: Diagnosis present

## 2022-04-23 DIAGNOSIS — N186 End stage renal disease: Secondary | ICD-10-CM | POA: Diagnosis present

## 2022-04-23 LAB — RENAL FUNCTION PANEL
Albumin: 2.9 g/dL — ABNORMAL LOW (ref 3.5–5.0)
Anion gap: 11 (ref 5–15)
BUN: 54 mg/dL — ABNORMAL HIGH (ref 8–23)
CO2: 22 mmol/L (ref 22–32)
Calcium: 8.7 mg/dL — ABNORMAL LOW (ref 8.9–10.3)
Chloride: 99 mmol/L (ref 98–111)
Creatinine, Ser: 6.64 mg/dL — ABNORMAL HIGH (ref 0.44–1.00)
GFR, Estimated: 6 mL/min — ABNORMAL LOW (ref >60)
Glucose, Bld: 92 mg/dL (ref 70–99)
Phosphorus: 6.4 mg/dL — ABNORMAL HIGH (ref 2.5–4.6)
Potassium: 5.7 mmol/L — ABNORMAL HIGH (ref 3.5–5.1)
Sodium: 132 mmol/L — ABNORMAL LOW (ref 135–145)

## 2022-04-23 LAB — HEPATITIS B SURFACE ANTIBODY,QUALITATIVE: Hep B S Ab: NONREACTIVE

## 2022-04-23 LAB — CBC
HCT: 33.7 % — ABNORMAL LOW (ref 36.0–46.0)
Hemoglobin: 10.5 g/dL — ABNORMAL LOW (ref 12.0–15.0)
MCH: 27.4 pg (ref 26.0–34.0)
MCHC: 31.2 g/dL (ref 30.0–36.0)
MCV: 88 fL (ref 80.0–100.0)
Platelets: 180 10*3/uL (ref 150–400)
RBC: 3.83 MIL/uL — ABNORMAL LOW (ref 3.87–5.11)
RDW: 19.1 % — ABNORMAL HIGH (ref 11.5–15.5)
WBC: 8.6 10*3/uL (ref 4.0–10.5)
nRBC: 0 % (ref 0.0–0.2)

## 2022-04-23 LAB — HEPATITIS B SURFACE ANTIBODY, QUANTITATIVE: Hep B S AB Quant (Post): <3.1 m[IU]/mL — ABNORMAL LOW (ref >9.9)

## 2022-04-23 LAB — HEPATITIS C ANTIBODY: HCV Ab: NONREACTIVE

## 2022-04-23 LAB — MRSA NEXT GEN BY PCR, NASAL: MRSA by PCR Next Gen: NOT DETECTED

## 2022-04-23 LAB — GLUCOSE, CAPILLARY: Glucose-Capillary: 70 mg/dL (ref 70–99)

## 2022-04-23 MED ORDER — HEPARIN SODIUM (PORCINE) 1000 UNIT/ML DIALYSIS: 1000.0000 [IU] | INTRAMUSCULAR | Status: DC | PRN

## 2022-04-23 MED ORDER — HEPARIN SODIUM (PORCINE) 1000 UNIT/ML DIALYSIS: 2000.0000 [IU] | Freq: Once | INTRAMUSCULAR | Status: DC

## 2022-04-23 MED ORDER — HEPARIN SODIUM (PORCINE) 1000 UNIT/ML IJ SOLN
INTRAMUSCULAR | Status: AC
  Filled 2022-04-23: qty 8

## 2022-04-23 MED ORDER — ALTEPLASE 2 MG IJ SOLR: 2.0000 mg | Freq: Once | INTRAMUSCULAR | Status: DC | PRN

## 2022-04-23 NOTE — Procedures (Signed)
   HEMODIALYSIS TREATMENT NOTE:  3.5 hour low-heparin HD completed using RIJ TDC; entry site is unremarkable.  Has AVF in LUE but states, "we're not using that yet."  Goal had to be lowered after one episode of symptomatic hypotension which was corrected with NS bolus and interruption in UF.  Goal was then lowered.  Net UF 2.6 liters.  Unable to wean off of O2; desaturates to 88% on RA.  Dr. Wynetta Emery was notified   Rockwell Alexandria, RN

## 2022-04-23 NOTE — Consult Note (Signed)
Felicia Williams Admit Date: 04/22/2022 04/23/2022 Rexene Agent Requesting Physician:  Murvin Natal MD  Reason for Consult:  ESRD, SOB, HyperK HPI:  62F ESRD MWF HD [name] admitted 10/1 with progressive dyspnea.  PMH includes GN, GERD, MDD/anxiety.  Uses TDC for HD.  S/p LUE AVG with Dr. Donnetta Hutching 03/20/22.   She notes sig DOE and orthopnea.  ED w/u with sig inc BPs, req 3-4L [place]. pCXR with interstitial edema and new b/l pleural effusions.  Labs with K 5.7, HCO3 22, BUN 54, Hb 10.5.  Home BP meds include carvedilol and hydralazine.  No BP med allergies.   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 Laural Golden.   GERD (gastroesophageal reflux disease)    Glomerulonephritis    Gout    Mixed hyperlipidemia    Spondylosis    PSH  Past Surgical History:  Procedure Laterality Date   APPENDECTOMY     AV FISTULA PLACEMENT Left 03/20/2022   Procedure: INSERTION OF LEFT UPPER ARM ARTERIOVENOUS (AV) GORE-TEX GRAFT;  Surgeon: Rosetta Posner, MD;  Location: [facility];  Service: Vascular;  Laterality: Left;   CATARACT EXTRACTION     COLONOSCOPY N/A 12/28/2015   Procedure: COLONOSCOPY;  Surgeon: Rogene Houston, MD;  Location: [facility];  Service: Endoscopy;  Laterality: N/A;  815   ESOPHAGOGASTRODUODENOSCOPY N/A 11/16/2020   Procedure: ESOPHAGOGASTRODUODENOSCOPY (EGD);  Surgeon: Rogene Houston, MD;  Location: [facility];  Service: Endoscopy;  Laterality: N/A;  1:15   EXCHANGE OF A DIALYSIS CATHETER N/A 03/20/2022   Procedure: EXCHANGE OF A DIALYSIS CATHETER;  Surgeon: Rosetta Posner, MD;  Location: [facility];  Service: Vascular;  Laterality: N/A;   INSERTION OF DIALYSIS CATHETER Right  09/30/2021   Procedure: INSERTION OF TUNNELED DIALYSIS CATHETER;  Surgeon: Waynetta Sandy, MD;  Location: [place];  Service: Vascular;  Laterality: Right;   IR FLUORO GUIDE CV LINE RIGHT  01/16/2019   IR REMOVAL TUN CV CATH W/O FL  07/01/2019   IR US GUIDE VASC ACCESS RIGHT  01/16/2019   POLYPECTOMY  11/16/2020   Procedure: POLYPECTOMY;  Surgeon: Rogene Houston, MD;  [facility]: [facility];  Service: Endoscopy;;  gastric   REMOVAL OF A DIALYSIS CATHETER  09/30/2021   Procedure: REMOVAL OF A PERITONEAL DIALYSIS CATHETER;  Surgeon: Waynetta Sandy, MD;  Location: [place];  Service: Vascular;;   TUBAL LIGATION     FH  Family History  Problem Relation Age of Onset   CAD Father    Heart attack Father    Diabetes Mellitus II Father    Hypertension Father    Lupus Brother    [place]  reports that she has never smoked. She has never been exposed to tobacco smoke. She has never used smokeless tobacco. She reports that she does not drink alcohol and does not use drugs. Allergies  Allergies  Allergen Reactions   Cefepime Other (See Comments)    Feb 2023 Encephalopathy with questionable seizures. Seems to be tolerating cefazolin    Morphine Other (See Comments)    "Dry heaving like crazy"  Sulfa Antibiotics Other (See Comments)    Shut pt's kidneys down   Home medications Prior to Admission medications   Medication Sig Start Date End Date Taking? Authorizing Provider  acetaminophen (TYLENOL) 325 MG tablet Take 2 tablets (650 mg total) by mouth every 6 (six) hours as needed for mild pain. 09/12/21  Yes Love, Ivan Anchors, PA-C  camphor-menthol [facility]) lotion Apply topically as needed for itching. Patient taking differently: Apply 1 Application topically as needed for itching. 09/12/21  Yes Love, Ivan Anchors, PA-C  carvedilol (COREG) 12.5 MG tablet Take 1 tablet by mouth 2 (two) times daily.   Yes [provider]  famotidine (PEPCID) 40 MG tablet Take 1 tablet (40 mg total) by mouth  at bedtime. 11/28/21  Yes Rehman, Mechele Dawley, MD  hydrALAZINE (APRESOLINE) 50 MG tablet Take 50 mg by mouth 2 (two) times daily.   Yes [provider]  HYDROcodone-acetaminophen (NORCO) 10-325 MG tablet Take 1 tablet by mouth 2 (two) times daily as needed for moderate pain. 02/01/22  Yes [provider]  Lactobacillus Probiotic TABS Take 1 capsule by mouth daily.   Yes [provider]  loperamide (IMODIUM) 2 MG capsule Take 1 capsule (2 mg total) by mouth as needed for diarrhea or loose stools. Patient taking differently: Take 2 mg by mouth in the morning and at bedtime. 09/12/21  Yes Love, Ivan Anchors, PA-C  multivitamin (RENA-VIT) TABS tablet Take 1 tablet by mouth at bedtime. 01/20/19  Yes Nita Sells, MD  diazepam (VALIUM) 5 MG tablet Take 5 mg by mouth once as needed for anxiety (going to spine doctor). Patient not taking: Reported on 04/22/2022 11/23/21   [provider]  ketoconazole (NIZORAL) 2 % cream Apply 1 Application topically daily. Patient not taking: Reported on 04/22/2022 02/15/22   [provider]  traZODone (DESYREL) 100 MG tablet Take 100 mg by mouth at bedtime as needed for sleep. Patient not taking: Reported on 04/22/2022    [provider]    Current Medications Scheduled Meds:  carvedilol  12.5 mg Oral BID   Chlorhexidine Gluconate Cloth  6 each Topical Q0600   heparin  5,000 Units Subcutaneous Q8H   hydrALAZINE  50 mg Oral BID   lidocaine  1 patch Transdermal Q24H   Continuous Infusions: PRN Meds:.acetaminophen **OR** acetaminophen, bisacodyl, hydrALAZINE, HYDROcodone-acetaminophen, ondansetron **OR** ondansetron (ZOFRAN) IV, traZODone  CBC Recent Labs  Lab 04/22/22 0904 04/22/22 0910 04/23/22 0404  WBC 13.1*  --  8.6  NEUTROABS 11.7*  --   --   HGB 12.2 13.3 10.5*  HCT 37.7 39.0 33.7*  MCV 87.1  --  88.0  PLT 189  --  009   Basic Metabolic Panel Recent Labs  Lab 04/22/22 0904 04/22/22 0910  04/23/22 0404  NA 135 135 132*  K 5.5* 5.4* 5.7*  CL 100 101 99  CO2 24  --  22  GLUCOSE 111* 110* 92  BUN 38* 37* 54*  CREATININE 5.97* 6.60* 6.64*  CALCIUM 8.8*  --  8.7*  PHOS  --   --  6.4*    Physical Exam  Blood pressure (!) 163/50, pulse 85, temperature 98.1 F (36.7 C), temperature source Oral, resp. rate (!) 28, height 4\' 11"  (1.499 m), weight 47.2 kg, SpO2 95 %. GEN: NAD, on [place] breathing comfortably ENT: NCAT EYES: EOMI CV: RRR nl s1s2 PULM: CTAB ABD: s/nt/nd SKIN: LUE AVG +B/T, RIJ TDC c/d/i EXT:No LEE NEURO: CN2-12 intact, nonfoncal  Assessment 48F ESRD MWF [facility]  with AHRF / Pulm Edema / Vol O/L, Acute HTN, and mild Hyperkalemia  ESRD: HD today on schedule: 2K, attempt 3L UF HyperK: will address with HD Vol OL / AHRF / Pulm Edema: Needs successful UF and likely lower EDW; also needs afterload reduction with lower BPs.  See how BPs respond to UF, but if stays up would uptitrate current meds and then add ARB, CCB Anemia: Follow Hb, some change since admit no overt bleeding Acute HTN: as above BMD: Ca ok, ? Outpt binder; largely outpt issue Access: maturing AVG LUE, use TDC now   Plan As above  Rexene Agent  04/23/2022, 9:55 AM

## 2022-04-23 NOTE — TOC Progression Note (Signed)
Transition of Care [facility]) - Progression Note    Patient Details  Name: Felicia Williams MRN: [id number] Date of Birth: 08/30/1940  Transition of Care [facility]) CM/SW Contact  Salome Arnt, [place] Phone Number: 04/23/2022, 1:18 PM  Clinical Narrative:   Transition of Care [facility]) Screening Note   Patient Details  Name: [name] Date of Birth: 1941-07-02   Transition of Care [facility]) CM/SW Contact:    Salome Arnt, [place] Phone Number: 04/23/2022, 1:18 PM    Transition of Care Department [facility]) has reviewed patient and no TOC needs have been identified at this time. We will continue to monitor patient advancement through interdisciplinary progression rounds. If new patient transition needs arise, please place a TOC consult.         Barriers to Discharge: Continued Medical Work up  Expected Discharge Plan and Services                                                 Social Determinants of Health (SDOH) Interventions    Readmission Risk Interventions     No data to display

## 2022-04-23 NOTE — Assessment & Plan Note (Signed)
--   continue skin care protocol  ?

## 2022-04-23 NOTE — Progress Notes (Signed)
PROGRESS NOTE   Felicia Williams  JIR:[id number] DOB: [birth year] DOA: 04/22/2022 PCP: [name], NP   Chief Complaint  Patient presents with   Shortness of Breath   Abdominal Pain   Level of care: Telemetry  Brief Admission History:  81 year old female with longstanding history of glomerulonephritis now with end-stage renal disease had been on PD for many years and transition to hemodialysis February 2023.  She is on MWF treatments and last treatment was 2 days ago.  She said that there were some trouble with that last treatment but she does not understand what happened.  She has hypertension, gout, GERD, anxiety depression, hyperlipidemia and anemia.  She reports that for the past week she is having increasing shortness of breath at rest and with lying flat.  She has been having to sleep in a recliner due to shortness of breath.  This continues to worsen.  She denies chest pain.  She denies fever and chills.  She is anuric.  She presented to the emergency department today because she has been having worsening shortness of breath and abdominal pain.  She denies nausea vomiting.  She denies diarrhea.  She was noted to have a markedly elevated cardiac BNP of 4375, elevated troponin at 421, hyperkalemic with a K of 5.5.  Her chest x-ray shows findings of interstitial pulmonary edema and congestive heart failure.  Admission requested for further management.   Assessment and Plan: * Volume overload - Hemodialysis today for volume removal   Hyperkalemia - Treated with hemodialysis, monitor on telemetry  Pressure injury of skin -- continue skin care protocol   Elevated troponin - Demand ischemia due to congestive heart failure, do not suspect ACS  Acute diastolic heart failure ([place]) - Secondary to volume overload, plan to treat with hemodialysis for volume removal as patient is anuric  Generalized weakness - Multifactorial given heart failure presentation and end-stage renal  disease.  Will request PT eval   Gastroesophageal reflux disease - Protonix for GI protection  Restless legs syndrome - Stable  Gout - Reportedly stable no current symptoms  ESRD on dialysis ([place]) - HD today per nephrologist   Chronic cough - Stable, follow-up respiratory panel --> negative   Gastroparesis - Currently stable no symptoms, follow-up with GI outpatient  Anxiety and depression - Reportedly stable  Essential hypertension, benign - Resume home medication   DVT prophylaxis: [place] hep  Code Status: full  Family Communication: daughter 10/2 Disposition: Status is: Inpatient Remains inpatient appropriate because: intensity, may need serial HD treatments, new oxygen requirement being worked up   Consultants:  ESRD Procedures:  Hemodialysis 10/2 Antimicrobials:    Subjective: Pt seen before HD today, she was very short of breath and tachypneic.  Objective: Vitals:   04/23/22 1540 04/23/22 1545 04/23/22 1557 04/23/22 1600  BP: (!) 130/46 (!) 150/49  (!) 151/31  Pulse:  75  82  Resp:  20  (!) 27  Temp:  98.3 F (36.8 C) 98.4 F (36.9 C)   TempSrc:  Oral Oral   SpO2:  97%  92%  Weight:      Height:        Intake/Output Summary (Last 24 hours) at 04/23/2022 1651 Last data filed at 04/23/2022 1545 Gross per 24 hour  Intake 120 ml  Output 2600 ml  Net -2480 ml   Filed Weights   04/22/22 0814 04/22/22 2344 04/23/22 0417  Weight: 45.4 kg 47.8 kg 47.2 kg   Examination:  General exam: Appears calm and  comfortable  Respiratory system: tachypnea. Cardiovascular system: normal S1 & S2 heard. No JVD, murmurs, rubs, gallops or clicks. No pedal edema. Gastrointestinal system: Abdomen is nondistended, soft and nontender. No organomegaly or masses felt. Normal bowel sounds heard. Central nervous system: Alert and oriented. No focal neurological deficits. Extremities: Symmetric 5 x 5 power. Skin: No rashes, lesions or ulcers. Psychiatry: Judgement and insight  appear normal. Mood & affect appropriate.   Data Reviewed: I have personally reviewed following labs and imaging studies  CBC: Recent Labs  Lab 04/22/22 0904 04/22/22 0910 04/23/22 0404  WBC 13.1*  --  8.6  NEUTROABS 11.7*  --   --   HGB 12.2 13.3 10.5*  HCT 37.7 39.0 33.7*  MCV 87.1  --  88.0  PLT 189  --  397    Basic Metabolic Panel: Recent Labs  Lab 04/22/22 0904 04/22/22 0910 04/23/22 0404  NA 135 135 132*  K 5.5* 5.4* 5.7*  CL 100 101 99  CO2 24  --  22  GLUCOSE 111* 110* 92  BUN 38* 37* 54*  CREATININE 5.97* 6.60* 6.64*  CALCIUM 8.8*  --  8.7*  PHOS  --   --  6.4*    CBG: Recent Labs  Lab 04/22/22 2251 04/23/22 1555  GLUCAP 100* 70    Recent Results (from the past 240 hour(s))  Blood culture (routine x 2)     Status: None (Preliminary result)   Collection Time: 04/22/22 10:09 AM   Specimen: BLOOD  Result Value Ref Range Status   Specimen Description   Final    BLOOD RIGHT ANTECUBITAL Performed at [facility], [place] [address] [place], [place] [address]    Special Requests   Final    BOTTLES DRAWN AEROBIC AND ANAEROBIC Blood Culture adequate volume Performed at [facility], [place] [address], [place], [place] [address]    Culture   Final    NO GROWTH < 24 HOURS Performed at [facility], [address] [place], [place] [address]    Report Status PENDING  Incomplete  Blood culture (routine x 2)     Status: None (Preliminary result)   Collection Time: 04/22/22 10:09 AM   Specimen: BLOOD  Result Value Ref Range Status   Specimen Description   Final    BLOOD BLOOD RIGHT HAND Performed at [facility], [place] [address], [place], [place] [address]    Special Requests   Final    BOTTLES DRAWN AEROBIC AND ANAEROBIC Blood Culture adequate volume Performed at [facility], [place] [address], [place], [place] [address]    Culture   Final    NO GROWTH < 24  HOURS Performed at [facility], [address], [place], [place] [address]    Report Status PENDING  Incomplete  Resp Panel by RT-PCR (Flu A&B, Covid) Anterior Nasal Swab     Status: None   Collection Time: 04/22/22 11:11 AM   Specimen: Anterior Nasal Swab  Result Value Ref Range Status   SARS Coronavirus 2 by RT PCR NEGATIVE NEGATIVE Final    Comment: (NOTE) SARS-CoV-2 target nucleic acids are NOT DETECTED.  The SARS-CoV-2 RNA is generally detectable in upper respiratory specimens during the acute phase of infection. The lowest concentration of SARS-CoV-2 viral copies this assay can detect is 138 copies/mL. A negative result does not preclude SARS-Cov-2 infection and should not be used as the sole basis for treatment or other patient management decisions.  A negative result may occur with  improper specimen collection/handling, submission of specimen other than nasopharyngeal swab, presence of viral mutation(s) within the areas targeted by this assay, and inadequate number of viral copies(<138 copies/mL). A negative result must be combined with clinical observations, patient history, and epidemiological information. The expected result is Negative.  Fact Sheet for Patients:  EntrepreneurPulse.com.au  Fact Sheet for Healthcare Providers:  IncredibleEmployment.be  This test is no t yet approved or cleared by the [place] FDA and  has been authorized for detection and/or diagnosis of SARS-CoV-2 by FDA under an Emergency Use Authorization (EUA). This EUA will remain  in effect (meaning this test can be used) for the duration of the COVID-19 declaration under Section 564(b)(1) of the Act, 21 U.S.C.section 360bbb-3(b)(1), unless the authorization is terminated  or revoked sooner.       Influenza A by PCR NEGATIVE NEGATIVE Final   Influenza B by PCR NEGATIVE NEGATIVE Final    Comment: (NOTE) The Xpert Xpress SARS-CoV-2/FLU/RSV plus assay is  intended as an aid in the diagnosis of influenza from Nasopharyngeal swab specimens and should not be used as a sole basis for treatment. Nasal washings and aspirates are unacceptable for Xpert Xpress SARS-CoV-2/FLU/RSV testing.  Fact Sheet for Patients: EntrepreneurPulse.com.au  Fact Sheet for Healthcare Providers: IncredibleEmployment.be  This test is not yet approved or cleared by the [place] FDA and has been authorized for detection and/or diagnosis of SARS-CoV-2 by FDA under an Emergency Use Authorization (EUA). This EUA will remain in effect (meaning this test can be used) for the duration of the COVID-19 declaration under Section 564(b)(1) of the Act, 21 U.S.C. section 360bbb-3(b)(1), unless the authorization is terminated or revoked.  Performed at [facility], [address], [place], [place] [address]   MRSA Next Gen by PCR, Nasal     Status: None   Collection Time: 04/22/22 10:45 PM   Specimen: Nasal Mucosa; Nasal Swab  Result Value Ref Range Status   MRSA by PCR Next Gen NOT DETECTED NOT DETECTED Final    Comment: (NOTE) The GeneXpert MRSA Assay (FDA approved for NASAL specimens only), is one component of a comprehensive MRSA colonization surveillance program. It is not intended to diagnose MRSA infection nor to guide or monitor treatment for MRSA infections. Test performance is not FDA approved in patients less than 36 years old. Performed at [facility], [address], [place],  [address]      Radiology Studies: DG Chest Portable 1 View  Result Date: 04/22/2022 CLINICAL DATA:  Short of breath for 1 week. EXAM: PORTABLE CHEST 1 VIEW COMPARISON:  03/20/2022 and older exams. FINDINGS: Bilateral irregular interstitial thickening. Lung base opacities consistent with a combination of pleural effusions and atelectasis. Cardiac silhouette partly obscured, stable from prior exams. No mediastinal or hilar masses. Right  anterior chest wall tunneled central venous catheter is stable. No pneumothorax. IMPRESSION: 1. Findings consistent with congestive heart failure with interstitial pulmonary edema and new bilateral pleural effusions with associated lung base atelectasis. Electronically Signed   By: Lajean Manes M.D.   On: 04/22/2022 09:01    Scheduled Meds:  carvedilol  12.5 mg Oral BID   Chlorhexidine Gluconate Cloth  6 each Topical Q0600   heparin  2,000 Units Dialysis Once in dialysis   heparin  5,000 Units Subcutaneous Q8H   heparin sodium (porcine)       hydrALAZINE  50 mg Oral BID   lidocaine  1 patch Transdermal Q24H   Continuous Infusions:  LOS: 0 days   Time spent: 35 mins  Felicia Moller Wynetta Emery, MD How to contact the [facility] Attending or Consulting provider [place] or covering provider during after hours [place], for this patient?  Check the care team in [facility] and look for a) attending/consulting TR[facility] provider listed and b) the [facility] team listed Log into [email or web address] and use [place]'s universal password to access. If you do not have the password, please contact the hospital operator. Locate the [facility] provider you are looking for under [facility] and page to a number that you can be directly reached. If you still have difficulty reaching the provider, please page the [facility] (Director on Call) for the Hospitalists listed on amion for assistance.  04/23/2022, 4:51 PM

## 2022-04-24 ENCOUNTER — Inpatient Hospital Stay (HOSPITAL_COMMUNITY): Payer: Medicare Other

## 2022-04-24 DIAGNOSIS — E8779 Other fluid overload: Secondary | ICD-10-CM | POA: Diagnosis not present

## 2022-04-24 DIAGNOSIS — R053 Chronic cough: Secondary | ICD-10-CM | POA: Diagnosis not present

## 2022-04-24 DIAGNOSIS — I5031 Acute diastolic (congestive) heart failure: Secondary | ICD-10-CM | POA: Diagnosis not present

## 2022-04-24 DIAGNOSIS — R7989 Other specified abnormal findings of blood chemistry: Secondary | ICD-10-CM | POA: Diagnosis not present

## 2022-04-24 LAB — RENAL FUNCTION PANEL
Albumin: 2.8 g/dL — ABNORMAL LOW (ref 3.5–5.0)
Anion gap: 14 (ref 5–15)
BUN: 34 mg/dL — ABNORMAL HIGH (ref 8–23)
CO2: 25 mmol/L (ref 22–32)
Calcium: 8.5 mg/dL — ABNORMAL LOW (ref 8.9–10.3)
Chloride: 96 mmol/L — ABNORMAL LOW (ref 98–111)
Creatinine, Ser: 4.72 mg/dL — ABNORMAL HIGH (ref 0.44–1.00)
GFR, Estimated: 9 mL/min — ABNORMAL LOW (ref >60)
Glucose, Bld: 71 mg/dL (ref 70–99)
Phosphorus: 5.3 mg/dL — ABNORMAL HIGH (ref 2.5–4.6)
Potassium: 4.9 mmol/L (ref 3.5–5.1)
Sodium: 135 mmol/L (ref 135–145)

## 2022-04-24 LAB — CBC
HCT: 35.4 % — ABNORMAL LOW (ref 36.0–46.0)
Hemoglobin: 10.9 g/dL — ABNORMAL LOW (ref 12.0–15.0)
MCH: 27.7 pg (ref 26.0–34.0)
MCHC: 30.8 g/dL (ref 30.0–36.0)
MCV: 90.1 fL (ref 80.0–100.0)
Platelets: 195 10*3/uL (ref 150–400)
RBC: 3.93 MIL/uL (ref 3.87–5.11)
RDW: 18.9 % — ABNORMAL HIGH (ref 11.5–15.5)
WBC: 10.9 10*3/uL — ABNORMAL HIGH (ref 4.0–10.5)
nRBC: 0 % (ref 0.0–0.2)

## 2022-04-24 MED ORDER — CHLORHEXIDINE GLUCONATE CLOTH 2 % EX PADS
6.0000 | MEDICATED_PAD | Freq: Every day | CUTANEOUS | Status: DC
  Administered 2022-04-25: 6 via TOPICAL

## 2022-04-24 MED ORDER — DOXYCYCLINE HYCLATE 100 MG PO TABS
100.0000 mg | ORAL_TABLET | Freq: Two times a day (BID) | ORAL | Status: DC
  Administered 2022-04-24 – 2022-04-26 (×5): 100 mg via ORAL
  Filled 2022-04-24 (×5): qty 1

## 2022-04-24 MED ORDER — CARVEDILOL 3.125 MG PO TABS
6.2500 mg | ORAL_TABLET | Freq: Two times a day (BID) | ORAL | Status: DC
  Administered 2022-04-24 – 2022-04-26 (×4): 6.25 mg via ORAL
  Filled 2022-04-24 (×4): qty 2

## 2022-04-24 MED ORDER — LIVING BETTER WITH HEART FAILURE BOOK: Freq: Once | Status: AC

## 2022-04-24 NOTE — Progress Notes (Signed)
Subjective:  Had HD yesterday -  removed 2.6 liters but did drop BP once-  still on 2 L this AM BP is back up-- pre HD weight 47.2  ( EDW is 46)    Objective Vital signs in last 24 hours: Vitals:   04/23/22 1600 04/23/22 1846 04/23/22 2308 04/24/22 0516  BP: (!) 151/31 (!) 127/44 130/60 (!) 166/61  Pulse: 82 77 84 84  Resp: (!) 27 20 16 20   Temp:  99.6 F (37.6 C) 99.2 F (37.3 C) 99.5 F (37.5 C)  TempSrc:  Oral Oral Oral  SpO2: 92% 92% 90% 93%  Weight:      Height:       Weight change:   Intake/Output Summary (Last 24 hours) at 04/24/2022 1034 Last data filed at 04/24/2022 0900 Gross per 24 hour  Intake 200 ml  Output 2600 ml  Net -2400 ml   [name] MWF 3.5 hours-  EDW 46 kg, 3K/2.5 calc, TDC 400 BFR . 500 DFR. Heparin 1200 per hour, mircera 75 q 2 weeks and venofer currently 100 per tx  ( has aVG placed 8/29)    Assessment/ Plan: Pt is a 81 y.o. yo female  with ESRD who was admitted on 04/22/2022 with  progressive dyspnea  -  x-ray c/w volume overload Assessment/Plan: 1. SOB- chest x-ray c/w pulmonary edema and pleural effusions -  did not feel better after HD yesterday-  removed only 2.7 but is a small lady so that is significant UF.  Will try again to challenge with HD tomorrow-  if not better would she benefit from a thoracentesis ??  2. ESRD -  normally MWF as OP via TDC but AVG is very close-  plan to challenge tomorrow with HD  3. Anemia-  not a major issue at this point 4. Secondary hyperparathyroidism-  is not on vit D or binder as OP ?  5. HTN/volume-  WIll try to hold hydralazine and lower coreg to be able to UF more successfully tomorrow   Louis Meckel    Labs: Basic Metabolic Panel: Recent Labs  Lab 04/22/22 0904 04/22/22 0910 04/23/22 0404 04/24/22 0351  NA 135 135 132* 135  K 5.5* 5.4* 5.7* 4.9  CL 100 101 99 96*  CO2 24  --  22 25  GLUCOSE 111* 110* 92 71  BUN 38* 37* 54* 34*  CREATININE 5.97* 6.60* 6.64* 4.72*  CALCIUM 8.8*  --   8.7* 8.5*  PHOS  --   --  6.4* 5.3*   Liver Function Tests: Recent Labs  Lab 04/22/22 0904 04/23/22 0404 04/24/22 0351  AST 22  --   --   ALT 13  --   --   ALKPHOS 70  --   --   BILITOT 1.0  --   --   PROT 6.6  --   --   ALBUMIN 3.3* 2.9* 2.8*   Recent Labs  Lab 04/22/22 0904  LIPASE 37   No results for input(s): "AMMONIA" in the last 168 hours. CBC: Recent Labs  Lab 04/22/22 0904 04/22/22 0910 04/23/22 0404 04/24/22 0351  WBC 13.1*  --  8.6 10.9*  NEUTROABS 11.7*  --   --   --   HGB 12.2 13.3 10.5* 10.9*  HCT 37.7 39.0 33.7* 35.4*  MCV 87.1  --  88.0 90.1  PLT 189  --  180 195   Cardiac Enzymes: No results for input(s): "CKTOTAL", "CKMB", "CKMBINDEX", "TROPONINI" in the last 168 hours. CBG:  Recent Labs  Lab 04/22/22 2251 04/23/22 1555  GLUCAP 100* 70    Iron Studies: No results for input(s): "IRON", "TIBC", "TRANSFERRIN", "FERRITIN" in the last 72 hours. Studies/Results: DG CHEST PORT 1 VIEW  Result Date: 04/24/2022 CLINICAL DATA:  [id number], [id number]. Hypoxic respiratory failure CHF. Dialysis patient. EXAM: PORTABLE CHEST 1 VIEW COMPARISON:  Portable chest 04/22/2022 FINDINGS: 4:59 a.m. right IJ double-lumen dialysis catheter again terminates in the right atrium. The heart is enlarged. There is increased perihilar vascular congestion and perihilar edema is also increased extending outward. There are worsening opacities of the mid to lower lung fields with a basal gradient and increased moderate pleural effusions. The lung opacities could be all due to alveolar edema or could be a combination of edema and pneumonia. The apical lungs remain clear. There is a stable mediastinum with aortic calcification and tortuosity. Osteopenia and thoracic levoscoliosis. IMPRESSION: Worsening perihilar vascular congestion and perihilar edema radiating outward. Increased opacities of mid to lower lung fields could be due to alveolar edema, pneumonia or combination. Increased moderate  pleural effusions. Aortic atherosclerosis. Electronically Signed   By: Telford Nab M.D.   On: 04/24/2022 05:24   Medications: Infusions:   Scheduled Medications:  carvedilol  12.5 mg Oral BID   Chlorhexidine Gluconate Cloth  6 each Topical Q0600   doxycycline  100 mg Oral Q12H   heparin  2,000 Units Dialysis Once in dialysis   heparin  5,000 Units Subcutaneous Q8H   hydrALAZINE  50 mg Oral BID   lidocaine  1 patch Transdermal Q24H    have reviewed scheduled and prn medications.  Physical Exam: General:  does not appear to be in too much distress but says is still quite SOB Heart: RRR Lungs:  deep breaths bring on coughing  Abdomen: soft, non tender Extremities: really no peripheral edema  Dialysis Access: TDC and AVG on left     04/24/2022,10:34 AM  LOS: 1 day

## 2022-04-24 NOTE — Progress Notes (Signed)
PROGRESS NOTE   Felicia Williams  NLZ:[id number] DOB: 1941-05-28 DOA: 04/22/2022 PCP: [name], NP   Chief Complaint  Patient presents with   Shortness of Breath   Abdominal Pain   Level of care: Telemetry  Brief Admission History:  81 year old female with longstanding history of glomerulonephritis now with end-stage renal disease had been on PD for many years and transition to hemodialysis February 2023.  She is on MWF treatments and last treatment was 2 days ago.  She said that there were some trouble with that last treatment but she does not understand what happened.  She has hypertension, gout, GERD, anxiety depression, hyperlipidemia and anemia.  She reports that for the past week she is having increasing shortness of breath at rest and with lying flat.  She has been having to sleep in a recliner due to shortness of breath.  This continues to worsen.  She denies chest pain.  She denies fever and chills.  She is anuric.  She presented to the emergency department today because she has been having worsening shortness of breath and abdominal pain.  She denies nausea vomiting.  She denies diarrhea.  She was noted to have a markedly elevated cardiac BNP of 4375, elevated troponin at 421, hyperkalemic with a K of 5.5.  Her chest x-ray shows findings of interstitial pulmonary edema and congestive heart failure.  Admission requested for further management.   Assessment and Plan: * Volume overload -  Unfortunately remains volume overloaded in chest. Today's chest xray appears worse.  Nephrology planning additional HD for volume removal as BP tolerates.    Hyperkalemia - Treated with hemodialysis and resolved now  Pressure injury of skin -- continue skin care protocol   Elevated troponin - Demand ischemia due to congestive heart failure, do not suspect ACS  Acute diastolic heart failure ([place]) - Secondary to volume overload, plan to treat with hemodialysis for volume removal as  patient is anuric -- pt remains volume overloaded since HD treatment 10/3 -- nephrology planning for additional HD treatments  -- CXR appears slightly worse today  Generalized weakness - Multifactorial given heart failure presentation and end-stage renal disease.  Will request PT eval  -- volume overload in chest not yet controlled, continue treatments -- added doxycycline to cover possible pneumonia hidden by all the fluid in the chest   Gastroesophageal reflux disease - Protonix for GI protection  Restless legs syndrome - Stable  Gout - Reportedly stable no current symptoms  ESRD on dialysis ([place]) - extra HD treatments per nephrologist for volume removal  Chronic cough - Stable, follow-up respiratory panel --> negative  -- CXR concerning; excess fluid may be hiding a pneumonia -- started doxycycline 100 mg BID on 10/3   Gastroparesis - Currently stable no symptoms, follow-up with GI outpatient  Anxiety and depression - Reportedly stable  Essential hypertension, benign - Resumed home medication   DVT prophylaxis: [place] hep  Code Status: full  Family Communication: daughter 10/2 Disposition: Status is: Inpatient Remains inpatient appropriate because: intensity, may need serial HD treatments, new oxygen requirement being worked up   Consultants:  ESRD Procedures:  Hemodialysis 10/2 Antimicrobials:    Subjective: Pt still feels pretty short of breath and remains on supplemental oxygen which is a new requirement for her  Objective: Vitals:   04/23/22 1846 04/23/22 2308 04/24/22 0516 04/24/22 1141  BP: (!) 127/44 130/60 (!) 166/61 (!) 128/47  Pulse: 77 84 84 73  Resp: 20 16 20    Temp: 99.6  F (37.6 C) 99.2 F (37.3 C) 99.5 F (37.5 C)   TempSrc: Oral Oral Oral   SpO2: 92% 90% 93% 94%  Weight:      Height:        Intake/Output Summary (Last 24 hours) at 04/24/2022 1409 Last data filed at 04/24/2022 1246 Gross per 24 hour  Intake 440 ml  Output 2600 ml   Net -2160 ml   Filed Weights   04/22/22 0814 04/22/22 2344 04/23/22 0417  Weight: 45.4 kg 47.8 kg 47.2 kg   Examination:  General exam: Appears calm and comfortable  Respiratory system: tachypnea. Crackles heard diffusely, posterior rales on right side Cardiovascular system: normal S1 & S2 heard. No JVD, murmurs, rubs, gallops or clicks. No pedal edema. Gastrointestinal system: Abdomen is nondistended, soft and nontender. No organomegaly or masses felt. Normal bowel sounds heard. Central nervous system: Alert and oriented. No focal neurological deficits. Extremities: Symmetric 5 x 5 power. Skin: No rashes, lesions or ulcers. Psychiatry: Judgement and insight appear normal. Mood & affect appropriate.   Data Reviewed: I have personally reviewed following labs and imaging studies  CBC: Recent Labs  Lab 04/22/22 0904 04/22/22 0910 04/23/22 0404 04/24/22 0351  WBC 13.1*  --  8.6 10.9*  NEUTROABS 11.7*  --   --   --   HGB 12.2 13.3 10.5* 10.9*  HCT 37.7 39.0 33.7* 35.4*  MCV 87.1  --  88.0 90.1  PLT 189  --  180 237    Basic Metabolic Panel: Recent Labs  Lab 04/22/22 0904 04/22/22 0910 04/23/22 0404 04/24/22 0351  NA 135 135 132* 135  K 5.5* 5.4* 5.7* 4.9  CL 100 101 99 96*  CO2 24  --  22 25  GLUCOSE 111* 110* 92 71  BUN 38* 37* 54* 34*  CREATININE 5.97* 6.60* 6.64* 4.72*  CALCIUM 8.8*  --  8.7* 8.5*  PHOS  --   --  6.4* 5.3*    CBG: Recent Labs  Lab 04/22/22 2251 04/23/22 1555  GLUCAP 100* 70    Recent Results (from the past 240 hour(s))  Blood culture (routine x 2)     Status: None (Preliminary result)   Collection Time: 04/22/22 10:09 AM   Specimen: BLOOD  Result Value Ref Range Status   Specimen Description   Final    BLOOD RIGHT ANTECUBITAL Performed at [facility], [place] [address] [place], [place] [address]    Special Requests   Final    BOTTLES DRAWN AEROBIC AND ANAEROBIC Blood Culture adequate volume Performed at  [facility], [place] [address], [place], [place] [address]    Culture   Final    NO GROWTH 2 DAYS Performed at [facility], [address], [place], [place] [address]    Report Status PENDING  Incomplete  Blood culture (routine x 2)     Status: None (Preliminary result)   Collection Time: 04/22/22 10:09 AM   Specimen: BLOOD  Result Value Ref Range Status   Specimen Description   Final    BLOOD BLOOD RIGHT HAND Performed at [facility], [place] [address], [place], [place] [address]    Special Requests   Final    BOTTLES DRAWN AEROBIC AND ANAEROBIC Blood Culture adequate volume Performed at [facility], [place] [address], [place],  [address]    Culture   Final    NO GROWTH 2 DAYS Performed at [facility], [address],  [place], [place] [address]    Report Status PENDING  Incomplete  Resp Panel by RT-PCR (Flu A&B, Covid) Anterior Nasal Swab     Status: None   Collection Time: 04/22/22 11:11 AM   Specimen: Anterior Nasal Swab  Result Value Ref Range Status   SARS Coronavirus 2 by RT PCR NEGATIVE NEGATIVE Final    Comment: (NOTE) SARS-CoV-2 target nucleic acids are NOT DETECTED.  The SARS-CoV-2 RNA is generally detectable in upper respiratory specimens during the acute phase of infection. The lowest concentration of SARS-CoV-2 viral copies this assay can detect is 138 copies/mL. A negative result does not preclude SARS-Cov-2 infection and should not be used as the sole basis for treatment or other patient management decisions. A negative result may occur with  improper specimen collection/handling, submission of specimen other than nasopharyngeal swab, presence of viral mutation(s) within the areas targeted by this assay, and inadequate number of viral copies(<138 copies/mL). A negative result must be combined with clinical observations, patient history, and epidemiological information. The expected  result is Negative.  Fact Sheet for Patients:  EntrepreneurPulse.com.au  Fact Sheet for Healthcare Providers:  IncredibleEmployment.be  This test is no t yet approved or cleared by the [place] FDA and  has been authorized for detection and/or diagnosis of SARS-CoV-2 by FDA under an Emergency Use Authorization (EUA). This EUA will remain  in effect (meaning this test can be used) for the duration of the COVID-19 declaration under Section 564(b)(1) of the Act, 21 U.S.C.section 360bbb-3(b)(1), unless the authorization is terminated  or revoked sooner.       Influenza A by PCR NEGATIVE NEGATIVE Final   Influenza B by PCR NEGATIVE NEGATIVE Final    Comment: (NOTE) The Xpert Xpress SARS-CoV-2/FLU/RSV plus assay is intended as an aid in the diagnosis of influenza from Nasopharyngeal swab specimens and should not be used as a sole basis for treatment. Nasal washings and aspirates are unacceptable for Xpert Xpress SARS-CoV-2/FLU/RSV testing.  Fact Sheet for Patients: EntrepreneurPulse.com.au  Fact Sheet for Healthcare Providers: IncredibleEmployment.be  This test is not yet approved or cleared by the [place] FDA and has been authorized for detection and/or diagnosis of SARS-CoV-2 by FDA under an Emergency Use Authorization (EUA). This EUA will remain in effect (meaning this test can be used) for the duration of the COVID-19 declaration under Section 564(b)(1) of the Act, 21 U.S.C. section 360bbb-3(b)(1), unless the authorization is terminated or revoked.  Performed at [facility], [address] [place], [place] [address]   MRSA Next Gen by PCR, Nasal     Status: None   Collection Time: 04/22/22 10:45 PM   Specimen: Nasal Mucosa; Nasal Swab  Result Value Ref Range Status   MRSA by PCR Next Gen NOT DETECTED NOT DETECTED Final    Comment: (NOTE) The GeneXpert MRSA Assay (FDA approved for NASAL  specimens only), is one component of a comprehensive MRSA colonization surveillance program. It is not intended to diagnose MRSA infection nor to guide or monitor treatment for MRSA infections. Test performance is not FDA approved in patients less than 65 years old. Performed at [facility], [address], [place],  [address]      Radiology Studies: DG CHEST PORT 1 VIEW  Result Date: 04/24/2022 CLINICAL DATA:  [id number], [phone]. Hypoxic respiratory failure CHF. Dialysis patient. EXAM: PORTABLE CHEST 1 VIEW COMPARISON:  Portable chest 04/22/2022 FINDINGS: 4:59 a.m. right IJ double-lumen dialysis catheter again terminates in the right atrium. The heart is enlarged. There is increased  perihilar vascular congestion and perihilar edema is also increased extending outward. There are worsening opacities of the mid to lower lung fields with a basal gradient and increased moderate pleural effusions. The lung opacities could be all due to alveolar edema or could be a combination of edema and pneumonia. The apical lungs remain clear. There is a stable mediastinum with aortic calcification and tortuosity. Osteopenia and thoracic levoscoliosis. IMPRESSION: Worsening perihilar vascular congestion and perihilar edema radiating outward. Increased opacities of mid to lower lung fields could be due to alveolar edema, pneumonia or combination. Increased moderate pleural effusions. Aortic atherosclerosis. Electronically Signed   By: Telford Nab M.D.   On: 04/24/2022 05:24    Scheduled Meds:  carvedilol  6.25 mg Oral BID   Chlorhexidine Gluconate Cloth  6 each Topical Q0600   [START ON 04/25/2022] Chlorhexidine Gluconate Cloth  6 each Topical Q0600   doxycycline  100 mg Oral Q12H   heparin  2,000 Units Dialysis Once in dialysis   heparin  5,000 Units Subcutaneous Q8H   lidocaine  1 patch Transdermal Q24H   Living Better with Heart Failure Book   Does not apply Once   Continuous Infusions:   LOS: 1 day    Time spent: 35 mins  Dawson Albers Wynetta Emery, MD How to contact the [facility] Attending or Consulting provider [place] or covering provider during after hours [place], for this patient?  Check the care team in [facility] and look for a) attending/consulting TRH provider listed and b) the [facility] team listed Log into [email or web address] and use [place]'s universal password to access. If you do not have the password, please contact the hospital operator. Locate the [facility] provider you are looking for under [facility] and page to a number that you can be directly reached. If you still have difficulty reaching the provider, please page the [facility] (Director on Call) for the Hospitalists listed on amion for assistance.  04/24/2022, 2:09 PM

## 2022-04-24 NOTE — TOC Initial Note (Addendum)
Transition of Care [facility]) - Initial/Assessment Note    Patient Details  Name: Felicia Williams MRN: [id number] Date of Birth: 1940-12-07  Transition of Care [facility]) CM/SW Contact:    Ihor Gully, LCSW Phone Number: 04/24/2022, 1:42 PM  Clinical Narrative:                 Patient from home. Admitted for volume overload. Patient on HD MWF at [place]. Considered high risk for readmission. TOC following and will address needs as they arise.   Expected Discharge Plan: Home/Self Care Barriers to Discharge: Continued Medical Work up   Patient Goals and CMS Choice        Expected Discharge Plan and Services Expected Discharge Plan: Home/Self Care                                              Prior Living Arrangements/Services                       Activities of Daily Living Home Assistive Devices/Equipment: Environmental consultant (specify type), Eyeglasses ADL Screening (condition at time of admission) Patient's cognitive ability adequate to safely complete daily activities?: No Is the patient deaf or have difficulty hearing?: No Does the patient have difficulty seeing, even when wearing glasses/contacts?: No Does the patient have difficulty concentrating, remembering, or making decisions?: No Patient able to express need for assistance with ADLs?: No Does the patient have difficulty dressing or bathing?: No Independently performs ADLs?: Yes (appropriate for developmental age) Does the patient have difficulty walking or climbing stairs?: No Weakness of Legs: Both Weakness of Arms/Hands: Both  Permission Sought/Granted                  Emotional Assessment              Admission diagnosis:  Acute pulmonary edema ([place]) [J81.0] Pleural effusion [J90] Hypoxia [R09.02] Elevated troponin [R79.89] Volume overload [E87.70] Dyspnea, unspecified type [R06.00] Patient Active Problem List   Diagnosis Date Noted   Pressure injury of skin 04/23/2022   Volume  overload 19/37/9024   Acute diastolic heart failure ([place]) 04/22/2022   Elevated troponin 04/22/2022   Hyperkalemia 02/28/2022   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   Anorexia 11/28/2021   Weight loss 11/28/2021   Toxic encephalopathy 09/11/2021   Malnutrition of moderate degree 09/10/2021   Generalized weakness 09/07/2021   Debility 09/07/2021   Diarrhea 09/06/2021   Sepsis due to peritonitis associated with peritoneal dialysis ([place]) 08/30/2021   Pruritus 08/28/2021   ESRD on dialysis ([place]) 08/26/2021   Age-related osteoporosis without current pathological fracture 08/26/2021   Body mass index (BMI) 22.0-22.9, adult 08/26/2021   Cramp in limb 08/26/2021   Epistaxis 08/26/2021   Iron deficiency anemia 08/26/2021   Localized, 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   Overactive bladder 08/26/2021   Restless legs syndrome 08/26/2021   Sciatica 08/26/2021   Stage 3 chronic kidney disease ([place]) 08/26/2021   Vitamin D deficiency 08/26/2021   Panic disorder 08/26/2021   Gastroesophageal reflux disease 08/26/2021   Sepsis ([place]) 08/26/2021   Gastroparesis 05/23/2021   Chronic cough 05/23/2021   Renal failure 01/15/2019   ARF (acute renal failure) ([place]) 01/07/2019   Glomerulonephritis 01/07/2019   Anxiety and depression 01/07/2019   Psoriasis 01/07/2019   Tachycardia, unspecified 01/07/2019   Macrocytic anemia 08/18/2018   Proteinuria 09/04/2017   Essential  hypertension, benign 01/26/2014   Palpitations 01/26/2014   Fatigue due to depression 01/26/2014   Gout 07/30/2012   Mixed hyperlipidemia 10/01/2011   Depression 10/01/2011   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

## 2022-04-25 ENCOUNTER — Encounter: Payer: Medicare Other | Admitting: Vascular Surgery

## 2022-04-25 DIAGNOSIS — J9601 Acute respiratory failure with hypoxia: Secondary | ICD-10-CM

## 2022-04-25 DIAGNOSIS — J810 Acute pulmonary edema: Secondary | ICD-10-CM

## 2022-04-25 LAB — CBC
HCT: 32.3 % — ABNORMAL LOW (ref 36.0–46.0)
Hemoglobin: 10.4 g/dL — ABNORMAL LOW (ref 12.0–15.0)
MCH: 27.8 pg (ref 26.0–34.0)
MCHC: 32.2 g/dL (ref 30.0–36.0)
MCV: 86.4 fL (ref 80.0–100.0)
Platelets: 226 10*3/uL (ref 150–400)
RBC: 3.74 MIL/uL — ABNORMAL LOW (ref 3.87–5.11)
RDW: 18.7 % — ABNORMAL HIGH (ref 11.5–15.5)
WBC: 8.5 10*3/uL (ref 4.0–10.5)
nRBC: 0 % (ref 0.0–0.2)

## 2022-04-25 LAB — RENAL FUNCTION PANEL
Albumin: 2.7 g/dL — ABNORMAL LOW (ref 3.5–5.0)
Anion gap: 16 — ABNORMAL HIGH (ref 5–15)
BUN: 61 mg/dL — ABNORMAL HIGH (ref 8–23)
CO2: 25 mmol/L (ref 22–32)
Calcium: 8.7 mg/dL — ABNORMAL LOW (ref 8.9–10.3)
Chloride: 95 mmol/L — ABNORMAL LOW (ref 98–111)
Creatinine, Ser: 6.66 mg/dL — ABNORMAL HIGH (ref 0.44–1.00)
GFR, Estimated: 6 mL/min — ABNORMAL LOW (ref >60)
Glucose, Bld: 96 mg/dL (ref 70–99)
Phosphorus: 6.6 mg/dL — ABNORMAL HIGH (ref 2.5–4.6)
Potassium: 5 mmol/L (ref 3.5–5.1)
Sodium: 136 mmol/L (ref 135–145)

## 2022-04-25 NOTE — Progress Notes (Signed)
Received patient in bed to unit.  Alert and oriented.  Informed consent signed and in chart.   Treatment initiated: 0940 Treatment completed: 1310  Patient tolerated well.  Transported back to the room  Alert, without acute distress.  Hand-off given to patient's nurse.   Access used: catheter Access issues: none  Total UF removed: 2.5 L Medication(s) given: none Post HD VS: 129/50 p 80 R 20 Post HD weight: 45kg   [facility]

## 2022-04-25 NOTE — Progress Notes (Signed)
Patient slept through the nigh no complaints of pain. Continued to monitor.

## 2022-04-25 NOTE — Procedures (Signed)
I was present at this dialysis session. I have reviewed the session itself and made appropriate changes.  She reports breathing is better today.  Tolerating HD and UF.  Continue with HD on MWF schedule while she remains an inpatient.   Vital signs in last 24 hours:  Temp:  [97.1 F (36.2 C)-98 F (36.7 C)] 98 F (36.7 C) (10/04 0940) Pulse Rate:  [73-83] 80 (10/04 1000) Resp:  [18-19] 18 (10/04 1000) BP: (128-167)/(47-57) 158/51 (10/04 1000) SpO2:  [94 %-100 %] 97 % (10/04 1000) Weight:  [47 kg] 47 kg (10/04 1006) Weight change:  Filed Weights   04/22/22 2344 04/23/22 0417 04/25/22 1006  Weight: 47.8 kg 47.2 kg 47 kg    Recent Labs  Lab 04/25/22 0324  NA 136  K 5.0  CL 95*  CO2 25  GLUCOSE 96  BUN 61*  CREATININE 6.66*  CALCIUM 8.7*  PHOS 6.6*    Recent Labs  Lab 04/22/22 0904 04/22/22 0910 04/23/22 0404 04/24/22 0351 04/25/22 0324  WBC 13.1*  --  8.6 10.9* 8.5  NEUTROABS 11.7*  --   --   --   --   HGB 12.2   < > 10.5* 10.9* 10.4*  HCT 37.7   < > 33.7* 35.4* 32.3*  MCV 87.1  --  88.0 90.1 86.4  PLT 189  --  180 195 226   < > = values in this interval not displayed.    Scheduled Meds:  carvedilol  6.25 mg Oral BID   Chlorhexidine Gluconate Cloth  6 each Topical Q0600   Chlorhexidine Gluconate Cloth  6 each Topical Q0600   doxycycline  100 mg Oral Q12H   heparin  2,000 Units Dialysis Once in dialysis   heparin  5,000 Units Subcutaneous Q8H   lidocaine  1 patch Transdermal Q24H   Continuous Infusions: PRN Meds:.acetaminophen **OR** acetaminophen, alteplase, bisacodyl, heparin, hydrALAZINE, HYDROcodone-acetaminophen, ondansetron **OR** ondansetron (ZOFRAN) IV, traZODone   [name],  MD 04/25/2022, 10:25 AM

## 2022-04-25 NOTE — Plan of Care (Signed)

## 2022-04-25 NOTE — Progress Notes (Signed)
PROGRESS NOTE  Felicia Williams ZOX:[id number] DOB: [birth year] DOA: 04/22/2022 PCP: [name], NP  Brief History:  81 year old female with longstanding history of glomerulonephritis now with end-stage renal disease had been on PD for many years and transition to hemodialysis February 2023.  She is on MWF treatments and last treatment was 2 days ago.  She said that there were some trouble with that last treatment but she does not understand what happened.  She has hypertension, gout, GERD, anxiety depression, hyperlipidemia and anemia.  She reports that for the past week she is having increasing shortness of breath at rest and with lying flat.  She has been having to sleep in a recliner due to shortness of breath.  This continues to worsen.  She denies chest pain.  She denies fever and chills.  She is anuric.  She presented to the emergency department today because she has been having worsening shortness of breath and abdominal pain.  She denies nausea vomiting.  She denies diarrhea.  She was noted to have a markedly elevated cardiac BNP of 4375, elevated troponin at 421, hyperkalemic with a K of 5.5.  Her chest x-ray shows findings of interstitial pulmonary edema and congestive heart failure.  Admission requested for further management.     Assessment and Plan: * Volume overload -  Unfortunately remains volume overloaded -due to indiscretion with fluid intake at home -10/2--UF 2.6L -10/4-UF 2.5L   -1200 cc fluid restrict every 24 hours  Acute respiratory failure with hypoxia (HCC) Due to pulmonary edema Presented with tachypnea and saturation 90% on RA Wean off oxygen for saturation > 92%  ESRD on dialysis ([place]) - extra HD treatments per nephrologist for volume removal -appreciate nephrology  Elevated troponin - Demand ischemia due to fluid overload and ESRD, do not suspect ACS  Essential hypertension, benign - Resumed home coreg at lower dose - holding  hydralazine  Pressure injury of skin -- continue skin care protocol   Hyperkalemia - Treated with hemodialysis and resolved now  Generalized weakness - Multifactorial given heart failure presentation and end-stage renal disease.  Will request PT eval  -- volume overload in chest not yet controlled, continue treatments  Gout - Reportedly stable no current symptoms  Chronic cough - Stable, follow-up respiratory panel --> negative  -- CXR concerning; excess fluid may be hiding a pneumonia?? -- started doxycycline 100 mg BID on 10/3  - check PCT  Gastroparesis - Currently stable no symptoms, follow-up with GI outpatient     Family Communication:   Family updated 10/4  Consultants:  renal  Code Status:  FULL   DVT Prophylaxis:  [place] Heparin    Procedures: As Listed in Progress Note Above  Antibiotics: Doxy 10/3>>  RN Pressure Injury Documentation: Pressure Injury 04/22/22 Sacrum Right Stage 1 -  Intact skin with non-blanchable redness of a localized area usually over a bony prominence. (Active)  04/22/22 2330  Location: Sacrum  Location Orientation: Right  Staging: Stage 1 -  Intact skin with non-blanchable redness of a localized area usually over a bony prominence.  Wound Description (Comments):   Present on Admission: Yes  Dressing Type Foam - Lift dressing to assess site every shift 04/25/22 0820        Subjective: Patient denies fevers, chills, headache, chest pain, dyspnea, nausea, vomiting, diarrhea, abdominal pain, dysuria, hematuria, hematochezia, and melena.   Objective: Vitals:   04/25/22 1200 04/25/22 1230 04/25/22 1300 04/25/22 1310  BP: (!) 140/55 (!) 122/48 (!) 109/42 (!) 129/50  Pulse: 80 84 88 80  Resp: 18 16 20 20   Temp:    98 F (36.7 C)  TempSrc:    Oral  SpO2: 98% 100% 99% 96%  Weight:      Height:        Intake/Output Summary (Last 24 hours) at 04/25/2022 1739 Last data filed at 04/25/2022 1310 Gross per 24 hour  Intake 660 ml   Output 2500 ml  Net -1840 ml   Weight change:  Exam:  General:  Pt is alert, follows commands appropriately, not in acute distress HEENT: No icterus, No thrush, No neck mass, [place]/AT Cardiovascular: RRR, S1/S2, no rubs, no gallops Respiratory: bibasilar crackles. No wheeze Abdomen: Soft/+BS, non tender, non distended, no guarding Extremities: No edema, No lymphangitis, No petechiae, No rashes, no synovitis   Data Reviewed: I have personally reviewed following labs and imaging studies Basic Metabolic Panel: Recent Labs  Lab 04/22/22 0904 04/22/22 0910 04/23/22 0404 04/24/22 0351 04/25/22 0324  NA 135 135 132* 135 136  K 5.5* 5.4* 5.7* 4.9 5.0  CL 100 101 99 96* 95*  CO2 24  --  22 25 25   GLUCOSE 111* 110* 92 71 96  BUN 38* 37* 54* 34* 61*  CREATININE 5.97* 6.60* 6.64* 4.72* 6.66*  CALCIUM 8.8*  --  8.7* 8.5* 8.7*  PHOS  --   --  6.4* 5.3* 6.6*   Liver Function Tests: Recent Labs  Lab 04/22/22 0904 04/23/22 0404 04/24/22 0351 04/25/22 0324  AST 22  --   --   --   ALT 13  --   --   --   ALKPHOS 70  --   --   --   BILITOT 1.0  --   --   --   PROT 6.6  --   --   --   ALBUMIN 3.3* 2.9* 2.8* 2.7*   Recent Labs  Lab 04/22/22 0904  LIPASE 37   No results for input(s): "AMMONIA" in the last 168 hours. Coagulation Profile: Recent Labs  Lab 04/22/22 0904  INR 1.1   CBC: Recent Labs  Lab 04/22/22 0904 04/22/22 0910 04/23/22 0404 04/24/22 0351 04/25/22 0324  WBC 13.1*  --  8.6 10.9* 8.5  NEUTROABS 11.7*  --   --   --   --   HGB 12.2 13.3 10.5* 10.9* 10.4*  HCT 37.7 39.0 33.7* 35.4* 32.3*  MCV 87.1  --  88.0 90.1 86.4  PLT 189  --  180 195 226   Cardiac Enzymes: No results for input(s): "CKTOTAL", "CKMB", "CKMBINDEX", "TROPONINI" in the last 168 hours. BNP: Invalid input(s): "POCBNP" CBG: Recent Labs  Lab 04/22/22 2251 04/23/22 1555  GLUCAP 100* 70   HbA1C: No results for input(s): "HGBA1C" in the last 72 hours. Urine analysis:    Component  Value Date/Time   COLORURINE COLORLESS (A) 08/29/2021 0827   APPEARANCEUR CLEAR 08/29/2021 0827   LABSPEC 1.005 08/29/2021 0827   PHURINE 8.0 08/29/2021 0827   GLUCOSEU >=500 (A) 08/29/2021 0827   HGBUR NEGATIVE 08/29/2021 0827   BILIRUBINUR NEGATIVE 08/29/2021 0827   KETONESUR NEGATIVE 08/29/2021 0827   PROTEINUR 30 (A) 08/29/2021 0827   UROBILINOGEN 0.2 01/11/2014 1635   NITRITE NEGATIVE 08/29/2021 0827   LEUKOCYTESUR NEGATIVE 08/29/2021 0827   Sepsis Labs: @LABRCNTIP (procalcitonin:4,lacticidven:4) ) Recent Results (from the past 240 hour(s))  Blood culture (routine x 2)     Status: None (Preliminary result)   Collection Time:  04/22/22 10:09 AM   Specimen: BLOOD  Result Value Ref Range Status   Specimen Description   Final    BLOOD RIGHT ANTECUBITAL Performed at [facility], [place] [address] [place], [place] [address]    Special Requests   Final    BOTTLES DRAWN AEROBIC AND ANAEROBIC Blood Culture adequate volume Performed at [facility], [place] [address] [place], [place] [address]    Culture   Final    NO GROWTH 3 DAYS Performed at [facility], [address] [place], [place] [address]    Report Status PENDING  Incomplete  Blood culture (routine x 2)     Status: None (Preliminary result)   Collection Time: 04/22/22 10:09 AM   Specimen: BLOOD  Result Value Ref Range Status   Specimen Description   Final    BLOOD BLOOD RIGHT HAND Performed at [facility], [place] [address] [place], [place] [address]    Special Requests   Final    BOTTLES DRAWN AEROBIC AND ANAEROBIC Blood Culture adequate volume Performed at [facility], [place] [address] [place], [place] [address]    Culture   Final    NO GROWTH 3 DAYS Performed at [facility], [address], [place], [place] [address]    Report Status PENDING  Incomplete  Resp Panel by RT-PCR (Flu A&B, Covid) Anterior Nasal Swab      Status: None   Collection Time: 04/22/22 11:11 AM   Specimen: Anterior Nasal Swab  Result Value Ref Range Status   SARS Coronavirus 2 by RT PCR NEGATIVE NEGATIVE Final    Comment: (NOTE) SARS-CoV-2 target nucleic acids are NOT DETECTED.  The SARS-CoV-2 RNA is generally detectable in upper respiratory specimens during the acute phase of infection. The lowest concentration of SARS-CoV-2 viral copies this assay can detect is 138 copies/mL. A negative result does not preclude SARS-Cov-2 infection and should not be used as the sole basis for treatment or other patient management decisions. A negative result may occur with  improper specimen collection/handling, submission of specimen other than nasopharyngeal swab, presence of viral mutation(s) within the areas targeted by this assay, and inadequate number of viral copies(<138 copies/mL). A negative result must be combined with clinical observations, patient history, and epidemiological information. The expected result is Negative.  Fact Sheet for Patients:  EntrepreneurPulse.com.au  Fact Sheet for Healthcare Providers:  IncredibleEmployment.be  This test is no t yet approved or cleared by the [place] FDA and  has been authorized for detection and/or diagnosis of SARS-CoV-2 by FDA under an Emergency Use Authorization (EUA). This EUA will remain  in effect (meaning this test can be used) for the duration of the COVID-19 declaration under Section 564(b)(1) of the Act, 21 U.S.C.section 360bbb-3(b)(1), unless the authorization is terminated  or revoked sooner.       Influenza A by PCR NEGATIVE NEGATIVE Final   Influenza B by PCR NEGATIVE NEGATIVE Final    Comment: (NOTE) The Xpert Xpress SARS-CoV-2/FLU/RSV plus assay is intended as an aid in the diagnosis of influenza from Nasopharyngeal swab specimens and should not be used as a sole basis for treatment. Nasal washings and aspirates are  unacceptable for Xpert Xpress SARS-CoV-2/FLU/RSV testing.  Fact Sheet for Patients: EntrepreneurPulse.com.au  Fact Sheet for Healthcare Providers: IncredibleEmployment.be  This test is not yet approved or cleared by the [place] FDA and has been authorized for detection and/or diagnosis of SARS-CoV-2 by FDA under an  Emergency Use Authorization (EUA). This EUA will remain in effect (meaning this test can be used) for the duration of the COVID-19 declaration under Section 564(b)(1) of the Act, 21 U.S.C. section 360bbb-3(b)(1), unless the authorization is terminated or revoked.  Performed at [facility], [address], [place], [place] [address]   MRSA Next Gen by PCR, Nasal     Status: None   Collection Time: 04/22/22 10:45 PM   Specimen: Nasal Mucosa; Nasal Swab  Result Value Ref Range Status   MRSA by PCR Next Gen NOT DETECTED NOT DETECTED Final    Comment: (NOTE) The GeneXpert MRSA Assay (FDA approved for NASAL specimens only), is one component of a comprehensive MRSA colonization surveillance program. It is not intended to diagnose MRSA infection nor to guide or monitor treatment for MRSA infections. Test performance is not FDA approved in patients less than 85 years old. Performed at [facility], [address] [place], [place] [address]      Scheduled Meds:  carvedilol  6.25 mg Oral BID   Chlorhexidine Gluconate Cloth  6 each Topical Q0600   Chlorhexidine Gluconate Cloth  6 each Topical Q0600   doxycycline  100 mg Oral Q12H   heparin  2,000 Units Dialysis Once in dialysis   heparin  5,000 Units Subcutaneous Q8H   lidocaine  1 patch Transdermal Q24H   Continuous Infusions:  Procedures/Studies: DG CHEST PORT 1 VIEW  Result Date: 04/24/2022 CLINICAL DATA:  [id number], [id number]. Hypoxic respiratory failure CHF. Dialysis patient. EXAM: PORTABLE CHEST 1 VIEW COMPARISON:  Portable chest 04/22/2022 FINDINGS: 4:59 a.m. right IJ  double-lumen dialysis catheter again terminates in the right atrium. The heart is enlarged. There is increased perihilar vascular congestion and perihilar edema is also increased extending outward. There are worsening opacities of the mid to lower lung fields with a basal gradient and increased moderate pleural effusions. The lung opacities could be all due to alveolar edema or could be a combination of edema and pneumonia. The apical lungs remain clear. There is a stable mediastinum with aortic calcification and tortuosity. Osteopenia and thoracic levoscoliosis. IMPRESSION: Worsening perihilar vascular congestion and perihilar edema radiating outward. Increased opacities of mid to lower lung fields could be due to alveolar edema, pneumonia or combination. Increased moderate pleural effusions. Aortic atherosclerosis. Electronically Signed   By: Telford Nab M.D.   On: 04/24/2022 05:24   DG Chest Portable 1 View  Result Date: 04/22/2022 CLINICAL DATA:  Short of breath for 1 week. EXAM: PORTABLE CHEST 1 VIEW COMPARISON:  03/20/2022 and older exams. FINDINGS: Bilateral irregular interstitial thickening. Lung base opacities consistent with a combination of pleural effusions and atelectasis. Cardiac silhouette partly obscured, stable from prior exams. No mediastinal or hilar masses. Right anterior chest wall tunneled central venous catheter is stable. No pneumothorax. IMPRESSION: 1. Findings consistent with congestive heart failure with interstitial pulmonary edema and new bilateral pleural effusions with associated lung base atelectasis. Electronically Signed   By: Lajean Manes M.D.   On: 04/22/2022 09:01    Orson Eva, DO  [facility]  If 7PM-7AM, please contact night-coverage [email or web address] Password [email or web address] 04/25/2022, 5:39 PM   LOS: 2 days

## 2022-04-25 NOTE — Assessment & Plan Note (Signed)
Due to pulmonary edema Presented with tachypnea and saturation 90% on RA Wean off oxygen for saturation > 92% -improved, now stable on RA -pt ambulated on RA on 04/26/22 without desaturation

## 2022-04-26 LAB — RENAL FUNCTION PANEL
Albumin: 2.7 g/dL — ABNORMAL LOW (ref 3.5–5.0)
Anion gap: 13 (ref 5–15)
BUN: 53 mg/dL — ABNORMAL HIGH (ref 8–23)
CO2: 26 mmol/L (ref 22–32)
Calcium: 8.5 mg/dL — ABNORMAL LOW (ref 8.9–10.3)
Chloride: 94 mmol/L — ABNORMAL LOW (ref 98–111)
Creatinine, Ser: 5.39 mg/dL — ABNORMAL HIGH (ref 0.44–1.00)
GFR, Estimated: 7 mL/min — ABNORMAL LOW (ref >60)
Glucose, Bld: 89 mg/dL (ref 70–99)
Phosphorus: 5.3 mg/dL — ABNORMAL HIGH (ref 2.5–4.6)
Potassium: 4.3 mmol/L (ref 3.5–5.1)
Sodium: 133 mmol/L — ABNORMAL LOW (ref 135–145)

## 2022-04-26 LAB — PROCALCITONIN: Procalcitonin: 1.47 ng/mL

## 2022-04-26 MED ORDER — DOXYCYCLINE HYCLATE 100 MG PO TABS: 100.0000 mg | ORAL_TABLET | Freq: Two times a day (BID) | ORAL | 0 refills | Status: DC

## 2022-04-26 NOTE — Progress Notes (Signed)
Patient slept through the night, only waking for Medications to be given.

## 2022-04-26 NOTE — Evaluation (Signed)
Physical Therapy Evaluation Patient Details Name: Felicia Williams MRN: [id number] DOB: December 25, 1940 Today's Date: 04/26/2022  History of Present Illness  Felicia Williams is a 81 year old female with longstanding history of glomerulonephritis now with end-stage renal disease had been on PD for many years and transition to hemodialysis February 2023.  She is on MWF treatments and last treatment was 2 days ago.  She said that there were some trouble with that last treatment but she does not understand what happened.  She has hypertension, gout, GERD, anxiety depression, hyperlipidemia and anemia.     She reports that for the past week she is having increasing shortness of breath at rest and with lying flat.  She has been having to sleep in a recliner due to shortness of breath.  This continues to worsen.  She denies chest pain.  She denies fever and chills.  She is anuric.  She presented to the emergency department today because she has been having worsening shortness of breath and abdominal pain.  She denies nausea vomiting.  She denies diarrhea.   Clinical Impression  Patient functioning near baseline for functional mobility and gait. Patient demonstrates good return for ambulation in hallway on level, declined and inclined surfaces without AD, requiring one short standing rest break due to fatigue. Plan: patient discharged from physical therapy to care of nursing for ambulation daily as tolerated for length of stay.      Recommendations for follow up therapy are one component of a multi-disciplinary discharge planning process, led by the attending physician.  Recommendations may be updated based on patient status, additional functional criteria and insurance authorization.  Follow Up Recommendations No PT follow up      Assistance Recommended at Discharge PRN  Patient can return home with the following  Help with stairs or ramp for entrance;Assistance with cooking/housework    Equipment  Recommendations None recommended by PT  Recommendations for Other Services       Functional Status Assessment Patient has not had a recent decline in their functional status     Precautions / Restrictions Precautions Precautions: Fall Restrictions Weight Bearing Restrictions: No      Mobility  Bed Mobility Overal bed mobility: Independent             General bed mobility comments: demonstrates ease with supine to sit, no use of handrails    Transfers Overall transfer level: Independent Equipment used: None               General transfer comment: use of armrests on chair, no AD required    Ambulation/Gait Ambulation/Gait assistance: Modified independent (Device/Increase time), Supervision Gait Distance (Feet): 115 Feet Assistive device: None Gait Pattern/deviations: Decreased step length - left, Decreased step length - right, Decreased stride length Gait velocity: decreased     General Gait Details: Patient slightly unsteady on feet, ambulates in hallway on level, inclined and declined surfaces without AD but required one short rest break due to fatigue  Stairs            Wheelchair Mobility    Modified Rankin (Stroke Patients Only)       Balance Overall balance assessment: Mild deficits observed, not formally tested                                           Pertinent Vitals/Pain Pain Assessment Pain Assessment: 0-10 Pain Score: 5  Pain Location: back Pain Descriptors / Indicators: Aching    Home Living Family/patient expects to be discharged to:: Private residence Living Arrangements: Other relatives (lives with granddaughter) Available Help at Discharge: Family;Available 24 hours/day Type of Home: House Home Access: Stairs to enter Entrance Stairs-Rails: None Entrance Stairs-Number of Steps: 1   Home Layout: Able to live on main level with bedroom/bathroom;Laundry or work area in [place]: Kasandra Knudsen -  single Barista (2 wheels);Wheelchair - manual      Prior Function Prior Level of Function : Independent/Modified Independent;Driving             Mobility Comments: Short distance community ambulator w/o AD, drives ADLs Comments: Independent, PRN assistance from family     Hand Dominance   Dominant Hand: Right    Extremity/Trunk Assessment   Upper Extremity Assessment Upper Extremity Assessment: Overall WFL for tasks assessed    Lower Extremity Assessment Lower Extremity Assessment: Overall WFL for tasks assessed    Cervical / Trunk Assessment Cervical / Trunk Assessment: Normal  Communication   Communication: No difficulties  Cognition Arousal/Alertness: Awake/alert Behavior During Therapy: WFL for tasks assessed/performed Overall Cognitive Status: Within Functional Limits for tasks assessed                                          General Comments      Exercises     Assessment/Plan    PT Assessment Patient does not need any further PT services  PT Problem List         PT Treatment Interventions      PT Goals (Current goals can be found in the Care Plan section)  Acute Rehab PT Goals Patient Stated Goal: return home with family PT Goal Formulation: With patient Time For Goal Achievement: 05/03/22 Potential to Achieve Goals: Good    Frequency       Co-evaluation               AM-PAC PT "6 Clicks" Mobility  Outcome Measure Help needed turning from your back to your side while in a flat bed without using bedrails?: None Help needed moving from lying on your back to sitting on the side of a flat bed without using bedrails?: None Help needed moving to and from a bed to a chair (including a wheelchair)?: A Little Help needed standing up from a chair using your arms (e.g., wheelchair or bedside chair)?: A Little Help needed to walk in hospital room?: None Help needed climbing 3-5 steps with a railing? : A Little 6  Click Score: 21    End of Session   Activity Tolerance: Patient tolerated treatment well;Patient limited by fatigue Patient left: in chair;with call bell/phone within reach Nurse Communication: Mobility status PT Visit Diagnosis: Unsteadiness on feet (R26.81);Other abnormalities of gait and mobility (R26.89);Muscle weakness (generalized) (M62.81)    Time: 4825-0037 PT Time Calculation (min) (ACUTE ONLY): 15 min   Charges:   PT Evaluation $PT Eval Low Complexity: 1 Low PT Treatments $Therapeutic Activity: 8-22 mins        Zigmund Gottron, SPT

## 2022-04-26 NOTE — Progress Notes (Signed)
Ng Discharge Note  Admit Date:  04/22/2022 Discharge date: 04/26/2022   Nayali Talerico to be D/C'd Home per MD order.  AVS completed. Patient/caregiver able to verbalize understanding.  Discharge Medication: Allergies as of 04/26/2022       Reactions   Cefepime Other (See Comments)   Feb 2023 Encephalopathy with questionable seizures. Seems to be tolerating cefazolin    Morphine Other (See Comments)   "Dry heaving like crazy"   Sulfa Antibiotics Other (See Comments)   Shut pt's kidneys down        Medication List     STOP taking these medications    diazepam 5 MG tablet Commonly known as: VALIUM   ketoconazole 2 % cream Commonly known as: NIZORAL   traZODone 100 MG tablet Commonly known as: DESYREL       TAKE these medications    acetaminophen 325 MG tablet Commonly known as: TYLENOL Take 2 tablets (650 mg total) by mouth every 6 (six) hours as needed for mild pain.   camphor-menthol lotion Commonly known as: SARNA Apply topically as needed for itching. What changed: how much to take   carvedilol 12.5 MG tablet Commonly known as: COREG Take 1 tablet by mouth 2 (two) times daily.   doxycycline 100 MG tablet Commonly known as: VIBRA-TABS Take 1 tablet (100 mg total) by mouth every 12 (twelve) hours.   famotidine 40 MG tablet Commonly known as: Pepcid Take 1 tablet (40 mg total) by mouth at bedtime.   hydrALAZINE 50 MG tablet Commonly known as: APRESOLINE Take 50 mg by mouth 2 (two) times daily.   HYDROcodone-acetaminophen 10-325 MG tablet Commonly known as: NORCO Take 1 tablet by mouth 2 (two) times daily as needed for moderate pain.   Lactobacillus Probiotic Tabs Take 1 capsule by mouth daily.   loperamide 2 MG capsule Commonly known as: IMODIUM Take 1 capsule (2 mg total) by mouth as needed for diarrhea or loose stools. What changed: when to take this   multivitamin Tabs tablet Take 1 tablet by mouth at bedtime.        Discharge  Assessment: Vitals:   04/26/22 0455 04/26/22 1306  BP: (!) 165/54 (!) 152/46  Pulse: 70   Resp: 16 20  Temp: (!) 97 F (36.1 C) 97.9 F (36.6 C)  SpO2: 93% 97%   Skin clean, dry and intact without evidence of skin break down, no evidence of skin tears noted. IV catheter discontinued intact. Site without signs and symptoms of complications - no redness or edema noted at insertion site, patient denies c/o pain - only slight tenderness at site.  Dressing with slight pressure applied.  D/c Instructions-Education: Discharge instructions given to patient/family with verbalized understanding. D/c education completed with patient/family including follow up instructions, medication list, d/c activities limitations if indicated, with other d/c instructions as indicated by MD - patient able to verbalize understanding, all questions fully answered. Patient instructed to return to ED, call 911, or call MD for any changes in condition.  Patient escorted via [place], and D/C home via private auto.  Tsosie Billing, LPN 23/11/3612 4:31 PM

## 2022-04-26 NOTE — Progress Notes (Signed)
SATURATION QUALIFICATIONS: (This note is used to comply with regulatory documentation for home oxygen)  Patient Saturations on Room Air at Rest = 96%  Patient Saturations on Room Air while Ambulating = 95%  Patient Saturations on 0 Liters of oxygen while Ambulating = 0%

## 2022-04-26 NOTE — Progress Notes (Signed)
Patient ID: Felicia Williams, female   DOB: October 28, 1940, 81 y.o.   MRN: [id number] S: Feels much better today.  No oxygen requirements.  Post HD weight was 45 kg (EDW was 46kg) O:BP (!) 165/54 (BP Location: Right Arm)   Pulse 70   Temp (!) 97 F (36.1 C)   Resp 16   Ht 4\' 11"  (1.499 m)   Wt 47 kg   SpO2 93%   BMI 20.93 kg/m   Intake/Output Summary (Last 24 hours) at 04/26/2022 1044 Last data filed at 04/26/2022 0900 Gross per 24 hour  Intake 740 ml  Output 2500 ml  Net -1760 ml   Intake/Output: I/O last 3 completed shifts: In: 1020 [P.O.:1020] Out: 2500 [Other:2500]  Intake/Output this shift:  Total I/O In: 200 [P.O.:200] Out: -  Weight change:  Gen: NAD CVS: RRR Resp:CTA Abd:+BS, soft, NT/ND Ext: no edema, LUE AVG +T/B  Recent Labs  Lab 04/22/22 0904 04/22/22 0910 04/23/22 0404 04/24/22 0351 04/25/22 0324 04/26/22 0615  NA 135 135 132* 135 136 133*  K 5.5* 5.4* 5.7* 4.9 5.0 4.3  CL 100 101 99 96* 95* 94*  CO2 24  --  22 25 25 26   GLUCOSE 111* 110* 92 71 96 89  BUN 38* 37* 54* 34* 61* 53*  CREATININE 5.97* 6.60* 6.64* 4.72* 6.66* 5.39*  ALBUMIN 3.3*  --  2.9* 2.8* 2.7* 2.7*  CALCIUM 8.8*  --  8.7* 8.5* 8.7* 8.5*  PHOS  --   --  6.4* 5.3* 6.6* 5.3*  AST 22  --   --   --   --   --   ALT 13  --   --   --   --   --    Liver Function Tests: Recent Labs  Lab 04/22/22 0904 04/23/22 0404 04/24/22 0351 04/25/22 0324 04/26/22 0615  AST 22  --   --   --   --   ALT 13  --   --   --   --   ALKPHOS 70  --   --   --   --   BILITOT 1.0  --   --   --   --   PROT 6.6  --   --   --   --   ALBUMIN 3.3*   < > 2.8* 2.7* 2.7*   < > = values in this interval not displayed.   Recent Labs  Lab 04/22/22 0904  LIPASE 37   No results for input(s): "AMMONIA" in the last 168 hours. CBC: Recent Labs  Lab 04/22/22 0904 04/22/22 0910 04/23/22 0404 04/24/22 0351 04/25/22 0324  WBC 13.1*  --  8.6 10.9* 8.5  NEUTROABS 11.7*  --   --   --   --   HGB 12.2   < > 10.5* 10.9*  10.4*  HCT 37.7   < > 33.7* 35.4* 32.3*  MCV 87.1  --  88.0 90.1 86.4  PLT 189  --  180 195 226   < > = values in this interval not displayed.   Cardiac Enzymes: No results for input(s): "CKTOTAL", "CKMB", "CKMBINDEX", "TROPONINI" in the last 168 hours. CBG: Recent Labs  Lab 04/22/22 2251 04/23/22 1555  GLUCAP 100* 70    Iron Studies: No results for input(s): "IRON", "TIBC", "TRANSFERRIN", "FERRITIN" in the last 72 hours. Studies/Results: No results found.  carvedilol  6.25 mg Oral BID   Chlorhexidine Gluconate Cloth  6 each Topical Q0600   Chlorhexidine Gluconate Cloth  6  each Topical Q0600   doxycycline  100 mg Oral Q12H   heparin  2,000 Units Dialysis Once in dialysis   heparin  5,000 Units Subcutaneous Q8H   lidocaine  1 patch Transdermal Q24H    BMET    Component Value Date/Time   NA 133 (L) 04/26/2022 0615   K 4.3 04/26/2022 0615   CL 94 (L) 04/26/2022 0615   CO2 26 04/26/2022 0615   GLUCOSE 89 04/26/2022 0615   BUN 53 (H) 04/26/2022 0615   CREATININE 5.39 (H) 04/26/2022 0615   CALCIUM 8.5 (L) 04/26/2022 0615   CALCIUM 7.1 (L) 01/10/2019 0632   GFRNONAA 7 (L) 04/26/2022 0615   GFRAA 11 (L) 01/20/2019 0608   CBC    Component Value Date/Time   WBC 8.5 04/25/2022 0324   RBC 3.74 (L) 04/25/2022 0324   HGB 10.4 (L) 04/25/2022 0324   HCT 32.3 (L) 04/25/2022 0324   PLT 226 04/25/2022 0324   MCV 86.4 04/25/2022 0324   MCH 27.8 04/25/2022 0324   MCHC 32.2 04/25/2022 0324   RDW 18.7 (H) 04/25/2022 0324   LYMPHSABS 0.5 (L) 04/22/2022 0904   MONOABS 0.8 04/22/2022 0904   EOSABS 0.1 04/22/2022 0904   BASOSABS 0.0 04/22/2022 0904   [name] MWF 3.5 hours-  EDW 46 kg, 3K/2.5 calc, TDC 400 BFR . 500 DFR. Heparin 1200 per hour, mircera 75 q 2 weeks and venofer currently 100 per tx  ( has aVG placed 8/29)  Assessment/ Plan: Pt is a 81 y.o. yo female  with ESRD who was admitted on 04/22/2022 with  progressive dyspnea  -  x-ray c/w volume  overload Assessment/Plan: 1. SOB- chest x-ray c/w pulmonary edema and pleural effusions -  Pt reports she has been losing weight and is down 30 lbs since February.  She had serial HD and UF with new EDW of 45 kg.  Likely was not challenging as an outpatient due to weight loss.  Discussed fluid restriction. 2. ESRD -  normally MWF as OP via TDC but AVG is very close to being able to use.  New EDW is 45 kg and will have HD tomorrow at her outpatient unit. 3. Anemia-  not a major issue at this point 4. Secondary hyperparathyroidism-  is not on vit D or binder as OP ?  5. HTN/volume-  WIll try to hold hydralazine and lower coreg to be able to UF more successfully tomorrow  6. Disposition - for discharge to home today and follow up at outpatient HD tomorrow.   Felicia Potts, MD [facility]

## 2022-04-26 NOTE — Care Management Important Message (Signed)
Important Message  Patient Details  Name: Felicia Williams MRN: [id number] Date of Birth: Dec 11, 1940   Medicare Important Message Given:  Yes     Tommy Medal 04/26/2022, 11:27 AM

## 2022-04-26 NOTE — Discharge Summary (Signed)
Physician Discharge Summary   Patient: Felicia Williams MRN: [id number] DOB: [birth year]  Admit date:     04/22/2022  Discharge date: 04/26/22  Discharge Physician: Shanon Brow Sheikh Leverich   PCP: [name], NP   Recommendations at discharge:   Please follow up with primary care provider within 1-2 weeks  Please repeat BMP and CBC in one week     Hospital Course: 81 year old female with longstanding history of glomerulonephritis now with end-stage renal disease had been on PD for many years and transition to hemodialysis February 2023.  She is on MWF treatments and last treatment was 2 days ago.  She said that there were some trouble with that last treatment but she does not understand what happened.  She has hypertension, gout, GERD, anxiety depression, hyperlipidemia and anemia.  She reports that for the past week she is having increasing shortness of breath at rest and with lying flat.  She has been having to sleep in a recliner due to shortness of breath.  This continues to worsen.  She denies chest pain.  She denies fever and chills.  She is anuric.  She presented to the emergency department today because she has been having worsening shortness of breath and abdominal pain.  She denies nausea vomiting.  She denies diarrhea.  She was noted to have a markedly elevated cardiac BNP of 4375, elevated troponin at 421, hyperkalemic with a K of 5.5.  Her chest x-ray shows findings of interstitial pulmonary edema and congestive heart failure.  Admission requested for further management.    Assessment and Plan: * Volume overload -due to indiscretion with fluid intake at home -10/2--UF 2.6L -10/4-UF 2.5L   -1200 cc fluid restrict every 24 hours -04/26/22--improved, clinically euvolemic  Acute respiratory failure with hypoxia (HCC) Due to pulmonary edema Presented with tachypnea and saturation 90% on RA Wean off oxygen for saturation > 92% -improved, now stable on RA -pt ambulated on RA on  04/26/22 without desaturation  ESRD on dialysis ([place]) - extra HD treatments per nephrologist for volume removal -appreciate nephrology --10/2--UF 2.6L -10/4-UF 2.5L   -1200 cc fluid restrict every 24 hours  Elevated troponin - Demand ischemia due to fluid overload and ESRD, do not suspect ACS -no chest pain  Essential hypertension, benign - Resumed home coreg at lower dose initially>>titrate back to home dose - holding hydralazine>>restart   Pressure injury of skin -- continue skin care protocol   Hyperkalemia - Treated with hemodialysis and resolved now  Generalized weakness - Multifactorial given heart failure presentation and end-stage renal disease.  Will request PT eval >>no follow up needed  Gout - Reportedly stable no current symptoms  Chronic cough - Stable, follow-up respiratory panel --> negative  -- CXR concerning; excess fluid may be hiding a pneumonia?? -- started doxycycline 100 mg BID on 10/3 >>d/c home with 3 more days - check PCT--1.46  Gastroparesis - Currently stable no symptoms, follow-up with GI outpatient Tolerating diet         Consultants: renal Procedures performed: HD  Disposition: Home Diet recommendation:  Renal diet DISCHARGE MEDICATION: Allergies as of 04/26/2022       Reactions   Cefepime Other (See Comments)   Feb 2023 Encephalopathy with questionable seizures. Seems to be tolerating cefazolin    Morphine Other (See Comments)   "Dry heaving like crazy"   Sulfa Antibiotics Other (See Comments)   Shut pt's kidneys down        Medication List     STOP taking  these medications    diazepam 5 MG tablet Commonly known as: VALIUM   ketoconazole 2 % cream Commonly known as: NIZORAL   traZODone 100 MG tablet Commonly known as: DESYREL       TAKE these medications    acetaminophen 325 MG tablet Commonly known as: TYLENOL Take 2 tablets (650 mg total) by mouth every 6 (six) hours as needed for mild pain.    camphor-menthol lotion Commonly known as: SARNA Apply topically as needed for itching. What changed: how much to take   carvedilol 12.5 MG tablet Commonly known as: COREG Take 1 tablet by mouth 2 (two) times daily.   doxycycline 100 MG tablet Commonly known as: VIBRA-TABS Take 1 tablet (100 mg total) by mouth every 12 (twelve) hours.   famotidine 40 MG tablet Commonly known as: Pepcid Take 1 tablet (40 mg total) by mouth at bedtime.   hydrALAZINE 50 MG tablet Commonly known as: APRESOLINE Take 50 mg by mouth 2 (two) times daily.   HYDROcodone-acetaminophen 10-325 MG tablet Commonly known as: NORCO Take 1 tablet by mouth 2 (two) times daily as needed for moderate pain.   Lactobacillus Probiotic Tabs Take 1 capsule by mouth daily.   loperamide 2 MG capsule Commonly known as: IMODIUM Take 1 capsule (2 mg total) by mouth as needed for diarrhea or loose stools. What changed: when to take this   multivitamin Tabs tablet Take 1 tablet by mouth at bedtime.        Discharge Exam: Filed Weights   04/22/22 2344 04/23/22 0417 04/25/22 1006  Weight: 47.8 kg 47.2 kg 47 kg   HEENT:  [place]/AT, No thrush, no icterus CV:  RRR, no rub, no S3, no S4 Lung:  fine bibasilar crackles. No wheeze Abd:  soft/+BS, NT Ext:  No edema, no lymphangitis, no synovitis, no rash   Condition at discharge: stable  The results of significant diagnostics from this hospitalization (including imaging, microbiology, ancillary and laboratory) are listed below for reference.   Imaging Studies: DG CHEST PORT 1 VIEW  Result Date: 04/24/2022 CLINICAL DATA:  [id number], [id number]. Hypoxic respiratory failure CHF. Dialysis patient. EXAM: PORTABLE CHEST 1 VIEW COMPARISON:  Portable chest 04/22/2022 FINDINGS: 4:59 a.m. right IJ double-lumen dialysis catheter again terminates in the right atrium. The heart is enlarged. There is increased perihilar vascular congestion and perihilar edema is also increased extending  outward. There are worsening opacities of the mid to lower lung fields with a basal gradient and increased moderate pleural effusions. The lung opacities could be all due to alveolar edema or could be a combination of edema and pneumonia. The apical lungs remain clear. There is a stable mediastinum with aortic calcification and tortuosity. Osteopenia and thoracic levoscoliosis. IMPRESSION: Worsening perihilar vascular congestion and perihilar edema radiating outward. Increased opacities of mid to lower lung fields could be due to alveolar edema, pneumonia or combination. Increased moderate pleural effusions. Aortic atherosclerosis. Electronically Signed   By: Telford Nab M.D.   On: 04/24/2022 05:24   DG Chest Portable 1 View  Result Date: 04/22/2022 CLINICAL DATA:  Short of breath for 1 week. EXAM: PORTABLE CHEST 1 VIEW COMPARISON:  03/20/2022 and older exams. FINDINGS: Bilateral irregular interstitial thickening. Lung base opacities consistent with a combination of pleural effusions and atelectasis. Cardiac silhouette partly obscured, stable from prior exams. No mediastinal or hilar masses. Right anterior chest wall tunneled central venous catheter is stable. No pneumothorax. IMPRESSION: 1. Findings consistent with congestive heart failure with interstitial pulmonary edema and new  bilateral pleural effusions with associated lung base atelectasis. Electronically Signed   By: Lajean Manes M.D.   On: 04/22/2022 09:01    Microbiology: Results for orders placed or performed during the hospital encounter of 04/22/22  Blood culture (routine x 2)     Status: None (Preliminary result)   Collection Time: 04/22/22 10:09 AM   Specimen: BLOOD  Result Value Ref Range Status   Specimen Description   Final    BLOOD RIGHT ANTECUBITAL Performed at [facility], [place] [address], [place], [place] [address]    Special Requests   Final    BOTTLES DRAWN AEROBIC AND ANAEROBIC Blood Culture  adequate volume Performed at [facility], [place] [address], [place], [place] [address]    Culture   Final    NO GROWTH 4 DAYS Performed at [facility], [address] [place], [place] [address]    Report Status PENDING  Incomplete  Blood culture (routine x 2)     Status: None (Preliminary result)   Collection Time: 04/22/22 10:09 AM   Specimen: BLOOD  Result Value Ref Range Status   Specimen Description   Final    BLOOD BLOOD RIGHT HAND Performed at [facility], [place] [address] [place], [place] [address]    Special Requests   Final    BOTTLES DRAWN AEROBIC AND ANAEROBIC Blood Culture adequate volume Performed at [facility], [place] [address], [place], [place] [address]    Culture   Final    NO GROWTH 4 DAYS Performed at [facility], [address], [place], [place] [address]    Report Status PENDING  Incomplete  Resp Panel by RT-PCR (Flu A&B, Covid) Anterior Nasal Swab     Status: None   Collection Time: 04/22/22 11:11 AM   Specimen: Anterior Nasal Swab  Result Value Ref Range Status   SARS Coronavirus 2 by RT PCR NEGATIVE NEGATIVE Final    Comment: (NOTE) SARS-CoV-2 target nucleic acids are NOT DETECTED.  The SARS-CoV-2 RNA is generally detectable in upper respiratory specimens during the acute phase of infection. The lowest concentration of SARS-CoV-2 viral copies this assay can detect is 138 copies/mL. A negative result does not preclude SARS-Cov-2 infection and should not be used as the sole basis for treatment or other patient management decisions. A negative result may occur with  improper specimen collection/handling, submission of specimen other than nasopharyngeal swab, presence of viral mutation(s) within the areas targeted by this assay, and inadequate number of viral copies(<138 copies/mL). A negative result must be combined with clinical observations, patient history, and  epidemiological information. The expected result is Negative.  Fact Sheet for Patients:  EntrepreneurPulse.com.au  Fact Sheet for Healthcare Providers:  IncredibleEmployment.be  This test is no t yet approved or cleared by the [place] FDA and  has been authorized for detection and/or diagnosis of SARS-CoV-2 by FDA under an Emergency Use Authorization (EUA). This EUA will remain  in effect (meaning this test can be used) for the duration of the COVID-19 declaration under Section 564(b)(1) of the Act, 21 U.S.C.section 360bbb-3(b)(1), unless the authorization is terminated  or revoked sooner.       Influenza A by PCR NEGATIVE NEGATIVE Final   Influenza B by PCR NEGATIVE NEGATIVE Final    Comment: (NOTE) The Xpert Xpress SARS-CoV-2/FLU/RSV plus assay is intended as an aid in the diagnosis of influenza from Nasopharyngeal swab specimens and should not be used as a  sole basis for treatment. Nasal washings and aspirates are unacceptable for Xpert Xpress SARS-CoV-2/FLU/RSV testing.  Fact Sheet for Patients: EntrepreneurPulse.com.au  Fact Sheet for Healthcare Providers: IncredibleEmployment.be  This test is not yet approved or cleared by the [place] FDA and has been authorized for detection and/or diagnosis of SARS-CoV-2 by FDA under an Emergency Use Authorization (EUA). This EUA will remain in effect (meaning this test can be used) for the duration of the COVID-19 declaration under Section 564(b)(1) of the Act, 21 U.S.C. section 360bbb-3(b)(1), unless the authorization is terminated or revoked.  Performed at [facility], [address], [place], [place] [address]   MRSA Next Gen by PCR, Nasal     Status: None   Collection Time: 04/22/22 10:45 PM   Specimen: Nasal Mucosa; Nasal Swab  Result Value Ref Range Status   MRSA by PCR Next Gen NOT DETECTED NOT DETECTED Final    Comment: (NOTE) The  GeneXpert MRSA Assay (FDA approved for NASAL specimens only), is one component of a comprehensive MRSA colonization surveillance program. It is not intended to diagnose MRSA infection nor to guide or monitor treatment for MRSA infections. Test performance is not FDA approved in patients less than 57 years old. Performed at [facility], [address] [place], [place] [address]     Labs: CBC: Recent Labs  Lab 04/22/22 [phone] 04/22/22 0910 04/23/22 0404 04/24/22 0351 04/25/22 0324  WBC 13.1*  --  8.6 10.9* 8.5  NEUTROABS 11.7*  --   --   --   --   HGB 12.2 13.3 10.5* 10.9* 10.4*  HCT 37.7 39.0 33.7* 35.4* 32.3*  MCV 87.1  --  88.0 90.1 86.4  PLT 189  --  180 195 803   Basic Metabolic Panel: Recent Labs  Lab 04/22/22 0904 04/22/22 0910 04/23/22 0404 04/24/22 0351 04/25/22 0324 04/26/22 0615  NA 135 135 132* 135 136 133*  K 5.5* 5.4* 5.7* 4.9 5.0 4.3  CL 100 101 99 96* 95* 94*  CO2 24  --  22 25 25 26   GLUCOSE 111* 110* 92 71 96 89  BUN 38* 37* 54* 34* 61* 53*  CREATININE 5.97* 6.60* 6.64* 4.72* 6.66* 5.39*  CALCIUM 8.8*  --  8.7* 8.5* 8.7* 8.5*  PHOS  --   --  6.4* 5.3* 6.6* 5.3*   Liver Function Tests: Recent Labs  Lab 04/22/22 0904 04/23/22 0404 04/24/22 0351 04/25/22 0324 04/26/22 0615  AST 22  --   --   --   --   ALT 13  --   --   --   --   ALKPHOS 70  --   --   --   --   BILITOT 1.0  --   --   --   --   PROT 6.6  --   --   --   --   ALBUMIN 3.3* 2.9* 2.8* 2.7* 2.7*   CBG: Recent Labs  Lab 04/22/22 2251 04/23/22 1555  GLUCAP 100* 70    Discharge time spent: greater than 30 minutes.  Signed: Orson Eva, MD [facility] 04/26/2022

## 2022-04-27 LAB — CULTURE, BLOOD (ROUTINE X 2)
Culture: NO GROWTH
Culture: NO GROWTH
Special Requests: ADEQUATE
Special Requests: ADEQUATE

## 2022-04-29 ENCOUNTER — Encounter (HOSPITAL_COMMUNITY): Payer: Self-pay

## 2022-04-29 ENCOUNTER — Emergency Department (HOSPITAL_COMMUNITY)
Admission: EM | Admit: 2022-04-29 | Discharge: 2022-04-29 | Disposition: A | Discharge status: Home / Self Care | Payer: Medicare Other | Attending: Emergency Medicine | Admitting: Emergency Medicine

## 2022-04-29 ENCOUNTER — Other Ambulatory Visit: Payer: Self-pay

## 2022-04-29 ENCOUNTER — Emergency Department (HOSPITAL_COMMUNITY): Payer: Medicare Other

## 2022-04-29 DIAGNOSIS — K862 Cyst of pancreas: Secondary | ICD-10-CM | POA: Diagnosis not present

## 2022-04-29 DIAGNOSIS — N186 End stage renal disease: Secondary | ICD-10-CM | POA: Insufficient documentation

## 2022-04-29 DIAGNOSIS — E875 Hyperkalemia: Secondary | ICD-10-CM | POA: Insufficient documentation

## 2022-04-29 DIAGNOSIS — I120 Hypertensive chronic kidney disease with stage 5 chronic kidney disease or end stage renal disease: Secondary | ICD-10-CM | POA: Diagnosis not present

## 2022-04-29 DIAGNOSIS — Z79899 Other long term (current) drug therapy: Secondary | ICD-10-CM | POA: Diagnosis not present

## 2022-04-29 DIAGNOSIS — Z992 Dependence on renal dialysis: Secondary | ICD-10-CM | POA: Diagnosis not present

## 2022-04-29 DIAGNOSIS — R1013 Epigastric pain: Secondary | ICD-10-CM | POA: Diagnosis present

## 2022-04-29 DIAGNOSIS — R112 Nausea with vomiting, unspecified: Secondary | ICD-10-CM

## 2022-04-29 LAB — CBC
HCT: 35.9 % — ABNORMAL LOW (ref 36.0–46.0)
Hemoglobin: 11.4 g/dL — ABNORMAL LOW (ref 12.0–15.0)
MCH: 27.7 pg (ref 26.0–34.0)
MCHC: 31.8 g/dL (ref 30.0–36.0)
MCV: 87.1 fL (ref 80.0–100.0)
Platelets: 276 10*3/uL (ref 150–400)
RBC: 4.12 MIL/uL (ref 3.87–5.11)
RDW: 18.7 % — ABNORMAL HIGH (ref 11.5–15.5)
WBC: 9.4 10*3/uL (ref 4.0–10.5)
nRBC: 0 % (ref 0.0–0.2)

## 2022-04-29 LAB — COMPREHENSIVE METABOLIC PANEL
ALT: 18 U/L (ref 0–44)
AST: 24 U/L (ref 15–41)
Albumin: 3.4 g/dL — ABNORMAL LOW (ref 3.5–5.0)
Alkaline Phosphatase: 81 U/L (ref 38–126)
Anion gap: 16 — ABNORMAL HIGH (ref 5–15)
BUN: 75 mg/dL — ABNORMAL HIGH (ref 8–23)
CO2: 23 mmol/L (ref 22–32)
Calcium: 9.2 mg/dL (ref 8.9–10.3)
Chloride: 99 mmol/L (ref 98–111)
Creatinine, Ser: 7.62 mg/dL — ABNORMAL HIGH (ref 0.44–1.00)
GFR, Estimated: 5 mL/min — ABNORMAL LOW (ref >60)
Glucose, Bld: 101 mg/dL — ABNORMAL HIGH (ref 70–99)
Potassium: 5.8 mmol/L — ABNORMAL HIGH (ref 3.5–5.1)
Sodium: 138 mmol/L (ref 135–145)
Total Bilirubin: 0.8 mg/dL (ref 0.3–1.2)
Total Protein: 7.2 g/dL (ref 6.5–8.1)

## 2022-04-29 LAB — TROPONIN I (HIGH SENSITIVITY): Troponin I (High Sensitivity): 27 ng/L — ABNORMAL HIGH (ref <18)

## 2022-04-29 LAB — LIPASE, BLOOD: Lipase: 45 U/L (ref 11–51)

## 2022-04-29 MED ORDER — LIDOCAINE 5 % EX PTCH
1.0000 | MEDICATED_PATCH | CUTANEOUS | Status: DC
  Filled 2022-04-29: qty 1

## 2022-04-29 MED ORDER — SODIUM ZIRCONIUM CYCLOSILICATE 5 G PO PACK
10.0000 g | PACK | ORAL | Status: AC
  Administered 2022-04-29: 10 g via ORAL
  Filled 2022-04-29: qty 2

## 2022-04-29 MED ORDER — IOHEXOL 300 MG/ML  SOLN
100.0000 mL | Freq: Once | INTRAMUSCULAR | Status: AC | PRN
  Administered 2022-04-29: 100 mL via INTRAVENOUS

## 2022-04-29 MED ORDER — ONDANSETRON 4 MG PO TBDP: 4.0000 mg | ORAL_TABLET | Freq: Three times a day (TID) | ORAL | 0 refills | Status: DC | PRN

## 2022-04-29 MED ORDER — ONDANSETRON HCL 4 MG/2ML IJ SOLN
4.0000 mg | Freq: Once | INTRAMUSCULAR | Status: AC
  Administered 2022-04-29: 4 mg via INTRAVENOUS
  Filled 2022-04-29: qty 2

## 2022-04-29 MED ORDER — PANTOPRAZOLE SODIUM 40 MG PO TBEC: 40.0000 mg | DELAYED_RELEASE_TABLET | Freq: Every day | ORAL | 1 refills | Status: DC

## 2022-04-29 MED ORDER — HYDROCODONE-ACETAMINOPHEN 5-325 MG PO TABS
1.0000 | ORAL_TABLET | Freq: Once | ORAL | Status: DC
  Filled 2022-04-29: qty 1

## 2022-04-29 MED ORDER — ONDANSETRON 4 MG PO TBDP
4.0000 mg | ORAL_TABLET | Freq: Once | ORAL | Status: DC
  Filled 2022-04-29: qty 1

## 2022-04-29 NOTE — ED Provider Triage Note (Signed)
Emergency Medicine Provider Triage Evaluation Note  Felicia Williams , a 81 y.o. female  was evaluated in triage.  Pt complains of epigastric pain, nausea and vomiting for the last day.  States that the pain has been intermittent, and ranges from mild to severe in nature.  Endorses diffuse nausea and vomiting with worsening abdominal pain after vomiting.  History of dialysis, Monday Wednesday Friday.  No fevers or chills.  Just got out of the hospital and is on an antibiotic.  Has daily diarrhea, which she endorses this is chronic  Review of Systems  Positive: N/V Negative: Fever, chills  Physical Exam  BP (!) 184/56 (BP Location: Right Arm)   Pulse 80   Temp 98.1 F (36.7 C) (Oral)   Resp 18   Ht 4\' 11"  (1.499 m)   Wt 46.7 kg   SpO2 100%   BMI 20.80 kg/m  Gen:   Awake, no distress  Resp:  Normal effort  MSK:   Moves extremities without difficulty  Other:  TTP of epigastric area  Medical Decision Making  Medically screening exam initiated at 3:31 PM.  Appropriate orders placed.  Elyanna Wallick was informed that the remainder of the evaluation will be completed by another provider, this initial triage assessment does not replace that evaluation, and the importance of remaining in the ED until their evaluation is complete.     Osvaldo Shipper, [place] 04/29/22 1538

## 2022-04-29 NOTE — ED Provider Notes (Signed)
[facility] EMERGENCY DEPARTMENT Provider Note   CSN: [id number] Arrival date & time: 04/29/22  1516     History {Add pertinent medical, surgical, social history, OB history to HPI:1} No chief complaint on file.   Bonney Berres is a 81 y.o. female.  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  She is currently getting dialysis through a catheter in the right upper chest but also has a fistula in the left arm which is present but has never been used.  She had recently been admitted to the hospital within the last week because of fluid overload and congestive heart failure, she spent several days in the hospital, she was discharged on 5 October and had done okay until last night when she developed nausea and vomiting with abdominal pain in the epigastrium.  No chest pain, no shortness of breath, no swelling of the legs.  She states that she has been vomiting throughout the night as well as today and has not been able to hold down her medications at home.  She denies any back pain.  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 Authorizing Provider  acetaminophen (TYLENOL) 325 MG tablet Take 2 tablets (650 mg total) by mouth every 6 (six) hours as needed for mild pain. 09/12/21   Love, Ivan Anchors, PA-C  camphor-menthol [facility]) lotion Apply topically as needed for itching. Patient taking differently: Apply 1 Application topically as needed for itching. 09/12/21   Love, Ivan Anchors, PA-C  carvedilol (COREG) 12.5 MG tablet Take 1 tablet by mouth 2 (two) times daily.    [provider]  doxycycline  (VIBRA-TABS) 100 MG tablet Take 1 tablet (100 mg total) by mouth every 12 (twelve) hours. 04/26/22   Orson Eva, MD  famotidine (PEPCID) 40 MG tablet Take 1 tablet (40 mg total) by mouth at bedtime. 11/28/21   Rogene Houston, MD  hydrALAZINE (APRESOLINE) 50 MG tablet Take 50 mg by mouth 2 (two) times daily.    [provider]  HYDROcodone-acetaminophen (NORCO) 10-325 MG tablet Take 1 tablet by mouth 2 (two) times daily as needed for moderate pain. 02/01/22   [provider]  Lactobacillus Probiotic TABS Take 1 capsule by mouth daily.    [provider]  loperamide (IMODIUM) 2 MG capsule Take 1 capsule (2 mg total) by mouth as needed for diarrhea or loose stools. Patient taking differently: Take 2 mg by mouth in the morning and at bedtime. 09/12/21   Love, Ivan Anchors, PA-C  multivitamin (RENA-VIT) TABS tablet Take 1 tablet by mouth at bedtime. 01/20/19   Nita Sells, MD      Allergies    Cefepime, Morphine, and Sulfa antibiotics    Review of Systems   Review of Systems  All other systems reviewed and are negative.   Physical Exam Updated Vital Signs BP (!) 184/56 (BP Location: Right Arm)   Pulse 80   Temp 98.1 F (36.7 C) (Oral)   Resp 18   Ht 1.499 m (4\' 11" )   Wt 46.7 kg   SpO2 100%   BMI 20.80 kg/m  Physical  Exam Vitals and nursing note reviewed.  Constitutional:      General: She is not in acute distress.    Appearance: She is well-developed.  HENT:     Head: Normocephalic and atraumatic.     Mouth/Throat:     Pharynx: No oropharyngeal exudate.  Eyes:     General: No scleral icterus.       Right eye: No discharge.        Left eye: No discharge.     Conjunctiva/sclera: Conjunctivae normal.     Pupils: Pupils are equal, round, and reactive to light.  Neck:     Thyroid: No thyromegaly.     Vascular: No JVD.  Cardiovascular:     Rate and Rhythm: Normal rate and regular rhythm.     Heart sounds: Normal heart sounds. No murmur heard.    No  friction rub. No gallop.  Pulmonary:     Effort: Pulmonary effort is normal. No respiratory distress.     Breath sounds: Normal breath sounds. No wheezing or rales.  Abdominal:     General: Bowel sounds are normal. There is no distension.     Palpations: Abdomen is soft. There is no mass.     Tenderness: There is abdominal tenderness.     Comments: Mild to moderate epigastric tenderness, very soft abdomen, no peritoneal signs or guarding, no Murphy sign  Musculoskeletal:        General: No tenderness. Normal range of motion.     Cervical back: Normal range of motion and neck supple.     Right lower leg: No edema.     Left lower leg: No edema.  Lymphadenopathy:     Cervical: No cervical adenopathy.  Skin:    General: Skin is warm and dry.     Findings: No erythema or rash.  Neurological:     Mental Status: She is alert.     Coordination: Coordination normal.  Psychiatric:        Behavior: Behavior normal.    ED Results / Procedures / Treatments   Labs (all labs ordered are listed, but only abnormal results are displayed) Labs Reviewed  CBC - Abnormal; Notable for the following components:      Result Value   Hemoglobin 11.4 (*)    HCT 35.9 (*)    RDW 18.7 (*)    All other components within normal limits  LIPASE, BLOOD  COMPREHENSIVE METABOLIC PANEL  URINALYSIS, ROUTINE W REFLEX MICROSCOPIC  TROPONIN I (HIGH SENSITIVITY)    EKG None  Radiology DG Chest 1 View  Result Date: 04/29/2022 CLINICAL DATA:  Difficulty breathing, vomiting EXAM: CHEST  1 VIEW COMPARISON:  Previous studies including the examination of 04/24/2022 FINDINGS: Transverse diameter of heart is increased. There is significant interval decrease in pulmonary vascular congestion and clearing of pulmonary edema. There are no new focal infiltrates. Small linear density in right lower lung fields suggest scarring or subsegmental atelectasis. Lateral CP angles are clear. There is no pneumothorax. Tip of right IJ  dialysis catheter is seen in right atrium. IMPRESSION: There is interval clearing of pulmonary vascular congestion and pulmonary edema. There are no new focal infiltrates. Small linear density in lateral aspect of right lower lung field may suggest scarring or subsegmental atelectasis. Electronically Signed   By: Elmer Picker M.D.   On: 04/29/2022 16:01    Procedures Procedures  {Document cardiac monitor, telemetry assessment procedure when appropriate:1}  Medications Ordered in ED Medications  lidocaine (LIDODERM) 5 % 1 patch (  has no administration in time range)  ondansetron (ZOFRAN-ODT) disintegrating tablet 4 mg (has no administration in time range)  HYDROcodone-acetaminophen (NORCO/VICODIN) 5-325 MG per tablet 1 tablet (has no administration in time range)  ondansetron (ZOFRAN) injection 4 mg (has no administration in time range)    ED Course/ Medical Decision Making/ A&P                           Medical Decision Making Amount and/or Complexity of Data Reviewed Labs: ordered. Radiology: ordered.  Risk Prescription drug management.   This patient presents to the ED for concern of abdominal pain with vomiting, this involves an extensive number of treatment options, and is a complaint that carries with it a high risk of complications and morbidity.  The differential diagnosis includes pancreatitis, peptic ulcer disease, cholecystitis, bowel obstruction, seems less likely be coronary disease and EKG is unremarkable   Co morbidities that complicate the patient evaluation  Hypertension, end-stage renal disease on dialysis, recent congestive heart failure   Additional history obtained:  Additional history obtained from electronic medical record External records from outside source obtained and reviewed including prior discharge summaries and hospital course   Lab Tests:  I Ordered, and personally interpreted labs.  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  I personally viewed and interpreted the cardiac monitored which showed an underlying rhythm of: Normal sinus rhythm, EKG is unremarkable and nonischemic   Problem List / ED Course / Critical interventions / Medication management  And evaluation of the cause of the patient's abdominal discomfort did not reveal any specific sources.  She was given a dose of medication in the form Zofran, she was given a Lidoderm patch, she was given Lokelma I ordered medication including ***  for ***  Reevaluation of the patient after these medicines showed that the patient {resolved/improved/worsened:23923::"improved"} I have reviewed the patients home medicines and have made adjustments as needed   Social Determinants of Health:  ***   Test / Admission - Considered:  ***   {Document critical care time when appropriate:1} {Document review of labs and clinical decision tools ie heart score, Chads2Vasc2 etc:1}  {Document your independent review of radiology images, and any outside records:1} {Document your discussion with family members, caretakers, and with consultants:1} {Document social determinants of health affecting pt's care:1} {Document your decision making why or why not admission, treatments were needed:1} Final Clinical Impression(s) / ED Diagnoses Final diagnoses:  None    Rx / DC Orders ED Discharge Orders     None

## 2022-04-29 NOTE — Discharge Instructions (Signed)
As we discussed all of your x-rays looked reassuring.  You told me that you were already aware of the cyst on your pancreas, there is no other acute findings that would be causing pain or vomiting.  I would like for you to take the following medications  Zofran is a medication which can help with nausea.  You may take 4 mg by mouth every 6 hours as needed if you are an adult, if your child under the age of 6 take half of a tablet or 2 mg every 6 hours as needed.  This should dissolve on your tongue within a short timeframe.  Wait about 30 minutes after taking it to help with drinking clear liquids.  I have prescribed a medication called Protonix which is a medication that is used to treat acid reflux.  These type of medications will help to reduce the amount of acid in your stomach and if taken regularly they will help reduce your risk of stomach ulcers and your symptoms are related to acid reflux.  It is important that you take this medication at the same time every day.  It is also important to know that you can take this safely with medicine such as Pepcid.  It is similar to medications called omeprazole which is over-the-counter so if you choose to use that instead if it is a lower cost that is okay.  I want you to follow-up with your family doctor for further evaluation if this medication is not helping but return to the emergency department for severe or worsening symptoms  Please go to dialysis tomorrow and make sure that you are returning to the emergency department for severe or worsening symptoms

## 2022-04-29 NOTE — ED Triage Notes (Signed)
Vomiting and abdomen pain since last night. Pt unable to keep her meds down.

## 2022-04-29 NOTE — ED Notes (Signed)
Doesn't make urine

## 2022-05-02 ENCOUNTER — Encounter: Payer: Self-pay | Admitting: Vascular Surgery

## 2022-05-02 ENCOUNTER — Ambulatory Visit (INDEPENDENT_AMBULATORY_CARE_PROVIDER_SITE_OTHER): Payer: Medicare Other | Admitting: Vascular Surgery

## 2022-05-02 VITALS — BP 177/67 | HR 76 | Temp 97.5°F | Ht 59.0 in | Wt 100.2 lb

## 2022-05-02 DIAGNOSIS — N186 End stage renal disease: Secondary | ICD-10-CM

## 2022-05-02 NOTE — Progress Notes (Signed)
   [facility] [place]  Patient name: Felicia Williams MRN: [id number] DOB: 15-Oct-1940 Sex: female  REASON FOR VISIT: Follow-up placement of left upper arm AV Gore-Tex graft and right IJ tunneled catheter on 03/20/2022  HPI: Felicia Williams is a 81 y.o. female in today for follow-up.  She underwent placement of new left upper arm AV Gore-Tex graft and tunneled catheter by myself on 03/20/2022 at [facility].  She had PD catheter use for 5 years but it had complications requiring conversion to hemodialysis.  She reports that she is had 1 hospitalization and 1 ER visits since that time with fluid overload.  She reports that she has had no difficulty with use of her catheter.  Current Outpatient Medications  Medication Sig Dispense Refill   acetaminophen (TYLENOL) 325 MG tablet Take 2 tablets (650 mg total) by mouth every 6 (six) hours as needed for mild pain.     camphor-menthol (SARNA) lotion Apply topically as needed for itching. (Patient taking differently: Apply 1 Application topically as needed for itching.) 222 mL 0   carvedilol (COREG) 12.5 MG tablet Take 1 tablet by mouth 2 (two) times daily.     doxycycline (VIBRA-TABS) 100 MG tablet Take 1 tablet (100 mg total) by mouth every 12 (twelve) hours. 6 tablet 0   famotidine (PEPCID) 40 MG tablet Take 1 tablet (40 mg total) by mouth at bedtime. 30 tablet 1   hydrALAZINE (APRESOLINE) 50 MG tablet Take 50 mg by mouth 2 (two) times daily.     HYDROcodone-acetaminophen (NORCO) 10-325 MG tablet Take 1 tablet by mouth 2 (two) times daily as needed for moderate pain.     Lactobacillus Probiotic TABS Take 1 capsule by mouth daily.     loperamide (IMODIUM) 2 MG capsule Take 1 capsule (2 mg total) by mouth as needed for diarrhea or loose stools. (Patient taking differently: Take 2 mg by mouth in the morning and at bedtime.) 30 capsule 0   multivitamin (RENA-VIT) TABS tablet Take 1 tablet by  mouth at bedtime. 30 tablet 0   ondansetron (ZOFRAN-ODT) 4 MG disintegrating tablet Take 1 tablet (4 mg total) by mouth every 8 (eight) hours as needed for nausea. 10 tablet 0   pantoprazole (PROTONIX) 40 MG tablet Take 1 tablet (40 mg total) by mouth daily. 30 tablet 1   No current facility-administered medications for this visit.     PHYSICAL EXAM: Vitals:   05/02/22 0940  BP: (!) 177/67  Pulse: 76  Temp: (!) 97.5 F (36.4 C)  SpO2: 96%  Weight: 100 lb 3.2 oz (45.5 kg)  Height: 4\' 11"  (1.499 m)    GENERAL: The patient is a well-nourished female, in no acute distress. The vital signs are documented above. Left upper arm AV graft incisions well-healed with excellent thrill.  She does have a palpable left radial pulse and no steal symptoms.  MEDICAL ISSUES: Status post left upper arm AV graft placement 5 to 6 weeks ago.  I feel it is safe to use her left upper arm graft at any time.  I am available for removal of her tunneled catheter once several successful sessions with hemodialysis.   Rosetta Posner, MD FACS [facility] [phone]  Note: Portions of this report may have been transcribed using voice recognition software.  Every effort has been made to ensure accuracy; however, inadvertent computerized transcription errors may still be present.

## 2022-05-02 NOTE — H&P (View-Only) (Signed)
   [facility] [place]  Patient name: Felicia Williams MRN: [id number] DOB: Nov 27, 1940 Sex: female  REASON FOR VISIT: Follow-up placement of left upper arm AV Gore-Tex graft and right IJ tunneled catheter on 03/20/2022  HPI: Felicia Williams is a 81 y.o. female in today for follow-up.  She underwent placement of new left upper arm AV Gore-Tex graft and tunneled catheter by myself on 03/20/2022 at [facility].  She had PD catheter use for 5 years but it had complications requiring conversion to hemodialysis.  She reports that she is had 1 hospitalization and 1 ER visits since that time with fluid overload.  She reports that she has had no difficulty with use of her catheter.  Current Outpatient Medications  Medication Sig Dispense Refill   acetaminophen (TYLENOL) 325 MG tablet Take 2 tablets (650 mg total) by mouth every 6 (six) hours as needed for mild pain.     camphor-menthol (SARNA) lotion Apply topically as needed for itching. (Patient taking differently: Apply 1 Application topically as needed for itching.) 222 mL 0   carvedilol (COREG) 12.5 MG tablet Take 1 tablet by mouth 2 (two) times daily.     doxycycline (VIBRA-TABS) 100 MG tablet Take 1 tablet (100 mg total) by mouth every 12 (twelve) hours. 6 tablet 0   famotidine (PEPCID) 40 MG tablet Take 1 tablet (40 mg total) by mouth at bedtime. 30 tablet 1   hydrALAZINE (APRESOLINE) 50 MG tablet Take 50 mg by mouth 2 (two) times daily.     HYDROcodone-acetaminophen (NORCO) 10-325 MG tablet Take 1 tablet by mouth 2 (two) times daily as needed for moderate pain.     Lactobacillus Probiotic TABS Take 1 capsule by mouth daily.     loperamide (IMODIUM) 2 MG capsule Take 1 capsule (2 mg total) by mouth as needed for diarrhea or loose stools. (Patient taking differently: Take 2 mg by mouth in the morning and at bedtime.) 30 capsule 0   multivitamin (RENA-VIT) TABS tablet Take 1 tablet by  mouth at bedtime. 30 tablet 0   ondansetron (ZOFRAN-ODT) 4 MG disintegrating tablet Take 1 tablet (4 mg total) by mouth every 8 (eight) hours as needed for nausea. 10 tablet 0   pantoprazole (PROTONIX) 40 MG tablet Take 1 tablet (40 mg total) by mouth daily. 30 tablet 1   No current facility-administered medications for this visit.     PHYSICAL EXAM: Vitals:   05/02/22 0940  BP: (!) 177/67  Pulse: 76  Temp: (!) 97.5 F (36.4 C)  SpO2: 96%  Weight: 100 lb 3.2 oz (45.5 kg)  Height: 4\' 11"  (1.499 m)    GENERAL: The patient is a well-nourished female, in no acute distress. The vital signs are documented above. Left upper arm AV graft incisions well-healed with excellent thrill.  She does have a palpable left radial pulse and no steal symptoms.  MEDICAL ISSUES: Status post left upper arm AV graft placement 5 to 6 weeks ago.  I feel it is safe to use her left upper arm graft at any time.  I am available for removal of her tunneled catheter once several successful sessions with hemodialysis.   Rosetta Posner, MD FACS [facility] [phone]  Note: Portions of this report may have been transcribed using voice recognition software.  Every effort has been made to ensure accuracy; however, inadvertent computerized transcription errors may still be present.

## 2022-05-08 ENCOUNTER — Telehealth (HOSPITAL_COMMUNITY): Payer: Self-pay | Admitting: *Deleted

## 2022-05-08 NOTE — Telephone Encounter (Signed)
Faxed request from Dr Theador Hawthorne for tunneled catheter removal by Dr. Donnetta Hutching in [place]. Will give to [facility].

## 2022-05-10 ENCOUNTER — Other Ambulatory Visit: Payer: Self-pay

## 2022-05-21 ENCOUNTER — Other Ambulatory Visit: Payer: Self-pay | Admitting: *Deleted

## 2022-05-22 ENCOUNTER — Encounter (HOSPITAL_COMMUNITY)
Admission: RE | Disposition: A | Discharge status: Home / Self Care | Payer: Self-pay | Source: Ambulatory Visit | Attending: Vascular Surgery

## 2022-05-22 ENCOUNTER — Other Ambulatory Visit: Payer: Self-pay

## 2022-05-22 ENCOUNTER — Encounter (HOSPITAL_COMMUNITY): Payer: Self-pay | Admitting: Vascular Surgery

## 2022-05-22 ENCOUNTER — Ambulatory Visit (HOSPITAL_COMMUNITY)
Admission: RE | Admit: 2022-05-22 | Discharge: 2022-05-22 | Disposition: A | Discharge status: Home / Self Care | Payer: Medicare Other | Source: Ambulatory Visit | Attending: Vascular Surgery | Admitting: Vascular Surgery

## 2022-05-22 DIAGNOSIS — N186 End stage renal disease: Secondary | ICD-10-CM | POA: Diagnosis not present

## 2022-05-22 DIAGNOSIS — N185 Chronic kidney disease, stage 5: Secondary | ICD-10-CM | POA: Diagnosis not present

## 2022-05-22 DIAGNOSIS — Z95828 Presence of other vascular implants and grafts: Secondary | ICD-10-CM | POA: Diagnosis not present

## 2022-05-22 HISTORY — PX: REMOVAL OF A DIALYSIS CATHETER: SHX6053

## 2022-05-22 SURGERY — REMOVAL, DIALYSIS CATHETER
Anesthesia: LOCAL | Site: Chest

## 2022-05-22 MED ORDER — LIDOCAINE HCL (PF) 1 % IJ SOLN
INTRAMUSCULAR | Status: AC
  Filled 2022-05-22: qty 30

## 2022-05-22 MED ORDER — LIDOCAINE HCL (PF) 1 % IJ SOLN
INTRAMUSCULAR | Status: DC | PRN
  Administered 2022-05-22: 9 mL

## 2022-05-22 SURGICAL SUPPLY — 20 items
ADH SKN CLS APL DERMABOND .7 (GAUZE/BANDAGES/DRESSINGS)
APL PRP STRL LF ISPRP CHG 10.5 (MISCELLANEOUS) ×1
APPLICATOR CHLORAPREP 10.5 ORG (MISCELLANEOUS) ×1 IMPLANT
CLOTH BEACON ORANGE TIMEOUT ST (SAFETY) ×1 IMPLANT
DECANTER SPIKE VIAL GLASS SM (MISCELLANEOUS) ×1 IMPLANT
DERMABOND ADVANCED .7 DNX12 (GAUZE/BANDAGES/DRESSINGS) ×1 IMPLANT
DRAPE HALF SHEET 40X57 (DRAPES) IMPLANT
ELECT REM PT RETURN 9FT ADLT (ELECTROSURGICAL) ×1
ELECTRODE REM PT RTRN 9FT ADLT (ELECTROSURGICAL) ×1 IMPLANT
GLOVE BIOGEL PI IND STRL 7.0 (GLOVE) ×2 IMPLANT
GOWN STRL REUS W/TWL LRG LVL3 (GOWN DISPOSABLE) IMPLANT
NDL HYPO 25X1 1.5 SAFETY (NEEDLE) ×1 IMPLANT
NEEDLE HYPO 25X1 1.5 SAFETY (NEEDLE) ×1 IMPLANT
PENCIL SMOKE EVACUATOR COATED (MISCELLANEOUS) IMPLANT
SPONGE GAUZE 2X2 8PLY STRL LF (GAUZE/BANDAGES/DRESSINGS) ×1 IMPLANT
SUT MNCRL AB 4-0 PS2 18 (SUTURE) ×1 IMPLANT
SUT VIC AB 3-0 SH 27 (SUTURE) ×1
SUT VIC AB 3-0 SH 27X BRD (SUTURE) ×1 IMPLANT
SYR CONTROL 10ML LL (SYRINGE) ×1 IMPLANT
TOWEL OR 17X26 4PK STRL BLUE (TOWEL DISPOSABLE) ×1 IMPLANT

## 2022-05-22 NOTE — Op Note (Signed)
    OPERATIVE REPORT  DATE OF SURGERY: 05/22/2022  PATIENT: Felicia Williams, 81 y.o. female MRN: [id number]  DOB: Aug 12, 1940  PRE-OPERATIVE DIAGNOSIS: End-stage renal disease with functioning left upper arm AV tex graft  POST-OPERATIVE DIAGNOSIS:  Same  PROCEDURE: Removal of right IJ tunneled hemodialysis catheter  SURGEON:  Curt Jews, M.D.  PHYSICIAN ASSISTANT: Nurse  The assistant was needed for exposure and to expedite the case  ANESTHESIA: 1% lidocaine local  EBL: per anesthesia record  No intake/output data recorded.  BLOOD ADMINISTERED: none  DRAINS: none  SPECIMEN: none  COUNTS CORRECT:  YES  PATIENT DISPOSITION:  PACU - hemodynamically stable  PROCEDURE DETAILS: Patient was taken to the minor procedure room.  The area of the right neck and catheter were prepped and draped in usual sterile fashion.  Local anesthesia was used at the exit site of the catheter around the Dacron cuff.  The Dacron cuff was mobilized with blunt dissection and the catheter was removed in its entirety.  Pressure was held for hemostasis.  A sterile dressing was applied and the patient was discharged to home   Rosetta Posner, M.D., [facility] 05/22/2022 7:55 AM  Note: Portions of this report may have been transcribed using voice recognition software.  Every effort has been made to ensure accuracy; however, inadvertent computerized transcription errors may still be present.

## 2022-05-22 NOTE — Interval H&P Note (Signed)
History and Physical Interval Note:  05/22/2022 7:54 AM  Felicia Williams  has presented today for surgery, with the diagnosis of ESRD.  The various methods of treatment have been discussed with the patient and family. After consideration of risks, benefits and other options for treatment, the patient has consented to  Procedure(s): MINOR REMOVAL OF A TUNNELED DIALYSIS CATHETER (N/A) as a surgical intervention.  The patient's history has been reviewed, patient examined, no change in status, stable for surgery.  I have reviewed the patient's chart and labs.  Questions were answered to the patient's satisfaction.     Curt Jews

## 2022-05-24 ENCOUNTER — Encounter (HOSPITAL_COMMUNITY): Payer: Self-pay | Admitting: Vascular Surgery

## 2022-06-11 ENCOUNTER — Encounter (INDEPENDENT_AMBULATORY_CARE_PROVIDER_SITE_OTHER): Payer: Self-pay | Admitting: Gastroenterology

## 2022-07-11 ENCOUNTER — Observation Stay (HOSPITAL_COMMUNITY)
Admission: EM | Admit: 2022-07-11 | Discharge: 2022-07-12 | Disposition: A | Discharge status: Home / Self Care | Payer: Medicare Other | Attending: Internal Medicine | Admitting: Internal Medicine

## 2022-07-11 ENCOUNTER — Emergency Department (HOSPITAL_COMMUNITY): Payer: Medicare Other

## 2022-07-11 ENCOUNTER — Encounter (HOSPITAL_COMMUNITY): Payer: Self-pay | Admitting: *Deleted

## 2022-07-11 ENCOUNTER — Other Ambulatory Visit: Payer: Self-pay

## 2022-07-11 DIAGNOSIS — R059 Cough, unspecified: Secondary | ICD-10-CM | POA: Insufficient documentation

## 2022-07-11 DIAGNOSIS — I509 Heart failure, unspecified: Secondary | ICD-10-CM | POA: Diagnosis not present

## 2022-07-11 DIAGNOSIS — J9601 Acute respiratory failure with hypoxia: Principal | ICD-10-CM | POA: Insufficient documentation

## 2022-07-11 DIAGNOSIS — K219 Gastro-esophageal reflux disease without esophagitis: Secondary | ICD-10-CM | POA: Insufficient documentation

## 2022-07-11 DIAGNOSIS — Z79899 Other long term (current) drug therapy: Secondary | ICD-10-CM | POA: Insufficient documentation

## 2022-07-11 DIAGNOSIS — R0602 Shortness of breath: Secondary | ICD-10-CM | POA: Insufficient documentation

## 2022-07-11 DIAGNOSIS — I169 Hypertensive crisis, unspecified: Secondary | ICD-10-CM | POA: Diagnosis not present

## 2022-07-11 DIAGNOSIS — Z1152 Encounter for screening for COVID-19: Secondary | ICD-10-CM | POA: Insufficient documentation

## 2022-07-11 DIAGNOSIS — N186 End stage renal disease: Secondary | ICD-10-CM | POA: Insufficient documentation

## 2022-07-11 DIAGNOSIS — E875 Hyperkalemia: Secondary | ICD-10-CM | POA: Insufficient documentation

## 2022-07-11 DIAGNOSIS — R109 Unspecified abdominal pain: Secondary | ICD-10-CM | POA: Diagnosis present

## 2022-07-11 DIAGNOSIS — R0902 Hypoxemia: Secondary | ICD-10-CM

## 2022-07-11 DIAGNOSIS — I132 Hypertensive heart and chronic kidney disease with heart failure and with stage 5 chronic kidney disease, or end stage renal disease: Secondary | ICD-10-CM | POA: Diagnosis not present

## 2022-07-11 DIAGNOSIS — Z992 Dependence on renal dialysis: Secondary | ICD-10-CM | POA: Insufficient documentation

## 2022-07-11 DIAGNOSIS — D631 Anemia in chronic kidney disease: Secondary | ICD-10-CM | POA: Diagnosis not present

## 2022-07-11 HISTORY — DX: Heart failure, unspecified: I50.9

## 2022-07-11 LAB — CBC WITH DIFFERENTIAL/PLATELET
Abs Immature Granulocytes: 0.07 10*3/uL (ref 0.00–0.07)
Basophils Absolute: 0 10*3/uL (ref 0.0–0.1)
Basophils Relative: 0 %
Eosinophils Absolute: 0.1 10*3/uL (ref 0.0–0.5)
Eosinophils Relative: 1 %
HCT: 30.5 % — ABNORMAL LOW (ref 36.0–46.0)
Hemoglobin: 9.9 g/dL — ABNORMAL LOW (ref 12.0–15.0)
Immature Granulocytes: 1 %
Lymphocytes Relative: 5 %
Lymphs Abs: 0.6 10*3/uL — ABNORMAL LOW (ref 0.7–4.0)
MCH: 30.1 pg (ref 26.0–34.0)
MCHC: 32.5 g/dL (ref 30.0–36.0)
MCV: 92.7 fL (ref 80.0–100.0)
Monocytes Absolute: 0.8 10*3/uL (ref 0.1–1.0)
Monocytes Relative: 6 %
Neutro Abs: 11.7 10*3/uL — ABNORMAL HIGH (ref 1.7–7.7)
Neutrophils Relative %: 87 %
Platelets: 255 10*3/uL (ref 150–400)
RBC: 3.29 MIL/uL — ABNORMAL LOW (ref 3.87–5.11)
RDW: 17.9 % — ABNORMAL HIGH (ref 11.5–15.5)
WBC: 13.2 10*3/uL — ABNORMAL HIGH (ref 4.0–10.5)
nRBC: 0 % (ref 0.0–0.2)

## 2022-07-11 LAB — COMPREHENSIVE METABOLIC PANEL
ALT: 16 U/L (ref 0–44)
AST: 24 U/L (ref 15–41)
Albumin: 3.1 g/dL — ABNORMAL LOW (ref 3.5–5.0)
Alkaline Phosphatase: 68 U/L (ref 38–126)
Anion gap: 14 (ref 5–15)
BUN: 68 mg/dL — ABNORMAL HIGH (ref 8–23)
CO2: 23 mmol/L (ref 22–32)
Calcium: 8.4 mg/dL — ABNORMAL LOW (ref 8.9–10.3)
Chloride: 101 mmol/L (ref 98–111)
Creatinine, Ser: 7.47 mg/dL — ABNORMAL HIGH (ref 0.44–1.00)
GFR, Estimated: 5 mL/min — ABNORMAL LOW (ref >60)
Glucose, Bld: 106 mg/dL — ABNORMAL HIGH (ref 70–99)
Potassium: 5.3 mmol/L — ABNORMAL HIGH (ref 3.5–5.1)
Sodium: 138 mmol/L (ref 135–145)
Total Bilirubin: 1 mg/dL (ref 0.3–1.2)
Total Protein: 6.8 g/dL (ref 6.5–8.1)

## 2022-07-11 LAB — PROCALCITONIN: Procalcitonin: 0.28 ng/mL

## 2022-07-11 LAB — RESP PANEL BY RT-PCR (RSV, FLU A&B, COVID)  RVPGX2
Influenza A by PCR: NEGATIVE
Influenza B by PCR: NEGATIVE
Resp Syncytial Virus by PCR: NEGATIVE
SARS Coronavirus 2 by RT PCR: NEGATIVE

## 2022-07-11 LAB — LIPASE, BLOOD: Lipase: 39 U/L (ref 11–51)

## 2022-07-11 MED ORDER — MORPHINE SULFATE (PF) 2 MG/ML IV SOLN: 2.0000 mg | INTRAVENOUS | Status: DC | PRN

## 2022-07-11 MED ORDER — OXYCODONE HCL 5 MG PO TABS: 5.0000 mg | ORAL_TABLET | ORAL | Status: DC | PRN

## 2022-07-11 MED ORDER — LIDOCAINE HCL (PF) 1 % IJ SOLN: 5.0000 mL | INTRAMUSCULAR | Status: DC | PRN

## 2022-07-11 MED ORDER — IOHEXOL 350 MG/ML SOLN
80.0000 mL | Freq: Once | INTRAVENOUS | Status: AC | PRN
  Administered 2022-07-11: 80 mL via INTRAVENOUS

## 2022-07-11 MED ORDER — PENTAFLUOROPROP-TETRAFLUOROETH EX AERO: 1.0000 | INHALATION_SPRAY | CUTANEOUS | Status: DC | PRN

## 2022-07-11 MED ORDER — PANTOPRAZOLE SODIUM 40 MG PO TBEC
40.0000 mg | DELAYED_RELEASE_TABLET | Freq: Every day | ORAL | Status: DC
  Administered 2022-07-11 – 2022-07-12 (×2): 40 mg via ORAL
  Filled 2022-07-11 (×2): qty 1

## 2022-07-11 MED ORDER — ONDANSETRON HCL 4 MG PO TABS: 4.0000 mg | ORAL_TABLET | Freq: Four times a day (QID) | ORAL | Status: DC | PRN

## 2022-07-11 MED ORDER — ACETAMINOPHEN 325 MG PO TABS: 650.0000 mg | ORAL_TABLET | Freq: Four times a day (QID) | ORAL | Status: DC | PRN

## 2022-07-11 MED ORDER — IOHEXOL 300 MG/ML  SOLN: 80.0000 mL | Freq: Once | INTRAMUSCULAR | Status: DC | PRN

## 2022-07-11 MED ORDER — TRAZODONE HCL 50 MG PO TABS
100.0000 mg | ORAL_TABLET | Freq: Every day | ORAL | Status: DC
  Administered 2022-07-11: 100 mg via ORAL
  Filled 2022-07-11: qty 2

## 2022-07-11 MED ORDER — FAMOTIDINE 20 MG PO TABS
40.0000 mg | ORAL_TABLET | Freq: Every day | ORAL | Status: DC
  Filled 2022-07-11: qty 2

## 2022-07-11 MED ORDER — LOPERAMIDE HCL 2 MG PO CAPS: 2.0000 mg | ORAL_CAPSULE | Freq: Once | ORAL | Status: DC

## 2022-07-11 MED ORDER — ALBUTEROL SULFATE (2.5 MG/3ML) 0.083% IN NEBU: 2.5000 mg | INHALATION_SOLUTION | RESPIRATORY_TRACT | Status: DC | PRN

## 2022-07-11 MED ORDER — CARVEDILOL 12.5 MG PO TABS
12.5000 mg | ORAL_TABLET | Freq: Two times a day (BID) | ORAL | Status: DC
  Administered 2022-07-11 – 2022-07-12 (×2): 12.5 mg via ORAL
  Filled 2022-07-11 (×2): qty 1

## 2022-07-11 MED ORDER — FAMOTIDINE 20 MG PO TABS: 40.0000 mg | ORAL_TABLET | Freq: Every evening | ORAL | Status: DC | PRN

## 2022-07-11 MED ORDER — ACETAMINOPHEN 650 MG RE SUPP: 650.0000 mg | Freq: Four times a day (QID) | RECTAL | Status: DC | PRN

## 2022-07-11 MED ORDER — ONDANSETRON HCL 4 MG/2ML IJ SOLN: 4.0000 mg | Freq: Four times a day (QID) | INTRAMUSCULAR | Status: DC | PRN

## 2022-07-11 MED ORDER — HEPARIN SODIUM (PORCINE) 5000 UNIT/ML IJ SOLN
5000.0000 [IU] | Freq: Three times a day (TID) | INTRAMUSCULAR | Status: DC
  Administered 2022-07-11 – 2022-07-12 (×2): 5000 [IU] via SUBCUTANEOUS
  Filled 2022-07-11 (×2): qty 1

## 2022-07-11 MED ORDER — LIDOCAINE-PRILOCAINE 2.5-2.5 % EX CREA: 1.0000 | TOPICAL_CREAM | CUTANEOUS | Status: DC | PRN

## 2022-07-11 MED ORDER — CHLORHEXIDINE GLUCONATE CLOTH 2 % EX PADS: 6.0000 | MEDICATED_PAD | Freq: Every day | CUTANEOUS | Status: DC

## 2022-07-11 NOTE — ED Notes (Signed)
Pt gone to dialysis 

## 2022-07-11 NOTE — ED Notes (Signed)
Dr. Oswald Hillock made aware of O2 sats.

## 2022-07-11 NOTE — ED Provider Notes (Signed)
[facility] EMERGENCY DEPARTMENT Provider Note   CSN: [id number] Arrival date & time: 07/11/22  6629     History Chief Complaint  Patient presents with   Abdominal Pain    HPI Felicia Williams is a 81 y.o. female presenting for chief complaint of abdominal pain.  She states that she has had left lower quadrant pain states that it has been bothering her over the weekend but slightly worse today.  She skipped her dialysis session to come here today.  She denies fevers or chills, nausea vomiting, syncope or shortness of breath.  Patient's recorded medical, surgical, social, medication list and allergies were reviewed in the Snapshot window as part of the initial history.   Review of Systems   Review of Systems  Constitutional:  Negative for chills and fever.  HENT:  Negative for ear pain and sore throat.   Eyes:  Negative for pain and visual disturbance.  Respiratory:  Positive for cough and shortness of breath.   Cardiovascular:  Negative for chest pain and palpitations.  Gastrointestinal:  Negative for abdominal pain and vomiting.  Genitourinary:  Negative for dysuria and hematuria.  Musculoskeletal:  Negative for arthralgias and back pain.  Skin:  Negative for color change and rash.  Neurological:  Negative for seizures and syncope.  All other systems reviewed and are negative.   Physical Exam Updated Vital Signs BP (!) 197/83   Pulse 82   Temp 98.2 F (36.8 C) (Oral)   Resp (!) 27   Ht 4\' 11"  (1.499 m)   Wt 43.5 kg   SpO2 97%   BMI 19.39 kg/m  Physical Exam Vitals and nursing note reviewed.  Constitutional:      General: She is not in acute distress.    Appearance: She is well-developed.  HENT:     Head: Normocephalic and atraumatic.  Eyes:     Conjunctiva/sclera: Conjunctivae normal.  Cardiovascular:     Rate and Rhythm: Normal rate and regular rhythm.     Heart sounds: No murmur heard. Pulmonary:     Effort: Pulmonary effort is normal. No respiratory  distress.     Breath sounds: Normal breath sounds.  Abdominal:     General: There is no distension.     Palpations: Abdomen is soft.     Tenderness: There is no abdominal tenderness. There is no right CVA tenderness or left CVA tenderness.  Musculoskeletal:        General: No swelling or tenderness. Normal range of motion.     Cervical back: Neck supple.  Skin:    General: Skin is warm and dry.     Capillary Refill: Capillary refill takes less than 2 seconds.  Neurological:     General: No focal deficit present.     Mental Status: She is alert and oriented to person, place, and time. Mental status is at baseline.     Cranial Nerves: No cranial nerve deficit.  Psychiatric:        Mood and Affect: Mood normal.      ED Course/ Medical Decision Making/ A&P    Procedures Procedures   Medications Ordered in ED Medications  iohexol (OMNIPAQUE) 300 MG/ML solution 80 mL ( Intravenous Canceled Entry 07/11/22 1219)  iohexol (OMNIPAQUE) 350 MG/ML injection 80 mL (80 mLs Intravenous Contrast Given 07/11/22 1226)   \Medical Decision Making:   Felicia Williams is a 81 y.o. female who presented to the ED today with abdominal pain, detailed above.    Patient's presentation is complicated  by their history of multiple comorbid medical problems.  Patient placed on continuous vitals and telemetry monitoring while in ED which was reviewed periodically.  Complete initial physical exam performed, notably the patient  was HDS in NAD.     Reviewed and confirmed nursing documentation for past medical history, family history, social history.    Initial Assessment:   With the patient's presentation of abdominal pain, most likely diagnosis is nonspecific SOB. Other diagnoses were considered including (but not limited to) gastroenteritis, colitis, small bowel obstruction, appendicitis, cholecystitis, pancreatitis, nephrolithiasis, UTI, pyleonephritis These are considered less likely due to history of  present illness and physical exam findings.   This is most consistent with an acute life/limb threatening illness complicated by underlying chronic conditions.   Initial Plan:  CBC/CMP to evaluate for underlying infectious/metabolic etiology for patient's abdominal pain  Lipase to evaluate for pancreatitis  EKG to evaluate for cardiac source of pain  CTAB/Pelvis with contrast to evaluate for structural/surgical etiology of patients' severe abdominal pain.  Aorta follow through to ensure no AAS Urinalysis and repeat physical assessment to evaluate for UTI/Pyelonpehritis  Empiric management of symptoms with escalating pain control and antiemetics as needed.   Initial Study Results:   Laboratory  All laboratory results reviewed without evidence of clinically relevant pathology.   Exceptions include: Leukocytosis    EKG EKG was reviewed independently. Rate, rhythm, axis, intervals all examined and without medically relevant abnormality. ST segments without concerns for elevations.    Radiology All images reviewed independently. Agree with radiology report at this time.   CT ANGIO CHEST/ABD/PEL FOR DISSECTION W &/OR WO CONTRAST  Result Date: 07/11/2022 CLINICAL DATA:  Acute aortic syndrome suspected, abdominal pain, diarrhea EXAM: CT ANGIOGRAPHY CHEST, ABDOMEN AND PELVIS TECHNIQUE: Non-contrast CT of the chest was initially obtained. Multidetector CT imaging through the chest, abdomen and pelvis was performed using the standard protocol during bolus administration of intravenous contrast. Multiplanar reconstructed images and MIPs were obtained and reviewed to evaluate the vascular anatomy. RADIATION DOSE REDUCTION: This exam was performed according to the departmental dose-optimization program which includes automated exposure control, adjustment of the mA and/or kV according to patient size and/or use of iterative reconstruction technique. CONTRAST:  36mL OMNIPAQUE IOHEXOL 350 MG/ML SOLN, <See  Chart> OMNIPAQUE IOHEXOL 300 MG/ML SOLN COMPARISON:  CT abdomen and pelvis done on done 823 FINDINGS: CTA CHEST FINDINGS Cardiovascular: There is no demonstrable mural hematoma in noncontrast images of thoracic aorta. Coronary artery calcifications are seen. Extensive calcifications are seen in thoracic aorta. There is homogeneous enhancement in thoracic aorta. Major branches of thoracic aorta appear patent. Mediastinum/Nodes: There are enlarged lymph nodes in mediastinum. There is inhomogeneous attenuation in thyroid with scattered calcifications. Lungs/Pleura: Moderate bilateral pleural effusions are seen with interval increase. There are patchy infiltrates in both lower lung fields suggesting atelectasis/pneumonia. There are faint ground-glass densities in parahilar regions. There is no pneumothorax. Musculoskeletal: No acute findings are seen. Review of the MIP images confirms the above findings. CTA ABDOMEN AND PELVIS FINDINGS VASCULAR Aorta: There is no evidence of dissection or focal aneurysmal dilation. There are scattered atherosclerotic plaques and calcifications. Celiac: Atherosclerotic plaques and calcifications are seen in celiac axis and its major branches. SMA: There are calcifications and atherosclerotic plaques with moderate narrowing of proximal course. Renals: There are calcifications and atherosclerotic plaques. Both renal arteries appear smaller than usual, possibly related to cortical atrophy in both kidneys. IMA: There is narrowing of the origin. Distal course of IMA is patent. Iliacs: There are  scattered atherosclerotic plaques and calcifications without signs of dissection or focal aneurysmal dilation. Veins: Unremarkable. Review of the MIP images confirms the above findings. NON-VASCULAR Hepatobiliary: There is fatty infiltration in liver. Gallbladder is distended. There is no wall thickening in gallbladder. There is no dilation of bile ducts. Pancreas: There is 1.7 cm smooth marginated  fluid density lesion in the body of pancreas. Similar finding was seen in previous studies dating as far back as MRI done on 12/09/2020. Spleen: Minimal ascites is noted adjacent to the spleen. Adrenals/Urinary Tract: Adrenals appear stable. There is atrophy in both kidneys suggesting medical renal disease. There is 12 mm fluid density structure in the upper pole of left kidney suggesting renal cyst. There are no renal or ureteral stones. Urinary bladder is not distended. Stomach/Bowel: Stomach is not distended. Small bowel loops are unremarkable. Appendix is not seen. There is no significant wall thickening in colon. Few diverticula are seen in colon without signs of focal diverticulitis. Lymphatic: There are slightly enlarged lymph nodes in retroperitoneum and mesentery. This may suggest reactive hyperplasia of lymph nodes. Reproductive: Unremarkable. Other: Small ascites is present.  There is no pneumoperitoneum. Musculoskeletal: There is first-degree anterolisthesis at L4-L5 level. There is moderate spinal stenosis at L4-L5 level along with encroachment of neural foramina. Review of the MIP images confirms the above findings. IMPRESSION: There is no evidence of dissection in thoracic and abdominal aorta. Major branches of thoracic and abdominal aorta are patent. Diffuse atherosclerotic changes are noted in thoracic and abdominal aorta. Coronary artery calcifications are seen. There are patchy infiltrates in both lower lung fields suggesting multifocal atelectasis/pneumonia. There are ground-glass densities in the parahilar regions, possibly suggesting pulmonary edema. Moderate bilateral pleural effusions are seen, more so on the right side with interval increase. Small ascites is seen. There is no evidence of intestinal obstruction or pneumoperitoneum. There is no hydronephrosis. There is 1.7 cm fluid density lesion in pancreas. Follow-up multiphasic CT or MRI in 1 year may be considered to assess stability.  Enlarged lymph nodes are seen in mediastinum and abdomen, possibly suggesting reactive hyperplasia. Fatty liver. Small ascites. Medical renal disease. Left renal cyst. 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 Few diverticula are seen in colon without signs of focal diverticulitis. Other findings as described in the body of the report. Electronically Signed   By: Elmer Picker M.D.   On: 07/11/2022 13:15     Consults: Case discussed with on call nephrology.   Final Reassessment and Plan:   Objective evaluation grossly reassuring for any intra-abdominal pathology.  She clinically appears significantly improved.  However she is volume overloaded on exam.  Is hypoxic secondary to her pulmonary edema and required titration on 2 L nasal cannula.  Consultation has been placed with nephrology.  Patient will need dialysis prior to being able to be safely discharged.  Patient handoff to oncoming provider at this time, pending reassessment from dialysis.     Clinical Impression:  1. Abdominal pain, unspecified abdominal location      Data Unavailable   Final Clinical Impression(s) / ED Diagnoses Final diagnoses:  Abdominal pain, unspecified abdominal location    Rx / DC Orders ED Discharge Orders     None         Tretha Sciara, MD 07/11/22 1537

## 2022-07-11 NOTE — ED Notes (Signed)
O2 @ 2L/[place] applied for sats 86-88% and pt c/o dyspnea, MD aware.

## 2022-07-11 NOTE — ED Provider Notes (Signed)
Patient had only 1.7 L taken off during dialysis.  The patient was having severe cramps.  She continued to be hypoxic after the dialysis with room air sats around 88%.  I spoke with nephrology and we will get her admitted to medicine and nephrology will see her again tomorrow   Milton Ferguson, MD 07/11/22 2137

## 2022-07-11 NOTE — Progress Notes (Signed)
Contacted by ER in regards to patient needing HD. Presented with abd pain however volume overloaded. Will arrange for HD today, discussed with HD RN. Patient will be potentially discharged post-HD. Please contact us if patient is to be admitted for full consultation. Please call with any questions/concerns.  Gean Quint, MD [facility]

## 2022-07-11 NOTE — Procedures (Signed)
  HEMODIALYSIS TREATMENT NOTE:  Arrived to [facility] via stretcher from ED.  A/O but with tachypnea and dyspnea at rest.  Saturating 94% on 2L via [place] but anxious and stating "I'm not getting enough air."  Consent for HD was obtained, AVG was cannulated without difficulty, and treatment was initiated with a 2-3L goal.  Also, O2 was ^ to 4L for pt's comfort.  She reported complete relief of dyspnea with removal of only 600 ml.  Goal was lowered from 3 to 2L due to leg cramps.  O2 was weaned off with room air sats 90-92%.  Goal was further reduced due to severe leg cramping which was relieved with heat, massage, and interruption of UF.  Pt reports she does not usually experience cramping with HD.  Max UF rate was <1300 ml/h.    At the end of HD, after blood return (300 cc bolus), she desaturated to 87-88% on RA.  She does not use home oxygen.  Denies dyspnea.    Total run time: 3 hours Net UF: 1.7 liters  Dr. Roderic Palau was messaged and pt was returned to [id number] to await disposition.  Hand-off was given to Noni Saupe, Therapist, sports.   Rockwell Alexandria, RN

## 2022-07-11 NOTE — ED Triage Notes (Signed)
Pt c/o neck pain and abdominal pain; pt having some diarrhea

## 2022-07-11 NOTE — ED Notes (Signed)
Patient transported to CT 

## 2022-07-12 ENCOUNTER — Ambulatory Visit (INDEPENDENT_AMBULATORY_CARE_PROVIDER_SITE_OTHER): Payer: Medicare Other | Admitting: Gastroenterology

## 2022-07-12 ENCOUNTER — Observation Stay (HOSPITAL_BASED_OUTPATIENT_CLINIC_OR_DEPARTMENT_OTHER): Payer: Medicare Other

## 2022-07-12 DIAGNOSIS — I169 Hypertensive crisis, unspecified: Secondary | ICD-10-CM | POA: Diagnosis not present

## 2022-07-12 DIAGNOSIS — K2100 Gastro-esophageal reflux disease with esophagitis, without bleeding: Secondary | ICD-10-CM | POA: Diagnosis not present

## 2022-07-12 DIAGNOSIS — J9601 Acute respiratory failure with hypoxia: Secondary | ICD-10-CM

## 2022-07-12 DIAGNOSIS — N186 End stage renal disease: Secondary | ICD-10-CM

## 2022-07-12 DIAGNOSIS — J810 Acute pulmonary edema: Secondary | ICD-10-CM | POA: Diagnosis not present

## 2022-07-12 DIAGNOSIS — E875 Hyperkalemia: Secondary | ICD-10-CM

## 2022-07-12 DIAGNOSIS — K219 Gastro-esophageal reflux disease without esophagitis: Secondary | ICD-10-CM | POA: Insufficient documentation

## 2022-07-12 DIAGNOSIS — Z992 Dependence on renal dialysis: Secondary | ICD-10-CM | POA: Diagnosis not present

## 2022-07-12 LAB — ECHOCARDIOGRAM COMPLETE
AR max vel: 2.24 cm2
AV Area VTI: 2.27 cm2
AV Area mean vel: 2.08 cm2
AV Mean grad: 3 mmHg
AV Peak grad: 5.6 mmHg
Ao pk vel: 1.18 m/s
Area-P 1/2: 3.58 cm2
Height: 59 in
MV VTI: 2.57 cm2
S' Lateral: 3.9 cm
Weight: 1580.26 oz

## 2022-07-12 LAB — COMPREHENSIVE METABOLIC PANEL
ALT: 13 U/L (ref 0–44)
AST: 21 U/L (ref 15–41)
Albumin: 2.6 g/dL — ABNORMAL LOW (ref 3.5–5.0)
Alkaline Phosphatase: 58 U/L (ref 38–126)
Anion gap: 10 (ref 5–15)
BUN: 34 mg/dL — ABNORMAL HIGH (ref 8–23)
CO2: 28 mmol/L (ref 22–32)
Calcium: 7.9 mg/dL — ABNORMAL LOW (ref 8.9–10.3)
Chloride: 98 mmol/L (ref 98–111)
Creatinine, Ser: 4.5 mg/dL — ABNORMAL HIGH (ref 0.44–1.00)
GFR, Estimated: 9 mL/min — ABNORMAL LOW (ref >60)
Glucose, Bld: 97 mg/dL (ref 70–99)
Potassium: 4 mmol/L (ref 3.5–5.1)
Sodium: 136 mmol/L (ref 135–145)
Total Bilirubin: 1 mg/dL (ref 0.3–1.2)
Total Protein: 5.7 g/dL — ABNORMAL LOW (ref 6.5–8.1)

## 2022-07-12 LAB — CBC WITH DIFFERENTIAL/PLATELET
Abs Immature Granulocytes: 0.03 K/uL (ref 0.00–0.07)
Basophils Absolute: 0 K/uL (ref 0.0–0.1)
Basophils Relative: 1 %
Eosinophils Absolute: 0.2 K/uL (ref 0.0–0.5)
Eosinophils Relative: 3 %
HCT: 27.4 % — ABNORMAL LOW (ref 36.0–46.0)
Hemoglobin: 8.9 g/dL — ABNORMAL LOW (ref 12.0–15.0)
Immature Granulocytes: 0 %
Lymphocytes Relative: 9 %
Lymphs Abs: 0.7 K/uL (ref 0.7–4.0)
MCH: 30.5 pg (ref 26.0–34.0)
MCHC: 32.5 g/dL (ref 30.0–36.0)
MCV: 93.8 fL (ref 80.0–100.0)
Monocytes Absolute: 0.6 K/uL (ref 0.1–1.0)
Monocytes Relative: 7 %
Neutro Abs: 6.4 K/uL (ref 1.7–7.7)
Neutrophils Relative %: 80 %
Platelets: 189 K/uL (ref 150–400)
RBC: 2.92 MIL/uL — ABNORMAL LOW (ref 3.87–5.11)
RDW: 17.7 % — ABNORMAL HIGH (ref 11.5–15.5)
WBC: 7.9 K/uL (ref 4.0–10.5)
nRBC: 0 % (ref 0.0–0.2)

## 2022-07-12 LAB — HEPATITIS B SURFACE ANTIGEN: Hepatitis B Surface Ag: NONREACTIVE

## 2022-07-12 LAB — HEPATITIS B SURFACE ANTIBODY, QUANTITATIVE: Hep B S AB Quant (Post): <3.1 m[IU]/mL — ABNORMAL LOW (ref >9.9)

## 2022-07-12 LAB — TROPONIN I (HIGH SENSITIVITY)
Troponin I (High Sensitivity): 234 ng/L
Troponin I (High Sensitivity): 198 ng/L

## 2022-07-12 LAB — MAGNESIUM: Magnesium: 1.6 mg/dL — ABNORMAL LOW (ref 1.7–2.4)

## 2022-07-12 LAB — IRON AND TIBC
Iron: 22 ug/dL — ABNORMAL LOW (ref 28–170)
Saturation Ratios: 15 % (ref 10.4–31.8)
TIBC: 148 ug/dL — ABNORMAL LOW (ref 250–450)
UIBC: 126 ug/dL

## 2022-07-12 LAB — FERRITIN: Ferritin: 373 ng/mL — ABNORMAL HIGH (ref 11–307)

## 2022-07-12 LAB — PHOSPHORUS: Phosphorus: 4.8 mg/dL — ABNORMAL HIGH (ref 2.5–4.6)

## 2022-07-12 MED ORDER — DARBEPOETIN ALFA 150 MCG/0.3ML IJ SOSY
150.0000 ug | PREFILLED_SYRINGE | Freq: Once | INTRAMUSCULAR | Status: AC
  Administered 2022-07-12: 150 ug via SUBCUTANEOUS
  Filled 2022-07-12: qty 0.3

## 2022-07-12 MED ORDER — CHLORHEXIDINE GLUCONATE CLOTH 2 % EX PADS
6.0000 | MEDICATED_PAD | Freq: Every day | CUTANEOUS | Status: DC
  Administered 2022-07-12: 6 via TOPICAL

## 2022-07-12 MED ORDER — HYDRALAZINE HCL 20 MG/ML IJ SOLN: 10.0000 mg | Freq: Four times a day (QID) | INTRAMUSCULAR | Status: DC | PRN

## 2022-07-12 NOTE — H&P (Signed)
History and Physical    Patient: Felicia Williams ACZ:[id number] DOB: [birth year] DOA: 07/11/2022 DOS: the patient was seen and examined on 07/12/2022 PCP: [name], NP  Patient coming from: Home  Chief Complaint:  Chief Complaint  Patient presents with   Abdominal Pain   HPI: Felicia Williams is a 81 y.o. female with medical history significant of anemia of chronic disease, CHF, ESRD on hemodialysis, hypertension, GERD, mixed hyperlipidemia-not on a statin, and more presents the ED with a chief complaint of nausea, vomiting, diarrhea.  Patient reports that the nausea and vomiting started 2 days ago.  It has been twice per day.  She denies any hematemesis or coffee-ground emesis.  She does have choking and coughing fits that are associated with vomiting episodes.  She denies any fever, chest pain, palpitations.  Patient reports that her diarrhea has been happening 4-5 times per day for a long time.  It is not associated with the vomiting, and is chronic.  She denies any abdominal pain at this time.  She reports that she did have crampy abdominal pain in the morning.  Patient wears no oxygen at home.  She is requiring oxygen during her hospitalization so far.  She has not had any dyspnea.  Patient reports that her last full dialysis session was on the 18th.  Plan was for her to have dialysis and then be discharged from the ER, but her session had to be cut short on the 20th due to cramping.  Nephrology was consulted from the ER and recommended admission to [facility] for them to evaluate in person in the a.m.  The thought is that her hypoxia is caused by pulmonary edema and pleural effusions will require further fluid removal.  Patient has no other complaints at this time.  Patient does not smoke, does not drink, does not use illicit drugs.  Her last COVID-vaccine was last year.  She is full code. Review of Systems: As mentioned in the history of present illness. All other systems reviewed  and are negative. 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 Laural Golden.   GERD (gastroesophageal reflux disease)    Glomerulonephritis    Gout    Mixed hyperlipidemia    Spondylosis    Past Surgical History:  Procedure Laterality Date   APPENDECTOMY     AV FISTULA PLACEMENT Left 03/20/2022   Procedure: INSERTION OF LEFT UPPER ARM ARTERIOVENOUS (AV) GORE-TEX GRAFT;  Surgeon: Rosetta Posner, MD;  Location: [facility];  Service: Vascular;  Laterality: Left;   CATARACT EXTRACTION     COLONOSCOPY N/A 12/28/2015   Procedure: COLONOSCOPY;  Surgeon: Rogene Houston, MD;  Location: [facility];  Service: Endoscopy;  Laterality: N/A;  815   ESOPHAGOGASTRODUODENOSCOPY N/A 11/16/2020   Procedure: ESOPHAGOGASTRODUODENOSCOPY (EGD);  Surgeon: Rogene Houston, MD;  Location: [facility];  Service: Endoscopy;  Laterality: N/A;  1:15   EXCHANGE OF A DIALYSIS CATHETER N/A 03/20/2022   Procedure: EXCHANGE OF A DIALYSIS CATHETER;  Surgeon: Rosetta Posner, MD;  Location: [facility];  Service: Vascular;  Laterality: N/A;   INSERTION OF DIALYSIS CATHETER Right 09/30/2021   Procedure:  INSERTION OF TUNNELED DIALYSIS CATHETER;  Surgeon: Waynetta Sandy, MD;  Location: [place];  Service: Vascular;  Laterality: Right;   IR FLUORO GUIDE CV LINE RIGHT  01/16/2019   IR REMOVAL TUN CV CATH W/O FL  07/01/2019   IR US GUIDE VASC ACCESS RIGHT  01/16/2019   POLYPECTOMY  11/16/2020   Procedure: POLYPECTOMY;  Surgeon: Rogene Houston, MD;  [facility];  Service: Endoscopy;;  gastric   REMOVAL OF A DIALYSIS CATHETER  09/30/2021   Procedure: REMOVAL OF A PERITONEAL DIALYSIS CATHETER;  Surgeon: Waynetta Sandy, MD;   Location: [place];  Service: Vascular;;   REMOVAL OF A DIALYSIS CATHETER N/A 05/22/2022   Procedure: MINOR REMOVAL OF A TUNNELED DIALYSIS CATHETER;  Surgeon: Rosetta Posner, MD;  Location: [facility];  Service: Vascular;  Laterality: N/A;   TUBAL LIGATION     Social History:  reports that she has never smoked. She has never been exposed to tobacco smoke. She has never used smokeless tobacco. She reports that she does not drink alcohol and does not use drugs.  Allergies  Allergen Reactions   Cefepime Other (See Comments)    Feb 2023 Encephalopathy with questionable seizures. Seems to be tolerating cefazolin    Morphine Other (See Comments)    "Dry heaving like crazy"   Sulfa Antibiotics Other (See Comments)    Shut pt's kidneys down    Family History  Problem Relation Age of Onset   CAD Father    Heart attack Father    Diabetes Mellitus II Father    Hypertension Father    Lupus Brother     Prior to Admission medications   Medication Sig Start Date End Date Taking? Authorizing Provider  acetaminophen (TYLENOL) 325 MG tablet Take 2 tablets (650 mg total) by mouth every 6 (six) hours as needed for mild pain. Patient taking differently: Take 325 mg by mouth every 6 (six) hours as needed for mild pain. 09/12/21  Yes Love, Ivan Anchors, PA-C  camphor-menthol [facility]) lotion Apply topically as needed for itching. Patient taking differently: Apply 1 Application topically as needed for itching. 09/12/21  Yes Love, Ivan Anchors, PA-C  carvedilol (COREG) 12.5 MG tablet Take 1 tablet by mouth 2 (two) times daily.   Yes [provider]  famotidine (PEPCID) 40 MG tablet Take 1 tablet (40 mg total) by mouth at bedtime. Patient taking differently: Take 40 mg by mouth at bedtime as needed for heartburn. 11/28/21  Yes Rehman, Mechele Dawley, MD  hydrALAZINE (APRESOLINE) 50 MG tablet Take 50 mg by mouth 2 (two) times daily. Do not take morning dose on Dialysis days. Monday,Wednesday and Friday   Yes [provider]  loperamide (IMODIUM) 2 MG capsule Take 1 capsule (2 mg total) by mouth as needed for diarrhea or loose stools. 09/12/21  Yes Love, Ivan Anchors, PA-C  pantoprazole (PROTONIX) 40 MG tablet Take 40 mg by mouth daily.   Yes [provider]  traZODone (DESYREL) 100 MG tablet Take 100 mg by mouth at bedtime. 07/07/22  Yes [provider]  doxycycline (VIBRA-TABS) 100 MG tablet Take 1 tablet (100 mg total) by mouth every 12 (twelve) hours. Patient not taking: Reported on 07/11/2022 04/26/22   Orson Eva, MD  ondansetron (ZOFRAN-ODT) 4 MG disintegrating tablet Take 1 tablet (4 mg total) by mouth every 8 (eight) hours as needed for nausea. Patient not taking: Reported on 07/11/2022 04/29/22   Noemi Chapel, MD  pantoprazole (PROTONIX) 40 MG tablet  Take 1 tablet (40 mg total) by mouth daily. Patient not taking: Reported on 07/11/2022 04/29/22 06/28/22  Noemi Chapel, MD    Physical Exam: Vitals:   07/11/22 2130 07/11/22 2230 07/11/22 2300 07/11/22 2341  BP: (!) 200/75 (!) 146/54 (!) 168/57 (!) 123/93  Pulse: 91 80 86 81  Resp: (!) 24 (!) 27 (!) 29 18  Temp:    98.2 F (36.8 C)  TempSrc:      SpO2: 96% 94% 94% 96%  Weight:    44.8 kg  Height:    4\' 11"  (1.499 m)   1.  General: Patient lying supine in bed,  no acute distress   2. Psychiatric: Alert and oriented x 3, mood and behavior normal for situation, pleasant and cooperative with exam   3. Neurologic: Speech and language are normal, face is symmetric, moves all 4 extremities voluntarily, at baseline without acute deficits on limited exam   4. HEENMT:  Head is atraumatic, normocephalic, pupils reactive to light, neck is supple, trachea is midline, mucous membranes are moist   5. Respiratory : Lungs are clear to auscultation bilaterally without wheezing, rhonchi, rales, no cyanosis, no increase in work of breathing or accessory muscle use   6. Cardiovascular : Heart rate normal, rhythm is regular, no rubs or  gallops, no peripheral edema, peripheral pulses palpated   7. Gastrointestinal:  Abdomen is soft, nondistended, only tender to palpation in the lower quadrants bilaterally, bowel sounds active, no masses or organomegaly palpated   8. Skin:  Skin is warm, dry and intact without rashes, acute lesions, or ulcers on limited exam   9.Musculoskeletal:  No acute deformities or trauma, no asymmetry in tone, no peripheral edema, peripheral pulses palpated, no tenderness to palpation in the extremities  Data Reviewed: In the ED 98, heart rate 81-90, respiratory rate 18-28, blood pressure 170/59-219/154, satting 88-100% Patient requiring 4 L nasal cannula Leukocytosis 13.2, hemoglobin 9.9, platelets 255 Chemistry shows a hyperkalemia at 5.3, elevated BUN at 68, elevated creatinine of 7.47>> defer to nephro COVID and flu negative CTA chest shows multifocal atelectasis versus pneumonia.  Pulmonary edema.  Moderate bilateral pleural effusions.  1.7 cm fluid density in pancreas that requires follow-up in 1 year.  No diverticulitis.  Patient's lipase was 39. Nephrology consulted from the ED and recommends admission to hospitalist service for evaluation in person in the a.m. after dialysis session had to be cut short  on 12/20 Assessment and Plan: * Acute respiratory failure with hypoxia ([place]) - Likely related to pulmonary edema and pleural effusions - Focal atelectasis versus pneumonia on CT, but procalcitonin is not indicative of bacterial infection at 0.28 - COVID and flu negative - Continue albuterol as needed - Wean off O2 as tolerated - Likely improved with dialysis session tomorrow - Also control blood pressure so that hypertensive crisis was not causing more pulmonary edema - Continue to monitor  ESRD on dialysis [facility]) - Normal dialysis Monday Wednesday Friday - Last full session dialysis Monday - Shorter session of dialysis Tuesday during her ER visit, 1.7 L removed - Tuesday dialysis  session cut short due to cramping - Nephro requested admission to hospitalist team with plan for nephro to see her in the a.m. - CT chest shows pulmonary edema and bilateral pleural effusions are likely causing her hypoxia - Likely will have further dialysis tomorrow for fluid removal  Hypertensive crisis - Blood pressure range in the ED 170/59-219/154 - Pulmonary edema on CTA - Leukocytosis at 13.2 - Continue  Coreg - Continue as needed hydralazine IV - Add on troponin - Continue to monitor  GERD (gastroesophageal reflux disease) - Continue  Pepcid and Protonix  Hyperkalemia - K+ initially 5.3 -HD done today with 1.7 L off.  -Repeat BMP in the AM -Monitor on tele -Defer to nephro       Advance Care Planning:   Code Status: Full Code   Consults: Nephrology  Family Communication: No family at bedside  Severity of Illness: The appropriate patient status for this patient is OBSERVATION. Observation status is judged to be reasonable and necessary in order to provide the required intensity of service to ensure the patient's safety. The patient's presenting symptoms, physical exam findings, and initial radiographic and laboratory data in the context of their medical condition is felt to place them at decreased risk for further clinical deterioration. Furthermore, it is anticipated that the patient will be medically stable for discharge from the hospital within 2 midnights of admission.   Author: Rolla Plate, DO 07/12/2022 1:11 AM  For on call review [email or web address].

## 2022-07-12 NOTE — Discharge Summary (Signed)
Physician Discharge Summary  Felicia Williams QVZ:[id number] DOB: 12-20-1940 DOA: 07/11/2022  PCP: [name], NP  Admit date: 07/11/2022 Discharge date: 07/12/2022  Admitted From: Home Disposition: Home  Recommendations for Outpatient Follow-up:  Follow up with PCP in 1-2 weeks Please obtain BMP/CBC in one week Follow up dialysis center on Saturday as previously scheduled  Home Health: Equipment/Devices: Oxygen at 2 L  Discharge Condition: Stable CODE STATUS: Full code code Diet recommendation: Heart healthy  Brief/Interim Summary: 81 year old female with a history of end-stage renal disease on hemodialysis, admitted to the hospital with shortness of breath and hypertension.  Noted to have volume overload.  Has had several instances where she has become short of breath in between dialysis sessions.  Seen by nephrology noted that large amount of volume removal can be challenging in this patient due to her small body size.  The patient was noted to be hypoxic on room air and qualifies for home oxygen.  She underwent dialysis in the hospital with improved proved and overall respiratory status.  Her next dialysis session scheduled at her dialysis center on Saturday.  Blood pressure overall is stable.  Discussed with nephrology and was felt reasonable to discharge patient today with home oxygen and have her follow-up at her regular dialysis center as scheduled.  Patient is also agreeable with this plan.  Discharge Diagnoses:  Principal Problem:   Acute respiratory failure with hypoxia ([place]) Active Problems:   ESRD on dialysis ([place])   Hyperkalemia   GERD (gastroesophageal reflux disease)   Hypertensive crisis    Discharge Instructions  Discharge Instructions     Diet - low sodium heart healthy   Complete by: As directed    Increase activity slowly   Complete by: As directed    No wound care   Complete by: As directed       Allergies as of 07/12/2022        Reactions   Cefepime Other (See Comments)   Feb 2023 Encephalopathy with questionable seizures. Seems to be tolerating cefazolin    Morphine Other (See Comments)   "Dry heaving like crazy"   Sulfa Antibiotics Other (See Comments)   Shut pt's kidneys down        Medication List     STOP taking these medications    doxycycline 100 MG tablet Commonly known as: VIBRA-TABS   ondansetron 4 MG disintegrating tablet Commonly known as: ZOFRAN-ODT       TAKE these medications    acetaminophen 325 MG tablet Commonly known as: TYLENOL Take 2 tablets (650 mg total) by mouth every 6 (six) hours as needed for mild pain. What changed: how much to take   camphor-menthol lotion Commonly known as: SARNA Apply topically as needed for itching. What changed: how much to take   carvedilol 12.5 MG tablet Commonly known as: COREG Take 1 tablet by mouth 2 (two) times daily.   famotidine 40 MG tablet Commonly known as: Pepcid Take 1 tablet (40 mg total) by mouth at bedtime. What changed:  when to take this reasons to take this   hydrALAZINE 50 MG tablet Commonly known as: APRESOLINE Take 50 mg by mouth 2 (two) times daily. Do not take morning dose on Dialysis days. Monday,Wednesday and Friday   loperamide 2 MG capsule Commonly known as: IMODIUM Take 1 capsule (2 mg total) by mouth as needed for diarrhea or loose stools.   pantoprazole 40 MG tablet Commonly known as: PROTONIX Take 40 mg by mouth daily. What  changed: Another medication with the same name was removed. Continue taking this medication, and follow the directions you see here.   traZODone 100 MG tablet Commonly known as: DESYREL Take 100 mg by mouth at bedtime.               Durable Medical Equipment  (From admission, onward)           Start     Ordered   07/12/22 1226  For home use only DME oxygen  Once       Question Answer Comment  Length of Need 6 Months   Mode or (Route) Nasal cannula   Liters  per Minute 2   Frequency Continuous (stationary and portable oxygen unit needed)   Oxygen conserving device Yes   Oxygen delivery system Gas      07/12/22 1225            Follow-up Information     Schedule an appointment as soon as possible for a visit  with [facility] with your PCP/Specialist as discussed.   Contact information: [email or web address] Call our physician referral line at [phone].               Allergies  Allergen Reactions   Cefepime Other (See Comments)    Feb 2023 Encephalopathy with questionable seizures. Seems to be tolerating cefazolin    Morphine Other (See Comments)    "Dry heaving like crazy"   Sulfa Antibiotics Other (See Comments)    Shut pt's kidneys down    Consultations: Nephrology   Procedures/Studies: ECHOCARDIOGRAM COMPLETE  Result Date: 07/12/2022    ECHOCARDIOGRAM REPORT   Patient Name:   Felicia Williams Date of Exam: 07/12/2022 Medical Rec #:  [id number]        Height:       59.0 in Accession #:    [id number]       Weight:       98.8 lb Date of Birth:  Feb 25, 1941        BSA:          1.367 m Patient Age:    24 years         BP:           155/60 mmHg Patient Gender: F                HR:           79 bpm. Exam Location:  [facility] Procedure: 2D Echo, Cardiac Doppler and Color Doppler Indications:    Pulmonary Edema  History:        Patient has prior history of Echocardiogram examinations, most                 recent 03/22/2016. Arrythmias:Tachycardia,                 Signs/Symptoms:Shortness of Breath; Risk Factors:Hypertension                 and Dyslipidemia. Palpitations, ESRD on Dialysis.  Sonographer:    Wenda Low Referring Phys: [id number] ASIA B [place]  1. Left ventricular ejection fraction, by estimation, is 50 to 55%. The left ventricle has low normal function. Left ventricular endocardial border not optimally defined to evaluate regional wall motion. There is mild left ventricular  hypertrophy. Left ventricular diastolic parameters are consistent with Grade I diastolic dysfunction (impaired relaxation).  2. Right ventricular systolic function is normal. The right ventricular size is normal.  3. Left atrial size was severely dilated.  4. The mitral valve is grossly normal. Mild mitral valve regurgitation. No evidence of mitral stenosis.  5. The aortic valve is tricuspid. Aortic valve regurgitation is trivial. No aortic stenosis is present.  6. The inferior vena cava is normal in size with greater than 50% respiratory variability, suggesting right atrial pressure of 3 mmHg. Comparison(s): Prior images unable to be directly viewed, comparison made by report only. No significant change from prior study. FINDINGS  Left Ventricle: Left ventricular ejection fraction, by estimation, is 50 to 55%. The left ventricle has low normal function. Left ventricular endocardial border not optimally defined to evaluate regional wall motion. The left ventricular internal cavity  size was normal in size. There is mild left ventricular hypertrophy. Left ventricular diastolic parameters are consistent with Grade I diastolic dysfunction (impaired relaxation). Right Ventricle: The right ventricular size is normal. No increase in right ventricular wall thickness. Right ventricular systolic function is normal. Left Atrium: Left atrial size was severely dilated. Right Atrium: Right atrial size was normal in size. Pericardium: There is no evidence of pericardial effusion. Mitral Valve: The mitral valve is grossly normal. Mild mitral valve regurgitation. No evidence of mitral valve stenosis. MV peak gradient, 4.2 mmHg. The mean mitral valve gradient is 2.0 mmHg. Tricuspid Valve: The tricuspid valve is grossly normal. Tricuspid valve regurgitation is trivial. No evidence of tricuspid stenosis. Aortic Valve: The aortic valve is tricuspid. Aortic valve regurgitation is trivial. No aortic stenosis is present. Aortic valve mean  gradient measures 3.0 mmHg. Aortic valve peak gradient measures 5.6 mmHg. Aortic valve area, by VTI measures 2.27 cm. Pulmonic Valve: The pulmonic valve was grossly normal. Pulmonic valve regurgitation is trivial. No evidence of pulmonic stenosis. Aorta: The aortic root is normal in size and structure. Venous: The inferior vena cava is normal in size with greater than 50% respiratory variability, suggesting right atrial pressure of 3 mmHg. IAS/Shunts: No atrial level shunt detected by color flow Doppler.  LEFT VENTRICLE PLAX 2D LVIDd:         5.30 cm   Diastology LVIDs:         3.90 cm   LV e' medial:    6.31 cm/s LV PW:         1.30 cm   LV E/e' medial:  14.1 LV IVS:        1.30 cm   LV e' lateral:   6.09 cm/s LVOT diam:     2.00 cm   LV E/e' lateral: 14.6 LV SV:         70 LV SV Index:   51 LVOT Area:     3.14 cm  RIGHT VENTRICLE RV Basal diam:  2.75 cm RV Mid diam:    2.70 cm RV S prime:     12.30 cm/s TAPSE (M-mode): 2.5 cm LEFT ATRIUM             Index        RIGHT ATRIUM           Index LA diam:        4.70 cm 3.44 cm/m   RA Area:     11.90 cm LA Vol (A2C):   83.1 ml 60.81 ml/m  RA Volume:   27.50 ml  20.12 ml/m LA Vol (A4C):   80.9 ml 59.20 ml/m LA Biplane Vol: 83.0 ml 60.74 ml/m  AORTIC VALVE                    PULMONIC VALVE AV Area (  Vmax):    2.24 cm     PV Vmax:       0.75 m/s AV Area (Vmean):   2.08 cm     PV Peak grad:  2.2 mmHg AV Area (VTI):     2.27 cm AV Vmax:           118.00 cm/s AV Vmean:          87.200 cm/s AV VTI:            0.307 m AV Peak Grad:      5.6 mmHg AV Mean Grad:      3.0 mmHg LVOT Vmax:         84.20 cm/s LVOT Vmean:        57.700 cm/s LVOT VTI:          0.222 m LVOT/AV VTI ratio: 0.72  AORTA Ao Root diam: 3.20 cm MITRAL VALVE MV Area (PHT): 3.58 cm    SHUNTS MV Area VTI:   2.57 cm    Systemic VTI:  0.22 m MV Peak grad:  4.2 mmHg    Systemic Diam: 2.00 cm MV Mean grad:  2.0 mmHg MV Vmax:       1.03 m/s MV Vmean:      64.8 cm/s MV Decel Time: 212 msec MV E velocity:  88.70 cm/s MV A velocity: 89.10 cm/s MV E/A ratio:  1.00 Vishnu Priya Mallipeddi Electronically signed by Lorelee Cover Mallipeddi Signature Date/Time: 07/12/2022/11:59:38 AM    Final    CT ANGIO CHEST/ABD/PEL FOR DISSECTION W &/OR WO CONTRAST  Result Date: 07/11/2022 CLINICAL DATA:  Acute aortic syndrome suspected, abdominal pain, diarrhea EXAM: CT ANGIOGRAPHY CHEST, ABDOMEN AND PELVIS TECHNIQUE: Non-contrast CT of the chest was initially obtained. Multidetector CT imaging through the chest, abdomen and pelvis was performed using the standard protocol during bolus administration of intravenous contrast. Multiplanar reconstructed images and MIPs were obtained and reviewed to evaluate the vascular anatomy. RADIATION DOSE REDUCTION: This exam was performed according to the departmental dose-optimization program which includes automated exposure control, adjustment of the mA and/or kV according to patient size and/or use of iterative reconstruction technique. CONTRAST:  58mL OMNIPAQUE IOHEXOL 350 MG/ML SOLN, <See Chart> OMNIPAQUE IOHEXOL 300 MG/ML SOLN COMPARISON:  CT abdomen and pelvis done on done 823 FINDINGS: CTA CHEST FINDINGS Cardiovascular: There is no demonstrable mural hematoma in noncontrast images of thoracic aorta. Coronary artery calcifications are seen. Extensive calcifications are seen in thoracic aorta. There is homogeneous enhancement in thoracic aorta. Major branches of thoracic aorta appear patent. Mediastinum/Nodes: There are enlarged lymph nodes in mediastinum. There is inhomogeneous attenuation in thyroid with scattered calcifications. Lungs/Pleura: Moderate bilateral pleural effusions are seen with interval increase. There are patchy infiltrates in both lower lung fields suggesting atelectasis/pneumonia. There are faint ground-glass densities in parahilar regions. There is no pneumothorax. Musculoskeletal: No acute findings are seen. Review of the MIP images confirms the above findings. CTA  ABDOMEN AND PELVIS FINDINGS VASCULAR Aorta: There is no evidence of dissection or focal aneurysmal dilation. There are scattered atherosclerotic plaques and calcifications. Celiac: Atherosclerotic plaques and calcifications are seen in celiac axis and its major branches. SMA: There are calcifications and atherosclerotic plaques with moderate narrowing of proximal course. Renals: There are calcifications and atherosclerotic plaques. Both renal arteries appear smaller than usual, possibly related to cortical atrophy in both kidneys. IMA: There is narrowing of the origin. Distal course of IMA is patent. Iliacs: There are scattered atherosclerotic plaques and calcifications without signs of dissection  or focal aneurysmal dilation. Veins: Unremarkable. Review of the MIP images confirms the above findings. NON-VASCULAR Hepatobiliary: There is fatty infiltration in liver. Gallbladder is distended. There is no wall thickening in gallbladder. There is no dilation of bile ducts. Pancreas: There is 1.7 cm smooth marginated fluid density lesion in the body of pancreas. Similar finding was seen in previous studies dating as far back as MRI done on 12/09/2020. Spleen: Minimal ascites is noted adjacent to the spleen. Adrenals/Urinary Tract: Adrenals appear stable. There is atrophy in both kidneys suggesting medical renal disease. There is 12 mm fluid density structure in the upper pole of left kidney suggesting renal cyst. There are no renal or ureteral stones. Urinary bladder is not distended. Stomach/Bowel: Stomach is not distended. Small bowel loops are unremarkable. Appendix is not seen. There is no significant wall thickening in colon. Few diverticula are seen in colon without signs of focal diverticulitis. Lymphatic: There are slightly enlarged lymph nodes in retroperitoneum and mesentery. This may suggest reactive hyperplasia of lymph nodes. Reproductive: Unremarkable. Other: Small ascites is present.  There is no  pneumoperitoneum. Musculoskeletal: There is first-degree anterolisthesis at L4-L5 level. There is moderate spinal stenosis at L4-L5 level along with encroachment of neural foramina. Review of the MIP images confirms the above findings. IMPRESSION: There is no evidence of dissection in thoracic and abdominal aorta. Major branches of thoracic and abdominal aorta are patent. Diffuse atherosclerotic changes are noted in thoracic and abdominal aorta. Coronary artery calcifications are seen. There are patchy infiltrates in both lower lung fields suggesting multifocal atelectasis/pneumonia. There are ground-glass densities in the parahilar regions, possibly suggesting pulmonary edema. Moderate bilateral pleural effusions are seen, more so on the right side with interval increase. Small ascites is seen. There is no evidence of intestinal obstruction or pneumoperitoneum. There is no hydronephrosis. There is 1.7 cm fluid density lesion in pancreas. Follow-up multiphasic CT or MRI in 1 year may be considered to assess stability. Enlarged lymph nodes are seen in mediastinum and abdomen, possibly suggesting reactive hyperplasia. Fatty liver. Small ascites. Medical renal disease. Left renal cyst. 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 Few diverticula are seen in colon without signs of focal diverticulitis. Other findings as described in the body of the report. Electronically Signed   By: Elmer Picker M.D.   On: 07/11/2022 13:15      Subjective: Feels better after hemodialysis  Discharge Exam: Vitals:   07/12/22 1330 07/12/22 1400 07/12/22 1450 07/12/22 1500  BP: (!) 171/69 (!) 159/50 (!) 157/59   Pulse: 76 76 80   Resp: 16 18 18    Temp:   98 F (36.7 C)   TempSrc:   Oral   SpO2:   97%   Weight:    43.1 kg  Height:        General: Pt is alert, awake, not in acute distress Cardiovascular: RRR, S1/S2 +, no rubs, no gallops Respiratory: CTA  bilaterally, no wheezing, no rhonchi Abdominal: Soft, NT, ND, bowel sounds + Extremities: no edema, no cyanosis    The results of significant diagnostics from this hospitalization (including imaging, microbiology, ancillary and laboratory) are listed below for reference.     Microbiology: Recent Results (from the past 240 hour(s))  Resp panel by RT-PCR (RSV, Flu A&B, Covid) Anterior Nasal Swab     Status: None   Collection Time: 07/11/22 11:05 AM   Specimen: Anterior Nasal Swab  Result Value Ref Range Status   SARS Coronavirus  2 by RT PCR NEGATIVE NEGATIVE Final    Comment: (NOTE) SARS-CoV-2 target nucleic acids are NOT DETECTED.  The SARS-CoV-2 RNA is generally detectable in upper respiratory specimens during the acute phase of infection. The lowest concentration of SARS-CoV-2 viral copies this assay can detect is 138 copies/mL. A negative result does not preclude SARS-Cov-2 infection and should not be used as the sole basis for treatment or other patient management decisions. A negative result may occur with  improper specimen collection/handling, submission of specimen other than nasopharyngeal swab, presence of viral mutation(s) within the areas targeted by this assay, and inadequate number of viral copies(<138 copies/mL). A negative result must be combined with clinical observations, patient history, and epidemiological information. The expected result is Negative.  Fact Sheet for Patients:  EntrepreneurPulse.com.au  Fact Sheet for Healthcare Providers:  IncredibleEmployment.be  This test is no t yet approved or cleared by the [place] FDA and  has been authorized for detection and/or diagnosis of SARS-CoV-2 by FDA under an Emergency Use Authorization (EUA). This EUA will remain  in effect (meaning this test can be used) for the duration of the COVID-19 declaration under Section 564(b)(1) of the Act, 21 U.S.C.section  360bbb-3(b)(1), unless the authorization is terminated  or revoked sooner.       Influenza A by PCR NEGATIVE NEGATIVE Final   Influenza B by PCR NEGATIVE NEGATIVE Final    Comment: (NOTE) The Xpert Xpress SARS-CoV-2/FLU/RSV plus assay is intended as an aid in the diagnosis of influenza from Nasopharyngeal swab specimens and should not be used as a sole basis for treatment. Nasal washings and aspirates are unacceptable for Xpert Xpress SARS-CoV-2/FLU/RSV testing.  Fact Sheet for Patients: EntrepreneurPulse.com.au  Fact Sheet for Healthcare Providers: IncredibleEmployment.be  This test is not yet approved or cleared by the [place] FDA and has been authorized for detection and/or diagnosis of SARS-CoV-2 by FDA under an Emergency Use Authorization (EUA). This EUA will remain in effect (meaning this test can be used) for the duration of the COVID-19 declaration under Section 564(b)(1) of the Act, 21 U.S.C. section 360bbb-3(b)(1), unless the authorization is terminated or revoked.     Resp Syncytial Virus by PCR NEGATIVE NEGATIVE Final    Comment: (NOTE) Fact Sheet for Patients: EntrepreneurPulse.com.au  Fact Sheet for Healthcare Providers: IncredibleEmployment.be  This test is not yet approved or cleared by the [place] FDA and has been authorized for detection and/or diagnosis of SARS-CoV-2 by FDA under an Emergency Use Authorization (EUA). This EUA will remain in effect (meaning this test can be used) for the duration of the COVID-19 declaration under Section 564(b)(1) of the Act, 21 U.S.C. section 360bbb-3(b)(1), unless the authorization is terminated or revoked.  Performed at [facility], [address], [place], [place] [address]      Labs: BNP (last 3 results) Recent Labs    02/28/22 1237 04/22/22 0829  BNP 3,203.1* 7,062.3*   Basic Metabolic Panel: Recent Labs  Lab  07/11/22 1105 07/12/22 0126  NA 138 136  K 5.3* 4.0  CL 101 98  CO2 23 28  GLUCOSE 106* 97  BUN 68* 34*  CREATININE 7.47* 4.50*  CALCIUM 8.4* 7.9*  MG  --  1.6*  PHOS  --  4.8*   Liver Function Tests: Recent Labs  Lab 07/11/22 1105 07/12/22 0126  AST 24 21  ALT 16 13  ALKPHOS 68 58  BILITOT 1.0 1.0  PROT 6.8 5.7*  ALBUMIN 3.1* 2.6*   Recent Labs  Lab 07/11/22  1105  LIPASE 39   No results for input(s): "AMMONIA" in the last 168 hours. CBC: Recent Labs  Lab 07/11/22 1105 07/12/22 0126  WBC 13.2* 7.9  NEUTROABS 11.7* 6.4  HGB 9.9* 8.9*  HCT 30.5* 27.4*  MCV 92.7 93.8  PLT 255 189   Cardiac Enzymes: No results for input(s): "CKTOTAL", "CKMB", "CKMBINDEX", "TROPONINI" in the last 168 hours. BNP: Invalid input(s): "POCBNP" CBG: No results for input(s): "GLUCAP" in the last 168 hours. D-Dimer No results for input(s): "DDIMER" in the last 72 hours. Hgb A1c No results for input(s): "HGBA1C" in the last 72 hours. Lipid Profile No results for input(s): "CHOL", "HDL", "LDLCALC", "TRIG", "CHOLHDL", "LDLDIRECT" in the last 72 hours. Thyroid function studies No results for input(s): "TSH", "T4TOTAL", "T3FREE", "THYROIDAB" in the last 72 hours.  Invalid input(s): "FREET3" Anemia work up Recent Labs    07/12/22 0600  FERRITIN 373*  TIBC 148*  IRON 22*   Urinalysis    Component Value Date/Time   COLORURINE COLORLESS (A) 08/29/2021 0827   APPEARANCEUR CLEAR 08/29/2021 0827   LABSPEC 1.005 08/29/2021 0827   PHURINE 8.0 08/29/2021 0827   GLUCOSEU >=500 (A) 08/29/2021 0827   HGBUR NEGATIVE 08/29/2021 0827   BILIRUBINUR NEGATIVE 08/29/2021 0827   KETONESUR NEGATIVE 08/29/2021 0827   PROTEINUR 30 (A) 08/29/2021 0827   UROBILINOGEN 0.2 01/11/2014 1635   NITRITE NEGATIVE 08/29/2021 0827   LEUKOCYTESUR NEGATIVE 08/29/2021 0827   Sepsis Labs Recent Labs  Lab 07/11/22 1105 07/12/22 0126  WBC 13.2* 7.9   Microbiology Recent Results (from the past 240  hour(s))  Resp panel by RT-PCR (RSV, Flu A&B, Covid) Anterior Nasal Swab     Status: None   Collection Time: 07/11/22 11:05 AM   Specimen: Anterior Nasal Swab  Result Value Ref Range Status   SARS Coronavirus 2 by RT PCR NEGATIVE NEGATIVE Final    Comment: (NOTE) SARS-CoV-2 target nucleic acids are NOT DETECTED.  The SARS-CoV-2 RNA is generally detectable in upper respiratory specimens during the acute phase of infection. The lowest concentration of SARS-CoV-2 viral copies this assay can detect is 138 copies/mL. A negative result does not preclude SARS-Cov-2 infection and should not be used as the sole basis for treatment or other patient management decisions. A negative result may occur with  improper specimen collection/handling, submission of specimen other than nasopharyngeal swab, presence of viral mutation(s) within the areas targeted by this assay, and inadequate number of viral copies(<138 copies/mL). A negative result must be combined with clinical observations, patient history, and epidemiological information. The expected result is Negative.  Fact Sheet for Patients:  EntrepreneurPulse.com.au  Fact Sheet for Healthcare Providers:  IncredibleEmployment.be  This test is no t yet approved or cleared by the [place] FDA and  has been authorized for detection and/or diagnosis of SARS-CoV-2 by FDA under an Emergency Use Authorization (EUA). This EUA will remain  in effect (meaning this test can be used) for the duration of the COVID-19 declaration under Section 564(b)(1) of the Act, 21 U.S.C.section 360bbb-3(b)(1), unless the authorization is terminated  or revoked sooner.       Influenza A by PCR NEGATIVE NEGATIVE Final   Influenza B by PCR NEGATIVE NEGATIVE Final    Comment: (NOTE) The Xpert Xpress SARS-CoV-2/FLU/RSV plus assay is intended as an aid in the diagnosis of influenza from Nasopharyngeal swab specimens and should not  be used as a sole basis for treatment. Nasal washings and aspirates are unacceptable for Xpert Xpress SARS-CoV-2/FLU/RSV testing.  Fact Sheet  for Patients: EntrepreneurPulse.com.au  Fact Sheet for Healthcare Providers: IncredibleEmployment.be  This test is not yet approved or cleared by the [place] FDA and has been authorized for detection and/or diagnosis of SARS-CoV-2 by FDA under an Emergency Use Authorization (EUA). This EUA will remain in effect (meaning this test can be used) for the duration of the COVID-19 declaration under Section 564(b)(1) of the Act, 21 U.S.C. section 360bbb-3(b)(1), unless the authorization is terminated or revoked.     Resp Syncytial Virus by PCR NEGATIVE NEGATIVE Final    Comment: (NOTE) Fact Sheet for Patients: EntrepreneurPulse.com.au  Fact Sheet for Healthcare Providers: IncredibleEmployment.be  This test is not yet approved or cleared by the [place] FDA and has been authorized for detection and/or diagnosis of SARS-CoV-2 by FDA under an Emergency Use Authorization (EUA). This EUA will remain in effect (meaning this test can be used) for the duration of the COVID-19 declaration under Section 564(b)(1) of the Act, 21 U.S.C. section 360bbb-3(b)(1), unless the authorization is terminated or revoked.  Performed at [facility], [address] [place], [place] [address]      Time coordinating discharge: 85mins  SIGNED:   Kathie Dike, MD  [facility] 07/12/2022, 9:24 PM   If 7PM-7AM, please contact night-coverage [email or web address]

## 2022-07-12 NOTE — Progress Notes (Addendum)
Pt underwent HD last night -  ran 3 hours and had 1.7 liters removed-  feels much better " I feel fine today "  no more abdominal sxms-  O2 sat 96 % on 2 liters. She is wondering whether she will need O2 at home- "this keeps happening"  had hosps in August and October both of which included hypoxia.  She is a small woman so large amounts of UF at a time are not that achievable.    She tells me that she is going to be running next at [facility] on Saturday due to holiday schedule-  for 3 hours, then next on Tuesday -  I confirmed that with [name]  My plan would be to UF today for 2.5 hours, attempt some more fluid removal but I agree with the patient -  given 3 hosps in 4 mos it would be a good idea for her to have O2 at home for PRN use.    If this were done- she could easily be discharged after her UF treatment today to resume HD at [facility] on Saturday   Also her hgb is in the 8's-  not helping her SOB-  will add on iron stores to labs and give iron if low-  will also give dose ESA today   Louis Meckel

## 2022-07-12 NOTE — Progress Notes (Signed)
Ng Discharge Note  Admit Date:  07/11/2022 Discharge date: 07/12/2022   Felicia Williams to be D/C'd Home per MD order.  AVS completed. Patient/caregiver able to verbalize understanding.  Discharge Medication: Allergies as of 07/12/2022       Reactions   Cefepime Other (See Comments)   Feb 2023 Encephalopathy with questionable seizures. Seems to be tolerating cefazolin    Morphine Other (See Comments)   "Dry heaving like crazy"   Sulfa Antibiotics Other (See Comments)   Shut pt's kidneys down        Medication List     STOP taking these medications    doxycycline 100 MG tablet Commonly known as: VIBRA-TABS   ondansetron 4 MG disintegrating tablet Commonly known as: ZOFRAN-ODT       TAKE these medications    acetaminophen 325 MG tablet Commonly known as: TYLENOL Take 2 tablets (650 mg total) by mouth every 6 (six) hours as needed for mild pain. What changed: how much to take   camphor-menthol lotion Commonly known as: SARNA Apply topically as needed for itching. What changed: how much to take   carvedilol 12.5 MG tablet Commonly known as: COREG Take 1 tablet by mouth 2 (two) times daily.   famotidine 40 MG tablet Commonly known as: Pepcid Take 1 tablet (40 mg total) by mouth at bedtime. What changed:  when to take this reasons to take this   hydrALAZINE 50 MG tablet Commonly known as: APRESOLINE Take 50 mg by mouth 2 (two) times daily. Do not take morning dose on Dialysis days. Monday,Wednesday and Friday   loperamide 2 MG capsule Commonly known as: IMODIUM Take 1 capsule (2 mg total) by mouth as needed for diarrhea or loose stools.   pantoprazole 40 MG tablet Commonly known as: PROTONIX Take 40 mg by mouth daily. What changed: Another medication with the same name was removed. Continue taking this medication, and follow the directions you see here.   traZODone 100 MG tablet Commonly known as: DESYREL Take 100 mg by mouth at bedtime.                Durable Medical Equipment  (From admission, onward)           Start     Ordered   07/12/22 1226  For home use only DME oxygen  Once       Question Answer Comment  Length of Need 6 Months   Mode or (Route) Nasal cannula   Liters per Minute 2   Frequency Continuous (stationary and portable oxygen unit needed)   Oxygen conserving device Yes   Oxygen delivery system Gas      07/12/22 1225            Discharge Assessment: Vitals:   07/12/22 1400 07/12/22 1450  BP: (!) 159/50 (!) 157/59  Pulse: 76 80  Resp: 18 18  Temp:  98 F (36.7 C)  SpO2:  97%   Skin clean, dry and intact without evidence of skin break down, no evidence of skin tears noted. IV catheter discontinued intact. Site without signs and symptoms of complications - no redness or edema noted at insertion site, patient denies c/o pain - only slight tenderness at site.  Dressing with slight pressure applied.  D/c Instructions-Education: Discharge instructions given to patient/family with verbalized understanding. D/c education completed with patient/family including follow up instructions, medication list, d/c activities limitations if indicated, with other d/c instructions as indicated by MD - patient able to verbalize understanding, all  questions fully answered. Patient instructed to return to ED, call 911, or call MD for any changes in condition.  Patient escorted via [place], and D/C home via private auto.  Richrd Prime, LPN 97/18/2099 0:68 PM

## 2022-07-12 NOTE — TOC Transition Note (Signed)
Transition of Care [facility]) - CM/SW Discharge Note   Patient Details  Name: Shenique Childers MRN: [id number] Date of Birth: 05/11/1941  [facility] CM/SW Contact:  Iona Beard, [place] Phone Number: 07/12/2022, 12:15 PM   Clinical Narrative:    TOC updated that pt will likely need home O2 prior to D/C. CSW spoke to pt in HD room about DME agency preference. Pt states that she does not have a preference. CSW spoke to [place] with [facility] who states they can accept referral. Tank to be delivered to pts room prior to D/C. TOC signing off.   Final next level of care: Home/Self Care Barriers to Discharge: Barriers Resolved   Patient Goals and CMS Choice Patient states their goals for this hospitalization and ongoing recovery are:: return home CMS Medicare.gov Compare Post Acute Care list provided to:: Patient Choice offered to / list presented to : Patient    Discharge Placement                       Discharge Plan and Services                DME Arranged: Oxygen DME Agency: [facility] Date DME Agency Contacted: 07/12/22   Representative spoke with at DME Agency: Caryl Pina            Social Determinants of Health ([place]) Interventions     Readmission Risk Interventions     No data to display

## 2022-07-12 NOTE — Assessment & Plan Note (Signed)
-   Likely related to pulmonary edema and pleural effusions - Focal atelectasis versus pneumonia on CT, but procalcitonin is not indicative of bacterial infection at 0.28 - COVID and flu negative - Continue albuterol as needed - Wean off O2 as tolerated - Likely improved with dialysis session tomorrow - Also control blood pressure so that hypertensive crisis was not causing more pulmonary edema - Continue to monitor

## 2022-07-12 NOTE — Progress Notes (Signed)
Patient admitted this shift. Informed that patient did not complete dialysis due to shortness of breath, per ED nurse. Patient is alert and oriented. Troponin 234 at 01:26, Dr. Clearence Ped made aware. She slept through the night no complaints of pain. Continued to observe.

## 2022-07-12 NOTE — Assessment & Plan Note (Signed)
-   Normal dialysis Monday Wednesday Friday - Last full session dialysis Monday - Shorter session of dialysis Tuesday during her ER visit, 1.7 L removed - Tuesday dialysis session cut short due to cramping - Nephro requested admission to hospitalist team with plan for nephro to see her in the a.m. - CT chest shows pulmonary edema and bilateral pleural effusions are likely causing her hypoxia - Likely will have further dialysis tomorrow for fluid removal

## 2022-07-12 NOTE — Progress Notes (Signed)
*  PRELIMINARY RESULTS* Echocardiogram 2D Echocardiogram has been performed.  Felicia Williams 07/12/2022, 9:18 AM

## 2022-07-12 NOTE — Progress Notes (Signed)
   07/12/22 0205  Provider Notification  Provider Name/Title Dr. Clearence Ped  Date Provider Notified 07/12/22  Time Provider Notified 0205  Method of Notification  (Secure Chat)  Notification Reason Critical Result  Test performed and critical result Troponin 234  Date Critical Result Received 07/12/22  Time Critical Result Received 0206   Critical Lab Troponin 234, Dr. Clearence Ped notified.

## 2022-07-12 NOTE — Assessment & Plan Note (Signed)
-   K+ initially 5.3 -HD done today with 1.7 L off.  -Repeat BMP in the AM -Monitor on tele -Defer to nephro

## 2022-07-12 NOTE — Assessment & Plan Note (Signed)
-   Continue Pepcid and Protonix 

## 2022-07-12 NOTE — Assessment & Plan Note (Signed)
-   Blood pressure range in the ED 170/59-219/154 - Pulmonary edema on CTA - Leukocytosis at 13.2 - Continue Coreg - Continue as needed hydralazine IV - Add on troponin - Continue to monitor

## 2022-07-12 NOTE — Progress Notes (Signed)
SATURATION QUALIFICATIONS:  Patient Saturations on Room Air at Rest = 91%  Patient Saturations on Room Air while Ambulating = 85%  Patient Saturations on 1 Liters of oxygen while Ambulating = 94%  Please briefly explain why patient needs home oxygen: Pt. Desats with exertion.

## 2022-07-12 NOTE — Progress Notes (Signed)
Pt. down to 85% on RA while ambulating. Pt. Started to c/o SOB, put pt. on 1L [place], pt. Now satting at 93-94% while ambulating. MD notified.

## 2022-07-12 NOTE — Procedures (Signed)
  HEMODIALYSIS TREATMENT NOTE:  Treatment was delayed due to inability to bring pt to [facility] due to her unknown HBV status; labs are still pending.    Saturating 86% on room air pre-HD.  Denies dyspnea.  Ultrafiltration initiated with 2.5 L goal over 2.5 hours.  Two hours into treatment, rising venous pressures from clotting cartridge compelled blood return.  Net UF 2 liters at this point.  Situation was discussed with Dr. Moshe Cipro who authorizes ending treatment at this point, so as to not administer saline unnecessarily.  3.7 liters total has been removed in the last 24 hours.  Pt continues to desaturate on room air - to 87% - despite volume removed.  Standing post HD v/s:  144/66,  HR 80,  R 20,  weight 43.1 kg (EDW 44 kg) - communicated to Hulen Shouts, RN at [facility], along with Dr. Shelva Majestic recommendation for continued EDW challenge.  Hand-off given to Sharrell Ku, LPN  Rockwell Alexandria, RN

## 2022-07-26 ENCOUNTER — Ambulatory Visit: Payer: Medicare Other | Attending: Internal Medicine | Admitting: Internal Medicine

## 2022-07-26 ENCOUNTER — Encounter: Payer: Self-pay | Admitting: Internal Medicine

## 2022-07-26 ENCOUNTER — Encounter: Payer: Self-pay | Admitting: *Deleted

## 2022-07-26 VITALS — BP 140/70 | HR 76 | Ht 59.0 in | Wt 98.0 lb

## 2022-07-26 DIAGNOSIS — R0609 Other forms of dyspnea: Secondary | ICD-10-CM

## 2022-07-26 NOTE — Patient Instructions (Addendum)
Medication Instructions:  Your physician recommends that you continue on your current medications as directed. Please refer to the Current Medication list given to you today.   Labwork: none  Testing/Procedures: Your physician has requested that you have a lexiscan myoview. For further information please visit [email or web address]. Please follow instruction sheet, as given.    Follow-Up: Your physician recommends that you schedule a follow-up appointment in: as needed    Any Other Special Instructions Will Be Listed Below (If Applicable).     If you need a refill on your cardiac medications before your next appointment, please call your pharmacy.   

## 2022-07-26 NOTE — Progress Notes (Signed)
Cardiology Office Note  Date: 07/26/2022   ID: Myelle Poteat, DOB 01-Nov-1940, MRN [id number]  PCP:  [name], NP  Cardiologist:  None Electrophysiologist:  None   Reason for Office Visit:    History of Present Illness: Felicia Williams is a 82 y.o. female known to have ESRD DD MWF, HTN was referred to cardiology clinic for evaluation of DOE.  Patient was initially referred to cardiology clinic in 2017 for palpitations, DOE and intermittent leg swelling for which nuclear stress test was performed and showed no evidence of ischemia. Echo showed normal LVEF and patient was discharged from the cardiology clinic. She is referred back to cardiology clinic for similar symptoms of DOE.  Echo was performed on 07/12/2022 showed LVEF 50 to 69%, grade 1 diastolic dysfunction and mild MR patient reported having DOE for couple of years and is unsure if it is related to dialysis or something else.  She is unable to perform any of her daily activities at home as she feels worn out after doing any activity. She takes rest multiple times throughout the day after exertion (like daily activities, cooking, cleaning, vacuuming the house). Denied any angina, dizziness, lightness, syncope, leg swelling.  She had multiple admissions in the last 1 year for volume overload requiring urgent hemodialysis session. She does not have an outpatient nephrologist she sees [place] but sees Dr. Theador Hawthorne in the dialysis clinic.  They had difficulty removing fluid in her dialysis sessions due to hypotension and is wondering if they can spread out the dialysis days from 3 days/week to 4 days/week.  Denies smoking cigarettes, illicit drug abuse and alcohol use.  She does have family history of ASCVD (her brothers have PCI/CABG).  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 Laural Golden.   GERD (gastroesophageal reflux disease)    Glomerulonephritis    Gout    Mixed hyperlipidemia    Spondylosis     Past Surgical History:  Procedure Laterality Date   APPENDECTOMY     AV FISTULA PLACEMENT Left 03/20/2022   Procedure: INSERTION OF LEFT UPPER ARM ARTERIOVENOUS (AV) GORE-TEX GRAFT;  Surgeon: Rosetta Posner, MD;  Location: [facility];  Service: Vascular;  Laterality: Left;   CATARACT EXTRACTION     COLONOSCOPY N/A 12/28/2015   Procedure: COLONOSCOPY;  Surgeon: Rogene Houston, MD;  Location: [facility];  Service: Endoscopy;  Laterality: N/A;  815   ESOPHAGOGASTRODUODENOSCOPY N/A 11/16/2020   Procedure: ESOPHAGOGASTRODUODENOSCOPY (EGD);  Surgeon: Rogene Houston, MD;  Location: [facility];  Service: Endoscopy;  Laterality: N/A;  1:15   EXCHANGE OF A DIALYSIS CATHETER N/A 03/20/2022   Procedure: EXCHANGE OF A DIALYSIS CATHETER;  Surgeon: Rosetta Posner, MD;  Location: [facility];  Service: Vascular;  Laterality: N/A;   INSERTION OF DIALYSIS CATHETER Right 09/30/2021   Procedure: INSERTION OF TUNNELED DIALYSIS CATHETER;  Surgeon: Waynetta Sandy, MD;  Location: [place];  Service: Vascular;  Laterality: Right;   IR FLUORO GUIDE CV LINE RIGHT  01/16/2019   IR REMOVAL TUN CV CATH W/O FL  07/01/2019   IR US GUIDE VASC ACCESS RIGHT  01/16/2019  POLYPECTOMY  11/16/2020   Procedure: POLYPECTOMY;  Surgeon: Rogene Houston, MD;  [facility];  Service: Endoscopy;;  gastric   REMOVAL OF A DIALYSIS CATHETER  09/30/2021   Procedure: REMOVAL OF A PERITONEAL DIALYSIS CATHETER;  Surgeon: Waynetta Sandy, MD;  Location: [place];  Service: Vascular;;   REMOVAL OF A DIALYSIS CATHETER N/A 05/22/2022   Procedure: MINOR REMOVAL OF A TUNNELED DIALYSIS CATHETER;  Surgeon: Rosetta Posner, MD;  Location: [facility];  Service: Vascular;   Laterality: N/A;   TUBAL LIGATION      Current Outpatient Medications  Medication Sig Dispense Refill   acetaminophen (TYLENOL) 325 MG tablet Take 2 tablets (650 mg total) by mouth every 6 (six) hours as needed for mild pain. (Patient taking differently: Take 325 mg by mouth every 6 (six) hours as needed for mild pain.)     camphor-menthol (SARNA) lotion Apply topically as needed for itching. (Patient taking differently: Apply 1 Application topically as needed for itching.) 222 mL 0   carvedilol (COREG) 12.5 MG tablet Take 1 tablet by mouth 2 (two) times daily.     famotidine (PEPCID) 40 MG tablet Take 1 tablet (40 mg total) by mouth at bedtime. (Patient taking differently: Take 40 mg by mouth at bedtime as needed for heartburn.) 30 tablet 1   hydrALAZINE (APRESOLINE) 50 MG tablet Take 50 mg by mouth 2 (two) times daily. Do not take morning dose on Dialysis days. Monday,Wednesday and Friday     loperamide (IMODIUM) 2 MG capsule Take 1 capsule (2 mg total) by mouth as needed for diarrhea or loose stools. 30 capsule 0   pantoprazole (PROTONIX) 40 MG tablet Take 40 mg by mouth daily.     traZODone (DESYREL) 100 MG tablet Take 100 mg by mouth at bedtime.     No current facility-administered medications for this visit.   Allergies:  Cefepime, Morphine, and Sulfa antibiotics   Social History: The patient  reports that she has never smoked. She has never been exposed to tobacco smoke. She has never used smokeless tobacco. She reports that she does not drink alcohol and does not use drugs.   Family History: The patient's family history includes CAD in her father; Diabetes Mellitus II in her father; Heart attack in her father; Hypertension in her father; Lupus in her brother.   ROS:  Please see the history of present illness. Otherwise, complete review of systems is positive for none.  All other systems are reviewed and negative.   Physical Exam: VS:  There were no vitals taken for this visit., BMI  There is no height or weight on file to calculate BMI.  Wt Readings from Last 3 Encounters:  07/12/22 95 lb 0.3 oz (43.1 kg)  05/02/22 100 lb 3.2 oz (45.5 kg)  04/29/22 103 lb (46.7 kg)    General: Patient appears comfortable at rest. HEENT: Conjunctiva and lids normal, oropharynx clear with moist mucosa. Neck: Supple, no elevated JVP or carotid bruits, no thyromegaly. Lungs: Clear to auscultation, nonlabored breathing at rest. Cardiac: Regular rate and rhythm, no S3 or significant systolic murmur, no pericardial rub. Abdomen: Soft, nontender, no hepatomegaly, bowel sounds present, no guarding or rebound. Extremities: No pitting edema Skin: Warm and dry. Musculoskeletal: No kyphosis. Neuropsychiatric: Alert and oriented x3, affect grossly appropriate.  ECG:  NSR  Recent Labwork: 08/31/2021: TSH 0.865 04/22/2022: B Natriuretic Peptide 4,375.0 07/12/2022: ALT 13; AST 21; BUN 34; Creatinine, Ser 4.50; Hemoglobin 8.9; Magnesium 1.6; Platelets 189; Potassium  4.0; Sodium 136  No results found for: "CHOL", "TRIG", "HDL", "CHOLHDL", "VLDL", "LDLCALC", "LDLDIRECT"  Other Studies Reviewed Today: Echo from 06/2022 LVEF 50 to 11% Grade 1 diastolic dysfunction Mild MR  Assessment and Plan: Patient is 82 year old F known to have ESRD DD, HTN was referred to cardiology clinic for evaluation of DOE.  # DOE -Patient has been having DOE for the last couple of years. Obtain Lexiscan. -Patient is wondering if her dialysis sessions should be switched from 3 days/week to 4 days/week to remove less fluid over more number of days and avoid hypotension.  Suggested her to talk to her nephrologist in the dialysis session.  # HTN, controlled -Continue carvedilol 12.5 mg twice daily -Continue hydralazine 50 mg twice daily -Management of HTN per PCP  I have spent a total of 41 minutes with patient reviewing chart, EKGs, labs and examining patient as well as establishing an assessment and plan that was  discussed with the patient.  > 50% of time was spent in direct patient care.      Medication Adjustments/Labs and Tests Ordered: Current medicines are reviewed at length with the patient today.  Concerns regarding medicines are outlined above.   Tests Ordered: No orders of the defined types were placed in this encounter.   Medication Changes: No orders of the defined types were placed in this encounter.   Disposition:  Follow up prn  Signed Callahan Peddie Fidel Levy, MD, 07/26/2022 8:16 AM    [facility] [place], [place], [place] [address]

## 2022-08-02 ENCOUNTER — Ambulatory Visit (HOSPITAL_COMMUNITY)
Admission: RE | Admit: 2022-08-02 | Discharge: 2022-08-02 | Disposition: A | Discharge status: Home / Self Care | Payer: Medicare Other | Source: Ambulatory Visit | Attending: Internal Medicine | Admitting: Internal Medicine

## 2022-08-02 DIAGNOSIS — R0609 Other forms of dyspnea: Secondary | ICD-10-CM | POA: Diagnosis present

## 2022-08-02 LAB — NM MYOCAR MULTI W/SPECT W/WALL MOTION / EF
LV dias vol: 108 mL (ref 46–106)
LV sys vol: 55 mL
Nuc Stress EF: 49 %
Peak HR: 89 {beats}/min
RATE: 0.5
Rest HR: 71 {beats}/min
Rest Nuclear Isotope Dose: 10.6 mCi
SDS: 0
SRS: 0
SSS: 0
ST Depression (mm): 0 mm
Stress Nuclear Isotope Dose: 32 mCi
TID: 0.86

## 2022-08-02 MED ORDER — TECHNETIUM TC 99M TETROFOSMIN IV KIT
30.0000 | PACK | Freq: Once | INTRAVENOUS | Status: AC | PRN
  Administered 2022-08-02: 32 via INTRAVENOUS

## 2022-08-02 MED ORDER — REGADENOSON 0.4 MG/5ML IV SOLN
INTRAVENOUS | Status: AC
  Administered 2022-08-02: 0.4 mg
  Filled 2022-08-02: qty 5

## 2022-08-02 MED ORDER — SODIUM CHLORIDE FLUSH 0.9 % IV SOLN
INTRAVENOUS | Status: AC
  Filled 2022-08-02: qty 10

## 2022-08-02 MED ORDER — TECHNETIUM TC 99M TETROFOSMIN IV KIT
10.0000 | PACK | Freq: Once | INTRAVENOUS | Status: AC | PRN
  Administered 2022-08-02: 10.6 via INTRAVENOUS

## 2022-08-09 ENCOUNTER — Encounter (INDEPENDENT_AMBULATORY_CARE_PROVIDER_SITE_OTHER): Payer: Self-pay | Admitting: Gastroenterology

## 2022-08-09 ENCOUNTER — Ambulatory Visit (INDEPENDENT_AMBULATORY_CARE_PROVIDER_SITE_OTHER): Payer: Medicare Other | Admitting: Gastroenterology

## 2022-08-09 VITALS — BP 195/84 | HR 76 | Temp 97.9°F | Ht 59.0 in | Wt 97.3 lb

## 2022-08-09 DIAGNOSIS — K529 Noninfective gastroenteritis and colitis, unspecified: Secondary | ICD-10-CM | POA: Insufficient documentation

## 2022-08-09 DIAGNOSIS — R197 Diarrhea, unspecified: Secondary | ICD-10-CM | POA: Insufficient documentation

## 2022-08-09 DIAGNOSIS — R198 Other specified symptoms and signs involving the digestive system and abdomen: Secondary | ICD-10-CM | POA: Diagnosis not present

## 2022-08-09 NOTE — Progress Notes (Signed)
Maylon Peppers, M.D. Gastroenterology & Hepatology [facility] [address] [place], [place] [address]  Primary Care Physician: [name], NP [address] [place] [address]  I will communicate my assessment and recommendations to the referring MD via EMR.  Problems: Diabetic gastroparesis GERD Chronic diarrhea Side branch IPMN  History of Present Illness: Felicia Williams is a 82 y.o. 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  The patient was last seen on 11/28/2021 (patient of Dr. Laural Golden). At that time, the patient was advised to take famotidine for reflux and gastroparesis symptoms.  Patient reports that she has had diarrhea for at least two years. She reports that for the last year she has been presenting more diarrhea than usual. She was recently having up to 5 watery bowel movements every day, without blood or melena. She was hospitalized on 07/11/22  for ARF due fluid overload but she was presenting diarrhea at that time as well. Takes Imodium several times a week as she has frequent diarrhea. . States she mentioned this diarrhea in last appointment but I do not see any documentation of this.  She reports having a chronic history of abdominal pain in her upper abdomen , possibly due to gastroparesis.  Had MRCP ordered in 12/14/21 - had a 17 mm cystic lesion, consistent with IPMN . Recommended repeat in 1 year.  The patient denies having any nausea, vomiting, fever, chills, hematochezia, melena, hematemesis, abdominal distention, jaundice, pruritus. She has lost 30-35 lb for the last year.  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 Laural Golden.   GERD (gastroesophageal reflux disease)    Glomerulonephritis    Gout    Mixed hyperlipidemia    Spondylosis     Past Surgical History: Past Surgical History:  Procedure Laterality Date   APPENDECTOMY     AV FISTULA PLACEMENT Left 03/20/2022   Procedure: INSERTION OF LEFT UPPER ARM ARTERIOVENOUS (AV) GORE-TEX GRAFT;  Surgeon: Rosetta Posner, MD;  [facility]: [facility];  Service: Vascular;  Laterality: Left;   CATARACT EXTRACTION     COLONOSCOPY N/A 12/28/2015   Procedure: COLONOSCOPY;  Surgeon: Rogene Houston, MD;  [facility];  Service: Endoscopy;  Laterality: N/A;  815   ESOPHAGOGASTRODUODENOSCOPY N/A 11/16/2020   Procedure: ESOPHAGOGASTRODUODENOSCOPY (EGD);  Surgeon: Rogene Houston, MD;  [facility];  Service: Endoscopy;  Laterality: N/A;  1:15   EXCHANGE OF A DIALYSIS CATHETER N/A 03/20/2022   Procedure: EXCHANGE OF A DIALYSIS CATHETER;  Surgeon: Rosetta Posner, MD;  [facility]: [facility];  Service: Vascular;  Laterality: N/A;   INSERTION OF DIALYSIS CATHETER Right 09/30/2021   Procedure: INSERTION OF TUNNELED DIALYSIS CATHETER;  Surgeon: Waynetta Sandy, MD;  Location: [facility];  Service: Vascular;  Laterality: Right;   IR FLUORO GUIDE  CV LINE RIGHT  01/16/2019   IR REMOVAL TUN CV CATH W/O FL  07/01/2019   IR US GUIDE VASC ACCESS RIGHT  01/16/2019   POLYPECTOMY  11/16/2020   Procedure: POLYPECTOMY;  Surgeon: Rogene Houston, MD;  [facility];  Service: Endoscopy;;  gastric   REMOVAL OF A DIALYSIS CATHETER  09/30/2021   Procedure: REMOVAL OF A PERITONEAL DIALYSIS CATHETER;  Surgeon: Waynetta Sandy, MD;  Location: [facility];  Service: Vascular;;   REMOVAL OF A DIALYSIS CATHETER N/A 05/22/2022   Procedure: MINOR REMOVAL OF A TUNNELED DIALYSIS CATHETER;  Surgeon: Rosetta Posner, MD;  [facility];  Service: Vascular;  Laterality: N/A;   TUBAL LIGATION      Family History: Family History  Problem Relation Age of Onset   CAD Father    Heart attack Father    Diabetes Mellitus II Father    Hypertension Father    Lupus Brother     Social History: Social History   Tobacco Use  Smoking Status Never   Passive exposure: Never  Smokeless Tobacco Never   Social History   Substance and Sexual Activity  Alcohol Use No   Social History   Substance and Sexual Activity  Drug Use No    Allergies: Allergies  Allergen Reactions   Cefepime Other (See Comments)    Feb 2023 Encephalopathy with questionable seizures. Seems to be tolerating cefazolin    Morphine Other (See Comments)    "Dry heaving like crazy"   Sulfa Antibiotics Other (See Comments)    Shut pt's kidneys down    Medications: Current Outpatient Medications  Medication Sig Dispense Refill   acetaminophen (TYLENOL) 325 MG tablet Take 2 tablets (650 mg total) by mouth every 6 (six) hours as needed for mild pain. (Patient taking differently: Take 325 mg by mouth every 6 (six) hours as needed for mild pain.)     camphor-menthol (SARNA) lotion Apply topically as needed for itching. (Patient taking differently: Apply 1 Application topically as needed for itching.) 222 mL 0   carvedilol (COREG) 12.5 MG tablet Take 1 tablet by mouth 2 (two) times daily.     famotidine (PEPCID) 40 MG tablet Take 1 tablet (40 mg total) by mouth at bedtime. (Patient taking differently: Take 40 mg by mouth at bedtime as needed for heartburn.) 30 tablet 1   hydrALAZINE (APRESOLINE) 50 MG tablet Take 50 mg by mouth 2 (two) times daily. Do not take morning dose on Dialysis days. Monday,Wednesday and Friday     lanthanum (FOSRENOL) 750 MG chewable tablet Chew 750 mg by  mouth 3 (three) times daily with meals.     loperamide (IMODIUM) 2 MG capsule Take 1 capsule (2 mg total) by mouth as needed for diarrhea or loose stools. 30 capsule 0   multivitamin (RENA-VIT) TABS tablet Take 1 tablet by mouth daily. One daily     traZODone (DESYREL) 100 MG tablet Take 100 mg by mouth at bedtime.     No current facility-administered medications for this visit.    Review of Systems: GENERAL: negative for malaise, night sweats HEENT: No changes in hearing or vision, no nose bleeds or other nasal problems. NECK: Negative for lumps, goiter, pain and significant neck swelling RESPIRATORY: Negative for cough, wheezing CARDIOVASCULAR: Negative for chest pain, leg swelling, palpitations, orthopnea GI: SEE HPI MUSCULOSKELETAL: Negative for joint pain or swelling, back pain, and muscle pain. SKIN: Negative for lesions, rash PSYCH: Negative for sleep disturbance, mood disorder and  recent psychosocial stressors. HEMATOLOGY Negative for prolonged bleeding, bruising easily, and swollen nodes. ENDOCRINE: Negative for cold or heat intolerance, polyuria, polydipsia and goiter. NEURO: negative for tremor, gait imbalance, syncope and seizures. The remainder of the review of systems is noncontributory.   Physical Exam: BP (!) 195/84 (BP Location: Right Arm, Patient Position: Sitting, Cuff Size: Normal) Comment: recheck 195/78  Pulse 76   Temp 97.9 F (36.6 C) (Oral)   Ht 4\' 11"  (1.499 m)   Wt 97 lb 4.8 oz (44.1 kg)   BMI 19.65 kg/m  GENERAL: The patient is AO x3, in no acute distress. HEENT: Head is normocephalic and atraumatic. EOMI are intact. Mouth is well hydrated and without lesions. NECK: Supple. No masses LUNGS: Clear to auscultation. No presence of rhonchi/wheezing/rales. Adequate chest expansion HEART: RRR, normal s1 and s2. ABDOMEN: Soft, nontender, no guarding, no peritoneal signs, and nondistended. BS +. No masses. EXTREMITIES: Without any cyanosis, clubbing, rash,  lesions or edema. NEUROLOGIC: AOx3, no focal motor deficit. SKIN: no jaundice, no rashes  Imaging/Labs: as above  I personally reviewed and interpreted the available labs, imaging and endoscopic files.  Impression and Plan: Felicia Williams is a 82 y.o. 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 Patient has presented worsening diarrhea, etiology unclear. It is possible this is aggravated by her diabetes as this disease has led to gastroparesis.  Will check for reversible causes with serologies and stool testing but if this workup is negative, will need to proceed with colonoscopy.  For now, can continue taking Imodium as needed to improve her symptoms.  Finally, she presented an relative small increase in the size of her IPMN on most recent CT scan.  Will need to repeat an MRI with and without IV contrast in January 2025 for cyst surveillance.  - Check celiac disease panel, CBC, CMP, GI path panel, C. Diff and pancreatic elastase/fecal fat -If negative workup may consider discussing colonoscopy in follow up appointment -Can take Imodium up to 4 times a day if presenting more than 4 bowel movements per day -Repeat MRI w and without Iv contrast in 07/2023  All questions were answered.      Maylon Peppers, MD Gastroenterology and Hepatology [facility]

## 2022-08-09 NOTE — Patient Instructions (Addendum)
Perform blood and stool workup If negative workup may consider discussing colonoscopy in follow up appointment Can take Imodium up to 4 times a day if presenting more than 4 bowel movements per day Repeat MRI w and without Iv contrast in 07/2023

## 2022-08-14 ENCOUNTER — Ambulatory Visit: Payer: Medicare Other | Admitting: Internal Medicine

## 2022-08-16 ENCOUNTER — Encounter: Payer: Self-pay | Admitting: Internal Medicine

## 2022-08-16 ENCOUNTER — Ambulatory Visit: Payer: Medicare Other | Attending: Internal Medicine | Admitting: Internal Medicine

## 2022-08-16 VITALS — BP 152/60 | HR 72 | Ht 59.0 in | Wt 98.6 lb

## 2022-08-16 DIAGNOSIS — R9439 Abnormal result of other cardiovascular function study: Secondary | ICD-10-CM | POA: Diagnosis not present

## 2022-08-16 NOTE — Progress Notes (Signed)
Cardiology Office Note  Date: 08/16/2022   ID: [name], DOB Sep 23, 1940, MRN [id number]  PCP:  [name], NP  Cardiologist:  Chalmers Guest, MD Electrophysiologist:  None   Reason for Office Visit: Follow-up of abnormal stress test   History of Present Illness: Felicia Williams is a 82 y.o. female known to have ESRD DD MWF, HTN presented to cardiology clinic for follow-up of abnormal stress test.  Patient was initially referred to cardiology clinic in 2017 for palpitations, DOE and intermittent leg swelling for which nuclear stress test was performed and showed no evidence of ischemia. Echo showed normal LVEF and patient was discharged from the cardiology clinic. She is referred back to cardiology clinic for similar symptoms of DOE. Echo was performed on 07/12/2022 showed LVEF 50 to 13%, grade 1 diastolic dysfunction and mild MR. NM stress test from 08/02/2022 showed no evidence of ischemia but nuclear LVEF was found to be 49%. She presented today for follow-up visit. She reported that she has been having DOE symptoms since her kidney shutdown 6 years ago and feels more short of breath during the weekends (on her nondialysis days). She did not notice any worsening of her DOE symptoms in the last 6 years and stated it has been stable. In addition, they had difficulty removing fluid in her dialysis sessions due to hypotension and patient has been wondering if they can spread out the dialysis sessions from 3 days/week to 4 days/week. She had multiple admissions in the last 1 year for volume overload requiring urgent hemodialysis and was also sent on home oxygen. Denies smoking cigarettes, illicit drug abuse and alcohol use.  She does have family history of ASCVD (her brothers have PCI/CABG).  Past Medical History:  Diagnosis Date   Anemia in ESRD (end-stage renal disease) ([place]) 08/18/2018   CHF (congestive heart failure) ([facility])    Coccyx contusion--with chronic pain due to  fall    Depression    Dyspnea    ESRD on hemodialysis ([facility])    Essential hypertension, benign    Gastric polyps    Gastroparesis    followed by Dr. Laural Golden.   GERD (gastroesophageal reflux disease)    Glomerulonephritis    Gout    Mixed hyperlipidemia    Spondylosis     Past Surgical History:  Procedure Laterality Date   APPENDECTOMY     AV FISTULA PLACEMENT Left 03/20/2022   Procedure: INSERTION OF LEFT UPPER ARM ARTERIOVENOUS (AV) GORE-TEX GRAFT;  Surgeon: Rosetta Posner, MD;  [facility];  Service: Vascular;  Laterality: Left;   CATARACT EXTRACTION     COLONOSCOPY N/A 12/28/2015   Procedure: COLONOSCOPY;  Surgeon: Rogene Houston, MD;  [facility];  Service: Endoscopy;  Laterality: N/A;  815   ESOPHAGOGASTRODUODENOSCOPY N/A 11/16/2020   Procedure: ESOPHAGOGASTRODUODENOSCOPY (EGD);  Surgeon: Rogene Houston, MD;  [facility];  Service: Endoscopy;  Laterality: N/A;  1:15   EXCHANGE OF A DIALYSIS CATHETER N/A 03/20/2022   Procedure: EXCHANGE OF A DIALYSIS CATHETER;  Surgeon: Rosetta Posner, MD;  [facility]: [facility];  Service: Vascular;  Laterality: N/A;   INSERTION OF DIALYSIS CATHETER Right 09/30/2021   Procedure: INSERTION OF TUNNELED DIALYSIS CATHETER;  Surgeon: Waynetta Sandy, MD;  Location: [facility];  Service: Vascular;  Laterality: Right;   IR FLUORO GUIDE CV LINE RIGHT  01/16/2019   IR REMOVAL TUN CV CATH W/O FL  07/01/2019   IR US GUIDE VASC ACCESS RIGHT  01/16/2019   POLYPECTOMY  11/16/2020   Procedure: POLYPECTOMY;  Surgeon: Rogene Houston, MD;  [facility];  Service: Endoscopy;;  gastric   REMOVAL OF A DIALYSIS CATHETER  09/30/2021   Procedure: REMOVAL OF A PERITONEAL DIALYSIS CATHETER;  Surgeon: Waynetta Sandy, MD;  Location: [place] [facility];  Service: Vascular;;   REMOVAL OF A DIALYSIS CATHETER N/A 05/22/2022   Procedure: MINOR REMOVAL OF A TUNNELED DIALYSIS CATHETER;  Surgeon: Rosetta Posner, MD;  [facility];  Service:  Vascular;  Laterality: N/A;   TUBAL LIGATION      Current Outpatient Medications  Medication Sig Dispense Refill   acetaminophen (TYLENOL) 325 MG tablet Take 2 tablets (650 mg total) by mouth every 6 (six) hours as needed for mild pain. (Patient taking differently: Take 325 mg by mouth every 6 (six) hours as needed for mild pain.)     camphor-menthol (SARNA) lotion Apply topically as needed for itching. (Patient taking differently: Apply 1 Application topically as needed for itching.) 222 mL 0   carvedilol (COREG) 12.5 MG tablet Take 1 tablet by mouth 2 (two) times daily.     famotidine (PEPCID) 40 MG tablet Take 1 tablet (40 mg total) by mouth at bedtime. (Patient taking differently: Take 40 mg by mouth at bedtime as needed for heartburn.) 30 tablet 1   hydrALAZINE (APRESOLINE) 50 MG tablet Take 50 mg by mouth 2 (two) times daily. Do not take morning dose on Dialysis days. Monday,Wednesday and Friday     lanthanum (FOSRENOL) 750 MG chewable tablet Chew 750 mg by mouth 3 (three) times daily with meals.     loperamide (IMODIUM) 2 MG capsule Take 1 capsule (2 mg total) by mouth as needed for diarrhea or loose stools. 30 capsule 0   multivitamin (RENA-VIT) TABS tablet Take 1 tablet by mouth daily. One daily     traZODone (DESYREL) 100 MG tablet Take 100 mg by mouth at bedtime.     No current facility-administered medications for this visit.   Allergies:  Cefepime, Morphine, and Sulfa antibiotics   Social History: The patient  reports that she has never smoked. She has never been exposed to tobacco smoke. She has never used smokeless tobacco. She reports that she does not drink alcohol and does not use drugs.   Family History: The patient's family history includes CAD in her father; Diabetes Mellitus II in her father; Heart attack in her father; Hypertension in her father; Lupus in her brother.   ROS:  Please see the history of present illness. Otherwise, complete review of systems is positive  for none.  All other systems are reviewed and negative.   Physical Exam: VS:  BP (!) 152/60   Pulse 72   Ht 4\' 11"  (1.499 m)   Wt 98 lb 9.6 oz (44.7 kg)   SpO2 98%   BMI 19.91 kg/m , BMI Body mass index is 19.91 kg/m.  Wt Readings from Last 3 Encounters:  08/16/22 98 lb 9.6 oz (44.7 kg)  08/09/22 97 lb 4.8 oz (44.1 kg)  07/26/22 98 lb (44.5 kg)    General: Patient appears comfortable at rest. HEENT: Conjunctiva and lids normal, oropharynx clear with moist mucosa. Neck: Supple, no elevated JVP or carotid bruits, no thyromegaly. Lungs: Clear to auscultation, nonlabored breathing at rest. Cardiac: Regular rate and rhythm, no S3 or significant systolic murmur, no pericardial rub. Abdomen: Soft, nontender, no hepatomegaly, bowel sounds present, no guarding or rebound. Extremities: No pitting edema Skin: Warm  and dry. Musculoskeletal: No kyphosis. Neuropsychiatric: Alert and oriented x3, affect grossly appropriate.  ECG:  NSR  Recent Labwork: 08/31/2021: TSH 0.865 04/22/2022: B Natriuretic Peptide 4,375.0 07/12/2022: ALT 13; AST 21; BUN 34; Creatinine, Ser 4.50; Hemoglobin 8.9; Magnesium 1.6; Platelets 189; Potassium 4.0; Sodium 136  No results found for: "CHOL", "TRIG", "HDL", "CHOLHDL", "VLDL", "LDLCALC", "LDLDIRECT"  Other Studies Reviewed Today: Echo from 06/2022 LVEF 50 to 94% Grade 1 diastolic dysfunction Mild MR  Assessment and Plan: Patient is 82 year old F known to have ESRD DD, HTN was referred to cardiology clinic for discussion of abnormal stress test.  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 She did not notice any worsening of her DOE symptoms in the last 6 years and stated it has been stable. In addition, they had difficulty removing fluid in her dialysis  sessions due to hypotension and patient has been wondering if they can spread out the dialysis sessions from 3 days/week to 4 days/week. She will need to discuss this with her nephrologist. Otherwise, she had multiple admissions in the last 1 year for volume overload and requiring urgent hemodialysis. She was also sent home with home oxygen.  She underwent NM stress test in 07/2022 with no evidence of ischemia and nuclear EF 49%. I do not think her DOE is anginal equivalent and needs CAD evaluation at this point.  However, if it worsens in the future despite adequate optimization of volume through dialysis sessions, she will need further evaluation with [facility].  # HTN, controlled -Continue carvedilol to 12.5 mg twice daily -Continue hydralazine 50 mg twice daily -Management of HTN per PCP  I have spent a total of 33 minutes with patient reviewing chart, EKGs, labs and examining patient as well as establishing an assessment and plan that was discussed with the patient.  > 50% of time was spent in direct patient care.      Medication Adjustments/Labs and Tests Ordered: Current medicines are reviewed at length with the patient today.  Concerns regarding medicines are outlined above.   Tests Ordered: No orders of the defined types were placed in this encounter.   Medication Changes: No orders of the defined types were placed in this encounter.   Disposition:  Follow up 6 months  Signed Markeesha Char Fidel Levy, MD, 08/16/2022 2:47 PM    [facility] [address] [address], [place], [place] [address]

## 2022-08-16 NOTE — Patient Instructions (Signed)

## 2022-08-18 LAB — CBC WITH DIFFERENTIAL/PLATELET
Absolute Monocytes: 539 cells/uL (ref 200–950)
Basophils Absolute: 49 cells/uL (ref 0–200)
Basophils Relative: 0.7 %
Eosinophils Absolute: 560 cells/uL — ABNORMAL HIGH (ref 15–500)
Eosinophils Relative: 8 %
HCT: 34.6 % — ABNORMAL LOW (ref 35.0–45.0)
Hemoglobin: 11.5 g/dL — ABNORMAL LOW (ref 11.7–15.5)
Lymphs Abs: 952 cells/uL (ref 850–3900)
MCH: 30.2 pg (ref 27.0–33.0)
MCHC: 33.2 g/dL (ref 32.0–36.0)
MCV: 90.8 fL (ref 80.0–100.0)
MPV: 10.5 fL (ref 7.5–12.5)
Monocytes Relative: 7.7 %
Neutro Abs: 4900 cells/uL (ref 1500–7800)
Neutrophils Relative %: 70 %
Platelets: 275 10*3/uL (ref 140–400)
RBC: 3.81 10*6/uL (ref 3.80–5.10)
RDW: 15.7 % — ABNORMAL HIGH (ref 11.0–15.0)
Total Lymphocyte: 13.6 %
WBC: 7 10*3/uL (ref 3.8–10.8)

## 2022-08-18 LAB — COMPREHENSIVE METABOLIC PANEL
AG Ratio: 1.2 (calc) (ref 1.0–2.5)
ALT: 12 U/L (ref 6–29)
AST: 17 U/L (ref 10–35)
Albumin: 3.7 g/dL (ref 3.6–5.1)
Alkaline phosphatase (APISO): 74 U/L (ref 37–153)
BUN/Creatinine Ratio: 10 (calc) (ref 6–22)
BUN: 53 mg/dL — ABNORMAL HIGH (ref 7–25)
CO2: 25 mmol/L (ref 20–32)
Calcium: 8.4 mg/dL — ABNORMAL LOW (ref 8.6–10.4)
Chloride: 96 mmol/L — ABNORMAL LOW (ref 98–110)
Creat: 5.47 mg/dL — ABNORMAL HIGH (ref 0.60–0.95)
Globulin: 3.2 g/dL (calc) (ref 1.9–3.7)
Glucose, Bld: 71 mg/dL (ref 65–139)
Potassium: 4.5 mmol/L (ref 3.5–5.3)
Sodium: 139 mmol/L (ref 135–146)
Total Bilirubin: 0.4 mg/dL (ref 0.2–1.2)
Total Protein: 6.9 g/dL (ref 6.1–8.1)

## 2022-08-18 LAB — CELIAC DISEASE PANEL
(tTG) Ab, IgA: <1 U/mL
(tTG) Ab, IgG: <1 U/mL
Gliadin IgA: <1 U/mL
Gliadin IgG: <1 U/mL
Immunoglobulin A: 102 mg/dL (ref 70–320)

## 2022-09-04 ENCOUNTER — Encounter (INDEPENDENT_AMBULATORY_CARE_PROVIDER_SITE_OTHER): Payer: Self-pay

## 2022-10-14 ENCOUNTER — Encounter (HOSPITAL_COMMUNITY): Payer: Self-pay | Admitting: Internal Medicine

## 2022-10-14 ENCOUNTER — Observation Stay (HOSPITAL_COMMUNITY)
Admission: EM | Admit: 2022-10-14 | Discharge: 2022-10-16 | Disposition: A | Discharge status: Home / Self Care | Payer: Medicare Other | Attending: Family Medicine | Admitting: Family Medicine

## 2022-10-14 ENCOUNTER — Emergency Department (HOSPITAL_COMMUNITY): Payer: Medicare Other

## 2022-10-14 ENCOUNTER — Other Ambulatory Visit: Payer: Self-pay

## 2022-10-14 DIAGNOSIS — R0602 Shortness of breath: Secondary | ICD-10-CM | POA: Insufficient documentation

## 2022-10-14 DIAGNOSIS — Z79899 Other long term (current) drug therapy: Secondary | ICD-10-CM | POA: Diagnosis not present

## 2022-10-14 DIAGNOSIS — Z992 Dependence on renal dialysis: Secondary | ICD-10-CM | POA: Diagnosis not present

## 2022-10-14 DIAGNOSIS — N3281 Overactive bladder: Secondary | ICD-10-CM | POA: Diagnosis present

## 2022-10-14 DIAGNOSIS — N186 End stage renal disease: Secondary | ICD-10-CM | POA: Diagnosis not present

## 2022-10-14 DIAGNOSIS — R112 Nausea with vomiting, unspecified: Principal | ICD-10-CM | POA: Insufficient documentation

## 2022-10-14 DIAGNOSIS — J9601 Acute respiratory failure with hypoxia: Secondary | ICD-10-CM | POA: Diagnosis not present

## 2022-10-14 DIAGNOSIS — E1122 Type 2 diabetes mellitus with diabetic chronic kidney disease: Secondary | ICD-10-CM | POA: Diagnosis not present

## 2022-10-14 DIAGNOSIS — K529 Noninfective gastroenteritis and colitis, unspecified: Secondary | ICD-10-CM | POA: Diagnosis present

## 2022-10-14 DIAGNOSIS — I10 Essential (primary) hypertension: Secondary | ICD-10-CM | POA: Diagnosis present

## 2022-10-14 DIAGNOSIS — R0609 Other forms of dyspnea: Secondary | ICD-10-CM | POA: Diagnosis present

## 2022-10-14 DIAGNOSIS — I132 Hypertensive heart and chronic kidney disease with heart failure and with stage 5 chronic kidney disease, or end stage renal disease: Secondary | ICD-10-CM | POA: Insufficient documentation

## 2022-10-14 DIAGNOSIS — R1011 Right upper quadrant pain: Secondary | ICD-10-CM | POA: Diagnosis present

## 2022-10-14 DIAGNOSIS — M810 Age-related osteoporosis without current pathological fracture: Secondary | ICD-10-CM | POA: Diagnosis present

## 2022-10-14 DIAGNOSIS — I5020 Unspecified systolic (congestive) heart failure: Secondary | ICD-10-CM | POA: Diagnosis not present

## 2022-10-14 DIAGNOSIS — K3184 Gastroparesis: Secondary | ICD-10-CM | POA: Diagnosis present

## 2022-10-14 DIAGNOSIS — E782 Mixed hyperlipidemia: Secondary | ICD-10-CM | POA: Diagnosis present

## 2022-10-14 DIAGNOSIS — Z1152 Encounter for screening for COVID-19: Secondary | ICD-10-CM | POA: Insufficient documentation

## 2022-10-14 DIAGNOSIS — R197 Diarrhea, unspecified: Secondary | ICD-10-CM | POA: Diagnosis present

## 2022-10-14 DIAGNOSIS — R0902 Hypoxemia: Principal | ICD-10-CM

## 2022-10-14 LAB — COMPREHENSIVE METABOLIC PANEL
ALT: 17 U/L (ref 0–44)
AST: 34 U/L (ref 15–41)
Albumin: 3.2 g/dL — ABNORMAL LOW (ref 3.5–5.0)
Alkaline Phosphatase: 80 U/L (ref 38–126)
Anion gap: 16 — ABNORMAL HIGH (ref 5–15)
BUN: 65 mg/dL — ABNORMAL HIGH (ref 8–23)
CO2: 23 mmol/L (ref 22–32)
Calcium: 8.6 mg/dL — ABNORMAL LOW (ref 8.9–10.3)
Chloride: 98 mmol/L (ref 98–111)
Creatinine, Ser: 6.01 mg/dL — ABNORMAL HIGH (ref 0.44–1.00)
GFR, Estimated: 7 mL/min — ABNORMAL LOW (ref >60)
Glucose, Bld: 101 mg/dL — ABNORMAL HIGH (ref 70–99)
Potassium: 4.2 mmol/L (ref 3.5–5.1)
Sodium: 137 mmol/L (ref 135–145)
Total Bilirubin: 1.1 mg/dL (ref 0.3–1.2)
Total Protein: 7 g/dL (ref 6.5–8.1)

## 2022-10-14 LAB — CBC
HCT: 31.1 % — ABNORMAL LOW (ref 36.0–46.0)
Hemoglobin: 10.1 g/dL — ABNORMAL LOW (ref 12.0–15.0)
MCH: 30.1 pg (ref 26.0–34.0)
MCHC: 32.5 g/dL (ref 30.0–36.0)
MCV: 92.6 fL (ref 80.0–100.0)
Platelets: 212 10*3/uL (ref 150–400)
RBC: 3.36 MIL/uL — ABNORMAL LOW (ref 3.87–5.11)
RDW: 16.7 % — ABNORMAL HIGH (ref 11.5–15.5)
WBC: 12.4 10*3/uL — ABNORMAL HIGH (ref 4.0–10.5)
nRBC: 0 % (ref 0.0–0.2)

## 2022-10-14 LAB — BRAIN NATRIURETIC PEPTIDE: B Natriuretic Peptide: >4500 pg/mL — ABNORMAL HIGH (ref 0.0–100.0)

## 2022-10-14 LAB — RESP PANEL BY RT-PCR (RSV, FLU A&B, COVID)  RVPGX2
Influenza A by PCR: NEGATIVE
Influenza B by PCR: NEGATIVE
Resp Syncytial Virus by PCR: NEGATIVE
SARS Coronavirus 2 by RT PCR: NEGATIVE

## 2022-10-14 LAB — HEPATITIS B SURFACE ANTIGEN: Hepatitis B Surface Ag: NONREACTIVE

## 2022-10-14 LAB — LIPASE, BLOOD: Lipase: 32 U/L (ref 11–51)

## 2022-10-14 MED ORDER — HYDROCOD POLI-CHLORPHE POLI ER 10-8 MG/5ML PO SUER
5.0000 mL | Freq: Two times a day (BID) | ORAL | Status: DC | PRN
  Administered 2022-10-14: 5 mL via ORAL
  Filled 2022-10-14: qty 5

## 2022-10-14 MED ORDER — HEPARIN SODIUM (PORCINE) 5000 UNIT/ML IJ SOLN
5000.0000 [IU] | Freq: Three times a day (TID) | INTRAMUSCULAR | Status: DC
  Administered 2022-10-14 – 2022-10-16 (×4): 5000 [IU] via SUBCUTANEOUS
  Filled 2022-10-14 (×5): qty 1

## 2022-10-14 MED ORDER — CHLORHEXIDINE GLUCONATE CLOTH 2 % EX PADS
6.0000 | MEDICATED_PAD | Freq: Every day | CUTANEOUS | Status: DC
  Administered 2022-10-15 – 2022-10-16 (×2): 6 via TOPICAL

## 2022-10-14 MED ORDER — CARVEDILOL 12.5 MG PO TABS
12.5000 mg | ORAL_TABLET | Freq: Two times a day (BID) | ORAL | Status: DC
  Administered 2022-10-14 – 2022-10-16 (×4): 12.5 mg via ORAL
  Filled 2022-10-14 (×5): qty 1

## 2022-10-14 MED ORDER — ONDANSETRON HCL 4 MG/2ML IJ SOLN: 4.0000 mg | Freq: Four times a day (QID) | INTRAMUSCULAR | Status: DC | PRN

## 2022-10-14 MED ORDER — RENA-VITE PO TABS
1.0000 | ORAL_TABLET | Freq: Every day | ORAL | Status: DC
  Administered 2022-10-14 – 2022-10-16 (×3): 1 via ORAL
  Filled 2022-10-14 (×3): qty 1

## 2022-10-14 MED ORDER — CALCIUM CARBONATE ANTACID 500 MG PO CHEW
1.0000 | CHEWABLE_TABLET | Freq: Three times a day (TID) | ORAL | Status: DC
  Administered 2022-10-15 – 2022-10-16 (×4): 200 mg via ORAL
  Filled 2022-10-14 (×4): qty 1

## 2022-10-14 MED ORDER — LOPERAMIDE HCL 2 MG PO CAPS
2.0000 mg | ORAL_CAPSULE | ORAL | Status: DC | PRN
  Administered 2022-10-14 – 2022-10-15 (×2): 2 mg via ORAL
  Filled 2022-10-14 (×2): qty 1

## 2022-10-14 MED ORDER — FAMOTIDINE 20 MG PO TABS
40.0000 mg | ORAL_TABLET | Freq: Every day | ORAL | Status: DC
  Administered 2022-10-16: 40 mg via ORAL
  Filled 2022-10-14 (×2): qty 2

## 2022-10-14 MED ORDER — HYDROMORPHONE HCL 1 MG/ML IJ SOLN
0.5000 mg | Freq: Once | INTRAMUSCULAR | Status: AC
  Administered 2022-10-14: 0.5 mg via INTRAVENOUS
  Filled 2022-10-14: qty 0.5

## 2022-10-14 MED ORDER — PENTAFLUOROPROP-TETRAFLUOROETH EX AERO: 1.0000 | INHALATION_SPRAY | CUTANEOUS | Status: DC | PRN

## 2022-10-14 MED ORDER — TRAZODONE HCL 50 MG PO TABS
100.0000 mg | ORAL_TABLET | Freq: Every day | ORAL | Status: DC
  Administered 2022-10-16: 100 mg via ORAL
  Filled 2022-10-14 (×3): qty 2

## 2022-10-14 MED ORDER — LOPERAMIDE HCL 2 MG PO CAPS: 2.0000 mg | ORAL_CAPSULE | ORAL | Status: DC | PRN

## 2022-10-14 MED ORDER — HEPARIN SODIUM (PORCINE) 1000 UNIT/ML DIALYSIS: 1000.0000 [IU] | INTRAMUSCULAR | Status: DC | PRN

## 2022-10-14 MED ORDER — ALBUTEROL SULFATE HFA 108 (90 BASE) MCG/ACT IN AERS: 2.0000 | INHALATION_SPRAY | RESPIRATORY_TRACT | Status: DC | PRN

## 2022-10-14 MED ORDER — ONDANSETRON HCL 4 MG/2ML IJ SOLN
4.0000 mg | Freq: Once | INTRAMUSCULAR | Status: AC | PRN
  Administered 2022-10-14: 4 mg via INTRAVENOUS
  Filled 2022-10-14: qty 2

## 2022-10-14 MED ORDER — HYDRALAZINE HCL 25 MG PO TABS
50.0000 mg | ORAL_TABLET | ORAL | Status: DC
  Administered 2022-10-14 – 2022-10-16 (×3): 50 mg via ORAL
  Filled 2022-10-14 (×3): qty 2

## 2022-10-14 MED ORDER — LIDOCAINE HCL (PF) 1 % IJ SOLN: 5.0000 mL | INTRAMUSCULAR | Status: DC | PRN

## 2022-10-14 MED ORDER — LANTHANUM CARBONATE 500 MG PO CHEW
750.0000 mg | CHEWABLE_TABLET | Freq: Three times a day (TID) | ORAL | Status: DC
  Filled 2022-10-14 (×3): qty 2

## 2022-10-14 MED ORDER — ACETAMINOPHEN 650 MG RE SUPP: 650.0000 mg | Freq: Four times a day (QID) | RECTAL | Status: DC | PRN

## 2022-10-14 MED ORDER — ACETAMINOPHEN 325 MG PO TABS
650.0000 mg | ORAL_TABLET | Freq: Four times a day (QID) | ORAL | Status: DC | PRN
  Administered 2022-10-14 – 2022-10-15 (×3): 650 mg via ORAL
  Filled 2022-10-14 (×3): qty 2

## 2022-10-14 MED ORDER — ONDANSETRON HCL 4 MG PO TABS: 4.0000 mg | ORAL_TABLET | Freq: Four times a day (QID) | ORAL | Status: DC | PRN

## 2022-10-14 MED ORDER — LIDOCAINE-PRILOCAINE 2.5-2.5 % EX CREA: 1.0000 | TOPICAL_CREAM | CUTANEOUS | Status: DC | PRN

## 2022-10-14 NOTE — H&P (Signed)
History and Physical    Essa Arehart [id number] DOB: 01/18/1941 DOA: 10/14/2022  PCP: [name], NP   Patient coming from: Home  Chief Complaint: Intractable N/V, dyspnea  HPI: Felicia Williams is a 82 y.o. female with medical history significant for ESRD on HD MWF, hypertension, GERD, dyslipidemia, chronic diarrhea, gastroparesis, and osteoporosis/spondylosis who presented to the ED with some worsening shortness of breath and mild hypoxemia as well as concerns for intractable nausea and vomiting.  She also claims to have some upper abdominal pain.  Denies any fevers or chills.  She states that she does have chronic diarrhea which has been ongoing for a long time.  She last completed her hemodialysis session this past Friday with no acute events or concerns noted.   ED Course: Vital signs stable and patient afebrile.  Leukocytosis of 12,000 noted and hemoglobin 10.1.  BUN 65 and creatinine 6.01.  BNP greater than 4500.  Flu and COVID studies negative.  CT abdomen with small ascites, with no other acute abnormalities noted.  Chest x-ray with some mild edema.   Review of Systems: Reviewed as noted above, otherwise negative.  Past Medical History:  Diagnosis Date   Anemia in ESRD (end-stage renal disease) ([place]) 08/18/2018   CHF (congestive heart failure) (HCC)    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 Laural Golden.   GERD (gastroesophageal reflux disease)    Glomerulonephritis    Gout    Mixed hyperlipidemia    Spondylosis     Past Surgical History:  Procedure Laterality Date   APPENDECTOMY     AV FISTULA PLACEMENT Left 03/20/2022   Procedure: INSERTION OF LEFT UPPER ARM ARTERIOVENOUS (AV) GORE-TEX GRAFT;  Surgeon: Rosetta Posner, MD;  Location: [facility];  Service: Vascular;  Laterality: Left;   CATARACT EXTRACTION      COLONOSCOPY N/A 12/28/2015   Procedure: COLONOSCOPY;  Surgeon: Rogene Houston, MD;  Location: [facility];  Service: Endoscopy;  Laterality: N/A;  815   ESOPHAGOGASTRODUODENOSCOPY N/A 11/16/2020   Procedure: ESOPHAGOGASTRODUODENOSCOPY (EGD);  Surgeon: Rogene Houston, MD;  [facility];  Service: Endoscopy;  Laterality: N/A;  1:15   EXCHANGE OF A DIALYSIS CATHETER N/A 03/20/2022   Procedure: EXCHANGE OF A DIALYSIS CATHETER;  Surgeon: Rosetta Posner, MD;  Location: [facility];  Service: Vascular;  Laterality: N/A;   INSERTION OF DIALYSIS CATHETER Right 09/30/2021   Procedure: INSERTION OF TUNNELED DIALYSIS CATHETER;  Surgeon: Waynetta Sandy, MD;  Location: [place];  Service: Vascular;  Laterality: Right;   IR FLUORO GUIDE CV LINE RIGHT  01/16/2019   IR REMOVAL TUN CV CATH W/O FL  07/01/2019   IR US GUIDE VASC ACCESS RIGHT  01/16/2019   POLYPECTOMY  11/16/2020   Procedure: POLYPECTOMY;  Surgeon: Rogene Houston, MD;  [facility];  Service: Endoscopy;;  gastric   REMOVAL OF A DIALYSIS CATHETER  09/30/2021   Procedure: REMOVAL OF A PERITONEAL DIALYSIS CATHETER;  Surgeon: Waynetta Sandy, MD;  Location: [place];  Service: Vascular;;   REMOVAL OF A DIALYSIS CATHETER N/A 05/22/2022   Procedure: MINOR REMOVAL OF A TUNNELED DIALYSIS CATHETER;  Surgeon: Rosetta Posner, MD;  Location: [facility];  Service: Vascular;  Laterality: N/A;   TUBAL LIGATION  reports that she has never smoked. She has never been exposed to tobacco smoke. She has never used smokeless tobacco. She reports that she does not drink alcohol and does not use drugs.  Allergies  Allergen Reactions   Cefepime Other (See Comments)    Feb 2023 Encephalopathy with questionable seizures. Seems to be tolerating cefazolin    Morphine Other (See Comments)    "Dry heaving like crazy"   Sulfa Antibiotics Other (See Comments)    Shut pt's kidneys down    Family History  Problem Relation Age of Onset   CAD  Father    Heart attack Father    Diabetes Mellitus II Father    Hypertension Father    Lupus Brother     Prior to Admission medications   Medication Sig Start Date End Date Taking? Authorizing Provider  acetaminophen (TYLENOL) 325 MG tablet Take 2 tablets (650 mg total) by mouth every 6 (six) hours as needed for mild pain. Patient taking differently: Take 325 mg by mouth every 6 (six) hours as needed for mild pain. 09/12/21   Love, Ivan Anchors, PA-C  camphor-menthol [facility]) lotion Apply topically as needed for itching. Patient taking differently: Apply 1 Application topically as needed for itching. 09/12/21   Love, Ivan Anchors, PA-C  carvedilol (COREG) 12.5 MG tablet Take 1 tablet by mouth 2 (two) times daily.    [provider]  famotidine (PEPCID) 40 MG tablet Take 1 tablet (40 mg total) by mouth at bedtime. Patient taking differently: Take 40 mg by mouth at bedtime as needed for heartburn. 11/28/21   Rogene Houston, MD  hydrALAZINE (APRESOLINE) 50 MG tablet Take 50 mg by mouth 2 (two) times daily. Do not take morning dose on Dialysis days. Monday,Wednesday and Friday    [provider]  lanthanum (FOSRENOL) 750 MG chewable tablet Chew 750 mg by mouth 3 (three) times daily with meals.    [provider]  loperamide (IMODIUM) 2 MG capsule Take 1 capsule (2 mg total) by mouth as needed for diarrhea or loose stools. 09/12/21   Love, Ivan Anchors, PA-C  multivitamin (RENA-VIT) TABS tablet Take 1 tablet by mouth daily. One daily    [provider]  traZODone (DESYREL) 100 MG tablet Take 100 mg by mouth at bedtime. 07/07/22   [provider]    Physical Exam: Vitals:   10/14/22 0830 10/14/22 0900 10/14/22 0930 10/14/22 1000  BP: (!) 152/63 (!) 145/65 (!) 149/68 (!) 156/78  Pulse: 94 92 92 93  Resp: (!) 24 (!) 21 (!) 24 (!) 28  Temp:      TempSrc:      SpO2: 94% 94% 95% 95%  Weight:      Height:        Constitutional: NAD, calm, comfortable Vitals:    10/14/22 0830 10/14/22 0900 10/14/22 0930 10/14/22 1000  BP: (!) 152/63 (!) 145/65 (!) 149/68 (!) 156/78  Pulse: 94 92 92 93  Resp: (!) 24 (!) 21 (!) 24 (!) 28  Temp:      TempSrc:      SpO2: 94% 94% 95% 95%  Weight:      Height:       Eyes: lids and conjunctivae normal Neck: normal, supple Respiratory: clear to auscultation bilaterally. Normal respiratory effort. No accessory muscle use.  2 L nasal cannula. Cardiovascular: Regular rate and rhythm, no murmurs. Abdomen: no tenderness, no distention. Bowel sounds positive.  Musculoskeletal:  No edema. Skin: no rashes, lesions, ulcers.  Psychiatric:  Flat affect  Labs on Admission: I have personally reviewed following labs and imaging studies  CBC: Recent Labs  Lab 10/14/22 0751  WBC 12.4*  HGB 10.1*  HCT 31.1*  MCV 92.6  PLT 99991111   Basic Metabolic Panel: Recent Labs  Lab 10/14/22 0751  NA 137  K 4.2  CL 98  CO2 23  GLUCOSE 101*  BUN 65*  CREATININE 6.01*  CALCIUM 8.6*   GFR: Estimated Creatinine Clearance: 4.9 mL/min (A) (by C-G formula based on SCr of 6.01 mg/dL (H)). Liver Function Tests: Recent Labs  Lab 10/14/22 0751  AST 34  ALT 17  ALKPHOS 80  BILITOT 1.1  PROT 7.0  ALBUMIN 3.2*   Recent Labs  Lab 10/14/22 0801  LIPASE 32   No results for input(s): "AMMONIA" in the last 168 hours. Coagulation Profile: No results for input(s): "INR", "PROTIME" in the last 168 hours. Cardiac Enzymes: No results for input(s): "CKTOTAL", "CKMB", "CKMBINDEX", "TROPONINI" in the last 168 hours. BNP (last 3 results) No results for input(s): "PROBNP" in the last 8760 hours. HbA1C: No results for input(s): "HGBA1C" in the last 72 hours. CBG: No results for input(s): "GLUCAP" in the last 168 hours. Lipid Profile: No results for input(s): "CHOL", "HDL", "LDLCALC", "TRIG", "CHOLHDL", "LDLDIRECT" in the last 72 hours. Thyroid Function Tests: No results for input(s): "TSH", "T4TOTAL", "FREET4", "T3FREE", "THYROIDAB" in  the last 72 hours. Anemia Panel: No results for input(s): "VITAMINB12", "FOLATE", "FERRITIN", "TIBC", "IRON", "RETICCTPCT" in the last 72 hours. Urine analysis:    Component Value Date/Time   COLORURINE COLORLESS (A) 08/29/2021 0827   APPEARANCEUR CLEAR 08/29/2021 0827   LABSPEC 1.005 08/29/2021 0827   PHURINE 8.0 08/29/2021 0827   GLUCOSEU >=500 (A) 08/29/2021 0827   HGBUR NEGATIVE 08/29/2021 0827   BILIRUBINUR NEGATIVE 08/29/2021 0827   KETONESUR NEGATIVE 08/29/2021 0827   PROTEINUR 30 (A) 08/29/2021 0827   UROBILINOGEN 0.2 01/11/2014 1635   NITRITE NEGATIVE 08/29/2021 0827   LEUKOCYTESUR NEGATIVE 08/29/2021 0827    Radiological Exams on Admission: CT ABDOMEN PELVIS WO CONTRAST  Result Date: 10/14/2022 CLINICAL DATA:  Abdominal pain, acute, nonlocalized, nausea, hypoxia EXAM: CT ABDOMEN AND PELVIS WITHOUT CONTRAST TECHNIQUE: Multidetector CT imaging of the abdomen and pelvis was performed following the standard protocol without IV contrast. RADIATION DOSE REDUCTION: This exam was performed according to the departmental dose-optimization program which includes automated exposure control, adjustment of the mA and/or kV according to patient size and/or use of iterative reconstruction technique. COMPARISON:  07/11/2022 and previous FINDINGS: Lower chest: Persistent moderate pleural effusions. Patchy atelectasis/consolidation in the lung bases. Heart size upper limits normal. No pericardial effusion. Blood pool hypodense compared to the interventricular septum suggesting anemia. Hepatobiliary: No focal liver abnormality is seen. No gallstones, gallbladder wall thickening, or biliary dilatation. 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 No ductal dilatation or regional inflammatory change. Spleen: Normal in size, with few scattered vascular calcifications. Adrenals/Urinary Tract: No adrenal mass. Marked bilateral renal parenchymal atrophy with  stable cystic lesion in the left upper pole, as described on previous MR 12/09/2020. No hydronephrosis. Urinary bladder is decompressed. Stomach/Bowel: Stomach decompressed. Small bowel nondilated. Post appendectomy. The colon is partially distended by gas and fecal material, without acute finding. Vascular/Lymphatic: Heavy aortoiliac calcified atheromatous plaque without aneurysm. No abdominal or pelvic adenopathy. Reproductive: Uterus and bilateral adnexa are unremarkable. Other: Trace pelvic and perihepatic ascites.  No free air. Musculoskeletal: Osteitis pubis. Lower lumbar facet DJD allowing grade 1  anterolisthesis L4-5 as before. No fracture or worrisome bone lesion. IMPRESSION: 1. No acute findings. 2. Persistent moderate bilateral pleural effusions. 3. Small amount of abdominal and pelvic ascites. 4. Probable anemia. 5. 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 Current guidelines recommend reimaging every 2 years x2, for a total of 10 years of stability. 6.  Aortic Atherosclerosis (ICD10-I70.0). Electronically Signed   By: Lucrezia Europe M.D.   On: 10/14/2022 08:36   DG Chest Port 1 View  Result Date: 10/14/2022 CLINICAL DATA:  Shortness of breath. EXAM: PORTABLE CHEST 1 VIEW COMPARISON:  04/29/2022 FINDINGS: The cardio pericardial silhouette is enlarged. Diffuse interstitial opacity is progressive in the interval, likely reflecting edema. Tiny bilateral pleural effusions suspected. Bones are diffusely demineralized. Telemetry leads overlie the chest. IMPRESSION: Interval progression of diffuse interstitial opacity, likely reflecting edema. Electronically Signed   By: Misty Stanley M.D.   On: 10/14/2022 08:07     Assessment/Plan Principal Problem:   Nausea and vomiting Active Problems:   ESRD on dialysis [facility])   Essential hypertension, benign   Gastroparesis   Age-related osteoporosis without current pathological fracture   Mixed hyperlipidemia   Overactive bladder   DOE (dyspnea on exertion)    Chronic diarrhea   Acute hypoxemic respiratory failure (HCC)    Intractable nausea and vomiting -Gastroenteritis versus diabetic gastroparesis -Monitor for now as patient is demanding diet, Zofran as needed -Follows with Dr. Jenetta Downer outpatient -May need to consider Reglan  Dyspnea on exertion with mild hypoxemia -Currently wears oxygen as needed -Flu and COVID studies negative -Chest x-ray with some edema -Follows with cardiology Dr. Dellia Cloud outpatient and has had stress test 07/2022 with no evidence of ischemia -Appears to require adequate optimization of volume through dialysis sessions  ESRD on HD MWF -Nephrology consulted for hemodialysis during inpatient stay  Hypertension -Continue carvedilol and hydralazine  GERD -PPI  Chronic diarrhea -Being worked up by GI outpatient with celiac disease panel -Imodium as needed  DVT prophylaxis: Heparin Code Status: Full Family Communication: Daughter at bedside 3/24 Disposition Plan:Admit for gastroenteritis Consults called:Nephrology Admission status: Inpatient, Tele  Severity of Illness: The appropriate patient status for this patient is INPATIENT. Inpatient status is judged to be reasonable and necessary in order to provide the required intensity of service to ensure the patient's safety. The patient's presenting symptoms, physical exam findings, and initial radiographic and laboratory data in the context of their chronic comorbidities is felt to place them at high risk for further clinical deterioration. Furthermore, it is not anticipated that the patient will be medically stable for discharge from the hospital within 2 midnights of admission.   * I certify that at the point of admission it is my clinical judgment that the patient will require inpatient hospital care spanning beyond 2 midnights from the point of admission due to high intensity of service, high risk for further deterioration and high frequency of surveillance  required.*   Terrence Pizana D Montavis Schubring DO [facility]  If 7PM-7AM, please contact night-coverage [email or web address]  10/14/2022, 11:06 AM

## 2022-10-14 NOTE — Progress Notes (Signed)
Pt refused Lanthanum Carbonate states this makes her sick and kidney doctor discontinued this medication. MD informed.

## 2022-10-14 NOTE — ED Notes (Signed)
Pt does not urinate.  

## 2022-10-14 NOTE — ED Triage Notes (Signed)
Pt arrived [facility] with SOB, Abd pain for 3 days. Pt has had a cold with a cough and she has diarrhea normally. Pt became nauseous today and more SOB. Pt has prn oxygen at home 2lpm. Pt was 88% RA when EMS arrived. Pt gets dialysis M/W/F and last one on Fri.

## 2022-10-14 NOTE — Progress Notes (Signed)
Brief Nephrology Note  MWF ESRD patient presents w/ n/v possible gastroparesis vs viral gastritis. Dialysis ordered for tomorrow and can see formally at that time.

## 2022-10-14 NOTE — ED Provider Notes (Signed)
[facility] Provider Note   CSN: [id number] Arrival date & time: 10/14/22  [id number]     History {Add pertinent medical, surgical, social history, OB history to HPI:1} Chief Complaint  Patient presents with   Shortness of Breath   Abdominal Pain    Felicia Williams is a 82 y.o. female.  Patient complains of some shortness of breath.  She has had a cough since Wednesday.  Patient has a history of heart failure and is a dialysis patient.  She also complains of some epigastric and right upper quadrant abdominal discomfort   Shortness of Breath Associated symptoms: abdominal pain   Abdominal Pain Associated symptoms: shortness of breath        Home Medications Prior to Admission medications   Medication Sig Start Date End Date Taking? Authorizing Provider  acetaminophen (TYLENOL) 325 MG tablet Take 2 tablets (650 mg total) by mouth every 6 (six) hours as needed for mild pain. Patient taking differently: Take 325 mg by mouth every 6 (six) hours as needed for mild pain. 09/12/21   Love, Ivan Anchors, PA-C  camphor-menthol [facility]) lotion Apply topically as needed for itching. Patient taking differently: Apply 1 Application topically as needed for itching. 09/12/21   Love, Ivan Anchors, PA-C  carvedilol (COREG) 12.5 MG tablet Take 1 tablet by mouth 2 (two) times daily.    [provider]  famotidine (PEPCID) 40 MG tablet Take 1 tablet (40 mg total) by mouth at bedtime. Patient taking differently: Take 40 mg by mouth at bedtime as needed for heartburn. 11/28/21   Rogene Houston, MD  hydrALAZINE (APRESOLINE) 50 MG tablet Take 50 mg by mouth 2 (two) times daily. Do not take morning dose on Dialysis days. Monday,Wednesday and Friday    [provider]  lanthanum (FOSRENOL) 750 MG chewable tablet Chew 750 mg by mouth 3 (three) times daily with meals.    [provider]  loperamide (IMODIUM) 2 MG capsule Take 1 capsule (2 mg total)  by mouth as needed for diarrhea or loose stools. 09/12/21   Love, Ivan Anchors, PA-C  multivitamin (RENA-VIT) TABS tablet Take 1 tablet by mouth daily. One daily    [provider]  traZODone (DESYREL) 100 MG tablet Take 100 mg by mouth at bedtime. 07/07/22   [provider]      Allergies    Cefepime, Morphine, and Sulfa antibiotics    Review of Systems   Review of Systems  Respiratory:  Positive for shortness of breath.   Gastrointestinal:  Positive for abdominal pain.    Physical Exam Updated Vital Signs BP (!) 156/78   Pulse 93   Temp 98.5 F (36.9 C) (Oral)   Resp (!) 28   Ht 4\' 11"  (1.499 m)   Wt 44.1 kg   SpO2 95%   BMI 19.64 kg/m  Physical Exam  ED Results / Procedures / Treatments   Labs (all labs ordered are listed, but only abnormal results are displayed) Labs Reviewed  COMPREHENSIVE METABOLIC PANEL - Abnormal; Notable for the following components:      Result Value   Glucose, Bld 101 (*)    BUN 65 (*)    Creatinine, Ser 6.01 (*)    Calcium 8.6 (*)    Albumin 3.2 (*)    GFR, Estimated 7 (*)    Anion gap 16 (*)    All other components within normal limits  CBC - Abnormal; Notable for the following components:  WBC 12.4 (*)    RBC 3.36 (*)    Hemoglobin 10.1 (*)    HCT 31.1 (*)    RDW 16.7 (*)    All other components within normal limits  BRAIN NATRIURETIC PEPTIDE - Abnormal; Notable for the following components:   B Natriuretic Peptide >4,500.0 (*)    All other components within normal limits  RESP PANEL BY RT-PCR (RSV, FLU A&B, COVID)  [id number]  LIPASE, BLOOD    EKG None  Radiology CT ABDOMEN PELVIS WO CONTRAST  Result Date: 10/14/2022 CLINICAL DATA:  Abdominal pain, acute, nonlocalized, nausea, hypoxia EXAM: CT ABDOMEN AND PELVIS WITHOUT CONTRAST TECHNIQUE: Multidetector CT imaging of the abdomen and pelvis was performed following the standard protocol without IV contrast. RADIATION DOSE REDUCTION: This exam was performed according  to the departmental dose-optimization program which includes automated exposure control, adjustment of the mA and/or kV according to patient size and/or use of iterative reconstruction technique. COMPARISON:  07/11/2022 and previous FINDINGS: Lower chest: Persistent moderate pleural effusions. Patchy atelectasis/consolidation in the lung bases. Heart size upper limits normal. No pericardial effusion. Blood pool hypodense compared to the interventricular septum suggesting anemia. Hepatobiliary: No focal liver abnormality is seen. No gallstones, gallbladder wall thickening, or biliary dilatation. 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 No ductal dilatation or regional inflammatory change. Spleen: Normal in size, with few scattered vascular calcifications. Adrenals/Urinary Tract: No adrenal mass. Marked bilateral renal parenchymal atrophy with stable cystic lesion in the left upper pole, as described on previous MR 12/09/2020. No hydronephrosis. Urinary bladder is decompressed. Stomach/Bowel: Stomach decompressed. Small bowel nondilated. Post appendectomy. The colon is partially distended by gas and fecal material, without acute finding. Vascular/Lymphatic: Heavy aortoiliac calcified atheromatous plaque without aneurysm. No abdominal or pelvic adenopathy. Reproductive: Uterus and bilateral adnexa are unremarkable. Other: Trace pelvic and perihepatic ascites.  No free air. Musculoskeletal: Osteitis pubis. Lower lumbar facet DJD allowing grade 1 anterolisthesis L4-5 as before. No fracture or worrisome bone lesion. IMPRESSION: 1. No acute findings. 2. Persistent moderate bilateral pleural effusions. 3. Small amount of abdominal and pelvic ascites. 4. Probable anemia. 5. 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 Current guidelines recommend reimaging every 2 years x2, for a total of 10 years of stability. 6.  Aortic Atherosclerosis (ICD10-I70.0). Electronically Signed    By: Lucrezia Europe M.D.   On: 10/14/2022 08:36   DG Chest Port 1 View  Result Date: 10/14/2022 CLINICAL DATA:  Shortness of breath. EXAM: PORTABLE CHEST 1 VIEW COMPARISON:  04/29/2022 FINDINGS: The cardio pericardial silhouette is enlarged. Diffuse interstitial opacity is progressive in the interval, likely reflecting edema. Tiny bilateral pleural effusions suspected. Bones are diffusely demineralized. Telemetry leads overlie the chest. IMPRESSION: Interval progression of diffuse interstitial opacity, likely reflecting edema. Electronically Signed   By: Misty Stanley M.D.   On: 10/14/2022 08:07    Procedures Procedures  {Document cardiac monitor, telemetry assessment procedure when appropriate:1}  Medications Ordered in ED Medications  albuterol (VENTOLIN HFA) 108 (90 Base) MCG/ACT inhaler 2 puff (has no administration in time range)  ondansetron (ZOFRAN) injection 4 mg (4 mg Intravenous Given 10/14/22 0804)  HYDROmorphone (DILAUDID) injection 0.5 mg (0.5 mg Intravenous Given 10/14/22 0805)    ED Course/ Medical Decision Making/ A&P   {   Click here for ABCD2, HEART and other calculatorsREFRESH Note before signing :1}  Medical Decision Making Amount and/or Complexity of Data Reviewed Labs: ordered. Radiology: ordered.  Risk Prescription drug management. Decision regarding hospitalization.   Patient with worsening congestive heart failure with hypoxia on room air.  She will be admitted to medicine and will get her dialysis  {Document critical care time when appropriate:1} {Document review of labs and clinical decision tools ie heart score, Chads2Vasc2 etc:1}  {Document your independent review of radiology images, and any outside records:1} {Document your discussion with family members, caretakers, and with consultants:1} {Document social determinants of health affecting pt's care:1} {Document your decision making why or why not admission, treatments were  needed:1} Final Clinical Impression(s) / ED Diagnoses Final diagnoses:  Hypoxia  Systolic congestive heart failure, unspecified HF chronicity ([place])    Rx / DC Orders ED Discharge Orders     None

## 2022-10-15 DIAGNOSIS — R112 Nausea with vomiting, unspecified: Secondary | ICD-10-CM | POA: Diagnosis not present

## 2022-10-15 LAB — CBC
HCT: 28.2 % — ABNORMAL LOW (ref 36.0–46.0)
Hemoglobin: 9.3 g/dL — ABNORMAL LOW (ref 12.0–15.0)
MCH: 30.5 pg (ref 26.0–34.0)
MCHC: 33 g/dL (ref 30.0–36.0)
MCV: 92.5 fL (ref 80.0–100.0)
Platelets: 193 10*3/uL (ref 150–400)
RBC: 3.05 MIL/uL — ABNORMAL LOW (ref 3.87–5.11)
RDW: 16.9 % — ABNORMAL HIGH (ref 11.5–15.5)
WBC: 5.4 10*3/uL (ref 4.0–10.5)
nRBC: 0 % (ref 0.0–0.2)

## 2022-10-15 LAB — COMPREHENSIVE METABOLIC PANEL
ALT: 19 U/L (ref 0–44)
AST: 31 U/L (ref 15–41)
Albumin: 2.9 g/dL — ABNORMAL LOW (ref 3.5–5.0)
Alkaline Phosphatase: 72 U/L (ref 38–126)
Anion gap: 17 — ABNORMAL HIGH (ref 5–15)
BUN: 90 mg/dL — ABNORMAL HIGH (ref 8–23)
CO2: 23 mmol/L (ref 22–32)
Calcium: 8 mg/dL — ABNORMAL LOW (ref 8.9–10.3)
Chloride: 95 mmol/L — ABNORMAL LOW (ref 98–111)
Creatinine, Ser: 7.67 mg/dL — ABNORMAL HIGH (ref 0.44–1.00)
GFR, Estimated: 5 mL/min — ABNORMAL LOW (ref >60)
Glucose, Bld: 96 mg/dL (ref 70–99)
Potassium: 4.7 mmol/L (ref 3.5–5.1)
Sodium: 135 mmol/L (ref 135–145)
Total Bilirubin: 1 mg/dL (ref 0.3–1.2)
Total Protein: 6.2 g/dL — ABNORMAL LOW (ref 6.5–8.1)

## 2022-10-15 LAB — MAGNESIUM: Magnesium: 2.2 mg/dL (ref 1.7–2.4)

## 2022-10-15 MED ORDER — HEPARIN SODIUM (PORCINE) 1000 UNIT/ML IJ SOLN
INTRAMUSCULAR | Status: AC
  Administered 2022-10-15: 1000 [IU] via INTRAVENOUS_CENTRAL
  Filled 2022-10-15: qty 1

## 2022-10-15 MED ORDER — PENTAFLUOROPROP-TETRAFLUOROETH EX AERO
INHALATION_SPRAY | CUTANEOUS | Status: AC
  Administered 2022-10-15: 1 via TOPICAL
  Filled 2022-10-15: qty 30

## 2022-10-15 MED ORDER — HYDROMORPHONE HCL 1 MG/ML IJ SOLN: 0.5000 mg | INTRAMUSCULAR | Status: DC | PRN

## 2022-10-15 MED ORDER — HYDROCODONE-ACETAMINOPHEN 7.5-325 MG PO TABS
1.0000 | ORAL_TABLET | Freq: Four times a day (QID) | ORAL | Status: DC | PRN
  Administered 2022-10-15: 1 via ORAL
  Filled 2022-10-15: qty 1

## 2022-10-15 MED ORDER — GUAIFENESIN-DM 100-10 MG/5ML PO SYRP
5.0000 mL | ORAL_SOLUTION | Freq: Three times a day (TID) | ORAL | Status: DC
  Administered 2022-10-15 – 2022-10-16 (×2): 5 mL via ORAL
  Filled 2022-10-15 (×3): qty 5

## 2022-10-15 MED ORDER — ALUM & MAG HYDROXIDE-SIMETH 200-200-20 MG/5ML PO SUSP: 30.0000 mL | Freq: Three times a day (TID) | ORAL | 0 refills | Status: DC

## 2022-10-15 MED ORDER — SENNOSIDES-DOCUSATE SODIUM 8.6-50 MG PO TABS: 1.0000 | ORAL_TABLET | Freq: Every day | ORAL | 0 refills | Status: AC

## 2022-10-15 MED ORDER — ALUM & MAG HYDROXIDE-SIMETH 200-200-20 MG/5ML PO SUSP
30.0000 mL | Freq: Three times a day (TID) | ORAL | Status: DC
  Administered 2022-10-15 – 2022-10-16 (×3): 30 mL via ORAL
  Filled 2022-10-15 (×4): qty 30

## 2022-10-15 NOTE — Progress Notes (Signed)
Mobility Specialist Progress Note:    10/15/22 1130  Mobility  Activity Ambulated with assistance in room  Level of Assistance Standby assist, set-up cues, supervision of patient - no hands on  Assistive Device None  Distance Ambulated (ft) 160 ft  Activity Response Tolerated well  Mobility Referral Yes  $Mobility charge 1 Mobility   Pt received ambulating in room not wearing [place], SpO2 87% on RA. Agreeable to mobility session, connected [place] to tank, SpO2 96% on 1L. Tolerated session well, c/o back pain. Returned pt to room, all needs met.   Royetta Crochet Mobility Specialist Please contact via Solicitor or  Rehab office at [phone]

## 2022-10-15 NOTE — Discharge Summary (Signed)
Physician Discharge Summary   Patient: Felicia Williams MRN: [id number] DOB: May 01, 1941  Admit date:     10/14/2022  Discharge date: 10/15/22  Discharge Physician: Deatra James   PCP: [name], NP   Recommendations at discharge:  -Follow-up with PCP in 1-2 weeks -Follow-up with nephrologist in 1 week, continue scheduled hemodialysis - Caution with narcotics as may increase symptoms of constipation, ileus,   Discharge Diagnoses: Principal Problem:   Nausea and vomiting Active Problems:   ESRD on dialysis [facility])   Essential hypertension, benign   Gastroparesis   Age-related osteoporosis without current pathological fracture   Mixed hyperlipidemia   Overactive bladder   DOE (dyspnea on exertion)   Chronic diarrhea   Acute hypoxemic respiratory failure ([place])  Resolved Problems:   * No resolved hospital problems. *  Felicia Williams is a 82 y.o. female with medical history significant for ESRD on HD MWF, hypertension, GERD, dyslipidemia, chronic diarrhea, gastroparesis, and osteoporosis/spondylosis who presented to the ED with some worsening shortness of breath and mild hypoxemia as well as concerns for intractable nausea and vomiting.  She also claims to have some upper abdominal pain.  Denies any fevers or chills.  She states that she does have chronic diarrhea which has been ongoing for a long time.  She last completed her hemodialysis session this past Friday with no acute events or concerns noted.   ED Course: Vital signs stable and patient afebrile.  Leukocytosis of 12,000 noted and hemoglobin 10.1.  BUN 65 and creatinine 6.01.  BNP greater than 4500.  Flu and COVID studies negative.  CT abdomen with small ascites, with no other acute abnormalities noted.  Chest x-ray with some mild edema.     Intractable nausea and vomiting -Resolved, tolerating p.o. -Zofran as needed -Follows with Dr. Jenetta Downer outpatient -PRN  Reglan   Dyspnea on exertion with mild  hypoxemia -Currently wears oxygen as needed -Flu and COVID studies negative -Chest x-ray with some edema -Follows with cardiology Dr. Dellia Cloud outpatient and has had stress test 07/2022 with no evidence of ischemia -Appears to require adequate optimization of volume through dialysis sessions   ESRD on HD MWF -Nephrology consulted for hemodialysis during inpatient stay -Planning for hemodialysis today 10/15/2022  Hypertension -Continue carvedilol and hydralazine   GERD -PPI   Chronic diarrhea -Being worked up by GI outpatient with celiac disease panel -Imodium as needed        Consultants: Nephrologist Procedures performed: Hemodialysis Disposition: Home Diet recommendation:  Discharge Diet Orders (From admission, onward)     Start     Ordered   10/15/22 0000  Diet - low sodium heart healthy        10/15/22 1214           Cardiac and Carb modified diet DISCHARGE MEDICATION: Allergies as of 10/15/2022       Reactions   Cefepime Other (See Comments)   Feb 2023 Encephalopathy with questionable seizures. Seems to be tolerating cefazolin    Morphine Other (See Comments)   "Dry heaving like crazy"   Sulfa Antibiotics Other (See Comments)   Shut pt's kidneys down        Medication List     STOP taking these medications    lanthanum 750 MG chewable tablet Commonly known as: FOSRENOL       TAKE these medications    acetaminophen 325 MG tablet Commonly known as: TYLENOL Take 2 tablets (650 mg total) by mouth every 6 (six) hours as needed for  mild pain. What changed: how much to take   alum & mag hydroxide-simeth 200-200-20 MG/5ML suspension Commonly known as: MAALOX/MYLANTA Take 30 mLs by mouth 3 (three) times daily.   camphor-menthol lotion Commonly known as: SARNA Apply topically as needed for itching. What changed: how much to take   carvedilol 12.5 MG tablet Commonly known as: COREG Take 1 tablet by mouth 2 (two) times daily.   famotidine  40 MG tablet Commonly known as: Pepcid Take 1 tablet (40 mg total) by mouth at bedtime. What changed:  when to take this reasons to take this   hydrALAZINE 50 MG tablet Commonly known as: APRESOLINE Take 50 mg by mouth 2 (two) times daily. Do not take morning dose on Dialysis days. Monday,Wednesday and Friday   loperamide 2 MG capsule Commonly known as: IMODIUM Take 1 capsule (2 mg total) by mouth as needed for diarrhea or loose stools.   multivitamin Tabs tablet Take 1 tablet by mouth daily. One daily   pantoprazole 40 MG tablet Commonly known as: PROTONIX   senna-docusate 8.6-50 MG tablet Commonly known as: Senokot-S Take 1 tablet by mouth daily for 5 days.   traZODone 100 MG tablet Commonly known as: DESYREL Take 100 mg by mouth at bedtime.               Discharge Care Instructions  (From admission, onward)           Start     Ordered   10/15/22 0000  Discharge wound care:       Comments: Per nursing wound instructions   10/15/22 1214            Discharge Exam: Filed Weights   10/14/22 0729  Weight: 44.1 kg        General:  AAO x 3,  cooperative, no distress;   HEENT:  Normocephalic, PERRL, otherwise with in Normal limits   Neuro:  CNII-XII intact. , normal motor and sensation, reflexes intact   Lungs:   Clear to auscultation BL, Respirations unlabored,  No wheezes / crackles  Cardio:    S1/S2, RRR, No murmure, No Rubs or Gallops   Abdomen:  Soft, non-tender, bowel sounds active all four quadrants, no guarding or peritoneal signs.  Muscular  skeletal:  Limited exam -global generalized weaknesses - in bed, able to move all 4 extremities,   2+ pulses,  symmetric, No pitting edema  Skin:  Dry, warm to touch, negative for any Rashes,  Wounds: Please see nursing documentation  Pressure Injury 04/22/22 Sacrum Right Stage 1 -  Intact skin with non-blanchable redness of a localized area usually over a bony prominence. (Active)  04/22/22 2330   Location: Sacrum  Location Orientation: Right  Staging: Stage 1 -  Intact skin with non-blanchable redness of a localized area usually over a bony prominence.  Wound Description (Comments):   Present on Admission: Yes          Condition at discharge: good  The results of significant diagnostics from this hospitalization (including imaging, microbiology, ancillary and laboratory) are listed below for reference.   Imaging Studies: CT ABDOMEN PELVIS WO CONTRAST  Result Date: 10/14/2022 CLINICAL DATA:  Abdominal pain, acute, nonlocalized, nausea, hypoxia EXAM: CT ABDOMEN AND PELVIS WITHOUT CONTRAST TECHNIQUE: Multidetector CT imaging of the abdomen and pelvis was performed following the standard protocol without IV contrast. RADIATION DOSE REDUCTION: This exam was performed according to the departmental dose-optimization program which includes automated exposure control, adjustment of the mA and/or kV according  to patient size and/or use of iterative reconstruction technique. COMPARISON:  07/11/2022 and previous FINDINGS: Lower chest: Persistent moderate pleural effusions. Patchy atelectasis/consolidation in the lung bases. Heart size upper limits normal. No pericardial effusion. Blood pool hypodense compared to the interventricular septum suggesting anemia. Hepatobiliary: No focal liver abnormality is seen. No gallstones, gallbladder wall thickening, or biliary dilatation. 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 No ductal dilatation or regional inflammatory change. Spleen: Normal in size, with few scattered vascular calcifications. Adrenals/Urinary Tract: No adrenal mass. Marked bilateral renal parenchymal atrophy with stable cystic lesion in the left upper pole, as described on previous MR 12/09/2020. No hydronephrosis. Urinary bladder is decompressed. Stomach/Bowel: Stomach decompressed. Small bowel nondilated. Post appendectomy. The colon  is partially distended by gas and fecal material, without acute finding. Vascular/Lymphatic: Heavy aortoiliac calcified atheromatous plaque without aneurysm. No abdominal or pelvic adenopathy. Reproductive: Uterus and bilateral adnexa are unremarkable. Other: Trace pelvic and perihepatic ascites.  No free air. Musculoskeletal: Osteitis pubis. Lower lumbar facet DJD allowing grade 1 anterolisthesis L4-5 as before. No fracture or worrisome bone lesion. IMPRESSION: 1. No acute findings. 2. Persistent moderate bilateral pleural effusions. 3. Small amount of abdominal and pelvic ascites. 4. Probable anemia. 5. 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 Current guidelines recommend reimaging every 2 years x2, for a total of 10 years of stability. 6.  Aortic Atherosclerosis (ICD10-I70.0). Electronically Signed   By: Lucrezia Europe M.D.   On: 10/14/2022 08:36   DG Chest Port 1 View  Result Date: 10/14/2022 CLINICAL DATA:  Shortness of breath. EXAM: PORTABLE CHEST 1 VIEW COMPARISON:  04/29/2022 FINDINGS: The cardio pericardial silhouette is enlarged. Diffuse interstitial opacity is progressive in the interval, likely reflecting edema. Tiny bilateral pleural effusions suspected. Bones are diffusely demineralized. Telemetry leads overlie the chest. IMPRESSION: Interval progression of diffuse interstitial opacity, likely reflecting edema. Electronically Signed   By: Misty Stanley M.D.   On: 10/14/2022 08:07    Microbiology: Results for orders placed or performed during the hospital encounter of 10/14/22  Resp panel by RT-PCR (RSV, Flu A&B, Covid) Anterior Nasal Swab     Status: None   Collection Time: 10/14/22  8:30 AM   Specimen: Anterior Nasal Swab  Result Value Ref Range Status   SARS Coronavirus 2 by RT PCR NEGATIVE NEGATIVE Final    Comment: (NOTE) SARS-CoV-2 target nucleic acids are NOT DETECTED.  The SARS-CoV-2 RNA is generally detectable in upper respiratory specimens during the acute phase of infection. The  lowest concentration of SARS-CoV-2 viral copies this assay can detect is 138 copies/mL. A negative result does not preclude SARS-Cov-2 infection and should not be used as the sole basis for treatment or other patient management decisions. A negative result may occur with  improper specimen collection/handling, submission of specimen other than nasopharyngeal swab, presence of viral mutation(s) within the areas targeted by this assay, and inadequate number of viral copies(<138 copies/mL). A negative result must be combined with clinical observations, patient history, and epidemiological information. The expected result is Negative.  Fact Sheet for Patients:  EntrepreneurPulse.com.au  Fact Sheet for Healthcare Providers:  IncredibleEmployment.be  This test is no t yet approved or cleared by the [place] FDA and  has been authorized for detection and/or diagnosis of SARS-CoV-2 by FDA under an Emergency Use Authorization (EUA). This EUA will remain  in effect (meaning this test can be used) for the duration of the COVID-19 declaration under Section 564(b)(1) of the  Act, 21 U.S.C.section 360bbb-3(b)(1), unless the authorization is terminated  or revoked sooner.       Influenza A by PCR NEGATIVE NEGATIVE Final   Influenza B by PCR NEGATIVE NEGATIVE Final    Comment: (NOTE) The Xpert Xpress SARS-CoV-2/FLU/RSV plus assay is intended as an aid in the diagnosis of influenza from Nasopharyngeal swab specimens and should not be used as a sole basis for treatment. Nasal washings and aspirates are unacceptable for Xpert Xpress SARS-CoV-2/FLU/RSV testing.  Fact Sheet for Patients: EntrepreneurPulse.com.au  Fact Sheet for Healthcare Providers: IncredibleEmployment.be  This test is not yet approved or cleared by the [place] FDA and has been authorized for detection and/or diagnosis of SARS-CoV-2 by FDA under  an Emergency Use Authorization (EUA). This EUA will remain in effect (meaning this test can be used) for the duration of the COVID-19 declaration under Section 564(b)(1) of the Act, 21 U.S.C. section 360bbb-3(b)(1), unless the authorization is terminated or revoked.     Resp Syncytial Virus by PCR NEGATIVE NEGATIVE Final    Comment: (NOTE) Fact Sheet for Patients: EntrepreneurPulse.com.au  Fact Sheet for Healthcare Providers: IncredibleEmployment.be  This test is not yet approved or cleared by the [place] FDA and has been authorized for detection and/or diagnosis of SARS-CoV-2 by FDA under an Emergency Use Authorization (EUA). This EUA will remain in effect (meaning this test can be used) for the duration of the COVID-19 declaration under Section 564(b)(1) of the Act, 21 U.S.C. section 360bbb-3(b)(1), unless the authorization is terminated or revoked.  Performed at [facility], [address] [place], [place] [address]     Labs: CBC: Recent Labs  Lab 10/14/22 0751 10/15/22 0442  WBC 12.4* 5.4  HGB 10.1* 9.3*  HCT 31.1* 28.2*  MCV 92.6 92.5  PLT 212 0000000   Basic Metabolic Panel: Recent Labs  Lab 10/14/22 0751 10/15/22 0442  NA 137 135  K 4.2 4.7  CL 98 95*  CO2 23 23  GLUCOSE 101* 96  BUN 65* 90*  CREATININE 6.01* 7.67*  CALCIUM 8.6* 8.0*  MG  --  2.2   Liver Function Tests: Recent Labs  Lab 10/14/22 0751 10/15/22 0442  AST 34 31  ALT 17 19  ALKPHOS 80 72  BILITOT 1.1 1.0  PROT 7.0 6.2*  ALBUMIN 3.2* 2.9*   CBG: No results for input(s): "GLUCAP" in the last 168 hours.  Discharge time spent: greater than 40 minutes.  Signed: Deatra James, MD [facility] 10/15/2022

## 2022-10-15 NOTE — TOC Transition Note (Signed)
Transition of Care [facility]) - CM/SW Discharge Note   Patient Details  Name: Felicia Williams MRN: [id number] Date of Birth: 01/29/1941  Transition of Care [facility]) CM/SW Contact:  Iona Beard, [place] Phone Number: 10/15/2022, 12:29 PM   Clinical Narrative:    Pt is high risk for readmission. CSW spoke with pt to complete assessment. Pt states that her granddaughter lives with her. Pt is independent in completing her ADLs. Pt is able to drive to appointments as needed. Pt has had HH in the past but feels this is not needed. Pt has DME but only uses if needed. Pt goes to [facility] for outpatient HD. TOC signing off.   Final next level of care: Home/Self Care Barriers to Discharge: Barriers Resolved   Patient Goals and CMS Choice CMS Medicare.gov Compare Post Acute Care list provided to:: Patient Choice offered to / list presented to : Patient  Discharge Placement                         Discharge Plan and Services Additional resources added to the After Visit Summary for                                       Social Determinants of Health (SDOH) Interventions SDOH Screenings   Food Insecurity: Food Insecurity Present (10/14/2022)  Housing: Low Risk  (10/14/2022)  Transportation Needs: No Transportation Needs (10/14/2022)  Utilities: Not At Risk (10/14/2022)  Tobacco Use: Low Risk  (10/14/2022)     Readmission Risk Interventions    10/15/2022   12:28 PM  Readmission Risk Prevention Plan  Transportation Screening Complete  PCP or Specialist Appt within 3-5 Days Complete  HRI or [place] Complete  Social Work Consult for [place] Planning/Counseling Complete  Palliative Care Screening Not Applicable  Medication Review Press photographer) Complete

## 2022-10-15 NOTE — Procedures (Signed)
Hemodialysis Note:  Patient tolerated 3.5 hour treatment today with stable vital signs. Removed 318ml net fluid. BVP 81.9 liters. Experienced mild to moderate cramping in right hand and upper abdomen with 15 minutes left in treatment. 150ml NS bolus given. Cramping was relieved. Asymptomatic post treatment. Hand off given to primary RN. Returned to patient's room by this RN.

## 2022-10-15 NOTE — Care Management Obs Status (Signed)
[place] NOTIFICATION   Patient Details  Name: Felicia Williams MRN: [id number] Date of Birth: December 23, 1940   Medicare Observation Status Notification Given:  Yes    Iona Beard, [place] 10/15/2022, 1:39 PM

## 2022-10-15 NOTE — Care Management CC44 (Signed)
Condition Code 44 Documentation Completed  Patient Details  Name: Felicia Williams MRN: [id number] Date of Birth: October 11, 1940   Condition Code 44 given:  Yes Patient signature on Condition Code 44 notice:  Yes Documentation of 2 MD's agreement:  Yes Code 44 added to claim:  Yes    Iona Beard, [place] 10/15/2022, 1:39 PM

## 2022-10-15 NOTE — Progress Notes (Signed)
Patient transported off floor for dialysis.

## 2022-10-15 NOTE — Consult Note (Signed)
Felicia Williams Admit Date: 10/14/2022 10/15/2022 Rexene Agent Requesting Physician:  Manuella Ghazi DO  Reason for Consult:  ESRD, N/V, SOB HPI:  89F ESRD MWF [name] admitted 3/24 with nausea vomiting, abdominal pain, dyspnea.  Other history includes chronic diarrhea, chronic oxygen dependence, hypertension, GERD, osteoporosis.  In ED workup CT abdomen with small ascites but no other significant findings.  Portable chest x-ray with findings consistent with pulmonary edema.  COVID, influenza, RSV negative.  LFTs were unremarkable.  Lipase was normal.  This morning the patient states that she still has abdominal discomfort, before it was bandlike across the mid abdomen; now it is epigastric into the back..  She is tolerating breakfast.  Diarrhea is chronic and unchanged.  Dyspnea is unchanged.  She is on 1 L nasal cannula.  Hemoglobin is 9.3, fairly stable.  K4.7, HCO3 23, BUN 90.  She states her last dialysis was on 3/22. It was uneventful.  She uses a left upper arm AV fistula.  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 Laural Golden.   GERD (gastroesophageal reflux disease)    Glomerulonephritis    Gout    Mixed hyperlipidemia    Spondylosis    PSH  Past Surgical History:  Procedure Laterality Date   APPENDECTOMY     AV FISTULA PLACEMENT Left 03/20/2022   Procedure: INSERTION OF LEFT UPPER ARM ARTERIOVENOUS (AV) GORE-TEX GRAFT;  Surgeon: Rosetta Posner, MD;  Location: [facility];  Service: Vascular;  Laterality: Left;   CATARACT EXTRACTION     COLONOSCOPY N/A 12/28/2015    Procedure: COLONOSCOPY;  Surgeon: Rogene Houston, MD;  Location: [facility];  Service: Endoscopy;  Laterality: N/A;  815   ESOPHAGOGASTRODUODENOSCOPY N/A 11/16/2020   Procedure: ESOPHAGOGASTRODUODENOSCOPY (EGD);  Surgeon: Rogene Houston, MD;  [facility];  Service: Endoscopy;  Laterality: N/A;  1:15   EXCHANGE OF A DIALYSIS CATHETER N/A 03/20/2022   Procedure: EXCHANGE OF A DIALYSIS CATHETER;  Surgeon: Rosetta Posner, MD;  Location: [facility];  Service: Vascular;  Laterality: N/A;   INSERTION OF DIALYSIS CATHETER Right 09/30/2021   Procedure: INSERTION OF TUNNELED DIALYSIS CATHETER;  Surgeon: Waynetta Sandy, MD;  Location: [place];  Service: Vascular;  Laterality: Right;   IR FLUORO GUIDE CV LINE RIGHT  01/16/2019   IR REMOVAL TUN CV CATH W/O FL  07/01/2019   IR US GUIDE VASC ACCESS RIGHT  01/16/2019   POLYPECTOMY  11/16/2020   Procedure: POLYPECTOMY;  Surgeon: Rogene Houston, MD;  [facility];  Service: Endoscopy;;  gastric   REMOVAL OF A DIALYSIS CATHETER  09/30/2021   Procedure: REMOVAL OF A PERITONEAL DIALYSIS CATHETER;  Surgeon: Waynetta Sandy, MD;  Location: [place];  Service: Vascular;;   REMOVAL OF A DIALYSIS CATHETER N/A 05/22/2022   Procedure: MINOR REMOVAL OF A TUNNELED DIALYSIS CATHETER;  Surgeon: Rosetta Posner, MD;  Location: [facility];  Service: Vascular;  Laterality: N/A;   TUBAL LIGATION  FH  Family History  Problem Relation Age of Onset   CAD Father    Heart attack Father    Diabetes Mellitus II Father    Hypertension Father    Lupus Brother    SH  reports that she has never smoked. She has never been exposed to tobacco smoke. She has never used smokeless tobacco. She reports that she does not drink alcohol and does not use drugs. Allergies  Allergies  Allergen Reactions   Cefepime Other (See Comments)    Feb 2023 Encephalopathy with questionable seizures. Seems to be tolerating cefazolin    Morphine Other (See Comments)    "Dry  heaving like crazy"   Sulfa Antibiotics Other (See Comments)    Shut pt's kidneys down   Home medications Prior to Admission medications   Medication Sig Start Date End Date Taking? Authorizing Provider  acetaminophen (TYLENOL) 325 MG tablet Take 2 tablets (650 mg total) by mouth every 6 (six) hours as needed for mild pain. Patient taking differently: Take 325 mg by mouth every 6 (six) hours as needed for mild pain. 09/12/21  Yes Love, Ivan Anchors, PA-C  camphor-menthol [facility]) lotion Apply topically as needed for itching. Patient taking differently: Apply 1 Application topically as needed for itching. 09/12/21  Yes Love, Ivan Anchors, PA-C  carvedilol (COREG) 12.5 MG tablet Take 1 tablet by mouth 2 (two) times daily.   Yes [provider]  famotidine (PEPCID) 40 MG tablet Take 1 tablet (40 mg total) by mouth at bedtime. Patient taking differently: Take 40 mg by mouth at bedtime as needed for heartburn. 11/28/21  Yes Rehman, Mechele Dawley, MD  hydrALAZINE (APRESOLINE) 50 MG tablet Take 50 mg by mouth 2 (two) times daily. Do not take morning dose on Dialysis days. Monday,Wednesday and Friday   Yes [provider]  loperamide (IMODIUM) 2 MG capsule Take 1 capsule (2 mg total) by mouth as needed for diarrhea or loose stools. 09/12/21  Yes Love, Ivan Anchors, PA-C  multivitamin (RENA-VIT) TABS tablet Take 1 tablet by mouth daily. One daily   Yes [provider]  pantoprazole (PROTONIX) 40 MG tablet  12/09/17  Yes [provider]  traZODone (DESYREL) 100 MG tablet Take 100 mg by mouth at bedtime. 07/07/22  Yes [provider]  lanthanum (FOSRENOL) 750 MG chewable tablet Chew 750 mg by mouth 3 (three) times daily with meals. Patient not taking: Reported on 10/14/2022    [provider]    Current Medications Scheduled Meds:  alum & mag hydroxide-simeth  30 mL Oral TID   calcium carbonate  1 tablet Oral TID WC   carvedilol  12.5 mg Oral BID   Chlorhexidine  Gluconate Cloth  6 each Topical Q0600   famotidine  40 mg Oral QHS   guaiFENesin-dextromethorphan  5 mL Oral Q8H   heparin  5,000 Units Subcutaneous Q8H   hydrALAZINE  50 mg Oral Irregular times every day   multivitamin  1 tablet Oral Daily   traZODone  100 mg Oral QHS   Continuous Infusions: PRN Meds:.acetaminophen **OR** acetaminophen, albuterol, chlorpheniramine-HYDROcodone, heparin, lidocaine (PF), lidocaine-prilocaine, loperamide, ondansetron **OR** ondansetron (ZOFRAN) IV, pentafluoroprop-tetrafluoroeth  CBC Recent Labs  Lab 10/14/22 0751 10/15/22 0442  WBC 12.4* 5.4  HGB 10.1* 9.3*  HCT 31.1* 28.2*  MCV 92.6 92.5  PLT 212 0000000   Basic Metabolic Panel Recent Labs  Lab 10/14/22 0751 10/15/22 0442  NA 137 135  K 4.2 4.7  CL 98 95*  CO2 23 23  GLUCOSE 101* 96  BUN 65* 90*  CREATININE 6.01* 7.67*  CALCIUM 8.6* 8.0*    Physical Exam  Blood pressure (!) 160/79, pulse 82, temperature 98.4 F (36.9 C), temperature source Oral, resp. rate 20, height 4\' 11"  (1.499 m), weight 44.1 kg, SpO2 96 %. GEN: Elderly female, NAD, sitting up in bed ENT: NCAT  EYES: EOMI CV: Regular, normal S1 and S2, no rub PULM: Clear bilaterally, normal work of breathing ABD: Soft, nontender SKIN: Left upper arm AV fistula with bruit and thrill, no aneurysmal changes. EXT: No peripheral edema  Assessment 89F presenting with acute N/V with a history of recurrent abdominal pain.  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

## 2022-10-16 DIAGNOSIS — R112 Nausea with vomiting, unspecified: Secondary | ICD-10-CM | POA: Diagnosis not present

## 2022-10-16 LAB — HEPATITIS B SURFACE ANTIBODY, QUANTITATIVE: Hep B S AB Quant (Post): <3.1 m[IU]/mL — ABNORMAL LOW (ref >9.9)

## 2022-10-16 NOTE — Discharge Summary (Signed)
Physician Discharge Summary   Patient: Felicia Williams MRN: [id number] DOB: September 19, 1940  Admit date:     10/14/2022  Discharge date: 10/16/22  Discharge Physician: Deatra James   PCP: [name], NP   Patient was discharged yesterday 10/15/2022, at late dialysis, tolerated dialysis well . Later in the evening she did not have a ride to get back home.  Patient was seen and examined this morning, not complaining of abdominal pain, reporting of gas and bowel movements. Still complaining of nonproductive cough, but denies any fever chills.  Patient is cleared to be discharged home  Recommendations at discharge:  -Follow-up with PCP in 1-2 weeks -Follow-up with nephrologist in 1 week, continue scheduled hemodialysis - Caution with narcotics as may increase symptoms of constipation, ileus,   Discharge Diagnoses: Principal Problem:   Nausea and vomiting Active Problems:   ESRD on dialysis [facility])   Essential hypertension, benign   Gastroparesis   Age-related osteoporosis without current pathological fracture   Mixed hyperlipidemia   Overactive bladder   DOE (dyspnea on exertion)   Chronic diarrhea   Acute hypoxemic respiratory failure ([place])  Resolved Problems:   * No resolved hospital problems. *  Felicia Williams is a 82 y.o. female with medical history significant for ESRD on HD MWF, hypertension, GERD, dyslipidemia, chronic diarrhea, gastroparesis, and osteoporosis/spondylosis who presented to the ED with some worsening shortness of breath and mild hypoxemia as well as concerns for intractable nausea and vomiting.  She also claims to have some upper abdominal pain.  Denies any fevers or chills.  She states that she does have chronic diarrhea which has been ongoing for a long time.  She last completed her hemodialysis session this past Friday with no acute events or concerns noted.   ED Course: Vital signs stable and patient afebrile.  Leukocytosis of 12,000 noted and  hemoglobin 10.1.  BUN 65 and creatinine 6.01.  BNP greater than 4500.  Flu and COVID studies negative.  CT abdomen with small ascites, with no other acute abnormalities noted.  Chest x-ray with some mild edema.     Intractable nausea and vomiting -Resolved, tolerating p.o. -Zofran as needed -Follows with Dr. Jenetta Downer outpatient -PRN  Reglan   Dyspnea on exertion with mild hypoxemia -Currently wears oxygen as needed -Flu and COVID studies negative -Chest x-ray with some edema -Follows with cardiology Dr. Dellia Cloud outpatient and has had stress test 07/2022 with no evidence of ischemia -Appears to require adequate optimization of volume through dialysis sessions   ESRD on HD MWF -Nephrology consulted for hemodialysis during inpatient stay -Planning for hemodialysis today 10/15/2022  Hypertension -Continue carvedilol and hydralazine   GERD -PPI   Chronic diarrhea -Being worked up by GI outpatient with celiac disease panel -Imodium as needed        Consultants: Nephrologist Procedures performed: Hemodialysis Disposition: Home Diet recommendation:  Discharge Diet Orders (From admission, onward)     Start     Ordered   10/15/22 0000  Diet - low sodium heart healthy        10/15/22 1214           Cardiac and Carb modified diet DISCHARGE MEDICATION: Allergies as of 10/16/2022       Reactions   Cefepime Other (See Comments)   Feb 2023 Encephalopathy with questionable seizures. Seems to be tolerating cefazolin    Morphine Other (See Comments)   "Dry heaving like crazy"   Sulfa Antibiotics Other (See Comments)   Shut pt's kidneys down  Medication List     STOP taking these medications    lanthanum 750 MG chewable tablet Commonly known as: FOSRENOL       TAKE these medications    acetaminophen 325 MG tablet Commonly known as: TYLENOL Take 2 tablets (650 mg total) by mouth every 6 (six) hours as needed for mild pain. What changed: how much to  take   alum & mag hydroxide-simeth 200-200-20 MG/5ML suspension Commonly known as: MAALOX/MYLANTA Take 30 mLs by mouth 3 (three) times daily.   camphor-menthol lotion Commonly known as: SARNA Apply topically as needed for itching. What changed: how much to take   carvedilol 12.5 MG tablet Commonly known as: COREG Take 1 tablet by mouth 2 (two) times daily.   famotidine 40 MG tablet Commonly known as: Pepcid Take 1 tablet (40 mg total) by mouth at bedtime. What changed:  when to take this reasons to take this   hydrALAZINE 50 MG tablet Commonly known as: APRESOLINE Take 50 mg by mouth 2 (two) times daily. Do not take morning dose on Dialysis days. Monday,Wednesday and Friday   loperamide 2 MG capsule Commonly known as: IMODIUM Take 1 capsule (2 mg total) by mouth as needed for diarrhea or loose stools.   multivitamin Tabs tablet Take 1 tablet by mouth daily. One daily   pantoprazole 40 MG tablet Commonly known as: PROTONIX   senna-docusate 8.6-50 MG tablet Commonly known as: Senokot-S Take 1 tablet by mouth daily for 5 days.   traZODone 100 MG tablet Commonly known as: DESYREL Take 100 mg by mouth at bedtime.               Discharge Care Instructions  (From admission, onward)           Start     Ordered   10/15/22 0000  Discharge wound care:       Comments: Per nursing wound instructions   10/15/22 1214            Discharge Exam: Filed Weights   10/14/22 0729 10/15/22 1735 10/15/22 2254  Weight: 44.1 kg 47.7 kg 44.6 kg         General:  AAO x 3,  cooperative, no distress;   HEENT:  Normocephalic, PERRL, otherwise with in Normal limits   Neuro:  CNII-XII intact. , normal motor and sensation, reflexes intact   Lungs:   Clear to auscultation BL, Respirations unlabored,  No wheezes / crackles  Cardio:    S1/S2, RRR, No murmure, No Rubs or Gallops   Abdomen:  Soft, non-tender, bowel sounds active all four quadrants, no guarding or  peritoneal signs.  Muscular  skeletal:  Limited exam -global generalized weaknesses - in bed, able to move all 4 extremities,   2+ pulses,  symmetric, No pitting edema  Skin:  Dry, warm to touch, negative for any Rashes,  Wounds: Please see nursing documentation  Pressure Injury 04/22/22 Sacrum Right Stage 1 -  Intact skin with non-blanchable redness of a localized area usually over a bony prominence. (Active)  04/22/22 2330  Location: Sacrum  Location Orientation: Right  Staging: Stage 1 -  Intact skin with non-blanchable redness of a localized area usually over a bony prominence.  Wound Description (Comments):   Present on Admission: Yes              Condition at discharge: good  The results of significant diagnostics from this hospitalization (including imaging, microbiology, ancillary and laboratory) are listed below for reference.  Imaging Studies: CT ABDOMEN PELVIS WO CONTRAST  Result Date: 10/14/2022 CLINICAL DATA:  Abdominal pain, acute, nonlocalized, nausea, hypoxia EXAM: CT ABDOMEN AND PELVIS WITHOUT CONTRAST TECHNIQUE: Multidetector CT imaging of the abdomen and pelvis was performed following the standard protocol without IV contrast. RADIATION DOSE REDUCTION: This exam was performed according to the departmental dose-optimization program which includes automated exposure control, adjustment of the mA and/or kV according to patient size and/or use of iterative reconstruction technique. COMPARISON:  07/11/2022 and previous FINDINGS: Lower chest: Persistent moderate pleural effusions. Patchy atelectasis/consolidation in the lung bases. Heart size upper limits normal. No pericardial effusion. Blood pool hypodense compared to the interventricular septum suggesting anemia. Hepatobiliary: No focal liver abnormality is seen. No gallstones, gallbladder wall thickening, or biliary dilatation. 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 No ductal dilatation or regional inflammatory change. Spleen: Normal in size, with few scattered vascular calcifications. Adrenals/Urinary Tract: No adrenal mass. Marked bilateral renal parenchymal atrophy with stable cystic lesion in the left upper pole, as described on previous MR 12/09/2020. No hydronephrosis. Urinary bladder is decompressed. Stomach/Bowel: Stomach decompressed. Small bowel nondilated. Post appendectomy. The colon is partially distended by gas and fecal material, without acute finding. Vascular/Lymphatic: Heavy aortoiliac calcified atheromatous plaque without aneurysm. No abdominal or pelvic adenopathy. Reproductive: Uterus and bilateral adnexa are unremarkable. Other: Trace pelvic and perihepatic ascites.  No free air. Musculoskeletal: Osteitis pubis. Lower lumbar facet DJD allowing grade 1 anterolisthesis L4-5 as before. No fracture or worrisome bone lesion. IMPRESSION: 1. No acute findings. 2. Persistent moderate bilateral pleural effusions. 3. Small amount of abdominal and pelvic ascites. 4. Probable anemia. 5. 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 Current guidelines recommend reimaging every 2 years x2, for a total of 10 years of stability. 6.  Aortic Atherosclerosis (ICD10-I70.0). Electronically Signed   By: Lucrezia Europe M.D.   On: 10/14/2022 08:36   DG Chest Port 1 View  Result Date: 10/14/2022 CLINICAL DATA:  Shortness of breath. EXAM: PORTABLE CHEST 1 VIEW COMPARISON:  04/29/2022 FINDINGS: The cardio pericardial silhouette is enlarged. Diffuse interstitial opacity is progressive in the interval, likely reflecting edema. Tiny bilateral pleural effusions suspected. Bones are diffusely demineralized. Telemetry leads overlie the chest. IMPRESSION: Interval progression of diffuse interstitial opacity, likely reflecting edema. Electronically Signed   By: Misty Stanley M.D.   On: 10/14/2022 08:07    Microbiology: Results for orders placed or performed during the hospital  encounter of 10/14/22  Resp panel by RT-PCR (RSV, Flu A&B, Covid) Anterior Nasal Swab     Status: None   Collection Time: 10/14/22  8:30 AM   Specimen: Anterior Nasal Swab  Result Value Ref Range Status   SARS Coronavirus 2 by RT PCR NEGATIVE NEGATIVE Final    Comment: (NOTE) SARS-CoV-2 target nucleic acids are NOT DETECTED.  The SARS-CoV-2 RNA is generally detectable in upper respiratory specimens during the acute phase of infection. The lowest concentration of SARS-CoV-2 viral copies this assay can detect is 138 copies/mL. A negative result does not preclude SARS-Cov-2 infection and should not be used as the sole basis for treatment or other patient management decisions. A negative result may occur with  improper specimen collection/handling, submission of specimen other than nasopharyngeal swab, presence of viral mutation(s) within the areas targeted by this assay, and inadequate number of viral copies(<138 copies/mL). A negative result must be combined with clinical observations, patient history, and epidemiological information. The expected result is Negative.  Fact Sheet for  Patients:  EntrepreneurPulse.com.au  Fact Sheet for Healthcare Providers:  IncredibleEmployment.be  This test is no t yet approved or cleared by the [place] FDA and  has been authorized for detection and/or diagnosis of SARS-CoV-2 by FDA under an Emergency Use Authorization (EUA). This EUA will remain  in effect (meaning this test can be used) for the duration of the COVID-19 declaration under Section 564(b)(1) of the Act, 21 U.S.C.section 360bbb-3(b)(1), unless the authorization is terminated  or revoked sooner.       Influenza A by PCR NEGATIVE NEGATIVE Final   Influenza B by PCR NEGATIVE NEGATIVE Final    Comment: (NOTE) The Xpert Xpress SARS-CoV-2/FLU/RSV plus assay is intended as an aid in the diagnosis of influenza from Nasopharyngeal swab specimens  and should not be used as a sole basis for treatment. Nasal washings and aspirates are unacceptable for Xpert Xpress SARS-CoV-2/FLU/RSV testing.  Fact Sheet for Patients: EntrepreneurPulse.com.au  Fact Sheet for Healthcare Providers: IncredibleEmployment.be  This test is not yet approved or cleared by the [place] FDA and has been authorized for detection and/or diagnosis of SARS-CoV-2 by FDA under an Emergency Use Authorization (EUA). This EUA will remain in effect (meaning this test can be used) for the duration of the COVID-19 declaration under Section 564(b)(1) of the Act, 21 U.S.C. section 360bbb-3(b)(1), unless the authorization is terminated or revoked.     Resp Syncytial Virus by PCR NEGATIVE NEGATIVE Final    Comment: (NOTE) Fact Sheet for Patients: EntrepreneurPulse.com.au  Fact Sheet for Healthcare Providers: IncredibleEmployment.be  This test is not yet approved or cleared by the [place] FDA and has been authorized for detection and/or diagnosis of SARS-CoV-2 by FDA under an Emergency Use Authorization (EUA). This EUA will remain in effect (meaning this test can be used) for the duration of the COVID-19 declaration under Section 564(b)(1) of the Act, 21 U.S.C. section 360bbb-3(b)(1), unless the authorization is terminated or revoked.  Performed at [facility], [address] [place], [place] [address]     Labs: CBC: Recent Labs  Lab 10/14/22 0751 10/15/22 0442  WBC 12.4* 5.4  HGB 10.1* 9.3*  HCT 31.1* 28.2*  MCV 92.6 92.5  PLT 212 0000000   Basic Metabolic Panel: Recent Labs  Lab 10/14/22 0751 10/15/22 0442  NA 137 135  K 4.2 4.7  CL 98 95*  CO2 23 23  GLUCOSE 101* 96  BUN 65* 90*  CREATININE 6.01* 7.67*  CALCIUM 8.6* 8.0*  MG  --  2.2   Liver Function Tests: Recent Labs  Lab 10/14/22 0751 10/15/22 0442  AST 34 31  ALT 17 19  ALKPHOS 80 72  BILITOT 1.1  1.0  PROT 7.0 6.2*  ALBUMIN 3.2* 2.9*   CBG: No results for input(s): "GLUCAP" in the last 168 hours.  Discharge time spent: greater than 40 minutes.  Signed: Deatra James, MD [facility] 10/16/2022

## 2022-10-16 NOTE — Progress Notes (Signed)
Mobility Specialist Progress Note:    10/16/22 0830  Mobility  Activity Ambulated with assistance in hallway  Level of Assistance Contact guard assist, steadying assist  Assistive Device None  Distance Ambulated (ft) 60 ft  Activity Response Tolerated well  Mobility Referral Yes  $Mobility charge 1 Mobility   Pt agreeable to mobility session. Tolerated well, asx throughout. No c/o dizziness, but had unsteady gait. CGA required for safety. Returned pt to room, all needs met, call bell in reach.   Royetta Crochet Mobility Specialist Please contact via Solicitor or  Rehab office at [phone]

## 2022-10-16 NOTE — Progress Notes (Signed)
Patient returned from dialysis around 2313. She is unable to get a ride home due to the time.

## 2022-10-16 NOTE — Progress Notes (Signed)
Patient discharged with instructions given on medications and follow up appointments,verbalized understanding .Prescriptions sent to Pharmacy of choice documented on AVS.IV discontinued,catheter intact. Accompanied by staff to an awaiting vehicle.

## 2022-10-16 NOTE — Progress Notes (Signed)
Pt seen this AM-  says stomach does not hurt anymore-  3.1 liters removed with HD last night-  tolerated well but still has a productive cough.  Plan is for her to go home today.  If pt still here tomorrow can do HD here but it does seem the plan is for discharge home today   Louis Meckel

## 2022-10-24 ENCOUNTER — Other Ambulatory Visit: Payer: Self-pay

## 2022-10-24 ENCOUNTER — Emergency Department (HOSPITAL_COMMUNITY): Payer: Medicare Other

## 2022-10-24 ENCOUNTER — Encounter (HOSPITAL_COMMUNITY): Payer: Self-pay | Admitting: Emergency Medicine

## 2022-10-24 ENCOUNTER — Observation Stay (HOSPITAL_COMMUNITY)
Admission: EM | Admit: 2022-10-24 | Discharge: 2022-10-25 | Disposition: A | Discharge status: Home / Self Care | Payer: Medicare Other | Attending: Internal Medicine | Admitting: Internal Medicine

## 2022-10-24 DIAGNOSIS — N186 End stage renal disease: Principal | ICD-10-CM | POA: Insufficient documentation

## 2022-10-24 DIAGNOSIS — Z79899 Other long term (current) drug therapy: Secondary | ICD-10-CM | POA: Diagnosis not present

## 2022-10-24 DIAGNOSIS — M545 Low back pain, unspecified: Secondary | ICD-10-CM | POA: Insufficient documentation

## 2022-10-24 DIAGNOSIS — Z1152 Encounter for screening for COVID-19: Secondary | ICD-10-CM | POA: Diagnosis not present

## 2022-10-24 DIAGNOSIS — J9621 Acute and chronic respiratory failure with hypoxia: Secondary | ICD-10-CM

## 2022-10-24 DIAGNOSIS — Z992 Dependence on renal dialysis: Secondary | ICD-10-CM

## 2022-10-24 DIAGNOSIS — R0602 Shortness of breath: Secondary | ICD-10-CM | POA: Diagnosis present

## 2022-10-24 DIAGNOSIS — I132 Hypertensive heart and chronic kidney disease with heart failure and with stage 5 chronic kidney disease, or end stage renal disease: Secondary | ICD-10-CM | POA: Diagnosis not present

## 2022-10-24 DIAGNOSIS — K219 Gastro-esophageal reflux disease without esophagitis: Secondary | ICD-10-CM | POA: Diagnosis present

## 2022-10-24 DIAGNOSIS — I509 Heart failure, unspecified: Secondary | ICD-10-CM | POA: Diagnosis not present

## 2022-10-24 DIAGNOSIS — J9601 Acute respiratory failure with hypoxia: Secondary | ICD-10-CM | POA: Diagnosis not present

## 2022-10-24 DIAGNOSIS — G8929 Other chronic pain: Secondary | ICD-10-CM | POA: Insufficient documentation

## 2022-10-24 DIAGNOSIS — K529 Noninfective gastroenteritis and colitis, unspecified: Secondary | ICD-10-CM | POA: Diagnosis present

## 2022-10-24 DIAGNOSIS — J810 Acute pulmonary edema: Secondary | ICD-10-CM | POA: Diagnosis present

## 2022-10-24 DIAGNOSIS — R197 Diarrhea, unspecified: Secondary | ICD-10-CM | POA: Diagnosis present

## 2022-10-24 DIAGNOSIS — I10 Essential (primary) hypertension: Secondary | ICD-10-CM | POA: Diagnosis present

## 2022-10-24 LAB — RESP PANEL BY RT-PCR (RSV, FLU A&B, COVID)  RVPGX2
Influenza A by PCR: NEGATIVE
Influenza B by PCR: NEGATIVE
Resp Syncytial Virus by PCR: NEGATIVE
SARS Coronavirus 2 by RT PCR: NEGATIVE

## 2022-10-24 LAB — BASIC METABOLIC PANEL
Anion gap: 14 (ref 5–15)
BUN: 63 mg/dL — ABNORMAL HIGH (ref 8–23)
CO2: 23 mmol/L (ref 22–32)
Calcium: 8.1 mg/dL — ABNORMAL LOW (ref 8.9–10.3)
Chloride: 96 mmol/L — ABNORMAL LOW (ref 98–111)
Creatinine, Ser: 5.9 mg/dL — ABNORMAL HIGH (ref 0.44–1.00)
GFR, Estimated: 7 mL/min — ABNORMAL LOW (ref >60)
Glucose, Bld: 96 mg/dL (ref 70–99)
Potassium: 4.4 mmol/L (ref 3.5–5.1)
Sodium: 133 mmol/L — ABNORMAL LOW (ref 135–145)

## 2022-10-24 LAB — HEPATITIS B SURFACE ANTIGEN: Hepatitis B Surface Ag: NONREACTIVE

## 2022-10-24 LAB — CBC
HCT: 32.6 % — ABNORMAL LOW (ref 36.0–46.0)
Hemoglobin: 10.2 g/dL — ABNORMAL LOW (ref 12.0–15.0)
MCH: 30.6 pg (ref 26.0–34.0)
MCHC: 31.3 g/dL (ref 30.0–36.0)
MCV: 97.9 fL (ref 80.0–100.0)
Platelets: 266 10*3/uL (ref 150–400)
RBC: 3.33 MIL/uL — ABNORMAL LOW (ref 3.87–5.11)
RDW: 19.5 % — ABNORMAL HIGH (ref 11.5–15.5)
WBC: 16.3 10*3/uL — ABNORMAL HIGH (ref 4.0–10.5)
nRBC: 0 % (ref 0.0–0.2)

## 2022-10-24 MED ORDER — RENA-VITE PO TABS
1.0000 | ORAL_TABLET | Freq: Every day | ORAL | Status: DC
  Administered 2022-10-25: 1 via ORAL
  Filled 2022-10-24: qty 1

## 2022-10-24 MED ORDER — SODIUM CHLORIDE 0.9 % IV SOLN: 250.0000 mL | INTRAVENOUS | Status: DC | PRN

## 2022-10-24 MED ORDER — IPRATROPIUM-ALBUTEROL 0.5-2.5 (3) MG/3ML IN SOLN: 3.0000 mL | Freq: Four times a day (QID) | RESPIRATORY_TRACT | Status: DC | PRN

## 2022-10-24 MED ORDER — ACETAMINOPHEN 325 MG PO TABS
ORAL_TABLET | ORAL | Status: AC
  Filled 2022-10-24: qty 2

## 2022-10-24 MED ORDER — LIDOCAINE-PRILOCAINE 2.5-2.5 % EX CREA: 1.0000 | TOPICAL_CREAM | CUTANEOUS | Status: DC | PRN

## 2022-10-24 MED ORDER — ALBUTEROL SULFATE HFA 108 (90 BASE) MCG/ACT IN AERS
2.0000 | INHALATION_SPRAY | RESPIRATORY_TRACT | Status: DC | PRN
  Administered 2022-10-24: 2 via RESPIRATORY_TRACT
  Filled 2022-10-24: qty 6.7

## 2022-10-24 MED ORDER — ANTICOAGULANT SODIUM CITRATE 4% (200MG/5ML) IV SOLN: 5.0000 mL | Status: DC | PRN

## 2022-10-24 MED ORDER — LIDOCAINE HCL (PF) 1 % IJ SOLN: 5.0000 mL | INTRAMUSCULAR | Status: DC | PRN

## 2022-10-24 MED ORDER — ACETAMINOPHEN 650 MG RE SUPP: 650.0000 mg | Freq: Four times a day (QID) | RECTAL | Status: DC | PRN

## 2022-10-24 MED ORDER — ACETAMINOPHEN 325 MG PO TABS
650.0000 mg | ORAL_TABLET | Freq: Four times a day (QID) | ORAL | Status: DC | PRN
  Administered 2022-10-24 – 2022-10-25 (×3): 650 mg via ORAL
  Filled 2022-10-24 (×2): qty 2

## 2022-10-24 MED ORDER — TRAZODONE HCL 50 MG PO TABS
100.0000 mg | ORAL_TABLET | Freq: Every day | ORAL | Status: DC
  Administered 2022-10-24: 100 mg via ORAL
  Filled 2022-10-24: qty 2

## 2022-10-24 MED ORDER — CARVEDILOL 12.5 MG PO TABS
12.5000 mg | ORAL_TABLET | Freq: Two times a day (BID) | ORAL | Status: DC
  Administered 2022-10-24 – 2022-10-25 (×2): 12.5 mg via ORAL
  Filled 2022-10-24 (×2): qty 1

## 2022-10-24 MED ORDER — HEPARIN SODIUM (PORCINE) 5000 UNIT/ML IJ SOLN
5000.0000 [IU] | Freq: Three times a day (TID) | INTRAMUSCULAR | Status: DC
  Administered 2022-10-24 – 2022-10-25 (×2): 5000 [IU] via SUBCUTANEOUS
  Filled 2022-10-24 (×2): qty 1

## 2022-10-24 MED ORDER — SODIUM CHLORIDE 0.9% FLUSH: 3.0000 mL | INTRAVENOUS | Status: DC | PRN

## 2022-10-24 MED ORDER — METHOCARBAMOL 500 MG PO TABS
500.0000 mg | ORAL_TABLET | Freq: Three times a day (TID) | ORAL | Status: DC | PRN
  Administered 2022-10-24 – 2022-10-25 (×2): 500 mg via ORAL
  Filled 2022-10-24 (×2): qty 1

## 2022-10-24 MED ORDER — ONDANSETRON HCL 4 MG/2ML IJ SOLN: 4.0000 mg | Freq: Four times a day (QID) | INTRAMUSCULAR | Status: DC | PRN

## 2022-10-24 MED ORDER — ALTEPLASE 2 MG IJ SOLR: 2.0000 mg | Freq: Once | INTRAMUSCULAR | Status: DC | PRN

## 2022-10-24 MED ORDER — HYDRALAZINE HCL 25 MG PO TABS
50.0000 mg | ORAL_TABLET | Freq: Two times a day (BID) | ORAL | Status: DC
  Administered 2022-10-24 – 2022-10-25 (×2): 50 mg via ORAL
  Filled 2022-10-24 (×2): qty 2

## 2022-10-24 MED ORDER — HEPARIN SODIUM (PORCINE) 1000 UNIT/ML DIALYSIS: 20.0000 [IU]/kg | INTRAMUSCULAR | Status: DC | PRN

## 2022-10-24 MED ORDER — CHLORHEXIDINE GLUCONATE CLOTH 2 % EX PADS
6.0000 | MEDICATED_PAD | Freq: Every day | CUTANEOUS | Status: DC
  Administered 2022-10-25: 6 via TOPICAL

## 2022-10-24 MED ORDER — PENTAFLUOROPROP-TETRAFLUOROETH EX AERO: 1.0000 | INHALATION_SPRAY | CUTANEOUS | Status: DC | PRN

## 2022-10-24 MED ORDER — SODIUM CHLORIDE 0.9% FLUSH
3.0000 mL | Freq: Two times a day (BID) | INTRAVENOUS | Status: DC
  Administered 2022-10-24 – 2022-10-25 (×2): 3 mL via INTRAVENOUS

## 2022-10-24 MED ORDER — HEPARIN SODIUM (PORCINE) 1000 UNIT/ML DIALYSIS: 1000.0000 [IU] | INTRAMUSCULAR | Status: DC | PRN

## 2022-10-24 MED ORDER — ONDANSETRON HCL 4 MG PO TABS: 4.0000 mg | ORAL_TABLET | Freq: Four times a day (QID) | ORAL | Status: DC | PRN

## 2022-10-24 MED ORDER — DICLOFENAC SODIUM 1 % EX GEL
2.0000 g | Freq: Four times a day (QID) | CUTANEOUS | Status: DC
  Administered 2022-10-24 – 2022-10-25 (×2): 2 g via TOPICAL
  Filled 2022-10-24: qty 100

## 2022-10-24 NOTE — ED Notes (Signed)
Report given to [facility]. Pt transported to dialysis.

## 2022-10-24 NOTE — Progress Notes (Signed)
   HEMODIALYSIS TREATMENT NOTE:   Uneventful 3.5 hour heparin-free treatment completed using left upper arm AVG (15g/antegrade). Goal NOT met: Pt was unable to tolerate UF of 3.5 to 4L d/t cramping.  Net UF 3 liters.  All blood was returned and hemostasis was achieved in 20 minutes.  Post-dialysis:  10/24/22 1945  Vital Signs  Temp 97.8 F (36.6 C)  Temp Source Oral  Pulse Rate 77  Pulse Rate Source Monitor  Resp 15  BP (!) 151/62  BP Location Right Arm  BP Method Automatic  Patient Position (if appropriate) Lying  Dialysis Weight  Weight 44.1 kg  Type of Weight Post-Dialysis  Post Treatment  Dialyzer Clearance Lightly streaked  Duration of HD Treatment -hour(s) 3.5 hour(s)  Hemodialysis Intake (mL) 200 mL  Liters Processed 69.1  Fluid Removed (mL) 3000 mL  Tolerated HD Treatment Yes  Post-Hemodialysis Comments Goal met  AVG/AVF Arterial Site Held (minutes) 7 minutes  AVG/AVF Venous Site Held (minutes) 7 minutes  Fistula / Graft Left Upper arm Arteriovenous vein graft  No placement date or time found.   Placed prior to admission: Yes  Orientation: Left  Access Location: Upper arm  Access Type: Arteriovenous vein graft  Fistula / Graft Assessment Thrill;Bruit  Status Patent  Education / Care Plan  Dialysis Education Provided Yes  Documented Education in Care Plan Yes  Outpatient Plan of Care Reviewed and on Chart Yes    Rockwell Alexandria, RN AP [facility]

## 2022-10-24 NOTE — ED Notes (Signed)
Pt states has not had BP meds today. MD made aware. Pt denis any needs. Call bell in reach.

## 2022-10-24 NOTE — ED Triage Notes (Signed)
Pt c/o increased sob over the last week. Pt is on continuous o2 at 2lpm. Pt is supposed to have dialysis today.

## 2022-10-24 NOTE — H&P (Signed)
History and Physical    Felicia Williams [id number] DOB: [birth year] DOA: 10/24/2022  PCP: [name], NP   Patient coming from: Home  Chief Complaint: Shortness of breath  HPI: Felicia Williams is a 82 y.o. female with medical history significant for ESRD on HD MWF, hypertension, GERD, dyslipidemia, chronic diarrhea, gastroparesis, osteoporosis/spondylosis, and recent admission for intractable nausea and vomiting with discharge on 3/25.  She presented for complaints of worsening shortness of breath over the past 1 week despite receiving hemodialysis per her usual sessions.  She missed dialysis today and has been having to use 4 L of oxygen continuously whereas typically she uses oxygen as needed.  She denies any fevers or chills or sore throat.  Denies any chest pain and states she is compliant with her home medications.  She states that she generally stays short of breath despite hemodialysis.   ED Course: Vital signs are stable and patient afebrile.  Leukocytosis of 16,000 noted with hemoglobin 10.2 and sodium 133.  BUN 63 and creatinine 5.9.  Chest x-ray with findings of bilateral pulmonary vascular congestion noted.  She has been given a breathing treatment.  Review of Systems: Reviewed as noted above, otherwise negative.  Past Medical History:  Diagnosis Date   Anemia in ESRD (end-stage renal disease) 08/18/2018   CHF (congestive heart failure)    Coccyx contusion--with chronic pain due to fall    Depression    Dyspnea    ESRD on hemodialysis    Essential hypertension, benign    Gastric polyps    Gastroparesis    followed by Dr. Laural Golden.   GERD (gastroesophageal reflux disease)    Glomerulonephritis    Gout    Mixed hyperlipidemia    Spondylosis     Past Surgical History:  Procedure Laterality Date   APPENDECTOMY     AV FISTULA PLACEMENT Left 03/20/2022   Procedure: INSERTION OF LEFT UPPER ARM ARTERIOVENOUS (AV) GORE-TEX GRAFT;  Surgeon: Rosetta Posner, MD;   Location: [facility];  Service: Vascular;  Laterality: Left;   CATARACT EXTRACTION     COLONOSCOPY N/A 12/28/2015   Procedure: COLONOSCOPY;  Surgeon: Rogene Houston, MD;  Location: [facility];  Service: Endoscopy;  Laterality: N/A;  815   ESOPHAGOGASTRODUODENOSCOPY N/A 11/16/2020   Procedure: ESOPHAGOGASTRODUODENOSCOPY (EGD);  Surgeon: Rogene Houston, MD;  Location: [facility];  Service: Endoscopy;  Laterality: N/A;  1:15   EXCHANGE OF A DIALYSIS CATHETER N/A 03/20/2022   Procedure: EXCHANGE OF A DIALYSIS CATHETER;  Surgeon: Rosetta Posner, MD;  Location: [facility];  Service: Vascular;  Laterality: N/A;   INSERTION OF DIALYSIS CATHETER Right 09/30/2021   Procedure: INSERTION OF TUNNELED DIALYSIS CATHETER;  Surgeon: Waynetta Sandy, MD;  Location: [place];  Service: Vascular;  Laterality: Right;   IR FLUORO GUIDE CV LINE RIGHT  01/16/2019   IR REMOVAL TUN CV CATH W/O FL  07/01/2019   IR US GUIDE VASC ACCESS RIGHT  01/16/2019   POLYPECTOMY  11/16/2020   Procedure: POLYPECTOMY;  Surgeon: Rogene Houston, MD;  Location: [facility];  Service: Endoscopy;;  gastric   REMOVAL OF A DIALYSIS CATHETER  09/30/2021   Procedure: REMOVAL OF A PERITONEAL DIALYSIS CATHETER;  Surgeon: Waynetta Sandy, MD;  Location: [place];  Service: Vascular;;   REMOVAL OF A DIALYSIS CATHETER N/A 05/22/2022   Procedure: MINOR REMOVAL OF A TUNNELED DIALYSIS CATHETER;  Surgeon: Rosetta Posner, MD;  Location: [facility];  Service: Vascular;  Laterality: N/A;  TUBAL LIGATION       reports that she has never smoked. She has never been exposed to tobacco smoke. She has never used smokeless tobacco. She reports that she does not drink alcohol and does not use drugs.  Allergies  Allergen Reactions   Cefepime Other (See Comments)    Feb 2023 Encephalopathy with questionable seizures. Seems to be tolerating cefazolin    Morphine Other (See Comments)    "Dry heaving like crazy"   Sulfa Antibiotics Other (See Comments)     Shut pt's kidneys down    Family History  Problem Relation Age of Onset   CAD Father    Heart attack Father    Diabetes Mellitus II Father    Hypertension Father    Lupus Brother     Prior to Admission medications   Medication Sig Start Date End Date Taking? Authorizing Provider  acetaminophen (TYLENOL) 325 MG tablet Take 2 tablets (650 mg total) by mouth every 6 (six) hours as needed for mild pain. Patient taking differently: Take 325 mg by mouth every 6 (six) hours as needed for mild pain. 09/12/21   Love, Ivan Anchors, PA-C  alum & mag hydroxide-simeth (MAALOX/MYLANTA) 200-200-20 MG/5ML suspension Take 30 mLs by mouth 3 (three) times daily. 10/15/22   Deatra James, MD  camphor-menthol Timoteo Ace) lotion Apply topically as needed for itching. Patient taking differently: Apply 1 Application topically as needed for itching. 09/12/21   Love, Ivan Anchors, PA-C  carvedilol (COREG) 12.5 MG tablet Take 1 tablet by mouth 2 (two) times daily.    [provider]  famotidine (PEPCID) 40 MG tablet Take 1 tablet (40 mg total) by mouth at bedtime. Patient taking differently: Take 40 mg by mouth at bedtime as needed for heartburn. 11/28/21   Rogene Houston, MD  hydrALAZINE (APRESOLINE) 50 MG tablet Take 50 mg by mouth 2 (two) times daily. Do not take morning dose on Dialysis days. Monday,Wednesday and Friday    [provider]  loperamide (IMODIUM) 2 MG capsule Take 1 capsule (2 mg total) by mouth as needed for diarrhea or loose stools. 09/12/21   Love, Ivan Anchors, PA-C  multivitamin (RENA-VIT) TABS tablet Take 1 tablet by mouth daily. One daily    [provider]  pantoprazole (PROTONIX) 40 MG tablet  12/09/17   [provider]  traZODone (DESYREL) 100 MG tablet Take 100 mg by mouth at bedtime. 07/07/22   [provider]    Physical Exam: Vitals:   10/24/22 1137 10/24/22 1139 10/24/22 1230 10/24/22 1345  BP:  (!) 182/98 (!) 186/80 (!) 181/78  Pulse:  86 83 81   Resp:  (!) 22 (!) 28 (!) 26  Temp:  98.2 F (36.8 C)    TempSrc:  Oral    SpO2:  99% 100% 97%  Weight: 44.6 kg     Height: 4\' 11"  (1.499 m)       Constitutional: NAD, calm, comfortable Vitals:   10/24/22 1137 10/24/22 1139 10/24/22 1230 10/24/22 1345  BP:  (!) 182/98 (!) 186/80 (!) 181/78  Pulse:  86 83 81  Resp:  (!) 22 (!) 28 (!) 26  Temp:  98.2 F (36.8 C)    TempSrc:  Oral    SpO2:  99% 100% 97%  Weight: 44.6 kg     Height: 4\' 11"  (1.499 m)      Eyes: lids and conjunctivae normal Neck: normal, supple Respiratory: clear to auscultation bilaterally. Normal respiratory effort. No accessory muscle use.  2 L nasal cannula Cardiovascular: Regular rate and rhythm, no murmurs. Abdomen: no tenderness, no distention. Bowel sounds positive.  Musculoskeletal:  No edema. Skin: no rashes, lesions, ulcers.  Psychiatric: Flat affect  Labs on Admission: I have personally reviewed following labs and imaging studies  CBC: Recent Labs  Lab 10/24/22 1239  WBC 16.3*  HGB 10.2*  HCT 32.6*  MCV 97.9  PLT 123456   Basic Metabolic Panel: Recent Labs  Lab 10/24/22 1239  NA 133*  K 4.4  CL 96*  CO2 23  GLUCOSE 96  BUN 63*  CREATININE 5.90*  CALCIUM 8.1*   GFR: Estimated Creatinine Clearance: 5 mL/min (A) (by C-G formula based on SCr of 5.9 mg/dL (H)). Liver Function Tests: No results for input(s): "AST", "ALT", "ALKPHOS", "BILITOT", "PROT", "ALBUMIN" in the last 168 hours. No results for input(s): "LIPASE", "AMYLASE" in the last 168 hours. No results for input(s): "AMMONIA" in the last 168 hours. Coagulation Profile: No results for input(s): "INR", "PROTIME" in the last 168 hours. Cardiac Enzymes: No results for input(s): "CKTOTAL", "CKMB", "CKMBINDEX", "TROPONINI" in the last 168 hours. BNP (last 3 results) No results for input(s): "PROBNP" in the last 8760 hours. HbA1C: No results for input(s): "HGBA1C" in the last 72 hours. CBG: No results for input(s): "GLUCAP" in  the last 168 hours. Lipid Profile: No results for input(s): "CHOL", "HDL", "LDLCALC", "TRIG", "CHOLHDL", "LDLDIRECT" in the last 72 hours. Thyroid Function Tests: No results for input(s): "TSH", "T4TOTAL", "FREET4", "T3FREE", "THYROIDAB" in the last 72 hours. Anemia Panel: No results for input(s): "VITAMINB12", "FOLATE", "FERRITIN", "TIBC", "IRON", "RETICCTPCT" in the last 72 hours. Urine analysis:    Component Value Date/Time   COLORURINE COLORLESS (A) 08/29/2021 0827   APPEARANCEUR CLEAR 08/29/2021 0827   LABSPEC 1.005 08/29/2021 0827   PHURINE 8.0 08/29/2021 0827   GLUCOSEU >=500 (A) 08/29/2021 0827   HGBUR NEGATIVE 08/29/2021 0827   BILIRUBINUR NEGATIVE 08/29/2021 0827   KETONESUR NEGATIVE 08/29/2021 0827   PROTEINUR 30 (A) 08/29/2021 0827   UROBILINOGEN 0.2 01/11/2014 1635   NITRITE NEGATIVE 08/29/2021 0827   LEUKOCYTESUR NEGATIVE 08/29/2021 0827    Radiological Exams on Admission: DG Chest 2 View  Result Date: 10/24/2022 CLINICAL DATA:  Shortness of breath EXAM: CHEST - 2 VIEW COMPARISON:  10/14/2022 FINDINGS: Stable cardiomediastinal contours. Aortic atherosclerosis. Pulmonary vascular congestion. Diffuse bilateral interstitial opacities, more confluent within the left lung base. Possible trace left pleural effusion. No pneumothorax. IMPRESSION: Pulmonary vascular congestion with diffuse bilateral interstitial opacities, more confluent within the left lung base. Appearance favors pulmonary edema over atypical/viral infection. Electronically Signed   By: Davina Poke D.O.   On: 10/24/2022 11:55    EKG: Independently reviewed. SR 83bpm.  Assessment/Plan Principal Problem:   Acute hypoxemic respiratory failure Active Problems:   ESRD on dialysis   Essential hypertension, benign   Acute pulmonary edema   GERD (gastroesophageal reflux disease)   Chronic diarrhea    Acute on chronic hypoxemic respiratory failure presumably secondary to volume overload -Wears oxygen as  needed -Flu and COVID studies negative   ESRD on HD MWF -Missed hemodialysis today and nephrology to be consulted to assist with hemodialysis while inpatient  Mild hyponatremia -Likely in the setting of volume overload, continue to follow  Hypertension -Continue carvedilol and hydralazine  GERD -PPI  Chronic diarrhea -Imodium as needed -Workup per GI outpatient with celiac disease panel  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 Inpatient status is judged to be reasonable and necessary in order to provide the required intensity of service to ensure the patient's safety. The patient's presenting symptoms, physical exam findings, and initial radiographic and laboratory data in the context of their chronic comorbidities is felt to place them at high risk for further clinical deterioration. Furthermore, it is not anticipated that the patient will be medically stable for discharge from the hospital within 2 midnights of admission.   * I certify that at the point of admission it is my clinical judgment that the patient will require inpatient hospital care spanning beyond 2 midnights from the point of admission due to high intensity of service, high risk for further deterioration and high frequency of surveillance required.*   Felicia Williams D Treston Coker DO [facility]  If 7PM-7AM, please contact night-coverage [email or web address]  10/24/2022, 2:23 PM

## 2022-10-24 NOTE — ED Provider Notes (Signed)
[facility] Provider Note   CSN: [id number] Arrival date & time: 10/24/22  1134     History  Chief Complaint  Patient presents with   Shortness of Breath    Felicia Williams is a 82 y.o. female.  Pt with hx esrd/hd mwf c/o feeling sob this AM. Uses home o2 prn, and today has had to use 4 liters continuously.  Occasional non prod cough. No sore throat. Denies fever/chills. No chest pain or discomfort. No new leg pain or swelling. States went to normal dialysis Monday. Indicates occasionally will feel sob before Monday dialysis if drinks too much over weekend, but in general does not get sob before W or F dialysis. Denies change in meds, and indicates is compliant w meds. Indicates does not make urine at baseline. Non smoker. Denies hx asthma/copd.   The history is provided by the patient, medical records and a relative.  Shortness of Breath Associated symptoms: cough   Associated symptoms: no abdominal pain, no chest pain, no fever, no headaches, no neck pain, no rash, no sore throat and no vomiting        Home Medications Prior to Admission medications   Medication Sig Start Date End Date Taking? Authorizing Provider  acetaminophen (TYLENOL) 325 MG tablet Take 2 tablets (650 mg total) by mouth every 6 (six) hours as needed for mild pain. Patient taking differently: Take 325 mg by mouth every 6 (six) hours as needed for mild pain. 09/12/21   Love, Ivan Anchors, PA-C  alum & mag hydroxide-simeth (MAALOX/MYLANTA) 200-200-20 MG/5ML suspension Take 30 mLs by mouth 3 (three) times daily. 10/15/22   Deatra James, MD  camphor-menthol Timoteo Ace) lotion Apply topically as needed for itching. Patient taking differently: Apply 1 Application topically as needed for itching. 09/12/21   Love, Ivan Anchors, PA-C  carvedilol (COREG) 12.5 MG tablet Take 1 tablet by mouth 2 (two) times daily.    [provider]  famotidine (PEPCID) 40 MG tablet Take 1  tablet (40 mg total) by mouth at bedtime. Patient taking differently: Take 40 mg by mouth at bedtime as needed for heartburn. 11/28/21   Rogene Houston, MD  hydrALAZINE (APRESOLINE) 50 MG tablet Take 50 mg by mouth 2 (two) times daily. Do not take morning dose on Dialysis days. Monday,Wednesday and Friday    [provider]  loperamide (IMODIUM) 2 MG capsule Take 1 capsule (2 mg total) by mouth as needed for diarrhea or loose stools. 09/12/21   Love, Ivan Anchors, PA-C  multivitamin (RENA-VIT) TABS tablet Take 1 tablet by mouth daily. One daily    [provider]  pantoprazole (PROTONIX) 40 MG tablet  12/09/17   [provider]  traZODone (DESYREL) 100 MG tablet Take 100 mg by mouth at bedtime. 07/07/22   [provider]      Allergies    Cefepime, Morphine, and Sulfa antibiotics    Review of Systems   Review of Systems  Constitutional:  Negative for chills and fever.  HENT:  Negative for sore throat.   Eyes:  Negative for redness.  Respiratory:  Positive for cough and shortness of breath.   Cardiovascular:  Negative for chest pain, palpitations and leg swelling.  Gastrointestinal:  Negative for abdominal pain and vomiting.  Genitourinary:  Negative for flank pain.  Musculoskeletal:  Negative for back pain and neck pain.  Skin:  Negative for rash.  Neurological:  Negative for headaches.  Hematological:  Does not bruise/bleed  easily.  Psychiatric/Behavioral:  Negative for confusion.     Physical Exam Updated Vital Signs BP (!) 181/78   Pulse 81   Temp 98.2 F (36.8 C) (Oral)   Resp (!) 26   Ht 1.499 m (4\' 11" )   Wt 44.6 kg   SpO2 97%   BMI 19.86 kg/m  Physical Exam Vitals and nursing note reviewed.  Constitutional:      Appearance: Normal appearance. She is well-developed.  HENT:     Head: Atraumatic.     Nose: Nose normal.     Mouth/Throat:     Mouth: Mucous membranes are moist.  Eyes:     General: No scleral icterus.     Conjunctiva/sclera: Conjunctivae normal.  Neck:     Trachea: No tracheal deviation.  Cardiovascular:     Rate and Rhythm: Normal rate and regular rhythm.     Pulses: Normal pulses.     Heart sounds: Normal heart sounds. No murmur heard.    No friction rub. No gallop.  Pulmonary:     Effort: Pulmonary effort is normal. No respiratory distress.     Breath sounds: Normal breath sounds.  Abdominal:     General: Bowel sounds are normal. There is no distension.     Palpations: Abdomen is soft.     Tenderness: There is no abdominal tenderness.  Genitourinary:    Comments: No cva tenderness.  Musculoskeletal:        General: No swelling or tenderness.     Cervical back: Normal range of motion and neck supple. No rigidity. No muscular tenderness.     Right lower leg: No edema.     Left lower leg: No edema.  Skin:    General: Skin is warm and dry.     Findings: No rash.  Neurological:     Mental Status: She is alert.     Comments: Alert, speech normal.   Psychiatric:        Mood and Affect: Mood normal.     ED Results / Procedures / Treatments   Labs (all labs ordered are listed, but only abnormal results are displayed) Results for orders placed or performed during the hospital encounter of [id number]  Basic metabolic panel  Result Value Ref Range   Sodium 133 (L) 135 - 145 mmol/L   Potassium 4.4 3.5 - 5.1 mmol/L   Chloride 96 (L) 98 - 111 mmol/L   CO2 23 22 - 32 mmol/L   Glucose, Bld 96 70 - 99 mg/dL   BUN 63 (H) 8 - 23 mg/dL   Creatinine, Ser 5.90 (H) 0.44 - 1.00 mg/dL   Calcium 8.1 (L) 8.9 - 10.3 mg/dL   GFR, Estimated 7 (L) >60 mL/min   Anion gap 14 5 - 15  CBC  Result Value Ref Range   WBC 16.3 (H) 4.0 - 10.5 K/uL   RBC 3.33 (L) 3.87 - 5.11 MIL/uL   Hemoglobin 10.2 (L) 12.0 - 15.0 g/dL   HCT 32.6 (L) 36.0 - 46.0 %   MCV 97.9 80.0 - 100.0 fL   MCH 30.6 26.0 - 34.0 pg   MCHC 31.3 30.0 - 36.0 g/dL   RDW 19.5 (H) 11.5 - 15.5 %   Platelets 266 150 - 400 K/uL   nRBC 0.0  0.0 - 0.2 %   DG Chest 2 View  Result Date: 10/24/2022 CLINICAL DATA:  Shortness of breath EXAM: CHEST - 2 VIEW COMPARISON:  10/14/2022 FINDINGS: Stable cardiomediastinal contours. Aortic atherosclerosis. Pulmonary vascular congestion.  Diffuse bilateral interstitial opacities, more confluent within the left lung base. Possible trace left pleural effusion. No pneumothorax. IMPRESSION: Pulmonary vascular congestion with diffuse bilateral interstitial opacities, more confluent within the left lung base. Appearance favors pulmonary edema over atypical/viral infection. Electronically Signed   By: Davina Poke D.O.   On: 10/24/2022 11:55   CT ABDOMEN PELVIS WO CONTRAST  Result Date: 10/14/2022 CLINICAL DATA:  Abdominal pain, acute, nonlocalized, nausea, hypoxia EXAM: CT ABDOMEN AND PELVIS WITHOUT CONTRAST TECHNIQUE: Multidetector CT imaging of the abdomen and pelvis was performed following the standard protocol without IV contrast. RADIATION DOSE REDUCTION: This exam was performed according to the departmental dose-optimization program which includes automated exposure control, adjustment of the mA and/or kV according to patient size and/or use of iterative reconstruction technique. COMPARISON:  07/11/2022 and previous FINDINGS: Lower chest: Persistent moderate pleural effusions. Patchy atelectasis/consolidation in the lung bases. Heart size upper limits normal. No pericardial effusion. Blood pool hypodense compared to the interventricular septum suggesting anemia. Hepatobiliary: No focal liver abnormality is seen. No gallstones, gallbladder wall thickening, or biliary dilatation. 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 No ductal dilatation or regional inflammatory change. Spleen: Normal in size, with few scattered vascular calcifications. Adrenals/Urinary Tract: No adrenal mass. Marked bilateral renal parenchymal atrophy with stable cystic lesion in the  left upper pole, as described on previous MR 12/09/2020. No hydronephrosis. Urinary bladder is decompressed. Stomach/Bowel: Stomach decompressed. Small bowel nondilated. Post appendectomy. The colon is partially distended by gas and fecal material, without acute finding. Vascular/Lymphatic: Heavy aortoiliac calcified atheromatous plaque without aneurysm. No abdominal or pelvic adenopathy. Reproductive: Uterus and bilateral adnexa are unremarkable. Other: Trace pelvic and perihepatic ascites.  No free air. Musculoskeletal: Osteitis pubis. Lower lumbar facet DJD allowing grade 1 anterolisthesis L4-5 as before. No fracture or worrisome bone lesion. IMPRESSION: 1. No acute findings. 2. Persistent moderate bilateral pleural effusions. 3. Small amount of abdominal and pelvic ascites. 4. Probable anemia. 5. 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 Current guidelines recommend reimaging every 2 years x2, for a total of 10 years of stability. 6.  Aortic Atherosclerosis (ICD10-I70.0). Electronically Signed   By: Lucrezia Europe M.D.   On: 10/14/2022 08:36   DG Chest Port 1 View  Result Date: 10/14/2022 CLINICAL DATA:  Shortness of breath. EXAM: PORTABLE CHEST 1 VIEW COMPARISON:  04/29/2022 FINDINGS: The cardio pericardial silhouette is enlarged. Diffuse interstitial opacity is progressive in the interval, likely reflecting edema. Tiny bilateral pleural effusions suspected. Bones are diffusely demineralized. Telemetry leads overlie the chest. IMPRESSION: Interval progression of diffuse interstitial opacity, likely reflecting edema. Electronically Signed   By: Misty Stanley M.D.   On: 10/14/2022 08:07    EKG EKG Interpretation  Date/Time:  Wednesday October 24 2022 12:42:19 EDT Ventricular Rate:  83 PR Interval:  145 QRS Duration: 93 QT Interval:  431 QTC Calculation: 507 R Axis:   1 Text Interpretation: Sinus rhythm Prolonged QT interval Nonspecific T wave abnormality Confirmed by Lajean Saver [phone]) on 10/24/2022  12:53:24 PM  Radiology DG Chest 2 View  Result Date: 10/24/2022 CLINICAL DATA:  Shortness of breath EXAM: CHEST - 2 VIEW COMPARISON:  10/14/2022 FINDINGS: Stable cardiomediastinal contours. Aortic atherosclerosis. Pulmonary vascular congestion. Diffuse bilateral interstitial opacities, more confluent within the left lung base. Possible trace left pleural effusion. No pneumothorax. IMPRESSION: Pulmonary vascular congestion with diffuse bilateral interstitial opacities, more confluent within the left lung base. Appearance favors pulmonary edema over atypical/viral infection. Electronically Signed  By: Davina Poke D.O.   On: 10/24/2022 11:55    Procedures Procedures    Medications Ordered in ED Medications  albuterol (VENTOLIN HFA) 108 (90 Base) MCG/ACT inhaler 2 puff (2 puffs Inhalation Given 10/24/22 1348)    ED Course/ Medical Decision Making/ A&P                             Medical Decision Making Problems Addressed: Acute on chronic respiratory failure with hypoxia: acute illness or injury with systemic symptoms that poses a threat to life or bodily functions ESRD needing dialysis: chronic illness or injury with exacerbation, progression, or side effects of treatment that poses a threat to life or bodily functions Essential hypertension: chronic illness or injury with exacerbation, progression, or side effects of treatment that poses a threat to life or bodily functions SOB (shortness of breath): acute illness or injury with systemic symptoms that poses a threat to life or bodily functions  Amount and/or Complexity of Data Reviewed Independent Historian:     Details: Family, hx External Data Reviewed: labs, radiology and notes. Labs: ordered. Decision-making details documented in ED Course. Radiology: ordered and independent interpretation performed. Decision-making details documented in ED Course. ECG/medicine tests: ordered and independent interpretation performed.  Decision-making details documented in ED Course. Discussion of management or test interpretation with external provider(s): Hospitalists, nephrology.   Risk Prescription drug management. Decision regarding hospitalization.  Iv ns. Continuous pulse ox and cardiac monitoring. Labs ordered/sent. Imaging ordered.   Differential diagnosis includes fluid overload/needing dialysis, chf, pna, etc . Dispo decision including potential need for admission considered - will get labs and imaging and reassess.   Reviewed nursing notes and prior charts for additional history. External reports reviewed. Additional history from: family.   Cardiac monitor: sinus rhythm, rate 83.  Labs reviewed/interpreted by me - k normal.   Xrays reviewed/interpreted by me - vascular congestion.   Given hypoxia, dysnpena, increased o2 requirement, will need dialysis - hospitalists consulted for admission. Nephrology consulted for emergent dialysis.   CRITICAL CARE RE: CHF/vascular congestion/fluid overload/ESRD, acute on chronic resp failure w hypoxia, with increased dyspnea/hypoxia requiring emergent dialysis. Performed by: Mirna Mires Total critical care time: 40 minutes Critical care time was exclusive of separately billable procedures and treating other patients. Critical care was necessary to treat or prevent imminent or life-threatening deterioration. Critical care was time spent personally by me on the following activities: development of treatment plan with patient and/or surrogate as well as nursing, discussions with consultants, evaluation of patient's response to treatment, examination of patient, obtaining history from patient or surrogate, ordering and performing treatments and interventions, ordering and review of laboratory studies, ordering and review of radiographic studies, pulse oximetry and re-evaluation of patient's condition.         Final Clinical Impression(s) / ED Diagnoses Final  diagnoses:  None    Rx / DC Orders ED Discharge Orders     None         Lajean Saver, MD 10/24/22 1417

## 2022-10-24 NOTE — Progress Notes (Signed)
Nephrology brief note: Contacted by ER physician to arrange dialysis.   82 y/o F ESRD on HD at [facility] MWF, recent hospitalization present with SOB and worsening hypoxia. CXR with pulm edema. Labs consistent with ESRD. K 4.4, BUN 63, BNP >4500.   Plan:  Will plan for HD today, UF goal 3.5-4 L AVF for the access.  Patient can be discharged home if clinically better after HD.   D/w dialysis nurse and ER physician.   Katheran James,  CKA

## 2022-10-25 DIAGNOSIS — J9601 Acute respiratory failure with hypoxia: Secondary | ICD-10-CM | POA: Diagnosis present

## 2022-10-25 LAB — HEPATITIS B SURFACE ANTIBODY, QUANTITATIVE: Hep B S AB Quant (Post): <3.5 m[IU]/mL — ABNORMAL LOW (ref >9.9)

## 2022-10-25 MED ORDER — MIDODRINE HCL 10 MG PO TABS: 10.0000 mg | ORAL_TABLET | ORAL | 1 refills | Status: DC

## 2022-10-25 MED ORDER — CHLORHEXIDINE GLUCONATE CLOTH 2 % EX PADS: 6.0000 | MEDICATED_PAD | Freq: Every day | CUTANEOUS | Status: DC

## 2022-10-25 MED ORDER — MIDODRINE HCL 5 MG PO TABS: 10.0000 mg | ORAL_TABLET | ORAL | Status: DC

## 2022-10-25 NOTE — Discharge Summary (Signed)
Physician Discharge Summary  Felicia Williams [id number] DOB: 02/20/1941 DOA: 10/24/2022  PCP: [name], NP  Admit date: 10/24/2022  Discharge date: 10/25/2022  Admitted From:Home  Disposition:  Home  Recommendations for Outpatient Follow-up:  Follow up with PCP in 1-2 weeks Follow-up with nephrology outpatient Instructed to remain compliant with fluid and sodium intake outpatient and she has been educated on this Midodrine 10 mg MWF as prescribed prior to dialysis to assist with further volume removal with dialysis Continue other home medications as prior  Home Health: None  Equipment/Devices: None  Discharge Condition:Stable  CODE STATUS: Full  Diet recommendation: Heart Healthy with fluid and sodium restriction  Brief/Interim Summary:  Felicia Williams is a 82 y.o. female with medical history significant for ESRD on HD MWF, hypertension, GERD, dyslipidemia, chronic diarrhea, gastroparesis, osteoporosis/spondylosis, and recent admission for intractable nausea and vomiting with discharge on 3/25.  She presented for complaints of worsening shortness of breath over the past 1 week despite receiving hemodialysis per her usual sessions.  She is requiring 4 L nasal cannula at home due to her hypoxemia and was admitted with acute hypoxemic respiratory failure in the setting of pulmonary edema.  It appears much of this was secondary to dietary and fluid noncompliance.  She was drinking excessive amounts of fluid and eating salty food to include country ham.  She was educated on watching her diet more carefully and has also been prescribed some midodrine on dialysis days to help facilitate further volume removal as she appears to become hypotensive during her dialysis sessions.  She remains at high risk for readmission if she does not comply with these changes.  No other acute events noted and she is in stable condition for discharge.  Discharge Diagnoses:  Principal Problem:    Acute hypoxemic respiratory failure Active Problems:   ESRD on dialysis   Essential hypertension, benign   Acute pulmonary edema   GERD (gastroesophageal reflux disease)   Chronic diarrhea  Principal discharge diagnosis: Acute hypoxemic respiratory failure secondary to pulmonary edema in the setting of dietary noncompliance.  Discharge Instructions  Discharge Instructions     Diet - low sodium heart healthy   Complete by: As directed    Increase activity slowly   Complete by: As directed    No wound care   Complete by: As directed       Allergies as of 10/25/2022       Reactions   Cefepime Other (See Comments)   Feb 2023 Encephalopathy with questionable seizures. Seems to be tolerating cefazolin    Morphine Other (See Comments)   "Dry heaving like crazy"   Sulfa Antibiotics Other (See Comments)   Shut pt's kidneys down        Medication List     TAKE these medications    acetaminophen 325 MG tablet Commonly known as: TYLENOL Take 2 tablets (650 mg total) by mouth every 6 (six) hours as needed for mild pain.   alum & mag hydroxide-simeth 200-200-20 MG/5ML suspension Commonly known as: MAALOX/MYLANTA Take 30 mLs by mouth 3 (three) times daily.   camphor-menthol lotion Commonly known as: SARNA Apply topically as needed for itching.   carvedilol 12.5 MG tablet Commonly known as: COREG Take 1 tablet by mouth 2 (two) times daily.   famotidine 40 MG tablet Commonly known as: Pepcid Take 1 tablet (40 mg total) by mouth at bedtime.   hydrALAZINE 50 MG tablet Commonly known as: APRESOLINE Take 50 mg by mouth 2 (two) times  daily. Do not take morning dose on Dialysis days. Monday,Wednesday and Friday   loperamide 2 MG capsule Commonly known as: IMODIUM Take 1 capsule (2 mg total) by mouth as needed for diarrhea or loose stools.   midodrine 10 MG tablet Commonly known as: PROAMATINE Take 1 tablet (10 mg total) by mouth every Monday, Wednesday, and  Friday. Start taking on: October 26, 2022   multivitamin Tabs tablet Take 1 tablet by mouth daily. One daily   traZODone 100 MG tablet Commonly known as: DESYREL Take 100 mg by mouth at bedtime.        Follow-up Information     [name], NP. Schedule an appointment as soon as possible for a visit in 1 week(s).   Specialty: Internal Medicine Contact information: [address] [place] [address] [phone]                Allergies  Allergen Reactions   Cefepime Other (See Comments)    Feb 2023 Encephalopathy with questionable seizures. Seems to be tolerating cefazolin    Morphine Other (See Comments)    "Dry heaving like crazy"   Sulfa Antibiotics Other (See Comments)    Shut pt's kidneys down    Consultations: Nephrology   Procedures/Studies: DG Chest 2 View  Result Date: 10/24/2022 CLINICAL DATA:  Shortness of breath EXAM: CHEST - 2 VIEW COMPARISON:  10/14/2022 FINDINGS: Stable cardiomediastinal contours. Aortic atherosclerosis. Pulmonary vascular congestion. Diffuse bilateral interstitial opacities, more confluent within the left lung base. Possible trace left pleural effusion. No pneumothorax. IMPRESSION: Pulmonary vascular congestion with diffuse bilateral interstitial opacities, more confluent within the left lung base. Appearance favors pulmonary edema over atypical/viral infection. Electronically Signed   By: Davina Poke D.O.   On: 10/24/2022 11:55   CT ABDOMEN PELVIS WO CONTRAST  Result Date: 10/14/2022 CLINICAL DATA:  Abdominal pain, acute, nonlocalized, nausea, hypoxia EXAM: CT ABDOMEN AND PELVIS WITHOUT CONTRAST TECHNIQUE: Multidetector CT imaging of the abdomen and pelvis was performed following the standard protocol without IV contrast. RADIATION DOSE REDUCTION: This exam was performed according to the departmental dose-optimization program which includes automated exposure control, adjustment of the mA and/or kV according to patient  size and/or use of iterative reconstruction technique. COMPARISON:  07/11/2022 and previous FINDINGS: Lower chest: Persistent moderate pleural effusions. Patchy atelectasis/consolidation in the lung bases. Heart size upper limits normal. No pericardial effusion. Blood pool hypodense compared to the interventricular septum suggesting anemia. Hepatobiliary: No focal liver abnormality is seen. No gallstones, gallbladder wall thickening, or biliary dilatation. 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 No ductal dilatation or regional inflammatory change. Spleen: Normal in size, with few scattered vascular calcifications. Adrenals/Urinary Tract: No adrenal mass. Marked bilateral renal parenchymal atrophy with stable cystic lesion in the left upper pole, as described on previous MR 12/09/2020. No hydronephrosis. Urinary bladder is decompressed. Stomach/Bowel: Stomach decompressed. Small bowel nondilated. Post appendectomy. The colon is partially distended by gas and fecal material, without acute finding. Vascular/Lymphatic: Heavy aortoiliac calcified atheromatous plaque without aneurysm. No abdominal or pelvic adenopathy. Reproductive: Uterus and bilateral adnexa are unremarkable. Other: Trace pelvic and perihepatic ascites.  No free air. Musculoskeletal: Osteitis pubis. Lower lumbar facet DJD allowing grade 1 anterolisthesis L4-5 as before. No fracture or worrisome bone lesion. IMPRESSION: 1. No acute findings. 2. Persistent moderate bilateral pleural effusions. 3. Small amount of abdominal and pelvic ascites. 4. Probable anemia. 5. 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 Current guidelines  recommend reimaging every 2 years x2, for a total of 10 years of stability. 6.  Aortic Atherosclerosis (ICD10-I70.0). Electronically Signed   By: Lucrezia Europe M.D.   On: 10/14/2022 08:36   DG Chest Port 1 View  Result Date: 10/14/2022 CLINICAL DATA:  Shortness of breath. EXAM:  PORTABLE CHEST 1 VIEW COMPARISON:  04/29/2022 FINDINGS: The cardio pericardial silhouette is enlarged. Diffuse interstitial opacity is progressive in the interval, likely reflecting edema. Tiny bilateral pleural effusions suspected. Bones are diffusely demineralized. Telemetry leads overlie the chest. IMPRESSION: Interval progression of diffuse interstitial opacity, likely reflecting edema. Electronically Signed   By: Misty Stanley M.D.   On: 10/14/2022 08:07     Discharge Exam: Vitals:   10/25/22 0357 10/25/22 1043  BP: (!) 147/62   Pulse: 69   Resp: 18   Temp: 98.3 F (36.8 C)   SpO2: 97% 95%   Vitals:   10/24/22 2009 10/24/22 2351 10/25/22 0357 10/25/22 1043  BP: (!) 169/57 (!) 111/38 (!) 147/62   Pulse: 78 73 69   Resp: 16 19 18    Temp: 97.8 F (36.6 C) 98.5 F (36.9 C) 98.3 F (36.8 C)   TempSrc: Oral     SpO2: 92% 95% 97% 95%  Weight: 44.1 kg     Height: 4\' 11"  (1.499 m)       General: Pt is alert, awake, not in acute distress Cardiovascular: RRR, S1/S2 +, no rubs, no gallops Respiratory: CTA bilaterally, no wheezing, no rhonchi Abdominal: Soft, NT, ND, bowel sounds + Extremities: no edema, no cyanosis    The results of significant diagnostics from this hospitalization (including imaging, microbiology, ancillary and laboratory) are listed below for reference.     Microbiology: Recent Results (from the past 240 hour(s))  Resp panel by RT-PCR (RSV, Flu A&B, Covid) Anterior Nasal Swab     Status: None   Collection Time: 10/24/22  1:34 PM   Specimen: Anterior Nasal Swab  Result Value Ref Range Status   SARS Coronavirus 2 by RT PCR NEGATIVE NEGATIVE Final    Comment: (NOTE) SARS-CoV-2 target nucleic acids are NOT DETECTED.  The SARS-CoV-2 RNA is generally detectable in upper respiratory specimens during the acute phase of infection. The lowest concentration of SARS-CoV-2 viral copies this assay can detect is 138 copies/mL. A negative result does not preclude  SARS-Cov-2 infection and should not be used as the sole basis for treatment or other patient management decisions. A negative result may occur with  improper specimen collection/handling, submission of specimen other than nasopharyngeal swab, presence of viral mutation(s) within the areas targeted by this assay, and inadequate number of viral copies(<138 copies/mL). A negative result must be combined with clinical observations, patient history, and epidemiological information. The expected result is Negative.  Fact Sheet for Patients:  EntrepreneurPulse.com.au  Fact Sheet for Healthcare Providers:  IncredibleEmployment.be  This test is no t yet approved or cleared by the [place] FDA and  has been authorized for detection and/or diagnosis of SARS-CoV-2 by FDA under an Emergency Use Authorization (EUA). This EUA will remain  in effect (meaning this test can be used) for the duration of the COVID-19 declaration under Section 564(b)(1) of the Act, 21 U.S.C.section 360bbb-3(b)(1), unless the authorization is terminated  or revoked sooner.       Influenza A by PCR NEGATIVE NEGATIVE Final   Influenza B by PCR NEGATIVE NEGATIVE Final    Comment: (NOTE) The Xpert Xpress SARS-CoV-2/FLU/RSV plus assay is intended as an aid in the  diagnosis of influenza from Nasopharyngeal swab specimens and should not be used as a sole basis for treatment. Nasal washings and aspirates are unacceptable for Xpert Xpress SARS-CoV-2/FLU/RSV testing.  Fact Sheet for Patients: EntrepreneurPulse.com.au  Fact Sheet for Healthcare Providers: IncredibleEmployment.be  This test is not yet approved or cleared by the [place] FDA and has been authorized for detection and/or diagnosis of SARS-CoV-2 by FDA under an Emergency Use Authorization (EUA). This EUA will remain in effect (meaning this test can be used) for the duration of  the COVID-19 declaration under Section 564(b)(1) of the Act, 21 U.S.C. section 360bbb-3(b)(1), unless the authorization is terminated or revoked.     Resp Syncytial Virus by PCR NEGATIVE NEGATIVE Final    Comment: (NOTE) Fact Sheet for Patients: EntrepreneurPulse.com.au  Fact Sheet for Healthcare Providers: IncredibleEmployment.be  This test is not yet approved or cleared by the [place] FDA and has been authorized for detection and/or diagnosis of SARS-CoV-2 by FDA under an Emergency Use Authorization (EUA). This EUA will remain in effect (meaning this test can be used) for the duration of the COVID-19 declaration under Section 564(b)(1) of the Act, 21 U.S.C. section 360bbb-3(b)(1), unless the authorization is terminated or revoked.  Performed at [facility], [address] [place], [place] [address]      Labs: BNP (last 3 results) Recent Labs    02/28/22 1237 04/22/22 0829 10/14/22 0751  BNP 3,203.1* 4,375.0* [id number]*   Basic Metabolic Panel: Recent Labs  Lab 10/24/22 1239  NA 133*  K 4.4  CL 96*  CO2 23  GLUCOSE 96  BUN 63*  CREATININE 5.90*  CALCIUM 8.1*   Liver Function Tests: No results for input(s): "AST", "ALT", "ALKPHOS", "BILITOT", "PROT", "ALBUMIN" in the last 168 hours. No results for input(s): "LIPASE", "AMYLASE" in the last 168 hours. No results for input(s): "AMMONIA" in the last 168 hours. CBC: Recent Labs  Lab 10/24/22 1239  WBC 16.3*  HGB 10.2*  HCT 32.6*  MCV 97.9  PLT 266   Cardiac Enzymes: No results for input(s): "CKTOTAL", "CKMB", "CKMBINDEX", "TROPONINI" in the last 168 hours. BNP: Invalid input(s): "POCBNP" CBG: No results for input(s): "GLUCAP" in the last 168 hours. D-Dimer No results for input(s): "DDIMER" in the last 72 hours. Hgb A1c No results for input(s): "HGBA1C" in the last 72 hours. Lipid Profile No results for input(s): "CHOL", "HDL", "LDLCALC", "TRIG", "CHOLHDL",  "LDLDIRECT" in the last 72 hours. Thyroid function studies No results for input(s): "TSH", "T4TOTAL", "T3FREE", "THYROIDAB" in the last 72 hours.  Invalid input(s): "FREET3" Anemia work up No results for input(s): "VITAMINB12", "FOLATE", "FERRITIN", "TIBC", "IRON", "RETICCTPCT" in the last 72 hours. Urinalysis    Component Value Date/Time   COLORURINE COLORLESS (A) 08/29/2021 0827   APPEARANCEUR CLEAR 08/29/2021 0827   LABSPEC 1.005 08/29/2021 0827   PHURINE 8.0 08/29/2021 0827   GLUCOSEU >=500 (A) 08/29/2021 0827   HGBUR NEGATIVE 08/29/2021 0827   BILIRUBINUR NEGATIVE 08/29/2021 0827   KETONESUR NEGATIVE 08/29/2021 0827   PROTEINUR 30 (A) 08/29/2021 0827   UROBILINOGEN 0.2 01/11/2014 1635   NITRITE NEGATIVE 08/29/2021 0827   LEUKOCYTESUR NEGATIVE 08/29/2021 0827   Sepsis Labs Recent Labs  Lab 10/24/22 1239  WBC 16.3*   Microbiology Recent Results (from the past 240 hour(s))  Resp panel by RT-PCR (RSV, Flu A&B, Covid) Anterior Nasal Swab     Status: None   Collection Time: 10/24/22  1:34 PM   Specimen: Anterior Nasal Swab  Result Value Ref Range Status  SARS Coronavirus 2 by RT PCR NEGATIVE NEGATIVE Final    Comment: (NOTE) SARS-CoV-2 target nucleic acids are NOT DETECTED.  The SARS-CoV-2 RNA is generally detectable in upper respiratory specimens during the acute phase of infection. The lowest concentration of SARS-CoV-2 viral copies this assay can detect is 138 copies/mL. A negative result does not preclude SARS-Cov-2 infection and should not be used as the sole basis for treatment or other patient management decisions. A negative result may occur with  improper specimen collection/handling, submission of specimen other than nasopharyngeal swab, presence of viral mutation(s) within the areas targeted by this assay, and inadequate number of viral copies(<138 copies/mL). A negative result must be combined with clinical observations, patient history, and  epidemiological information. The expected result is Negative.  Fact Sheet for Patients:  EntrepreneurPulse.com.au  Fact Sheet for Healthcare Providers:  IncredibleEmployment.be  This test is no t yet approved or cleared by the [place] FDA and  has been authorized for detection and/or diagnosis of SARS-CoV-2 by FDA under an Emergency Use Authorization (EUA). This EUA will remain  in effect (meaning this test can be used) for the duration of the COVID-19 declaration under Section 564(b)(1) of the Act, 21 U.S.C.section 360bbb-3(b)(1), unless the authorization is terminated  or revoked sooner.       Influenza A by PCR NEGATIVE NEGATIVE Final   Influenza B by PCR NEGATIVE NEGATIVE Final    Comment: (NOTE) The Xpert Xpress SARS-CoV-2/FLU/RSV plus assay is intended as an aid in the diagnosis of influenza from Nasopharyngeal swab specimens and should not be used as a sole basis for treatment. Nasal washings and aspirates are unacceptable for Xpert Xpress SARS-CoV-2/FLU/RSV testing.  Fact Sheet for Patients: EntrepreneurPulse.com.au  Fact Sheet for Healthcare Providers: IncredibleEmployment.be  This test is not yet approved or cleared by the [place] FDA and has been authorized for detection and/or diagnosis of SARS-CoV-2 by FDA under an Emergency Use Authorization (EUA). This EUA will remain in effect (meaning this test can be used) for the duration of the COVID-19 declaration under Section 564(b)(1) of the Act, 21 U.S.C. section 360bbb-3(b)(1), unless the authorization is terminated or revoked.     Resp Syncytial Virus by PCR NEGATIVE NEGATIVE Final    Comment: (NOTE) Fact Sheet for Patients: EntrepreneurPulse.com.au  Fact Sheet for Healthcare Providers: IncredibleEmployment.be  This test is not yet approved or cleared by the [place] FDA and has been  authorized for detection and/or diagnosis of SARS-CoV-2 by FDA under an Emergency Use Authorization (EUA). This EUA will remain in effect (meaning this test can be used) for the duration of the COVID-19 declaration under Section 564(b)(1) of the Act, 21 U.S.C. section 360bbb-3(b)(1), unless the authorization is terminated or revoked.  Performed at [facility], [address] [place], [place] [address]      Time coordinating discharge: 35 minutes  SIGNED:   Rodena Goldmann, DO [facility] 10/25/2022, 11:50 AM  If 7PM-7AM, please contact night-coverage [email or web address]

## 2022-10-25 NOTE — Consult Note (Addendum)
[facility] Nephrology Consult Note: Reason for Consult: To manage dialysis and dialysis related needs Referring Physician: Dr. Manuella Ghazi, Pratik  HPI:  Felicia Williams is an 82 y.o. female with past medical history significant for hypertension, GERD, dyslipidemia, gastroparesis, ESRD on HD MWF at [facility] presented with shortness of breath, seen as a consultation for the management of ESRD. It seems like she was recently admitted for similar condition which was improved with dialysis in the hospital.  She now presented with worsening shortness of breath and not feeling well.  No fever, chills, nausea, vomiting, chest pain.  In the ER she was found to have bilateral pulmonary vascular congestion/edema and in respite distress.  She required breathing treatment and received dialysis yesterday with 3 L ultrafiltration.  She is currently feeling better, back to her baseline.  She is in room air and able to lie flat.  Today I had a discussion with her and her daughter at the bedside.  The patient does not have insight into salt and fluid restriction.  Apparently she was eating barbecue with extra salt and drinking fluid throughout the day.  There was also received with intradialytic hypotension at outpatient unit causing limited ultrafiltration.  It seems like she is not on midodrine pre-HD.  The labs showed sodium 133, potassium 4.4, WBC 16.3, hemoglobin 10.2.  Past Medical History:  Diagnosis Date   Anemia in ESRD (end-stage renal disease) 08/18/2018   CHF (congestive heart failure)    Coccyx contusion--with chronic pain due to fall    Depression    Dyspnea    ESRD on hemodialysis    Essential hypertension, benign    Gastric polyps    Gastroparesis    followed by Dr. Laural Golden.   GERD (gastroesophageal reflux disease)    Glomerulonephritis    Gout    Mixed hyperlipidemia    Spondylosis     Past Surgical History:  Procedure Laterality Date   APPENDECTOMY     AV FISTULA  PLACEMENT Left 03/20/2022   Procedure: INSERTION OF LEFT UPPER ARM ARTERIOVENOUS (AV) GORE-TEX GRAFT;  Surgeon: Rosetta Posner, MD;  Location: [facility];  Service: Vascular;  Laterality: Left;   CATARACT EXTRACTION     COLONOSCOPY N/A 12/28/2015   Procedure: COLONOSCOPY;  Surgeon: Rogene Houston, MD;  Location: [facility];  Service: Endoscopy;  Laterality: N/A;  815   ESOPHAGOGASTRODUODENOSCOPY N/A 11/16/2020   Procedure: ESOPHAGOGASTRODUODENOSCOPY (EGD);  Surgeon: Rogene Houston, MD;  Location: [facility];  Service: Endoscopy;  Laterality: N/A;  1:15   EXCHANGE OF A DIALYSIS CATHETER N/A 03/20/2022   Procedure: EXCHANGE OF A DIALYSIS CATHETER;  Surgeon: Rosetta Posner, MD;  Location: [facility];  Service: Vascular;  Laterality: N/A;   INSERTION OF DIALYSIS CATHETER Right 09/30/2021   Procedure: INSERTION OF TUNNELED DIALYSIS CATHETER;  Surgeon: Waynetta Sandy, MD;  Location: [place];  Service: Vascular;  Laterality: Right;   IR FLUORO GUIDE CV LINE RIGHT  01/16/2019   IR REMOVAL TUN CV CATH W/O FL  07/01/2019   IR US GUIDE VASC ACCESS RIGHT  01/16/2019   POLYPECTOMY  11/16/2020   Procedure: POLYPECTOMY;  Surgeon: Rogene Houston, MD;  Location[facility];  Service: Endoscopy;;  gastric   REMOVAL OF A DIALYSIS CATHETER  09/30/2021   Procedure: REMOVAL OF A PERITONEAL DIALYSIS CATHETER;  Surgeon: Waynetta Sandy, MD;  Location: [place];  Service: Vascular;;   REMOVAL OF A DIALYSIS CATHETER N/A 05/22/2022   Procedure: MINOR  REMOVAL OF A TUNNELED DIALYSIS CATHETER;  Surgeon: Rosetta Posner, MD;  Location: [facility];  Service: Vascular;  Laterality: N/A;   TUBAL LIGATION      Family History  Problem Relation Age of Onset   CAD Father    Heart attack Father    Diabetes Mellitus II Father    Hypertension Father    Lupus Brother     Social History:  reports that she has never smoked. She has never been exposed to tobacco smoke. She has never used smokeless tobacco. She reports that  she does not drink alcohol and does not use drugs.  Allergies:  Allergies  Allergen Reactions   Cefepime Other (See Comments)    Feb 2023 Encephalopathy with questionable seizures. Seems to be tolerating cefazolin    Morphine Other (See Comments)    "Dry heaving like crazy"   Sulfa Antibiotics Other (See Comments)    Shut pt's kidneys down    Medications: I have reviewed the patient's current medications.   Results for orders placed or performed during the hospital encounter of 10/24/22 (from the past 48 hour(s))  Basic metabolic panel     Status: Abnormal   Collection Time: 10/24/22 12:39 PM  Result Value Ref Range   Sodium 133 (L) 135 - 145 mmol/L   Potassium 4.4 3.5 - 5.1 mmol/L    Comment: HEMOLYSIS AT THIS LEVEL MAY AFFECT RESULT HEMOLYSIS AT THIS LEVEL MAY AFFECT RESULT    Chloride 96 (L) 98 - 111 mmol/L   CO2 23 22 - 32 mmol/L   Glucose, Bld 96 70 - 99 mg/dL    Comment: Glucose reference range applies only to samples taken after fasting for at least 8 hours.   BUN 63 (H) 8 - 23 mg/dL   Creatinine, Ser 5.90 (H) 0.44 - 1.00 mg/dL   Calcium 8.1 (L) 8.9 - 10.3 mg/dL   GFR, Estimated 7 (L) >60 mL/min    Comment: (NOTE) Calculated using the CKD-EPI Creatinine Equation (2021)    Anion gap 14 5 - 15    Comment: Performed at [facility], [address], [place], [place] [address]  CBC     Status: Abnormal   Collection Time: 10/24/22 12:39 PM  Result Value Ref Range   WBC 16.3 (H) 4.0 - 10.5 K/uL   RBC 3.33 (L) 3.87 - 5.11 MIL/uL   Hemoglobin 10.2 (L) 12.0 - 15.0 g/dL   HCT 32.6 (L) 36.0 - 46.0 %   MCV 97.9 80.0 - 100.0 fL   MCH 30.6 26.0 - 34.0 pg   MCHC 31.3 30.0 - 36.0 g/dL   RDW 19.5 (H) 11.5 - 15.5 %   Platelets 266 150 - 400 K/uL   nRBC 0.0 0.0 - 0.2 %    Comment: Performed at [facility], [address], [place], [place] [address]  Resp panel by RT-PCR (RSV, Flu A&B, Covid) Anterior Nasal Swab     Status: None   Collection Time: 10/24/22  1:34 PM   Specimen:  Anterior Nasal Swab  Result Value Ref Range   SARS Coronavirus 2 by RT PCR NEGATIVE NEGATIVE    Comment: (NOTE) SARS-CoV-2 target nucleic acids are NOT DETECTED.  The SARS-CoV-2 RNA is generally detectable in upper respiratory specimens during the acute phase of infection. The lowest concentration of SARS-CoV-2 viral copies this assay can detect is 138 copies/mL. A negative result does not preclude SARS-Cov-2 infection and should not be used as the sole basis for treatment or other patient  management decisions. A negative result may occur with  improper specimen collection/handling, submission of specimen other than nasopharyngeal swab, presence of viral mutation(s) within the areas targeted by this assay, and inadequate number of viral copies(<138 copies/mL). A negative result must be combined with clinical observations, patient history, and epidemiological information. The expected result is Negative.  Fact Sheet for Patients:  EntrepreneurPulse.com.au  Fact Sheet for Healthcare Providers:  IncredibleEmployment.be  This test is no t yet approved or cleared by the [place] FDA and  has been authorized for detection and/or diagnosis of SARS-CoV-2 by FDA under an Emergency Use Authorization (EUA). This EUA will remain  in effect (meaning this test can be used) for the duration of the COVID-19 declaration under Section 564(b)(1) of the Act, 21 U.S.C.section 360bbb-3(b)(1), unless the authorization is terminated  or revoked sooner.       Influenza A by PCR NEGATIVE NEGATIVE   Influenza B by PCR NEGATIVE NEGATIVE    Comment: (NOTE) The Xpert Xpress SARS-CoV-2/FLU/RSV plus assay is intended as an aid in the diagnosis of influenza from Nasopharyngeal swab specimens and should not be used as a sole basis for treatment. Nasal washings and aspirates are unacceptable for Xpert Xpress SARS-CoV-2/FLU/RSV testing.  Fact Sheet for  Patients: EntrepreneurPulse.com.au  Fact Sheet for Healthcare Providers: IncredibleEmployment.be  This test is not yet approved or cleared by the [place] FDA and has been authorized for detection and/or diagnosis of SARS-CoV-2 by FDA under an Emergency Use Authorization (EUA). This EUA will remain in effect (meaning this test can be used) for the duration of the COVID-19 declaration under Section 564(b)(1) of the Act, 21 U.S.C. section 360bbb-3(b)(1), unless the authorization is terminated or revoked.     Resp Syncytial Virus by PCR NEGATIVE NEGATIVE    Comment: (NOTE) Fact Sheet for Patients: EntrepreneurPulse.com.au  Fact Sheet for Healthcare Providers: IncredibleEmployment.be  This test is not yet approved or cleared by the [place] FDA and has been authorized for detection and/or diagnosis of SARS-CoV-2 by FDA under an Emergency Use Authorization (EUA). This EUA will remain in effect (meaning this test can be used) for the duration of the COVID-19 declaration under Section 564(b)(1) of the Act, 21 U.S.C. section 360bbb-3(b)(1), unless the authorization is terminated or revoked.  Performed at [facility], [address] [place], [place] [address]   Hepatitis B surface antigen     Status: None   Collection Time: 10/24/22  2:52 PM  Result Value Ref Range   Hepatitis B Surface Ag NON REACTIVE NON REACTIVE    Comment: Performed at [facility] [address], [place], [place] [address]  Hepatitis B surface antibody,quantitative     Status: Abnormal   Collection Time: 10/24/22  2:52 PM  Result Value Ref Range   Hep B S AB Quant (Post) <3.5 (L) Immunity>9.9 mIU/mL    Comment: (NOTE)  Status of Immunity                     Anti-HBs Level  ------------------                     -------------- Inconsistent with Immunity                   0.0 - 9.9 Consistent with Immunity                           >9.9 Performed At: [facility]   [place], [place] [id number] Rush Farmer MD [id number]     DG Chest 2 View  Result Date: 10/24/2022 CLINICAL DATA:  Shortness of breath EXAM: CHEST - 2 VIEW COMPARISON:  10/14/2022 FINDINGS: Stable cardiomediastinal contours. Aortic atherosclerosis. Pulmonary vascular congestion. Diffuse bilateral interstitial opacities, more confluent within the left lung base. Possible trace left pleural effusion. No pneumothorax. IMPRESSION: Pulmonary vascular congestion with diffuse bilateral interstitial opacities, more confluent within the left lung base. Appearance favors pulmonary edema over atypical/viral infection. Electronically Signed   By: Davina Poke D.O.   On: 10/24/2022 11:55    ROS: As per H&P, rest of the systems reviewed and negative. Blood pressure (!) 147/62, pulse 69, temperature 98.3 F (36.8 C), resp. rate 18, height 4\' 11"  (1.499 m), weight 44.1 kg, SpO2 95 %. Gen: NAD, comfortable Respiratory: Clear bilateral, no wheezing or crackle Cardiovascular: Regular rate rhythm S1-S2 normal, no rubs GI: Abdomen soft, nontender, nondistended Extremities, no cyanosis or clubbing, no edema Skin: No rash or ulcer Neurology: Alert, awake, following commands, oriented Dialysis Access: Left upper extremity AV fistula has a good thrill and bruit.  Assessment/Plan:  # Acute hypoxic respiratory failure/acute pulmonary edema: This is due to fluid overload in the setting of nonadherence with salt and fluid restriction and limited ultrafiltration at OP HD unit.  Today, I had a long discussion with the patient emphasizing about strict salt and fluid restriction After HD, she is clinically improved to her baseline, in room air, able to lie flat and euvolemic.   # ESRD: MWF at [facility].  Status post HD yesterday with 3 L ultrafiltration.  Plan for next HD tomorrow.  This can be done as outpatient. I have discussed with the patient  that she will benefit from midodrine 10 mg pre-HD and may need to lower dialysate temperature to mitigate intradialytic hypotension in order to optimize ultrafiltration.  # Hypertension: Blood pressure acceptable.  Volume optimized with HD.  # Anemia of ESRD: Hemoglobin at goal.  # Metabolic Bone Disease: Monitor calcium phosphorus and continue outpatient medication.  Thank you for the consult.  Discussed with the primary team and patient's daughter as well.  Mauro Arps Tanna Furry 10/25/2022, 11:24 AM

## 2022-10-25 NOTE — TOC Initial Note (Signed)
Transition of Care [facility]) - Initial/Assessment Note    Patient Details  Name: Felicia Williams MRN: [id number] Date of Birth: Jan 19, 1941  [facility] CM/SW Contact:    Salome Arnt, [place] Phone Number: 10/25/2022, 8:08 AM  Clinical Narrative: Pt admitted for acute hypoxemic respiratory failure. Assessment completed due to high risk readmission score. Pt reports her granddaughter lives with her. Pt is independent with ADLs and drives herself to appointments. Her dialysis is MWF 2nd shift at [facility]. Pt plans to return home when medically stable. No needs reported at this time. TOC will continue to follow.                  Expected Discharge Plan: Home/Self Care Barriers to Discharge: Continued Medical Work up   Patient Goals and CMS Choice Patient states their goals for this hospitalization and ongoing recovery are:: return home   Choice offered to / list presented to : Patient [place] ownership interest in [facility].provided to::  (n/a)    Expected Discharge Plan and Services In-house Referral: Clinical Social Work     Living arrangements for the past 2 months: [place]                                      Prior Living Arrangements/Services Living arrangements for the past 2 months: Single Family Home Lives with:: Relatives Patient language and need for interpreter reviewed:: Yes Do you feel safe going back to the place where you live?: Yes      Need for Family Participation in Patient Care: No (Comment)   Current home services: DME (cane, walker, wheelchair) Criminal Activity/Legal Involvement Pertinent to Current Situation/Hospitalization: No - Comment as needed  Activities of Daily Living Home Assistive Devices/Equipment: Oxygen, Cane (specify quad or straight), Walker (specify type) ADL Screening (condition at time of admission) Patient's cognitive ability adequate to safely complete daily activities?: No Is  the patient deaf or have difficulty hearing?: No Does the patient have difficulty seeing, even when wearing glasses/contacts?: No Does the patient have difficulty concentrating, remembering, or making decisions?: No Patient able to express need for assistance with ADLs?: No Does the patient have difficulty dressing or bathing?: Yes Independently performs ADLs?: Yes (appropriate for developmental age) Does the patient have difficulty walking or climbing stairs?: Yes Weakness of Legs: Both Weakness of Arms/Hands: None  Permission Sought/Granted                  Emotional Assessment     Affect (typically observed): Appropriate Orientation: : Oriented to Self, Oriented to Place, Oriented to  Time, Oriented to Situation Alcohol / Substance Use: Not Applicable Psych Involvement: No (comment)  Admission diagnosis:  SOB (shortness of breath) [R06.02] ESRD needing dialysis [N18.6, Z99.2] Essential hypertension [I10] Acute on chronic respiratory failure with hypoxia [J96.21] Acute hypoxemic respiratory failure [J96.01] Patient Active Problem List   Diagnosis Date Noted   Acute hypoxemic respiratory failure 10/14/2022   Nausea and vomiting 10/14/2022   Abnormal stress test 08/16/2022   Chronic diarrhea 08/09/2022   GERD (gastroesophageal reflux disease) 07/12/2022   Hypertensive crisis 07/12/2022   Acute respiratory failure with hypoxia 04/25/2022   Acute pulmonary edema    Pressure injury of skin 04/23/2022   Volume overload 04/22/2022   Elevated troponin 04/22/2022   Hyperkalemia 02/28/2022   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   Anorexia 11/28/2021   Weight loss  11/28/2021   Toxic encephalopathy 09/11/2021   Malnutrition of moderate degree 09/10/2021   Generalized weakness 09/07/2021   Debility 09/07/2021   Change in bowel movement 09/06/2021   DOE (dyspnea on exertion) 08/29/2021   Pruritus 08/28/2021   ESRD on dialysis 08/26/2021   Age-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 fracture 08/26/2021   Body mass index (BMI) 22.0-22.9, adult 08/26/2021   Cramp in limb 08/26/2021   Epistaxis 08/26/2021   Iron deficiency anemia 08/26/2021   Localized, 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   Overactive bladder 08/26/2021   Sciatica 08/26/2021   Vitamin D deficiency 08/26/2021   Panic disorder 08/26/2021   Sepsis 08/26/2021   Gastroparesis 05/23/2021   Chronic cough 05/23/2021   Renal failure 01/15/2019   ARF (acute renal failure) 01/07/2019   Glomerulonephritis 01/07/2019   Psoriasis 01/07/2019   Tachycardia, unspecified 01/07/2019   Macrocytic anemia 08/18/2018   Proteinuria 09/04/2017   Essential hypertension, benign 01/26/2014   Palpitations 01/26/2014   Fatigue due to depression 01/26/2014   Gout 07/30/2012   Mixed hyperlipidemia 10/01/2011   Depression 10/01/2011   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   PCP:  [name], NP Pharmacy:   [facility] [address], [place] [address] [place] [address] Phone: [phone] Fax: [phone]     Social Determinants of Health (SDOH) Social History: SDOH Screenings   Food Insecurity: No Food Insecurity (10/24/2022)  Recent Concern: Food Insecurity - Food Insecurity Present (10/14/2022)  Housing: Low Risk  (10/24/2022)  Transportation Needs: No Transportation Needs (10/24/2022)  Utilities: Not At Risk (10/24/2022)  Tobacco Use: Low Risk  (10/24/2022)   SDOH Interventions:     Readmission Risk Interventions    10/25/2022    8:06 AM 10/15/2022   12:28 PM  Readmission Risk Prevention Plan  Transportation Screening Complete Complete  PCP or Specialist Appt within 3-5 Days  Complete  HRI or Home Care Consult Complete Complete  Social Work Consult for [place] Planning/Counseling Complete Complete  Palliative Care Screening Not Applicable Not Applicable  Medication Review Press photographer) Complete Complete

## 2022-10-25 NOTE — Care Management Obs Status (Signed)
[place] NOTIFICATION   Patient Details  Name: Felicia Williams MRN: [id number] Date of Birth: 03/10/1941   Medicare Observation Status Notification Given:  Yes    Salome Arnt, [place] 10/25/2022, 11:56 AM

## 2022-10-25 NOTE — Care Management CC44 (Signed)
Condition Code 44 Documentation Completed  Patient Details  Name: Felicia Williams MRN: [id number] Date of Birth: Oct 13, 1940   Condition Code 44 given:  Yes Patient signature on Condition Code 44 notice:  Yes Documentation of 2 MD's agreement:  Yes Code 44 added to claim:  Yes    Salome Arnt, [employer] 10/25/2022, 11:56 AM

## 2022-11-19 ENCOUNTER — Ambulatory Visit (INDEPENDENT_AMBULATORY_CARE_PROVIDER_SITE_OTHER): Payer: Medicare Other | Admitting: Gastroenterology

## 2022-12-20 ENCOUNTER — Ambulatory Visit (INDEPENDENT_AMBULATORY_CARE_PROVIDER_SITE_OTHER): Payer: Medicare Other | Admitting: Gastroenterology

## 2022-12-31 ENCOUNTER — Telehealth: Payer: Self-pay | Admitting: Internal Medicine

## 2022-12-31 NOTE — Telephone Encounter (Signed)
Reports worsening weakness, sob. Denies chest pain or dizziness. Reports she has discussed with nephrology nurses who suggest she get a sooner follow up with cardiologist. Currently at dialysis. Reports that she was advised by Dr. Jenene Slicker to contact office for a sooner appointment if her symptoms got worse. Gave sooner appointment to see Mallipeddi on 01/08/2023 @9 :20 am. Advised if she develops worsening symptoms to go to the ED for an evaluation. Verbalized understanding of plan.

## 2022-12-31 NOTE — Telephone Encounter (Signed)
Pt states she feel like something is wrong with her heart, She states she feel very fatigue like she "can't go." She states Dr. Jenene Slicker spoke to her above possibly having a blockage. Please advise.

## 2023-01-06 ENCOUNTER — Observation Stay (HOSPITAL_COMMUNITY)
Admission: EM | Admit: 2023-01-06 | Discharge: 2023-01-07 | Disposition: A | Discharge status: Home / Self Care | Payer: Medicare Other | Attending: Internal Medicine | Admitting: Internal Medicine

## 2023-01-06 ENCOUNTER — Other Ambulatory Visit: Payer: Self-pay

## 2023-01-06 ENCOUNTER — Encounter (HOSPITAL_COMMUNITY): Payer: Self-pay

## 2023-01-06 ENCOUNTER — Emergency Department (HOSPITAL_COMMUNITY): Payer: Medicare Other

## 2023-01-06 DIAGNOSIS — I509 Heart failure, unspecified: Secondary | ICD-10-CM | POA: Insufficient documentation

## 2023-01-06 DIAGNOSIS — N186 End stage renal disease: Secondary | ICD-10-CM | POA: Diagnosis not present

## 2023-01-06 DIAGNOSIS — Z79899 Other long term (current) drug therapy: Secondary | ICD-10-CM | POA: Diagnosis not present

## 2023-01-06 DIAGNOSIS — K3184 Gastroparesis: Secondary | ICD-10-CM | POA: Diagnosis present

## 2023-01-06 DIAGNOSIS — I10 Essential (primary) hypertension: Secondary | ICD-10-CM | POA: Diagnosis present

## 2023-01-06 DIAGNOSIS — K219 Gastro-esophageal reflux disease without esophagitis: Secondary | ICD-10-CM | POA: Diagnosis present

## 2023-01-06 DIAGNOSIS — Z992 Dependence on renal dialysis: Secondary | ICD-10-CM | POA: Diagnosis not present

## 2023-01-06 DIAGNOSIS — K529 Noninfective gastroenteritis and colitis, unspecified: Secondary | ICD-10-CM | POA: Diagnosis present

## 2023-01-06 DIAGNOSIS — J9601 Acute respiratory failure with hypoxia: Secondary | ICD-10-CM | POA: Diagnosis not present

## 2023-01-06 DIAGNOSIS — R197 Diarrhea, unspecified: Secondary | ICD-10-CM | POA: Diagnosis present

## 2023-01-06 DIAGNOSIS — I132 Hypertensive heart and chronic kidney disease with heart failure and with stage 5 chronic kidney disease, or end stage renal disease: Secondary | ICD-10-CM | POA: Diagnosis not present

## 2023-01-06 DIAGNOSIS — R0600 Dyspnea, unspecified: Secondary | ICD-10-CM | POA: Diagnosis present

## 2023-01-06 DIAGNOSIS — R7989 Other specified abnormal findings of blood chemistry: Secondary | ICD-10-CM | POA: Diagnosis present

## 2023-01-06 LAB — CBC WITH DIFFERENTIAL/PLATELET
Abs Immature Granulocytes: 0.04 10*3/uL (ref 0.00–0.07)
Basophils Absolute: 0 10*3/uL (ref 0.0–0.1)
Basophils Relative: 0 %
Eosinophils Absolute: 0.2 10*3/uL (ref 0.0–0.5)
Eosinophils Relative: 2 %
HCT: 29.1 % — ABNORMAL LOW (ref 36.0–46.0)
Hemoglobin: 9.5 g/dL — ABNORMAL LOW (ref 12.0–15.0)
Immature Granulocytes: 0 %
Lymphocytes Relative: 5 %
Lymphs Abs: 0.5 10*3/uL — ABNORMAL LOW (ref 0.7–4.0)
MCH: 30.6 pg (ref 26.0–34.0)
MCHC: 32.6 g/dL (ref 30.0–36.0)
MCV: 93.9 fL (ref 80.0–100.0)
Monocytes Absolute: 0.6 10*3/uL (ref 0.1–1.0)
Monocytes Relative: 6 %
Neutro Abs: 9.9 10*3/uL — ABNORMAL HIGH (ref 1.7–7.7)
Neutrophils Relative %: 87 %
Platelets: 214 10*3/uL (ref 150–400)
RBC: 3.1 MIL/uL — ABNORMAL LOW (ref 3.87–5.11)
RDW: 15.4 % (ref 11.5–15.5)
WBC: 11.3 10*3/uL — ABNORMAL HIGH (ref 4.0–10.5)
nRBC: 0 % (ref 0.0–0.2)

## 2023-01-06 LAB — BRAIN NATRIURETIC PEPTIDE: B Natriuretic Peptide: >4500 pg/mL — ABNORMAL HIGH (ref 0.0–100.0)

## 2023-01-06 LAB — BASIC METABOLIC PANEL
Anion gap: 14 (ref 5–15)
BUN: 46 mg/dL — ABNORMAL HIGH (ref 8–23)
CO2: 26 mmol/L (ref 22–32)
Calcium: 8.1 mg/dL — ABNORMAL LOW (ref 8.9–10.3)
Chloride: 92 mmol/L — ABNORMAL LOW (ref 98–111)
Creatinine, Ser: 5.68 mg/dL — ABNORMAL HIGH (ref 0.44–1.00)
GFR, Estimated: 7 mL/min — ABNORMAL LOW (ref >60)
Glucose, Bld: 97 mg/dL (ref 70–99)
Potassium: 3.9 mmol/L (ref 3.5–5.1)
Sodium: 132 mmol/L — ABNORMAL LOW (ref 135–145)

## 2023-01-06 LAB — TROPONIN I (HIGH SENSITIVITY)
Troponin I (High Sensitivity): 595 ng/L (ref <18)
Troponin I (High Sensitivity): 594 ng/L (ref <18)

## 2023-01-06 MED ORDER — ACETAMINOPHEN 325 MG PO TABS: 650.0000 mg | ORAL_TABLET | Freq: Four times a day (QID) | ORAL | Status: DC | PRN

## 2023-01-06 MED ORDER — SODIUM CHLORIDE 0.9% FLUSH
3.0000 mL | Freq: Two times a day (BID) | INTRAVENOUS | Status: DC
  Administered 2023-01-06 – 2023-01-07 (×3): 3 mL via INTRAVENOUS

## 2023-01-06 MED ORDER — HEPARIN SODIUM (PORCINE) 1000 UNIT/ML DIALYSIS
100.0000 [IU]/kg | INTRAMUSCULAR | Status: DC | PRN
  Administered 2023-01-06: 4300 [IU] via INTRAVENOUS_CENTRAL
  Filled 2023-01-06: qty 5

## 2023-01-06 MED ORDER — ACETAMINOPHEN 650 MG RE SUPP: 650.0000 mg | Freq: Four times a day (QID) | RECTAL | Status: DC | PRN

## 2023-01-06 MED ORDER — LIDOCAINE HCL (PF) 1 % IJ SOLN: 5.0000 mL | INTRAMUSCULAR | Status: DC | PRN

## 2023-01-06 MED ORDER — CARVEDILOL 3.125 MG PO TABS
12.5000 mg | ORAL_TABLET | Freq: Two times a day (BID) | ORAL | Status: DC
  Administered 2023-01-06 – 2023-01-07 (×3): 12.5 mg via ORAL
  Filled 2023-01-06 (×3): qty 4

## 2023-01-06 MED ORDER — SODIUM CHLORIDE 0.9 % IV SOLN: 250.0000 mL | INTRAVENOUS | Status: DC | PRN

## 2023-01-06 MED ORDER — ONDANSETRON HCL 4 MG/2ML IJ SOLN: 4.0000 mg | Freq: Four times a day (QID) | INTRAMUSCULAR | Status: DC | PRN

## 2023-01-06 MED ORDER — SODIUM CHLORIDE 0.9% FLUSH: 3.0000 mL | INTRAVENOUS | Status: DC | PRN

## 2023-01-06 MED ORDER — MIDODRINE HCL 5 MG PO TABS
10.0000 mg | ORAL_TABLET | ORAL | Status: DC
  Administered 2023-01-07: 10 mg via ORAL
  Filled 2023-01-06: qty 2

## 2023-01-06 MED ORDER — LOPERAMIDE HCL 2 MG PO CAPS: 2.0000 mg | ORAL_CAPSULE | ORAL | Status: DC | PRN

## 2023-01-06 MED ORDER — PENTAFLUOROPROP-TETRAFLUOROETH EX AERO
1.0000 | INHALATION_SPRAY | CUTANEOUS | Status: DC | PRN
  Administered 2023-01-06: 1 via TOPICAL
  Filled 2023-01-06: qty 30

## 2023-01-06 MED ORDER — ONDANSETRON HCL 4 MG PO TABS: 4.0000 mg | ORAL_TABLET | Freq: Four times a day (QID) | ORAL | Status: DC | PRN

## 2023-01-06 MED ORDER — TRAZODONE HCL 50 MG PO TABS
100.0000 mg | ORAL_TABLET | Freq: Every day | ORAL | Status: DC
  Administered 2023-01-06: 100 mg via ORAL
  Filled 2023-01-06: qty 2

## 2023-01-06 MED ORDER — HEPARIN SODIUM (PORCINE) 5000 UNIT/ML IJ SOLN
5000.0000 [IU] | Freq: Three times a day (TID) | INTRAMUSCULAR | Status: DC
  Administered 2023-01-06 – 2023-01-07 (×2): 5000 [IU] via SUBCUTANEOUS
  Filled 2023-01-06 (×2): qty 1

## 2023-01-06 MED ORDER — PENTAFLUOROPROP-TETRAFLUOROETH EX AERO
INHALATION_SPRAY | CUTANEOUS | Status: AC
  Filled 2023-01-06: qty 30

## 2023-01-06 MED ORDER — CHLORHEXIDINE GLUCONATE CLOTH 2 % EX PADS: 6.0000 | MEDICATED_PAD | Freq: Every day | CUTANEOUS | Status: DC

## 2023-01-06 MED ORDER — LIDOCAINE-PRILOCAINE 2.5-2.5 % EX CREA: 1.0000 | TOPICAL_CREAM | CUTANEOUS | Status: DC | PRN

## 2023-01-06 MED ORDER — HYDROMORPHONE HCL 1 MG/ML IJ SOLN
0.5000 mg | INTRAMUSCULAR | Status: DC | PRN
  Administered 2023-01-06 (×2): 1 mg via INTRAVENOUS
  Filled 2023-01-06 (×2): qty 1

## 2023-01-06 MED ORDER — RENA-VITE PO TABS
1.0000 | ORAL_TABLET | Freq: Every day | ORAL | Status: DC
  Administered 2023-01-06 – 2023-01-07 (×2): 1 via ORAL
  Filled 2023-01-06 (×2): qty 1

## 2023-01-06 MED ORDER — HEPARIN SODIUM (PORCINE) 1000 UNIT/ML IJ SOLN
INTRAMUSCULAR | Status: AC
  Filled 2023-01-06: qty 5

## 2023-01-06 NOTE — Progress Notes (Signed)
   HEMODIALYSIS TREATMENT NOTE:  3.5 hour low-heparin treatment completed using left upper arm AVG (15g/antegrade). Adjusted goal was met: 2.5 liters removed without interruption in UF.  All blood was returned and hemostasis was achieved in 20 minutes.  Pt wishes to discharge after dialysis.  Has O2 for home use (spO2 89% on room air while sleeping) and will attend regular outpatient HD treatment tomorrow.    01/06/23 1855  Vitals  Temp 98 F (36.7 C)  Temp Source Oral  BP (!) 170/52  MAP (mmHg) 89  BP Location Right Arm  BP Method Automatic  Patient Position (if appropriate) Sitting  Pulse Rate 70  Pulse Rate Source Monitor  ECG Heart Rate 71  Resp 19  Oxygen Therapy  SpO2 98 %  O2 Device Nasal Cannula  O2 Flow Rate (L/min) 1 L/min  Hepatitis B Pre Treatment Patient Checks  Hepatitis B Surface Antigen Results Pending (labs drawn, awaiting results)  Date Hepatitis B Surface Antigen Drawn 01/06/23  Hep B Antibody Quant/Post still pending  Date Hep B Antibody Quant/Post Drawn 01/06/23  Patient's Immunity Status Pending (labs drawn, awaiting results)  Isolation Initiated Unknown Hepatitis status (Tablo1 - chemical disinfection required)  Post Treatment  Dialyzer Clearance Lightly streaked  Duration of HD Treatment -hour(s) 3.5 hour(s)  Hemodialysis Intake (mL) 0 mL  Liters Processed 83  Fluid Removed (mL) 2500 mL  Tolerated HD Treatment Yes  Post-Hemodialysis Comments Goal met  AVG/AVF Arterial Site Held (minutes) 7 minutes  AVG/AVF Venous Site Held (minutes) 7 minutes  Fistula / Graft Left Upper arm Arteriovenous vein graft  No placement date or time found.   Placed prior to admission: Yes  Orientation: Left  Access Location: Upper arm  Access Type: Arteriovenous vein graft  Fistula / Graft Assessment Thrill;Bruit  Status Patent    Pt was transported back to 336.  Hand-off given to Minerva Areola, Charity fundraiser.  Arman Filter, RN [facility]

## 2023-01-06 NOTE — Discharge Summary (Signed)
Physician Discharge Summary  Felicia Williams ZOX:[id number] DOB: [birth year] DOA: 01/06/2023  PCP: Rebekah Chesterfield, NP  Admit date: 01/06/2023  Discharge date: 01/06/2023  Admitted From:Home  Disposition:  Home  Recommendations for Outpatient Follow-up:  Follow up with PCP in 1-2 weeks Continue home medications as prior  Home Health:None  Equipment/Devices:None  Discharge Condition:Stable  CODE STATUS: Full  Brief/Interim Summary:  Felicia Williams is a 82 y.o. female with medical history significant for ESRD on HD MWF, hypertension, GERD, dyslipidemia, chronic diarrhea, gastroparesis, and osteoporosis/spondylosis, who presented to the ED with increased shortness of breath that started at 5 AM this morning.  She completed her hemodialysis on Friday and may have had some dietary indiscretions. She was admitted for acute on chronic hypoxemic respiratory failure secondary to volume overload and underwent hemodialysis and is back to baseline. She will continue further HD per her routine tomorrow. No other acute events or concerns noted.  Discharge Diagnoses:  Principal Problem:   Acute hypoxemic respiratory failure (HCC) Active Problems:   ESRD on dialysis (HCC)   Elevated troponin   Essential hypertension, benign   Gastroparesis   GERD (gastroesophageal reflux disease)   Chronic diarrhea  Principal discharge diagnosis: Acute on chronic hypoxemic respiratory failure secondary to volume overload in the setting of ESRD/HD.  Discharge Instructions  Discharge Instructions     Diet - low sodium heart healthy   Complete by: As directed    Increase activity slowly   Complete by: As directed    No wound care   Complete by: As directed       Allergies as of 01/06/2023       Reactions   Cefepime Other (See Comments)   Feb 2023 Encephalopathy with questionable seizures. Seems to be tolerating cefazolin    Morphine Other (See Comments)   "Dry heaving like crazy"   Sulfa  Antibiotics Other (See Comments)   Shut pt's kidneys down        Medication List     TAKE these medications    acetaminophen 325 MG tablet Commonly known as: TYLENOL Take 2 tablets (650 mg total) by mouth every 6 (six) hours as needed for mild pain.   camphor-menthol lotion Commonly known as: SARNA Apply topically as needed for itching.   carvedilol 12.5 MG tablet Commonly known as: COREG Take 1 tablet by mouth 2 (two) times daily.   cephALEXin 500 MG capsule Commonly known as: KEFLEX Take 500 mg by mouth every 12 (twelve) hours.   famotidine 40 MG tablet Commonly known as: Pepcid Take 1 tablet (40 mg total) by mouth at bedtime. What changed:  when to take this reasons to take this   hydrALAZINE 50 MG tablet Commonly known as: APRESOLINE Take 50 mg by mouth 2 (two) times daily. Do not take morning dose on Dialysis days. Monday,Wednesday and Friday   HYDROcodone-acetaminophen 10-325 MG tablet Commonly known as: NORCO Take 1 tablet by mouth 3 (three) times daily as needed.   loperamide 2 MG capsule Commonly known as: IMODIUM Take 1 capsule (2 mg total) by mouth as needed for diarrhea or loose stools.   midodrine 10 MG tablet Commonly known as: PROAMATINE Take 1 tablet (10 mg total) by mouth every Monday, Wednesday, and Friday.   multivitamin Tabs tablet Take 1 tablet by mouth daily. One daily   nitroGLYCERIN 0.4 MG SL tablet Commonly known as: NITROSTAT Place 0.4 mg under the tongue every 5 (five) minutes as needed for chest pain.   traZODone 100 MG  tablet Commonly known as: DESYREL Take 100 mg by mouth at bedtime.        Follow-up Information     Rebekah Chesterfield, NP. Schedule an appointment as soon as possible for a visit.   Specialty: Internal Medicine Contact information: [address] [place] [address] [phone]                Allergies  Allergen Reactions   Cefepime Other (See Comments)    Feb 2023 Encephalopathy with  questionable seizures. Seems to be tolerating cefazolin    Morphine Other (See Comments)    "Dry heaving like crazy"   Sulfa Antibiotics Other (See Comments)    Shut pt's kidneys down    Consultations: Nephrology   Procedures/Studies: DG Chest Portable 1 View  Result Date: 01/06/2023 CLINICAL DATA:  Short of breath EXAM: PORTABLE CHEST 1 VIEW COMPARISON:  Prior chest x-ray 10/24/2022 FINDINGS: Cardiomegaly with pulmonary vascular congestion extensive bronchitic changes. Patchy foci of increased interstitial airspace opacity present scattered throughout the right and left lung. Probable small bilateral pleural effusions and associated bibasilar atelectasis. Atherosclerotic calcifications present in the transverse aorta. No acute osseous abnormality. IMPRESSION: 1. Cardiomegaly with pulmonary vascular congestion bordering on mild edema. 2. Multifocal patchy airspace opacities bilaterally. Differential considerations include areas of asymmetric pulmonary edema versus multifocal pneumonia including viral pneumonia. 3. Small bilateral pleural effusions and associated bibasilar atelectasis. 4. Aortic atherosclerotic vascular calcifications. Electronically Signed   By: Malachy Moan M.D.   On: 01/06/2023 10:03     Discharge Exam: Vitals:   01/06/23 1730 01/06/23 1800  BP: (!) 167/54 (!) 155/52  Pulse:    Resp: 19 18  Temp:    SpO2:  (!) 89%   Vitals:   01/06/23 1630 01/06/23 1700 01/06/23 1730 01/06/23 1800  BP: (!) 133/48 (!) 170/59 (!) 167/54 (!) 155/52  Pulse:      Resp: 18 18 19 18   Temp:      TempSrc:      SpO2:    (!) 89%  Weight:      Height:        General: Pt is alert, awake, not in acute distress Cardiovascular: RRR, S1/S2 +, no rubs, no gallops Respiratory: CTA bilaterally, no wheezing, no rhonchi, [place] Abdominal: Soft, NT, ND, bowel sounds + Extremities: no edema, no cyanosis    The results of significant diagnostics from this hospitalization (including imaging,  microbiology, ancillary and laboratory) are listed below for reference.     Microbiology: No results found for this or any previous visit (from the past 240 hour(s)).   Labs: BNP (last 3 results) Recent Labs    04/22/22 0829 10/14/22 0751 01/06/23 0917  BNP 4,375.0* >4,500.0* >4,500.0*   Basic Metabolic Panel: Recent Labs  Lab 01/06/23 0917  NA 132*  K 3.9  CL 92*  CO2 26  GLUCOSE 97  BUN 46*  CREATININE 5.68*  CALCIUM 8.1*   Liver Function Tests: No results for input(s): "AST", "ALT", "ALKPHOS", "BILITOT", "PROT", "ALBUMIN" in the last 168 hours. No results for input(s): "LIPASE", "AMYLASE" in the last 168 hours. No results for input(s): "AMMONIA" in the last 168 hours. CBC: Recent Labs  Lab 01/06/23 0917  WBC 11.3*  NEUTROABS 9.9*  HGB 9.5*  HCT 29.1*  MCV 93.9  PLT 214   Cardiac Enzymes: No results for input(s): "CKTOTAL", "CKMB", "CKMBINDEX", "TROPONINI" in the last 168 hours. BNP: Invalid input(s): "POCBNP" CBG: No results for input(s): "GLUCAP" in the last 168  hours. D-Dimer No results for input(s): "DDIMER" in the last 72 hours. Hgb A1c No results for input(s): "HGBA1C" in the last 72 hours. Lipid Profile No results for input(s): "CHOL", "HDL", "LDLCALC", "TRIG", "CHOLHDL", "LDLDIRECT" in the last 72 hours. Thyroid function studies No results for input(s): "TSH", "T4TOTAL", "T3FREE", "THYROIDAB" in the last 72 hours.  Invalid input(s): "FREET3" Anemia work up No results for input(s): "VITAMINB12", "FOLATE", "FERRITIN", "TIBC", "IRON", "RETICCTPCT" in the last 72 hours. Urinalysis    Component Value Date/Time   COLORURINE COLORLESS (A) 08/29/2021 0827   APPEARANCEUR CLEAR 08/29/2021 0827   LABSPEC 1.005 08/29/2021 0827   PHURINE 8.0 08/29/2021 0827   GLUCOSEU >=500 (A) 08/29/2021 0827   HGBUR NEGATIVE 08/29/2021 0827   BILIRUBINUR NEGATIVE 08/29/2021 0827   KETONESUR NEGATIVE 08/29/2021 0827   PROTEINUR 30 (A) 08/29/2021 0827    UROBILINOGEN 0.2 01/11/2014 1635   NITRITE NEGATIVE 08/29/2021 0827   LEUKOCYTESUR NEGATIVE 08/29/2021 0827   Sepsis Labs Recent Labs  Lab 01/06/23 0917  WBC 11.3*   Microbiology No results found for this or any previous visit (from the past 240 hour(s)).   Time coordinating discharge: 35 minutes  SIGNED:   Erick Blinks, DO [facility] 01/06/2023, 6:22 PM  If 7PM-7AM, please contact night-coverage [email or web address]

## 2023-01-06 NOTE — ED Triage Notes (Signed)
Pt woke up this morning around 4 with SOB. Pt has home oxygen that she wears when she needs it, which she doesn't wear often. Pt placed herself on 5L. Pt is a diaylsis pt, had treatment on Friday. Pt states she feels like she has some fluid build up.

## 2023-01-06 NOTE — ED Notes (Signed)
Attempted to call report x 1  

## 2023-01-06 NOTE — H&P (Signed)
History and Physical    Felicia Williams QMV:[id number] DOB: 1941/06/12 DOA: 01/06/2023  PCP: Rebekah Chesterfield, NP   Patient coming from: Home  Chief Complaint: Dyspnea  HPI: Felicia Williams is a 82 y.o. female with medical history significant for ESRD on HD MWF, hypertension, GERD, dyslipidemia, chronic diarrhea, gastroparesis, and osteoporosis/spondylosis, who presented to the ED with increased shortness of breath that started at 5 AM this morning.  She completed her hemodialysis on Friday and may have had some dietary indiscretions.  She denies any significant fevers or chills or cough.  She continues to have chronic low back pain and diarrhea as prior.  She states that she used to have dialysis 4 times a week for short period of time, but then was switched back to 3 days/week.  She feels that she may benefit from increased frequency.   ED Course: Vital signs stable and patient afebrile.  Currently on 5 L nasal cannula.  Troponin 595 and BNP 4500.  EKG with no acute findings and chest x-ray with cardiomegaly and pulmonary vascular congestion noted.  Nephrology notified who will initiate hemodialysis today.  Review of Systems: Reviewed as noted above, otherwise negative.  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 Karilyn Cota.   GERD (gastroesophageal reflux disease)    Glomerulonephritis    Gout    Mixed hyperlipidemia    Spondylosis     Past Surgical History:  Procedure Laterality Date   APPENDECTOMY     AV FISTULA PLACEMENT Left 03/20/2022   Procedure: INSERTION OF LEFT UPPER ARM ARTERIOVENOUS (AV) GORE-TEX GRAFT;  Surgeon: Larina Earthly, MD;  Location: [facility];  Service: Vascular;  Laterality:  Left;   CATARACT EXTRACTION     COLONOSCOPY N/A 12/28/2015   Procedure: COLONOSCOPY;  Surgeon: Malissa Hippo, MD;  Location: [facility];  Service: Endoscopy;  Laterality: N/A;  815   ESOPHAGOGASTRODUODENOSCOPY N/A 11/16/2020   Procedure: ESOPHAGOGASTRODUODENOSCOPY (EGD);  Surgeon: Malissa Hippo, MD;  Location: [facility];  Service: Endoscopy;  Laterality: N/A;  1:15   EXCHANGE OF A DIALYSIS CATHETER N/A 03/20/2022   Procedure: EXCHANGE OF A DIALYSIS CATHETER;  Surgeon: Larina Earthly, MD;  Location: [facility];  Service: Vascular;  Laterality: N/A;   INSERTION OF DIALYSIS CATHETER Right 09/30/2021   Procedure: INSERTION OF TUNNELED DIALYSIS CATHETER;  Surgeon: Maeola Harman, MD;  Location: [facility] OR;  Service: Vascular;  Laterality: Right;   IR FLUORO GUIDE CV LINE RIGHT  01/16/2019   IR REMOVAL TUN CV CATH W/O FL  07/01/2019   IR US GUIDE VASC ACCESS RIGHT  01/16/2019   POLYPECTOMY  11/16/2020   Procedure: POLYPECTOMY;  Surgeon: Malissa Hippo, MD;  Location: [facility];  Service: Endoscopy;;  gastric   REMOVAL OF A DIALYSIS CATHETER  09/30/2021   Procedure: REMOVAL OF A PERITONEAL DIALYSIS CATHETER;  Surgeon: Maeola Harman, MD;  Location: [facility] OR;  Service: Vascular;;   REMOVAL OF A DIALYSIS CATHETER N/A 05/22/2022   Procedure: MINOR REMOVAL OF A TUNNELED DIALYSIS CATHETER;  Surgeon: Larina Earthly, MD;  Location: [facility];  Service: Vascular;  Laterality: N/A;  TUBAL LIGATION       reports that she has never smoked. She has never been exposed to tobacco smoke. She has never used smokeless tobacco. She reports that she does not drink alcohol and does not use drugs.  Allergies  Allergen Reactions   Cefepime Other (See Comments)    Feb 2023 Encephalopathy with questionable seizures. Seems to be tolerating cefazolin    Morphine Other (See Comments)    "Dry heaving like crazy"   Sulfa Antibiotics Other (See Comments)    Shut pt's kidneys down    Family History   Problem Relation Age of Onset   CAD Father    Heart attack Father    Diabetes Mellitus II Father    Hypertension Father    Lupus Brother     Prior to Admission medications   Medication Sig Start Date End Date Taking? Authorizing Provider  acetaminophen (TYLENOL) 325 MG tablet Take 2 tablets (650 mg total) by mouth every 6 (six) hours as needed for mild pain. Patient not taking: Reported on 10/24/2022 09/12/21   Love, Evlyn Kanner, PA-C  alum & mag hydroxide-simeth (MAALOX/MYLANTA) 200-200-20 MG/5ML suspension Take 30 mLs by mouth 3 (three) times daily. Patient not taking: Reported on 10/24/2022 10/15/22   Kendell Bane, MD  camphor-menthol [facility]) lotion Apply topically as needed for itching. Patient not taking: Reported on 10/24/2022 09/12/21   Love, Evlyn Kanner, PA-C  carvedilol (COREG) 12.5 MG tablet Take 1 tablet by mouth 2 (two) times daily.    [provider]  famotidine (PEPCID) 40 MG tablet Take 1 tablet (40 mg total) by mouth at bedtime. Patient not taking: Reported on 10/24/2022 11/28/21   Malissa Hippo, MD  hydrALAZINE (APRESOLINE) 50 MG tablet Take 50 mg by mouth 2 (two) times daily. Do not take morning dose on Dialysis days. Monday,Wednesday and Friday    [provider]  loperamide (IMODIUM) 2 MG capsule Take 1 capsule (2 mg total) by mouth as needed for diarrhea or loose stools. Patient not taking: Reported on 10/24/2022 09/12/21   Jacquelynn Cree, PA-C  midodrine (PROAMATINE) 10 MG tablet Take 1 tablet (10 mg total) by mouth every Monday, Wednesday, and Friday. 10/26/22   Sherryll Burger, Ibeth Fahmy D, DO  multivitamin (RENA-VIT) TABS tablet Take 1 tablet by mouth daily. One daily    [provider]  traZODone (DESYREL) 100 MG tablet Take 100 mg by mouth at bedtime. 07/07/22   [provider]    Physical Exam: Vitals:   01/06/23 0854 01/06/23 0900 01/06/23 1000 01/06/23 1015  BP:  (!) 169/71 (!) 164/67 (!) 162/79  Pulse:  (!) 103 82 82  Resp:  (!) 32 (!) 32 (!) 33   Temp:  97.7 F (36.5 C)    TempSrc:  Oral    SpO2:  98% 98% 99%  Weight: 43.1 kg     Height: 4\' 11"  (1.499 m)       Constitutional: NAD, calm, comfortable Vitals:   01/06/23 0854 01/06/23 0900 01/06/23 1000 01/06/23 1015  BP:  (!) 169/71 (!) 164/67 (!) 162/79  Pulse:  (!) 103 82 82  Resp:  (!) 32 (!) 32 (!) 33  Temp:  97.7 F (36.5 C)    TempSrc:  Oral    SpO2:  98% 98% 99%  Weight: 43.1 kg     Height: 4\' 11"  (1.499 m)      Eyes: lids and conjunctivae normal Neck: normal, supple Respiratory: clear to auscultation bilaterally. Normal respiratory effort. No  accessory muscle use.  Currently on 5 L nasal cannula Cardiovascular: Regular rate and rhythm, no murmurs. Abdomen: no tenderness, no distention. Bowel sounds positive.  Musculoskeletal:  No edema. Skin: no rashes, lesions, ulcers.  Psychiatric: Flat affect  Labs on Admission: I have personally reviewed following labs and imaging studies  CBC: Recent Labs  Lab 01/06/23 0917  WBC 11.3*  NEUTROABS 9.9*  HGB 9.5*  HCT 29.1*  MCV 93.9  PLT 214   Basic Metabolic Panel: Recent Labs  Lab 01/06/23 0917  NA 132*  K 3.9  CL 92*  CO2 26  GLUCOSE 97  BUN 46*  CREATININE 5.68*  CALCIUM 8.1*   GFR: Estimated Creatinine Clearance: 5.2 mL/min (A) (by C-G formula based on SCr of 5.68 mg/dL (H)). Liver Function Tests: No results for input(s): "AST", "ALT", "ALKPHOS", "BILITOT", "PROT", "ALBUMIN" in the last 168 hours. No results for input(s): "LIPASE", "AMYLASE" in the last 168 hours. No results for input(s): "AMMONIA" in the last 168 hours. Coagulation Profile: No results for input(s): "INR", "PROTIME" in the last 168 hours. Cardiac Enzymes: No results for input(s): "CKTOTAL", "CKMB", "CKMBINDEX", "TROPONINI" in the last 168 hours. BNP (last 3 results) No results for input(s): "PROBNP" in the last 8760 hours. HbA1C: No results for input(s): "HGBA1C" in the last 72 hours. CBG: No results for input(s): "GLUCAP"  in the last 168 hours. Lipid Profile: No results for input(s): "CHOL", "HDL", "LDLCALC", "TRIG", "CHOLHDL", "LDLDIRECT" in the last 72 hours. Thyroid Function Tests: No results for input(s): "TSH", "T4TOTAL", "FREET4", "T3FREE", "THYROIDAB" in the last 72 hours. Anemia Panel: No results for input(s): "VITAMINB12", "FOLATE", "FERRITIN", "TIBC", "IRON", "RETICCTPCT" in the last 72 hours. Urine analysis:    Component Value Date/Time   COLORURINE COLORLESS (A) 08/29/2021 0827   APPEARANCEUR CLEAR 08/29/2021 0827   LABSPEC 1.005 08/29/2021 0827   PHURINE 8.0 08/29/2021 0827   GLUCOSEU >=500 (A) 08/29/2021 0827   HGBUR NEGATIVE 08/29/2021 0827   BILIRUBINUR NEGATIVE 08/29/2021 0827   KETONESUR NEGATIVE 08/29/2021 0827   PROTEINUR 30 (A) 08/29/2021 0827   UROBILINOGEN 0.2 01/11/2014 1635   NITRITE NEGATIVE 08/29/2021 0827   LEUKOCYTESUR NEGATIVE 08/29/2021 0827    Radiological Exams on Admission: DG Chest Portable 1 View  Result Date: 01/06/2023 CLINICAL DATA:  Short of breath EXAM: PORTABLE CHEST 1 VIEW COMPARISON:  Prior chest x-ray 10/24/2022 FINDINGS: Cardiomegaly with pulmonary vascular congestion extensive bronchitic changes. Patchy foci of increased interstitial airspace opacity present scattered throughout the right and left lung. Probable small bilateral pleural effusions and associated bibasilar atelectasis. Atherosclerotic calcifications present in the transverse aorta. No acute osseous abnormality. IMPRESSION: 1. Cardiomegaly with pulmonary vascular congestion bordering on mild edema. 2. Multifocal patchy airspace opacities bilaterally. Differential considerations include areas of asymmetric pulmonary edema versus multifocal pneumonia including viral pneumonia. 3. Small bilateral pleural effusions and associated bibasilar atelectasis. 4. Aortic atherosclerotic vascular calcifications. Electronically Signed   By: Malachy Moan M.D.   On: 01/06/2023 10:03    EKG: Independently  reviewed. NSR 95bpm.  Assessment/Plan Principal Problem:   Acute hypoxemic respiratory failure (HCC) Active Problems:   ESRD on dialysis (HCC)   Elevated troponin   Essential hypertension, benign   Gastroparesis   GERD (gastroesophageal reflux disease)   Chronic diarrhea    Acute on chronic hypoxemic respiratory failure secondary to volume overload -Plan for hemodialysis per nephrology today, anticipate discharge in a.m. 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 Observation status is judged to be reasonable and necessary in order to provide the required intensity of service to ensure the patient's safety. The patient's presenting symptoms, physical exam findings, and initial radiographic and laboratory data in the context of their medical condition is felt to place them at decreased risk for further clinical deterioration. Furthermore, it is anticipated that the patient will be medically stable for discharge from the hospital within 2 midnights of admission.    Keeli Roberg D Sherryll Burger DO [facility]  If 7PM-7AM, please contact night-coverage [email or web address]  01/06/2023, 11:27 AM

## 2023-01-06 NOTE — Consult Note (Signed)
Reason for Consult: Volume overload in patient with end-stage renal disease Referring Physician: Maurilio Lovely, DO [facility])  HPI:  82 year old woman with past medical history significant for hypertension, dyslipidemia, gastroparesis not associated with diabetes, GERD and end-stage renal disease on hemodialysis.  Presents to the emergency room with increased shortness of breath starting at about 5:00 this morning after a night of poor sleep/restlessness.  She reports to have completed her entire dialysis treatment on Friday and may have had some dietary indiscretions yesterday.  She denies any cough, sputum production, wheezing, fever or chills and does not have any nausea or vomiting.  She has chronic diarrhea and reports that she has been "trying to increase weight".  She denies any urinary complaints.  Dialysis prescription: [name], MWF, EDW unclear (patient unsure if 40 or 44 kg).  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 Karilyn Cota.   GERD (gastroesophageal reflux disease)    Glomerulonephritis    Gout    Mixed hyperlipidemia    Spondylosis     Past Surgical History:  Procedure Laterality Date   APPENDECTOMY     AV FISTULA PLACEMENT Left 03/20/2022   Procedure: INSERTION OF LEFT UPPER ARM ARTERIOVENOUS (AV) GORE-TEX GRAFT;  Surgeon: Larina Earthly, MD;  Location: [facility] ORS;  Service: Vascular;  Laterality: Left;   CATARACT EXTRACTION     COLONOSCOPY N/A 12/28/2015   Procedure: COLONOSCOPY;  Surgeon: Malissa Hippo, MD;  Location: [facility] SUITE;  Service: Endoscopy;  Laterality: N/A;  815   ESOPHAGOGASTRODUODENOSCOPY N/A 11/16/2020   Procedure: ESOPHAGOGASTRODUODENOSCOPY (EGD);   Surgeon: Malissa Hippo, MD;  Location: [facility] END[facility] SU[facility];  Service: Endoscopy;  Laterality: N/A;  1:15   EXCHANGE OF A DIALYSIS CATHETER N/A 03/20/2022   Procedure: EXCHANGE OF A DIALYSIS CATHETER;  Surgeon: Larina Earthly, MD;  Location: [facility] OR[facility];  Service: Vascular;  Laterality: N/A;   INSERTION OF DIALYSIS CATHETER Right 09/30/2021   Procedure: INSERTION OF TUNNELED DIALYSIS CATHETER;  Surgeon: Maeola Harman, MD;  Location: [facility] OR;  Service: Vascular;  Laterality: Right;   IR FLUORO GUIDE CV LINE RIGHT  01/16/2019   IR REMOVAL TUN CV CATH W/O FL  07/01/2019   IR US GUIDE VASC ACCESS RIGHT  01/16/2019   POLYPECTOMY  11/16/2020   Procedure: POLYPECTOMY;  Surgeon: Malissa Hippo, MD;  Location: [facility];  Service: Endoscopy;;  gastric   REMOVAL OF A DIALYSIS CATHETER  09/30/2021   Procedure: REMOVAL OF A PERITONEAL DIALYSIS CATHETER;  Surgeon: Maeola Harman, MD;  Location: [facility] OR;  Service: Vascular;;   REMOVAL OF A DIALYSIS CATHETER N/A 05/22/2022   Procedure: MINOR REMOVAL OF A TUNNELED DIALYSIS CATHETER;  Surgeon: Larina Earthly, MD;  Location: [facility] OR[facility];  Service: Vascular;  Laterality: N/A;   TUBAL LIGATION      Family History  Problem Relation Age of Onset   CAD Father    Heart attack Father    Diabetes Mellitus II Father    Hypertension Father    Lupus Brother     Social History:  reports that she has never smoked. She  has never been exposed to tobacco smoke. She has never used smokeless tobacco. She reports that she does not drink alcohol and does not use drugs.  Allergies:  Allergies  Allergen Reactions   Cefepime Other (See Comments)    Feb 2023 Encephalopathy with questionable seizures. Seems to be tolerating cefazolin    Morphine Other (See Comments)    "Dry heaving like crazy"   Sulfa Antibiotics Other (See Comments)    Shut pt's kidneys down    Medications: I have reviewed the patient's current medications. Scheduled:  Chlorhexidine Gluconate Cloth   6 each Topical Q0600      Latest Ref Rng & Units 01/06/2023    9:17 AM 10/24/2022   12:39 PM 10/15/2022    4:42 AM  BMP  Glucose 70 - 99 mg/dL 97  96  96   BUN 8 - 23 mg/dL 46  63  90   Creatinine 0.44 - 1.00 mg/dL 1.61  0.96  0.45   Sodium 135 - 145 mmol/L 132  133  135   Potassium 3.5 - 5.1 mmol/L 3.9  4.4  4.7   Chloride 98 - 111 mmol/L 92  96  95   CO2 22 - 32 mmol/L 26  23  23    Calcium 8.9 - 10.3 mg/dL 8.1  8.1  8.0       Latest Ref Rng & Units 01/06/2023    9:17 AM 10/24/2022   12:39 PM 10/15/2022    4:42 AM  CBC  WBC 4.0 - 10.5 K/uL 11.3  16.3  5.4   Hemoglobin 12.0 - 15.0 g/dL 9.5  40.9  9.3   Hematocrit 36.0 - 46.0 % 29.1  32.6  28.2   Platelets 150 - 400 K/uL 214  266  193     DG Chest Portable 1 View  Result Date: 01/06/2023 CLINICAL DATA:  Short of breath EXAM: PORTABLE CHEST 1 VIEW COMPARISON:  Prior chest x-ray 10/24/2022 FINDINGS: Cardiomegaly with pulmonary vascular congestion extensive bronchitic changes. Patchy foci of increased interstitial airspace opacity present scattered throughout the right and left lung. Probable small bilateral pleural effusions and associated bibasilar atelectasis. Atherosclerotic calcifications present in the transverse aorta. No acute osseous abnormality. IMPRESSION: 1. Cardiomegaly with pulmonary vascular congestion bordering on mild edema. 2. Multifocal patchy airspace opacities bilaterally. Differential considerations include areas of asymmetric pulmonary edema versus multifocal pneumonia including viral pneumonia. 3. Small bilateral pleural effusions and associated bibasilar atelectasis. 4. Aortic atherosclerotic vascular calcifications. Electronically Signed   By: Malachy Moan M.D.   On: 01/06/2023 10:03    Review of Systems  Constitutional:  Negative for appetite change, chills, fatigue and fever.  HENT:  Negative for facial swelling, nosebleeds, sore throat and trouble swallowing.   Eyes:  Negative for photophobia and visual  disturbance.  Respiratory:  Positive for chest tightness and shortness of breath. Negative for cough.   Cardiovascular:  Negative for chest pain and leg swelling.  Gastrointestinal:  Positive for diarrhea. Negative for blood in stool, nausea and vomiting.       Chronic diarrhea  Endocrine: Negative for cold intolerance and heat intolerance.  Genitourinary:  Negative for dysuria, hematuria and urgency.  Musculoskeletal:  Negative for arthralgias, back pain and gait problem.  Skin:  Negative for rash and wound.  Neurological:  Negative for tremors, weakness, light-headedness and headaches.  Psychiatric/Behavioral:  The patient is nervous/anxious.    Blood pressure (!) 162/79, pulse 82, temperature 97.7 F (36.5 C), temperature source Oral, resp. rate (!) 33,  height 4\' 11"  (1.499 m), weight 43.1 kg, SpO2 99 %. Physical Exam Vitals and nursing note reviewed.  Constitutional:      Appearance: She is well-developed and normal weight. She is ill-appearing.  HENT:     Head: Normocephalic and atraumatic.     Mouth/Throat:     Mouth: Mucous membranes are moist.     Pharynx: Oropharynx is clear.  Eyes:     Extraocular Movements: Extraocular movements intact.  Neck:     Vascular: JVD present.     Comments: 10 cm JVP Cardiovascular:     Rate and Rhythm: Normal rate and regular rhythm.     Heart sounds: No murmur heard.    No gallop.  Pulmonary:     Effort: Accessory muscle usage present.     Breath sounds: Examination of the right-lower field reveals rales. Examination of the left-lower field reveals rales. Rales present.  Musculoskeletal:     Cervical back: Normal range of motion and neck supple.     Right lower leg: No edema.     Left lower leg: No edema.     Comments: Left upper arm arc graft with palpable thrill  Skin:    General: Skin is warm and dry.     Findings: No erythema.  Neurological:     Mental Status: She is alert and oriented to person, place, and time.  Psychiatric:         Mood and Affect: Mood normal.     Assessment/Plan: 1.  Acute hypoxic respiratory failure: Secondary to volume overload/pulmonary edema in patient with end-stage renal disease.  Plan to manage with urgent hemodialysis/ultrafiltration today. 2.  End-stage renal disease: Typically on a Monday/Wednesday/Friday dialysis schedule and presents with volume overload today.  Will undertake dialysis today for volume management and reevaluate tomorrow for inpatient versus outpatient dialysis (she typically goes to second shift at [facility]; 11 AM). 3.  Hypertension: Blood pressures elevated and likely related to her presenting symptoms with volume overload and acute discomfort.  Monitor with ultrafiltration-she has propensity for intradialytic blood pressure drops.  Will not get midodrine today. 4.  Hyponatremia: Secondary to volume overload and poor restriction of fluid intake.  Reeducated. 5.  Anemia of chronic disease: Hemoglobin level slightly depressed, monitor with ultrafiltration suspecting that current value might be skewed by dilution.  Dagoberto Ligas 01/06/2023, 11:11 AM

## 2023-01-06 NOTE — ED Provider Notes (Signed)
[place] EMERGENCY DEPARTMENT AT [facility] Provider Note   CSN: [id number] Arrival date & time: 01/06/23  0846     History  Chief Complaint  Patient presents with   Shortness of Breath    Felicia Williams is a 82 y.o. female.  Pt is a 82 yo female with pmhx significant for ESRD on HD (MWF), htn, gout, hld, gerd, and CHF.  Pt feels like she should be on dialysis 4 times a week as she does better when it is 4 times. She developed sob this am around 0400.  She has oxygen that she wears prn, but had to use it this am.  She feels like she is full of fluid.       Home Medications Prior to Admission medications   Medication Sig Start Date End Date Taking? Authorizing Provider  acetaminophen (TYLENOL) 325 MG tablet Take 2 tablets (650 mg total) by mouth every 6 (six) hours as needed for mild pain. Patient not taking: Reported on 10/24/2022 09/12/21   Love, Evlyn Kanner, PA-C  alum & mag hydroxide-simeth (MAALOX/MYLANTA) 200-200-20 MG/5ML suspension Take 30 mLs by mouth 3 (three) times daily. Patient not taking: Reported on 10/24/2022 10/15/22   Kendell Bane, MD  camphor-menthol [facility]) lotion Apply topically as needed for itching. Patient not taking: Reported on 10/24/2022 09/12/21   Love, Evlyn Kanner, PA-C  carvedilol (COREG) 12.5 MG tablet Take 1 tablet by mouth 2 (two) times daily.    [provider]  famotidine (PEPCID) 40 MG tablet Take 1 tablet (40 mg total) by mouth at bedtime. Patient not taking: Reported on 10/24/2022 11/28/21   Malissa Hippo, MD  hydrALAZINE (APRESOLINE) 50 MG tablet Take 50 mg by mouth 2 (two) times daily. Do not take morning dose on Dialysis days. Monday,Wednesday and Friday    [provider]  loperamide (IMODIUM) 2 MG capsule Take 1 capsule (2 mg total) by mouth as needed for diarrhea or loose stools. Patient not taking: Reported on 10/24/2022 09/12/21   Jacquelynn Cree, PA-C  midodrine (PROAMATINE) 10 MG tablet Take 1 tablet (10 mg total)  by mouth every Monday, Wednesday, and Friday. 10/26/22   Sherryll Burger, Pratik D, DO  multivitamin (RENA-VIT) TABS tablet Take 1 tablet by mouth daily. One daily    [provider]  traZODone (DESYREL) 100 MG tablet Take 100 mg by mouth at bedtime. 07/07/22   [provider]      Allergies    Cefepime, Morphine, and Sulfa antibiotics    Review of Systems   Review of Systems  Respiratory:  Positive for shortness of breath.   All other systems reviewed and are negative.   Physical Exam Updated Vital Signs BP (!) 162/79   Pulse 82   Temp 97.7 F (36.5 C) (Oral)   Resp (!) 33   Ht 4\' 11"  (1.499 m)   Wt 43.1 kg   SpO2 99%   BMI 19.19 kg/m  Physical Exam Vitals and nursing note reviewed.  Constitutional:      Appearance: She is well-developed.  HENT:     Head: Normocephalic and atraumatic.     Mouth/Throat:     Mouth: Mucous membranes are moist.     Pharynx: Oropharynx is clear.  Eyes:     Extraocular Movements: Extraocular movements intact.     Pupils: Pupils are equal, round, and reactive to light.  Cardiovascular:     Rate and Rhythm: Normal rate and regular rhythm.  Pulmonary:  Effort: Tachypnea present.  Abdominal:     General: Bowel sounds are normal.     Palpations: Abdomen is soft.  Musculoskeletal:        General: Normal range of motion.     Cervical back: Normal range of motion and neck supple.  Skin:    General: Skin is warm.     Capillary Refill: Capillary refill takes less than 2 seconds.  Neurological:     General: No focal deficit present.     Mental Status: She is alert and oriented to person, place, and time.  Psychiatric:        Mood and Affect: Mood normal.        Behavior: Behavior normal.     ED Results / Procedures / Treatments   Labs (all labs ordered are listed, but only abnormal results are displayed) Labs Reviewed  BASIC METABOLIC PANEL - Abnormal; Notable for the following components:      Result Value   Sodium 132 (*)     Chloride 92 (*)    BUN 46 (*)    Creatinine, Ser 5.68 (*)    Calcium 8.1 (*)    GFR, Estimated 7 (*)    All other components within normal limits  BRAIN NATRIURETIC PEPTIDE - Abnormal; Notable for the following components:   B Natriuretic Peptide >4,500.0 (*)    All other components within normal limits  CBC WITH DIFFERENTIAL/PLATELET - Abnormal; Notable for the following components:   WBC 11.3 (*)    RBC 3.10 (*)    Hemoglobin 9.5 (*)    HCT 29.1 (*)    Neutro Abs 9.9 (*)    Lymphs Abs 0.5 (*)    All other components within normal limits  TROPONIN I (HIGH SENSITIVITY) - Abnormal; Notable for the following components:   Troponin I (High Sensitivity) 595 (*)    All other components within normal limits    EKG EKG Interpretation  Date/Time:  Sunday January 06 2023 08:59:43 EDT Ventricular Rate:  95 PR Interval:  139 QRS Duration: 93 QT Interval:  371 QTC Calculation: 467 R Axis:   -26 Text Interpretation: Normal sinus rhythm Ventricular trigeminy Borderline left axis deviation Nonspecific T abnormalities, lateral leads +PVCs Confirmed by Jacalyn Lefevre [phone]) on 01/06/2023 9:35:44 AM  Radiology DG Chest Portable 1 View  Result Date: 01/06/2023 CLINICAL DATA:  Short of breath EXAM: PORTABLE CHEST 1 VIEW COMPARISON:  Prior chest x-ray 10/24/2022 FINDINGS: Cardiomegaly with pulmonary vascular congestion extensive bronchitic changes. Patchy foci of increased interstitial airspace opacity present scattered throughout the right and left lung. Probable small bilateral pleural effusions and associated bibasilar atelectasis. Atherosclerotic calcifications present in the transverse aorta. No acute osseous abnormality. IMPRESSION: 1. Cardiomegaly with pulmonary vascular congestion bordering on mild edema. 2. Multifocal patchy airspace opacities bilaterally. Differential considerations include areas of asymmetric pulmonary edema versus multifocal pneumonia including viral pneumonia. 3. Small  bilateral pleural effusions and associated bibasilar atelectasis. 4. Aortic atherosclerotic vascular calcifications. Electronically Signed   By: Malachy Moan M.D.   On: 01/06/2023 10:03    Procedures Procedures    Medications Ordered in ED Medications - No data to display  ED Course/ Medical Decision Making/ A&P                             Medical Decision Making Amount and/or Complexity of Data Reviewed Labs: ordered. Radiology: ordered.  Risk Decision regarding hospitalization.   This patient presents to the  ED for concern of sob, this involves an extensive number of treatment options, and is a complaint that carries with it a high risk of complications and morbidity.  The differential diagnosis includes chf, pna, pe   Co morbidities that complicate the patient evaluation  ESRD on HD (MWF), htn, gout, hld, gerd, and CHF   Additional history obtained:  Additional history obtained from epic chart review External records from outside source obtained and reviewed including family   Lab Tests:  I Ordered, and personally interpreted labs.  The pertinent results include:  trop elevated at 595 (198 in Dec), bmp with bun 46 and cr 5.68 (chronic), k ok, BNP over 4500 (chronic), cbc with mild anemia with hgb 9.5 (chronic)   Imaging Studies ordered:  I ordered imaging studies including cxr  I independently visualized and interpreted imaging which showed  Cardiomegaly with pulmonary vascular congestion bordering on mild  edema.  2. Multifocal patchy airspace opacities bilaterally. Differential  considerations include areas of asymmetric pulmonary edema versus  multifocal pneumonia including viral pneumonia.  3. Small bilateral pleural effusions and associated bibasilar  atelectasis.  4. Aortic atherosclerotic vascular calcifications.   I agree with the radiologist interpretation   Cardiac Monitoring:  The patient was maintained on a cardiac monitor.  I personally  viewed and interpreted the cardiac monitored which showed an underlying rhythm of: nsr   Medicines ordered and prescription drug management:   I have reviewed the patients home medicines and have made adjustments as needed   Test Considered:  cxr   Critical Interventions:  oxygen   Consultations Obtained:  I requested consultation with the nephrologist (Dr. Allena Katz),  and discussed lab and imaging findings as well as pertinent plan - they recommend dialysis Pt d/w Dr. Sherryll Burger ([facility]) for admission.   Problem List / ED Course:  Dyspnea:  likely due to volume overload and need for dialysis. Doubt pna.  No fever or cough.  She does not urinate, so lasix won't be helpful. Elevated troponin:  likely due to sob.  She does not have cp.   Reevaluation:  After the interventions noted above, I reevaluated the patient and found that they have :improved   Social Determinants of Health:  Lives at home   Dispostion:  After consideration of the diagnostic results and the patients response to treatment, I feel that the patent would benefit from admission.    CRITICAL CARE Performed by: Jacalyn Lefevre   Total critical care time: 30 minutes  Critical care time was exclusive of separately billable procedures and treating other patients.  Critical care was necessary to treat or prevent imminent or life-threatening deterioration.  Critical care was time spent personally by me on the following activities: development of treatment plan with patient and/or surrogate as well as nursing, discussions with consultants, evaluation of patient's response to treatment, examination of patient, obtaining history from patient or surrogate, ordering and performing treatments and interventions, ordering and review of laboratory studies, ordering and review of radiographic studies, pulse oximetry and re-evaluation of patient's condition.         Final Clinical Impression(s) / ED Diagnoses Final  diagnoses:  ESRD on hemodialysis (HCC)  Acute on chronic congestive heart failure, unspecified heart failure type [facility])    Rx / DC Orders ED Discharge Orders     None         Jacalyn Lefevre, MD 01/06/23 1036

## 2023-01-06 NOTE — Plan of Care (Signed)

## 2023-01-07 LAB — BASIC METABOLIC PANEL
Anion gap: 12 (ref 5–15)
BUN: 26 mg/dL — ABNORMAL HIGH (ref 8–23)
CO2: 28 mmol/L (ref 22–32)
Calcium: 8.2 mg/dL — ABNORMAL LOW (ref 8.9–10.3)
Chloride: 94 mmol/L — ABNORMAL LOW (ref 98–111)
Creatinine, Ser: 3.34 mg/dL — ABNORMAL HIGH (ref 0.44–1.00)
GFR, Estimated: 13 mL/min — ABNORMAL LOW (ref >60)
Glucose, Bld: 89 mg/dL (ref 70–99)
Potassium: 3.9 mmol/L (ref 3.5–5.1)
Sodium: 134 mmol/L — ABNORMAL LOW (ref 135–145)

## 2023-01-07 LAB — MAGNESIUM: Magnesium: 1.9 mg/dL (ref 1.7–2.4)

## 2023-01-07 LAB — CBC
HCT: 25.7 % — ABNORMAL LOW (ref 36.0–46.0)
Hemoglobin: 8.3 g/dL — ABNORMAL LOW (ref 12.0–15.0)
MCH: 30.7 pg (ref 26.0–34.0)
MCHC: 32.3 g/dL (ref 30.0–36.0)
MCV: 95.2 fL (ref 80.0–100.0)
Platelets: 203 10*3/uL (ref 150–400)
RBC: 2.7 MIL/uL — ABNORMAL LOW (ref 3.87–5.11)
RDW: 15 % (ref 11.5–15.5)
WBC: 7.1 10*3/uL (ref 4.0–10.5)
nRBC: 0 % (ref 0.0–0.2)

## 2023-01-07 LAB — HEPATITIS B SURFACE ANTIGEN: Hepatitis B Surface Ag: NONREACTIVE

## 2023-01-07 NOTE — Progress Notes (Signed)
It appears that patient is for discharge today and will go to her OP HD unit for her regular treatment later today   Cecille Aver

## 2023-01-07 NOTE — Progress Notes (Signed)
Patient had 5 beat run of vtach with trigeminy.  Asymptomatic.  Hospitalist notified and felt it would be better to cancel discharge and hold patient one more night for tele monitoring and AM lab work.  Family and charge nurse notified.  Family agreeable to plan.

## 2023-01-07 NOTE — Progress Notes (Signed)
Patient seen and evaluated this a.m. and had discharge delayed due to some NSVT with 5 beats noted in the evening.  No further issues or arrhythmias noted this morning and she is in stable condition for discharge.  She will continue her routine hemodialysis as scheduled today.  Please refer to discharge summary dictated 6/16 for full details.  Total care time: 15 minutes.

## 2023-01-08 ENCOUNTER — Encounter: Payer: Self-pay | Admitting: Internal Medicine

## 2023-01-08 ENCOUNTER — Ambulatory Visit: Payer: Medicare Other | Attending: Internal Medicine | Admitting: Internal Medicine

## 2023-01-08 VITALS — BP 160/50 | HR 69 | Ht 59.0 in | Wt 96.2 lb

## 2023-01-08 DIAGNOSIS — I5032 Chronic diastolic (congestive) heart failure: Secondary | ICD-10-CM

## 2023-01-08 DIAGNOSIS — I5030 Unspecified diastolic (congestive) heart failure: Secondary | ICD-10-CM | POA: Insufficient documentation

## 2023-01-08 LAB — HEPATITIS B SURFACE ANTIBODY, QUANTITATIVE: Hep B S AB Quant (Post): <3.5 m[IU]/mL — ABNORMAL LOW (ref >9.9)

## 2023-01-08 NOTE — Progress Notes (Signed)
Cardiology Office Note  Date: 01/08/2023   ID: Felicia Williams, DOB [birth year], MRN [id number]  PCP:  Rebekah Chesterfield, NP  Cardiologist:  Marjo Bicker, MD Electrophysiologist:  None   Reason for Office Visit: Follow-up of posthospitalization visit   History of Present Illness: Felicia Williams is a 82 y.o. female known to have ESRD DD MWF, HTN is here for follow-up visit.  Patient was initially referred to cardiology clinic in 2017 for palpitations, DOE and intermittent leg swelling for which nuclear stress test was performed and showed no evidence of ischemia. Echo showed normal LVEF and patient was discharged from the cardiology clinic. She is referred back to cardiology clinic for similar symptoms of DOE. Echo was performed on 07/12/2022 showed LVEF 50 to 55%, grade 1 diastolic dysfunction and mild MR. NM stress test from 08/02/2022 showed no evidence of ischemia and nuclear LVEF was found to be 49%.  She has had multiple hospitalizations for volume overload requiring urgent hemodialysis.  She was recently discharged from the [facility] hospital on 01/06/2023. She does not make any urine. Currently undergoes dialysis 3 days a week. Denies any angina. Denies any DOE as of now but has intermittent DOE requiring hospital admissions for HD. No dizziness, syncope, leg swelling. She does have family history of ASCVD (her brothers have PCI/CABG).  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 Karilyn Cota.   GERD (gastroesophageal reflux disease)    Glomerulonephritis    Gout    Mixed hyperlipidemia    Spondylosis     Past Surgical History:  Procedure Laterality Date   APPENDECTOMY     AV FISTULA  PLACEMENT Left 03/20/2022   Procedure: INSERTION OF LEFT UPPER ARM ARTERIOVENOUS (AV) GORE-TEX GRAFT;  Surgeon: Larina Earthly, MD;  Location: [facility];  Service: Vascular;  Laterality: Left;   CATARACT EXTRACTION     COLONOSCOPY N/A 12/28/2015   Procedure: COLONOSCOPY;  Surgeon: Malissa Hippo, MD;  Location: [facility];  Service: Endoscopy;  Laterality: N/A;  815   ESOPHAGOGASTRODUODENOSCOPY N/A 11/16/2020   Procedure: ESOPHAGOGASTRODUODENOSCOPY (EGD);  Surgeon: Malissa Hippo, MD;  Location: [facility];  Service: Endoscopy;  Laterality: N/A;  1:15   EXCHANGE OF A DIALYSIS CATHETER N/A 03/20/2022   Procedure: EXCHANGE OF A DIALYSIS CATHETER;  Surgeon: Larina Earthly, MD;  Location: [facility];  Service: Vascular;  Laterality: N/A;   INSERTION OF DIALYSIS CATHETER Right 09/30/2021   Procedure: INSERTION OF TUNNELED DIALYSIS CATHETER;  Surgeon: Maeola Harman, MD;  Location: [facility] OR;  Service: Vascular;  Laterality: Right;   IR FLUORO GUIDE CV LINE RIGHT  01/16/2019   IR REMOVAL TUN CV CATH W/O FL  07/01/2019   IR US GUIDE VASC ACCESS RIGHT  01/16/2019   POLYPECTOMY  11/16/2020   Procedure: POLYPECTOMY;  Surgeon: Malissa Hippo, MD;  Location: [facility];  Service: Endoscopy;;  gastric   REMOVAL OF A DIALYSIS CATHETER  09/30/2021   Procedure: REMOVAL OF A PERITONEAL DIALYSIS CATHETER;  Surgeon: Maeola Harman, MD;  Location: [facility] OR;  Service: Vascular;;   REMOVAL OF A DIALYSIS CATHETER N/A 05/22/2022  Procedure: MINOR REMOVAL OF A TUNNELED DIALYSIS CATHETER;  Surgeon: Larina Earthly, MD;  Location: [facility];  Service: Vascular;  Laterality: N/A;   TUBAL LIGATION      Current Outpatient Medications  Medication Sig Dispense Refill   acetaminophen (TYLENOL) 325 MG tablet Take 2 tablets (650 mg total) by mouth every 6 (six) hours as needed for mild pain.     camphor-menthol (SARNA) lotion Apply topically as needed for itching. 222 mL 0   carvedilol (COREG) 12.5 MG tablet Take 1  tablet by mouth 2 (two) times daily.     cephALEXin (KEFLEX) 500 MG capsule Take 500 mg by mouth every 12 (twelve) hours.     famotidine (PEPCID) 40 MG tablet Take 1 tablet (40 mg total) by mouth at bedtime. (Patient taking differently: Take 40 mg by mouth as needed for heartburn or indigestion.) 30 tablet 1   hydrALAZINE (APRESOLINE) 50 MG tablet Take 50 mg by mouth 2 (two) times daily. Do not take morning dose on Dialysis days. Monday,Wednesday and Friday     HYDROcodone-acetaminophen (NORCO) 10-325 MG tablet Take 1 tablet by mouth 3 (three) times daily as needed.     loperamide (IMODIUM) 2 MG capsule Take 1 capsule (2 mg total) by mouth as needed for diarrhea or loose stools. 30 capsule 0   midodrine (PROAMATINE) 10 MG tablet Take 1 tablet (10 mg total) by mouth every Monday, Wednesday, and Friday. 30 tablet 1   multivitamin (RENA-VIT) TABS tablet Take 1 tablet by mouth daily. One daily     nitroGLYCERIN (NITROSTAT) 0.4 MG SL tablet Place 0.4 mg under the tongue every 5 (five) minutes as needed for chest pain.     traZODone (DESYREL) 100 MG tablet Take 100 mg by mouth at bedtime.     No current facility-administered medications for this visit.   Allergies:  Cefepime, Morphine, and Sulfa antibiotics   Social History: The patient  reports that she has never smoked. She has never been exposed to tobacco smoke. She has never used smokeless tobacco. She reports that she does not drink alcohol and does not use drugs.   Family History: The patient's family history includes CAD in her father; Diabetes Mellitus II in her father; Heart attack in her father; Hypertension in her father; Lupus in her brother.   ROS:  Please see the history of present illness. Otherwise, complete review of systems is positive for none.  All other systems are reviewed and negative.   Physical Exam: VS:  BP (!) 160/50   Pulse 69   Ht 4\' 11"  (1.499 m)   Wt 96 lb 3.2 oz (43.6 kg)   SpO2 96%   BMI 19.43 kg/m , BMI Body  mass index is 19.43 kg/m.  Wt Readings from Last 3 Encounters:  01/08/23 96 lb 3.2 oz (43.6 kg)  01/06/23 95 lb 14.4 oz (43.5 kg)  10/24/22 97 lb 3.6 oz (44.1 kg)    General: Patient appears comfortable at rest. HEENT: Conjunctiva and lids normal, oropharynx clear with moist mucosa. Neck: Supple, no elevated JVP or carotid bruits, no thyromegaly. Lungs: Clear to auscultation, nonlabored breathing at rest. Cardiac: Regular rate and rhythm, no S3 or significant systolic murmur, no pericardial rub. Abdomen: Soft, nontender, no hepatomegaly, bowel sounds present, no guarding or rebound. Extremities: No pitting edema Skin: Warm and dry. Musculoskeletal: No kyphosis. Neuropsychiatric: Alert and oriented x3, affect grossly appropriate.  ECG:  NSR  Recent Labwork: 10/15/2022: ALT 19; AST 31 01/06/2023: B Natriuretic Peptide >  4,500.0 01/07/2023: BUN 26; Creatinine, Ser 3.34; Hemoglobin 8.3; Magnesium 1.9; Platelets 203; Potassium 3.9; Sodium 134  No results found for: "CHOL", "TRIG", "HDL", "CHOLHDL", "VLDL", "LDLCALC", "LDLDIRECT"  Other Studies Reviewed Today: Echo from 06/2022 LVEF 50 to 55% Grade 1 diastolic dysfunction Mild MR  Assessment and Plan: Patient is 82 year old F known to have ESRD DD, HTN is here for follow-up visit.  # HFpEF exacerbations secondary to ESRD DD -Echocardiogram from 12/23 showed normal LVEF, G1 DD and trivial AR. NM stress test from 2017 and 2024 showed no evidence of ischemia.  Patient has had multiple hospitalizations for acute on chronic HFpEF likely secondary to volume overload from ESRD DD.  She does not make any urine. Cannot prescribe diuretics. Denies any angina. No indication for further cardiac testing or ischemia evaluation at this time.  Instructed and strongly encouraged patient to follow-up with her nephrologist regarding frequency and duration of hemodialysis sessions.  # HTN, controlled -Continue Coreg 12.5 mg twice daily and hydralazine 50  mg twice daily.  HTN per PCP.   I have spent a total of 33 minutes with patient reviewing chart, EKGs, labs and examining patient as well as establishing an assessment and plan that was discussed with the patient.  > 50% of time was spent in direct patient care.      Medication Adjustments/Labs and Tests Ordered: Current medicines are reviewed at length with the patient today.  Concerns regarding medicines are outlined above.   Tests Ordered: No orders of the defined types were placed in this encounter.   Medication Changes: No orders of the defined types were placed in this encounter.   Disposition:  Follow up PRN  Signed Denetria Luevanos Verne Spurr, MD, 01/08/2023 9:13 AM    [facility] [address] [place], [place], [place] [address]

## 2023-01-08 NOTE — Patient Instructions (Signed)
Medication Instructions:  Your physician recommends that you continue on your current medications as directed. Please refer to the Current Medication list given to you today.    Labwork: None  Testing/Procedures: None  Follow-Up: Your physician recommends that you schedule a follow-up appointment in: As needed.   Any Other Special Instructions Will Be Listed Below (If Applicable).     If you need a refill on your cardiac medications before your next appointment, please call your pharmacy.   

## 2023-01-09 ENCOUNTER — Encounter: Payer: Self-pay | Admitting: Vascular Surgery

## 2023-01-09 ENCOUNTER — Ambulatory Visit: Payer: Medicare Other | Admitting: Vascular Surgery

## 2023-01-09 VITALS — BP 191/66 | HR 66 | Temp 97.3°F | Ht 59.0 in | Wt 99.2 lb

## 2023-01-09 DIAGNOSIS — N186 End stage renal disease: Secondary | ICD-10-CM

## 2023-01-09 NOTE — Progress Notes (Signed)
[facility] [place]  Patient name: Felicia Williams MRN: [id number] DOB: [birth year] Sex: female  REASON FOR VISIT: Evaluation left upper arm AV Gore-Tex graft  HPI: Felicia Williams is a 82 y.o. female here today for evaluation.  She is well-known to me from placement of a left upper arm AV Gore-Tex graft on 03/20/2022.  She has dialysis at [facility].  There was some concern regarding prolonged bleeding and possible Naoki Migliaccio aneurysm formation and she is seen today for follow-up.  She has had several recent hospitalizations most recently as earlier this week for congestive heart failure.  She looks great today.  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 Karilyn Cota.   GERD (gastroesophageal reflux disease)    Glomerulonephritis    Gout    Mixed hyperlipidemia    Spondylosis     Family History  Problem Relation Age of Onset   CAD Father    Heart attack Father    Diabetes Mellitus II Father    Hypertension Father    Lupus Brother     SOCIAL HISTORY: Social History   Tobacco Use   Smoking status: Never    Passive exposure: Never   Smokeless tobacco: Never  Substance Use Topics   Alcohol use: No    Allergies  Allergen Reactions   Cefepime Other (See Comments)    Feb 2023 Encephalopathy with questionable seizures. Seems to be tolerating cefazolin    Morphine Other (See Comments)    "Dry heaving like crazy"   Sulfa Antibiotics Other (See Comments)    Shut pt's kidneys down    Current Outpatient Medications  Medication Sig Dispense Refill   acetaminophen (TYLENOL) 325 MG tablet Take 2 tablets (650 mg total) by mouth every 6  (six) hours as needed for mild pain.     camphor-menthol (SARNA) lotion Apply topically as needed for itching. 222 mL 0   carvedilol (COREG) 12.5 MG tablet Take 1 tablet by mouth 2 (two) times daily.     cephALEXin (KEFLEX) 500 MG capsule Take 500 mg by mouth every 12 (twelve) hours.     famotidine (PEPCID) 40 MG tablet Take 1 tablet (40 mg total) by mouth at bedtime. (Patient taking differently: Take 40 mg by mouth as needed for heartburn or indigestion.) 30 tablet 1   hydrALAZINE (APRESOLINE) 50 MG tablet Take 50 mg by mouth 2 (two) times daily. Do not take morning dose on Dialysis days. Monday,Wednesday and Friday     loperamide (IMODIUM) 2 MG capsule Take 1 capsule (2 mg total) by mouth as needed for diarrhea or loose stools. 30 capsule 0   midodrine (PROAMATINE) 10 MG tablet Take 1 tablet (10 mg total) by mouth every Monday, Wednesday, and Friday. 30 tablet 1   multivitamin (RENA-VIT) TABS tablet Take 1 tablet by mouth daily. One daily     nitroGLYCERIN (NITROSTAT) 0.4 MG SL tablet Place 0.4 mg under the tongue every 5 (five) minutes as needed for chest pain.     oxyCODONE (OXY IR/ROXICODONE) 5 MG immediate release tablet Take 5 mg by mouth 3 (three) times daily as needed.  traZODone (DESYREL) 100 MG tablet Take 100 mg by mouth at bedtime.     No current facility-administered medications for this visit.    REVIEW OF SYSTEMS:  [X]  denotes positive finding, [ ]  denotes negative finding Cardiac  Comments:  Chest pain or chest pressure:    Shortness of breath upon exertion: x   Short of breath when lying flat:    Irregular heart rhythm:        Vascular    Pain in calf, thigh, or hip brought on by ambulation:    Pain in feet at night that wakes you up from your sleep:     Blood clot in your veins:    Leg swelling:           PHYSICAL EXAM: Vitals:   01/09/23 0924  BP: (!) 191/66  Pulse: 66  Temp: (!) 97.3 F (36.3 C)  SpO2: 99%  Weight: 99 lb 3.2 oz (45 kg)  Height: 4\' 11"   (1.499 m)    GENERAL: The patient is a well-nourished female, in no acute distress. The vital signs are documented above. CARDIOVASCULAR: 2+ left radial pulse.  Her left upper arm AV Gore-Tex graft looks quite good.  The dialysis staff is doing a very nice job of rotating access sites.  I do not see any evidence of skin breakdown or ulceration.  I do not see any evidence of aneurysmal formation in her graft itself PULMONARY: There is good air exchange  MUSCULOSKELETAL: There are no major deformities or cyanosis. NEUROLOGIC: No focal weakness or paresthesias are detected. SKIN: There are no ulcers or rashes noted. PSYCHIATRIC: The patient has a normal affect.  DATA:  None  MEDICAL ISSUES: Stable status post left upper arm AV graft placement in August 2023.  I do not see any mechanical difficulties.  I would continue using her access as it is currently being done    Larina Earthly, MD FACS [facility] [place] Office Tel [phone]  Note: Portions of this report may have been transcribed using voice recognition software.  Every effort has been made to ensure accuracy; however, inadvertent computerized transcription errors may still be present.

## 2023-01-28 ENCOUNTER — Emergency Department (HOSPITAL_COMMUNITY): Payer: Medicare Other

## 2023-01-28 ENCOUNTER — Encounter (HOSPITAL_COMMUNITY): Payer: Self-pay | Admitting: Emergency Medicine

## 2023-01-28 ENCOUNTER — Telehealth: Payer: Self-pay | Admitting: Internal Medicine

## 2023-01-28 ENCOUNTER — Other Ambulatory Visit: Payer: Self-pay

## 2023-01-28 ENCOUNTER — Inpatient Hospital Stay (HOSPITAL_COMMUNITY)
Admission: EM | Admit: 2023-01-28 | Discharge: 2023-02-05 | DRG: 853 | Disposition: A | Discharge status: Skilled Nursing Facility | Payer: Medicare Other | Attending: Internal Medicine | Admitting: Internal Medicine

## 2023-01-28 DIAGNOSIS — N186 End stage renal disease: Secondary | ICD-10-CM | POA: Diagnosis not present

## 2023-01-28 DIAGNOSIS — Y832 Surgical operation with anastomosis, bypass or graft as the cause of abnormal reaction of the patient, or of later complication, without mention of misadventure at the time of the procedure: Secondary | ICD-10-CM | POA: Diagnosis present

## 2023-01-28 DIAGNOSIS — L298 Other pruritus: Secondary | ICD-10-CM | POA: Diagnosis not present

## 2023-01-28 DIAGNOSIS — F1120 Opioid dependence, uncomplicated: Secondary | ICD-10-CM | POA: Diagnosis present

## 2023-01-28 DIAGNOSIS — N2581 Secondary hyperparathyroidism of renal origin: Secondary | ICD-10-CM | POA: Diagnosis present

## 2023-01-28 DIAGNOSIS — Z1152 Encounter for screening for COVID-19: Secondary | ICD-10-CM

## 2023-01-28 DIAGNOSIS — T402X5A Adverse effect of other opioids, initial encounter: Secondary | ICD-10-CM | POA: Diagnosis present

## 2023-01-28 DIAGNOSIS — R1310 Dysphagia, unspecified: Secondary | ICD-10-CM | POA: Diagnosis present

## 2023-01-28 DIAGNOSIS — J189 Pneumonia, unspecified organism: Secondary | ICD-10-CM | POA: Diagnosis present

## 2023-01-28 DIAGNOSIS — G319 Degenerative disease of nervous system, unspecified: Secondary | ICD-10-CM | POA: Diagnosis present

## 2023-01-28 DIAGNOSIS — J9621 Acute and chronic respiratory failure with hypoxia: Secondary | ICD-10-CM | POA: Diagnosis present

## 2023-01-28 DIAGNOSIS — G8929 Other chronic pain: Secondary | ICD-10-CM | POA: Diagnosis present

## 2023-01-28 DIAGNOSIS — Z681 Body mass index (BMI) 19 or less, adult: Secondary | ICD-10-CM

## 2023-01-28 DIAGNOSIS — G928 Other toxic encephalopathy: Secondary | ICD-10-CM | POA: Diagnosis present

## 2023-01-28 DIAGNOSIS — Z79899 Other long term (current) drug therapy: Secondary | ICD-10-CM

## 2023-01-28 DIAGNOSIS — H5789 Other specified disorders of eye and adnexa: Secondary | ICD-10-CM | POA: Diagnosis not present

## 2023-01-28 DIAGNOSIS — T82858A Stenosis of vascular prosthetic devices, implants and grafts, initial encounter: Secondary | ICD-10-CM | POA: Diagnosis present

## 2023-01-28 DIAGNOSIS — Z6820 Body mass index (BMI) 20.0-20.9, adult: Secondary | ICD-10-CM

## 2023-01-28 DIAGNOSIS — F32A Depression, unspecified: Secondary | ICD-10-CM | POA: Diagnosis present

## 2023-01-28 DIAGNOSIS — J90 Pleural effusion, not elsewhere classified: Secondary | ICD-10-CM | POA: Diagnosis present

## 2023-01-28 DIAGNOSIS — R9431 Abnormal electrocardiogram [ECG] [EKG]: Secondary | ICD-10-CM | POA: Diagnosis present

## 2023-01-28 DIAGNOSIS — A419 Sepsis, unspecified organism: Principal | ICD-10-CM | POA: Diagnosis present

## 2023-01-28 DIAGNOSIS — R509 Fever, unspecified: Secondary | ICD-10-CM

## 2023-01-28 DIAGNOSIS — M549 Dorsalgia, unspecified: Secondary | ICD-10-CM | POA: Diagnosis present

## 2023-01-28 DIAGNOSIS — I10 Essential (primary) hypertension: Secondary | ICD-10-CM | POA: Diagnosis present

## 2023-01-28 DIAGNOSIS — Z8711 Personal history of peptic ulcer disease: Secondary | ICD-10-CM

## 2023-01-28 DIAGNOSIS — D631 Anemia in chronic kidney disease: Secondary | ICD-10-CM | POA: Diagnosis present

## 2023-01-28 DIAGNOSIS — R6520 Severe sepsis without septic shock: Secondary | ICD-10-CM | POA: Diagnosis present

## 2023-01-28 DIAGNOSIS — I120 Hypertensive chronic kidney disease with stage 5 chronic kidney disease or end stage renal disease: Secondary | ICD-10-CM | POA: Diagnosis present

## 2023-01-28 DIAGNOSIS — T361X5A Adverse effect of cephalosporins and other beta-lactam antibiotics, initial encounter: Secondary | ICD-10-CM | POA: Diagnosis not present

## 2023-01-28 DIAGNOSIS — D509 Iron deficiency anemia, unspecified: Secondary | ICD-10-CM | POA: Diagnosis present

## 2023-01-28 DIAGNOSIS — J181 Lobar pneumonia, unspecified organism: Secondary | ICD-10-CM | POA: Insufficient documentation

## 2023-01-28 DIAGNOSIS — G9341 Metabolic encephalopathy: Secondary | ICD-10-CM | POA: Diagnosis present

## 2023-01-28 DIAGNOSIS — Z8269 Family history of other diseases of the musculoskeletal system and connective tissue: Secondary | ICD-10-CM

## 2023-01-28 DIAGNOSIS — Z992 Dependence on renal dialysis: Secondary | ICD-10-CM

## 2023-01-28 DIAGNOSIS — Z885 Allergy status to narcotic agent status: Secondary | ICD-10-CM

## 2023-01-28 DIAGNOSIS — L89151 Pressure ulcer of sacral region, stage 1: Secondary | ICD-10-CM | POA: Diagnosis present

## 2023-01-28 DIAGNOSIS — Z8249 Family history of ischemic heart disease and other diseases of the circulatory system: Secondary | ICD-10-CM

## 2023-01-28 DIAGNOSIS — Z87448 Personal history of other diseases of urinary system: Secondary | ICD-10-CM

## 2023-01-28 DIAGNOSIS — Y92009 Unspecified place in unspecified non-institutional (private) residence as the place of occurrence of the external cause: Secondary | ICD-10-CM

## 2023-01-28 DIAGNOSIS — E876 Hypokalemia: Secondary | ICD-10-CM | POA: Diagnosis present

## 2023-01-28 DIAGNOSIS — T4275XA Adverse effect of unspecified antiepileptic and sedative-hypnotic drugs, initial encounter: Secondary | ICD-10-CM | POA: Diagnosis present

## 2023-01-28 DIAGNOSIS — Z833 Family history of diabetes mellitus: Secondary | ICD-10-CM

## 2023-01-28 DIAGNOSIS — Z888 Allergy status to other drugs, medicaments and biological substances status: Secondary | ICD-10-CM

## 2023-01-28 DIAGNOSIS — Z882 Allergy status to sulfonamides status: Secondary | ICD-10-CM

## 2023-01-28 DIAGNOSIS — K219 Gastro-esophageal reflux disease without esophagitis: Secondary | ICD-10-CM | POA: Diagnosis present

## 2023-01-28 DIAGNOSIS — E782 Mixed hyperlipidemia: Secondary | ICD-10-CM | POA: Diagnosis present

## 2023-01-28 DIAGNOSIS — F05 Delirium due to known physiological condition: Secondary | ICD-10-CM | POA: Diagnosis not present

## 2023-01-28 DIAGNOSIS — F419 Anxiety disorder, unspecified: Secondary | ICD-10-CM | POA: Diagnosis present

## 2023-01-28 DIAGNOSIS — R627 Adult failure to thrive: Secondary | ICD-10-CM | POA: Diagnosis present

## 2023-01-28 DIAGNOSIS — R531 Weakness: Secondary | ICD-10-CM

## 2023-01-28 MED ORDER — LORAZEPAM 1 MG PO TABS
1.0000 mg | ORAL_TABLET | Freq: Once | ORAL | Status: AC
  Administered 2023-01-29: 1 mg via ORAL
  Filled 2023-01-28: qty 1

## 2023-01-28 MED ORDER — ACETAMINOPHEN 325 MG PO TABS
650.0000 mg | ORAL_TABLET | Freq: Once | ORAL | Status: AC
  Administered 2023-01-29: 650 mg via ORAL
  Filled 2023-01-28: qty 2

## 2023-01-28 NOTE — Telephone Encounter (Signed)
Pt's daughter would like a callback regarding pt's fluid buildup to the point that she cannot lay down nor walk. She states that pt is currently at Dialysis and they are requesting permission to increase the amount of fluid to take. Please advise

## 2023-01-28 NOTE — ED Triage Notes (Signed)
Pt to ed pov c/o chronic back pain. Pt takes pain medication at home but pt states they knock her out instead of treating the pain. Pt had dialysis today and has CHF. Pt wears chronic 3L [place] and endorses fever in triage

## 2023-01-29 DIAGNOSIS — G9341 Metabolic encephalopathy: Secondary | ICD-10-CM

## 2023-01-29 DIAGNOSIS — E876 Hypokalemia: Secondary | ICD-10-CM

## 2023-01-29 DIAGNOSIS — N186 End stage renal disease: Secondary | ICD-10-CM | POA: Diagnosis not present

## 2023-01-29 DIAGNOSIS — R531 Weakness: Secondary | ICD-10-CM

## 2023-01-29 DIAGNOSIS — J189 Pneumonia, unspecified organism: Secondary | ICD-10-CM | POA: Diagnosis present

## 2023-01-29 DIAGNOSIS — R9431 Abnormal electrocardiogram [ECG] [EKG]: Secondary | ICD-10-CM | POA: Insufficient documentation

## 2023-01-29 DIAGNOSIS — K2100 Gastro-esophageal reflux disease with esophagitis, without bleeding: Secondary | ICD-10-CM

## 2023-01-29 DIAGNOSIS — I10 Essential (primary) hypertension: Secondary | ICD-10-CM

## 2023-01-29 DIAGNOSIS — Z992 Dependence on renal dialysis: Secondary | ICD-10-CM

## 2023-01-29 LAB — COMPREHENSIVE METABOLIC PANEL
ALT: 18 U/L (ref 0–44)
ALT: 16 U/L (ref 0–44)
AST: 28 U/L (ref 15–41)
AST: 24 U/L (ref 15–41)
Albumin: 2.4 g/dL — ABNORMAL LOW (ref 3.5–5.0)
Albumin: 2 g/dL — ABNORMAL LOW (ref 3.5–5.0)
Alkaline Phosphatase: 92 U/L (ref 38–126)
Alkaline Phosphatase: 75 U/L (ref 38–126)
Anion gap: 12 (ref 5–15)
Anion gap: 12 (ref 5–15)
BUN: 28 mg/dL — ABNORMAL HIGH (ref 8–23)
BUN: 31 mg/dL — ABNORMAL HIGH (ref 8–23)
CO2: 28 mmol/L (ref 22–32)
CO2: 26 mmol/L (ref 22–32)
Calcium: 7.6 mg/dL — ABNORMAL LOW (ref 8.9–10.3)
Calcium: 7.4 mg/dL — ABNORMAL LOW (ref 8.9–10.3)
Chloride: 96 mmol/L — ABNORMAL LOW (ref 98–111)
Chloride: 98 mmol/L (ref 98–111)
Creatinine, Ser: 4.13 mg/dL — ABNORMAL HIGH (ref 0.44–1.00)
Creatinine, Ser: 4.46 mg/dL — ABNORMAL HIGH (ref 0.44–1.00)
GFR, Estimated: 10 mL/min — ABNORMAL LOW (ref >60)
GFR, Estimated: 9 mL/min — ABNORMAL LOW (ref >60)
Glucose, Bld: 90 mg/dL (ref 70–99)
Glucose, Bld: 83 mg/dL (ref 70–99)
Potassium: 3.2 mmol/L — ABNORMAL LOW (ref 3.5–5.1)
Potassium: 3 mmol/L — ABNORMAL LOW (ref 3.5–5.1)
Sodium: 136 mmol/L (ref 135–145)
Sodium: 136 mmol/L (ref 135–145)
Total Bilirubin: 0.5 mg/dL (ref 0.3–1.2)
Total Bilirubin: 0.7 mg/dL (ref 0.3–1.2)
Total Protein: 6.6 g/dL (ref 6.5–8.1)
Total Protein: 5.6 g/dL — ABNORMAL LOW (ref 6.5–8.1)

## 2023-01-29 LAB — CBC WITH DIFFERENTIAL/PLATELET
Abs Immature Granulocytes: 0.06 10*3/uL (ref 0.00–0.07)
Abs Immature Granulocytes: 0.06 10*3/uL (ref 0.00–0.07)
Basophils Absolute: 0 10*3/uL (ref 0.0–0.1)
Basophils Absolute: 0 10*3/uL (ref 0.0–0.1)
Basophils Relative: 0 %
Basophils Relative: 0 %
Eosinophils Absolute: 0.3 10*3/uL (ref 0.0–0.5)
Eosinophils Absolute: 0.4 10*3/uL (ref 0.0–0.5)
Eosinophils Relative: 3 %
Eosinophils Relative: 4 %
HCT: 25.9 % — ABNORMAL LOW (ref 36.0–46.0)
HCT: 22.4 % — ABNORMAL LOW (ref 36.0–46.0)
Hemoglobin: 8.2 g/dL — ABNORMAL LOW (ref 12.0–15.0)
Hemoglobin: 7.1 g/dL — ABNORMAL LOW (ref 12.0–15.0)
Immature Granulocytes: 1 %
Immature Granulocytes: 1 %
Lymphocytes Relative: 6 %
Lymphocytes Relative: 8 %
Lymphs Abs: 0.6 10*3/uL — ABNORMAL LOW (ref 0.7–4.0)
Lymphs Abs: 0.7 10*3/uL (ref 0.7–4.0)
MCH: 29.5 pg (ref 26.0–34.0)
MCH: 29.3 pg (ref 26.0–34.0)
MCHC: 31.7 g/dL (ref 30.0–36.0)
MCHC: 31.7 g/dL (ref 30.0–36.0)
MCV: 93.2 fL (ref 80.0–100.0)
MCV: 92.6 fL (ref 80.0–100.0)
Monocytes Absolute: 0.6 10*3/uL (ref 0.1–1.0)
Monocytes Absolute: 0.5 10*3/uL (ref 0.1–1.0)
Monocytes Relative: 5 %
Monocytes Relative: 5 %
Neutro Abs: 9.4 10*3/uL — ABNORMAL HIGH (ref 1.7–7.7)
Neutro Abs: 7.3 10*3/uL (ref 1.7–7.7)
Neutrophils Relative %: 85 %
Neutrophils Relative %: 82 %
Platelets: 305 10*3/uL (ref 150–400)
Platelets: 279 10*3/uL (ref 150–400)
RBC: 2.78 MIL/uL — ABNORMAL LOW (ref 3.87–5.11)
RBC: 2.42 MIL/uL — ABNORMAL LOW (ref 3.87–5.11)
RDW: 16.5 % — ABNORMAL HIGH (ref 11.5–15.5)
RDW: 16.3 % — ABNORMAL HIGH (ref 11.5–15.5)
WBC: 11 10*3/uL — ABNORMAL HIGH (ref 4.0–10.5)
WBC: 8.9 10*3/uL (ref 4.0–10.5)
nRBC: 0 % (ref 0.0–0.2)
nRBC: 0 % (ref 0.0–0.2)

## 2023-01-29 LAB — MAGNESIUM: Magnesium: 1.9 mg/dL (ref 1.7–2.4)

## 2023-01-29 LAB — SARS CORONAVIRUS 2 BY RT PCR: SARS Coronavirus 2 by RT PCR: NEGATIVE

## 2023-01-29 LAB — PROTIME-INR
INR: 1.2 (ref 0.8–1.2)
Prothrombin Time: 15.5 seconds — ABNORMAL HIGH (ref 11.4–15.2)

## 2023-01-29 LAB — LACTIC ACID, PLASMA: Lactic Acid, Venous: 0.9 mmol/L (ref 0.5–1.9)

## 2023-01-29 MED ORDER — ONDANSETRON HCL 4 MG PO TABS: 4.0000 mg | ORAL_TABLET | Freq: Four times a day (QID) | ORAL | Status: DC | PRN

## 2023-01-29 MED ORDER — CARVEDILOL 12.5 MG PO TABS
12.5000 mg | ORAL_TABLET | Freq: Two times a day (BID) | ORAL | Status: DC
  Administered 2023-01-29 – 2023-02-05 (×14): 12.5 mg via ORAL
  Filled 2023-01-29 (×15): qty 1

## 2023-01-29 MED ORDER — CHLORHEXIDINE GLUCONATE CLOTH 2 % EX PADS
6.0000 | MEDICATED_PAD | Freq: Every day | CUTANEOUS | Status: DC
  Administered 2023-01-29 – 2023-02-05 (×8): 6 via TOPICAL

## 2023-01-29 MED ORDER — HYDRALAZINE HCL 25 MG PO TABS
50.0000 mg | ORAL_TABLET | Freq: Two times a day (BID) | ORAL | Status: DC
  Administered 2023-01-29 – 2023-02-05 (×14): 50 mg via ORAL
  Filled 2023-01-29 (×15): qty 2

## 2023-01-29 MED ORDER — TRAZODONE HCL 50 MG PO TABS: 100.0000 mg | ORAL_TABLET | Freq: Every day | ORAL | Status: DC

## 2023-01-29 MED ORDER — POTASSIUM CHLORIDE 20 MEQ PO PACK
40.0000 meq | PACK | Freq: Once | ORAL | Status: AC
  Administered 2023-01-29: 40 meq via ORAL
  Filled 2023-01-29: qty 2

## 2023-01-29 MED ORDER — SODIUM CHLORIDE 0.9 % IV SOLN
2.0000 g | INTRAVENOUS | Status: DC
  Administered 2023-01-30 – 2023-01-31 (×3): 2 g via INTRAVENOUS
  Filled 2023-01-29 (×4): qty 20

## 2023-01-29 MED ORDER — OXYCODONE HCL 5 MG PO TABS
5.0000 mg | ORAL_TABLET | Freq: Three times a day (TID) | ORAL | Status: DC | PRN
  Administered 2023-01-30 – 2023-02-01 (×3): 5 mg via ORAL
  Filled 2023-01-29 (×3): qty 1

## 2023-01-29 MED ORDER — ACETAMINOPHEN 650 MG RE SUPP: 650.0000 mg | Freq: Four times a day (QID) | RECTAL | Status: DC | PRN

## 2023-01-29 MED ORDER — SODIUM CHLORIDE 0.9 % IV SOLN
500.0000 mg | INTRAVENOUS | Status: DC
  Administered 2023-01-30 (×2): 500 mg via INTRAVENOUS
  Filled 2023-01-29 (×2): qty 5

## 2023-01-29 MED ORDER — SODIUM CHLORIDE 0.9 % IV SOLN
500.0000 mg | Freq: Once | INTRAVENOUS | Status: AC
  Administered 2023-01-29: 500 mg via INTRAVENOUS
  Filled 2023-01-29: qty 5

## 2023-01-29 MED ORDER — HEPARIN SODIUM (PORCINE) 5000 UNIT/ML IJ SOLN
5000.0000 [IU] | Freq: Three times a day (TID) | INTRAMUSCULAR | Status: DC
  Administered 2023-01-29 – 2023-02-05 (×20): 5000 [IU] via SUBCUTANEOUS
  Filled 2023-01-29 (×20): qty 1

## 2023-01-29 MED ORDER — LOPERAMIDE HCL 2 MG PO CAPS
2.0000 mg | ORAL_CAPSULE | ORAL | Status: DC | PRN
  Administered 2023-01-29 – 2023-02-03 (×2): 2 mg via ORAL
  Filled 2023-01-29 (×2): qty 1

## 2023-01-29 MED ORDER — OXYCODONE HCL 5 MG PO TABS: 5.0000 mg | ORAL_TABLET | ORAL | Status: DC | PRN

## 2023-01-29 MED ORDER — SODIUM CHLORIDE 0.9 % IV SOLN
1.0000 g | Freq: Once | INTRAVENOUS | Status: AC
  Administered 2023-01-29: 1 g via INTRAVENOUS
  Filled 2023-01-29: qty 10

## 2023-01-29 MED ORDER — ACETAMINOPHEN 325 MG PO TABS
650.0000 mg | ORAL_TABLET | Freq: Four times a day (QID) | ORAL | Status: DC | PRN
  Administered 2023-01-29: 650 mg via ORAL
  Filled 2023-01-29: qty 2

## 2023-01-29 MED ORDER — ONDANSETRON HCL 4 MG/2ML IJ SOLN
4.0000 mg | Freq: Four times a day (QID) | INTRAMUSCULAR | Status: DC | PRN
  Administered 2023-01-29 – 2023-02-04 (×5): 4 mg via INTRAVENOUS
  Filled 2023-01-29 (×5): qty 2

## 2023-01-29 NOTE — Plan of Care (Signed)
  Problem: Acute Rehab PT Goals(only PT should resolve) Goal: Pt Will Go Supine/Side To Sit Outcome: Progressing Flowsheets (Taken 01/29/2023 1448) Pt will go Supine/Side to Sit:  Independently  with modified independence Goal: Patient Will Transfer Sit To/From Stand Outcome: Progressing Flowsheets (Taken 01/29/2023 1448) Patient will transfer sit to/from stand: with modified independence Goal: Pt Will Transfer Bed To Chair/Chair To Bed Outcome: Progressing Flowsheets (Taken 01/29/2023 1448) Pt will Transfer Bed to Chair/Chair to Bed: with modified independence Goal: Pt Will Ambulate Outcome: Progressing Flowsheets (Taken 01/29/2023 1448) Pt will Ambulate:  100 feet  with modified independence  with supervision  with rolling walker   2:49 PM, 01/29/23 Ocie Bob, MPT Physical Therapist with [facility] [phone] office [phone] mobile phone

## 2023-01-29 NOTE — Assessment & Plan Note (Signed)
-   Likely related to polypharmacy - Holding back on sedating medications - Pneumonia could be contributing as well

## 2023-01-29 NOTE — Assessment & Plan Note (Signed)
Continue Pepcid  

## 2023-01-29 NOTE — Consult Note (Signed)
[facility] Renal Consultation Note    Indication for Consultation:  Management of ESRD/hemodialysis; anemia, hypertension/volume and secondary hyperparathyroidism  HPI: Felicia Williams is a 82 y.o. 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  She completed her full HD session and was noted to c/o ear pain, back pain, and her Bp did drop at the end of the treatment per the charge nurse at her HD unit.  In the ED, Temp 101.3, Bp 158/58, HR 76, SpO2 98%.  Labs notable for K 3.2, Alb 2.4, WBC 11, Hgb 8.2. CXR diffuse interstitial prominence with lower lobe airspace opacities and layering effusions.  She was admitted for pneumonia and we were consulted to provide dialysis during her admission.  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 Karilyn Cota.   GERD (gastroesophageal reflux disease)    Glomerulonephritis    Gout    Mixed hyperlipidemia    Spondylosis    Past Surgical History:  Procedure Laterality Date   APPENDECTOMY     AV FISTULA PLACEMENT Left 03/20/2022   Procedure: INSERTION OF LEFT UPPER ARM ARTERIOVENOUS (AV) GORE-TEX GRAFT;  Surgeon: Larina Earthly, MD;  Location: [facility];  Service: Vascular;  Laterality: Left;   CATARACT EXTRACTION     COLONOSCOPY N/A 12/28/2015   Procedure: COLONOSCOPY;  Surgeon: Malissa Hippo, MD;  Location: [facility];  Service: Endoscopy;  Laterality: N/A;  815   ESOPHAGOGASTRODUODENOSCOPY N/A 11/16/2020   Procedure: ESOPHAGOGASTRODUODENOSCOPY  (EGD);  Surgeon: Malissa Hippo, MD;  Location: [facility];  Service: Endoscopy;  Laterality: N/A;  1:15   EXCHANGE OF A DIALYSIS CATHETER N/A 03/20/2022   Procedure: EXCHANGE OF A DIALYSIS CATHETER;  Surgeon: Larina Earthly, MD;  Location: [facility];  Service: Vascular;  Laterality: N/A;   INSERTION OF DIALYSIS CATHETER Right 09/30/2021   Procedure: INSERTION OF TUNNELED DIALYSIS CATHETER;  Surgeon: Maeola Harman, MD;  Location: [facility] OR;  Service: Vascular;  Laterality: Right;   IR FLUORO GUIDE CV LINE RIGHT  01/16/2019   IR REMOVAL TUN CV CATH W/O FL  07/01/2019   IR US GUIDE VASC ACCESS RIGHT  01/16/2019   POLYPECTOMY  11/16/2020   Procedure: POLYPECTOMY;  Surgeon: Malissa Hippo, MD;  Location: [facility];  Service: Endoscopy;;  gastric   REMOVAL OF A DIALYSIS CATHETER  09/30/2021   Procedure: REMOVAL OF A PERITONEAL DIALYSIS CATHETER;  Surgeon: Maeola Harman, MD;  Location: [facility] OR;  Service: Vascular;;   REMOVAL OF A DIALYSIS CATHETER N/A 05/22/2022   Procedure: MINOR REMOVAL OF A TUNNELED DIALYSIS CATHETER;  Surgeon: Larina Earthly, MD;  Location: [facility];  Service: Vascular;  Laterality: N/A;   TUBAL LIGATION     Family History:   Family History  Problem Relation Age of Onset   CAD Father    Heart attack Father    Diabetes Mellitus II Father    Hypertension Father    Lupus Brother    Social History:  reports that she has never smoked. She has never been exposed to tobacco smoke. She has never used smokeless tobacco. She reports that she does not drink alcohol and does not use drugs. Allergies  Allergen Reactions   Cefepime Other (See Comments)    Feb 2023 Encephalopathy with questionable seizures. Seems to be tolerating cefazolin    Morphine Other (See Comments)    "Dry heaving like crazy"   Sulfa Antibiotics Other (See Comments)    Shut pt's kidneys down   Prior to Admission medications   Medication Sig Start Date End Date Taking? Authorizing Provider   acetaminophen (TYLENOL) 325 MG tablet Take 2 tablets (650 mg total) by mouth every 6 (six) hours as needed for mild pain. 09/12/21   Love, Evlyn Kanner, PA-C  camphor-menthol [facility]) lotion Apply topically as needed for itching. 09/12/21   Love, Evlyn Kanner, PA-C  carvedilol (COREG) 12.5 MG tablet Take 1 tablet by mouth 2 (two) times daily.    [provider]  cephALEXin (KEFLEX) 500 MG capsule Take 500 mg by mouth every 12 (twelve) hours. 12/20/22   [provider]  famotidine (PEPCID) 40 MG tablet Take 1 tablet (40 mg total) by mouth at bedtime. Patient taking differently: Take 40 mg by mouth as needed for heartburn or indigestion. 11/28/21   Malissa Hippo, MD  hydrALAZINE (APRESOLINE) 50 MG tablet Take 50 mg by mouth 2 (two) times daily. Do not take morning dose on Dialysis days. Monday,Wednesday and Friday    [provider]  loperamide (IMODIUM) 2 MG capsule Take 1 capsule (2 mg total) by mouth as needed for diarrhea or loose stools. 09/12/21   Love, Evlyn Kanner, PA-C  midodrine (PROAMATINE) 10 MG tablet Take 1 tablet (10 mg total) by mouth every Monday, Wednesday, and Friday. 10/26/22   Sherryll Burger, Pratik D, DO  multivitamin (RENA-VIT) TABS tablet Take 1 tablet by mouth daily. One daily    [provider]  nitroGLYCERIN (NITROSTAT) 0.4 MG SL tablet Place 0.4 mg under the tongue every 5 (five) minutes as needed for chest pain. 12/28/22   [provider]  oxyCODONE (OXY IR/ROXICODONE) 5 MG immediate release tablet Take 5 mg by mouth 3 (three) times daily as needed. 01/08/23   [provider]  traZODone (DESYREL) 100 MG tablet Take 100 mg by mouth at bedtime. 07/07/22   [provider]   Current Facility-Administered Medications  Medication Dose Route Frequency Provider Last Rate Last Admin   acetaminophen (TYLENOL) tablet 650 mg  650 mg Oral Q6H PRN Zierle-Ghosh, Asia B, DO       Or   acetaminophen (TYLENOL) suppository 650 mg  650 mg Rectal Q6H PRN  Zierle-Ghosh, Asia B, DO       [START ON 01/30/2023] azithromycin (ZITHROMAX) 500 mg in sodium chloride 0.9 % 250 mL IVPB  500 mg Intravenous Q24H Zierle-Ghosh, Asia B, DO       carvedilol (COREG) tablet 12.5 mg  12.5 mg Oral BID Zierle-Ghosh, Asia B, DO   12.5 mg at 01/29/23 1017   [START ON 01/30/2023] cefTRIAXone (ROCEPHIN) 2 g in sodium chloride 0.9 % 100 mL IVPB  2 g Intravenous Q24H Zierle-Ghosh, Asia B, DO       heparin injection 5,000 Units  5,000 Units Subcutaneous Q8H Zierle-Ghosh, Asia B, DO   5,000 Units at 01/29/23 0527   hydrALAZINE (APRESOLINE) tablet 50 mg  50 mg Oral BID Zierle-Ghosh, Asia B, DO   50 mg at 01/29/23 1017   ondansetron (ZOFRAN)  tablet 4 mg  4 mg Oral Q6H PRN Zierle-Ghosh, Asia B, DO       Or   ondansetron (ZOFRAN) injection 4 mg  4 mg Intravenous Q6H PRN Zierle-Ghosh, Asia B, DO   4 mg at 01/29/23 1024   Labs: Basic Metabolic Panel: Recent Labs  Lab 01/29/23 0032 01/29/23 0444  NA 136 136  K 3.2* 3.0*  CL 96* 98  CO2 28 26  GLUCOSE 90 83  BUN 28* 31*  CREATININE 4.13* 4.46*  CALCIUM 7.6* 7.4*   Liver Function Tests: Recent Labs  Lab 01/29/23 0032 01/29/23 0444  AST 28 24  ALT 18 16  ALKPHOS 92 75  BILITOT 0.5 0.7  PROT 6.6 5.6*  ALBUMIN 2.4* 2.0*   No results for input(s): "LIPASE", "AMYLASE" in the last 168 hours. No results for input(s): "AMMONIA" in the last 168 hours. CBC: Recent Labs  Lab 01/29/23 0032 01/29/23 0444  WBC 11.0* 8.9  NEUTROABS 9.4* 7.3  HGB 8.2* 7.1*  HCT 25.9* 22.4*  MCV 93.2 92.6  PLT 305 279   Cardiac Enzymes: No results for input(s): "CKTOTAL", "CKMB", "CKMBINDEX", "TROPONINI" in the last 168 hours. CBG: No results for input(s): "GLUCAP" in the last 168 hours. Iron Studies: No results for input(s): "IRON", "TIBC", "TRANSFERRIN", "FERRITIN" in the last 72 hours. Studies/Results: DG Chest Portable 1 View  Result Date: 01/29/2023 CLINICAL DATA:  Fever, questionable sepsis EXAM: PORTABLE CHEST 1 VIEW  COMPARISON:  01/06/2023 FINDINGS: Heart is borderline in size. Aortic atherosclerosis. Diffuse interstitial prominence with bilateral lower lobe airspace opacities and layering effusions. Favor edema although infection is not excluded. Airspace opacities and effusions have worsened since prior study. IMPRESSION: Diffuse interstitial prominence with lower lobe airspace opacities and layering effusions. Favor edema although infection is not excluded. Electronically Signed   By: Charlett Nose M.D.   On: 01/29/2023 00:02    ROS: Pertinent items are noted in HPI. Physical Exam: Vitals:   01/29/23 0251 01/29/23 0337 01/29/23 0500 01/29/23 0756  BP:  (!) 157/58  (!) 176/57  Pulse:  71  68  Resp:  18  16  Temp: 97.8 F (36.6 C) 98 F (36.7 C)  97.8 F (36.6 C)  TempSrc: Oral Oral  Oral  SpO2:  100%  100%  Weight:   44.5 kg   Height:          Weight change:   Intake/Output Summary (Last 24 hours) at 01/29/2023 1050 Last data filed at 01/29/2023 0900 Gross per 24 hour  Intake 220 ml  Output --  Net 220 ml   BP (!) 176/57 (BP Location: Right Arm)   Pulse 68   Temp 97.8 F (36.6 C) (Oral)   Resp 16   Ht 4\' 11"  (1.499 m)   Wt 44.5 kg   SpO2 100%   BMI 19.81 kg/m  General appearance: no distress and slowed mentation Head: Normocephalic, without obvious abnormality, atraumatic Resp: clear to auscultation bilaterally Cardio: regular rate and rhythm, S1, S2 normal, no murmur, click, rub or gallop GI: soft, non-tender; bowel sounds normal; no masses,  no organomegaly Extremities: extremities normal, atraumatic, no cyanosis or edema and LUE AVG +T/B Dialysis Access:  Dialysis Orders: Center: [name]  on MWF . EDW 43.5kg HD Bath 1K/2.5Ca  Time 3:15 Heparin 1000 unit bolus then 1200 units/hr. Access LAVG BFR 400 DFR 500  Profile 2      Assessment/Plan:  CAP - started on Rocephin and Zithromax per primary svc  ESRD -  Continue with HD on MWF schedule  Hypertension/volume  -  stable and  UF as tolerated with HD tomorrow  Anemia  - Hgb dropping to 7.1.  would benefit from blood transfusion.  Unclear source.  She denies any hematochezia, melena, or BRBPR.  Continue to follow and will dose with ESA.  Metabolic bone disease -  continue with home meds  Nutrition - renal diet  Acute metabolic encephalopathy - somewhat confused and likely due to pneumonia and pain meds.  Continue to follow.  Prolonged QT interval - per primary.  Currently on telemetry  Hypokalemia - will use added K bath with HD tomorrow if still low.  Will give po KCl 40 mEq x 1 and follow.  Irena Cords, MD [facility], [facility] 01/29/2023, 10:50 AM

## 2023-01-29 NOTE — Assessment & Plan Note (Signed)
-  Continue Coreg and hydralazine 

## 2023-01-29 NOTE — Progress Notes (Signed)
No charge note  Patient seen and examined this morning, admitted overnight, H&P reviewed and agree with the assessment and plan.  Pleasant 82 year old female with history of ESRD, chronic anemia of renal disease, CHF, depression, HTN, HLD comes into the hospital with weakness.  Daughter is at bedside this morning and tells me that she has been weak for the past several days, and patient has refused to come to the ER for evaluation.  When she was unable to walk they decided to bring her in.  She was found to have a fever in the ED, was complaining of a cough and a chest x-ray shows potential pneumonia.  She was started on antibiotics, continue.  Obtain physical therapy consult to assess level of functioning.  Nephrology was consulted for her dialysis needs, currently she is not appearing to be fluid overloaded and appears euvolemic.  Most recent fever of 101.3 last evening at 10 PM.  Continue to monitor, could potentially go home tomorrow if she remains afebrile tonight, strength improves and she tolerates dialysis tomorrow  Felicia Williams M. Elvera Lennox, MD, PhD [facility]  Between 7 am - 7 pm you can contact me via Amion (for emergencies) or Securechat (non urgent matters).  I am not available 7 pm - 7 am, please contact night coverage MD/APP via Amion

## 2023-01-29 NOTE — Telephone Encounter (Signed)
Attempted to contact daughter. Left message on machine.

## 2023-01-29 NOTE — Assessment & Plan Note (Signed)
-   Daughter describes as patient being "out of it." - Patient is recently increased her oxycodone from 5 mg, to 10 mg 3 times daily - Polypharmacy is likely the cause of her altered mental status - Pneumonia could be contributing - Will discontinue trazodone and oxycodone for now, and see if she starts to wake up a bit - During my exam patient had also just received Ativan in the ER and is not able to participate in the exam

## 2023-01-29 NOTE — Assessment & Plan Note (Signed)
-   Potassium 3.2 - Deferring to nephrology

## 2023-01-29 NOTE — Telephone Encounter (Signed)
Attempted to contact pt's daughter- left a message to return call.

## 2023-01-29 NOTE — H&P (Addendum)
History and Physical    Patient: Felicia Williams ZOX:[id number] DOB: 02-21-1941 DOA: 01/28/2023 DOS: the patient was seen and examined on 01/29/2023 PCP: Rebekah Chesterfield, NP  Patient coming from: Home  Chief Complaint: Generalized weakness HPI: Felicia Williams is a 82 y.o. female with medical history significant of ESRD, anemia, CHF, depression, hypertension, gastroparesis, GERD, mixed hyperlipidemia, and more presents the ED with a chief complaint of generalized weakness.  Patient is not able to participate in exam with me as she cannot wake up long enough to do it.  She was just given Ativan in the ED.  Daughter is at bedside.  She is a poor historian.  She reports that patient has been out of it, had dyspnea, and head pain.  Patient called a provider and asked if she could double her oxycodone and was told to go ahead and do that per the daughter's report.  Per the daughter, it still not controlling her pain and she moans all day long.  Daughter reports she has had shortness of breath for couple of months.  It got worse about a week ago.  She was put on oxygen recently.  She reports that patient is coughing a lot after meals..  They report that to the knowledge she has not had any fevers at home.  In the ER when she got here she did have a fever of 101.3.  Daughter cannot remember any more complaints that patient has had.  She does not smoke, she does not drink.  They have not discussed a living well or advanced directives other than the son is the power of attorney.  She will be full code by default at this time Review of Systems: unable to review all systems due to the inability of the patient to answer questions. Past Medical History:  Diagnosis Date   Anemia in ESRD (end-stage renal disease) (HCC) 08/18/2018   CHF (congestive heart failure) (HCC)    Coccyx contusion--with chronic pain due to fall    Depression    Dyspnea    ESRD on hemodialysis (HCC)    Essential hypertension, benign     Gastric polyps    Gastroparesis    followed by Dr. Karilyn Cota.   GERD (gastroesophageal reflux disease)    Glomerulonephritis    Gout    Mixed hyperlipidemia    Spondylosis    Past Surgical History:  Procedure Laterality Date   APPENDECTOMY     AV FISTULA PLACEMENT Left 03/20/2022   Procedure: INSERTION OF LEFT UPPER ARM ARTERIOVENOUS (AV) GORE-TEX GRAFT;  Surgeon: Larina Earthly, MD;  Location: [facility];  Service: Vascular;  Laterality: Left;   CATARACT EXTRACTION     COLONOSCOPY N/A 12/28/2015   Procedure: COLONOSCOPY;  Surgeon: Malissa Hippo, MD;  [facility];  Service: Endoscopy;  Laterality: N/A;  815   ESOPHAGOGASTRODUODENOSCOPY N/A 11/16/2020   Procedure: ESOPHAGOGASTRODUODENOSCOPY (EGD);  Surgeon: Malissa Hippo, MD;  Location: [facility];  Service: Endoscopy;  Laterality: N/A;  1:15   EXCHANGE OF A DIALYSIS CATHETER N/A 03/20/2022   Procedure: EXCHANGE OF A DIALYSIS CATHETER;  Surgeon: Larina Earthly, MD;  Location: [facility];  Service: Vascular;  Laterality: N/A;   INSERTION OF DIALYSIS CATHETER Right 09/30/2021   Procedure: INSERTION OF TUNNELED DIALYSIS CATHETER;  Surgeon: Maeola Harman, MD;  Location: [facility] OR;  Service: Vascular;  Laterality: Right;   IR FLUORO GUIDE CV LINE RIGHT  01/16/2019   IR REMOVAL TUN CV CATH W/O FL  07/01/2019   IR US GUIDE VASC ACCESS RIGHT  01/16/2019   POLYPECTOMY  11/16/2020   Procedure: POLYPECTOMY;  Surgeon: Malissa Hippo, MD;  Location[facility];  Service: Endoscopy;;  gastric   REMOVAL OF A DIALYSIS CATHETER  09/30/2021   Procedure: REMOVAL OF A PERITONEAL DIALYSIS CATHETER;  Surgeon: Maeola Harman, MD;  Location: [facility] OR;  Service: Vascular;;   REMOVAL OF A DIALYSIS CATHETER N/A 05/22/2022   Procedure: MINOR REMOVAL OF A TUNNELED DIALYSIS CATHETER;  Surgeon: Larina Earthly, MD;  Location: [facility];  Service: Vascular;  Laterality: N/A;   TUBAL LIGATION     Social History:  reports that she has never smoked. She  has never been exposed to tobacco smoke. She has never used smokeless tobacco. She reports that she does not drink alcohol and does not use drugs.  Allergies  Allergen Reactions   Cefepime Other (See Comments)    Feb 2023 Encephalopathy with questionable seizures. Seems to be tolerating cefazolin    Morphine Other (See Comments)    "Dry heaving like crazy"   Sulfa Antibiotics Other (See Comments)    Shut pt's kidneys down    Family History  Problem Relation Age of Onset   CAD Father    Heart attack Father    Diabetes Mellitus II Father    Hypertension Father    Lupus Brother     Prior to Admission medications   Medication Sig Start Date End Date Taking? Authorizing Provider  acetaminophen (TYLENOL) 325 MG tablet Take 2 tablets (650 mg total) by mouth every 6 (six) hours as needed for mild pain. 09/12/21   Love, Evlyn Kanner, PA-C  camphor-menthol [facility]) lotion Apply topically as needed for itching. 09/12/21   Love, Evlyn Kanner, PA-C  carvedilol (COREG) 12.5 MG tablet Take 1 tablet by mouth 2 (two) times daily.    [provider]  cephALEXin (KEFLEX) 500 MG capsule Take 500 mg by mouth every 12 (twelve) hours. 12/20/22   [provider]  famotidine (PEPCID) 40 MG tablet Take 1 tablet (40 mg total) by mouth at bedtime. Patient taking differently: Take 40 mg by mouth as needed for heartburn or indigestion. 11/28/21   Malissa Hippo, MD  hydrALAZINE (APRESOLINE) 50 MG tablet Take 50 mg by mouth 2 (two) times daily. Do not take morning dose on Dialysis days. Monday,Wednesday and Friday    [provider]  loperamide (IMODIUM) 2 MG capsule Take 1 capsule (2 mg total) by mouth as needed for diarrhea or loose stools. 09/12/21   Love, Evlyn Kanner, PA-C  midodrine (PROAMATINE) 10 MG tablet Take 1 tablet (10 mg total) by mouth every Monday, Wednesday, and Friday. 10/26/22   Sherryll Burger, Pratik D, DO  multivitamin (RENA-VIT) TABS tablet Take 1 tablet by mouth daily. One daily    [provider]  nitroGLYCERIN (NITROSTAT) 0.4 MG SL tablet Place 0.4 mg under the tongue every 5 (five) minutes as needed for chest pain. 12/28/22   [provider]  oxyCODONE (OXY IR/ROXICODONE) 5 MG immediate release tablet Take 5 mg by mouth 3 (three) times daily as needed. 01/08/23   [provider]  traZODone (DESYREL) 100 MG tablet Take 100 mg by mouth at bedtime. 07/07/22   [provider]    Physical Exam: Vitals:   01/28/23 2257 01/28/23 2300 01/29/23 0251 01/29/23 0337  BP: (!) 158/58   (!) 157/58  Pulse: 76   71  Resp: 20   18  Temp: Marland Kitchen)  101.3 F (38.5 C)  97.8 F (36.6 C) 98 F (36.7 C)  TempSrc: Oral  Oral Oral  SpO2: 98%   100%  Weight:  43.1 kg    Height:  4\' 11"  (1.499 m)     1.  General: Patient lying supine in bed,  no acute distress   2. Psychiatric: Somnolent, not answering questions   3. Neurologic: Was not able to assess speech, face is symmetric, moves all 4 extremities voluntarily,   4. HEENMT:  Head is atraumatic, normocephalic, pupils reactive to light, neck is supple, trachea is midline, mucous membranes are moist   5. Respiratory : Lungs are clear to auscultation bilaterally without wheezing, rhonchi, rales, no cyanosis, no increase in work of breathing or accessory muscle use   6. Cardiovascular : Heart rate normal, rhythm is regular, no rubs or gallops, no peripheral edema, peripheral pulses palpated   7. Gastrointestinal:  Abdomen is soft, nondistended, nontender to palpation bowel sounds active, no masses or organomegaly palpated   8. Skin:  Skin is warm, dry and intact without rashes, acute lesions, or ulcers on limited exam   9.Musculoskeletal:  No acute deformities or trauma, no asymmetry in tone, no peripheral edema, peripheral pulses palpated, no tenderness to palpation in the extremities  Data Reviewed: In the ED Temp 101.3, heart rate 76, respiratory rate 20, blood pressure 158/58 Borderline leukocytosis  at 11, hemoglobin 8.2 Chemistry reveals a hypokalemia at 3.2, elevated BUN at 28, elevated creatinine at 4.13 in this ESRD patient Blood culture pending Chest x-ray shows diffuse interstitial prominence that could be edema versus infection EKG shows a heart rate of 75, sinus rhythm, QTc 553 She was started on Rocephin and Zithromax She was given Ativan Admission requested for CAP and generalized weakness   Assessment and Plan: * CAP (community acquired pneumonia) - Chest x-ray shows prominence that could be edema versus infection, but given her generalized weakness and her fever of 101.3 leaning towards infection - Started on Rocephin and Zithromax - Continue Rocephin and Zithromax - Expectorated sputum culture - Patient does not make urine, so we will not be collecting the urine antigens - Continue to monitor  ESRD on dialysis (HCC) - Consult nephro-nephro has not been notified of this patient yet - Defer electrolyte management to nephro - Continue to monitor  Essential hypertension, benign - Continue Coreg and hydralazine  Prolonged QT interval - Possibly related to hypokalemia - Defer hypokalemia management to nephro - Monitor on telemetry - Avoid QT prolonging agents when possible  GERD (gastroesophageal reflux disease) - Continue Pepcid  Generalized weakness - Likely related to polypharmacy - Holding back on sedating medications - Pneumonia could be contributing as well  Hypokalemia - Potassium 3.2 - Deferring to nephrology  Acute metabolic encephalopathy - Daughter describes as patient being "out of it." - Patient is recently increased her oxycodone from 5 mg, to 10 mg 3 times daily - Polypharmacy is likely the cause of her altered mental status - Pneumonia could be contributing - Will discontinue trazodone and oxycodone for now, and see if she starts to wake up a bit - During my exam patient had also just received Ativan in the ER and is not able to  participate in the exam      Advance Care Planning:   Code Status: Full Code  Consults: Nephro  Family Communication: No family at bedside  Severity of Illness: The appropriate patient status for this patient is OBSERVATION. Observation status is judged to be  reasonable and necessary in order to provide the required intensity of service to ensure the patient's safety. The patient's presenting symptoms, physical exam findings, and initial radiographic and laboratory data in the context of their medical condition is felt to place them at decreased risk for further clinical deterioration. Furthermore, it is anticipated that the patient will be medically stable for discharge from the hospital within 2 midnights of admission.   Author: Lilyan Gilford, DO 01/29/2023 5:05 AM  For on call review [email or web address].

## 2023-01-29 NOTE — Progress Notes (Signed)
Patient has slept since she came to the floor at 0345 am. Patient daughter is at bedside. No complaints of pain or discomfort at this time. Plan of care ongoing.

## 2023-01-29 NOTE — Assessment & Plan Note (Signed)
-   Possibly related to hypokalemia - Defer hypokalemia management to nephro - Monitor on telemetry - Avoid QT prolonging agents when possible

## 2023-01-29 NOTE — Progress Notes (Signed)
   01/29/23 1657  Spiritual Encounters  Type of Visit Initial  Care provided to: Patient  Conversation partners present during encounter Nurse  Referral source Family  Reason for visit Advance directives  OnCall Visit No  Spiritual Framework  Presenting Themes Meaning/purpose/sources of inspiration;Goals in life/care  Community/Connection Family  Patient Stress Factors Health changes  Family Stress Factors None identified  Interventions  Spiritual Care Interventions Made Compassionate presence;Established relationship of care and support;Reflective listening;Prayer;Encouragement  Intervention Outcomes  Outcomes Autonomy/agency;Awareness of support;Connection to spiritual care  Spiritual Care Plan  Spiritual Care Issues Still Outstanding Felicia Williams will continue to follow   Referred to patient for Advanced Care Planning. Found [name] in hospital bed with eyes closed. She opened eyes and welcomed Chaplain warmly bedside. She shared that she already has filled out Power of [employer] and it includes a Chiropractor. She has elected her daughter to be her Health Care Proxy.   Chaplain engaged her in reflection around her upbringing, spiritual heritage, and illness story today and gave her space to reflect. She did so openly and asked for prayer at end of visit. Her hope is that she can return home and continue living with the same quality of life she has experienced so far. Will remain available in order to provide spiritual support and to assess for spiritual need.   Rev. Jolyn Lent, M.Div Chaplain

## 2023-01-29 NOTE — TOC Initial Note (Signed)
Transition of Care [facility]) - Initial/Assessment Note    Patient Details  Name: Felicia Williams MRN: [id number] Date of Birth: 23-Dec-1940  Transition of [facility]) CM/SW Contact:    Villa Herb, LCSWA Phone Number: 01/29/2023, 1:08 PM  Clinical Narrative:                 CSW updated that PT is recommending HH PT for pt at D/C. CSW spoke with pt who states that she did not feel this was very useful in the past. CSW inquired about interest in outpatient PT, pt is not interested in this. Pt declines both HH and OP PT at this time. Pt has a cane, walker and wheelchair to use when needed. Pt wears 3L O2 at baseline. TOC to follow.   Expected Discharge Plan: Home/Self Care Barriers to Discharge: Continued Medical Work up   Patient Goals and CMS Choice Patient states their goals for this hospitalization and ongoing recovery are:: return home CMS Medicare.gov Compare Post Acute Care list provided to:: Patient Choice offered to / list presented to : Patient      Expected Discharge Plan and Services In-house Referral: Clinical Social Work Discharge Planning Services: CM Consult   Living arrangements for the past 2 months: Single Family Home                                      Prior Living Arrangements/Services Living arrangements for the past 2 months: Single Family Home Lives with:: Self Patient language and need for interpreter reviewed:: Yes Do you feel safe going back to the place where you live?: Yes      Need for Family Participation in Patient Care: Yes (Comment) Care giver support system in place?: Yes (comment) Current home services: DME Criminal Activity/Legal Involvement Pertinent to Current Situation/Hospitalization: No - Comment as needed  Activities of Daily Living Home Assistive Devices/Equipment: Oxygen ADL Screening (condition at time of admission) Patient's cognitive ability adequate to safely complete daily activities?: Yes Is the patient deaf or  have difficulty hearing?: No Does the patient have difficulty seeing, even when wearing glasses/contacts?: No Does the patient have difficulty concentrating, remembering, or making decisions?: No Patient able to express need for assistance with ADLs?: Yes Does the patient have difficulty dressing or bathing?: No Independently performs ADLs?: Yes (appropriate for developmental age) Does the patient have difficulty walking or climbing stairs?: No Weakness of Legs: Both Weakness of Arms/Hands: None  Permission Sought/Granted                  Emotional Assessment Appearance:: Appears stated age Attitude/Demeanor/Rapport: Engaged Affect (typically observed): Accepting Orientation: : Oriented to Self, Oriented to Place, Oriented to  Time, Oriented to Situation Alcohol / Substance Use: Not Applicable Psych Involvement: No (comment)  Admission diagnosis:  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] CAP (community acquired pneumonia) [J18.9] Acute febrile illness [R50.9] Patient Active Problem List   Diagnosis Date Noted   CAP (community acquired pneumonia) 01/29/2023   Prolonged QT interval 01/29/2023   (HFpEF) heart failure with preserved ejection fraction (HCC) 01/08/2023   Acute hypoxic respiratory failure (HCC) 10/25/2022   Acute hypoxemic respiratory failure (HCC) 10/14/2022   Nausea and vomiting 10/14/2022   Abnormal stress test 08/16/2022   Chronic diarrhea 08/09/2022   GERD (gastroesophageal reflux disease) 07/12/2022   Hypertensive crisis 07/12/2022   Acute respiratory failure with hypoxia (HCC) 04/25/2022   Acute  pulmonary edema (HCC)    Pressure injury of skin 04/23/2022   Volume overload 04/22/2022   Elevated troponin 04/22/2022   Hyperkalemia 02/28/2022   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   Anorexia 11/28/2021   Weight loss 11/28/2021   Toxic encephalopathy 09/11/2021   Malnutrition of moderate degree 09/10/2021   Generalized weakness 09/07/2021   Debility  09/07/2021   Change in bowel movement 09/06/2021   Acute metabolic encephalopathy 08/30/2021   Hypokalemia 08/30/2021   DOE (dyspnea on exertion) 08/29/2021   Pruritus 08/28/2021   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   Age-related osteoporosis without current pathological fracture 08/26/2021   Body mass index (BMI) 22.0-22.9, adult 08/26/2021   Cramp in limb 08/26/2021   Epistaxis 08/26/2021   Iron deficiency anemia 08/26/2021   Localized, 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   Overactive bladder 08/26/2021   Sciatica 08/26/2021   Vitamin D deficiency 08/26/2021   Panic disorder 08/26/2021   Sepsis (HCC) 08/26/2021   Gastroparesis 05/23/2021   Chronic cough 05/23/2021   Renal failure 01/15/2019   ARF (acute renal failure) (HCC) 01/07/2019   Glomerulonephritis 01/07/2019   Psoriasis 01/07/2019   Tachycardia, unspecified 01/07/2019   Macrocytic anemia 08/18/2018   Proteinuria 09/04/2017   Essential hypertension, benign 01/26/2014   Palpitations 01/26/2014   Fatigue due to depression 01/26/2014   Gout 07/30/2012   Mixed hyperlipidemia 10/01/2011   Depression 10/01/2011   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   PCP:  Rebekah Chesterfield, NP Pharmacy:   [facility] [address], [place] - [address] [place] [address] Phone: [phone] Fax: [phone]     Social Determinants of Health (SDOH) Social History: SDOH Screenings   Food Insecurity: No Food Insecurity (01/29/2023)  Housing: Low Risk  (01/29/2023)  Transportation Needs: No Transportation Needs (01/29/2023)  Utilities: Not At Risk (01/29/2023)  Tobacco Use: Low Risk  (01/28/2023)   SDOH Interventions:     Readmission Risk Interventions    10/25/2022    8:06 AM 10/15/2022   12:28 PM  Readmission Risk Prevention Plan  Transportation Screening Complete Complete  PCP or Specialist Appt within 3-5 Days  Complete  HRI or Home Care Consult Complete Complete  Social  Work Consult for Recovery Care Planning/Counseling Complete Complete  Palliative Care Screening Not Applicable Not Applicable  Medication Review Oceanographer) Complete Complete

## 2023-01-29 NOTE — Assessment & Plan Note (Signed)
-   Chest x-ray shows prominence that could be edema versus infection, but given her generalized weakness and her fever of 101.3 leaning towards infection - Started on Rocephin and Zithromax - Continue Rocephin and Zithromax - Expectorated sputum culture - Patient does not make urine, so we will not be collecting the urine antigens - Continue to monitor

## 2023-01-29 NOTE — Assessment & Plan Note (Addendum)
-   Consult nephro-nephro has not been notified of this patient yet - Defer electrolyte management to nephro - Continue to monitor

## 2023-01-29 NOTE — ED Provider Notes (Signed)
[place] [facility] AT [facility] Provider Note   CSN: [id number] Arrival date & time: 01/28/23  2248     History  Chief complaint-back pain  Felicia Williams is a 82 y.o. female.  The history is provided by the patient and a relative.  Patient with extensive history including ESRD, CHF, chronic back pain, gastroparesis presents with increasing back pain and generalized weakness.  Patient reports she had a full dialysis today, and afterward she started having increasing chronic back pain.  No recent falls or trauma.  She reports generalized weakness, but no focal weakness. She was unaware she had a fever.  She has chronic shortness of breath and has been wearing oxygen.  She has also had some cough.  She reports chronic diarrhea, but no abdominal pain.  No active chest pain. Seen by her PCP earlier in the day for ear pain and was placed on Keflex   Past Medical History:  Diagnosis Date   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 Karilyn Cota.   GERD (gastroesophageal reflux disease)    Glomerulonephritis    Gout    Mixed hyperlipidemia    Spondylosis     Home Medications Prior to Admission medications   Medication Sig Start Date End Date Taking? Authorizing Provider  acetaminophen (TYLENOL) 325 MG tablet Take 2 tablets (650 mg total) by mouth every 6 (six) hours as needed for mild pain. 09/12/21   Love, Evlyn Kanner, PA-C  camphor-menthol [facility]) lotion Apply topically as needed for itching. 09/12/21   Love, Evlyn Kanner, PA-C  carvedilol (COREG) 12.5 MG tablet Take 1 tablet by mouth 2 (two) times daily.    [provider]  cephALEXin (KEFLEX) 500 MG capsule Take 500 mg by mouth every 12 (twelve) hours. 12/20/22    [provider]  famotidine (PEPCID) 40 MG tablet Take 1 tablet (40 mg total) by mouth at bedtime. Patient taking differently: Take 40 mg by mouth as needed for heartburn or indigestion. 11/28/21   Malissa Hippo, MD  hydrALAZINE (APRESOLINE) 50 MG tablet Take 50 mg by mouth 2 (two) times daily. Do not take morning dose on Dialysis days. Monday,Wednesday and Friday    [provider]  loperamide (IMODIUM) 2 MG capsule Take 1 capsule (2 mg total) by mouth as needed for diarrhea or loose stools. 09/12/21   Love, Evlyn Kanner, PA-C  midodrine (PROAMATINE) 10 MG tablet Take 1 tablet (10 mg total) by mouth every Monday, Wednesday, and Friday. 10/26/22   Sherryll Burger, Pratik D, DO  multivitamin (RENA-VIT) TABS tablet Take 1 tablet by mouth daily. One daily    [provider]  nitroGLYCERIN (NITROSTAT) 0.4 MG SL tablet Place 0.4 mg under the tongue every 5 (five) minutes as needed for chest pain. 12/28/22   [provider]  oxyCODONE (OXY IR/ROXICODONE) 5 MG immediate release tablet Take 5 mg by mouth 3 (three) times daily as needed. 01/08/23   [provider]  traZODone (DESYREL) 100 MG tablet Take 100 mg by mouth at bedtime. 07/07/22   [provider]      Allergies    Cefepime, Morphine, and Sulfa antibiotics    Review of Systems  Review of Systems  Constitutional:  Positive for fatigue.  Respiratory:  Positive for cough and shortness of breath.   Musculoskeletal:  Positive for back pain.    Physical Exam Updated Vital Signs BP (!) 158/58   Pulse 76   Temp (!) 101.3 F (38.5 C) (Oral)   Resp 20   Ht 1.499 m (4\' 11" )   Wt 43.1 kg   SpO2 98%   BMI 19.19 kg/m  Physical Exam CONSTITUTIONAL: Chronically ill-appearing, no acute distress HEAD: Normocephalic/atraumatic EYES: EOMI/PERRL ENMT: Mucous membranes moist NECK: supple no meningeal signs SPINE/BACK: Kyphotic spine, no bruising, no erythema, no focal tenderness CV: S1/S2 noted LUNGS: Crackles  bilaterally, wearing oxygen ABDOMEN: soft, nontender NEURO: Pt is awake/alert/appropriate, moves all extremitiesx4.  No facial droop.  She can easily move all 4 extremities without difficulty EXTREMITIES: pulses normal/equal, full ROM, no deformities SKIN: warm, color normal  ED Results / Procedures / Treatments   Labs (all labs ordered are listed, but only abnormal results are displayed) Labs Reviewed  COMPREHENSIVE METABOLIC PANEL - Abnormal; Notable for the following components:      Result Value   Potassium 3.2 (*)    Chloride 96 (*)    BUN 28 (*)    Creatinine, Ser 4.13 (*)    Calcium 7.6 (*)    Albumin 2.4 (*)    GFR, Estimated 10 (*)    All other components within normal limits  CBC WITH DIFFERENTIAL/PLATELET - Abnormal; Notable for the following components:   WBC 11.0 (*)    RBC 2.78 (*)    Hemoglobin 8.2 (*)    HCT 25.9 (*)    RDW 16.5 (*)    Neutro Abs 9.4 (*)    Lymphs Abs 0.6 (*)    All other components within normal limits  PROTIME-INR - Abnormal; Notable for the following components:   Prothrombin Time 15.5 (*)    All other components within normal limits  CULTURE, BLOOD (ROUTINE X 2)  CULTURE, BLOOD (ROUTINE X 2)  SARS CORONAVIRUS 2 BY RT PCR  LACTIC ACID, PLASMA    EKG EKG Interpretation Date/Time:  Tuesday January 29 2023 00:47:08 EDT Ventricular Rate:  75 PR Interval:  150 QRS Duration:  94 QT Interval:  495 QTC Calculation: 553 R Axis:   22  Text Interpretation: Sinus rhythm Nonspecific repol abnormality, diffuse leads Prolonged QT interval Confirmed by Zadie Rhine ([id number]) on 01/29/2023 1:53:44 AM  Radiology DG Chest Portable 1 View  Result Date: 01/29/2023 CLINICAL DATA:  Fever, questionable sepsis EXAM: PORTABLE CHEST 1 VIEW COMPARISON:  01/06/2023 FINDINGS: Heart is borderline in size. Aortic atherosclerosis. Diffuse interstitial prominence with bilateral lower lobe airspace opacities and layering effusions. Favor edema although infection is  not excluded. Airspace opacities and effusions have worsened since prior study. IMPRESSION: Diffuse interstitial prominence with lower lobe airspace opacities and layering effusions. Favor edema although infection is not excluded. Electronically Signed   By: Charlett Nose M.D.   On: 01/29/2023 00:02    Procedures Procedures    Medications Ordered in ED Medications  azithromycin (ZITHROMAX) 500 mg in sodium chloride 0.9 % 250 mL IVPB (has no administration in time range)  LORazepam (ATIVAN) tablet 1 mg (1 mg Oral Given 01/29/23 0044)  acetaminophen (TYLENOL) tablet 650 mg (650 mg Oral Given 01/29/23 0044)  cefTRIAXone (ROCEPHIN) 1 g in sodium chloride 0.9 % 100 mL IVPB (1 g Intravenous New Bag/Given 01/29/23 0154)    ED Course/ Medical Decision Making/ A&P Clinical Course as of 01/29/23  0229  Tue Jan 29, 2023  0109 WBC(!): 11.0 Leukocytosis [DW]  0226 Patient presented with worsening upper chronic back pain and generalized weakness.  Patient was noted to be febrile which she was unaware of.  Patient is a dialysis patient which makes her higher risk.  She has an abnormal x-ray, patient given IV antibiotics.  Blood cultures have been sent.  Lactic acid is normal.  Overall patient is not septic appearing.  She has no new focal weakness.  Back pain is chronic. Plan for admission due to fever and a dialysis patient. Discussed with Dr. Dorthula Perfect for admission [DW]    Clinical Course User Index [DW] Zadie Rhine, MD                             Medical Decision Making Amount and/or Complexity of Data Reviewed Labs: ordered. Decision-making details documented in ED Course. Radiology: ordered. ECG/medicine tests: ordered.  Risk OTC drugs. Prescription drug management. Decision regarding hospitalization.   This patient presents to the ED for concern of weakness, this involves an extensive number of treatment options, and is a complaint that carries with it a high risk of complications and  morbidity.  The differential diagnosis includes but is not limited to CVA, intracranial hemorrhage, acute coronary syndrome, renal failure, urinary tract infection, electrolyte disturbance, pneumonia    Comorbidities that complicate the patient evaluation: Patient's presentation is complicated by their history of end  stage renal disease and chronic pain  Social Determinants of Health: Patient's  chronic pain   increases the complexity of managing their presentation  Additional history obtained: Additional history obtained from family Records reviewed previous admission documents  Lab Tests: I Ordered, and personally interpreted labs.  The pertinent results include:  leukocytosis  Imaging Studies ordered: I ordered imaging studies including X-ray chest   I independently visualized and interpreted imaging which showed edema versus infection I agree with the radiologist interpretation   Medicines ordered and prescription drug management: I ordered medication including Ativan for anxiety Reevaluation of the patient after these medicines showed that the patient    improved    Critical Interventions:   IV antibiotics  Consultations Obtained: I requested consultation with the admitting physician [facility] , and discussed  findings as well as pertinent plan - they recommend: admit  Reevaluation: After the interventions noted above, I reevaluated the patient and found that they have :improved  Complexity of problems addressed: Patient's presentation is most consistent with  acute presentation with potential threat to life or bodily function  Disposition: After consideration of the diagnostic results and the patient's response to treatment,  I feel that the patent would benefit from admission   .    Low suspicion for acute spinal infection/discitis/epidural abscess this patient reports chronic back pain without any focal neurodeficit that she is moving all  extremities.       Final Clinical Impression(s) / ED Diagnoses Final diagnoses:  None    Rx / DC Orders ED Discharge Orders     None         Zadie Rhine, MD 01/29/23 0230

## 2023-01-29 NOTE — Evaluation (Signed)
Physical Therapy Evaluation Patient Details Name: Felicia Williams MRN: [id number] DOB: [birth year] Today's Date: 01/29/2023  History of Present Illness  Felicia Williams is a 82 y.o. female with medical history significant of ESRD, anemia, CHF, depression, hypertension, gastroparesis, GERD, mixed hyperlipidemia, and more presents the ED with a chief complaint of generalized weakness.  Patient is not able to participate in exam with me as she cannot wake up long enough to do it.  She was just given Ativan in the ED.  Daughter is at bedside.  She is a poor historian.  She reports that patient has been out of it, had dyspnea, and head pain.  Patient called a provider and asked if she could double her oxycodone and was told to go ahead and do that per the daughter's report.  Per the daughter, it still not controlling her pain and she moans all day long.  Daughter reports she has had shortness of breath for couple of months.  It got worse about a week ago.  She was put on oxygen recently.  She reports that patient is coughing a lot after meals..  They report that to the knowledge she has not had any fevers at home.  In the ER when she got here she did have a fever of 101.3.  Daughter cannot remember any more complaints that patient has had.   Clinical Impression  Patient functioning near baseline for functional mobility and gait other than having to use RW for longer distances due to having to lean on nearby objects for support, ambulated on room air with SpO2 at 90-93%, but once seated in chair dropped to 87% and put back on 2 LPM to recover above 90%.  Patient tolerated sitting up in chair after therapy. Patient will benefit from continued skilled physical therapy in hospital and recommended venue below to increase strength, balance, endurance for safe ADLs and gait.          Assistance Recommended at Discharge Set up Supervision/Assistance  If plan is discharge home, recommend the following:  Can  travel by private vehicle  A little help with walking and/or transfers;A little help with bathing/dressing/bathroom;Assistance with cooking/housework;Help with stairs or ramp for entrance        Equipment Recommendations None recommended by PT  Recommendations for Other Services       Functional Status Assessment Patient has had a recent decline in their functional status and demonstrates the ability to make significant improvements in function in a reasonable and predictable amount of time.     Precautions / Restrictions Precautions Precautions: Fall Restrictions Weight Bearing Restrictions: No      Mobility  Bed Mobility Overal bed mobility: Modified Independent                  Transfers Overall transfer level: Needs assistance Equipment used: Rolling walker (2 wheels), None Transfers: Sit to/from Stand, Bed to chair/wheelchair/BSC Sit to Stand: Supervision   Step pivot transfers: Supervision       General transfer comment: slightly unsteady with tendency to lean on nearby objects for support when not using an AD, safer using RW    Ambulation/Gait Ambulation/Gait assistance: Supervision Gait Distance (Feet): 75 Feet Assistive device: Rolling walker (2 wheels) Gait Pattern/deviations: Decreased step length - right, Decreased step length - left, Decreased stride length Gait velocity: decreased     General Gait Details: slightly labored cadence requiring use of RW for safety with good return for use demonstrated, no loss of balance  Stairs  Wheelchair Mobility     Tilt Bed    Modified Rankin (Stroke Patients Only)       Balance Overall balance assessment: Needs assistance Sitting-balance support: Feet supported, No upper extremity supported Sitting balance-Leahy Scale: Good Sitting balance - Comments: seated at EOB   Standing balance support: During functional activity, No upper extremity supported Standing balance-Leahy Scale:  Poor Standing balance comment: fair/poor without AD, fair/good using RW                             Pertinent Vitals/Pain Pain Assessment Pain Assessment: No/denies pain    Home Living Family/patient expects to be discharged to:: Private residence Living Arrangements: Alone Available Help at Discharge: Family;Available 24 hours/day Type of Home: House Home Access: Stairs to enter Entrance Stairs-Rails: None Entrance Stairs-Number of Steps: 1   Home Layout: Able to live on main level with bedroom/bathroom;Laundry or work area in [employer] Equipment: Gilmer Mor - single Librarian, academic (2 wheels);Wheelchair - manual      Prior Function Prior Level of Function : Independent/Modified Independent;Driving             Mobility Comments: Short distance community ambulator w/o AD, drives ADLs Comments: Independent, PRN assistance from family     Hand Dominance   Dominant Hand: Right    Extremity/Trunk Assessment   Upper Extremity Assessment Upper Extremity Assessment: Overall WFL for tasks assessed    Lower Extremity Assessment Lower Extremity Assessment: Generalized weakness    Cervical / Trunk Assessment Cervical / Trunk Assessment: Normal  Communication   Communication: No difficulties  Cognition Arousal/Alertness: Awake/alert Behavior During Therapy: WFL for tasks assessed/performed Overall Cognitive Status: Within Functional Limits for tasks assessed                                          General Comments      Exercises     Assessment/Plan    PT Assessment Patient needs continued PT services  PT Problem List Decreased strength;Decreased activity tolerance;Decreased balance;Decreased mobility       PT Treatment Interventions DME instruction;Gait training;Stair training;Functional mobility training;Therapeutic activities;Therapeutic exercise;Patient/family education;Balance training    PT Goals (Current goals can be  found in the Care Plan section)  Acute Rehab PT Goals Patient Stated Goal: return home with family to assist PT Goal Formulation: With patient Time For Goal Achievement: 02/01/23 Potential to Achieve Goals: Good    Frequency Min 3X/week     Co-evaluation               AM-PAC PT "6 Clicks" Mobility  Outcome Measure Help needed turning from your back to your side while in a flat bed without using bedrails?: None Help needed moving from lying on your back to sitting on the side of a flat bed without using bedrails?: None Help needed moving to and from a bed to a chair (including a wheelchair)?: A Little Help needed standing up from a chair using your arms (e.g., wheelchair or bedside chair)?: A Little Help needed to walk in hospital room?: A Little Help needed climbing 3-5 steps with a railing? : A Little 6 Click Score: 20    End of Session Equipment Utilized During Treatment: Oxygen Activity Tolerance: Patient tolerated treatment well;Patient limited by fatigue Patient left: in chair;with call bell/phone within reach Nurse Communication: Mobility status PT Visit Diagnosis:  Unsteadiness on feet (R26.81);Other abnormalities of gait and mobility (R26.89);Muscle weakness (generalized) (M62.81)    Time: 1610-9604 PT Time Calculation (min) (ACUTE ONLY): 27 min   Charges:   PT Evaluation $PT Eval Moderate Complexity: 1 Mod PT Treatments $Therapeutic Activity: 23-37 mins PT General Charges $$ ACUTE PT VISIT: 1 Visit         2:46 PM, 01/29/23 Ocie Bob, MPT Physical Therapist with [facility] [phone] office [phone] mobile phone

## 2023-01-29 NOTE — Evaluation (Signed)
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 Karilyn Cota.   GERD (gastroesophageal reflux disease)    Glomerulonephritis    Gout    Mixed hyperlipidemia    Spondylosis    Past Surgical History:  Past Surgical History:  Procedure Laterality Date   APPENDECTOMY     AV FISTULA PLACEMENT Left 03/20/2022   Procedure: INSERTION OF LEFT UPPER ARM ARTERIOVENOUS (AV) GORE-TEX GRAFT;  Surgeon: Larina Earthly, MD;  [facility]: [facility];  Service: Vascular;  Laterality: Left;   CATARACT EXTRACTION     COLONOSCOPY N/A 12/28/2015   Procedure: COLONOSCOPY;  Surgeon: Malissa Hippo, MD;  [facility];  Service: Endoscopy;  Laterality: N/A;  815   ESOPHAGOGASTRODUODENOSCOPY N/A 11/16/2020   Procedure: ESOPHAGOGASTRODUODENOSCOPY (EGD);  Surgeon: Malissa Hippo, MD;  [facility];  Service: Endoscopy;  Laterality: N/A;  1:15   EXCHANGE OF A DIALYSIS CATHETER N/A 03/20/2022   Procedure: EXCHANGE OF A DIALYSIS CATHETER;  Surgeon: Larina Earthly, MD;  Location: [facility];  Service: Vascular;  Laterality: N/A;   INSERTION OF DIALYSIS CATHETER Right 09/30/2021   Procedure: INSERTION OF TUNNELED DIALYSIS CATHETER;  Surgeon: Maeola Harman, MD;  Location: [facility] OR;  Service: Vascular;  Laterality: Right;   IR FLUORO GUIDE CV LINE RIGHT  01/16/2019   IR REMOVAL TUN CV CATH W/O FL  07/01/2019   IR US GUIDE VASC ACCESS RIGHT   01/16/2019   POLYPECTOMY  11/16/2020   Procedure: POLYPECTOMY;  Surgeon: Malissa Hippo, MD;  [facility];  Service: Endoscopy;;  gastric   REMOVAL OF A DIALYSIS CATHETER  09/30/2021   Procedure: REMOVAL OF A PERITONEAL DIALYSIS CATHETER;  Surgeon: Maeola Harman, MD;  Location: [facility] OR;  Service: Vascular;;   REMOVAL OF A DIALYSIS CATHETER N/A 05/22/2022   Procedure: MINOR REMOVAL OF A TUNNELED DIALYSIS CATHETER;  Surgeon: Larina Earthly, MD;  Location: [facility];  Service: Vascular;  Laterality: N/A;   TUBAL LIGATION     HPI:  Felicia Williams is a 82 y.o. female with medical history significant of ESRD, anemia, CHF, depression, hypertension, gastroparesis, GERD, mixed hyperlipidemia, and more presents the ED with a chief complaint of generalized weakness.  Patient is not able to participate in exam with me as she cannot wake up long enough to do it.  She was just given Ativan in the ED.  Daughter is at bedside.  She is a poor historian.  She reports that patient has been out of it, had dyspnea, and head pain.  Patient called a provider and asked if she could double her oxycodone and was told to go ahead and do that per the daughter's report.  Per the daughter, it still not controlling her pain and she moans all day long.  Daughter reports she has had shortness of breath for couple of months.  It got  worse about a week ago.  She was put on oxygen recently.  She reports that patient is coughing a lot after meals..  They report that to the knowledge she has not had any fevers at home.  In the ER when she got here she did have a fever of 101.3.  Daughter cannot remember any more complaints that patient has had. BSE requested    Assessment / Plan / Recommendation  Clinical Impression  Clinical swallowing evaluation completed while Pt was sitting upright in bed; Pt consumed thin liquids, puree textures and regular textures without overt s/sx of oropharyngeal dysphagia. Pt did report  episodes of coughing after eating; per chart review this was previously reported to ST last year as an inconsistent chronic cough. No coughing was noted during or immediately after swallowing but one brief episode of dry coughing was observed with a significant delay (60-90seconds) after a sip of water. Pt does report reflux and "not taking anything for it". Question if cough is related to reflux. Recommend consider OP GI consult to discuss symptoms and potential for medication to be of benefit for cough. Will defer treatment of possible esophageal symptoms to GI. Above reviewed with Pt, ST will sign off at this time. Thank you, SLP Visit Diagnosis: Dysphagia, unspecified (R13.10)    Aspiration Risk       Diet Recommendation Regular;Thin liquid    Liquid Administration via: Cup;Straw Medication Administration: Whole meds with liquid Supervision: Patient able to self feed Compensations: Minimize environmental distractions;Slow rate;Small sips/bites Postural Changes: Seated upright at 90 degrees    Other  Recommendations Recommended Consults: Consider GI evaluation (consider OUTPATIENT GI referral) Oral Care Recommendations: Oral care BID    Recommendations for follow up therapy are one component of a multi-disciplinary discharge planning process, led by the attending physician.  Recommendations may be updated based on patient status, additional functional criteria and insurance authorization.  Follow up Recommendations No SLP follow up        Swallow Study   General Date of Onset: 01/28/23 HPI: Felicia Williams is a 82 y.o. female with medical history significant of ESRD, anemia, CHF, depression, hypertension, gastroparesis, GERD, mixed hyperlipidemia, and more presents the ED with a chief complaint of generalized weakness.  Patient is not able to participate in exam with me as she cannot wake up long enough to do it.  She was just given Ativan in the ED.  Daughter is at bedside.  She is a  poor historian.  She reports that patient has been out of it, had dyspnea, and head pain.  Patient called a provider and asked if she could double her oxycodone and was told to go ahead and do that per the daughter's report.  Per the daughter, it still not controlling her pain and she moans all day long.  Daughter reports she has had shortness of breath for couple of months.  It got worse about a week ago.  She was put on oxygen recently.  She reports that patient is coughing a lot after meals..  They report that to the knowledge she has not had any fevers at home.  In the ER when she got here she did have a fever of 101.3.  Daughter cannot remember any more complaints that patient has had. BSE requested Type of Study: Bedside Swallow Evaluation Previous Swallow Assessment: BSE 09/01/21 Diet Prior to this Study: Regular;Thin liquids (Level 0) Temperature Spikes Noted: No Respiratory Status: Room air History of Recent Intubation: No Behavior/Cognition: Alert;Cooperative;Pleasant mood  Oral Cavity Assessment: Within Functional Limits Oral Care Completed by SLP: Recent completion by staff Oral Cavity - Dentition: Adequate natural dentition Vision: Functional for self-feeding Self-Feeding Abilities: Able to feed self Patient Positioning: Upright in bed Baseline Vocal Quality: Normal Volitional Cough: Strong Volitional Swallow: Able to elicit    Oral/Motor/Sensory Function Overall Oral Motor/Sensory Function: Within functional limits   Ice Chips Ice chips: Within functional limits   Thin Liquid Thin Liquid: Within functional limits    Nectar Thick Nectar Thick Liquid: Not tested   Honey Thick Honey Thick Liquid: Not tested   Puree Puree: Within functional limits   Solid     Solid: Within functional limits      Felicia Williams H. Romie Levee, CCC-SLP Speech Language Pathologist   Felicia Williams 01/29/2023,3:53 PM

## 2023-01-29 NOTE — ED Notes (Addendum)
ED TO INPATIENT HANDOFF REPORT  ED Nurse Name and Phone #: Johnney Killian Name/Age/Gender Felicia Williams 82 y.o. female Room/Bed: APA16A/APA16A  Code Status   Code Status: Prior  Home/SNF/Other Home Patient oriented to: self, place, time, and situation Is this baseline? Yes   Triage Complete: Triage complete  Chief Complaint CAP (community acquired pneumonia) [J18.9]  Triage Note Pt to ed pov c/o chronic back pain. Pt takes pain medication at home but pt states they knock her out instead of treating the pain. Pt had dialysis today and has CHF. Pt wears chronic 3L [place] and endorses fever in triage   Allergies Allergies  Allergen Reactions   Cefepime Other (See Comments)    Feb 2023 Encephalopathy with questionable seizures. Seems to be tolerating cefazolin    Morphine Other (See Comments)    "Dry heaving like crazy"   Sulfa Antibiotics Other (See Comments)    Shut pt's kidneys down    Level of Care/Admitting Diagnosis ED Disposition     ED Disposition  Admit   Condition  --   Comment  Hospital Area: [facility] [[id number]]  Level of Care: Telemetry [5]  Covid Evaluation: Symptomatic Person Under Investigation (PUI) or recent exposure (last 10 days) *Testing Required*  Diagnosis: CAP (community acquired pneumonia) [981191]  Admitting Physician: Lilyan Gilford [[id number]]  Attending Physician: Lilyan Gilford [[id number]]          B Medical/Surgery History Past Medical History:  Diagnosis Date   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 Karilyn Cota.   GERD (gastroesophageal reflux disease)    Glomerulonephritis    Gout    Mixed hyperlipidemia    Spondylosis    Past Surgical History:  Procedure  Laterality Date   APPENDECTOMY     AV FISTULA PLACEMENT Left 03/20/2022   Procedure: INSERTION OF LEFT UPPER ARM ARTERIOVENOUS (AV) GORE-TEX GRAFT;  Surgeon: Larina Earthly, MD;  Location: [facility];  Service: Vascular;  Laterality: Left;   CATARACT EXTRACTION     COLONOSCOPY N/A 12/28/2015   Procedure: COLONOSCOPY;  Surgeon: Malissa Hippo, MD;  [facility];  Service: Endoscopy;  Laterality: N/A;  815   ESOPHAGOGASTRODUODENOSCOPY N/A 11/16/2020   Procedure: ESOPHAGOGASTRODUODENOSCOPY (EGD);  Surgeon: Malissa Hippo, MD;  [facility];  Service: Endoscopy;  Laterality: N/A;  1:15   EXCHANGE OF A DIALYSIS CATHETER N/A 03/20/2022   Procedure: EXCHANGE OF A DIALYSIS CATHETER;  Surgeon: Larina Earthly, MD;  Location: [facility];  Service: Vascular;  Laterality: N/A;   INSERTION OF DIALYSIS CATHETER Right 09/30/2021   Procedure: INSERTION OF TUNNELED DIALYSIS CATHETER;  Surgeon: Maeola Harman, MD;  Location: [facility] OR;  Service: Vascular;  Laterality: Right;   IR FLUORO GUIDE CV LINE RIGHT  01/16/2019   IR REMOVAL TUN CV CATH W/O FL  07/01/2019   IR US GUIDE VASC ACCESS RIGHT  01/16/2019   POLYPECTOMY  11/16/2020   Procedure: POLYPECTOMY;  Surgeon: Malissa Hippo, MD;  [facility];  Service: Endoscopy;;  gastric   REMOVAL OF A DIALYSIS CATHETER  09/30/2021   Procedure: REMOVAL OF A PERITONEAL DIALYSIS CATHETER;  Surgeon: Maeola Harman, MD;  Location: [facility];  Service: Vascular;;   REMOVAL OF A DIALYSIS CATHETER N/A 05/22/2022   Procedure: MINOR REMOVAL OF A TUNNELED DIALYSIS CATHETER;  Surgeon: Larina Earthly, MD;  Location: [facility];  Service: Vascular;  Laterality: N/A;   TUBAL LIGATION       A IV Location/Drains/Wounds Patient Lines/Drains/Airways Status     Active Line/Drains/Airways     Name Placement date Placement time Site Days   Peripheral IV 01/06/23 22 G Posterior;Right Hand 01/06/23  0949  Hand  23   Peripheral IV 01/29/23 20 G  Anterior;Right Forearm 01/29/23  0143  Forearm  less than 1   Fistula / Graft Left Upper arm Arteriovenous vein graft --  --  Upper arm  --   Pressure Injury 04/22/22 Sacrum Right Stage 1 -  Intact skin with non-blanchable redness of a localized area usually over a bony prominence. 04/22/22  2330  -- 282            Intake/Output Last 24 hours  Intake/Output Summary (Last 24 hours) at 01/29/2023 0300 Last data filed at 01/29/2023 0224 Gross per 24 hour  Intake 100 ml  Output --  Net 100 ml    Labs/Imaging Results for orders placed or performed during the hospital encounter of 01/28/23 (from the past 48 hour(s))  Comprehensive metabolic panel     Status: Abnormal   Collection Time: 01/29/23 12:32 AM  Result Value Ref Range   Sodium 136 135 - 145 mmol/L   Potassium 3.2 (L) 3.5 - 5.1 mmol/L   Chloride 96 (L) 98 - 111 mmol/L   CO2 28 22 - 32 mmol/L   Glucose, Bld 90 70 - 99 mg/dL    Comment: Glucose reference range applies only to samples taken after fasting for at least 8 hours.   BUN 28 (H) 8 - 23 mg/dL   Creatinine, Ser 6.44 (H) 0.44 - 1.00 mg/dL   Calcium 7.6 (L) 8.9 - 10.3 mg/dL   Total Protein 6.6 6.5 - 8.1 g/dL   Albumin 2.4 (L) 3.5 - 5.0 g/dL   AST 28 15 - 41 U/L   ALT 18 0 - 44 U/L   Alkaline Phosphatase 92 38 - 126 U/L   Total Bilirubin 0.5 0.3 - 1.2 mg/dL   GFR, Estimated 10 (L) >60 mL/min    Comment: (NOTE) Calculated using the CKD-EPI Creatinine Equation (2021)    Anion gap 12 5 - 15    Comment: Performed at [facility], [address], [place], [place] [address]  Lactic acid, plasma     Status: None   Collection Time: 01/29/23 12:32 AM  Result Value Ref Range   Lactic Acid, Venous 0.9 0.5 - 1.9 mmol/L    Comment: Performed at [facility], [address], [place], [place] [address]  CBC with Differential     Status: Abnormal   Collection Time: 01/29/23 12:32 AM  Result Value Ref Range   WBC 11.0 (H) 4.0 - 10.5 K/uL   RBC 2.78 (L) 3.87 - 5.11 MIL/uL    Hemoglobin 8.2 (L) 12.0 - 15.0 g/dL   HCT 38.7 (L) 56.4 - 33.2 %   MCV 93.2 80.0 - 100.0 fL   MCH 29.5 26.0 - 34.0 pg   MCHC 31.7 30.0 - 36.0 g/dL   RDW 95.1 (H) 88.4 - 16.6 %   Platelets 305 150 - 400 K/uL   nRBC 0.0 0.0 - 0.2 %   Neutrophils Relative % 85 %   Neutro Abs 9.4 (  H) 1.7 - 7.7 K/uL   Lymphocytes Relative 6 %   Lymphs Abs 0.6 (L) 0.7 - 4.0 K/uL   Monocytes Relative 5 %   Monocytes Absolute 0.6 0.1 - 1.0 K/uL   Eosinophils Relative 3 %   Eosinophils Absolute 0.3 0.0 - 0.5 K/uL   Basophils Relative 0 %   Basophils Absolute 0.0 0.0 - 0.1 K/uL   Immature Granulocytes 1 %   Abs Immature Granulocytes 0.06 0.00 - 0.07 K/uL    Comment: Performed at [facility], [address] [place], [place] [address]  Protime-INR     Status: Abnormal   Collection Time: 01/29/23 12:32 AM  Result Value Ref Range   Prothrombin Time 15.5 (H) 11.4 - 15.2 seconds   INR 1.2 0.8 - 1.2    Comment: (NOTE) INR goal varies based on device and disease states. Performed at [facility], [address] [place], [place] [address]    DG Chest Portable 1 View  Result Date: 01/29/2023 CLINICAL DATA:  Fever, questionable sepsis EXAM: PORTABLE CHEST 1 VIEW COMPARISON:  01/06/2023 FINDINGS: Heart is borderline in size. Aortic atherosclerosis. Diffuse interstitial prominence with bilateral lower lobe airspace opacities and layering effusions. Favor edema although infection is not excluded. Airspace opacities and effusions have worsened since prior study. IMPRESSION: Diffuse interstitial prominence with lower lobe airspace opacities and layering effusions. Favor edema although infection is not excluded. Electronically Signed   By: Charlett Nose M.D.   On: 01/29/2023 00:02    Pending Labs Unresulted Labs (From admission, onward)     Start     Ordered   01/29/23 0224  SARS Coronavirus 2 by RT PCR (hospital order, performed in [facility] hospital lab) *cepheid single result test* Anterior Nasal Swab  (Tier 2 - SARS  Coronavirus 2 by RT PCR (hospital order, performed in [facility] hospital lab) *cepheid single result test*)  Once,   R        01/29/23 0224   01/28/23 2303  Culture, blood (Routine x 2)  BLOOD CULTURE X 2,   R (with STAT occurrences)      01/28/23 2303            Vitals/Pain Today's Vitals   01/28/23 2257 01/28/23 2259 01/28/23 2300 01/29/23 0251  BP: (!) 158/58     Pulse: 76     Resp: 20     Temp: (!) 101.3 F (38.5 C)   97.8 F (36.6 C)  TempSrc: Oral   Oral  SpO2: 98%     Weight:   43.1 kg   Height:   4\' 11"  (1.499 m)   PainSc:  10-Worst pain ever      Isolation Precautions Airborne and Contact precautions  Medications Medications  azithromycin (ZITHROMAX) 500 mg in sodium chloride 0.9 % 250 mL IVPB (500 mg Intravenous New Bag/Given 01/29/23 0250)  LORazepam (ATIVAN) tablet 1 mg (1 mg Oral Given 01/29/23 0044)  acetaminophen (TYLENOL) tablet 650 mg (650 mg Oral Given 01/29/23 0044)  cefTRIAXone (ROCEPHIN) 1 g in sodium chloride 0.9 % 100 mL IVPB (0 g Intravenous Stopped 01/29/23 0224)    Mobility walks     Focused Assessments Pulmonary Assessment Handoff:  Lung sounds:          R Recommendations: See Admitting Provider Note  Report given to:   Additional Notes: wears 3LNC home O2

## 2023-01-30 DIAGNOSIS — D509 Iron deficiency anemia, unspecified: Secondary | ICD-10-CM | POA: Diagnosis present

## 2023-01-30 DIAGNOSIS — F32A Depression, unspecified: Secondary | ICD-10-CM | POA: Diagnosis present

## 2023-01-30 DIAGNOSIS — N2581 Secondary hyperparathyroidism of renal origin: Secondary | ICD-10-CM | POA: Diagnosis present

## 2023-01-30 DIAGNOSIS — I120 Hypertensive chronic kidney disease with stage 5 chronic kidney disease or end stage renal disease: Secondary | ICD-10-CM | POA: Diagnosis present

## 2023-01-30 DIAGNOSIS — E876 Hypokalemia: Secondary | ICD-10-CM | POA: Diagnosis present

## 2023-01-30 DIAGNOSIS — F1120 Opioid dependence, uncomplicated: Secondary | ICD-10-CM | POA: Diagnosis present

## 2023-01-30 DIAGNOSIS — R627 Adult failure to thrive: Secondary | ICD-10-CM | POA: Diagnosis present

## 2023-01-30 DIAGNOSIS — R531 Weakness: Secondary | ICD-10-CM | POA: Diagnosis not present

## 2023-01-30 DIAGNOSIS — I10 Essential (primary) hypertension: Secondary | ICD-10-CM | POA: Diagnosis not present

## 2023-01-30 DIAGNOSIS — K2100 Gastro-esophageal reflux disease with esophagitis, without bleeding: Secondary | ICD-10-CM | POA: Diagnosis not present

## 2023-01-30 DIAGNOSIS — N186 End stage renal disease: Secondary | ICD-10-CM | POA: Diagnosis present

## 2023-01-30 DIAGNOSIS — Y832 Surgical operation with anastomosis, bypass or graft as the cause of abnormal reaction of the patient, or of later complication, without mention of misadventure at the time of the procedure: Secondary | ICD-10-CM | POA: Diagnosis present

## 2023-01-30 DIAGNOSIS — Z681 Body mass index (BMI) 19 or less, adult: Secondary | ICD-10-CM | POA: Diagnosis not present

## 2023-01-30 DIAGNOSIS — Y92009 Unspecified place in unspecified non-institutional (private) residence as the place of occurrence of the external cause: Secondary | ICD-10-CM | POA: Diagnosis not present

## 2023-01-30 DIAGNOSIS — J90 Pleural effusion, not elsewhere classified: Secondary | ICD-10-CM | POA: Diagnosis present

## 2023-01-30 DIAGNOSIS — G319 Degenerative disease of nervous system, unspecified: Secondary | ICD-10-CM | POA: Diagnosis present

## 2023-01-30 DIAGNOSIS — L89151 Pressure ulcer of sacral region, stage 1: Secondary | ICD-10-CM | POA: Diagnosis present

## 2023-01-30 DIAGNOSIS — J181 Lobar pneumonia, unspecified organism: Secondary | ICD-10-CM | POA: Diagnosis not present

## 2023-01-30 DIAGNOSIS — Z992 Dependence on renal dialysis: Secondary | ICD-10-CM | POA: Diagnosis not present

## 2023-01-30 DIAGNOSIS — Z1152 Encounter for screening for COVID-19: Secondary | ICD-10-CM | POA: Diagnosis not present

## 2023-01-30 DIAGNOSIS — J9621 Acute and chronic respiratory failure with hypoxia: Secondary | ICD-10-CM | POA: Diagnosis present

## 2023-01-30 DIAGNOSIS — G9341 Metabolic encephalopathy: Secondary | ICD-10-CM | POA: Diagnosis not present

## 2023-01-30 DIAGNOSIS — D631 Anemia in chronic kidney disease: Secondary | ICD-10-CM | POA: Diagnosis present

## 2023-01-30 DIAGNOSIS — J9601 Acute respiratory failure with hypoxia: Secondary | ICD-10-CM | POA: Diagnosis not present

## 2023-01-30 DIAGNOSIS — K3184 Gastroparesis: Secondary | ICD-10-CM | POA: Diagnosis not present

## 2023-01-30 DIAGNOSIS — A419 Sepsis, unspecified organism: Secondary | ICD-10-CM

## 2023-01-30 DIAGNOSIS — R1114 Bilious vomiting: Secondary | ICD-10-CM | POA: Diagnosis not present

## 2023-01-30 DIAGNOSIS — F05 Delirium due to known physiological condition: Secondary | ICD-10-CM | POA: Diagnosis not present

## 2023-01-30 DIAGNOSIS — M545 Low back pain, unspecified: Secondary | ICD-10-CM | POA: Diagnosis not present

## 2023-01-30 DIAGNOSIS — G8929 Other chronic pain: Secondary | ICD-10-CM | POA: Diagnosis not present

## 2023-01-30 DIAGNOSIS — R6520 Severe sepsis without septic shock: Secondary | ICD-10-CM | POA: Diagnosis present

## 2023-01-30 DIAGNOSIS — J189 Pneumonia, unspecified organism: Secondary | ICD-10-CM | POA: Diagnosis present

## 2023-01-30 DIAGNOSIS — T82858A Stenosis of vascular prosthetic devices, implants and grafts, initial encounter: Secondary | ICD-10-CM | POA: Diagnosis present

## 2023-01-30 DIAGNOSIS — E782 Mixed hyperlipidemia: Secondary | ICD-10-CM | POA: Diagnosis present

## 2023-01-30 DIAGNOSIS — R9431 Abnormal electrocardiogram [ECG] [EKG]: Secondary | ICD-10-CM | POA: Diagnosis not present

## 2023-01-30 DIAGNOSIS — G928 Other toxic encephalopathy: Secondary | ICD-10-CM | POA: Diagnosis present

## 2023-01-30 LAB — COMPREHENSIVE METABOLIC PANEL WITH GFR
ALT: 20 U/L (ref 0–44)
AST: 35 U/L (ref 15–41)
Albumin: 2.1 g/dL — ABNORMAL LOW (ref 3.5–5.0)
Alkaline Phosphatase: 85 U/L (ref 38–126)
Anion gap: 12 (ref 5–15)
BUN: 53 mg/dL — ABNORMAL HIGH (ref 8–23)
CO2: 26 mmol/L (ref 22–32)
Calcium: 7.9 mg/dL — ABNORMAL LOW (ref 8.9–10.3)
Chloride: 98 mmol/L (ref 98–111)
Creatinine, Ser: 6.01 mg/dL — ABNORMAL HIGH (ref 0.44–1.00)
GFR, Estimated: 7 mL/min — ABNORMAL LOW
Glucose, Bld: 113 mg/dL — ABNORMAL HIGH (ref 70–99)
Potassium: 4.3 mmol/L (ref 3.5–5.1)
Sodium: 136 mmol/L (ref 135–145)
Total Bilirubin: 0.4 mg/dL (ref 0.3–1.2)
Total Protein: 6.2 g/dL — ABNORMAL LOW (ref 6.5–8.1)

## 2023-01-30 LAB — CBC
HCT: 24.6 % — ABNORMAL LOW (ref 36.0–46.0)
Hemoglobin: 7.4 g/dL — ABNORMAL LOW (ref 12.0–15.0)
MCH: 28.8 pg (ref 26.0–34.0)
MCHC: 30.1 g/dL (ref 30.0–36.0)
MCV: 95.7 fL (ref 80.0–100.0)
Platelets: 300 10*3/uL (ref 150–400)
RBC: 2.57 MIL/uL — ABNORMAL LOW (ref 3.87–5.11)
RDW: 16.3 % — ABNORMAL HIGH (ref 11.5–15.5)
WBC: 8.5 10*3/uL (ref 4.0–10.5)
nRBC: 0 % (ref 0.0–0.2)

## 2023-01-30 LAB — MAGNESIUM: Magnesium: 2.2 mg/dL (ref 1.7–2.4)

## 2023-01-30 NOTE — Progress Notes (Signed)
Patient c/o pain 10/10. Her tailbone has bothered her throughout the night. States it is sore. Patient unable to get comfortable. Repositioned in bed, gotten up to the chair. Sacrum foam applied. PO pain med given.

## 2023-01-30 NOTE — Progress Notes (Signed)
PROGRESS NOTE    Felicia Williams  IHK:[id number] DOB: 1940/09/08 DOA: 01/28/2023 PCP: Rebekah Chesterfield, NP   Brief Narrative:  Felicia Williams is a 82 y.o. female with medical history significant of ESRD, anemia, CHF, depression, hypertension, gastroparesis, GERD, mixed hyperlipidemia, and more presents the ED with a chief complaint of generalized weakness and ambulatory dysfunction.  Imaging in the ED remarkable for pneumonia.  Hospitalist called for admission.    Assessment & Plan:   Principal Problem:   CAP (community acquired pneumonia) Active Problems:   ESRD on dialysis [facility])   Essential hypertension, benign   Acute metabolic encephalopathy   Hypokalemia   Generalized weakness   GERD (gastroesophageal reflux disease)   Prolonged QT interval  Sepsis in setting of community-acquired pneumonia, POA  -Noted to have fever, leukocytosis, tachycardia with obvious source of infection given abnormal chest x-ray -Continue ceftriaxone, azithromycin x 5 days, cultures pending -No indication at this time for steroids, follow closely, if respiratory status does not improve will initiate 24 hours   ESRD on dialysis [facility]) -Nephrology following, dialysis per their expertise   Essential hypertension, benign -Well-controlled on carvedilol, hydralazine   Prolonged QT interval -QTc 553, avoid QT prolonging medications -Repeat EKG tomorrow -Complicated by hypokalemia/ESRD   GERD (gastroesophageal reflux disease) - Continue Pepcid   Generalized weakness -Likely secondary to above infection, follow clinically -Rule out polypharmacy -continue to hold home narcotics/CNS depressants   Hypokalemia -To be managed by dialysis/nephrology  Acute metabolic encephalopathy Rule out polypharmacy -Nonspecific findings per daughter who notes patient's oxycodone dose had recently increased -Sepsis likely also playing a role -Complicated by benzodiazepine administration in the ED -Hold CNS  depressants, follow mental status, appears to be improving over the past 12 hours consider restarting low-dose oxycodone/trazodone pending mental status in the next 24 hours   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  -Appears to be at baseline, continue to follow clinically -Patient previously on iron, med rec no longer contains this medication, defer to outpatient workup for chronic anemia treatment given stable hemoglobin  Pressure injury, POA -See nursing documentation for ongoing staging and location, stage I right sacral area  DVT prophylaxis: heparin injection 5,000 Units Start: 01/29/23 0600 SCDs Start: 01/29/23 0423 Code Status:   Code Status: Full Code Family Communication: None present  Status is: Inpatient  Dispo: The patient is from: Home              Anticipated d/c is to: Home              Anticipated d/c date is: 24 to 48 hours              Patient currently not medically stable for discharge given ongoing need for IV antibiotics, patient has not yet returned back to baseline given her altered mental status  Consultants:  Nephrology  Procedures:  None  Antimicrobials:  Azithromycin, ceftriaxone x 5 days  Subjective: No acute issues or events overnight, patient's mental status appears to be improving but not yet back to baseline she denies any nausea vomiting diarrhea constipation headache fevers chills or chest pain  Objective: Vitals:   01/29/23 2222 01/29/23 2223 01/30/23 0124 01/30/23 0512  BP: (!) 144/54  (!) 127/57 (!) 156/57  Pulse:  76    Resp:    18  Temp:    98.1 F (36.7 C)  TempSrc:      SpO2:    100%  Weight:      Height:  Intake/Output Summary (Last 24 hours) at 01/30/2023 0725 Last data filed at 01/30/2023 0500 Gross per 24 hour  Intake 1195.4 ml  Output --  Net 1195.4 ml   Filed Weights   01/28/23 2300 01/29/23 0500  Weight: 43.1 kg 44.5 kg    Examination:  General:  Pleasantly resting in bed,  No acute distress. HEENT:  Normocephalic atraumatic.  Sclerae nonicteric, noninjected.  Extraocular movements intact bilaterally. Neck:  Without mass or deformity.  Trachea is midline. Lungs:  Clear to auscultate bilaterally without rhonchi, wheeze, or rales. Heart:  Regular rate and rhythm.  Without murmurs, rubs, or gallops. Abdomen:  Soft, nontender, nondistended.  Without guarding or rebound. Extremities: Without cyanosis, clubbing, edema, or obvious deformity. Skin:  Warm and dry, no erythema.  Data Reviewed: I have personally reviewed following labs and imaging studies  CBC: Recent Labs  Lab 01/29/23 0032 01/29/23 0444 01/30/23 0407  WBC 11.0* 8.9 8.5  NEUTROABS 9.4* 7.3  --   HGB 8.2* 7.1* 7.4*  HCT 25.9* 22.4* 24.6*  MCV 93.2 92.6 95.7  PLT 305 279 300   Basic Metabolic Panel: Recent Labs  Lab 01/29/23 0032 01/29/23 0444 01/30/23 0407  NA 136 136 136  K 3.2* 3.0* 4.3  CL 96* 98 98  CO2 28 26 26   GLUCOSE 90 83 113*  BUN 28* 31* 53*  CREATININE 4.13* 4.46* 6.01*  CALCIUM 7.6* 7.4* 7.9*  MG  --  1.9 2.2   GFR: Estimated Creatinine Clearance: 4.9 mL/min (A) (by C-G formula based on SCr of 6.01 mg/dL (H)). Liver Function Tests: Recent Labs  Lab 01/29/23 0032 01/29/23 0444 01/30/23 0407  AST 28 24 35  ALT 18 16 20   ALKPHOS 92 75 85  BILITOT 0.5 0.7 0.4  PROT 6.6 5.6* 6.2*  ALBUMIN 2.4* 2.0* 2.1*   Coagulation Profile: Recent Labs  Lab 01/29/23 0032  INR 1.2   Sepsis Labs: Recent Labs  Lab 01/29/23 0032  LATICACIDVEN 0.9    Recent Results (from the past 240 hour(s))  Culture, blood (Routine x 2)     Status: None (Preliminary result)   Collection Time: 01/29/23 12:32 AM   Specimen: BLOOD RIGHT ARM  Result Value Ref Range Status   Specimen Description BLOOD RIGHT ARM  Final   Special Requests   Final    BOTTLES DRAWN AEROBIC AND ANAEROBIC Blood Culture adequate volume   Culture   Final    NO GROWTH < 12 HOURS Performed at [facility], [address], [place], [place] [address]    Report Status PENDING  Incomplete  Culture, blood (Routine x 2)     Status: None (Preliminary result)   Collection Time: 01/29/23 12:32 AM   Specimen: BLOOD RIGHT HAND  Result Value Ref Range Status   Specimen Description BLOOD RIGHT HAND  Final   Special Requests   Final    BOTTLES DRAWN AEROBIC AND ANAEROBIC Blood Culture adequate volume   Culture   Final    NO GROWTH < 12 HOURS Performed at [facility] [address] [place], [place] [address]    Report Status PENDING  Incomplete  SARS Coronavirus 2 by RT PCR (hospital order, performed in [facility] hospital lab) *cepheid single result test* Anterior Nasal Swab     Status: None   Collection Time: 01/29/23  2:28 AM   Specimen: Anterior Nasal Swab  Result Value Ref Range Status   SARS Coronavirus 2 by RT PCR NEGATIVE NEGATIVE Final    Comment: (NOTE)  SARS-CoV-2 target nucleic acids are NOT DETECTED.  The SARS-CoV-2 RNA is generally detectable in upper and lower respiratory specimens during the acute phase of infection. The lowest concentration of SARS-CoV-2 viral copies this assay can detect is 250 copies / mL. A negative result does not preclude SARS-CoV-2 infection and should not be used as the sole basis for treatment or other patient management decisions.  A negative result may occur with improper specimen collection / handling, submission of specimen other than nasopharyngeal swab, presence of viral mutation(s) within the areas targeted by this assay, and inadequate number of viral copies (<250 copies / mL). A negative result must be combined with clinical observations, patient history, and epidemiological information.  Fact Sheet for Patients:   RoadLapTop.co.za  Fact Sheet for Healthcare Providers: http://kim-miller.com/  This test is not yet approved or  cleared by the [place] FDA and has been authorized for  detection and/or diagnosis of SARS-CoV-2 by FDA under an Emergency Use Authorization (EUA).  This EUA will remain in effect (meaning this test can be used) for the duration of the COVID-19 declaration under Section 564(b)(1) of the Act, 21 U.S.C. section 360bbb-3(b)(1), unless the authorization is terminated or revoked sooner.  Performed at [facility], [address], [place], [place] [address]    Radiology Studies: DG Chest Portable 1 View  Result Date: 01/29/2023 CLINICAL DATA:  Fever, questionable sepsis EXAM: PORTABLE CHEST 1 VIEW COMPARISON:  01/06/2023 FINDINGS: Heart is borderline in size. Aortic atherosclerosis. Diffuse interstitial prominence with bilateral lower lobe airspace opacities and layering effusions. Favor edema although infection is not excluded. Airspace opacities and effusions have worsened since prior study. IMPRESSION: Diffuse interstitial prominence with lower lobe airspace opacities and layering effusions. Favor edema although infection is not excluded. Electronically Signed   By: Charlett Nose M.D.   On: 01/29/2023 00:02    Scheduled Meds:  carvedilol  12.5 mg Oral BID   Chlorhexidine Gluconate Cloth  6 each Topical Q0600   heparin  5,000 Units Subcutaneous Q8H   hydrALAZINE  50 mg Oral BID   Continuous Infusions:  azithromycin Stopped (01/30/23 0331)   cefTRIAXone (ROCEPHIN)  IV Stopped (01/30/23 0053)     LOS: 0 days   Time spent:  Azucena Fallen, DO [facility]  If 7PM-7AM, please contact night-coverage [email or web address]  01/30/2023, 7:25 AM

## 2023-01-30 NOTE — Progress Notes (Signed)
   01/30/23 1300  Vitals  Temp 98.3 F (36.8 C)  Temp Source Oral  BP 113/60  BP Location Right Arm  BP Method Automatic  Patient Position (if appropriate) Lying  Pulse Rate 68  Resp 20  Oxygen Therapy  SpO2 100 %  O2 Device Nasal Cannula  O2 Flow Rate (L/min) 2 L/min  During Treatment Monitoring  Intra-Hemodialysis Comments Tx completed  Post Treatment  Dialyzer Clearance Lightly streaked  Duration of HD Treatment -hour(s) 3.25 hour(s)  Hemodialysis Intake (mL) 0 mL  Liters Processed 78  Fluid Removed (mL) 1600 mL  Tolerated HD Treatment Yes  Post-Hemodialysis Comments Pt goal met.  AVG/AVF Arterial Site Held (minutes) 10 minutes  AVG/AVF Venous Site Held (minutes) 10 minutes  Fistula / Graft Left Upper arm Arteriovenous vein graft  No placement date or time found.   Placed prior to admission: Yes  Orientation: Left  Access Location: Upper arm  Access Type: Arteriovenous vein graft  Site Condition No complications  Fistula / Graft Assessment Present;Thrill;Bruit  Status Deaccessed  Needle Size 15  Drainage Description None

## 2023-01-30 NOTE — Care Management Obs Status (Signed)
MEDICARE OBSERVATION STATUS NOTIFICATION   Patient Details  Name: Felicia Williams MRN: [id number] Date of Birth: 08/25/1940   Medicare Observation Status Notification Given:  Yes    Corey Harold 01/30/2023, 12:48 PM

## 2023-01-30 NOTE — Procedures (Signed)
I was present at this dialysis session. I have reviewed the session itself and made appropriate changes.   Vital signs in last 24 hours:  Temp:  [98.1 F (36.7 C)-100.1 F (37.8 C)] 98.4 F (36.9 C) (07/10 0942) Pulse Rate:  [66-83] 76 (07/09 2223) Resp:  [18-27] 27 (07/10 0942) BP: (126-156)/(49-72) 135/72 (07/10 0942) SpO2:  [98 %-100 %] 100 % (07/10 0942) Weight:  [45 kg] 45 kg (07/10 1002) Weight change:  Filed Weights   01/28/23 2300 01/29/23 0500 01/30/23 1002  Weight: 43.1 kg 44.5 kg 45 kg    Recent Labs  Lab 01/30/23 0407  NA 136  K 4.3  CL 98  CO2 26  GLUCOSE 113*  BUN 53*  CREATININE 6.01*  CALCIUM 7.9*    Recent Labs  Lab 01/29/23 0032 01/29/23 0444 01/30/23 0407  WBC 11.0* 8.9 8.5  NEUTROABS 9.4* 7.3  --   HGB 8.2* 7.1* 7.4*  HCT 25.9* 22.4* 24.6*  MCV 93.2 92.6 95.7  PLT 305 279 300    Scheduled Meds:  carvedilol  12.5 mg Oral BID   Chlorhexidine Gluconate Cloth  6 each Topical Q0600   heparin  5,000 Units Subcutaneous Q8H   hydrALAZINE  50 mg Oral BID   Continuous Infusions:  azithromycin Stopped (01/30/23 0331)   cefTRIAXone (ROCEPHIN)  IV Stopped (01/30/23 0053)   PRN Meds:.acetaminophen **OR** acetaminophen, loperamide, ondansetron **OR** ondansetron (ZOFRAN) IV, oxyCODONE   Irena Cords,  MD 01/30/2023, 10:06 AM

## 2023-01-30 NOTE — Progress Notes (Addendum)
Pt was at dialysis for most of the day. No c/o pain or discomfort upon return. Plan of care ongoing.

## 2023-01-31 DIAGNOSIS — G9341 Metabolic encephalopathy: Secondary | ICD-10-CM | POA: Diagnosis not present

## 2023-01-31 DIAGNOSIS — R531 Weakness: Secondary | ICD-10-CM | POA: Diagnosis not present

## 2023-01-31 DIAGNOSIS — N186 End stage renal disease: Secondary | ICD-10-CM | POA: Diagnosis not present

## 2023-01-31 DIAGNOSIS — J189 Pneumonia, unspecified organism: Secondary | ICD-10-CM | POA: Diagnosis not present

## 2023-01-31 MED ORDER — PROSOURCE PLUS PO LIQD
30.0000 mL | Freq: Two times a day (BID) | ORAL | Status: DC
  Administered 2023-01-31 – 2023-02-05 (×4): 30 mL via ORAL
  Filled 2023-01-31 (×9): qty 30

## 2023-01-31 MED ORDER — AZITHROMYCIN 250 MG PO TABS
500.0000 mg | ORAL_TABLET | Freq: Every day | ORAL | Status: AC
  Administered 2023-01-31 – 2023-02-01 (×2): 500 mg via ORAL
  Filled 2023-01-31 (×2): qty 2

## 2023-01-31 NOTE — Progress Notes (Signed)
[facility] Progress Note   Assessment/ Plan:   Dialysis Orders: Center: [name]  on MWF . EDW 43.5kg HD Bath 1K/2.5Ca  Time 3:15 Heparin 1000 unit bolus then 1200 units/hr. Access LAVG BFR 400 DFR 500  Profile 2        Assessment/Plan:  CAP - started on Rocephin and Zithromax per primary svc  ESRD -  Continue with HD on MWF schedule  Hypertension/volume  -  stable and UF as tolerated with HD tomorrow  Anemia  - Hgb dropping to 7.1.  would benefit from blood transfusion.  Unclear source.  She denies any hematochezia, melena, or BRBPR.  Continue to follow and will dose with ESA.  Metabolic bone disease -  continue with home meds  Nutrition - renal diet  Acute metabolic encephalopathy - resolved  Prolonged QT interval - per primary.  Currently on telemetry  Hypokalemia - resolved FTT: discussed with pt and dtr.   Dispo: pending  Subjective:    S/p HD yesterday, UF to EDW.  Dtr Pam at bedside today.  We had a long discussion today about diet and nutrition- pt has lost > 30 lbs in the last year. She's pretty picky and is having difficulty with protein intake and lowering sodium intake on the renal diet.  I directed her to recipe books on [facility] website and we reviewed together.  Also gave her a couple of ideas on how to get plant-based protein powder into some meals/ snacks.    Objective:   BP (!) 141/61 (BP Location: Right Arm)   Pulse 77   Temp 98.5 F (36.9 C)   Resp (!) 21   Ht 4\' 11"  (1.499 m)   Wt 43.5 kg   SpO2 96%   BMI 19.37 kg/m   Physical Exam: Gen:NAD, sitting in bed CVS: RRR Resp: clear Abd: soft Ext: no LE edema ACCESS: AVF  Labs: BMET Recent Labs  Lab 01/29/23 0032 01/29/23 0444 01/30/23 0407  NA 136 136 136  K 3.2* 3.0* 4.3  CL 96* 98 98  CO2 28 26 26   GLUCOSE 90 83 113*  BUN 28* 31* 53*  CREATININE 4.13* 4.46* 6.01*  CALCIUM 7.6* 7.4* 7.9*   CBC Recent Labs  Lab 01/29/23 0032 01/29/23 0444 01/30/23 0407  WBC 11.0*  8.9 8.5  NEUTROABS 9.4* 7.3  --   HGB 8.2* 7.1* 7.4*  HCT 25.9* 22.4* 24.6*  MCV 93.2 92.6 95.7  PLT 305 279 300      Medications:     (feeding supplement) PROSource Plus  30 mL Oral BID BM   carvedilol  12.5 mg Oral BID   Chlorhexidine Gluconate Cloth  6 each Topical Q0600   heparin  5,000 Units Subcutaneous Q8H   hydrALAZINE  50 mg Oral BID     Bufford Buttner MD 01/31/2023, 10:23 AM

## 2023-01-31 NOTE — TOC Progression Note (Addendum)
Transition of Care [facility]) - Progression Note    Patient Details  Name: Felicia Williams MRN: [id number] Date of Birth: [birth year]  Transition of Care [facility] CM/SW Contact  Villa Her[place], [place] Phone Number: 01/31/2023, 9:54 AM  Clinical Narrative:    CSW spoke with pt to follow up on if she has changed her mind about interest in [facility] PT being set up. Pt states that at this time she is still not interested and feels she can manage well without these services. TOC to follow.   Expected Discharge Plan: Home/Self Care Barriers to Discharge: No Barriers Identified  Expected Discharge Plan and Services In-house Referral: Clinical Social Work Discharge Planning Services: CM Consult   Living arrangements for the past 2 months: Single Family Home                                       Social Determinants of Health (SDOH) Interventions SDOH Screenings   Food Insecurity: No Food Insecurity (01/29/2023)  Housing: Low Risk  (01/29/2023)  Transportation Needs: No Transportation Needs (01/29/2023)  Utilities: Not At Risk (01/29/2023)  Tobacco Use: Low Risk  (01/28/2023)    Readmission Risk Interventions    01/31/2023    9:54 AM 10/25/2022    8:06 AM 10/15/2022   12:28 PM  Readmission Risk Prevention Plan  Transportation Screening Complete Complete Complete  PCP or Specialist Appt within 3-5 Days   Complete  HRI or Home Care Consult Complete Complete Complete  Social Work Consult for Recovery Care Planning/Counseling Complete Complete Complete  Palliative Care Screening Not Applicable Not Applicable Not Applicable  Medication Review Oceanographer) Complete Complete Complete

## 2023-01-31 NOTE — Progress Notes (Addendum)
EKG done. Placed in patient chart.

## 2023-01-31 NOTE — Progress Notes (Signed)
PROGRESS NOTE    [name]  ZOX:[id number] DOB: 1941/02/06 DOA: 01/28/2023 PCP: Rebekah Chesterfield, NP   Brief Narrative:  Felicia Williams is a 82 y.o. female with medical history significant of ESRD, anemia, CHF, depression, hypertension, gastroparesis, GERD, mixed hyperlipidemia, and more presents the ED with a chief complaint of generalized weakness and ambulatory dysfunction.  Imaging in the ED remarkable for pneumonia.  Hospitalist called for admission.    Assessment & Plan:   Principal Problem:   CAP (community acquired pneumonia) Active Problems:   ESRD on dialysis [facility])   Essential hypertension, benign   Acute metabolic encephalopathy   Hypokalemia   Generalized weakness   GERD (gastroesophageal reflux disease)   Prolonged QT interval   Sepsis due to pneumonia (HCC)  Sepsis in setting of community-acquired pneumonia, POA Acute on chronic respiratory failure, POA -Noted to have fever, leukocytosis, tachycardia with obvious source of infection given abnormal chest x-ray -On 2 L nasal cannula at baseline - required 3L initially but currently on 1L at rest - ambulatory hypoxia ongoing -Continue ceftriaxone, azithromycin x 5 days, cultures pending -No indication at this time for steroids, follow closely, if respiratory status does not improve will initiate 24 hours   ESRD on dialysis [facility]) -Nephrology following, dialysis per their expertise   Essential hypertension, benign -Well-controlled on carvedilol, hydralazine   Prolonged QT interval, resolved -QTc 471 7/11 -Complicated by hypokalemia/ESRD   GERD (gastroesophageal reflux disease) - Continue Pepcid   Generalized weakness -Likely secondary to above infection, follow clinically -Rule out polypharmacy -continue to hold home narcotics/CNS depressants   Hypokalemia -To be managed by dialysis/nephrology  Acute metabolic encephalopathy, improving Cannot rule out polypharmacy -Nonspecific findings per  daughter who notes patient's oxycodone dose had recently increased -Sepsis likely also playing a role -Complicated by benzodiazepine administration in the ED -Held CNS depressants at intake - slowly reintroduce home meds - continue oxy ir 5mg  q8h   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  -Appears to be at baseline, continue to follow clinically -Patient previously on iron, med rec no longer contains this medication, defer to outpatient workup for chronic anemia treatment given stable hemoglobin  Pressure injury, POA -See nursing documentation for ongoing staging and location, stage I right sacral area  DVT prophylaxis: heparin injection 5,000 Units Start: 01/29/23 0600 SCDs Start: 01/29/23 0423 Code Status:   Code Status: Full Code Family Communication: None present  Status is: Inpatient  Dispo: The patient is from: Home              Anticipated d/c is to: Home              Anticipated d/c date is: 24 to 48 hours              Patient currently not medically stable for discharge given ongoing need for IV antibiotics, patient has not yet returned back to baseline given her altered mental status  Consultants:  Nephrology  Procedures:  None  Antimicrobials:  Azithromycin, ceftriaxone x 5 days  Subjective: No acute issues or events overnight, patient's mental status appears to be approaching baseline she denies any nausea vomiting diarrhea constipation headache fevers chills or chest pain  Objective: Vitals:   01/30/23 1327 01/30/23 1646 01/30/23 2127 01/31/23 0512  BP:  (!) 136/43 (!) 148/48 (!) 141/61  Pulse:  83 90 77  Resp:  18 18 (!) 21  Temp:  98.4 F (36.9 C) 99.7 F (37.6 C) 98.5 F (36.9 C)  TempSrc:  Oral Oral   SpO2:  96% 94% 96%  Weight: 43.5 kg     Height:        Intake/Output Summary (Last 24 hours) at 01/31/2023 0709 Last data filed at 01/31/2023 0500 Gross per 24 hour  Intake 480 ml  Output 1600 ml  Net -1120 ml   Filed  Weights   01/29/23 0500 01/30/23 1002 01/30/23 1327  Weight: 44.5 kg 45 kg 43.5 kg    Examination:  General:  Pleasantly resting in bed, No acute distress. HEENT:  Normocephalic atraumatic.  Sclerae nonicteric, noninjected.  Extraocular movements intact bilaterally. Neck:  Without mass or deformity.  Trachea is midline. Lungs:  Clear to auscultate bilaterally without rhonchi, wheeze, or rales. Heart:  Regular rate and rhythm.  Without murmurs, rubs, or gallops. Abdomen:  Soft, nontender, nondistended.  Without guarding or rebound. Extremities: Without cyanosis, clubbing, edema, or obvious deformity. Skin:  Warm and dry, no erythema.  Data Reviewed: I have personally reviewed following labs and imaging studies  CBC: Recent Labs  Lab 01/29/23 0032 01/29/23 0444 01/30/23 0407  WBC 11.0* 8.9 8.5  NEUTROABS 9.4* 7.3  --   HGB 8.2* 7.1* 7.4*  HCT 25.9* 22.4* 24.6*  MCV 93.2 92.6 95.7  PLT 305 279 300   Basic Metabolic Panel: Recent Labs  Lab 01/29/23 0032 01/29/23 0444 01/30/23 0407  NA 136 136 136  K 3.2* 3.0* 4.3  CL 96* 98 98  CO2 28 26 26   GLUCOSE 90 83 113*  BUN 28* 31* 53*  CREATININE 4.13* 4.46* 6.01*  CALCIUM 7.6* 7.4* 7.9*  MG  --  1.9 2.2   GFR: Estimated Creatinine Clearance: 4.9 mL/min (A) (by C-G formula based on SCr of 6.01 mg/dL (H)). Liver Function Tests: Recent Labs  Lab 01/29/23 0032 01/29/23 0444 01/30/23 0407  AST 28 24 35  ALT 18 16 20   ALKPHOS 92 75 85  BILITOT 0.5 0.7 0.4  PROT 6.6 5.6* 6.2*  ALBUMIN 2.4* 2.0* 2.1*   Coagulation Profile: Recent Labs  Lab 01/29/23 0032  INR 1.2   Sepsis Labs: Recent Labs  Lab 01/29/23 0032  LATICACIDVEN 0.9    Recent Results (from the past 240 hour(s))  Culture, blood (Routine x 2)     Status: None (Preliminary result)   Collection Time: 01/29/23 12:32 AM   Specimen: BLOOD RIGHT ARM  Result Value Ref Range Status   Specimen Description BLOOD RIGHT ARM  Final   Special Requests   Final     BOTTLES DRAWN AEROBIC AND ANAEROBIC Blood Culture adequate volume   Culture   Final    NO GROWTH 1 DAY Performed at [facility], [address] [place], [place] [address]    Report Status PENDING  Incomplete  Culture, blood (Routine x 2)     Status: None (Preliminary result)   Collection Time: 01/29/23 12:32 AM   Specimen: BLOOD RIGHT HAND  Result Value Ref Range Status   Specimen Description BLOOD RIGHT HAND  Final   Special Requests   Final    BOTTLES DRAWN AEROBIC AND ANAEROBIC Blood Culture adequate volume   Culture   Final    NO GROWTH 1 DAY Performed at [facility], [address] [place], [place] [address]    Report Status PENDING  Incomplete  SARS Coronavirus 2 by RT PCR (hospital order, performed in [facility] hospital lab) *cepheid single result test* Anterior Nasal Swab     Status: None   Collection Time: 01/29/23  2:28 AM   Specimen: Anterior Nasal Swab  Result Value Ref Range Status   SARS Coronavirus 2 by RT PCR NEGATIVE NEGATIVE Final    Comment: (NOTE) SARS-CoV-2 target nucleic acids are NOT DETECTED.  The SARS-CoV-2 RNA is generally detectable in upper and lower respiratory specimens during the acute phase of infection. The lowest concentration of SARS-CoV-2 viral copies this assay can detect is 250 copies / mL. A negative result does not preclude SARS-CoV-2 infection and should not be used as the sole basis for treatment or other patient management decisions.  A negative result may occur with improper specimen collection / handling, submission of specimen other than nasopharyngeal swab, presence of viral mutation(s) within the areas targeted by this assay, and inadequate number of viral copies (<250 copies / mL). A negative result must be combined with clinical observations, patient history, and epidemiological information.  Fact Sheet for Patients:   RoadLapTop.co.za  Fact Sheet for Healthcare  Providers: http://kim-miller.com/  This test is not yet approved or  cleared by the [place] FDA and has been authorized for detection and/or diagnosis of SARS-CoV-2 by FDA under an Emergency Use Authorization (EUA).  This EUA will remain in effect (meaning this test can be used) for the duration of the COVID-19 declaration under Section 564(b)(1) of the Act, 21 U.S.C. section 360bbb-3(b)(1), unless the authorization is terminated or revoked sooner.  Performed at [facility], [address], [place], [place] [address]    Radiology Studies: No results found.  Scheduled Meds:  carvedilol  12.5 mg Oral BID   Chlorhexidine Gluconate Cloth  6 each Topical Q0600   heparin  5,000 Units Subcutaneous Q8H   hydrALAZINE  50 mg Oral BID   Continuous Infusions:  azithromycin 500 mg (01/30/23 2320)   cefTRIAXone (ROCEPHIN)  IV 2 g (01/30/23 2231)     LOS: 1 day   Time spent:  Azucena Fallen, DO [facility]  If 7PM-7AM, please contact night-coverage [email or web address]  01/31/2023, 7:09 AM

## 2023-02-01 ENCOUNTER — Ambulatory Visit (HOSPITAL_COMMUNITY)
Admission: RE | Admit: 2023-02-01 | Discharge: 2023-02-01 | Disposition: A | Discharge status: Home / Self Care | Payer: Medicare Other | Source: Ambulatory Visit | Attending: Nephrology | Admitting: Nephrology

## 2023-02-01 ENCOUNTER — Encounter (HOSPITAL_COMMUNITY): Payer: Self-pay | Admitting: Internal Medicine

## 2023-02-01 DIAGNOSIS — I120 Hypertensive chronic kidney disease with stage 5 chronic kidney disease or end stage renal disease: Secondary | ICD-10-CM | POA: Insufficient documentation

## 2023-02-01 DIAGNOSIS — D631 Anemia in chronic kidney disease: Secondary | ICD-10-CM | POA: Insufficient documentation

## 2023-02-01 DIAGNOSIS — J189 Pneumonia, unspecified organism: Secondary | ICD-10-CM | POA: Diagnosis not present

## 2023-02-01 DIAGNOSIS — K219 Gastro-esophageal reflux disease without esophagitis: Secondary | ICD-10-CM | POA: Insufficient documentation

## 2023-02-01 DIAGNOSIS — T82858A Stenosis of vascular prosthetic devices, implants and grafts, initial encounter: Secondary | ICD-10-CM | POA: Insufficient documentation

## 2023-02-01 DIAGNOSIS — R531 Weakness: Secondary | ICD-10-CM | POA: Diagnosis not present

## 2023-02-01 DIAGNOSIS — Z992 Dependence on renal dialysis: Secondary | ICD-10-CM | POA: Insufficient documentation

## 2023-02-01 DIAGNOSIS — E785 Hyperlipidemia, unspecified: Secondary | ICD-10-CM | POA: Insufficient documentation

## 2023-02-01 DIAGNOSIS — Y841 Kidney dialysis as the cause of abnormal reaction of the patient, or of later complication, without mention of misadventure at the time of the procedure: Secondary | ICD-10-CM | POA: Insufficient documentation

## 2023-02-01 DIAGNOSIS — N186 End stage renal disease: Secondary | ICD-10-CM | POA: Insufficient documentation

## 2023-02-01 DIAGNOSIS — K2100 Gastro-esophageal reflux disease with esophagitis, without bleeding: Secondary | ICD-10-CM | POA: Diagnosis not present

## 2023-02-01 DIAGNOSIS — Z87898 Personal history of other specified conditions: Secondary | ICD-10-CM

## 2023-02-01 HISTORY — PX: IR THROMBECTOMY AV FISTULA W/THROMBOLYSIS/PTA INC/SHUNT/IMG LEFT: IMG6106

## 2023-02-01 HISTORY — PX: IR US GUIDE VASC ACCESS LEFT: IMG2389

## 2023-02-01 LAB — CBC
HCT: 26.1 % — ABNORMAL LOW (ref 36.0–46.0)
Hemoglobin: 7.9 g/dL — ABNORMAL LOW (ref 12.0–15.0)
MCH: 28.5 pg (ref 26.0–34.0)
MCHC: 30.3 g/dL (ref 30.0–36.0)
MCV: 94.2 fL (ref 80.0–100.0)
Platelets: 308 10*3/uL (ref 150–400)
RBC: 2.77 MIL/uL — ABNORMAL LOW (ref 3.87–5.11)
RDW: 16 % — ABNORMAL HIGH (ref 11.5–15.5)
WBC: 10 10*3/uL (ref 4.0–10.5)
nRBC: 0 % (ref 0.0–0.2)

## 2023-02-01 LAB — RENAL FUNCTION PANEL
Albumin: 2.1 g/dL — ABNORMAL LOW (ref 3.5–5.0)
Anion gap: 13 (ref 5–15)
BUN: 60 mg/dL — ABNORMAL HIGH (ref 8–23)
CO2: 25 mmol/L (ref 22–32)
Calcium: 8.5 mg/dL — ABNORMAL LOW (ref 8.9–10.3)
Chloride: 96 mmol/L — ABNORMAL LOW (ref 98–111)
Creatinine, Ser: 6.05 mg/dL — ABNORMAL HIGH (ref 0.44–1.00)
GFR, Estimated: 6 mL/min — ABNORMAL LOW (ref >60)
Glucose, Bld: 98 mg/dL (ref 70–99)
Phosphorus: 6.2 mg/dL — ABNORMAL HIGH (ref 2.5–4.6)
Potassium: 5.1 mmol/L (ref 3.5–5.1)
Sodium: 134 mmol/L — ABNORMAL LOW (ref 135–145)

## 2023-02-01 LAB — CULTURE, BLOOD (ROUTINE X 2)
Culture: NO GROWTH
Culture: NO GROWTH
Special Requests: ADEQUATE

## 2023-02-01 MED ORDER — FENTANYL CITRATE (PF) 100 MCG/2ML IJ SOLN
INTRAMUSCULAR | Status: AC
  Filled 2023-02-01: qty 2

## 2023-02-01 MED ORDER — LIDOCAINE-PRILOCAINE 2.5-2.5 % EX CREA: 1.0000 | TOPICAL_CREAM | CUTANEOUS | Status: DC | PRN

## 2023-02-01 MED ORDER — LIDOCAINE HCL (PF) 1 % IJ SOLN: 5.0000 mL | INTRAMUSCULAR | Status: DC | PRN

## 2023-02-01 MED ORDER — MIDAZOLAM HCL 2 MG/2ML IJ SOLN
INTRAMUSCULAR | Status: AC
  Filled 2023-02-01: qty 2

## 2023-02-01 MED ORDER — FENTANYL CITRATE (PF) 100 MCG/2ML IJ SOLN
INTRAMUSCULAR | Status: AC | PRN
  Administered 2023-02-01: 25 ug via INTRAVENOUS
  Administered 2023-02-01: 50 ug via INTRAVENOUS

## 2023-02-01 MED ORDER — PENTAFLUOROPROP-TETRAFLUOROETH EX AERO: 1.0000 | INHALATION_SPRAY | CUTANEOUS | Status: DC | PRN

## 2023-02-01 MED ORDER — LIDOCAINE HCL 1 % IJ SOLN
10.0000 mL | Freq: Once | INTRAMUSCULAR | Status: AC
  Administered 2023-02-01: 10 mL via INTRADERMAL

## 2023-02-01 MED ORDER — IOHEXOL 300 MG/ML  SOLN
150.0000 mL | Freq: Once | INTRAMUSCULAR | Status: AC | PRN
  Administered 2023-02-01: 40 mL via INTRAVENOUS

## 2023-02-01 MED ORDER — HEPARIN SODIUM (PORCINE) 1000 UNIT/ML IJ SOLN
INTRAMUSCULAR | Status: AC
  Filled 2023-02-01: qty 10

## 2023-02-01 MED ORDER — HEPARIN SODIUM (PORCINE) 1000 UNIT/ML DIALYSIS: 20.0000 [IU]/kg | INTRAMUSCULAR | Status: DC | PRN

## 2023-02-01 MED ORDER — MIDAZOLAM HCL 2 MG/2ML IJ SOLN
INTRAMUSCULAR | Status: AC | PRN
  Administered 2023-02-01 (×3): .5 mg via INTRAVENOUS

## 2023-02-01 MED ORDER — LIDOCAINE HCL 1 % IJ SOLN
INTRAMUSCULAR | Status: AC
  Filled 2023-02-01: qty 20

## 2023-02-01 NOTE — Procedures (Signed)
   HEMODIALYSIS NURSING NOTE:   Proximal aspect of AVG (ven) was cannulated with ease, 15g needle, and good blood return was noted.   Distal (art) aspect was then cannulated, also without difficulty.  As the second needle was being secured with tape I observed the absence of pulsating blood return in the first/venous needle.  I attempted to adjust it and aspirate it without success.  While looking for another cannulation site even more distally, the pulsating blood return in the second needle stopped as well.    Situation was discussed with Dr. Signe Colt who assessed pt bedside.  A third (16g) needle was attempted, unsuccessfully, with no blood return noted.  All three needles were removed and hemostasis was achieved within 5 minutes, each.    Interestingly, there is still a palpable albeit diminished thrill throughout the graft.  I have cannulated this graft many times before, always with ease.  IR consult was placed by Dr. Signe Colt for access evaluation. Today,  K 5.1,  BUN 60,  Hgb 7.9 (up from 7.4 two days ago).  Will dialyze after access evaluation.   Arman Filter, RN [facility]

## 2023-02-01 NOTE — Consult Note (Signed)
Chief Complaint: Concern for clotted AVG. Team is requesting a fistulogram with possible intervention  Referring Physician(s): Dr. Bea Laura. Signe Colt  Supervising Physician: Richarda Overlie  Patient Status:  [facility] in-patient  History of Present Illness: Felicia Williams is a 82 y.o. female female inpatient. History of HLD, GERD, HTN, anemia, ESRD previously on PD now on HD via a left upper arm AV goretex graft placed on 8.29.23. Presented to the ED at  [facility] on 7.9.24 with back pain, generalized weakness, SHOB. Found to have CAP and acute metabolic encephalopathy (since resolved).  Per nephrology notes from this morning : L AVG with likely partial clot, diminished T/B.  Will need to go to [facility] for likely partial thrombectomy/ PTA.  Thrill and bruit diminished. Team is requesting a fistulogram and possible intervention.  Last full dialysis was Wednesday.  Currently without any significant complaints. Patient alert and laying in bed,calm. Denies any fevers, headache, chest pain, SOB, cough, abdominal pain, nausea, vomiting or bleeding.    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 Karilyn Cota.   GERD (gastroesophageal reflux disease)    Glomerulonephritis    Gout    Mixed hyperlipidemia    Spondylosis     Past Surgical History:  Procedure Laterality Date   APPENDECTOMY     AV FISTULA PLACEMENT Left 03/20/2022   Procedure: INSERTION OF LEFT UPPER ARM ARTERIOVENOUS (AV) GORE-TEX GRAFT;  Surgeon: Larina Earthly, MD;  Location: [facility] ORS;  Service: Vascular;  Laterality: Left;   CATARACT EXTRACTION     COLONOSCOPY N/A 12/28/2015   Procedure: COLONOSCOPY;  Surgeon: Malissa Hippo, MD;  Location: [facility] ENDO SUITE;   Service: Endoscopy;  Laterality: N/A;  815   ESOPHAGOGASTRODUODENOSCOPY N/A 11/16/2020   Procedure: ESOPHAGOGASTRODUODENOSCOPY (EGD);  Surgeon: Malissa Hippo, MD;  Location: [facility] ENDO SUITE;  Service: Endoscopy;  Laterality: N/A;  1:15   EXCHANGE OF A DIALYSIS CATHETER N/A 03/20/2022   Procedure: EXCHANGE OF A DIALYSIS CATHETER;  Surgeon: Larina Earthly, MD;  Location: [facility] ORS;  Service: Vascular;  Laterality: N/A;   INSERTION OF DIALYSIS CATHETER Right 09/30/2021   Procedure: INSERTION OF TUNNELED DIALYSIS CATHETER;  Surgeon: Maeola Harman, MD;  Location: [facility] OR;  Service: Vascular;  Laterality: Right;   IR FLUORO GUIDE CV LINE RIGHT  01/16/2019   IR REMOVAL TUN CV CATH W/O FL  07/01/2019   IR US GUIDE VASC ACCESS RIGHT  01/16/2019   POLYPECTOMY  11/16/2020   Procedure: POLYPECTOMY;  Surgeon: Malissa Hippo, MD;  Location: [facility] ENDO SUITE;  Service: Endoscopy;;  gastric   REMOVAL OF A DIALYSIS CATHETER  09/30/2021   Procedure: REMOVAL OF A PERITONEAL DIALYSIS CATHETER;  Surgeon: Maeola Harman, MD;  Location: [facility] OR;  Service: Vascular;;   REMOVAL OF A DIALYSIS CATHETER N/A 05/22/2022   Procedure: MINOR REMOVAL OF A TUNNELED DIALYSIS CATHETER;  Surgeon: Larina Earthly, MD;  Location: [facility] ORS;  Service: Vascular;  Laterality: N/A;   TUBAL LIGATION      Allergies: Cefepime, Morphine, and Sulfa antibiotics  Medications: Prior to Admission medications   Medication Sig Start Date End Date Taking? Authorizing Provider  acetaminophen (TYLENOL) 325 MG tablet Take 2 tablets (650 mg total) by mouth every 6 (six) hours as needed for mild pain. 09/12/21  Yes Love, Evlyn Kanner, PA-C  carvedilol (COREG) 12.5 MG tablet Take 1 tablet by mouth 2 (two) times daily.   Yes [provider]  cephALEXin (KEFLEX) 500 MG capsule Take 500 mg by mouth every 12 (twelve) hours. 12/20/22  Yes [provider]  famotidine (PEPCID) 40 MG tablet Take 1 tablet (40 mg total) by mouth at  bedtime. Patient taking differently: Take 40 mg by mouth as needed for heartburn or indigestion. 11/28/21  Yes Rehman, Joline Maxcy, MD  hydrALAZINE (APRESOLINE) 50 MG tablet Take 50 mg by mouth 2 (two) times daily. Do not take morning dose on Dialysis days. Monday,Wednesday and Friday   Yes [provider]  loperamide (IMODIUM) 2 MG capsule Take 1 capsule (2 mg total) by mouth as needed for diarrhea or loose stools. 09/12/21  Yes Love, Evlyn Kanner, PA-C  midodrine (PROAMATINE) 10 MG tablet Take 1 tablet (10 mg total) by mouth every Monday, Wednesday, and Friday. 10/26/22  Yes Shah, Pratik D, DO  multivitamin (RENA-VIT) TABS tablet Take 1 tablet by mouth daily. One daily   Yes [provider]  nitroGLYCERIN (NITROSTAT) 0.4 MG SL tablet Place 0.4 mg under the tongue every 5 (five) minutes as needed for chest pain. 12/28/22  Yes [provider]  oxyCODONE (OXY IR/ROXICODONE) 5 MG immediate release tablet Take 5 mg by mouth 3 (three) times daily as needed. 01/08/23  Yes [provider]  traZODone (DESYREL) 100 MG tablet Take 50 mg by mouth at bedtime. 07/07/22  Yes [provider]  camphor-menthol Wynelle Fanny) lotion Apply topically as needed for itching. Patient not taking: Reported on 01/29/2023 09/12/21   Love, Evlyn Kanner, PA-C     Family History  Problem Relation Age of Onset   CAD Father    Heart attack Father    Diabetes Mellitus II Father    Hypertension Father    Lupus Brother     Social History   Socioeconomic History   Marital status: Single    Spouse name: Not on file   Number of children: Not on file   Years of education: Not on file   Highest education level: Not on file  Occupational History   Not on file  Tobacco Use   Smoking status: Never    Passive exposure: Never   Smokeless tobacco: Never  Vaping Use   Vaping status: Never Used  Substance and Sexual Activity   Alcohol use: No   Drug use: No   Sexual activity: Not Currently  Other Topics  Concern   Not on file  Social History Narrative   Not on file   Social Determinants of Health   Financial Resource Strain: Not on file  Food Insecurity: No Food Insecurity (01/29/2023)   Hunger Vital Sign    Worried About Running Out of Food in the Last Year: Never true    Ran Out of Food in the Last Year: Never true  Transportation Needs: No Transportation Needs (01/29/2023)   PRAPARE - Administrator, Civil Service (Medical): No    Lack of Transportation (Non-Medical): No  Physical Activity: Not on file  Stress: Not on file  Social Connections: Not on file    Review of Systems: A 12 point ROS discussed and pertinent positives are indicated in the HPI above.  All other systems are negative.  Vital Signs: BP (!) 163/59 (BP Location: Right Arm)   Pulse 79   Temp 98.2 F (36.8 C) (Oral)   Resp (!) 29   Ht 4\' 11"  (1.499 m)   Wt 95 lb 14.4 oz (43.5 kg)   SpO2 100%   BMI 19.37 kg/m   Advance Care Plan: The advanced care plan/surrogate decision maker was discussed at the time of visit and the patient did not wish to discuss or was not able to name a surrogate decision maker or provide an advance care plan.    Physical Exam Vitals reviewed.  Constitutional:      Appearance: Normal appearance.  HENT:     Head: Normocephalic and atraumatic.  Eyes:     Extraocular Movements: Extraocular movements intact.  Cardiovascular:     Rate and Rhythm: Normal rate and regular rhythm.  Pulmonary:     Effort: Pulmonary effort is normal. No respiratory distress.     Breath sounds: Normal breath sounds.  Abdominal:     Palpations: Abdomen is soft.  Musculoskeletal:        General: Normal range of motion.     Cervical back: Normal range of motion.  Skin:    General: Skin is warm and dry.  Neurological:     General: No focal deficit present.     Mental Status: She is alert and oriented to person, place, and time.  Psychiatric:        Mood and Affect: Mood normal.         Behavior: Behavior normal.        Thought Content: Thought content normal.        Judgment: Judgment normal.   Left arm graft with good audible bruit throughout.  Imaging: DG Chest Portable 1 View  Result Date: 01/29/2023 CLINICAL DATA:  Fever, questionable sepsis EXAM: PORTABLE CHEST 1 VIEW COMPARISON:  01/06/2023 FINDINGS: Heart is borderline in size. Aortic atherosclerosis. Diffuse interstitial prominence with bilateral lower lobe airspace opacities and layering effusions. Favor edema although infection is not excluded. Airspace opacities and effusions have worsened since prior study. IMPRESSION: Diffuse interstitial prominence with lower lobe airspace opacities and layering effusions. Favor edema although infection is not excluded. Electronically Signed   By: Charlett Nose M.D.   On: 01/29/2023 00:02   DG Chest Portable 1 View  Result Date: 01/06/2023 CLINICAL DATA:  Short of breath EXAM: PORTABLE CHEST 1 VIEW COMPARISON:  Prior chest x-ray 10/24/2022 FINDINGS: Cardiomegaly with pulmonary vascular congestion extensive bronchitic changes. Patchy foci of increased interstitial airspace opacity present scattered throughout the right and left lung. Probable small bilateral pleural effusions and associated bibasilar atelectasis. Atherosclerotic calcifications present in the transverse aorta. No acute osseous abnormality. IMPRESSION: 1. Cardiomegaly with pulmonary vascular congestion bordering on mild edema. 2. Multifocal patchy airspace opacities bilaterally. Differential considerations include areas of asymmetric pulmonary edema versus multifocal pneumonia including viral pneumonia. 3. Small bilateral pleural effusions and associated bibasilar atelectasis. 4. Aortic atherosclerotic vascular calcifications. Electronically Signed   By: Malachy Moan M.D.   On: 01/06/2023 10:03    Labs:  CBC: Recent Labs    01/29/23 0032 01/29/23 0444 01/30/23 0407 02/01/23 0957  WBC 11.0* 8.9 8.5 10.0  HGB  8.2* 7.1* 7.4* 7.9*  HCT 25.9* 22.4* 24.6* 26.1*  PLT 305 279 300 308    COAGS: Recent Labs    04/22/22 0904 01/29/23 0032  INR 1.1 1.2    BMP: Recent Labs    01/29/23 0032 01/29/23 0444 01/30/23 0407 02/01/23 0957  NA 136 136 136 134*  K 3.2* 3.0* 4.3 5.1  CL 96* 98 98 96*  CO2 28 26 26 25   GLUCOSE 90 83 113* 98  BUN 28* 31* 53* 60*  CALCIUM 7.6* 7.4* 7.9* 8.5*  CREATININE 4.13* 4.46* 6.01* 6.05*  GFRNONAA 10* 9* 7* 6*    LIVER FUNCTION TESTS: Recent Labs    10/15/22 0442 01/29/23 0032 01/29/23 0444 01/30/23 0407 02/01/23 0957  BILITOT 1.0 0.5 0.7 0.4  --   AST 31 28 24  35  --   ALT 19 18 16 20   --   ALKPHOS 72 92 75 85  --   PROT 6.2* 6.6 5.6* 6.2*  --   ALBUMIN 2.9* 2.4* 2.0* 2.1* 2.1*    Assessment and Plan:  82 y.o. female inpatient. History of HLD, GERD, HTN, anemia, ESRD previously on PD now on HD via a left upper arm AV goretex graft placed on 8.29.23. Presented to the ED at  [facility] on 7.9.24 with back pain, generalized weakness, SHOB. Found to have CAP and acute metabolic encephalopathy (since resolved).  Per nephrology notes from this morning : L AVG with likely partial clot, diminished T/B.  Will need to go to [facility] for likely partial thrombectomy/ PTA.  Thrill and bruit diminished. Team is requesting a fistulogram and possible intervention.  No pertinent imaging.  BUN 60, Cr 6.05, Albumin 2.1 GFR< 6, Hgb 7.9. Patient is on subcutaneous prophylactic dose of lovenox.  Allergies include cefepime, Morphine, Sulfa.  Per RN the Patient last ate at 08:30  Risks and benefits discussed with the patient including, but not limited to bleeding, infection, vascular injury, pulmonary embolism, need for tunneled HD catheter placement or even death.  All of the patient's questions were answered, patient is agreeable to proceed. Consent signed and in chart.  Thank you for this interesting consult.  I greatly enjoyed meeting Felicia Williams and look forward  to participating in their care.  A copy of this report was sent to the requesting provider on this date.  Electronically Signed:  Gwynneth Macleod PA-C 02/01/2023 1:49 PM      I spent a total of 20 Minutes  in face to face in clinical consultation, greater than 50% of which was counseling/coordinating care for shuntogram.

## 2023-02-01 NOTE — Progress Notes (Signed)
[facility] Progress Note   Assessment/ Plan:   Dialysis Orders: Center: [name]  on MWF . EDW 43.5kg HD Bath 1K/2.5Ca  Time 3:15 Heparin 1000 unit bolus then 1200 units/hr. Access LAVG BFR 400 DFR 500  Profile 2        Assessment/Plan:  CAP - started on Rocephin and Zithromax per primary svc  ESRD -  Continue with HD on MWF schedule  Hypertension/volume  -  stable  Anemia  - Hgb dropping to 7.1.  would benefit from blood transfusion.  Unclear source.  She denies any hematochezia, melena, or BRBPR.  Continue to follow and will dose with ESA.  Metabolic bone disease -  continue with home meds  Nutrition - renal diet  Acute metabolic encephalopathy - resolved  Prolonged QT interval - per primary.  Currently on telemetry  Hypokalemia - resolved FTT: discussed with pt and dtr.   ACCESS: L AVG with likely partial clot, diminished T/B.  Will need to go to [facility] for likely partial thrombectomy/ PTA.  Have placed IR order.  Primary team notified.  NPO order placed as well.   Dispo: pending  Subjective:    Clotted AVG during cannulation today- initially T/B of AVG was great and unfortunately clotted.  Not able to run today.  Labs pending.  Had breakfast.  Will need to go to [facility].     Objective:   BP (!) 163/59 (BP Location: Right Arm)   Pulse 79   Temp 98.2 F (36.8 C) (Oral)   Resp (!) 29   Ht 4\' 11"  (1.499 m)   Wt 43.5 kg   SpO2 100%   BMI 19.37 kg/m   Physical Exam: Gen:NAD, sitting in bed CVS: RRR Resp: clear Abd: soft Ext: no LE edema ACCESS: AVG, some T/B but diminished, likely partially clotted.    Labs: BMET Recent Labs  Lab 01/29/23 0032 01/29/23 0444 01/30/23 0407 02/01/23 0957  NA 136 136 136 134*  K 3.2* 3.0* 4.3 5.1  CL 96* 98 98 96*  CO2 28 26 26 25   GLUCOSE 90 83 113* 98  BUN 28* 31* 53* 60*  CREATININE 4.13* 4.46* 6.01* 6.05*  CALCIUM 7.6* 7.4* 7.9* 8.5*  PHOS  --   --   --  6.2*   CBC Recent Labs  Lab 01/29/23 0032  01/29/23 0444 01/30/23 0407 02/01/23 0957  WBC 11.0* 8.9 8.5 10.0  NEUTROABS 9.4* 7.3  --   --   HGB 8.2* 7.1* 7.4* 7.9*  HCT 25.9* 22.4* 24.6* 26.1*  MCV 93.2 92.6 95.7 94.2  PLT 305 279 300 308      Medications:     (feeding supplement) PROSource Plus  30 mL Oral BID BM   azithromycin  500 mg Oral QHS   carvedilol  12.5 mg Oral BID   Chlorhexidine Gluconate Cloth  6 each Topical Q0600   heparin  5,000 Units Subcutaneous Q8H   hydrALAZINE  50 mg Oral BID     Bufford Buttner MD 02/01/2023, 10:38 AM

## 2023-02-01 NOTE — Progress Notes (Signed)
PROGRESS NOTE    Tanajia Bramlet  XBJ:[id number] DOB: 05/12/1941 DOA: 01/28/2023 PCP: Rebekah Chesterfield, NP  Brief Narrative:  Felicia Williams is a 82 y.o. female with medical history significant of ESRD, anemia, CHF, depression, hypertension, gastroparesis, GERD, mixed hyperlipidemia, and more presents the ED with a chief complaint of generalized weakness and ambulatory dysfunction.  Imaging in the ED remarkable for pneumonia.  Hospitalist called for admission.    Assessment & Plan:   Principal Problem:   CAP (community acquired pneumonia) Active Problems:   ESRD on dialysis [facility])   Essential hypertension, benign   Acute metabolic encephalopathy   Hypokalemia   Generalized weakness   GERD (gastroesophageal reflux disease)   Prolonged QT interval   Sepsis due to pneumonia (HCC)  Sepsis in setting of community-acquired pneumonia, POA, resolving Acute on chronic respiratory failure, POA -Noted to have fever, leukocytosis, tachycardia with obvious source of infection given abnormal chest x-ray -On 2 L nasal cannula at baseline - required 3L initially but currently on 1L at rest - ambulatory hypoxia ongoing but improving, stable for discharge home pending oxygen screen -Continue ceftriaxone, azithromycin x 5 days, cultures pending (negative thus far) -No indication at this time for steroids, follow closely, if respiratory status does not improve will initiate 24 hours   ESRD on dialysis [facility]) -Nephrology following, dialysis per their expertise -Patient unfortunately lost dialysis access this am while attempting to cannulate for her routine HD. As such will need transfer to main campus for thrombectomy/PTA (IR contacted), NPO.   Essential hypertension, benign -Well-controlled on carvedilol, hydralazine   Prolonged QT interval, resolved -QTc 471 7/11 -Complicated by hypokalemia/ESRD   GERD (gastroesophageal reflux disease) - Continue Pepcid   Generalized weakness -Likely  secondary to above infection, follow clinically -Rule out polypharmacy -continue to hold home narcotics/CNS depressants   Hypokalemia -To be managed by dialysis/nephrology  Acute metabolic encephalopathy, resolved Cannot rule out polypharmacy -Patient's oxycodone dose had recently increased (10mg  TID per report) - this has been resumed at a lower dose (5mg  q8h PRN) without complications and resolution of mental status back to baseline. -Sepsis likely also playing a role -Complicated by benzodiazepine administration in the ED -Held CNS depressants at intake - slowly reintroduce home meds as appropriate (See oxy 5mg  as above)   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  -Appears to be at baseline, continue to follow clinically -Patient previously on iron, med rec no longer contains this medication, defer to outpatient workup for chronic anemia treatment given stable hemoglobin  Pressure injury, POA Stage 1 R sacral pressure injury - others per nursing documentation  DVT prophylaxis: heparin injection 5,000 Units Start: 01/29/23 0600 SCDs Start: 01/29/23 0423 Code Status:   Code Status: Full Code Family Communication: None present  Status is: Inpatient  Dispo: The patient is from: Home              Anticipated d/c is to: Home              Anticipated d/c date is: imminent pending resolution of HD access              Patient currently IS medically stable for discharge awaiting HD access evaluation/repair.  Consultants:  Nephrology  Procedures:  None  Antimicrobials:  Azithromycin, ceftriaxone x 5 days  Subjective: No acute issues or events overnight, patient's mental status essentially at baseline.  Discussed discharge home but unfortunately patient had issue with dialysis access and will need transfer to main campus  for resolution prior to discharge home.  Objective: Vitals:   01/31/23 1346 01/31/23 2131 02/01/23 0436 02/01/23 0920  BP: (!) 142/53  (!) 169/58 (!) 150/61 (!) 163/59  Pulse: 72 80 79 79  Resp:  (!) 22 18 (!) 29  Temp: 98.1 F (36.7 C) 98.7 F (37.1 C) 98.3 F (36.8 C) 98.2 F (36.8 C)  TempSrc: Oral   Oral  SpO2:  97% 98% 100%  Weight:      Height:        Intake/Output Summary (Last 24 hours) at 02/01/2023 1257 Last data filed at 02/01/2023 0846 Gross per 24 hour  Intake 820 ml  Output --  Net 820 ml   Filed Weights   01/29/23 0500 01/30/23 1002 01/30/23 1327  Weight: 44.5 kg 45 kg 43.5 kg    Examination:  General:  Pleasantly resting in bed, No acute distress. HEENT:  Normocephalic atraumatic.  Sclerae nonicteric, noninjected.  Extraocular movements intact bilaterally. Neck:  Without mass or deformity.  Trachea is midline. Lungs:  Clear to auscultate bilaterally without rhonchi, wheeze, or rales. Heart:  Regular rate and rhythm.  Without murmurs, rubs, or gallops. Abdomen:  Soft, nontender, nondistended.  Without guarding or rebound. Extremities: Without cyanosis, clubbing, edema, or obvious deformity. Skin:  Warm and dry, no erythema.  Data Reviewed: I have personally reviewed following labs and imaging studies  CBC: Recent Labs  Lab 01/29/23 0032 01/29/23 0444 01/30/23 0407 02/01/23 0957  WBC 11.0* 8.9 8.5 10.0  NEUTROABS 9.4* 7.3  --   --   HGB 8.2* 7.1* 7.4* 7.9*  HCT 25.9* 22.4* 24.6* 26.1*  MCV 93.2 92.6 95.7 94.2  PLT 305 279 300 308   Basic Metabolic Panel: Recent Labs  Lab 01/29/23 0032 01/29/23 0444 01/30/23 0407 02/01/23 0957  NA 136 136 136 134*  K 3.2* 3.0* 4.3 5.1  CL 96* 98 98 96*  CO2 28 26 26 25   GLUCOSE 90 83 113* 98  BUN 28* 31* 53* 60*  CREATININE 4.13* 4.46* 6.01* 6.05*  CALCIUM 7.6* 7.4* 7.9* 8.5*  MG  --  1.9 2.2  --   PHOS  --   --   --  6.2*   GFR: Estimated Creatinine Clearance: 4.9 mL/min (A) (by C-G formula based on SCr of 6.05 mg/dL (H)). Liver Function Tests: Recent Labs  Lab 01/29/23 0032 01/29/23 0444 01/30/23 0407 02/01/23 0957  AST 28  24 35  --   ALT 18 16 20   --   ALKPHOS 92 75 85  --   BILITOT 0.5 0.7 0.4  --   PROT 6.6 5.6* 6.2*  --   ALBUMIN 2.4* 2.0* 2.1* 2.1*   Coagulation Profile: Recent Labs  Lab 01/29/23 0032  INR 1.2   Sepsis Labs: Recent Labs  Lab 01/29/23 0032  LATICACIDVEN 0.9    Recent Results (from the past 240 hour(s))  Culture, blood (Routine x 2)     Status: None (Preliminary result)   Collection Time: 01/29/23 12:32 AM   Specimen: BLOOD RIGHT ARM  Result Value Ref Range Status   Specimen Description BLOOD RIGHT ARM  Final   Special Requests   Final    BOTTLES DRAWN AEROBIC AND ANAEROBIC Blood Culture adequate volume   Culture   Final    NO GROWTH 3 DAYS Performed at [facility], [address], [place], [place] [address]    Report Status PENDING  Incomplete  Culture, blood (Routine x 2)     Status: None (Preliminary result)  Collection Time: 01/29/23 12:32 AM   Specimen: BLOOD RIGHT HAND  Result Value Ref Range Status   Specimen Description BLOOD RIGHT HAND  Final   Special Requests   Final    BOTTLES DRAWN AEROBIC AND ANAEROBIC Blood Culture adequate volume   Culture   Final    NO GROWTH 3 DAYS Performed at [facility], [address] [place], [place] [address]    Report Status PENDING  Incomplete  SARS Coronavirus 2 by RT PCR (hospital order, performed in [facility] hospital lab) *cepheid single result test* Anterior Nasal Swab     Status: None   Collection Time: 01/29/23  2:28 AM   Specimen: Anterior Nasal Swab  Result Value Ref Range Status   SARS Coronavirus 2 by RT PCR NEGATIVE NEGATIVE Final    Comment: (NOTE) SARS-CoV-2 target nucleic acids are NOT DETECTED.  The SARS-CoV-2 RNA is generally detectable in upper and lower respiratory specimens during the acute phase of infection. The lowest concentration of SARS-CoV-2 viral copies this assay can detect is 250 copies / mL. A negative result does not preclude SARS-CoV-2 infection and should not be used as the sole  basis for treatment or other patient management decisions.  A negative result may occur with improper specimen collection / handling, submission of specimen other than nasopharyngeal swab, presence of viral mutation(s) within the areas targeted by this assay, and inadequate number of viral copies (<250 copies / mL). A negative result must be combined with clinical observations, patient history, and epidemiological information.  Fact Sheet for Patients:   RoadLapTop.co.za  Fact Sheet for Healthcare Providers: http://kim-miller.com/  This test is not yet approved or  cleared by the [place] FDA and has been authorized for detection and/or diagnosis of SARS-CoV-2 by FDA under an Emergency Use Authorization (EUA).  This EUA will remain in effect (meaning this test can be used) for the duration of the COVID-19 declaration under Section 564(b)(1) of the Act, 21 U.S.C. section 360bbb-3(b)(1), unless the authorization is terminated or revoked sooner.  Performed at [facility], [address] [place], [place] [address]    Radiology Studies: No results found.  Scheduled Meds:  (feeding supplement) PROSource Plus  30 mL Oral BID BM   azithromycin  500 mg Oral QHS   carvedilol  12.5 mg Oral BID   Chlorhexidine Gluconate Cloth  6 each Topical Q0600   heparin  5,000 Units Subcutaneous Q8H   hydrALAZINE  50 mg Oral BID   Continuous Infusions:  cefTRIAXone (ROCEPHIN)  IV 2 g (01/31/23 2325)     LOS: 2 days   Time spent:  Azucena Fallen, DO [facility]  If 7PM-7AM, please contact night-coverage [email or web address]  02/01/2023, 12:57 PM

## 2023-02-01 NOTE — Care Management Important Message (Signed)
Important Message  Patient Details  Name: Felicia Williams MRN: [id number] Date of Birth: 04-17-1941   Medicare Important Message Given:  Yes     Corey Harold 02/01/2023, 12:21 PM

## 2023-02-01 NOTE — Procedures (Signed)
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  Treated with 6 mm and 7 mm balloons.  Improved flow after angioplasty.  Purse string suture placed.  Complications: None     EBL: Minimal  Plan: Return to [facility].  Can remove left arm suture at next dialysis.  Bud Kaeser R. Lowella Dandy, MD  Pager: [phone]

## 2023-02-01 NOTE — Progress Notes (Signed)
Patient dressing to LUA remains clean and intact. No s/s of bleeding noted. Patient c/o back pain and given Prn oxycodone . Patients daughter at bedside to visit , patient appetite fair , she ate a small amount of dinner and consumed some ginger-ale .

## 2023-02-01 NOTE — Plan of Care (Signed)

## 2023-02-02 ENCOUNTER — Inpatient Hospital Stay (HOSPITAL_COMMUNITY): Payer: Medicare Other

## 2023-02-02 DIAGNOSIS — J181 Lobar pneumonia, unspecified organism: Secondary | ICD-10-CM

## 2023-02-02 DIAGNOSIS — G9341 Metabolic encephalopathy: Secondary | ICD-10-CM | POA: Diagnosis not present

## 2023-02-02 DIAGNOSIS — N186 End stage renal disease: Secondary | ICD-10-CM | POA: Diagnosis not present

## 2023-02-02 DIAGNOSIS — M545 Low back pain, unspecified: Secondary | ICD-10-CM | POA: Diagnosis not present

## 2023-02-02 DIAGNOSIS — J9601 Acute respiratory failure with hypoxia: Secondary | ICD-10-CM | POA: Diagnosis not present

## 2023-02-02 DIAGNOSIS — G8929 Other chronic pain: Secondary | ICD-10-CM | POA: Diagnosis not present

## 2023-02-02 DIAGNOSIS — A419 Sepsis, unspecified organism: Secondary | ICD-10-CM | POA: Diagnosis not present

## 2023-02-02 LAB — RESPIRATORY PANEL BY PCR

## 2023-02-02 LAB — CBC
HCT: 26.2 % — ABNORMAL LOW (ref 36.0–46.0)
Hemoglobin: 8.1 g/dL — ABNORMAL LOW (ref 12.0–15.0)
MCH: 28.2 pg (ref 26.0–34.0)
MCHC: 30.9 g/dL (ref 30.0–36.0)
MCV: 91.3 fL (ref 80.0–100.0)
Platelets: 319 10*3/uL (ref 150–400)
RBC: 2.87 MIL/uL — ABNORMAL LOW (ref 3.87–5.11)
RDW: 16.5 % — ABNORMAL HIGH (ref 11.5–15.5)
WBC: 10.5 10*3/uL (ref 4.0–10.5)
nRBC: 0 % (ref 0.0–0.2)

## 2023-02-02 LAB — BLOOD GAS, ARTERIAL
Acid-Base Excess: 4.6 mmol/L — ABNORMAL HIGH (ref 0.0–2.0)
Bicarbonate: 28.3 mmol/L — ABNORMAL HIGH (ref 20.0–28.0)
Drawn by: 22223
FIO2: 28 %
O2 Saturation: 87.2 %
Patient temperature: 37.8
pCO2 arterial: 39 mmHg (ref 32–48)
pH, Arterial: 7.47 — ABNORMAL HIGH (ref 7.35–7.45)
pO2, Arterial: 59 mmHg — ABNORMAL LOW (ref 83–108)

## 2023-02-02 LAB — PROCALCITONIN: Procalcitonin: 1.26 ng/mL

## 2023-02-02 LAB — VITAMIN B12: Vitamin B-12: 812 pg/mL (ref 180–914)

## 2023-02-02 LAB — CULTURE, BLOOD (ROUTINE X 2)

## 2023-02-02 LAB — MRSA NEXT GEN BY PCR, NASAL: MRSA by PCR Next Gen: NOT DETECTED

## 2023-02-02 LAB — FOLATE: Folate: 14.6 ng/mL (ref >5.9)

## 2023-02-02 LAB — T4, FREE: Free T4: 1.22 ng/dL — ABNORMAL HIGH (ref 0.61–1.12)

## 2023-02-02 LAB — TSH: TSH: 2.719 u[IU]/mL (ref 0.350–4.500)

## 2023-02-02 LAB — AMMONIA: Ammonia: 17 umol/L (ref 9–35)

## 2023-02-02 MED ORDER — PIPERACILLIN-TAZOBACTAM IN DEX 2-0.25 GM/50ML IV SOLN
2.2500 g | Freq: Three times a day (TID) | INTRAVENOUS | Status: DC
  Filled 2023-02-02 (×4): qty 50

## 2023-02-02 MED ORDER — HYDROMORPHONE HCL 1 MG/ML IJ SOLN
0.5000 mg | INTRAMUSCULAR | Status: DC | PRN
  Administered 2023-02-02: 0.5 mg via INTRAVENOUS
  Filled 2023-02-02: qty 0.5

## 2023-02-02 MED ORDER — HYDROCODONE-ACETAMINOPHEN 10-325 MG PO TABS
1.0000 | ORAL_TABLET | Freq: Four times a day (QID) | ORAL | Status: DC | PRN
  Administered 2023-02-03 – 2023-02-05 (×4): 1 via ORAL
  Filled 2023-02-02 (×5): qty 1

## 2023-02-02 MED ORDER — SODIUM CHLORIDE 0.9 % IV SOLN
2.2500 g | Freq: Three times a day (TID) | INTRAVENOUS | Status: DC
  Administered 2023-02-02 – 2023-02-04 (×7): 2.25 g via INTRAVENOUS
  Filled 2023-02-02: qty 2.25
  Filled 2023-02-02 (×9): qty 10

## 2023-02-02 NOTE — Progress Notes (Signed)
Nurse entered room to administer ceftriaxone IVPB to patient. Daughter voiced concern that the antibiotic may be causing the patient to become confused, as cefepime gave her encephalopathy in the past and these drugs are in the same class. Nurse assessed orientation, patient is alert to self, place and situation but disoriented to time, she believes it is 2012. When asked 30 min later she told me the year was 2024 and the month was 2024. She stated that the president is trump. Daughter is at bedside and requested to hold dose of ceftriaxone. She says the last time her mother became confused it was due to the antibiotics she was receiving. Dr. Thomes Dinning nottified.

## 2023-02-02 NOTE — Progress Notes (Signed)
Physical Therapy Treatment Patient Details Name: Felicia Williams MRN: [id number] DOB: 02-22-1941 Today's Date: 02/02/2023   History of Present Illness Felicia Williams is a 82 y.o. female with medical history significant of ESRD, anemia, CHF, depression, hypertension, gastroparesis, GERD, mixed hyperlipidemia, and more presents the ED with a chief complaint of generalized weakness.  Patient is not able to participate in exam with me as she cannot wake up long enough to do it.  She was just given Ativan in the ED.  Daughter is at bedside.  She is a poor historian.  She reports that patient has been out of it, had dyspnea, and head pain.  Patient called a provider and asked if she could double her oxycodone and was told to go ahead and do that per the daughter's report.  Per the daughter, it still not controlling her pain and she moans all day long.  Daughter reports she has had shortness of breath for couple of months.  It got worse about a week ago.  She was put on oxygen recently.  She reports that patient is coughing a lot after meals..  They report that to the knowledge she has not had any fevers at home.  In the ER when she got here she did have a fever of 101.3.  Daughter cannot remember any more complaints that patient has had.    PT Comments  Patient seen for reevaluation due to decline in functional mobility since evaluation. Patient limited for functional mobility as stated below secondary to BLE weakness, fatigue and impaired standing balance. Patient requiring increased assist for all mobility since initially seen. Patient requires assist to pull to seated EOB. She demonstrates good sitting tolerance and sitting balance. Patient requires min assist and RW to power up to standing due to BLE weakness. Patient ambulates in room with very slow cadence and quick fatigue. Patient returned to bed at end of session. Updating d/c recommendation to rehab as patient is experiencing greater difficulty with  mobility compared to evaluation. Patient will benefit from continued physical therapy in hospital and recommended venue below to increase strength, balance, endurance for safe ADLs and gait.      Assistance Recommended at Discharge Intermittent Supervision/Assistance  If plan is discharge home, recommend the following:  Can travel by private vehicle    A little help with walking and/or transfers;A little help with bathing/dressing/bathroom;Assistance with cooking/housework;Help with stairs or ramp for entrance   Yes  Equipment Recommendations  None recommended by PT    Recommendations for Other Services       Precautions / Restrictions Precautions Precautions: Fall Restrictions Weight Bearing Restrictions: No     Mobility  Bed Mobility Overal bed mobility: Needs Assistance Bed Mobility: Supine to Sit, Sit to Supine     Supine to sit: Min assist, HOB elevated Sit to supine: Modified independent (Device/Increase time)   General bed mobility comments: assist to pull to seated EOB    Transfers Overall transfer level: Needs assistance Equipment used: Rolling walker (2 wheels) Transfers: Sit to/from Stand Sit to Stand: Min assist   Step pivot transfers: Min assist, Min guard       General transfer comment: assist to power up to standing due to LE weakness    Ambulation/Gait Ambulation/Gait assistance: Min guard, Min assist Gait Distance (Feet): 10 Feet Assistive device: Rolling walker (2 wheels) Gait Pattern/deviations: Decreased step length - right, Decreased step length - left, Decreased stride length Gait velocity: decreased     General Gait Details: slightly  labored cadence requiring use of RW, cueing for hands on RW grips as patient tends to rest forearms on RW handles, also requires assist for RW management   Stairs             Wheelchair Mobility     Tilt Bed    Modified Rankin (Stroke Patients Only)       Balance Overall balance  assessment: Needs assistance Sitting-balance support: Feet supported, No upper extremity supported Sitting balance-Leahy Scale: Good Sitting balance - Comments: seated at EOB   Standing balance support: During functional activity, No upper extremity supported Standing balance-Leahy Scale: Fair Standing balance comment: fair using RW                            Cognition Arousal/Alertness: Awake/alert Behavior During Therapy: WFL for tasks assessed/performed Overall Cognitive Status: Within Functional Limits for tasks assessed                                          Exercises      General Comments        Pertinent Vitals/Pain Pain Assessment Pain Assessment: No/denies pain    Home Living Family/patient expects to be discharged to:: Private residence Living Arrangements: Alone Available Help at Discharge: Family;Available 24 hours/day Type of Home: House Home Access: Stairs to enter Entrance Stairs-Rails: None Entrance Stairs-Number of Steps: 1   Home Layout: Able to live on main level with bedroom/bathroom;Laundry or work area in [employer] Equipment: Gilmer Mor - single Librarian, academic (2 wheels);Wheelchair - manual      Prior Function            PT Goals (current goals can now be found in the care plan section) Acute Rehab PT Goals Patient Stated Goal: return home with family to assist PT Goal Formulation: With patient Time For Goal Achievement: 02/01/23 Potential to Achieve Goals: Good Progress towards PT goals: Progressing toward goals    Frequency    Min 3X/week      PT Plan      Co-evaluation              AM-PAC PT "6 Clicks" Mobility   Outcome Measure  Help needed turning from your back to your side while in a flat bed without using bedrails?: None Help needed moving from lying on your back to sitting on the side of a flat bed without using bedrails?: A Little Help needed moving to and from a bed to a  chair (including a wheelchair)?: A Little Help needed standing up from a chair using your arms (e.g., wheelchair or bedside chair)?: A Little Help needed to walk in hospital room?: A Little Help needed climbing 3-5 steps with a railing? : A Lot 6 Click Score: 18    End of Session Equipment Utilized During Treatment: Oxygen;Gait belt Activity Tolerance: Patient tolerated treatment well;Patient limited by fatigue Patient left: in bed;with call bell/phone within reach;with nursing/sitter in room Nurse Communication: Mobility status PT Visit Diagnosis: Unsteadiness on feet (R26.81);Other abnormalities of gait and mobility (R26.89);Muscle weakness (generalized) (M62.81)     Time: 4098-1191 PT Time Calculation (min) (ACUTE ONLY): 11 min  Charges:      PT General Charges $$ ACUTE PT VISIT: 1 Visit  11:22 AM, 02/02/23 Wyman Songster PT, DPT Physical Therapist at [facility]

## 2023-02-02 NOTE — Progress Notes (Signed)
   02/02/23 0544  Neurological  Neuro (WDL) X  Orientation Level Oriented to person;Oriented to place;Disoriented to time;Disoriented to situation  Cognition Appropriate at baseline;Poor attention/concentration;Poor safety awareness;Memory impairment  Speech Clear  R Pupil Size (mm) 1  R Pupil Shape Round  R Pupil Reaction Nonreactive  L Pupil Size (mm) 1  L Pupil Shape Round  L Pupil Reaction Nonreactive  Motor Function/Sensation Assessment Grip  R Hand Grip Moderate  L Hand Grip Moderate  RUE Motor Response Purposeful movement  LUE Motor Response Purposeful movement  RLE Motor Response Purposeful movement  LLE Motor Response Purposeful movement  Neuro Symptoms Other (Comment);Forgetful (fidgeting)   Dr. Thomes Dinning made aware of assessment, patient's daughter Elita Quick is at bedside, stating this is the symptoms her mother experienced when she had a previous adverse reaction to cefepime in February of 2023. ABG drawn and resulted, Head CT ordered. Additional new orders in chart. Daughter at bedside and updated on changes.

## 2023-02-02 NOTE — Hospital Course (Addendum)
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 She is on MWF treatments other pertinent history includes hypertension, hyperlipidemia, gastroparesis, anxiety/depression, GERD, gouty arthritis, and anemia presenting from home secondary to generalized weakness and confusion.  Review the medical record shows that the patient was drowsy in the emergency department in part due to Ativan.  Daughter had stated that the patient had been short of breath for at least 2 months, but had worsened over the week prior to admission.  In the ED, the patient was noted to have a fever 101.3 F but she was hemodynamically stable.  Chest x-ray showed interstitial prominence with left lower lobe opacity.  The patient was started on ceftriaxone and azithromycin.  Nephrology was consulted for maintenance dialysis.  She was noted to have some hypoxia and placed on 3 L nasal cannula. It appears that her respiratory status gradually improved.  On 02/01/2023, there was difficulty accessing the patient's left AVG.  There was concern for partial clot.   IR was consulted.  The left arm shuntogram and balloon angioplasty was performed.  She was found to have 50% stenosis at the venous anastomosis and outflow vein.  She was treated with angioplasty with improvement.  In the evening of 02/01/2023, the patient apparently developed confusion.  Review of the record showed that the patient received oxycodone at 1818 on 02/01/2023.  The patient's daughter believes that the patient's confusion was caused by ceftriaxone.  This was in part due to the patient's previous encephalopathy while receiving cefepime 08/2021.  Therefore, ceftriaxone was discontinued.  ABG was reassuring and showed 7.4 7/39/59/28.  CT of the brain was unremarkable for acute abnormalities, but showed mild cerebral and cerebellar atrophy. Her hospitalization has been  prolonged secondary to development/recurrence of her delirium/encephalopathy which was likely related to the patient's sepsis as well as opioids.  Overall, her mental status gradually improved and returned to baseline at the time of discharge.

## 2023-02-02 NOTE — Plan of Care (Signed)
  Problem: Education: Goal: Knowledge of General Education information will improve Description: Including pain rating scale, medication(s)/side effects and non-pharmacologic comfort measures Outcome: Progressing   Problem: Health Behavior/Discharge Planning: Goal: Ability to manage health-related needs will improve Outcome: Progressing   Problem: Clinical Measurements: Goal: Ability to maintain clinical measurements within normal limits will improve Outcome: Progressing Goal: Will remain free from infection Outcome: Progressing Goal: Diagnostic test results will improve Outcome: Progressing Goal: Respiratory complications will improve Outcome: Progressing Goal: Cardiovascular complication will be avoided Outcome: Progressing   Problem: Activity: Goal: Risk for activity intolerance will decrease Outcome: Progressing   Problem: Nutrition: Goal: Adequate nutrition will be maintained Outcome: Progressing   Problem: Coping: Goal: Level of anxiety will decrease Outcome: Progressing   Problem: Elimination: Goal: Will not experience complications related to bowel motility Outcome: Progressing Goal: Will not experience complications related to urinary retention Outcome: Progressing   Problem: Pain Managment: Goal: General experience of comfort will improve Outcome: Progressing   Problem: Safety: Goal: Ability to remain free from injury will improve Outcome: Progressing   Problem: Skin Integrity: Goal: Risk for impaired skin integrity will decrease Outcome: Progressing   Problem: Activity: Goal: Ability to tolerate increased activity will improve Outcome: Progressing   Problem: Respiratory: Goal: Ability to maintain adequate ventilation will improve Outcome: Progressing Goal: Ability to maintain a clear airway will improve Outcome: Progressing   Problem: Clinical Measurements: Goal: Ability to maintain a body temperature in the normal range will improve Outcome:  Progressing

## 2023-02-02 NOTE — Plan of Care (Signed)
  Problem: Education: Goal: Knowledge of General Education information will improve Description: Including pain rating scale, medication(s)/side effects and non-pharmacologic comfort measures Outcome: Not Met (add Reason)   Problem: Health Behavior/Discharge Planning: Goal: Ability to manage health-related needs will improve Outcome: Not Met (add Reason)   Problem: Activity: Goal: Risk for activity intolerance will decrease Outcome: Not Met (add Reason)   Problem: Clinical Measurements: Goal: Ability to maintain clinical measurements within normal limits will improve Outcome: Progressing   Problem: Nutrition: Goal: Adequate nutrition will be maintained Outcome: Not Met (add Reason)

## 2023-02-02 NOTE — Progress Notes (Addendum)
PROGRESS NOTE  Felicia Williams ZOX:[id number] DOB: [birth year] DOA: 01/28/2023 PCP: Rebekah Chesterfield, NP  Brief History:  82 year old female with a history of glomerulonephritis now with end-stage renal disease had been on PD for many years and transition to hemodialysis February 2023. She is on MWF treatments other pertinent history includes hypertension, hyperlipidemia, gastroparesis, anxiety/depression, GERD, gouty arthritis, and anemia presenting from home secondary to generalized weakness and confusion.  Review the medical record shows that the patient was drowsy in the emergency department in part due to Ativan.  Daughter had stated that the patient had been short of breath for at least 2 months, but had worsened over the week prior to admission.  In the ED, the patient was noted to have a fever 101.3 F but she was hemodynamically stable.  Chest x-ray showed interstitial prominence with left lower lobe opacity.  The patient was started on ceftriaxone and azithromycin.  Nephrology was consulted for maintenance dialysis.  She was noted to have some hypoxia and placed on 3 L nasal cannula. It appears that her respiratory status gradually improved.  On 02/01/2023, there was difficulty accessing the patient's left AVG.  There was concern for partial clot.   IR was consulted.  The left arm shuntogram and balloon angioplasty was performed.  She was found to have 50% stenosis at the venous anastomosis and outflow vein.  She was treated with angioplasty with improvement.  In the evening of 02/01/2023, the patient apparently developed confusion.  Review of the record showed that the patient received oxycodone at 1818 on 02/01/2023.  The patient's daughter believes that the patient's confusion was caused by ceftriaxone.  This was in part due to the patient's previous encephalopathy while receiving cefepime 08/2021.  Therefore, ceftriaxone was discontinued.  ABG was reassuring and showed 7.4 7/39/59/28.   CT of the brain was unremarkable for acute abnormalities, but showed mild cerebral and cerebellar atrophy.    Assessment/Plan: Sepsis -Present on admission -Presented with fever, leukocytosis, tachycardia -Secondary to pneumonia  Acute on chronic respiratory failure with hypoxia -Chronically on 2 L at home, 1L at rest -Initially on 3 L -Secondary to pneumonia  Lobar pneumonia -01/28/2023 chest x-ray showed interstitial prominence and left lower lobe opacity -Patient started on ceftriaxone and azithromycin  Acute metabolic encephalopathy -Multifactorial including polypharmacy, opioid and hypnotic medications, sepsis, and hospital delirium -Patient had developed confusion again on the evening of 02/01/2023 -Daughter believed this to be resulting from ceftriaxone due to the patient's prior encephalopathy from cefepime 08/2021 -Review of the record shows the patient was dosed with oxycodone 5 mg at 1818 on 02/01/2023 -CT brain negative for acute findings but showed cerebellar and cerebral atrophy -02/02/2023 ABG 7.4 7/39/59/28 -pt also received conscious sedation for her L-AVG procedure 7/12 -B12 -folate -TSH  ESRD -Appreciate nephrology -Continue maintenance dialysis MWF  Left AVG stenosis/dysfunction -S/p angioplasty by IR 02/01/2023 -02/01/2023 shuntogram showed 50% stenosis at the venous anastomosis  Essential hypertension -Continue carvedilol and hydralazine  Anemia of CKD/iron deficiency -ESA per nephrology  Generalized weakness -due to pneumonia, deconditioning, polypharmacy -repeat PT eval          Family Communication:   left VM for daughter  Consultants:  renal  Code Status:  FULL   DVT Prophylaxis:  [place] Heparin   Procedures: As Listed in Progress Note Above  Antibiotics: Ceftriaxone 7/9>>7/11 Azithro 7/9>>     Total time spent 50 minutes in addition to the 27 minutes spent  by Dr. Thomes Dinning earlier today.  Greater than 50% spent face to face  counseling and coordinating care.    Subjective: Pt is awake and alert.  Complains of back pain.  Denies cp, n/v/d, abd pain, headache, f/c.  Has sob and dry cough.  No hemoptysis  Objective: Vitals:   02/01/23 1600 02/01/23 2015 02/02/23 0023 02/02/23 0500  BP: (!) 175/76 (!) 177/63 (!) 146/58 (!) 158/65  Pulse: 88 100 80 75  Resp: 20 20  20   Temp:  98.5 F (36.9 C) 98.5 F (36.9 C) 98 F (36.7 C)  TempSrc:  Oral Oral Oral  SpO2: 100% 93% 97% 97%  Weight:    46.4 kg  Height:        Intake/Output Summary (Last 24 hours) at 02/02/2023 0806 Last data filed at 02/01/2023 0846 Gross per 24 hour  Intake 240 ml  Output --  Net 240 ml   Weight change:  Exam:  General:  Pt is alert, follows commands appropriately, not in acute distress HEENT: No icterus, No thrush, No neck mass, [place]/AT Cardiovascular: RRR, S1/S2, no rubs, no gallops Respiratory: bibasilar rales, R>L.  No wheeze Abdomen: Soft/+BS, non tender, non distended, no guarding Extremities: No edema, No lymphangitis, No petechiae, No rashes, no synovitis   Data Reviewed: I have personally reviewed following labs and imaging studies Basic Metabolic Panel: Recent Labs  Lab 01/29/23 0032 01/29/23 0444 01/30/23 0407 02/01/23 0957  NA 136 136 136 134*  K 3.2* 3.0* 4.3 5.1  CL 96* 98 98 96*  CO2 28 26 26 25   GLUCOSE 90 83 113* 98  BUN 28* 31* 53* 60*  CREATININE 4.13* 4.46* 6.01* 6.05*  CALCIUM 7.6* 7.4* 7.9* 8.5*  MG  --  1.9 2.2  --   PHOS  --   --   --  6.2*   Liver Function Tests: Recent Labs  Lab 01/29/23 0032 01/29/23 0444 01/30/23 0407 02/01/23 0957  AST 28 24 35  --   ALT 18 16 20   --   ALKPHOS 92 75 85  --   BILITOT 0.5 0.7 0.4  --   PROT 6.6 5.6* 6.2*  --   ALBUMIN 2.4* 2.0* 2.1* 2.1*   No results for input(s): "LIPASE", "AMYLASE" in the last 168 hours. No results for input(s): "AMMONIA" in the last 168 hours. Coagulation Profile: Recent Labs  Lab 01/29/23 0032  INR 1.2   CBC: Recent  Labs  Lab 01/29/23 0032 01/29/23 0444 01/30/23 0407 02/01/23 0957  WBC 11.0* 8.9 8.5 10.0  NEUTROABS 9.4* 7.3  --   --   HGB 8.2* 7.1* 7.4* 7.9*  HCT 25.9* 22.4* 24.6* 26.1*  MCV 93.2 92.6 95.7 94.2  PLT 305 279 300 308   Cardiac Enzymes: No results for input(s): "CKTOTAL", "CKMB", "CKMBINDEX", "TROPONINI" in the last 168 hours. BNP: Invalid input(s): "POCBNP" CBG: No results for input(s): "GLUCAP" in the last 168 hours. HbA1C: No results for input(s): "HGBA1C" in the last 72 hours. Urine analysis:    Component Value Date/Time   COLORURINE COLORLESS (A) 08/29/2021 0827   APPEARANCEUR CLEAR 08/29/2021 0827   LABSPEC 1.005 08/29/2021 0827   PHURINE 8.0 08/29/2021 0827   GLUCOSEU >=500 (A) 08/29/2021 0827   HGBUR NEGATIVE 08/29/2021 0827   BILIRUBINUR NEGATIVE 08/29/2021 0827   KETONESUR NEGATIVE 08/29/2021 0827   PROTEINUR 30 (A) 08/29/2021 0827   UROBILINOGEN 0.2 01/11/2014 1635   NITRITE NEGATIVE 08/29/2021 0827   LEUKOCYTESUR NEGATIVE 08/29/2021 0827   Sepsis Labs: @LABRCNTIP (procalcitonin:4,lacticidven:4) )  Recent Results (from the past 240 hour(s))  Culture, blood (Routine x 2)     Status: None (Preliminary result)   Collection Time: 01/29/23 12:32 AM   Specimen: BLOOD RIGHT ARM  Result Value Ref Range Status   Specimen Description BLOOD RIGHT ARM  Final   Special Requests   Final    BOTTLES DRAWN AEROBIC AND ANAEROBIC Blood Culture adequate volume   Culture   Final    NO GROWTH 4 DAYS Performed at [facility], [address], [place], [place] [address]    Report Status PENDING  Incomplete  Culture, blood (Routine x 2)     Status: None (Preliminary result)   Collection Time: 01/29/23 12:32 AM   Specimen: BLOOD RIGHT HAND  Result Value Ref Range Status   Specimen Description BLOOD RIGHT HAND  Final   Special Requests   Final    BOTTLES DRAWN AEROBIC AND ANAEROBIC Blood Culture adequate volume   Culture   Final    NO GROWTH 4 DAYS Performed at [facility], [address] [place], [place] [address]    Report Status PENDING  Incomplete  SARS Coronavirus 2 by RT PCR (hospital order, performed in [facility] hospital lab) *cepheid single result test* Anterior Nasal Swab     Status: None   Collection Time: 01/29/23  2:28 AM   Specimen: Anterior Nasal Swab  Result Value Ref Range Status   SARS Coronavirus 2 by RT PCR NEGATIVE NEGATIVE Final    Comment: (NOTE) SARS-CoV-2 target nucleic acids are NOT DETECTED.  The SARS-CoV-2 RNA is generally detectable in upper and lower respiratory specimens during the acute phase of infection. The lowest concentration of SARS-CoV-2 viral copies this assay can detect is 250 copies / mL. A negative result does not preclude SARS-CoV-2 infection and should not be used as the sole basis for treatment or other patient management decisions.  A negative result may occur with improper specimen collection / handling, submission of specimen other than nasopharyngeal swab, presence of viral mutation(s) within the areas targeted by this assay, and inadequate number of viral copies (<250 copies / mL). A negative result must be combined with clinical observations, patient history, and epidemiological information.  Fact Sheet for Patients:   RoadLapTop.co.za  Fact Sheet for Healthcare Providers: http://kim-miller.com/  This test is not yet approved or  cleared by the [place] FDA and has been authorized for detection and/or diagnosis of SARS-CoV-2 by FDA under an Emergency Use Authorization (EUA).  This EUA will remain in effect (meaning this test can be used) for the duration of the COVID-19 declaration under Section 564(b)(1) of the Act, 21 U.S.C. section 360bbb-3(b)(1), unless the authorization is terminated or revoked sooner.  Performed at [facility], [address], [place], [place] [address]      Scheduled Meds:  (feeding supplement) PROSource Plus  30 mL  Oral BID BM   carvedilol  12.5 mg Oral BID   Chlorhexidine Gluconate Cloth  6 each Topical Q0600   heparin  5,000 Units Subcutaneous Q8H   hydrALAZINE  50 mg Oral BID   Continuous Infusions:  cefTRIAXone (ROCEPHIN)  IV Stopped (02/01/23 2344)    Procedures/Studies: CT HEAD WO CONTRAST ( )  Result Date: 02/02/2023 CLINICAL DATA:  82 year old female with history of altered mental status. EXAM: CT HEAD WITHOUT CONTRAST TECHNIQUE: Contiguous axial images were obtained from the base of the skull through the vertex without intravenous contrast. RADIATION DOSE REDUCTION: This exam was performed according to the departmental dose-optimization  program which includes automated exposure control, adjustment of the mA and/or kV according to patient size and/or use of iterative reconstruction technique. COMPARISON:  Head CT 08/31/2021. FINDINGS: Brain: Mild cerebral and cerebellar atrophy. Patchy and confluent areas of decreased attenuation are noted throughout the deep and periventricular white matter of the cerebral hemispheres bilaterally, compatible with chronic microvascular ischemic disease. No evidence of acute infarction, hemorrhage, hydrocephalus, extra-axial collection or mass lesion/mass effect. Vascular: No hyperdense vessel or unexpected calcification. Skull: Normal. Negative for fracture or focal lesion. Sinuses/Orbits: No acute finding. Other: None. IMPRESSION: 1. No acute intracranial abnormalities. 2. Mild cerebral and cerebellar atrophy with mild chronic microvascular ischemic changes in the cerebral white matter, as above. Electronically Signed   By: Trudie Reed M.D.   On: 02/02/2023 07:10   IR THROMBECTOMY AV FISTULA W/THROMBOLYSIS/PTA INC/SHUNT/IMG LEFT  Result Date: 02/01/2023 INDICATION: 82 year old with concern for partial thrombosis of the left upper arm AV graft. EXAM: 1. Left upper extremity shuntogram 2. Balloon angioplasty of graft/vein 3. Ultrasound guidance for vascular  access MEDICATIONS: Moderate sedation ANESTHESIA/SEDATION: Moderate (conscious) sedation was employed during this procedure. A total of Versed 1.5mg  and fentanyl 75 mcg was administered intravenously at the order of the provider performing the procedure. Total intra-service moderate sedation time: 45 minutes. Patient's level of consciousness and vital signs were monitored continuously by radiology nurse throughout the procedure under the supervision of the provider performing the procedure. FLUOROSCOPY: Radiation Exposure Index (as provided by the fluoroscopic device): 13 mGy Kerma CONTRAST:  40 mL Omnipaque 300 COMPLICATIONS: None immediate. TECHNIQUE: Informed written consent was obtained from the patient after a thorough discussion of the procedural risks, benefits and alternatives. All questions were addressed. Maximal Sterile Barrier Technique was utilized including caps, mask, sterile gowns, sterile gloves, sterile drape, hand hygiene and skin antiseptic. A timeout was performed prior to the initiation of the procedure. Ultrasound demonstrated a patent left upper arm AV graft. Ultrasound image was saved for documentation. Left upper arm was prepped and draped in sterile fashion. Skin was anesthetized using 1% lidocaine. Using ultrasound guidance, a 21 gauge needle was directed into the distal aspect of the graft. Micropuncture dilator set was placed. Shuntogram images were obtained. Stenosis near the venous anastomosis was targeted for treatment. 6 [place] vascular sheath was placed over a Bentson wire and Bentson wire was advanced centrally using a Kumpe catheter. The area of stenosis was treated with a 6 mm x 40 mm Mustang balloon. Follow-up shuntogram images were obtained. The area of stenosis was treated again with a 7 mm x 40 mm Mustang balloon. Follow-up shuntogram images were obtained. Vascular sheath was removed with a pursestring suture. FINDINGS: Left upper arm AV graft was patent but there was a  stenosis near the venous anastomosis measuring approximately 50%. There was retrograde filling of a large vein down the left arm. Central veins were patent. Arterial anastomosis was widely patent but there was slight kink in the graft just beyond the arterial anastomosis. This configuration of the graft near the arterial anastomosis did not appear to be flow limiting by ultrasound. The area of stenosis near the venous anastomosis was treated with balloon angioplasty using 6 mm and 7 mm balloons. Near complete resolution of the venous stenosis following balloon angioplasty. At the end of the procedure, there was decreased filling of the branch vessels near the venous anastomosis. IMPRESSION: 1. Patent left upper arm AV graft. 2. Successful treatment of the stenosis near the venous anastomosis using balloon angioplasty. Electronically Signed  By: Richarda Overlie M.D.   On: 02/01/2023 23:16   DG Chest Portable 1 View  Result Date: 01/29/2023 CLINICAL DATA:  Fever, questionable sepsis EXAM: PORTABLE CHEST 1 VIEW COMPARISON:  01/06/2023 FINDINGS: Heart is borderline in size. Aortic atherosclerosis. Diffuse interstitial prominence with bilateral lower lobe airspace opacities and layering effusions. Favor edema although infection is not excluded. Airspace opacities and effusions have worsened since prior study. IMPRESSION: Diffuse interstitial prominence with lower lobe airspace opacities and layering effusions. Favor edema although infection is not excluded. Electronically Signed   By: Charlett Nose M.D.   On: 01/29/2023 00:02   DG Chest Portable 1 View  Result Date: 01/06/2023 CLINICAL DATA:  Short of breath EXAM: PORTABLE CHEST 1 VIEW COMPARISON:  Prior chest x-ray 10/24/2022 FINDINGS: Cardiomegaly with pulmonary vascular congestion extensive bronchitic changes. Patchy foci of increased interstitial airspace opacity present scattered throughout the right and left lung. Probable small bilateral pleural effusions and  associated bibasilar atelectasis. Atherosclerotic calcifications present in the transverse aorta. No acute osseous abnormality. IMPRESSION: 1. Cardiomegaly with pulmonary vascular congestion bordering on mild edema. 2. Multifocal patchy airspace opacities bilaterally. Differential considerations include areas of asymmetric pulmonary edema versus multifocal pneumonia including viral pneumonia. 3. Small bilateral pleural effusions and associated bibasilar atelectasis. 4. Aortic atherosclerotic vascular calcifications. Electronically Signed   By: Malachy Moan M.D.   On: 01/06/2023 10:03    Catarina Hartshorn, DO  [facility]  If 7PM-7AM, please contact night-coverage [email or web address] Password [email or web address] 02/02/2023, 8:06 AM   LOS: 3 days

## 2023-02-02 NOTE — Progress Notes (Addendum)
Progress Note  RN called due to patient having altered mental status which started earlier in the shift.  At bedside, patient was alert and oriented x 3 (person, place and situation), she was not oriented to time, she thought it was 1926, however, confusion appears to have improved from last night, and/or patient is not yet back to baseline per daughter at bedside. Daughter thought it was due to ceftriaxone (since patient has had similar altered mental status to cefepime in the past), however, patient did not have any ceftriaxone last night.  Last dose of ceftriaxone was 7/11 at 23:25. ABG was done: ABG    Component Value Date/Time   PHART 7.47 (H) 02/02/2023 0607   PCO2ART 39 02/02/2023 0607   PO2ART 59 (L) 02/02/2023 0607   HCO3 28.3 (H) 02/02/2023 0607   TCO2 24 04/22/2022 0910   O2SAT 87.2 02/02/2023 0607      Latest Ref Rng & Units 02/01/2023    9:57 AM 01/30/2023    4:07 AM 01/29/2023    4:44 AM  BMP  Glucose 70 - 99 mg/dL 98  540  83   BUN 8 - 23 mg/dL 60  53  31   Creatinine 0.44 - 1.00 mg/dL 9.81  1.91  4.78   Sodium 135 - 145 mmol/L 134  136  136   Potassium 3.5 - 5.1 mmol/L 5.1  4.3  3.0   Chloride 98 - 111 mmol/L 96  98  98   CO2 22 - 32 mmol/L 25  26  26    Calcium 8.9 - 10.3 mg/dL 8.5  7.9  7.4    Chart reviewed showed gradual worsening of BUN, patient was supposed to have dialysis yesterday, but had to go to [facility] to fix her A-V fistula with plan to have dialysis this morning.   Physical Exam  BP (!) 158/65 (BP Location: Right Arm)   Pulse 75   Temp 98 F (36.7 C) (Oral)   Resp 20   Ht 4\' 11"  (1.499 m)   Wt 46.4 kg   SpO2 97%   BMI 20.66 kg/m    Gen:- Awake and Alert  HEENT:- [place].AT, No sclera icterus Neck-Supple Neck,No JVD,.  Lungs-  CTAB. CV- S1, S2 normal Abd-  +ve B.Sounds, Abd Soft, No tenderness,    Extremity/Skin:- No  edema,    Psych-affect is appropriate, oriented x3 Neuro-no new focal deficits, no tremors   Assessment and plan Acute  metabolic encephalopathy possibly secondary to multifactorial including uremic encephalopathy Patient will have dialysis today Daughter presumed that patient's symptoms may be due to ceftriaxone (since patient has had similar reaction in the past to cefepime).  Ceftriaxone was held last night.  Consider change of antibiotics. CT of head without contrast showed no acute intracranial abnormalities Continue to monitor BMP daily  Acute respiratory failure with hypoxia Continue supplemental oxygen to maintain O2 sat > 92%  Chronic back pain Patient was started on IV Dilaudid 0.5 mg every 4 hours as needed Daughter does not want patient to continue with oxycodone, this was stopped  Total time:  27 minutes This includes time reviewing the chart including progress notes, labs, EKGs, taking medical decisions, ordering labs and documenting findings.   Please refer to admission H&P and progress note for details regarding the care of this patient

## 2023-02-02 NOTE — Progress Notes (Signed)
   HEMODIALYSIS TREATMENT NOTE:   AVG patent, PTA suture removed.  Qb was lowered during the last hour for rising venous pressures, however.    3.25 hour heparin-free treatment completed.  UF was precluded by soft BPs -- she took Coreg and Hydralazine prior to HD.  Net UF 0.  All blood was returned and hemostasis was achieved in 15 minutes.  Post-HD thrill is pulsatile but bruit is strongly audible.    02/02/23 1520  Vitals  Temp 98 F (36.7 C)  Temp Source Oral  BP (!) 137/47  MAP (mmHg) 75  BP Location Right Arm  BP Method Automatic  Patient Position (if appropriate) Lying  Pulse Rate 68  Pulse Rate Source Monitor  Resp 16  Oxygen Therapy  SpO2 96 %  O2 Device Nasal Cannula  O2 Flow Rate (L/min) 2 L/min  Post Treatment  Dialyzer Clearance Lightly streaked  Duration of HD Treatment -hour(s) 3.25 hour(s)  Hemodialysis Intake (mL) 0 mL  Liters Processed 65.5  Fluid Removed (mL) 0 mL  Tolerated HD Treatment No (Comment)  Post-Hemodialysis Comments UF limited by hypotension  AVG/AVF Arterial Site Held (minutes) 8 minutes  AVG/AVF Venous Site Held (minutes) 8 minutes  Fistula / Graft Left Upper arm Arteriovenous vein graft  No placement date or time found.   Placed prior to admission: Yes  Orientation: Left  Access Location: Upper arm  Access Type: Arteriovenous vein graft  Fistula / Graft Assessment Bruit (thrill feels pulsatile)  Status Patent    Transported back to 305.  Hand-off given to [employer], LPN  Arman Filter, RN AP K[facility]

## 2023-02-03 DIAGNOSIS — A419 Sepsis, unspecified organism: Secondary | ICD-10-CM | POA: Diagnosis not present

## 2023-02-03 DIAGNOSIS — N186 End stage renal disease: Secondary | ICD-10-CM | POA: Diagnosis not present

## 2023-02-03 DIAGNOSIS — Z992 Dependence on renal dialysis: Secondary | ICD-10-CM | POA: Diagnosis not present

## 2023-02-03 DIAGNOSIS — J181 Lobar pneumonia, unspecified organism: Secondary | ICD-10-CM | POA: Diagnosis not present

## 2023-02-03 LAB — CULTURE, BLOOD (ROUTINE X 2): Special Requests: ADEQUATE

## 2023-02-03 MED ORDER — MELATONIN 3 MG PO TABS
6.0000 mg | ORAL_TABLET | Freq: Once | ORAL | Status: AC
  Administered 2023-02-03: 6 mg via ORAL
  Filled 2023-02-03: qty 2

## 2023-02-03 NOTE — [place] FL2 (Signed)
[place] MEDICAID FL2 LEVEL OF CARE FORM     IDENTIFICATION  Patient Name: Felicia Williams Birthdate: May 24, 1941 Sex: female Admission Date (Current Location): 01/28/2023  [facility] and [place] Number:  [employer] and Address:  [facility],  [address] [place] [address]      Provider Number: [id number]  Attending Physician Name and Address:  Catarina Hartshorn, MD  Relative Name and Phone Number:  Dorie Rank (Daughter)  [phone] (Mobile)    Current Level of Care: Hospital Recommended Level of Care: Skilled Nursing Facility Prior Approval Number:    Date Approved/Denied:   PASRR Number: [id number]  Discharge Plan: SNF    Current Diagnoses: Patient Active Problem List   Diagnosis Date Noted   Lobar pneumonia (HCC) 02/02/2023   Sepsis due to undetermined organism (HCC) 01/30/2023   CAP (community acquired pneumonia) 01/29/2023   Prolonged QT interval 01/29/2023   (HFpEF) heart failure with preserved ejection fraction (HCC) 01/08/2023   Acute hypoxic respiratory failure (HCC) 10/25/2022   Acute hypoxemic respiratory failure (HCC) 10/14/2022   Nausea and vomiting 10/14/2022   Abnormal stress test 08/16/2022   Chronic diarrhea 08/09/2022   GERD (gastroesophageal reflux disease) 07/12/2022   Hypertensive crisis 07/12/2022   Acute respiratory failure with hypoxia (HCC) 04/25/2022   Acute pulmonary edema (HCC)    Pressure injury of skin 04/23/2022   Volume overload 04/22/2022   Elevated troponin 04/22/2022   Hyperkalemia 02/28/2022   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   Anorexia 11/28/2021   Weight loss 11/28/2021   Toxic encephalopathy 09/11/2021   Malnutrition of moderate degree 09/10/2021   Generalized weakness 09/07/2021   Debility 09/07/2021   Change in bowel movement 09/06/2021   Acute metabolic encephalopathy 08/30/2021   Hypokalemia 08/30/2021   DOE (dyspnea on exertion) 08/29/2021   Pruritus 08/28/2021   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    Age-related osteoporosis without current pathological fracture 08/26/2021   Body mass index (BMI) 22.0-22.9, adult 08/26/2021   Cramp in limb 08/26/2021   Epistaxis 08/26/2021   Iron deficiency anemia 08/26/2021   Localized, 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   Overactive bladder 08/26/2021   Sciatica 08/26/2021   Vitamin D deficiency 08/26/2021   Panic disorder 08/26/2021   Sepsis (HCC) 08/26/2021   Gastroparesis 05/23/2021   Chronic cough 05/23/2021   Renal failure 01/15/2019   ARF (acute renal failure) (HCC) 01/07/2019   Glomerulonephritis 01/07/2019   Psoriasis 01/07/2019   Tachycardia, unspecified 01/07/2019   Macrocytic anemia 08/18/2018   Proteinuria 09/04/2017   Essential hypertension, benign 01/26/2014   Palpitations 01/26/2014   Fatigue due to depression 01/26/2014   Gout 07/30/2012   Mixed hyperlipidemia 10/01/2011   Depression 10/01/2011   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    Orientation RESPIRATION BLADDER Height & Weight     Self, Situation, Place  O2 (2L/min Cont) Continent Weight: 102 lb 4.7 oz (46.4 kg) Height:  4\' 11"  (149.9 cm)  BEHAVIORAL SYMPTOMS/MOOD NEUROLOGICAL BOWEL NUTRITION STATUS      Continent Diet  AMBULATORY STATUS COMMUNICATION OF NEEDS Skin   Limited Assist Verbally Normal, Other (Comment) (Echomymosis)                       Personal Care Assistance Level of Assistance  Dressing, Bathing Bathing Assistance: Limited assistance   Dressing Assistance: Limited assistance     Functional Limitations Info  Sight Sight Info: Impaired (Glasses)        SPECIAL CARE FACTORS FREQUENCY  PT (By licensed PT), OT (  By licensed OT)     PT Frequency: 5 X week OT Frequency: 5 X week            Contractures Contractures Info: Not present    Additional Factors Info  Code Status Code Status Info: Full             Current Medications (02/03/2023):  This is the current hospital active medication  list Current Facility-Administered Medications  Medication Dose Route Frequency Provider Last Rate Last Admin   (feeding supplement) PROSource Plus liquid 30 mL  30 mL Oral BID BM Bufford Buttner, MD   30 mL at 02/02/23 0801   acetaminophen (TYLENOL) tablet 650 mg  650 mg Oral Q6H PRN Zierle-Ghosh, Asia B, DO   650 mg at 01/29/23 1835   Or   acetaminophen (TYLENOL) suppository 650 mg  650 mg Rectal Q6H PRN Zierle-Ghosh, Asia B, DO       carvedilol (COREG) tablet 12.5 mg  12.5 mg Oral BID Zierle-Ghosh, Asia B, DO   12.5 mg at 02/03/23 0849   Chlorhexidine Gluconate Cloth 2 % PADS 6 each  6 each Topical Q0600 Terrial Rhodes, MD   6 each at 02/03/23 0540   heparin injection 5,000 Units  5,000 Units Subcutaneous Q8H Zierle-Ghosh, Asia B, DO   5,000 Units at 02/03/23 0537   heparin injection 900 Units  20 Units/kg Dialysis PRN Terrial Rhodes, MD       hydrALAZINE (APRESOLINE) tablet 50 mg  50 mg Oral BID Zierle-Ghosh, Asia B, DO   50 mg at 02/03/23 0849   HYDROcodone-acetaminophen (NORCO) 10-325 MG per tablet 1 tablet  1 tablet Oral Q6H PRN Tat, Onalee Hua, MD   1 tablet at 02/03/23 0850   lidocaine (PF) (XYLOCAINE) 1 % injection 5 mL  5 mL Intradermal PRN Terrial Rhodes, MD       lidocaine-prilocaine (EMLA) cream 1 Application  1 Application Topical PRN Terrial Rhodes, MD       loperamide (IMODIUM) capsule 2 mg  2 mg Oral PRN Zierle-Ghosh, Asia B, DO   2 mg at 01/29/23 2223   ondansetron (ZOFRAN) tablet 4 mg  4 mg Oral Q6H PRN Zierle-Ghosh, Asia B, DO       Or   ondansetron (ZOFRAN) injection 4 mg  4 mg Intravenous Q6H PRN Zierle-Ghosh, Asia B, DO   4 mg at 02/02/23 0801   pentafluoroprop-tetrafluoroeth (GEBAUERS) aerosol 1 Application  1 Application Topical PRN Terrial Rhodes, MD       piperacillin-tazobactam (ZOSYN) 2.25 g in sodium chloride 0.9 % 50 mL IVPB  2.25 g Intravenous Q8H Madueme, Elvira C, RPH 100 mL/hr at 02/03/23 0540 2.25 g at 02/03/23 0540     Discharge  Medications: Please see discharge summary for a list of discharge medications.  Relevant Imaging Results:  Relevant Lab Results:   Additional Information [id number]  Catalina Gravel, LCSW

## 2023-02-03 NOTE — TOC Progression Note (Addendum)
Transition of Care [facility]) - Progression Note    Patient Details  Name: Felicia Williams MRN: [id number] Date of Birth: [birth year]  Transition of Care [facility] CM/SW Contact  Catalina Gravel, [place] Phone Number: 02/03/2023, 12:59 PM  Clinical Narrative:    DR. Shared that daughter/pt now want SNF as recommended by PT.  CSW verified with daughter, and in agreement.  Pt apprehensive, has been at a  rehab in [place].  PASSR complete [id number] A. FL2 will be completed and bed requests sent.  Will need Auth when selected.  TOC to follow.   Addendum: CSW visited pt at bedside, agreed to SNF interested in [facility] or [facility].  Expected Discharge Plan: Home/Self Care Barriers to Discharge: Continued Medical Work up  Expected Discharge Plan and Services In-house Referral: Clinical Social Work Discharge Planning Services: CM Consult   Living arrangements for the past 2 months: Single Family Home                                       Social Determinants of Health (SDOH) Interventions SDOH Screenings   Food Insecurity: No Food Insecurity (01/29/2023)  Housing: Low Risk  (01/29/2023)  Transportation Needs: No Transportation Needs (01/29/2023)  Utilities: Not At Risk (01/29/2023)  Tobacco Use: Low Risk  (01/28/2023)    Readmission Risk Interventions    01/31/2023    9:54 AM 10/25/2022    8:06 AM 10/15/2022   12:28 PM  Readmission Risk Prevention Plan  Transportation Screening Complete Complete Complete  PCP or Specialist Appt within 3-5 Days   Complete  HRI or Home Care Consult Complete Complete Complete  Social Work Consult for Recovery Care Planning/Counseling Complete Complete Complete  Palliative Care Screening Not Applicable Not Applicable Not Applicable  Medication Review Ocean[employer]) Complete Complete Complete

## 2023-02-03 NOTE — Progress Notes (Signed)
PROGRESS NOTE  Felicia Williams ZOX:[id number] DOB: Dec 23, 1940 DOA: 01/28/2023 PCP: Rebekah Chesterfield, NP  Brief History:  82 year old female with a history of glomerulonephritis now with end-stage renal disease had been on PD for many years and transition to hemodialysis February 2023. She is on MWF treatments other pertinent history includes hypertension, hyperlipidemia, gastroparesis, anxiety/depression, GERD, gouty arthritis, and anemia presenting from home secondary to generalized weakness and confusion.  Review the medical record shows that the patient was drowsy in the emergency department in part due to Ativan.  Daughter had stated that the patient had been short of breath for at least 2 months, but had worsened over the week prior to admission.  In the ED, the patient was noted to have a fever 101.3 F but she was hemodynamically stable.  Chest x-ray showed interstitial prominence with left lower lobe opacity.  The patient was started on ceftriaxone and azithromycin.  Nephrology was consulted for maintenance dialysis.  She was noted to have some hypoxia and placed on 3 L nasal cannula. It appears that her respiratory status gradually improved.  On 02/01/2023, there was difficulty accessing the patient's left AVG.  There was concern for partial clot.   IR was consulted.  The left arm shuntogram and balloon angioplasty was performed.  She was found to have 50% stenosis at the venous anastomosis and outflow vein.  She was treated with angioplasty with improvement.  In the evening of 02/01/2023, the patient apparently developed confusion.  Review of the record showed that the patient received oxycodone at 1818 on 02/01/2023.  The patient's daughter believes that the patient's confusion was caused by ceftriaxone.  This was in part due to the patient's previous encephalopathy while receiving cefepime 08/2021.  Therefore, ceftriaxone was discontinued.  ABG was reassuring and showed 7.4 7/39/59/28.   CT of the brain was unremarkable for acute abnormalities, but showed mild cerebral and cerebellar atrophy. Her hospitalization has been prolonged secondary to development/recurrence of her delirium/encephalopathy   Assessment/Plan: Sepsis -Present on admission -Presented with fever, leukocytosis, tachycardia -Secondary to pneumonia   Acute on chronic respiratory failure with hypoxia -Chronically on 2 L at home, 1L at rest -Initially on 3 L -Secondary to pneumonia   Lobar pneumonia -01/28/2023 chest x-ray showed interstitial prominence and left lower lobe opacity -Patient started on ceftriaxone and azithromycin initially -now on zosyn -PCT 1.26 -CT chest--bilateral pleural effusions;  bilateral GGO with interlobular/septal thickening and patchy areas of nodular air space consolidations   Acute metabolic encephalopathy -Multifactorial including polypharmacy, opioid and hypnotic medications, sepsis, and hospital delirium -Patient had developed confusion again on the evening of 02/01/2023 -Daughter believed this to be resulting from ceftriaxone due to the patient's prior encephalopathy from cefepime 08/2021 -Review of the record shows the patient was dosed with oxycodone 5 mg at 1818 on 02/01/2023 -CT brain negative for acute findings but showed cerebellar and cerebral atrophy -02/02/2023 ABG 7.4 7/39/59/28 -pt also received conscious sedation for her L-AVG procedure 7/12 -B12--812 -folate--14.6 -TSH--2.719 -opioid changed to norco 10/325>>tolerating ok with improved mental status   ESRD -Appreciate nephrology -Continue maintenance dialysis MWF -last HD 7/13   Left AVG stenosis/dysfunction -S/p angioplasty by IR 02/01/2023 -02/01/2023 shuntogram showed 50% stenosis at the venous anastomosis   Essential hypertension -Continue carvedilol and hydralazine   Anemia of CKD/iron deficiency -ESA per nephrology   Generalized weakness -due to pneumonia, deconditioning,  polypharmacy -repeat PT eval>>SNF  Family Communication: daughter udpated 7/13   Consultants:  renal   Code Status:  FULL    DVT Prophylaxis:  [place] Heparin     Procedures: As Listed in Progress Note Above   Antibiotics: Ceftriaxone 7/9>>7/11 Azithro 7/9>>            Subjective: Patient denies fevers, chills, headache, chest pain, dyspnea, nausea, vomiting, diarrhea, abdominal pain, dysuria, hematuria, hematochezia, and melena.   Objective: Vitals:   02/02/23 2024 02/03/23 0405 02/03/23 0844 02/03/23 1345  BP:  (!) 145/53 (!) 152/57 (!) 138/58  Pulse:  86 92 79  Resp: 17 18    Temp: 99.3 F (37.4 C) 98.7 F (37.1 C)  98.5 F (36.9 C)  TempSrc: Oral Oral  Oral  SpO2:  95% 96% 99%  Weight:      Height:        Intake/Output Summary (Last 24 hours) at 02/03/2023 1457 Last data filed at 02/03/2023 0900 Gross per 24 hour  Intake 290 ml  Output 0 ml  Net 290 ml   Weight change:  Exam:  General:  Pt is alert, follows commands appropriately, not in acute distress HEENT: No icterus, No thrush, No neck mass, [place]/AT Cardiovascular: RRR, S1/S2, no rubs, no gallops Respiratory: bibasilar crackles. No wheeze Abdomen: Soft/+BS, non tender, non distended, no guarding Extremities: No edema, No lymphangitis, No petechiae, No rashes, no synovitis   Data Reviewed: I have personally reviewed following labs and imaging studies Basic Metabolic Panel: Recent Labs  Lab 01/29/23 0032 01/29/23 0444 01/30/23 0407 02/01/23 0957  NA 136 136 136 134*  K 3.2* 3.0* 4.3 5.1  CL 96* 98 98 96*  CO2 28 26 26 25   GLUCOSE 90 83 113* 98  BUN 28* 31* 53* 60*  CREATININE 4.13* 4.46* 6.01* 6.05*  CALCIUM 7.6* 7.4* 7.9* 8.5*  MG  --  1.9 2.2  --   PHOS  --   --   --  6.2*   Liver Function Tests: Recent Labs  Lab 01/29/23 0032 01/29/23 0444 01/30/23 0407 02/01/23 0957  AST 28 24 35  --   ALT 18 16 20   --   ALKPHOS 92 75 85  --   BILITOT 0.5 0.7  0.4  --   PROT 6.6 5.6* 6.2*  --   ALBUMIN 2.4* 2.0* 2.1* 2.1*   No results for input(s): "LIPASE", "AMYLASE" in the last 168 hours. Recent Labs  Lab 02/02/23 0924  AMMONIA 17   Coagulation Profile: Recent Labs  Lab 01/29/23 0032  INR 1.2   CBC: Recent Labs  Lab 01/29/23 0032 01/29/23 0444 01/30/23 0407 02/01/23 0957 02/02/23 0924  WBC 11.0* 8.9 8.5 10.0 10.5  NEUTROABS 9.4* 7.3  --   --   --   HGB 8.2* 7.1* 7.4* 7.9* 8.1*  HCT 25.9* 22.4* 24.6* 26.1* 26.2*  MCV 93.2 92.6 95.7 94.2 91.3  PLT 305 279 300 308 319   Cardiac Enzymes: No results for input(s): "CKTOTAL", "CKMB", "CKMBINDEX", "TROPONINI" in the last 168 hours. BNP: Invalid input(s): "POCBNP" CBG: No results for input(s): "GLUCAP" in the last 168 hours. HbA1C: No results for input(s): "HGBA1C" in the last 72 hours. Urine analysis:    Component Value Date/Time   COLORURINE COLORLESS (A) 08/29/2021 0827   APPEARANCEUR CLEAR 08/29/2021 0827   LABSPEC 1.005 08/29/2021 0827   PHURINE 8.0 08/29/2021 0827   GLUCOSEU >=500 (A) 08/29/2021 0827   HGBUR NEGATIVE 08/29/2021 0827   BILIRUBINUR NEGATIVE 08/29/2021 0827   KETONESUR NEGATIVE 08/29/2021  0827   PROTEINUR 30 (A) 08/29/2021 0827   UROBILINOGEN 0.2 01/11/2014 1635   NITRITE NEGATIVE 08/29/2021 0827   LEUKOCYTESUR NEGATIVE 08/29/2021 0827   Sepsis Labs: @LABRCNTIP (procalcitonin:4,lacticidven:4) ) Recent Results (from the past 240 hour(s))  Culture, blood (Routine x 2)     Status: None   Collection Time: 01/29/23 12:32 AM   Specimen: BLOOD RIGHT ARM  Result Value Ref Range Status   Specimen Description BLOOD RIGHT ARM  Final   Special Requests   Final    BOTTLES DRAWN AEROBIC AND ANAEROBIC Blood Culture adequate volume   Culture   Final    NO GROWTH 5 DAYS Performed at [facility], [address], [place], [place] [address]    Report Status 02/03/2023 FINAL  Final  Culture, blood (Routine x 2)     Status: None   Collection Time: 01/29/23  12:32 AM   Specimen: BLOOD RIGHT HAND  Result Value Ref Range Status   Specimen Description BLOOD RIGHT HAND  Final   Special Requests   Final    BOTTLES DRAWN AEROBIC AND ANAEROBIC Blood Culture adequate volume   Culture   Final    NO GROWTH 5 DAYS Performed at [facility], [address] [place], [place] [address]    Report Status 02/03/2023 FINAL  Final  SARS Coronavirus 2 by RT PCR (hospital order, performed in [facility] hospital lab) *cepheid single result test* Anterior Nasal Swab     Status: None   Collection Time: 01/29/23  2:28 AM   Specimen: Anterior Nasal Swab  Result Value Ref Range Status   SARS Coronavirus 2 by RT PCR NEGATIVE NEGATIVE Final    Comment: (NOTE) SARS-CoV-2 target nucleic acids are NOT DETECTED.  The SARS-CoV-2 RNA is generally detectable in upper and lower respiratory specimens during the acute phase of infection. The lowest concentration of SARS-CoV-2 viral copies this assay can detect is 250 copies / mL. A negative result does not preclude SARS-CoV-2 infection and should not be used as the sole basis for treatment or other patient management decisions.  A negative result may occur with improper specimen collection / handling, submission of specimen other than nasopharyngeal swab, presence of viral mutation(s) within the areas targeted by this assay, and inadequate number of viral copies (<250 copies / mL). A negative result must be combined with clinical observations, patient history, and epidemiological information.  Fact Sheet for Patients:   RoadLapTop.co.za  Fact Sheet for Healthcare Providers: http://kim-miller.com/  This test is not yet approved or  cleared by the [place] FDA and has been authorized for detection and/or diagnosis of SARS-CoV-2 by FDA under an Emergency Use Authorization (EUA).  This EUA will remain in effect (meaning this test can be used) for the duration of  the COVID-19 declaration under Section 564(b)(1) of the Act, 21 U.S.C. section 360bbb-3(b)(1), unless the authorization is terminated or revoked sooner.  Performed at [facility], [address], [place], [place] [address]   MRSA Next Gen by PCR, Nasal     Status: None   Collection Time: 02/02/23  9:25 AM   Specimen: Nasal Mucosa; Nasal Swab  Result Value Ref Range Status   MRSA by PCR Next Gen NOT DETECTED NOT DETECTED Final    Comment: (NOTE) The GeneXpert MRSA Assay (FDA approved for NASAL specimens only), is one component of a comprehensive MRSA colonization surveillance program. It is not intended to diagnose MRSA infection nor to guide or monitor treatment for MRSA infections. Test performance is not  FDA approved in patients less than 8 years old. Performed at [facility], [address] [place], [place] [address]   Respiratory (~20 pathogens) panel by PCR     Status: None   Collection Time: 02/02/23  9:28 AM   Specimen: Nasopharyngeal Swab; Respiratory  Result Value Ref Range Status   Adenovirus NOT DETECTED NOT DETECTED Final   Coronavirus 229E NOT DETECTED NOT DETECTED Final    Comment: (NOTE) The Coronavirus on the Respiratory Panel, DOES NOT test for the novel  Coronavirus (2019 nCoV)    Coronavirus HKU1 NOT DETECTED NOT DETECTED Final   Coronavirus NL63 NOT DETECTED NOT DETECTED Final   Coronavirus OC43 NOT DETECTED NOT DETECTED Final   Metapneumovirus NOT DETECTED NOT DETECTED Final   Rhinovirus / Enterovirus NOT DETECTED NOT DETECTED Final   Influenza A NOT DETECTED NOT DETECTED Final   Influenza B NOT DETECTED NOT DETECTED Final   Parainfluenza Virus 1 NOT DETECTED NOT DETECTED Final   Parainfluenza Virus 2 NOT DETECTED NOT DETECTED Final   Parainfluenza Virus 3 NOT DETECTED NOT DETECTED Final   Parainfluenza Virus 4 NOT DETECTED NOT DETECTED Final   Respiratory Syncytial Virus NOT DETECTED NOT DETECTED Final   Bordetella pertussis NOT DETECTED NOT DETECTED  Final   Bordetella Parapertussis NOT DETECTED NOT DETECTED Final   Chlamydophila pneumoniae NOT DETECTED NOT DETECTED Final   Mycoplasma pneumoniae NOT DETECTED NOT DETECTED Final    Comment: Performed at [facility], [address], [place], [place] [address]     Scheduled Meds:  (feeding supplement) PROSource Plus  30 mL Oral BID BM   carvedilol  12.5 mg Oral BID   Chlorhexidine Gluconate Cloth  6 each Topical Q0600   heparin  5,000 Units Subcutaneous Q8H   hydrALAZINE  50 mg Oral BID   Continuous Infusions:  piperacillin-tazobactam (ZOSYN)  IV 2.25 g (02/03/23 1330)    Procedures/Studies: CT CHEST WO CONTRAST  Result Date: 02/02/2023 CLINICAL DATA:  82 year old female with history of end-stage renal disease. Possible pneumonia. Abnormal chest x-ray. EXAM: CT CHEST WITHOUT CONTRAST TECHNIQUE: Multidetector CT imaging of the chest was performed following the standard protocol without IV contrast. RADIATION DOSE REDUCTION: This exam was performed according to the departmental dose-optimization program which includes automated exposure control, adjustment of the mA and/or kV according to patient size and/or use of iterative reconstruction technique. COMPARISON:  Chest x-ray 01/28/2023.  Chest CTA 07/11/2022. FINDINGS: Cardiovascular: Heart size is mildly enlarged. There is no significant pericardial fluid, thickening or pericardial calcification. There is aortic atherosclerosis, as well as atherosclerosis of the great vessels of the mediastinum and the coronary arteries, including calcified atherosclerotic plaque in the left main, left anterior descending, left circumflex and right coronary arteries. Calcifications of the aortic valve. Mediastinum/Nodes: No pathologically enlarged mediastinal or hilar lymph nodes. Multiple prominent borderline enlarged mediastinal and bilateral hilar lymph nodes are noted, nonspecific. Esophagus is unremarkable in appearance. No axillary lymphadenopathy.  Lungs/Pleura: Moderate to large bilateral pleural effusions lying dependently with areas of passive atelectasis in the lower lobes of the lungs bilaterally. Aerated portions of the lungs are remarkable for widespread ground-glass attenuation, interlobular septal thickening and patchy areas of what appears to be nodular airspace consolidation, likely reflective of edema. Study is limited by respiratory motion, but no large suspicious appearing pulmonary nodules or masses are confidently identified. Upper Abdomen: Aortic atherosclerosis. Visualized portions of the left kidney demonstrates severe left renal atrophy. Low-attenuation lesion in the upper pole of the left kidney measuring  1.7 cm in diameter, incompletely characterized on today's noncontrast CT examination, but statistically likely a cyst (no imaging follow-up recommended). Musculoskeletal: There are no aggressive appearing lytic or blastic lesions noted in the visualized portions of the skeleton. IMPRESSION: 1. Overall, the appearance of the chest is most suggestive of congestive heart failure with cardiomegaly, pulmonary edema and moderate to large bilateral pleural effusions with extensive passive atelectasis in the dependent portions of the lungs bilaterally. 2. Aortic atherosclerosis, in addition to left main and three-vessel coronary artery disease. 3. There are calcifications of the aortic valve. Echocardiographic correlation for evaluation of potential valvular dysfunction may be warranted if clinically indicated. 4. Additional incidental findings, as above. Aortic Atherosclerosis (ICD10-I70.0). Electronically Signed   By: Trudie Reed M.D.   On: 02/02/2023 13:28   CT HEAD WO CONTRAST ( )  Result Date: 02/02/2023 CLINICAL DATA:  82 year old female with history of altered mental status. EXAM: CT HEAD WITHOUT CONTRAST TECHNIQUE: Contiguous axial images were obtained from the base of the skull through the vertex without intravenous contrast.  RADIATION DOSE REDUCTION: This exam was performed according to the departmental dose-optimization program which includes automated exposure control, adjustment of the mA and/or kV according to patient size and/or use of iterative reconstruction technique. COMPARISON:  Head CT 08/31/2021. FINDINGS: Brain: Mild cerebral and cerebellar atrophy. Patchy and confluent areas of decreased attenuation are noted throughout the deep and periventricular white matter of the cerebral hemispheres bilaterally, compatible with chronic microvascular ischemic disease. No evidence of acute infarction, hemorrhage, hydrocephalus, extra-axial collection or mass lesion/mass effect. Vascular: No hyperdense vessel or unexpected calcification. Skull: Normal. Negative for fracture or focal lesion. Sinuses/Orbits: No acute finding. Other: None. IMPRESSION: 1. No acute intracranial abnormalities. 2. Mild cerebral and cerebellar atrophy with mild chronic microvascular ischemic changes in the cerebral white matter, as above. Electronically Signed   By: Trudie Reed M.D.   On: 02/02/2023 07:10   IR THROMBECTOMY AV FISTULA W/THROMBOLYSIS/PTA INC/SHUNT/IMG LEFT  Result Date: 02/01/2023 INDICATION: 82 year old with concern for partial thrombosis of the left upper arm AV graft. EXAM: 1. Left upper extremity shuntogram 2. Balloon angioplasty of graft/vein 3. Ultrasound guidance for vascular access MEDICATIONS: Moderate sedation ANESTHESIA/SEDATION: Moderate (conscious) sedation was employed during this procedure. A total of Versed 1.5mg  and fentanyl 75 mcg was administered intravenously at the order of the provider performing the procedure. Total intra-service moderate sedation time: 45 minutes. Patient's level of consciousness and vital signs were monitored continuously by radiology nurse throughout the procedure under the supervision of the provider performing the procedure. FLUOROSCOPY: Radiation Exposure Index (as provided by the  fluoroscopic device): 13 mGy Kerma CONTRAST:  40 mL Omnipaque 300 COMPLICATIONS: None immediate. TECHNIQUE: Informed written consent was obtained from the patient after a thorough discussion of the procedural risks, benefits and alternatives. All questions were addressed. Maximal Sterile Barrier Technique was utilized including caps, mask, sterile gowns, sterile gloves, sterile drape, hand hygiene and skin antiseptic. A timeout was performed prior to the initiation of the procedure. Ultrasound demonstrated a patent left upper arm AV graft. Ultrasound image was saved for documentation. Left upper arm was prepped and draped in sterile fashion. Skin was anesthetized using 1% lidocaine. Using ultrasound guidance, a 21 gauge needle was directed into the distal aspect of the graft. Micropuncture dilator set was placed. Shuntogram images were obtained. Stenosis near the venous anastomosis was targeted for treatment. 6 [place] vascular sheath was placed over a Bentson wire and Bentson wire was advanced centrally using a Kumpe catheter. The area  of stenosis was treated with a 6 mm x 40 mm Mustang balloon. Follow-up shuntogram images were obtained. The area of stenosis was treated again with a 7 mm x 40 mm Mustang balloon. Follow-up shuntogram images were obtained. Vascular sheath was removed with a pursestring suture. FINDINGS: Left upper arm AV graft was patent but there was a stenosis near the venous anastomosis measuring approximately 50%. There was retrograde filling of a large vein down the left arm. Central veins were patent. Arterial anastomosis was widely patent but there was slight kink in the graft just beyond the arterial anastomosis. This configuration of the graft near the arterial anastomosis did not appear to be flow limiting by ultrasound. The area of stenosis near the venous anastomosis was treated with balloon angioplasty using 6 mm and 7 mm balloons. Near complete resolution of the venous stenosis following  balloon angioplasty. At the end of the procedure, there was decreased filling of the branch vessels near the venous anastomosis. IMPRESSION: 1. Patent left upper arm AV graft. 2. Successful treatment of the stenosis near the venous anastomosis using balloon angioplasty. Electronically Signed   By: Richarda Overlie M.D.   On: 02/01/2023 23:16   DG Chest Portable 1 View  Result Date: 01/29/2023 CLINICAL DATA:  Fever, questionable sepsis EXAM: PORTABLE CHEST 1 VIEW COMPARISON:  01/06/2023 FINDINGS: Heart is borderline in size. Aortic atherosclerosis. Diffuse interstitial prominence with bilateral lower lobe airspace opacities and layering effusions. Favor edema although infection is not excluded. Airspace opacities and effusions have worsened since prior study. IMPRESSION: Diffuse interstitial prominence with lower lobe airspace opacities and layering effusions. Favor edema although infection is not excluded. Electronically Signed   By: Charlett Nose M.D.   On: 01/29/2023 00:02   DG Chest Portable 1 View  Result Date: 01/06/2023 CLINICAL DATA:  Short of breath EXAM: PORTABLE CHEST 1 VIEW COMPARISON:  Prior chest x-ray 10/24/2022 FINDINGS: Cardiomegaly with pulmonary vascular congestion extensive bronchitic changes. Patchy foci of increased interstitial airspace opacity present scattered throughout the right and left lung. Probable small bilateral pleural effusions and associated bibasilar atelectasis. Atherosclerotic calcifications present in the transverse aorta. No acute osseous abnormality. IMPRESSION: 1. Cardiomegaly with pulmonary vascular congestion bordering on mild edema. 2. Multifocal patchy airspace opacities bilaterally. Differential considerations include areas of asymmetric pulmonary edema versus multifocal pneumonia including viral pneumonia. 3. Small bilateral pleural effusions and associated bibasilar atelectasis. 4. Aortic atherosclerotic vascular calcifications. Electronically Signed   By: Malachy Moan M.D.   On: 01/06/2023 10:03    Catarina Hartshorn, DO  [facility]  If 7PM-7AM, please contact night-coverage [email or web address] Password [email or web address] 02/03/2023, 2:57 PM   LOS: 4 days

## 2023-02-03 NOTE — Plan of Care (Signed)
  Problem: Education: Goal: Knowledge of General Education information will improve Description: Including pain rating scale, medication(s)/side effects and non-pharmacologic comfort measures Outcome: Progressing   Problem: Health Behavior/Discharge Planning: Goal: Ability to manage health-related needs will improve Outcome: Progressing   Problem: Clinical Measurements: Goal: Ability to maintain clinical measurements within normal limits will improve Outcome: Progressing   Problem: Activity: Goal: Risk for activity intolerance will decrease Outcome: Not Met (add Reason)   Problem: Nutrition: Goal: Adequate nutrition will be maintained Outcome: Not Met (add Reason)   Problem: Elimination: Goal: Will not experience complications related to urinary retention Outcome: Not Applicable

## 2023-02-04 DIAGNOSIS — I10 Essential (primary) hypertension: Secondary | ICD-10-CM | POA: Diagnosis not present

## 2023-02-04 DIAGNOSIS — A419 Sepsis, unspecified organism: Secondary | ICD-10-CM | POA: Diagnosis not present

## 2023-02-04 DIAGNOSIS — N186 End stage renal disease: Secondary | ICD-10-CM | POA: Diagnosis not present

## 2023-02-04 DIAGNOSIS — K3184 Gastroparesis: Secondary | ICD-10-CM

## 2023-02-04 DIAGNOSIS — R1114 Bilious vomiting: Secondary | ICD-10-CM | POA: Diagnosis not present

## 2023-02-04 DIAGNOSIS — G9341 Metabolic encephalopathy: Secondary | ICD-10-CM | POA: Diagnosis not present

## 2023-02-04 DIAGNOSIS — R9431 Abnormal electrocardiogram [ECG] [EKG]: Secondary | ICD-10-CM | POA: Diagnosis not present

## 2023-02-04 LAB — RENAL FUNCTION PANEL
Albumin: 2 g/dL — ABNORMAL LOW (ref 3.5–5.0)
Anion gap: 13 (ref 5–15)
BUN: 46 mg/dL — ABNORMAL HIGH (ref 8–23)
CO2: 25 mmol/L (ref 22–32)
Calcium: 8.5 mg/dL — ABNORMAL LOW (ref 8.9–10.3)
Chloride: 95 mmol/L — ABNORMAL LOW (ref 98–111)
Creatinine, Ser: 6.05 mg/dL — ABNORMAL HIGH (ref 0.44–1.00)
GFR, Estimated: 6 mL/min — ABNORMAL LOW (ref >60)
Glucose, Bld: 81 mg/dL (ref 70–99)
Phosphorus: 8.7 mg/dL — ABNORMAL HIGH (ref 2.5–4.6)
Potassium: 5.1 mmol/L (ref 3.5–5.1)
Sodium: 133 mmol/L — ABNORMAL LOW (ref 135–145)

## 2023-02-04 LAB — CBC
HCT: 23.5 % — ABNORMAL LOW (ref 36.0–46.0)
Hemoglobin: 7.3 g/dL — ABNORMAL LOW (ref 12.0–15.0)
MCH: 28.4 pg (ref 26.0–34.0)
MCHC: 31.1 g/dL (ref 30.0–36.0)
MCV: 91.4 fL (ref 80.0–100.0)
Platelets: 282 10*3/uL (ref 150–400)
RBC: 2.57 MIL/uL — ABNORMAL LOW (ref 3.87–5.11)
RDW: 16.1 % — ABNORMAL HIGH (ref 11.5–15.5)
WBC: 7.1 10*3/uL (ref 4.0–10.5)
nRBC: 0 % (ref 0.0–0.2)

## 2023-02-04 MED ORDER — DOXYCYCLINE HYCLATE 100 MG PO TABS
100.0000 mg | ORAL_TABLET | Freq: Two times a day (BID) | ORAL | Status: DC
  Administered 2023-02-04 – 2023-02-05 (×3): 100 mg via ORAL
  Filled 2023-02-04 (×3): qty 1

## 2023-02-04 MED ORDER — PIPERACILLIN-TAZOBACTAM IN DEX 2-0.25 GM/50ML IV SOLN
2.2500 g | Freq: Three times a day (TID) | INTRAVENOUS | Status: AC
  Administered 2023-02-05: 2.25 g via INTRAVENOUS
  Filled 2023-02-04 (×2): qty 50

## 2023-02-04 MED ORDER — PANTOPRAZOLE SODIUM 40 MG PO TBEC
40.0000 mg | DELAYED_RELEASE_TABLET | Freq: Every day | ORAL | Status: DC
  Administered 2023-02-04 – 2023-02-05 (×2): 40 mg via ORAL
  Filled 2023-02-04 (×2): qty 1

## 2023-02-04 MED ORDER — PROCHLORPERAZINE MALEATE 5 MG PO TABS
5.0000 mg | ORAL_TABLET | Freq: Three times a day (TID) | ORAL | Status: DC
  Administered 2023-02-05 (×2): 5 mg via ORAL
  Filled 2023-02-04 (×2): qty 1

## 2023-02-04 MED ORDER — CHLORHEXIDINE GLUCONATE CLOTH 2 % EX PADS
6.0000 | MEDICATED_PAD | Freq: Every day | CUTANEOUS | Status: DC
  Administered 2023-02-04 – 2023-02-05 (×2): 6 via TOPICAL

## 2023-02-04 MED ORDER — DARBEPOETIN ALFA 200 MCG/0.4ML IJ SOSY
200.0000 ug | PREFILLED_SYRINGE | INTRAMUSCULAR | Status: DC
  Filled 2023-02-04: qty 0.4

## 2023-02-04 MED ORDER — DIPHENHYDRAMINE HCL 25 MG PO CAPS: 25.0000 mg | ORAL_CAPSULE | Freq: Four times a day (QID) | ORAL | Status: DC | PRN

## 2023-02-04 MED ORDER — HEPARIN SODIUM (PORCINE) 1000 UNIT/ML DIALYSIS: 20.0000 [IU]/kg | INTRAMUSCULAR | Status: DC | PRN

## 2023-02-04 NOTE — Progress Notes (Signed)
Mobility Specialist Progress Note:   02/04/23 1119  Mobility  Activity Ambulated with assistance in room  Level of Assistance Contact guard assist, steadying assist  Assistive Device Front wheel walker  Distance Ambulated (ft) 24 ft  Range of Motion/Exercises Active;All extremities  Activity Response Tolerated well  Mobility Referral Yes  $Mobility charge 1 Mobility  Mobility Specialist Start Time (ACUTE ONLY) 1100  Mobility Specialist Stop Time (ACUTE ONLY) 1115  Mobility Specialist Time Calculation (min) (ACUTE ONLY) 15 min   Pt agreeable to mobility session, received in bed. Tolerated well, asx throughout. Ambulated with RW and CGA for safety. Returned pt to bed, all needs met, call bell in reach.   Feliciana Rossetti Mobility Specialist Please contact via Special educational needs teacher or  Rehab office at [phone]

## 2023-02-04 NOTE — Progress Notes (Signed)
   HEMODIALYSIS TREATMENT NOTE:  Uneventful 3.25 hour heparin-free treatment completed using left upper arm AVG (16g/antegrade). Goal met: 600 ml removed without interruption in UF.  All blood was returned and hemostasis was achieved in 20 minutes.  Post-HD:  02/04/23 1910  Vitals  Temp 98.2 F (36.8 C)  Temp Source Oral  BP (!) 160/62  MAP (mmHg) 94  BP Location Right Arm  BP Method Automatic  Patient Position (if appropriate) Sitting  Pulse Rate 73  Pulse Rate Source Monitor  Resp 18  Oxygen Therapy  SpO2 100 %  O2 Device Nasal Cannula  O2 Flow Rate (L/min) 3 L/min  Post Treatment  Dialyzer Clearance Lightly streaked  Duration of HD Treatment -hour(s) 3.25 hour(s)  Hemodialysis Intake (mL) 0 mL  Liters Processed 68.3  Fluid Removed (mL) 600 mL  Tolerated HD Treatment Yes  Post-Hemodialysis Comments Goal met  AVG/AVF Arterial Site Held (minutes) 10 minutes  AVG/AVF Venous Site Held (minutes) 10 minutes  Fistula / Graft Left Upper arm Arteriovenous vein graft  No placement date or time found.   Placed prior to admission: Yes  Orientation: Left  Access Location: Upper arm  Access Type: Arteriovenous vein graft  Site Condition No complications  Fistula / Graft Assessment Thrill;Bruit  Status Patent   Pt was transported back to 305 where hand-off was given to Karn Pickler, RN.   Arman Filter, RN [facility]

## 2023-02-04 NOTE — Consult Note (Signed)
@LOGO @   Referring Provider: Dr. Arbutus Leas Primary Care Physician:  Rebekah Chesterfield, NP Primary Gastroenterologist:  Dr. Levon Hedger  Date of Admission: 01/28/23 Date of Consultation: 02/04/23  Reason for Consultation: Recurrent vomiting  HPI:  Felicia Williams is a 82 y.o. year old female with a history of glomerulonephritis now with end-stage renal disease had been on PD for many years and transition to hemodialysis February 2023. She is on MWF treatments other pertinent history includes hypertension, hyperlipidemia, gastroparesis, anxiety/depression, GERD, gouty arthritis, and anemia who presented secondary to generalized weakness and confusion.  She was admitted with sepsis, acute on chronic respiratory failure with hypoxia in the setting of pneumonia, metabolic encephalopathy.  She has been treated with IV antibiotics since admission and respiratory status has improved, remains on Zosyn.  During her admission, she developed trouble with accessing left AVG, found to have 50% stenosis at the venous anastomosis and outflow vein treated with angioplasty on 7/12. Her hospitalization has been prolonged secondary to development/recurrence of her delirium/encephalopathy.  GI consulted today due to recurrent vomiting.  Consult:  Getting ready to go down for dialysis.  Reports yesterday and today, she has had low-volume emesis after eating.  States she feels fine prior to eating, then randomly the vomiting comes on.  Denies abdominal pain, heartburn symptoms, dysphagia, BRBPR, melena, change in bowel habits.  Reports 1 or 2 bowel movements daily.  Feels her symptoms are somewhat similar to prior gastroparesis flares.  She is not been on anything for gastroparesis in a while.  Thinks she is may have taken Reglan in the past and does not remember having any adverse reactions to it.    EGD 11/16/2020: Grade A esophagitis, widely patent Schatzki's ring at GE junction, 5 cm hiatal hernia, few small sessile polyps  in the gastric body biopsied.  Pathology with fundic gland polyps, no H. pylori.   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 Karilyn Cota.   GERD (gastroesophageal reflux disease)    Glomerulonephritis    Gout    Mixed hyperlipidemia    Spondylosis     Past Surgical History:  Procedure Laterality Date   APPENDECTOMY     AV FISTULA PLACEMENT Left 03/20/2022   Procedure: INSERTION OF LEFT UPPER ARM ARTERIOVENOUS (AV) GORE-TEX GRAFT;  Surgeon: Larina Earthly, MD;  Location: [facility];  Service: Vascular;  Laterality: Left;   CATARACT EXTRACTION     COLONOSCOPY N/A 12/28/2015   Procedure: COLONOSCOPY;  Surgeon: Malissa Hippo, MD;  Location: [facility];  Service: Endoscopy;  Laterality: N/A;  815   ESOPHAGOGASTRODUODENOSCOPY N/A 11/16/2020   Procedure: ESOPHAGOGASTRODUODENOSCOPY (EGD);  Surgeon: Malissa Hippo, MD;  Location: [facility];  Service: Endoscopy;  Laterality: N/A;  1:15   EXCHANGE OF A DIALYSIS CATHETER N/A 03/20/2022   Procedure: EXCHANGE OF A DIALYSIS CATHETER;  Surgeon: Larina Earthly, MD;  Location: [facility];  Service: Vascular;  Laterality: N/A;   INSERTION OF DIALYSIS CATHETER Right 09/30/2021   Procedure: INSERTION OF TUNNELED DIALYSIS CATHETER;  Surgeon: Maeola Harman, MD;  Location: [facility] OR;  Service: Vascular;  Laterality: Right;   IR FLUORO GUIDE CV  LINE RIGHT  01/16/2019   IR REMOVAL TUN CV CATH W/O FL  07/01/2019   IR THROMBECTOMY AV FISTULA W/THROMBOLYSIS/PTA INC/SHUNT/IMG LEFT Left 02/01/2023   IR US GUIDE VASC ACCESS RIGHT  01/16/2019   POLYPECTOMY  11/16/2020   Procedure: POLYPECTOMY;  Surgeon: Malissa Hippo, MD;  Location: [facility];  Service: Endoscopy;;  gastric    REMOVAL OF A DIALYSIS CATHETER  09/30/2021   Procedure: REMOVAL OF A PERITONEAL DIALYSIS CATHETER;  Surgeon: Maeola Harman, MD;  Location: [facility] OR;  Service: Vascular;;   REMOVAL OF A DIALYSIS CATHETER N/A 05/22/2022   Procedure: MINOR REMOVAL OF A TUNNELED DIALYSIS CATHETER;  Surgeon: Larina Earthly, MD;  Location: [facility];  Service: Vascular;  Laterality: N/A;   TUBAL LIGATION      Prior to Admission medications   Medication Sig Start Date End Date Taking? Authorizing Provider  acetaminophen (TYLENOL) 325 MG tablet Take 2 tablets (650 mg total) by mouth every 6 (six) hours as needed for mild pain. 09/12/21  Yes Love, Evlyn Kanner, PA-C  carvedilol (COREG) 12.5 MG tablet Take 1 tablet by mouth 2 (two) times daily.   Yes [provider]  cephALEXin (KEFLEX) 500 MG capsule Take 500 mg by mouth every 12 (twelve) hours. 12/20/22  Yes [provider]  famotidine (PEPCID) 40 MG tablet Take 1 tablet (40 mg total) by mouth at bedtime. Patient taking differently: Take 40 mg by mouth as needed for heartburn or indigestion. 11/28/21  Yes Rehman, Joline Maxcy, MD  hydrALAZINE (APRESOLINE) 50 MG tablet Take 50 mg by mouth 2 (two) times daily. Do not take morning dose on Dialysis days. Monday,Wednesday and Friday   Yes [provider]  loperamide (IMODIUM) 2 MG capsule Take 1 capsule (2 mg total) by mouth as needed for diarrhea or loose stools. 09/12/21  Yes Love, Evlyn Kanner, PA-C  midodrine (PROAMATINE) 10 MG tablet Take 1 tablet (10 mg total) by mouth every Monday, Wednesday, and Friday. 10/26/22  Yes Shah, Pratik D, DO  multivitamin (RENA-VIT) TABS tablet Take 1 tablet by mouth daily. One daily   Yes [provider]  nitroGLYCERIN (NITROSTAT) 0.4 MG SL tablet Place 0.4 mg under the tongue every 5 (five) minutes as needed for chest pain. 12/28/22  Yes [provider]  oxyCODONE (OXY IR/ROXICODONE) 5 MG immediate release tablet Take 5 mg by mouth 3 (three) times daily as  needed. 01/08/23  Yes [provider]  traZODone (DESYREL) 100 MG tablet Take 50 mg by mouth at bedtime. 07/07/22  Yes [provider]  camphor-menthol Wynelle Fanny) lotion Apply topically as needed for itching. Patient not taking: Reported on 01/29/2023 09/12/21   Jacquelynn Cree, PA-C    Current Facility-Administered Medications  Medication Dose Route Frequency Provider Last Rate Last Admin   (feeding supplement) PROSource Plus liquid 30 mL  30 mL Oral BID BM Bufford Buttner, MD   30 mL at 02/02/23 0801   acetaminophen (TYLENOL) tablet 650 mg  650 mg Oral Q6H PRN Zierle-Ghosh, Asia B, DO   650 mg at 01/29/23 1835   Or   acetaminophen (TYLENOL) suppository 650 mg  650 mg Rectal Q6H PRN Zierle-Ghosh, Asia B, DO       carvedilol (COREG) tablet 12.5 mg  12.5 mg Oral BID Zierle-Ghosh, Asia B, DO   12.5 mg at 02/04/23 0949   Chlorhexidine Gluconate Cloth 2 % PADS 6 each  6 each Topical Q0600 Terrial Rhodes, MD   6 each at 02/04/23 [phone]  Chlorhexidine Gluconate Cloth 2 % PADS 6 each  6 each Topical Q0600 Annie Sable, MD   6 each at 02/04/23 0949   Darbepoetin Alfa (ARANESP) injection 200 mcg  200 mcg Subcutaneous Q Mon-1800 Annie Sable, MD       diphenhydrAMINE (BENADRYL) capsule 25 mg  25 mg Oral Q6H PRN Annie Sable, MD       doxycycline (VIBRA-TABS) tablet 100 mg  100 mg Oral Pablo Ledger, MD       heparin injection 5,000 Units  5,000 Units Subcutaneous Q8H Zierle-Ghosh, Asia B, DO   5,000 Units at 02/04/23 0540   heparin injection 900 Units  20 Units/kg Dialysis PRN Terrial Rhodes, MD       hydrALAZINE (APRESOLINE) tablet 50 mg  50 mg Oral BID Zierle-Ghosh, Asia B, DO   50 mg at 02/04/23 0949   HYDROcodone-acetaminophen (NORCO) 10-325 MG per tablet 1 tablet  1 tablet Oral Q6H PRN Catarina Hartshorn, MD   1 tablet at 02/04/23 0549   lidocaine (PF) (XYLOCAINE) 1 % injection 5 mL  5 mL Intradermal PRN Terrial Rhodes, MD       lidocaine-prilocaine (EMLA)  cream 1 Application  1 Application Topical PRN Terrial Rhodes, MD       loperamide (IMODIUM) capsule 2 mg  2 mg Oral PRN Zierle-Ghosh, Asia B, DO   2 mg at 02/03/23 1725   ondansetron (ZOFRAN) tablet 4 mg  4 mg Oral Q6H PRN Zierle-Ghosh, Asia B, DO       Or   ondansetron (ZOFRAN) injection 4 mg  4 mg Intravenous Q6H PRN Zierle-Ghosh, Asia B, DO   4 mg at 02/04/23 0806   pentafluoroprop-tetrafluoroeth (GEBAUERS) aerosol 1 Application  1 Application Topical PRN Terrial Rhodes, MD       piperacillin-tazobactam (ZOSYN) 2.25 g in sodium chloride 0.9 % 50 mL IVPB  2.25 g Intravenous Andres Labrum, MD   Stopped at 02/04/23 [phone]    Allergies as of 01/28/2023 - Review Complete 01/28/2023  Allergen Reaction Noted   Cefepime Other (See Comments) 09/01/2021   Morphine Other (See Comments) 09/28/2021   Sulfa antibiotics Other (See Comments) 01/11/2014    Family History  Problem Relation Age of Onset   CAD Father    Heart attack Father    Diabetes Mellitus II Father    Hypertension Father    Lupus Brother     Social History   Socioeconomic History   Marital status: Single    Spouse name: Not on file   Number of children: Not on file   Years of education: Not on file   Highest education level: Not on file  Occupational History   Not on file  Tobacco Use   Smoking status: Never    Passive exposure: Never   Smokeless tobacco: Never  Vaping Use   Vaping status: Never Used  Substance and Sexual Activity   Alcohol use: No   Drug use: No   Sexual activity: Not Currently  Other Topics Concern   Not on file  Social History Narrative   Not on file   Social Determinants of Health   Financial Resource Strain: Not on file  Food Insecurity: No Food Insecurity (01/29/2023)   Hunger Vital Sign    Worried About Running Out of Food in the Last Year: Never true    Ran Out of Food in the Last Year: Never true  Transportation Needs: No Transportation Needs (01/29/2023)   PRAPARE -  Transportation    Lack  of Transportation (Medical): No    Lack of Transportation (Non-Medical): No  Physical Activity: Not on file  Stress: Not on file  Social Connections: Not on file  Intimate Partner Violence: Not At Risk (01/29/2023)   Humiliation, Afraid, Rape, and Kick questionnaire    Fear of Current or Ex-Partner: No    Emotionally Abused: No    Physically Abused: No    Sexually Abused: No    Review of Systems: Gen: Denies fever, chills, loss of appetite, change in weight or weight loss CV: Denies chest pain, heart palpitations, syncope, edema  Resp: Denies shortness of breath with rest, cough, wheezing GI: Denies dysphagia or odynophagia. Denies vomiting blood, jaundice, and fecal incontinence.  GU : Denies urinary burning, urinary frequency, urinary incontinence.  MS: Denies joint pain,swelling, cramping Derm: Denies rash, itching, dry skin Psych: Denies depression, anxiety,confusion, or memory loss Heme: Denies bruising, bleeding, and enlarged lymph nodes.  Physical Exam: Vital signs in last 24 hours: Temp:  [98 F (36.7 C)-98.3 F (36.8 C)] 98.3 F (36.8 C) (07/15 1347) Pulse Rate:  [79-88] 79 (07/15 1347) Resp:  [18-21] 18 (07/15 1347) BP: (153-167)/(64-74) 155/64 (07/15 1347) SpO2:  [98 %-99 %] 99 % (07/15 1347) Last BM Date : 02/02/23 General:   Alert,  Well-developed, well-nourished, pleasant and cooperative in NAD Head:  Normocephalic and atraumatic. Eyes:  Sclera clear, no icterus.   Conjunctiva pink. Ears:  Normal auditory acuity. Nose:  No deformity, discharge,  or lesions. Mouth:  No deformity or lesions, dentition normal. Neck:  Supple; no masses or thyromegaly. Lungs:  Clear throughout to auscultation.   No wheezes, crackles, or rhonchi. No acute distress. Heart:  Regular rate and rhythm; no murmurs, clicks, rubs,  or gallops. Abdomen:  Soft, nontender and nondistended. No masses, hepatosplenomegaly or hernias noted. Normal bowel sounds, without  guarding, and without rebound.   Rectal:  Deferred until time of colonoscopy.   Msk:  Symmetrical without gross deformities. Normal posture. Pulses:  Normal pulses noted. Extremities:  Without clubbing or edema. Neurologic:  Alert and  oriented x4;  grossly normal neurologically. Skin:  Intact without significant lesions or rashes. Cervical Nodes:  No significant cervical adenopathy. Psych:  Alert and cooperative. Normal mood and affect.   Lab Results: Recent Labs    02/02/23 0924  WBC 10.5  HGB 8.1*  HCT 26.2*  PLT 319    Impression: 82 y.o. year old female with a history of glomerulonephritis now with end-stage renal disease had been on PD for many years and transition to hemodialysis February 2023. She is on MWF treatments other pertinent history includes hypertension, hyperlipidemia, gastroparesis, anxiety/depression, GERD, gouty arthritis, and anemia who presented secondary to generalized weakness and confusion.  She was admitted with sepsis, acute on chronic respiratory failure with hypoxia in the setting of pneumonia, metabolic encephalopathy.  She has been treated with IV antibiotics since admission and respiratory status has improved, remains on Zosyn.  During her admission, she developed trouble with accessing left AVG, found to have 50% stenosis at the venous anastomosis and outflow vein treated with angioplasty on 7/12. Her hospitalization has been prolonged secondary to development/recurrence of her delirium/encephalopathy.  GI consulted today due to recurrent vomiting.   N/V:  Postprandial nausea/vomiting for the last 2 days.  Denies any symptoms of nausea prior to meals.  Reports she had been doing well prior to this.  Denies associated abdominal pain, heartburn, or dysphagia.  Denies BRBPR or melena.  Last EGD April 2022 with grade  a esophagitis, widely patent Schatzki's ring at GE junction, 5 cm hiatal hernia, fundic gland polyps.    Overall, she feels symptoms are similar  to prior gastroparesis flare. She has not been on anything for gastroparesis in quite some time.  Previously responded well to short-term metoclopramide and then did well with dietary changes.  I suspect her acute illness very well may have triggered gastroparesis flare.   Recommend starting daily PPI and scheduled compazine. No scheduled zofran due to concerns about QT prolongation. Can consider adding/transitioning to Reglan if needed. If persistent symptoms, can pursue EGD.    Plan: Start pantoprazole 40 mg daily. Start Compazine 5 mg 3 times daily before meals, scheduled. If Compazine is not helpful, can add/replace with Reglan for a short course.   4-6 small meals daily.  Avoid raw fruits and vegetables.  Low fat diet.  Consider EGD if persistent symptoms.    LOS: 5 days    02/04/2023, 2:30 PM   Ermalinda Memos, [facility]

## 2023-02-04 NOTE — TOC Progression Note (Signed)
Transition of Care [facility]) - Progression Note    Patient Details  Name: Felicia Williams MRN: [id number] Date of Birth: Apr 11, 1941  [facility] CM/SW Contact  Karn Cassis, [place] Phone Number: 02/04/2023, 1:04 PM  Clinical Narrative:  Pt's daughter accepts bed at [facility]. SNF notified. CMA started authorization. MD updated.      Expected Discharge Plan: Home/Self Care Barriers to Discharge: Continued Medical Work up  Expected Discharge Plan and Services In-house Referral: Clinical Social Work Discharge Planning Services: CM Consult   Living arrangements for the past 2 months: Single Family Home                                       Social Determinants of Health (SDOH) Interventions SDOH Screenings   Food Insecurity: No Food Insecurity (01/29/2023)  Housing: Low Risk  (01/29/2023)  Transportation Needs: No Transportation Needs (01/29/2023)  Utilities: Not At Risk (01/29/2023)  Tobacco Use: Low Risk  (01/28/2023)    Readmission Risk Interventions    01/31/2023    9:54 AM 10/25/2022    8:06 AM 10/15/2022   12:28 PM  Readmission Risk Prevention Plan  Transportation Screening Complete Complete Complete  PCP or Specialist Appt within 3-5 Days   Complete  HRI or Home Care Consult Complete Complete Complete  Social Work Consult for Recovery Care Planning/Counseling Complete Complete Complete  Palliative Care Screening Not Applicable Not Applicable Not Applicable  Medication Review Oceanographer) Complete Complete Complete

## 2023-02-04 NOTE — Plan of Care (Signed)
  Problem: Education: Goal: Knowledge of General Education information will improve Description: Including pain rating scale, medication(s)/side effects and non-pharmacologic comfort measures Outcome: Progressing   Problem: Health Behavior/Discharge Planning: Goal: Ability to manage health-related needs will improve Outcome: Progressing   Problem: Clinical Measurements: Goal: Ability to maintain clinical measurements within normal limits will improve Outcome: Progressing Goal: Will remain free from infection Outcome: Progressing Goal: Diagnostic test results will improve Outcome: Progressing Goal: Respiratory complications will improve Outcome: Progressing Goal: Cardiovascular complication will be avoided Outcome: Progressing   Problem: Activity: Goal: Risk for activity intolerance will decrease Outcome: Progressing   Problem: Nutrition: Goal: Adequate nutrition will be maintained Outcome: Progressing   Problem: Coping: Goal: Level of anxiety will decrease Outcome: Progressing   Problem: Elimination: Goal: Will not experience complications related to bowel motility Outcome: Progressing   Problem: Pain Managment: Goal: General experience of comfort will improve Outcome: Progressing   Problem: Safety: Goal: Ability to remain free from injury will improve Outcome: Progressing   Problem: Skin Integrity: Goal: Risk for impaired skin integrity will decrease Outcome: Progressing   Problem: Activity: Goal: Ability to tolerate increased activity will improve Outcome: Progressing   Problem: Clinical Measurements: Goal: Ability to maintain a body temperature in the normal range will improve Outcome: Progressing   Problem: Respiratory: Goal: Ability to maintain adequate ventilation will improve Outcome: Progressing Goal: Ability to maintain a clear airway will improve Outcome: Progressing   

## 2023-02-04 NOTE — Progress Notes (Signed)
PROGRESS NOTE  Felicia Williams ZOX:[id number] DOB: 06-16-1941 DOA: 01/28/2023 PCP: Rebekah Chesterfield, NP  Brief History:  82 year old female with a history of glomerulonephritis now with end-stage renal disease had been on PD for many years and transition to hemodialysis February 2023. She is on MWF treatments other pertinent history includes hypertension, hyperlipidemia, gastroparesis, anxiety/depression, GERD, gouty arthritis, and anemia presenting from home secondary to generalized weakness and confusion.  Review the medical record shows that the patient was drowsy in the emergency department in part due to Ativan.  Daughter had stated that the patient had been short of breath for at least 2 months, but had worsened over the week prior to admission.  In the ED, the patient was noted to have a fever 101.3 F but she was hemodynamically stable.  Chest x-ray showed interstitial prominence with left lower lobe opacity.  The patient was started on ceftriaxone and azithromycin.  Nephrology was consulted for maintenance dialysis.  She was noted to have some hypoxia and placed on 3 L nasal cannula. It appears that her respiratory status gradually improved.  On 02/01/2023, there was difficulty accessing the patient's left AVG.  There was concern for partial clot.   IR was consulted.  The left arm shuntogram and balloon angioplasty was performed.  She was found to have 50% stenosis at the venous anastomosis and outflow vein.  She was treated with angioplasty with improvement.  In the evening of 02/01/2023, the patient apparently developed confusion.  Review of the record showed that the patient received oxycodone at 1818 on 02/01/2023.  The patient's daughter believes that the patient's confusion was caused by ceftriaxone.  This was in part due to the patient's previous encephalopathy while receiving cefepime 08/2021.  Therefore, ceftriaxone was discontinued.  ABG was reassuring and showed 7.4 7/39/59/28.   CT of the brain was unremarkable for acute abnormalities, but showed mild cerebral and cerebellar atrophy. Her hospitalization has been prolonged secondary to development/recurrence of her delirium/encephalopathy   Assessment/Plan: Sepsis -Present on admission -Presented with fever, leukocytosis, tachycardia -Secondary to pneumonia -sepsis physiology resolved   Acute on chronic respiratory failure with hypoxia -Chronically on 2 L at home, 1L at rest -Initially on 3 L -Secondary to pneumonia -now back to 2L   Lobar pneumonia -01/28/2023 chest x-ray showed interstitial prominence and left lower lobe opacity -Patient started on ceftriaxone and azithromycin initially -now on zosyn -PCT 1.26 -CT chest--bilateral pleural effusions;  bilateral GGO with interlobular/septal thickening and patchy areas of nodular air space consolidations   Acute metabolic encephalopathy -Multifactorial including polypharmacy, opioid and hypnotic medications, sepsis, and hospital delirium -Patient had developed confusion again on the evening of 02/01/2023 -Daughter believed this to be resulting from ceftriaxone due to the patient's prior encephalopathy from cefepime 08/2021 -Review of the record shows the patient was dosed with oxycodone 5 mg at 1818 on 02/01/2023 -CT brain negative for acute findings but showed cerebellar and cerebral atrophy -02/02/2023 ABG 7.4 7/39/59/28 -pt also received conscious sedation for her L-AVG procedure 7/12 -B12--812 -folate--14.6 -TSH--2.719 -opioid changed to norco 10/325>>tolerating ok with improved mental status -7/15--mental status back to baseline   ESRD -Appreciate nephrology -Continue maintenance dialysis MWF -last HD 7/13   Left AVG stenosis/dysfunction -S/p angioplasty by IR 02/01/2023 -02/01/2023 shuntogram showed 50% stenosis at the venous anastomosis   Essential hypertension -Continue carvedilol and hydralazine   Anemia of CKD/iron deficiency -ESA per  nephrology   Generalized weakness -due to pneumonia, deconditioning,  polypharmacy -repeat PT eval>>SNF                   Family Communication: daughter udpated 7/15   Consultants:  renal   Code Status:  FULL    DVT Prophylaxis:   Heparin     Procedures: As Listed in Progress Note Above   Antibiotics: Ceftriaxone 7/9>>7/11 Azithro 7/9>>               Subjective: Patient denies fevers, chills, headache, chest pain, dyspnea, nausea, vomiting, diarrhea, abdominal pain, dysuria, hematuria, hematochezia, and melena.   Objective: Vitals:   02/03/23 0844 02/03/23 1345 02/03/23 2107 02/04/23 0537  BP: (!) 152/57 (!) 138/58 (!) 153/70 (!) 167/74  Pulse: 92 79 88 80  Resp:   (!) 21 18  Temp:  98.5 F (36.9 C) 98.2 F (36.8 C) 98 F (36.7 C)  TempSrc:  Oral Oral Oral  SpO2: 96% 99% 98% 98%  Weight:      Height:        Intake/Output Summary (Last 24 hours) at 02/04/2023 1246 Last data filed at 02/04/2023 1000 Gross per 24 hour  Intake 240 ml  Output --  Net 240 ml   Weight change:  Exam:  General:  Pt is alert, follows commands appropriately, not in acute distress HEENT: No icterus, No thrush, No neck mass, [place]/AT Cardiovascular: RRR, S1/S2, no rubs, no gallops Respiratory: bibasilar rales.  No wheeze Abdomen: Soft/+BS, non tender, non distended, no guarding Extremities: No edema, No lymphangitis, No petechiae, No rashes, no synovitis   Data Reviewed: I have personally reviewed following labs and imaging studies Basic Metabolic Panel: Recent Labs  Lab 01/29/23 0032 01/29/23 0444 01/30/23 0407 02/01/23 0957  NA 136 136 136 134*  K 3.2* 3.0* 4.3 5.1  CL 96* 98 98 96*  CO2 28 26 26 25   GLUCOSE 90 83 113* 98  BUN 28* 31* 53* 60*  CREATININE 4.13* 4.46* 6.01* 6.05*  CALCIUM 7.6* 7.4* 7.9* 8.5*  MG  --  1.9 2.2  --   PHOS  --   --   --  6.2*   Liver Function Tests: Recent Labs  Lab 01/29/23 0032 01/29/23 0444 01/30/23 0407  02/01/23 0957  AST 28 24 35  --   ALT 18 16 20   --   ALKPHOS 92 75 85  --   BILITOT 0.5 0.7 0.4  --   PROT 6.6 5.6* 6.2*  --   ALBUMIN 2.4* 2.0* 2.1* 2.1*   No results for input(s): "LIPASE", "AMYLASE" in the last 168 hours. Recent Labs  Lab 02/02/23 0924  AMMONIA 17   Coagulation Profile: Recent Labs  Lab 01/29/23 0032  INR 1.2   CBC: Recent Labs  Lab 01/29/23 0032 01/29/23 0444 01/30/23 0407 02/01/23 0957 02/02/23 0924  WBC 11.0* 8.9 8.5 10.0 10.5  NEUTROABS 9.4* 7.3  --   --   --   HGB 8.2* 7.1* 7.4* 7.9* 8.1*  HCT 25.9* 22.4* 24.6* 26.1* 26.2*  MCV 93.2 92.6 95.7 94.2 91.3  PLT 305 279 300 308 319   Cardiac Enzymes: No results for input(s): "CKTOTAL", "CKMB", "CKMBINDEX", "TROPONINI" in the last 168 hours. BNP: Invalid input(s): "POCBNP" CBG: No results for input(s): "GLUCAP" in the last 168 hours. HbA1C: No results for input(s): "HGBA1C" in the last 72 hours. Urine analysis:    Component Value Date/Time   COLORURINE COLORLESS (A) 08/29/2021 0827   APPEARANCEUR CLEAR 08/29/2021 0827   LABSPEC 1.005 08/29/2021 0827   PHURINE  8.0 08/29/2021 0827   GLUCOSEU >=500 (A) 08/29/2021 0827   HGBUR NEGATIVE 08/29/2021 0827   BILIRUBINUR NEGATIVE 08/29/2021 0827   KETONESUR NEGATIVE 08/29/2021 0827   PROTEINUR 30 (A) 08/29/2021 0827   UROBILINOGEN 0.2 01/11/2014 1635   NITRITE NEGATIVE 08/29/2021 0827   LEUKOCYTESUR NEGATIVE 08/29/2021 0827   Sepsis Labs: @LABRCNTIP (procalcitonin:4,lacticidven:4) ) Recent Results (from the past 240 hour(s))  Culture, blood (Routine x 2)     Status: None   Collection Time: 01/29/23 12:32 AM   Specimen: BLOOD RIGHT ARM  Result Value Ref Range Status   Specimen Description BLOOD RIGHT ARM  Final   Special Requests   Final    BOTTLES DRAWN AEROBIC AND ANAEROBIC Blood Culture adequate volume   Culture   Final    NO GROWTH 5 DAYS Performed at [facility], [address], [place], [place] [address]    Report Status  02/03/2023 FINAL  Final  Culture, blood (Routine x 2)     Status: None   Collection Time: 01/29/23 12:32 AM   Specimen: BLOOD RIGHT HAND  Result Value Ref Range Status   Specimen Description BLOOD RIGHT HAND  Final   Special Requests   Final    BOTTLES DRAWN AEROBIC AND ANAEROBIC Blood Culture adequate volume   Culture   Final    NO GROWTH 5 DAYS Performed at [facility], [address], [place], [place] [address]    Report Status 02/03/2023 FINAL  Final  SARS Coronavirus 2 by RT PCR (hospital order, performed in [facility] hospital lab) *cepheid single result test* Anterior Nasal Swab     Status: None   Collection Time: 01/29/23  2:28 AM   Specimen: Anterior Nasal Swab  Result Value Ref Range Status   SARS Coronavirus 2 by RT PCR NEGATIVE NEGATIVE Final    Comment: (NOTE) SARS-CoV-2 target nucleic acids are NOT DETECTED.  The SARS-CoV-2 RNA is generally detectable in upper and lower respiratory specimens during the acute phase of infection. The lowest concentration of SARS-CoV-2 viral copies this assay can detect is 250 copies / mL. A negative result does not preclude SARS-CoV-2 infection and should not be used as the sole basis for treatment or other patient management decisions.  A negative result may occur with improper specimen collection / handling, submission of specimen other than nasopharyngeal swab, presence of viral mutation(s) within the areas targeted by this assay, and inadequate number of viral copies (<250 copies / mL). A negative result must be combined with clinical observations, patient history, and epidemiological information.  Fact Sheet for Patients:   RoadLapTop.co.za  Fact Sheet for Healthcare Providers: http://kim-miller.com/  This test is not yet approved or  cleared by the [place] FDA and has been authorized for detection and/or diagnosis of SARS-CoV-2 by FDA under an Emergency Use Authorization  (EUA).  This EUA will remain in effect (meaning this test can be used) for the duration of the COVID-19 declaration under Section 564(b)(1) of the Act, 21 U.S.C. section 360bbb-3(b)(1), unless the authorization is terminated or revoked sooner.  Performed at [facility], [address], [place], [place] [address]   MRSA Next Gen by PCR, Nasal     Status: None   Collection Time: 02/02/23  9:25 AM   Specimen: Nasal Mucosa; Nasal Swab  Result Value Ref Range Status   MRSA by PCR Next Gen NOT DETECTED NOT DETECTED Final    Comment: (NOTE) The GeneXpert MRSA Assay (FDA approved for NASAL specimens only), is one component of  a comprehensive MRSA colonization surveillance program. It is not intended to diagnose MRSA infection nor to guide or monitor treatment for MRSA infections. Test performance is not FDA approved in patients less than 71 years old. Performed at [facility], [address] [place], [place] [address]   Respiratory (~20 pathogens) panel by PCR     Status: None   Collection Time: 02/02/23  9:28 AM   Specimen: Nasopharyngeal Swab; Respiratory  Result Value Ref Range Status   Adenovirus NOT DETECTED NOT DETECTED Final   Coronavirus 229E NOT DETECTED NOT DETECTED Final    Comment: (NOTE) The Coronavirus on the Respiratory Panel, DOES NOT test for the novel  Coronavirus (2019 nCoV)    Coronavirus HKU1 NOT DETECTED NOT DETECTED Final   Coronavirus NL63 NOT DETECTED NOT DETECTED Final   Coronavirus OC43 NOT DETECTED NOT DETECTED Final   Metapneumovirus NOT DETECTED NOT DETECTED Final   Rhinovirus / Enterovirus NOT DETECTED NOT DETECTED Final   Influenza A NOT DETECTED NOT DETECTED Final   Influenza B NOT DETECTED NOT DETECTED Final   Parainfluenza Virus 1 NOT DETECTED NOT DETECTED Final   Parainfluenza Virus 2 NOT DETECTED NOT DETECTED Final   Parainfluenza Virus 3 NOT DETECTED NOT DETECTED Final   Parainfluenza Virus 4 NOT DETECTED NOT DETECTED Final   Respiratory  Syncytial Virus NOT DETECTED NOT DETECTED Final   Bordetella pertussis NOT DETECTED NOT DETECTED Final   Bordetella Parapertussis NOT DETECTED NOT DETECTED Final   Chlamydophila pneumoniae NOT DETECTED NOT DETECTED Final   Mycoplasma pneumoniae NOT DETECTED NOT DETECTED Final    Comment: Performed at [facility], [address] [place], [place] [address]     Scheduled Meds:  (feeding supplement) PROSource Plus  30 mL Oral BID BM   carvedilol  12.5 mg Oral BID   Chlorhexidine Gluconate Cloth  6 each Topical Q0600   Chlorhexidine Gluconate Cloth  6 each Topical Q0600   darbepoetin (ARANESP) injection - DIALYSIS  200 mcg Subcutaneous Q Mon-1800   heparin  5,000 Units Subcutaneous Q8H   hydrALAZINE  50 mg Oral BID   Continuous Infusions:  piperacillin-tazobactam (ZOSYN)  IV 2.25 g (02/04/23 0544)    Procedures/Studies: CT CHEST WO CONTRAST  Result Date: 02/02/2023 CLINICAL DATA:  82 year old female with history of end-stage renal disease. Possible pneumonia. Abnormal chest x-ray. EXAM: CT CHEST WITHOUT CONTRAST TECHNIQUE: Multidetector CT imaging of the chest was performed following the standard protocol without IV contrast. RADIATION DOSE REDUCTION: This exam was performed according to the departmental dose-optimization program which includes automated exposure control, adjustment of the mA and/or kV according to patient size and/or use of iterative reconstruction technique. COMPARISON:  Chest x-ray 01/28/2023.  Chest CTA 07/11/2022. FINDINGS: Cardiovascular: Heart size is mildly enlarged. There is no significant pericardial fluid, thickening or pericardial calcification. There is aortic atherosclerosis, as well as atherosclerosis of the great vessels of the mediastinum and the coronary arteries, including calcified atherosclerotic plaque in the left main, left anterior descending, left circumflex and right coronary arteries. Calcifications of the aortic valve. Mediastinum/Nodes: No  pathologically enlarged mediastinal or hilar lymph nodes. Multiple prominent borderline enlarged mediastinal and bilateral hilar lymph nodes are noted, nonspecific. Esophagus is unremarkable in appearance. No axillary lymphadenopathy. Lungs/Pleura: Moderate to large bilateral pleural effusions lying dependently with areas of passive atelectasis in the lower lobes of the lungs bilaterally. Aerated portions of the lungs are remarkable for widespread ground-glass attenuation, interlobular septal thickening and patchy areas of what appears to be nodular  airspace consolidation, likely reflective of edema. Study is limited by respiratory motion, but no large suspicious appearing pulmonary nodules or masses are confidently identified. Upper Abdomen: Aortic atherosclerosis. Visualized portions of the left kidney demonstrates severe left renal atrophy. Low-attenuation lesion in the upper pole of the left kidney measuring 1.7 cm in diameter, incompletely characterized on today's noncontrast CT examination, but statistically likely a cyst (no imaging follow-up recommended). Musculoskeletal: There are no aggressive appearing lytic or blastic lesions noted in the visualized portions of the skeleton. IMPRESSION: 1. Overall, the appearance of the chest is most suggestive of congestive heart failure with cardiomegaly, pulmonary edema and moderate to large bilateral pleural effusions with extensive passive atelectasis in the dependent portions of the lungs bilaterally. 2. Aortic atherosclerosis, in addition to left main and three-vessel coronary artery disease. 3. There are calcifications of the aortic valve. Echocardiographic correlation for evaluation of potential valvular dysfunction may be warranted if clinically indicated. 4. Additional incidental findings, as above. Aortic Atherosclerosis (ICD10-I70.0). Electronically Signed   By: Trudie Reed M.D.   On: 02/02/2023 13:28   CT HEAD WO CONTRAST ( )  Result Date:  02/02/2023 CLINICAL DATA:  82 year old female with history of altered mental status. EXAM: CT HEAD WITHOUT CONTRAST TECHNIQUE: Contiguous axial images were obtained from the base of the skull through the vertex without intravenous contrast. RADIATION DOSE REDUCTION: This exam was performed according to the departmental dose-optimization program which includes automated exposure control, adjustment of the mA and/or kV according to patient size and/or use of iterative reconstruction technique. COMPARISON:  Head CT 08/31/2021. FINDINGS: Brain: Mild cerebral and cerebellar atrophy. Patchy and confluent areas of decreased attenuation are noted throughout the deep and periventricular white matter of the cerebral hemispheres bilaterally, compatible with chronic microvascular ischemic disease. No evidence of acute infarction, hemorrhage, hydrocephalus, extra-axial collection or mass lesion/mass effect. Vascular: No hyperdense vessel or unexpected calcification. Skull: Normal. Negative for fracture or focal lesion. Sinuses/Orbits: No acute finding. Other: None. IMPRESSION: 1. No acute intracranial abnormalities. 2. Mild cerebral and cerebellar atrophy with mild chronic microvascular ischemic changes in the cerebral white matter, as above. Electronically Signed   By: Trudie Reed M.D.   On: 02/02/2023 07:10   IR THROMBECTOMY AV FISTULA W/THROMBOLYSIS/PTA INC/SHUNT/IMG LEFT  Result Date: 02/01/2023 INDICATION: 82 year old with concern for partial thrombosis of the left upper arm AV graft. EXAM: 1. Left upper extremity shuntogram 2. Balloon angioplasty of graft/vein 3. Ultrasound guidance for vascular access MEDICATIONS: Moderate sedation ANESTHESIA/SEDATION: Moderate (conscious) sedation was employed during this procedure. A total of Versed 1.5mg  and fentanyl 75 mcg was administered intravenously at the order of the provider performing the procedure. Total intra-service moderate sedation time: 45 minutes. Patient's  level of consciousness and vital signs were monitored continuously by radiology nurse throughout the procedure under the supervision of the provider performing the procedure. FLUOROSCOPY: Radiation Exposure Index (as provided by the fluoroscopic device): 13 mGy Kerma CONTRAST:  40 mL Omnipaque 300 COMPLICATIONS: None immediate. TECHNIQUE: Informed written consent was obtained from the patient after a thorough discussion of the procedural risks, benefits and alternatives. All questions were addressed. Maximal Sterile Barrier Technique was utilized including caps, mask, sterile gowns, sterile gloves, sterile drape, hand hygiene and skin antiseptic. A timeout was performed prior to the initiation of the procedure. Ultrasound demonstrated a patent left upper arm AV graft. Ultrasound image was saved for documentation. Left upper arm was prepped and draped in sterile fashion. Skin was anesthetized using 1% lidocaine. Using ultrasound guidance, a 21 gauge  needle was directed into the distal aspect of the graft. Micropuncture dilator set was placed. Shuntogram images were obtained. Stenosis near the venous anastomosis was targeted for treatment. 6 [place] vascular sheath was placed over a Bentson wire and Bentson wire was advanced centrally using a Kumpe catheter. The area of stenosis was treated with a 6 mm x 40 mm Mustang balloon. Follow-up shuntogram images were obtained. The area of stenosis was treated again with a 7 mm x 40 mm Mustang balloon. Follow-up shuntogram images were obtained. Vascular sheath was removed with a pursestring suture. FINDINGS: Left upper arm AV graft was patent but there was a stenosis near the venous anastomosis measuring approximately 50%. There was retrograde filling of a large vein down the left arm. Central veins were patent. Arterial anastomosis was widely patent but there was slight kink in the graft just beyond the arterial anastomosis. This configuration of the graft near the arterial  anastomosis did not appear to be flow limiting by ultrasound. The area of stenosis near the venous anastomosis was treated with balloon angioplasty using 6 mm and 7 mm balloons. Near complete resolution of the venous stenosis following balloon angioplasty. At the end of the procedure, there was decreased filling of the branch vessels near the venous anastomosis. IMPRESSION: 1. Patent left upper arm AV graft. 2. Successful treatment of the stenosis near the venous anastomosis using balloon angioplasty. Electronically Signed   By: Richarda Overlie M.D.   On: 02/01/2023 23:16   DG Chest Portable 1 View  Result Date: 01/29/2023 CLINICAL DATA:  Fever, questionable sepsis EXAM: PORTABLE CHEST 1 VIEW COMPARISON:  01/06/2023 FINDINGS: Heart is borderline in size. Aortic atherosclerosis. Diffuse interstitial prominence with bilateral lower lobe airspace opacities and layering effusions. Favor edema although infection is not excluded. Airspace opacities and effusions have worsened since prior study. IMPRESSION: Diffuse interstitial prominence with lower lobe airspace opacities and layering effusions. Favor edema although infection is not excluded. Electronically Signed   By: Charlett Nose M.D.   On: 01/29/2023 00:02   DG Chest Portable 1 View  Result Date: 01/06/2023 CLINICAL DATA:  Short of breath EXAM: PORTABLE CHEST 1 VIEW COMPARISON:  Prior chest x-ray 10/24/2022 FINDINGS: Cardiomegaly with pulmonary vascular congestion extensive bronchitic changes. Patchy foci of increased interstitial airspace opacity present scattered throughout the right and left lung. Probable small bilateral pleural effusions and associated bibasilar atelectasis. Atherosclerotic calcifications present in the transverse aorta. No acute osseous abnormality. IMPRESSION: 1. Cardiomegaly with pulmonary vascular congestion bordering on mild edema. 2. Multifocal patchy airspace opacities bilaterally. Differential considerations include areas of asymmetric  pulmonary edema versus multifocal pneumonia including viral pneumonia. 3. Small bilateral pleural effusions and associated bibasilar atelectasis. 4. Aortic atherosclerotic vascular calcifications. Electronically Signed   By: Malachy Moan M.D.   On: 01/06/2023 10:03    Catarina Hartshorn, DO  [facility]  If 7PM-7AM, please contact night-coverage [email or web address] Password [facility] 02/04/2023, 12:46 PM   LOS: 5 days

## 2023-02-04 NOTE — Progress Notes (Signed)
Subjective:  chart reviewed-  some confusion over the weekend but now better-  also concern for AVG clotting underwent intervention per IR on Friday then got HD off schedule on 7/13.  This AM pt c/o nausea, SOB, itching and right eye redness -  says she felt better at home   Objective Vital signs in last 24 hours: Vitals:   02/03/23 0844 02/03/23 1345 02/03/23 2107 02/04/23 0537  BP: (!) 152/57 (!) 138/58 (!) 153/70 (!) 167/74  Pulse: 92 79 88 80  Resp:   (!) 21 18  Temp:  98.5 F (36.9 C) 98.2 F (36.8 C) 98 F (36.7 C)  TempSrc:  Oral Oral   SpO2: 96% 99% 98% 98%  Weight:      Height:       Weight change:   Intake/Output Summary (Last 24 hours) at 02/04/2023 0746 Last data filed at 02/03/2023 0900 Gross per 24 hour  Intake 240 ml  Output --  Net 240 ml   Dialysis Orders: Center: [facility]  on MWF . EDW 43.5kg HD Bath 1K/2.5Ca  Time 3:15 Heparin 1000 unit bolus then 1200 units/hr. Access LAVG BFR 400 DFR 500  Profile    Assessment/ Plan: Pt is a 82 y.o. yo female who was admitted on 01/28/2023 with  PNA, sepsis and confusion  Assessment/Plan: 1. PNA/sepsis-  on zosyn-  WBC improved -  still req O2-  overall clinically slow to improve 2. ESRD-  normally MWF [name] per AVG-  last HD on 7/13 off schedule-  HD today or tomorrow based on HD nurse availibility-  no acute need but will likely do today  3. Anemia-  hgb high 7's low 8's -  cont ESA and supportive care-  4. Secondary hyperparathyroidism-   currently on no meds -  phos slightly up-  corrected calcium ok -  she says all binders make her sick-  was taking tums-  I think we can hold off for now given active nausea 5. HTN/volume-  both slightly up 48 hours after last HD-  cont coreg and hydralazine   Cecille Aver    Labs: Basic Metabolic Panel: Recent Labs  Lab 01/29/23 0444 01/30/23 0407 02/01/23 0957  NA 136 136 134*  K 3.0* 4.3 5.1  CL 98 98 96*  CO2 26 26 25   GLUCOSE 83 113* 98  BUN 31* 53*  60*  CREATININE 4.46* 6.01* 6.05*  CALCIUM 7.4* 7.9* 8.5*  PHOS  --   --  6.2*   Liver Function Tests: Recent Labs  Lab 01/29/23 0032 01/29/23 0444 01/30/23 0407 02/01/23 0957  AST 28 24 35  --   ALT 18 16 20   --   ALKPHOS 92 75 85  --   BILITOT 0.5 0.7 0.4  --   PROT 6.6 5.6* 6.2*  --   ALBUMIN 2.4* 2.0* 2.1* 2.1*   No results for input(s): "LIPASE", "AMYLASE" in the last 168 hours. Recent Labs  Lab 02/02/23 0924  AMMONIA 17   CBC: Recent Labs  Lab 01/29/23 0032 01/29/23 0444 01/30/23 0407 02/01/23 0957 02/02/23 0924  WBC 11.0* 8.9 8.5 10.0 10.5  NEUTROABS 9.4* 7.3  --   --   --   HGB 8.2* 7.1* 7.4* 7.9* 8.1*  HCT 25.9* 22.4* 24.6* 26.1* 26.2*  MCV 93.2 92.6 95.7 94.2 91.3  PLT 305 279 300 308 319   Cardiac Enzymes: No results for input(s): "CKTOTAL", "CKMB", "CKMBINDEX", "TROPONINI" in the last 168 hours. CBG: No results  for input(s): "GLUCAP" in the last 168 hours.  Iron Studies: No results for input(s): "IRON", "TIBC", "TRANSFERRIN", "FERRITIN" in the last 72 hours. Studies/Results: CT CHEST WO CONTRAST  Result Date: 02/02/2023 CLINICAL DATA:  82 year old female with history of end-stage renal disease. Possible pneumonia. Abnormal chest x-ray. EXAM: CT CHEST WITHOUT CONTRAST TECHNIQUE: Multidetector CT imaging of the chest was performed following the standard protocol without IV contrast. RADIATION DOSE REDUCTION: This exam was performed according to the departmental dose-optimization program which includes automated exposure control, adjustment of the mA and/or kV according to patient size and/or use of iterative reconstruction technique. COMPARISON:  Chest x-ray 01/28/2023.  Chest CTA 07/11/2022. FINDINGS: Cardiovascular: Heart size is mildly enlarged. There is no significant pericardial fluid, thickening or pericardial calcification. There is aortic atherosclerosis, as well as atherosclerosis of the great vessels of the mediastinum and the coronary arteries,  including calcified atherosclerotic plaque in the left main, left anterior descending, left circumflex and right coronary arteries. Calcifications of the aortic valve. Mediastinum/Nodes: No pathologically enlarged mediastinal or hilar lymph nodes. Multiple prominent borderline enlarged mediastinal and bilateral hilar lymph nodes are noted, nonspecific. Esophagus is unremarkable in appearance. No axillary lymphadenopathy. Lungs/Pleura: Moderate to large bilateral pleural effusions lying dependently with areas of passive atelectasis in the lower lobes of the lungs bilaterally. Aerated portions of the lungs are remarkable for widespread ground-glass attenuation, interlobular septal thickening and patchy areas of what appears to be nodular airspace consolidation, likely reflective of edema. Study is limited by respiratory motion, but no large suspicious appearing pulmonary nodules or masses are confidently identified. Upper Abdomen: Aortic atherosclerosis. Visualized portions of the left kidney demonstrates severe left renal atrophy. Low-attenuation lesion in the upper pole of the left kidney measuring 1.7 cm in diameter, incompletely characterized on today's noncontrast CT examination, but statistically likely a cyst (no imaging follow-up recommended). Musculoskeletal: There are no aggressive appearing lytic or blastic lesions noted in the visualized portions of the skeleton. IMPRESSION: 1. Overall, the appearance of the chest is most suggestive of congestive heart failure with cardiomegaly, pulmonary edema and moderate to large bilateral pleural effusions with extensive passive atelectasis in the dependent portions of the lungs bilaterally. 2. Aortic atherosclerosis, in addition to left main and three-vessel coronary artery disease. 3. There are calcifications of the aortic valve. Echocardiographic correlation for evaluation of potential valvular dysfunction may be warranted if clinically indicated. 4. Additional  incidental findings, as above. Aortic Atherosclerosis (ICD10-I70.0). Electronically Signed   By: Trudie Reed M.D.   On: 02/02/2023 13:28   Medications: Infusions:  piperacillin-tazobactam (ZOSYN)  IV 2.25 g (02/04/23 0544)    Scheduled Medications:  (feeding supplement) PROSource Plus  30 mL Oral BID BM   carvedilol  12.5 mg Oral BID   Chlorhexidine Gluconate Cloth  6 each Topical Q0600   heparin  5,000 Units Subcutaneous Q8H   hydrALAZINE  50 mg Oral BID    have reviewed scheduled and prn medications.  Physical Exam: General: thin, pleasant-  NAD but some rapid resps Heart:RRR Lungs: inc RR-  mostly clear Abdomen: soft , non tender Extremities: no edema  Dialysis Access: right AVG-  patent     02/04/2023,7:46 AM  LOS: 5 days

## 2023-02-05 ENCOUNTER — Other Ambulatory Visit (HOSPITAL_COMMUNITY): Payer: Self-pay | Admitting: Nephrology

## 2023-02-05 ENCOUNTER — Telehealth: Payer: Self-pay | Admitting: Gastroenterology

## 2023-02-05 ENCOUNTER — Encounter (HOSPITAL_COMMUNITY): Payer: Self-pay

## 2023-02-05 DIAGNOSIS — G9341 Metabolic encephalopathy: Secondary | ICD-10-CM | POA: Diagnosis not present

## 2023-02-05 DIAGNOSIS — Z992 Dependence on renal dialysis: Secondary | ICD-10-CM | POA: Diagnosis not present

## 2023-02-05 DIAGNOSIS — N186 End stage renal disease: Secondary | ICD-10-CM | POA: Diagnosis not present

## 2023-02-05 DIAGNOSIS — Z87898 Personal history of other specified conditions: Secondary | ICD-10-CM

## 2023-02-05 DIAGNOSIS — J181 Lobar pneumonia, unspecified organism: Secondary | ICD-10-CM | POA: Diagnosis not present

## 2023-02-05 LAB — HEPATITIS B SURFACE ANTIGEN: Hepatitis B Surface Ag: NONREACTIVE

## 2023-02-05 MED ORDER — CHLORHEXIDINE GLUCONATE CLOTH 2 % EX PADS
6.0000 | MEDICATED_PAD | Freq: Every day | CUTANEOUS | Status: DC
  Administered 2023-02-05: 6 via TOPICAL

## 2023-02-05 MED ORDER — PANTOPRAZOLE SODIUM 40 MG PO TBEC: 40.0000 mg | DELAYED_RELEASE_TABLET | Freq: Every day | ORAL | Status: DC

## 2023-02-05 MED ORDER — PROCHLORPERAZINE MALEATE 5 MG PO TABS: 5.0000 mg | ORAL_TABLET | Freq: Three times a day (TID) | ORAL | Status: DC

## 2023-02-05 MED ORDER — HYDROCODONE-ACETAMINOPHEN 10-325 MG PO TABS: 1.0000 | ORAL_TABLET | Freq: Four times a day (QID) | ORAL | 0 refills | Status: DC | PRN

## 2023-02-05 MED ORDER — PROSOURCE PLUS PO LIQD: 30.0000 mL | Freq: Two times a day (BID) | ORAL | Status: DC

## 2023-02-05 NOTE — TOC Transition Note (Signed)
Transition of Care [facility]) - CM/SW Discharge Note   Patient Details  Name: Felicia Williams MRN: [id number] Date of Birth: [birth year]  [facility] CM/SW Contact:  Villa Herb, LCSWA Phone Number: 02/05/2023, 12:49 PM   Clinical Narrative:    CSW updated that pt is medically ready for D/C to [facility] SNF. CSW updated [name] in admissions who states they are ready to accept today. CSW left secure VM for pts daughter to update. CSW provided RN with numbers for room and report. CSW to call for [facility] transport when RN is ready. [facility] waiver signed and added to chart. TOC signing off.   Final next level of care: Skilled Nursing Facility Barriers to Discharge: Barriers Resolved   Patient Goals and CMS Choice CMS Medicare.gov Compare Post Acute Care list provided to:: Patient Choice offered to / list presented to : Patient  Discharge Placement                  Patient to be transferred to facility by: [facility] Name of family member notified: VM left for daughter Patient and family notified of of transfer: 02/05/23  Discharge Plan and Services Additional resources added to the After Visit Summary for   In-house Referral: Clinical Social Work Discharge Planning Services: CM Consult                                 Social Determinants of Health (SDOH) Interventions SDOH Screenings   Food Insecurity: No Food Insecurity (01/29/2023)  Housing: Low Risk  (01/29/2023)  Transportation Needs: No Transportation Needs (01/29/2023)  Utilities: Not At Risk (01/29/2023)  Tobacco Use: Low Risk  (01/28/2023)     Readmission Risk Interventions    01/31/2023    9:54 AM 10/25/2022    8:06 AM 10/15/2022   12:28 PM  Readmission Risk Prevention Plan  Transportation Screening Complete Complete Complete  PCP or Specialist Appt within 3-5 Days   Complete  HRI or Home Care Consult Complete Complete Complete  Social Work Consult for Recovery Care Planning/Counseling Complete Complete  Complete  Palliative Care Screening Not Applicable Not Applicable Not Applicable  Medication Review Oceanographer) Complete Complete Complete

## 2023-02-05 NOTE — Plan of Care (Signed)
  Problem: Education: Goal: Knowledge of General Education information will improve Description: Including pain rating scale, medication(s)/side effects and non-pharmacologic comfort measures Outcome: Progressing   Problem: Health Behavior/Discharge Planning: Goal: Ability to manage health-related needs will improve Outcome: Progressing   Problem: Clinical Measurements: Goal: Ability to maintain clinical measurements within normal limits will improve Outcome: Progressing Goal: Will remain free from infection Outcome: Progressing Goal: Diagnostic test results will improve Outcome: Progressing Goal: Respiratory complications will improve Outcome: Progressing Goal: Cardiovascular complication will be avoided Outcome: Progressing   Problem: Activity: Goal: Risk for activity intolerance will decrease Outcome: Progressing   Problem: Nutrition: Goal: Adequate nutrition will be maintained Outcome: Progressing   Problem: Coping: Goal: Level of anxiety will decrease Outcome: Progressing   Problem: Elimination: Goal: Will not experience complications related to bowel motility Outcome: Progressing   Problem: Pain Managment: Goal: General experience of comfort will improve Outcome: Progressing   Problem: Safety: Goal: Ability to remain free from injury will improve Outcome: Progressing   Problem: Skin Integrity: Goal: Risk for impaired skin integrity will decrease Outcome: Progressing   Problem: Activity: Goal: Ability to tolerate increased activity will improve Outcome: Progressing   Problem: Clinical Measurements: Goal: Ability to maintain a body temperature in the normal range will improve Outcome: Progressing   Problem: Respiratory: Goal: Ability to maintain adequate ventilation will improve Outcome: Progressing Goal: Ability to maintain a clear airway will improve Outcome: Progressing   

## 2023-02-05 NOTE — Progress Notes (Signed)
Subjective:  HD yest-  minimal UF by design-  says she is better-  eating breakfast-  thinks she is going to rehab today ?   Objective Vital signs in last 24 hours: Vitals:   02/04/23 1845 02/04/23 1910 02/04/23 1956 02/05/23 0347  BP: (!) 150/62 (!) 160/62 (!) 154/60 (!) 149/66  Pulse: 73 73 84 81  Resp: 16 18 18 17   Temp:  98.2 F (36.8 C) 98.7 F (37.1 C) 98.2 F (36.8 C)  TempSrc:  Oral Oral Oral  SpO2:  100% 96% 98%  Weight:      Height:       Weight change:   Intake/Output Summary (Last 24 hours) at 02/05/2023 0747 Last data filed at 02/04/2023 1910 Gross per 24 hour  Intake 730 ml  Output 600 ml  Net 130 ml   Dialysis Orders: Center: [facility]  on MWF . EDW 43.5kg HD Bath 1K/2.5Ca  Time 3:15 Heparin 1000 unit bolus then 1200 units/hr. Access LAVG BFR 400 DFR 500  Profile    Assessment/ Plan: Pt is a 82 y.o. yo female who was admitted on 01/28/2023 with  PNA, sepsis and confusion  Assessment/Plan: 1. PNA/sepsis-  on zosyn-  WBC improved -  still req O2 but is on it at home-   overall clinically slow to improve 2. ESRD-  normally MWF [name] per AVG-  HD on 7/13 off schedule, then 7/15-  next due tomorrow 7/17 on schedule-  can do here or at OP unit  3. Anemia-  hgb high 7's low 8's -  cont ESA and supportive care-  4. Secondary hyperparathyroidism-   currently on no meds -  phos up-  corrected calcium ok -  she says all binders make her sick-  was taking tums-  I think we can hold off on binder for now given active nausea 5. HTN/volume-  both slightly up -  cont coreg and hydralazine -  will try for a little more UF with HD tomorrow   Cecille Aver    Labs: Basic Metabolic Panel: Recent Labs  Lab 01/30/23 0407 02/01/23 0957 02/04/23 1627  NA 136 134* 133*  K 4.3 5.1 5.1  CL 98 96* 95*  CO2 26 25 25   GLUCOSE 113* 98 81  BUN 53* 60* 46*  CREATININE 6.01* 6.05* 6.05*  CALCIUM 7.9* 8.5* 8.5*  PHOS  --  6.2* 8.7*   Liver Function Tests: Recent  Labs  Lab 01/30/23 0407 02/01/23 0957 02/04/23 1627  AST 35  --   --   ALT 20  --   --   ALKPHOS 85  --   --   BILITOT 0.4  --   --   PROT 6.2*  --   --   ALBUMIN 2.1* 2.1* 2.0*   No results for input(s): "LIPASE", "AMYLASE" in the last 168 hours. Recent Labs  Lab 02/02/23 0924  AMMONIA 17   CBC: Recent Labs  Lab 01/30/23 0407 02/01/23 0957 02/02/23 0924 02/04/23 1627  WBC 8.5 10.0 10.5 7.1  HGB 7.4* 7.9* 8.1* 7.3*  HCT 24.6* 26.1* 26.2* 23.5*  MCV 95.7 94.2 91.3 91.4  PLT 300 308 319 282   Cardiac Enzymes: No results for input(s): "CKTOTAL", "CKMB", "CKMBINDEX", "TROPONINI" in the last 168 hours. CBG: No results for input(s): "GLUCAP" in the last 168 hours.  Iron Studies: No results for input(s): "IRON", "TIBC", "TRANSFERRIN", "FERRITIN" in the last 72 hours. Studies/Results: No results found. Medications: Infusions:  Scheduled Medications:  (feeding supplement) PROSource Plus  30 mL Oral BID BM   carvedilol  12.5 mg Oral BID   Chlorhexidine Gluconate Cloth  6 each Topical Q0600   Chlorhexidine Gluconate Cloth  6 each Topical Q0600   darbepoetin (ARANESP) injection - DIALYSIS  200 mcg Subcutaneous Q Mon-1800   doxycycline  100 mg Oral Q12H   heparin  5,000 Units Subcutaneous Q8H   hydrALAZINE  50 mg Oral BID   pantoprazole  40 mg Oral Daily   prochlorperazine  5 mg Oral TID AC    have reviewed scheduled and prn medications.  Physical Exam: General: thin, pleasant-  sitting up in bed Heart:RRR Lungs: inc RR-  mostly clear Abdomen: soft , non tender Extremities: no edema  Dialysis Access: right AVG-  patent     02/05/2023,7:47 AM  LOS: 6 days

## 2023-02-05 NOTE — Discharge Summary (Signed)
Physician Discharge Summary   Patient: Felicia Williams MRN: [id number] DOB: 17-Dec-1940  Admit date:     01/28/2023  Discharge date: 02/05/23  Discharge Physician: Onalee Hua Ettamae Barkett   PCP: Rebekah Chesterfield, NP   Recommendations at discharge:   Please follow up with primary care provider within 1-2 weeks  Please repeat BMP and CBC in one week     Hospital Course: 82 year old female with a history of glomerulonephritis now with end-stage renal disease had been on PD for many years and transition to hemodialysis February 2023. She is on MWF treatments other pertinent history includes hypertension, hyperlipidemia, gastroparesis, anxiety/depression, GERD, gouty arthritis, and anemia presenting from home secondary to generalized weakness and confusion.  Review the medical record shows that the patient was drowsy in the emergency department in part due to Ativan.  Daughter had stated that the patient had been short of breath for at least 2 months, but had worsened over the week prior to admission.  In the ED, the patient was noted to have a fever 101.3 F but she was hemodynamically stable.  Chest x-ray showed interstitial prominence with left lower lobe opacity.  The patient was started on ceftriaxone and azithromycin.  Nephrology was consulted for maintenance dialysis.  She was noted to have some hypoxia and placed on 3 L nasal cannula. It appears that her respiratory status gradually improved.  On 02/01/2023, there was difficulty accessing the patient's left AVG.  There was concern for partial clot.   IR was consulted.  The left arm shuntogram and balloon angioplasty was performed.  She was found to have 50% stenosis at the venous anastomosis and outflow vein.  She was treated with angioplasty with improvement.  In the evening of 02/01/2023, the patient apparently developed confusion.  Review of the record showed that the patient received oxycodone at 1818 on 02/01/2023.  The patient's daughter believes that  the patient's confusion was caused by ceftriaxone.  This was in part due to the patient's previous encephalopathy while receiving cefepime 08/2021.  Therefore, ceftriaxone was discontinued.  ABG was reassuring and showed 7.4 7/39/59/28.  CT of the brain was unremarkable for acute abnormalities, but showed mild cerebral and cerebellar atrophy. Her hospitalization has been prolonged secondary to development/recurrence of her delirium/encephalopathy which was likely related to the patient's sepsis as well as opioids.  Overall, her mental status gradually improved and returned to baseline at the time of discharge.  Assessment and Plan: Sepsis -Present on admission -Presented with fever, leukocytosis, tachycardia -Secondary to pneumonia -sepsis physiology resolved   Acute on chronic respiratory failure with hypoxia -Chronically on 2 L at home, 1L at rest -Initially on 3 L -Secondary to pneumonia -now back to 2L   Lobar pneumonia -01/28/2023 chest x-ray showed interstitial prominence and left lower lobe opacity -Patient started on ceftriaxone and azithromycin initially -now on zosyn -PCT 1.26 -CT chest--bilateral pleural effusions;  bilateral GGO with interlobular/septal thickening and patchy areas of nodular air space consolidations -finished 7 days of abx during the hospitalization   Acute metabolic encephalopathy -Multifactorial including polypharmacy, opioid and hypnotic medications, sepsis, and hospital delirium -Patient had developed confusion again on the evening of 02/01/2023 -Daughter believed this to be resulting from ceftriaxone due to the patient's prior encephalopathy from cefepime 08/2021 -Review of the record shows the patient was dosed with oxycodone 5 mg at 1818 on 02/01/2023 -CT brain negative for acute findings but showed cerebellar and cerebral atrophy -02/02/2023 ABG 7.4 7/39/59/28 -pt also received conscious sedation for her L-AVG  procedure  7/12 -B12--812 -folate--14.6 -TSH--2.719 -opioid changed to norco 10/325>>tolerating ok with improved mental status -7/15--mental status back to baseline  Dysphagia with N/V -review of medical record reveals hx of gastroparesis -pt states n/v only happens intermittently, worse when is ill -due to exacerbation of gastroparesis -GI consulted>>appreciated -compazine before each meal -add pantoprazole -overall improved -pt will have to eat 4-6 smaller meals -trial of reglan if no improvement, consider EGD only if no improvement with meds per GI -tolerating diet at time of dc   ESRD -Appreciate nephrology -Continue maintenance dialysis MWF -last HD 7/13   Left AVG stenosis/dysfunction -S/p angioplasty by IR 02/01/2023 -02/01/2023 shuntogram showed 50% stenosis at the venous anastomosis   Essential hypertension -Continue carvedilol and hydralazine   Anemia of CKD/iron deficiency -ESA per nephrology   Generalized weakness -due to pneumonia, deconditioning, polypharmacy -repeat PT eval>>SNF  Opioid Dependency/Chronic Back Pain -daughter does not want patient to take oxycodone -switched to norco 10/325>>pain manageable      Pain control - [place] Controlled Substance Reporting System database was reviewed. and patient was instructed, not to drive, operate heavy machinery, perform activities at heights, swimming or participation in water activities or provide baby-sitting services while on Pain, Sleep and Anxiety Medications; until their outpatient Physician has advised to do so again. Also recommended to not to take more than prescribed Pain, Sleep and Anxiety Medications.  Consultants: GI, renal Procedures performed: none  Disposition: Home Diet recommendation:  Renal diet DISCHARGE MEDICATION: Allergies as of 02/05/2023       Reactions   Cefepime Other (See Comments)   Feb 2023 Encephalopathy with questionable seizures. Seems to be tolerating cefazolin     Morphine Other (See Comments)   "Dry heaving like crazy"   Sulfa Antibiotics Other (See Comments)   Shut pt's kidneys down        Medication List     STOP taking these medications    camphor-menthol lotion Commonly known as: SARNA   cephALEXin 500 MG capsule Commonly known as: KEFLEX   oxyCODONE 5 MG immediate release tablet Commonly known as: Oxy IR/ROXICODONE       TAKE these medications    (feeding supplement) PROSource Plus liquid Take 30 mLs by mouth 2 (two) times daily between meals.   acetaminophen 325 MG tablet Commonly known as: TYLENOL Take 2 tablets (650 mg total) by mouth every 6 (six) hours as needed for mild pain.   carvedilol 12.5 MG tablet Commonly known as: COREG Take 1 tablet by mouth 2 (two) times daily.   famotidine 40 MG tablet Commonly known as: Pepcid Take 1 tablet (40 mg total) by mouth at bedtime. What changed:  when to take this reasons to take this   hydrALAZINE 50 MG tablet Commonly known as: APRESOLINE Take 50 mg by mouth 2 (two) times daily. Do not take morning dose on Dialysis days. Monday,Wednesday and Friday   HYDROcodone-acetaminophen 10-325 MG tablet Commonly known as: NORCO Take 1 tablet by mouth every 6 (six) hours as needed for moderate pain.   loperamide 2 MG capsule Commonly known as: IMODIUM Take 1 capsule (2 mg total) by mouth as needed for diarrhea or loose stools.   midodrine 10 MG tablet Commonly known as: PROAMATINE Take 1 tablet (10 mg total) by mouth every Monday, Wednesday, and Friday.   multivitamin Tabs tablet Take 1 tablet by mouth daily. One daily   nitroGLYCERIN 0.4 MG SL tablet Commonly known as: NITROSTAT Place 0.4 mg under the tongue every 5 (  five) minutes as needed for chest pain.   pantoprazole 40 MG tablet Commonly known as: PROTONIX Take 1 tablet (40 mg total) by mouth daily. Start taking on: February 06, 2023   prochlorperazine 5 MG tablet Commonly known as: COMPAZINE Take 1 tablet (5  mg total) by mouth 3 (three) times daily before meals.   traZODone 100 MG tablet Commonly known as: DESYREL Take 50 mg by mouth at bedtime.        Contact information for after-discharge care     Destination     [facility] Preferred SNF .   Service: Skilled Nursing Contact information: [address] [place] [address] [phone]                    Discharge Exam: Ceasar Mons Weights   01/30/23 1327 02/02/23 0500 02/04/23 1515  Weight: 43.5 kg 46.4 kg 46 kg   HEENT:  [place]/AT, No thrush, no icterus CV:  RRR, no rub, no S3, no S4 Lung:  bibasilar rales.  No wheeze Abd:  soft/+BS, NT Ext:  No edema, no lymphangitis, no synovitis, no rash   Condition at discharge: stable  The results of significant diagnostics from this hospitalization (including imaging, microbiology, ancillary and laboratory) are listed below for reference.   Imaging Studies: CT CHEST WO CONTRAST  Result Date: 02/02/2023 CLINICAL DATA:  82 year old female with history of end-stage renal disease. Possible pneumonia. Abnormal chest x-ray. EXAM: CT CHEST WITHOUT CONTRAST TECHNIQUE: Multidetector CT imaging of the chest was performed following the standard protocol without IV contrast. RADIATION DOSE REDUCTION: This exam was performed according to the departmental dose-optimization program which includes automated exposure control, adjustment of the mA and/or kV according to patient size and/or use of iterative reconstruction technique. COMPARISON:  Chest x-ray 01/28/2023.  Chest CTA 07/11/2022. FINDINGS: Cardiovascular: Heart size is mildly enlarged. There is no significant pericardial fluid, thickening or pericardial calcification. There is aortic atherosclerosis, as well as atherosclerosis of the great vessels of the mediastinum and the coronary arteries, including calcified atherosclerotic plaque in the left main, left anterior descending, left circumflex and right  coronary arteries. Calcifications of the aortic valve. Mediastinum/Nodes: No pathologically enlarged mediastinal or hilar lymph nodes. Multiple prominent borderline enlarged mediastinal and bilateral hilar lymph nodes are noted, nonspecific. Esophagus is unremarkable in appearance. No axillary lymphadenopathy. Lungs/Pleura: Moderate to large bilateral pleural effusions lying dependently with areas of passive atelectasis in the lower lobes of the lungs bilaterally. Aerated portions of the lungs are remarkable for widespread ground-glass attenuation, interlobular septal thickening and patchy areas of what appears to be nodular airspace consolidation, likely reflective of edema. Study is limited by respiratory motion, but no large suspicious appearing pulmonary nodules or masses are confidently identified. Upper Abdomen: Aortic atherosclerosis. Visualized portions of the left kidney demonstrates severe left renal atrophy. Low-attenuation lesion in the upper pole of the left kidney measuring 1.7 cm in diameter, incompletely characterized on today's noncontrast CT examination, but statistically likely a cyst (no imaging follow-up recommended). Musculoskeletal: There are no aggressive appearing lytic or blastic lesions noted in the visualized portions of the skeleton. IMPRESSION: 1. Overall, the appearance of the chest is most suggestive of congestive heart failure with cardiomegaly, pulmonary edema and moderate to large bilateral pleural effusions with extensive passive atelectasis in the dependent portions of the lungs bilaterally. 2. Aortic atherosclerosis, in addition to left main and three-vessel coronary artery disease. 3. There are calcifications of the aortic valve. Echocardiographic correlation for evaluation of  potential valvular dysfunction may be warranted if clinically indicated. 4. Additional incidental findings, as above. Aortic Atherosclerosis (ICD10-I70.0). Electronically Signed   By: Trudie Reed  M.D.   On: 02/02/2023 13:28   CT HEAD WO CONTRAST ( )  Result Date: 02/02/2023 CLINICAL DATA:  82 year old female with history of altered mental status. EXAM: CT HEAD WITHOUT CONTRAST TECHNIQUE: Contiguous axial images were obtained from the base of the skull through the vertex without intravenous contrast. RADIATION DOSE REDUCTION: This exam was performed according to the departmental dose-optimization program which includes automated exposure control, adjustment of the mA and/or kV according to patient size and/or use of iterative reconstruction technique. COMPARISON:  Head CT 08/31/2021. FINDINGS: Brain: Mild cerebral and cerebellar atrophy. Patchy and confluent areas of decreased attenuation are noted throughout the deep and periventricular white matter of the cerebral hemispheres bilaterally, compatible with chronic microvascular ischemic disease. No evidence of acute infarction, hemorrhage, hydrocephalus, extra-axial collection or mass lesion/mass effect. Vascular: No hyperdense vessel or unexpected calcification. Skull: Normal. Negative for fracture or focal lesion. Sinuses/Orbits: No acute finding. Other: None. IMPRESSION: 1. No acute intracranial abnormalities. 2. Mild cerebral and cerebellar atrophy with mild chronic microvascular ischemic changes in the cerebral white matter, as above. Electronically Signed   By: Trudie Reed M.D.   On: 02/02/2023 07:10   IR THROMBECTOMY AV FISTULA W/THROMBOLYSIS/PTA INC/SHUNT/IMG LEFT  Result Date: 02/01/2023 INDICATION: 82 year old with concern for partial thrombosis of the left upper arm AV graft. EXAM: 1. Left upper extremity shuntogram 2. Balloon angioplasty of graft/vein 3. Ultrasound guidance for vascular access MEDICATIONS: Moderate sedation ANESTHESIA/SEDATION: Moderate (conscious) sedation was employed during this procedure. A total of Versed 1.5mg  and fentanyl 75 mcg was administered intravenously at the order of the provider performing the  procedure. Total intra-service moderate sedation time: 45 minutes. Patient's level of consciousness and vital signs were monitored continuously by radiology nurse throughout the procedure under the supervision of the provider performing the procedure. FLUOROSCOPY: Radiation Exposure Index (as provided by the fluoroscopic device): 13 mGy Kerma CONTRAST:  40 mL Omnipaque 300 COMPLICATIONS: None immediate. TECHNIQUE: Informed written consent was obtained from the patient after a thorough discussion of the procedural risks, benefits and alternatives. All questions were addressed. Maximal Sterile Barrier Technique was utilized including caps, mask, sterile gowns, sterile gloves, sterile drape, hand hygiene and skin antiseptic. A timeout was performed prior to the initiation of the procedure. Ultrasound demonstrated a patent left upper arm AV graft. Ultrasound image was saved for documentation. Left upper arm was prepped and draped in sterile fashion. Skin was anesthetized using 1% lidocaine. Using ultrasound guidance, a 21 gauge needle was directed into the distal aspect of the graft. Micropuncture dilator set was placed. Shuntogram images were obtained. Stenosis near the venous anastomosis was targeted for treatment. 6 [place] vascular sheath was placed over a Bentson wire and Bentson wire was advanced centrally using a Kumpe catheter. The area of stenosis was treated with a 6 mm x 40 mm Mustang balloon. Follow-up shuntogram images were obtained. The area of stenosis was treated again with a 7 mm x 40 mm Mustang balloon. Follow-up shuntogram images were obtained. Vascular sheath was removed with a pursestring suture. FINDINGS: Left upper arm AV graft was patent but there was a stenosis near the venous anastomosis measuring approximately 50%. There was retrograde filling of a large vein down the left arm. Central veins were patent. Arterial anastomosis was widely patent but there was slight kink in the graft just beyond  the arterial anastomosis.  This configuration of the graft near the arterial anastomosis did not appear to be flow limiting by ultrasound. The area of stenosis near the venous anastomosis was treated with balloon angioplasty using 6 mm and 7 mm balloons. Near complete resolution of the venous stenosis following balloon angioplasty. At the end of the procedure, there was decreased filling of the branch vessels near the venous anastomosis. IMPRESSION: 1. Patent left upper arm AV graft. 2. Successful treatment of the stenosis near the venous anastomosis using balloon angioplasty. Electronically Signed   By: Richarda Overlie M.D.   On: 02/01/2023 23:16   DG Chest Portable 1 View  Result Date: 01/29/2023 CLINICAL DATA:  Fever, questionable sepsis EXAM: PORTABLE CHEST 1 VIEW COMPARISON:  01/06/2023 FINDINGS: Heart is borderline in size. Aortic atherosclerosis. Diffuse interstitial prominence with bilateral lower lobe airspace opacities and layering effusions. Favor edema although infection is not excluded. Airspace opacities and effusions have worsened since prior study. IMPRESSION: Diffuse interstitial prominence with lower lobe airspace opacities and layering effusions. Favor edema although infection is not excluded. Electronically Signed   By: Charlett Nose M.D.   On: 01/29/2023 00:02    Microbiology: Results for orders placed or performed during the hospital encounter of 01/28/23  Culture, blood (Routine x 2)     Status: None   Collection Time: 01/29/23 12:32 AM   Specimen: BLOOD RIGHT ARM  Result Value Ref Range Status   Specimen Description BLOOD RIGHT ARM  Final   Special Requests   Final    BOTTLES DRAWN AEROBIC AND ANAEROBIC Blood Culture adequate volume   Culture   Final    NO GROWTH 5 DAYS Performed at [facility], [address], [place], [place] [address]    Report Status 02/03/2023 FINAL  Final  Culture, blood (Routine x 2)     Status: None   Collection Time: 01/29/23 12:32 AM   Specimen: BLOOD  RIGHT HAND  Result Value Ref Range Status   Specimen Description BLOOD RIGHT HAND  Final   Special Requests   Final    BOTTLES DRAWN AEROBIC AND ANAEROBIC Blood Culture adequate volume   Culture   Final    NO GROWTH 5 DAYS Performed at [facility], [address], [place], [place] [address]    Report Status 02/03/2023 FINAL  Final  SARS Coronavirus 2 by RT PCR (hospital order, performed in [facility] hospital lab) *cepheid single result test* Anterior Nasal Swab     Status: None   Collection Time: 01/29/23  2:28 AM   Specimen: Anterior Nasal Swab  Result Value Ref Range Status   SARS Coronavirus 2 by RT PCR NEGATIVE NEGATIVE Final    Comment: (NOTE) SARS-CoV-2 target nucleic acids are NOT DETECTED.  The SARS-CoV-2 RNA is generally detectable in upper and lower respiratory specimens during the acute phase of infection. The lowest concentration of SARS-CoV-2 viral copies this assay can detect is 250 copies / mL. A negative result does not preclude SARS-CoV-2 infection and should not be used as the sole basis for treatment or other patient management decisions.  A negative result may occur with improper specimen collection / handling, submission of specimen other than nasopharyngeal swab, presence of viral mutation(s) within the areas targeted by this assay, and inadequate number of viral copies (<250 copies / mL). A negative result must be combined with clinical observations, patient history, and epidemiological information.  Fact Sheet for Patients:   RoadLapTop.co.za  Fact Sheet for Healthcare Providers: http://kim-miller.com/  This test is not  yet approved or  cleared by the [place] and has been authorized for detection and/or diagnosis of SARS-CoV-2 by FDA under an Emergency Use Authorization (EUA).  This EUA will remain in effect (meaning this test can be used) for the duration of the COVID-19 declaration under Section  564(b)(1) of the Act, 21 U.S.C. section 360bbb-3(b)(1), unless the authorization is terminated or revoked sooner.  Performed at [facility], [address], [place], [place] [address]   MRSA Next Gen by PCR, Nasal     Status: None   Collection Time: 02/02/23  9:25 AM   Specimen: Nasal Mucosa; Nasal Swab  Result Value Ref Range Status   MRSA by PCR Next Gen NOT DETECTED NOT DETECTED Final    Comment: (NOTE) The GeneXpert MRSA Assay (FDA approved for NASAL specimens only), is one component of a comprehensive MRSA colonization surveillance program. It is not intended to diagnose MRSA infection nor to guide or monitor treatment for MRSA infections. Test performance is not FDA approved in patients less than 11 years old. Performed at [facility], [address], [place], [place] [address]   Respiratory (~20 pathogens) panel by PCR     Status: None   Collection Time: 02/02/23  9:28 AM   Specimen: Nasopharyngeal Swab; Respiratory  Result Value Ref Range Status   Adenovirus NOT DETECTED NOT DETECTED Final   Coronavirus 229E NOT DETECTED NOT DETECTED Final    Comment: (NOTE) The Coronavirus on the Respiratory Panel, DOES NOT test for the novel  Coronavirus (2019 nCoV)    Coronavirus HKU1 NOT DETECTED NOT DETECTED Final   Coronavirus NL63 NOT DETECTED NOT DETECTED Final   Coronavirus OC43 NOT DETECTED NOT DETECTED Final   Metapneumovirus NOT DETECTED NOT DETECTED Final   Rhinovirus / Enterovirus NOT DETECTED NOT DETECTED Final   Influenza A NOT DETECTED NOT DETECTED Final   Influenza B NOT DETECTED NOT DETECTED Final   Parainfluenza Virus 1 NOT DETECTED NOT DETECTED Final   Parainfluenza Virus 2 NOT DETECTED NOT DETECTED Final   Parainfluenza Virus 3 NOT DETECTED NOT DETECTED Final   Parainfluenza Virus 4 NOT DETECTED NOT DETECTED Final   Respiratory Syncytial Virus NOT DETECTED NOT DETECTED Final   Bordetella pertussis NOT DETECTED NOT DETECTED Final   Bordetella Parapertussis NOT  DETECTED NOT DETECTED Final   Chlamydophila pneumoniae NOT DETECTED NOT DETECTED Final   Mycoplasma pneumoniae NOT DETECTED NOT DETECTED Final    Comment: Performed at [facility], [address], [place], [place] [address]    Labs: CBC: Recent Labs  Lab 01/30/23 0407 02/01/23 0957 02/02/23 0924 02/04/23 1627  WBC 8.5 10.0 10.5 7.1  HGB 7.4* 7.9* 8.1* 7.3*  HCT 24.6* 26.1* 26.2* 23.5*  MCV 95.7 94.2 91.3 91.4  PLT 300 308 319 282   Basic Metabolic Panel: Recent Labs  Lab 01/30/23 0407 02/01/23 0957 02/04/23 1627  NA 136 134* 133*  K 4.3 5.1 5.1  CL 98 96* 95*  CO2 26 25 25   GLUCOSE 113* 98 81  BUN 53* 60* 46*  CREATININE 6.01* 6.05* 6.05*  CALCIUM 7.9* 8.5* 8.5*  MG 2.2  --   --   PHOS  --  6.2* 8.7*   Liver Function Tests: Recent Labs  Lab 01/30/23 0407 02/01/23 0957 02/04/23 1627  AST 35  --   --   ALT 20  --   --   ALKPHOS 85  --   --   BILITOT 0.4  --   --  PROT 6.2*  --   --   ALBUMIN 2.1* 2.1* 2.0*   CBG: No results for input(s): "GLUCAP" in the last 168 hours.  Discharge time spent: greater than 30 minutes.  Signed: Catarina Hartshorn, MD [facility] 02/05/2023

## 2023-02-05 NOTE — Telephone Encounter (Signed)
Felicia Williams: Patient is being discharged from hospital today. Please arrange 2 week follow-up.

## 2023-02-05 NOTE — Care Management Important Message (Signed)
Important Message  Patient Details  Name: Felicia Williams MRN: [id number] Date of Birth: April 19, 1941   Medicare Important Message Given:  Yes     Corey Harold 02/05/2023, 1:02 PM

## 2023-02-05 NOTE — Progress Notes (Signed)
Patient is being discharged today. I spoke with her briefly. She reports prior nausea/vomiting has resolved with addition of PPI and compazine which she will continue. We will arrange follow-up in about 2 weeks.   Ermalinda Memos, PA-C [facility]

## 2023-02-05 NOTE — Progress Notes (Signed)
Nsg Discharge Note  Admit Date:  01/28/2023 Discharge date: 02/05/2023   Lesle Faron to be D/C'd Nursing Home per MD order.  AVS completed.  Copy for chart, and copy for patient signed, and dated. Patient/caregiver able to verbalize understanding.  Discharge Medication: Allergies as of 02/05/2023       Reactions   Cefepime Other (See Comments)   Feb 2023 Encephalopathy with questionable seizures. Seems to be tolerating cefazolin    Morphine Other (See Comments)   "Dry heaving like crazy"   Sulfa Antibiotics Other (See Comments)   Shut pt's kidneys down        Medication List     STOP taking these medications    camphor-menthol lotion Commonly known as: SARNA   cephALEXin 500 MG capsule Commonly known as: KEFLEX   oxyCODONE 5 MG immediate release tablet Commonly known as: Oxy IR/ROXICODONE       TAKE these medications    (feeding supplement) PROSource Plus liquid Take 30 mLs by mouth 2 (two) times daily between meals.   acetaminophen 325 MG tablet Commonly known as: TYLENOL Take 2 tablets (650 mg total) by mouth every 6 (six) hours as needed for mild pain.   carvedilol 12.5 MG tablet Commonly known as: COREG Take 1 tablet by mouth 2 (two) times daily.   famotidine 40 MG tablet Commonly known as: Pepcid Take 1 tablet (40 mg total) by mouth at bedtime. What changed:  when to take this reasons to take this   hydrALAZINE 50 MG tablet Commonly known as: APRESOLINE Take 50 mg by mouth 2 (two) times daily. Do not take morning dose on Dialysis days. Monday,Wednesday and Friday   HYDROcodone-acetaminophen 10-325 MG tablet Commonly known as: NORCO Take 1 tablet by mouth every 6 (six) hours as needed for moderate pain.   loperamide 2 MG capsule Commonly known as: IMODIUM Take 1 capsule (2 mg total) by mouth as needed for diarrhea or loose stools.   midodrine 10 MG tablet Commonly known as: PROAMATINE Take 1 tablet (10 mg total) by mouth every Monday,  Wednesday, and Friday.   multivitamin Tabs tablet Take 1 tablet by mouth daily. One daily   nitroGLYCERIN 0.4 MG SL tablet Commonly known as: NITROSTAT Place 0.4 mg under the tongue every 5 (five) minutes as needed for chest pain.   pantoprazole 40 MG tablet Commonly known as: PROTONIX Take 1 tablet (40 mg total) by mouth daily. Start taking on: February 06, 2023   prochlorperazine 5 MG tablet Commonly known as: COMPAZINE Take 1 tablet (5 mg total) by mouth 3 (three) times daily before meals.   traZODone 100 MG tablet Commonly known as: DESYREL Take 50 mg by mouth at bedtime.        Discharge Assessment: Vitals:   02/05/23 0347 02/05/23 1231  BP: (!) 149/66 (!) 147/48  Pulse: 81 77  Resp: 17 (!) 26  Temp: 98.2 F (36.8 C) 98.3 F (36.8 C)  SpO2: 98% 94%   Skin clean, dry and intact without evidence of skin break down, no evidence of skin tears noted. IV catheter discontinued intact. Site without signs and symptoms of complications - no redness or edema noted at insertion site, patient denies c/o pain - only slight tenderness at site.  Dressing with slight pressure applied.  D/c Instructions-Education: Discharge instructions given to patient/family with verbalized understanding. D/c education completed with patient/family including follow up instructions, medication list, d/c activities limitations if indicated, with other d/c instructions as indicated by MD - patient  able to verbalize understanding, all questions fully answered. Patient instructed to return to ED, call 911, or call MD for any changes in condition.  Patient escorted via WC, and D/C home via private auto.  Demetrio Lapping, LPN 1/61/0960 4:54 PM

## 2023-02-05 NOTE — Progress Notes (Signed)
Mobility Specialist Progress Note:   02/05/23 0942  Mobility  Activity Ambulated with assistance in hallway;Ambulated with assistance in room  Level of Assistance Contact guard assist, steadying assist  Assistive Device Front wheel walker  Distance Ambulated (ft) 30 ft  Range of Motion/Exercises Active;All extremities  Activity Response Tolerated well  Mobility Referral Yes  $Mobility charge 1 Mobility  Mobility Specialist Start Time (ACUTE ONLY) 0900  Mobility Specialist Stop Time (ACUTE ONLY) 0910  Mobility Specialist Time Calculation (min) (ACUTE ONLY) 10 min   Pt received in bed, agreeable to mobility session. Tolerated well, c/o SOB and leg weakness, SpO2 96% on 3L. Returned pt to bed, all needs met, call bell in reach.   Feliciana Rossetti Mobility Specialist Please contact via Special educational needs teacher or  Rehab office at [phone]

## 2023-02-06 LAB — HEPATITIS B SURFACE ANTIBODY, QUANTITATIVE: Hep B S AB Quant (Post): <3.5 m[IU]/mL — ABNORMAL LOW

## 2023-02-10 ENCOUNTER — Emergency Department (HOSPITAL_COMMUNITY): Payer: Medicare Other

## 2023-02-10 ENCOUNTER — Encounter (HOSPITAL_COMMUNITY): Payer: Self-pay | Admitting: Emergency Medicine

## 2023-02-10 ENCOUNTER — Other Ambulatory Visit: Payer: Self-pay

## 2023-02-10 ENCOUNTER — Observation Stay (HOSPITAL_COMMUNITY)
Admission: EM | Admit: 2023-02-10 | Discharge: 2023-02-12 | Disposition: A | Discharge status: Home / Self Care | Payer: Medicare Other | Attending: Family Medicine | Admitting: Family Medicine

## 2023-02-10 DIAGNOSIS — E8770 Fluid overload, unspecified: Secondary | ICD-10-CM | POA: Diagnosis present

## 2023-02-10 DIAGNOSIS — R2681 Unsteadiness on feet: Secondary | ICD-10-CM | POA: Insufficient documentation

## 2023-02-10 DIAGNOSIS — N189 Chronic kidney disease, unspecified: Secondary | ICD-10-CM

## 2023-02-10 DIAGNOSIS — I5030 Unspecified diastolic (congestive) heart failure: Secondary | ICD-10-CM | POA: Diagnosis present

## 2023-02-10 DIAGNOSIS — R2689 Other abnormalities of gait and mobility: Secondary | ICD-10-CM | POA: Diagnosis not present

## 2023-02-10 DIAGNOSIS — I132 Hypertensive heart and chronic kidney disease with heart failure and with stage 5 chronic kidney disease, or end stage renal disease: Secondary | ICD-10-CM | POA: Insufficient documentation

## 2023-02-10 DIAGNOSIS — N186 End stage renal disease: Principal | ICD-10-CM

## 2023-02-10 DIAGNOSIS — Z992 Dependence on renal dialysis: Secondary | ICD-10-CM | POA: Diagnosis not present

## 2023-02-10 DIAGNOSIS — R531 Weakness: Secondary | ICD-10-CM

## 2023-02-10 DIAGNOSIS — Z79899 Other long term (current) drug therapy: Secondary | ICD-10-CM | POA: Diagnosis not present

## 2023-02-10 DIAGNOSIS — I5040 Unspecified combined systolic (congestive) and diastolic (congestive) heart failure: Secondary | ICD-10-CM | POA: Diagnosis not present

## 2023-02-10 DIAGNOSIS — Z862 Personal history of diseases of the blood and blood-forming organs and certain disorders involving the immune mechanism: Secondary | ICD-10-CM

## 2023-02-10 DIAGNOSIS — J811 Chronic pulmonary edema: Secondary | ICD-10-CM | POA: Diagnosis not present

## 2023-02-10 DIAGNOSIS — M6281 Muscle weakness (generalized): Secondary | ICD-10-CM | POA: Diagnosis not present

## 2023-02-10 DIAGNOSIS — I10 Essential (primary) hypertension: Secondary | ICD-10-CM | POA: Diagnosis present

## 2023-02-10 DIAGNOSIS — J9601 Acute respiratory failure with hypoxia: Secondary | ICD-10-CM | POA: Diagnosis not present

## 2023-02-10 DIAGNOSIS — D631 Anemia in chronic kidney disease: Secondary | ICD-10-CM | POA: Insufficient documentation

## 2023-02-10 DIAGNOSIS — K219 Gastro-esophageal reflux disease without esophagitis: Secondary | ICD-10-CM | POA: Diagnosis present

## 2023-02-10 DIAGNOSIS — R0602 Shortness of breath: Secondary | ICD-10-CM

## 2023-02-10 LAB — CBC
HCT: 23.3 % — ABNORMAL LOW (ref 36.0–46.0)
Hemoglobin: 7.2 g/dL — ABNORMAL LOW (ref 12.0–15.0)
MCH: 28.6 pg (ref 26.0–34.0)
MCHC: 30.9 g/dL (ref 30.0–36.0)
MCV: 92.5 fL (ref 80.0–100.0)
Platelets: 327 10*3/uL (ref 150–400)
RBC: 2.52 MIL/uL — ABNORMAL LOW (ref 3.87–5.11)
RDW: 17.8 % — ABNORMAL HIGH (ref 11.5–15.5)
WBC: 11.4 10*3/uL — ABNORMAL HIGH (ref 4.0–10.5)
nRBC: 0 % (ref 0.0–0.2)

## 2023-02-10 LAB — BASIC METABOLIC PANEL
Anion gap: 13 (ref 5–15)
BUN: 53 mg/dL — ABNORMAL HIGH (ref 8–23)
CO2: 23 mmol/L (ref 22–32)
Calcium: 8.3 mg/dL — ABNORMAL LOW (ref 8.9–10.3)
Chloride: 96 mmol/L — ABNORMAL LOW (ref 98–111)
Creatinine, Ser: 7.02 mg/dL — ABNORMAL HIGH (ref 0.44–1.00)
GFR, Estimated: 5 mL/min — ABNORMAL LOW (ref >60)
Glucose, Bld: 120 mg/dL — ABNORMAL HIGH (ref 70–99)
Potassium: 3.8 mmol/L (ref 3.5–5.1)
Sodium: 132 mmol/L — ABNORMAL LOW (ref 135–145)

## 2023-02-10 MED ORDER — CHLORHEXIDINE GLUCONATE CLOTH 2 % EX PADS
6.0000 | MEDICATED_PAD | Freq: Every day | CUTANEOUS | Status: DC
  Administered 2023-02-10 – 2023-02-12 (×3): 6 via TOPICAL

## 2023-02-10 MED ORDER — ONDANSETRON HCL 4 MG PO TABS: 4.0000 mg | ORAL_TABLET | Freq: Four times a day (QID) | ORAL | Status: DC | PRN

## 2023-02-10 MED ORDER — ACETAMINOPHEN 325 MG PO TABS
650.0000 mg | ORAL_TABLET | Freq: Four times a day (QID) | ORAL | Status: DC | PRN
  Filled 2023-02-10: qty 2

## 2023-02-10 MED ORDER — HYDRALAZINE HCL 25 MG PO TABS
50.0000 mg | ORAL_TABLET | Freq: Two times a day (BID) | ORAL | Status: DC
  Administered 2023-02-11 – 2023-02-12 (×3): 50 mg via ORAL
  Filled 2023-02-10 (×4): qty 2

## 2023-02-10 MED ORDER — FAMOTIDINE 20 MG PO TABS
40.0000 mg | ORAL_TABLET | Freq: Every day | ORAL | Status: DC
  Administered 2023-02-11: 40 mg via ORAL
  Filled 2023-02-10: qty 2

## 2023-02-10 MED ORDER — HEPARIN SODIUM (PORCINE) 5000 UNIT/ML IJ SOLN
5000.0000 [IU] | Freq: Three times a day (TID) | INTRAMUSCULAR | Status: DC
  Administered 2023-02-11 (×2): 5000 [IU] via SUBCUTANEOUS
  Filled 2023-02-10 (×2): qty 1

## 2023-02-10 MED ORDER — PANTOPRAZOLE SODIUM 40 MG PO TBEC
40.0000 mg | DELAYED_RELEASE_TABLET | Freq: Every day | ORAL | Status: DC
  Administered 2023-02-12: 40 mg via ORAL
  Filled 2023-02-10 (×2): qty 1

## 2023-02-10 MED ORDER — ONDANSETRON HCL 4 MG/2ML IJ SOLN
4.0000 mg | Freq: Four times a day (QID) | INTRAMUSCULAR | Status: DC | PRN
  Administered 2023-02-11: 4 mg via INTRAVENOUS
  Filled 2023-02-10 (×2): qty 2

## 2023-02-10 MED ORDER — CARVEDILOL 12.5 MG PO TABS
12.5000 mg | ORAL_TABLET | Freq: Two times a day (BID) | ORAL | Status: DC
  Administered 2023-02-11 – 2023-02-12 (×4): 12.5 mg via ORAL
  Filled 2023-02-10 (×4): qty 1

## 2023-02-10 MED ORDER — OXYCODONE HCL 5 MG PO TABS
5.0000 mg | ORAL_TABLET | ORAL | Status: DC | PRN
  Administered 2023-02-11 – 2023-02-12 (×2): 5 mg via ORAL
  Filled 2023-02-10 (×2): qty 1

## 2023-02-10 MED ORDER — RENA-VITE PO TABS
1.0000 | ORAL_TABLET | Freq: Every day | ORAL | Status: DC
  Administered 2023-02-11 – 2023-02-12 (×2): 1 via ORAL
  Filled 2023-02-10 (×2): qty 1

## 2023-02-10 MED ORDER — ACETAMINOPHEN 650 MG RE SUPP: 650.0000 mg | Freq: Four times a day (QID) | RECTAL | Status: DC | PRN

## 2023-02-10 NOTE — ED Triage Notes (Signed)
Pt able to be titrated to home 2L, currently 95% on 2L

## 2023-02-10 NOTE — ED Triage Notes (Signed)
[facility].  Usual days 4x per week but dialysis facility cut her back to 3x /wk. Facility staff concerned this is not enough and that pt is in fluid overload. MWF, none missed this week.   EMS exam: Lungs clear, 100% on 5L (2L baseline ber EMS), 174/69, NSR, R forearm IV 20 G,

## 2023-02-10 NOTE — Progress Notes (Signed)
Informed of patient in the ER. ESRD on HD MWF. Presents with SOB, evidence of pulm edema on CXR. Apparently clinically doing okay. Will arrange for HD tomorrow first thing. If any changes with clinical status, then will need to move to [facility] for HD overnight. HD orders placed. Full consult to follow. Please call with any questions/concerns in the interim.  Anthony Sar, MD [facility]

## 2023-02-10 NOTE — ED Notes (Signed)
ED TO INPATIENT HANDOFF REPORT  ED Nurse Name and Phone #: Lowanda Foster [phone]  S Name/Age/Gender Felicia Williams 82 y.o. female Room/Bed: APA07/APA07  Code Status   Code Status: Full Code  Home/SNF/Other [facility] Re[facility] Patient oriented to: self, place, time, and situation Is this baseline? Yes   Triage Complete: Triage complete  Chief Complaint Acute respiratory failure with hypoxia (HCC) [J96.01]  [facility].  Usual days 4x per week but dialysis facility cut her back to 3x /wk. Facility staff concerned this is not enough and that pt is in fluid overload. MWF, none missed this week.   EMS exam: Lungs clear, 100% on 5L (2L baseline ber EMS), 174/69, NSR, R forearm IV 20 G,   Pt able to be titrated to home 2L, currently 95% on 2L   Allergies Allergies  Allergen Reactions   Cefepime Other (See Comments)    Feb 2023 Encephalopathy with questionable seizures. Seems to be tolerating cefazolin    Morphine Other (See Comments)    "Dry heaving like crazy"   Sulfa Antibiotics Other (See Comments)    Shut pt's kidneys down    Level of Care/Admitting Diagnosis ED Disposition     ED Disposition  Admit   Condition  --   Comment  Hospital Area: [facility] [[id number]]  Level of Care: Stepdown [14]  Covid Evaluation: Asymptomatic - no recent exposure (last 10 days) testing not required  Diagnosis: Acute respiratory failure with hypoxia [facility]) [[id number]]  Admitting Physician: Lilyan Gilford [[id number]]  Attending Physician: Lilyan Gilford [[id number]]          B Medical/Surgery History Past Medical History:  Diagnosis Date   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 Karilyn Cota.    GERD (gastroesophageal reflux disease)    Glomerulonephritis    Gout    Mixed hyperlipidemia    Spondylosis    Past Surgical History:  Procedure Laterality Date   APPENDECTOMY     AV FISTULA PLACEMENT Left 03/20/2022   Procedure: INSERTION OF LEFT UPPER ARM ARTERIOVENOUS (AV) GORE-TEX GRAFT;  Surgeon: Larina Earthly, MD;  Location: [facility];  Service: Vascular;  Laterality: Left;   CATARACT EXTRACTION     COLONOSCOPY N/A 12/28/2015   Procedure: COLONOSCOPY;  Surgeon: Malissa Hippo, MD;  Location: [facility] END[facility]ITE;  Service: Endoscopy;  Laterality: N/A;  815   ESOPHAGOGASTRODUODENOSCOPY N/A 11/16/2020   Procedure: ESOPHAGOGASTRODUODENOSCOPY (EGD);  Surgeon: Malissa Hippo, MD;  Location: [facility] END[facility];  Service: Endoscopy;  Laterality: N/A;  1:15   EXCHANGE OF A DIALYSIS CATHETER N/A 03/20/2022   Procedure: EXCHANGE OF A DIALYSIS CATHETER;  Surgeon: Larina Earthly, MD;  Location: [facility] ORS;  Service: Vascular;  Laterality: N/A;   INSERTION OF DIALYSIS CATHETER Right 09/30/2021   Procedure: INSERTION OF TUNNELED DIALYSIS CATHETER;  Surgeon: Maeola Harman, MD;  Location: [facility] OR;  Service: Vascular;  Laterality: Right;   IR FLUORO GUIDE CV LINE RIGHT  01/16/2019   IR REMOVAL TUN CV CATH W/O FL  07/01/2019   IR THROMBECTOMY AV FISTULA W/THROMBOLYSIS/PTA INC/SHUNT/IMG LEFT Left 02/01/2023   IR US GUIDE VASC ACCESS LEFT  02/01/2023  IR US GUIDE VASC ACCESS RIGHT  01/16/2019   POLYPECTOMY  11/16/2020   Procedure: POLYPECTOMY;  Surgeon: Malissa Hippo, MD;  Location[facility];  Service: Endoscopy;;  gastric   REMOVAL OF A DIALYSIS CATHETER  09/30/2021   Procedure: REMOVAL OF A PERITONEAL DIALYSIS CATHETER;  Surgeon: Maeola Harman, MD;  Location: [facility] OR;  Service: Vascular;;   REMOVAL OF A DIALYSIS CATHETER N/A 05/22/2022   Procedure: MINOR REMOVAL OF A TUNNELED DIALYSIS CATHETER;  Surgeon: Larina Earthly, MD;  Location: [facility];  Service: Vascular;  Laterality: N/A;   TUBAL LIGATION        A IV Location/Drains/Wounds Patient Lines/Drains/Airways Status     Active Line/Drains/Airways     Name Placement date Placement time Site Days   Peripheral IV 02/05/23 22 G 1" Posterior;Right Forearm 02/05/23  0454  Forearm  5   Fistula / Graft Left Upper arm Arteriovenous vein graft --  --  Upper arm  --   Pressure Injury 04/22/22 Sacrum Right Stage 1 -  Intact skin with non-blanchable redness of a localized area usually over a bony prominence. 04/22/22  2330  -- 294   Wound / Incision (Open or Dehisced) 02/01/23 Arm Anterior;Distal;Left;Upper suture/ guaze tegaderm 02/01/23  1530  Arm  9            Intake/Output Last 24 hours No intake or output data in the 24 hours ending 02/10/23 2222  Labs/Imaging Results for orders placed or performed during the hospital encounter of 02/10/23 (from the past 48 hour(s))  Basic metabolic panel     Status: Abnormal   Collection Time: 02/10/23  7:52 PM  Result Value Ref Range   Sodium 132 (L) 135 - 145 mmol/L   Potassium 3.8 3.5 - 5.1 mmol/L   Chloride 96 (L) 98 - 111 mmol/L   CO2 23 22 - 32 mmol/L   Glucose, Bld 120 (H) 70 - 99 mg/dL    Comment: Glucose reference range applies only to samples taken after fasting for at least 8 hours.   BUN 53 (H) 8 - 23 mg/dL   Creatinine, Ser 4.09 (H) 0.44 - 1.00 mg/dL   Calcium 8.3 (L) 8.9 - 10.3 mg/dL   GFR, Estimated 5 (L) >60 mL/min    Comment: (NOTE) Calculated using the CKD-EPI Creatinine Equation (2021)    Anion gap 13 5 - 15    Comment: Performed at [facility], [address] [place], [place] [address]  CBC     Status: Abnormal   Collection Time: 02/10/23  7:52 PM  Result Value Ref Range   WBC 11.4 (H) 4.0 - 10.5 K/uL   RBC 2.52 (L) 3.87 - 5.11 MIL/uL   Hemoglobin 7.2 (L) 12.0 - 15.0 g/dL   HCT 47.8 (L) 29.5 - 62.1 %   MCV 92.5 80.0 - 100.0 fL   MCH 28.6 26.0 - 34.0 pg   MCHC 30.9 30.0 - 36.0 g/dL   RDW 30.8 (H) 65.7 - 84.6 %   Platelets 327 150 - 400 K/uL   nRBC 0.0 0.0 -  0.2 %    Comment: Performed at [facility], [address], [place], [place] [address]   DG Chest Port 1 View  Result Date: 02/10/2023 CLINICAL DATA:  Needing dialysis, pain. EXAM: PORTABLE CHEST 1 VIEW COMPARISON:  01/28/2023. FINDINGS: The heart is enlarged and the mediastinal contour stable. There is atherosclerotic calcification of the aorta. The pulmonary vasculature is distended. Diffuse interstitial and airspace opacities are  noted in the lungs bilaterally. There is a moderate pleural effusion on the right and small pleural effusion on the left. No acute osseous abnormality. IMPRESSION: 1. Cardiomegaly with pulmonary vascular congestion. 2. Diffuse interstitial and airspace opacities bilaterally, possible edema or infiltrate. 3. Moderate right pleural effusion and small left pleural effusion. Electronically Signed   By: Thornell Sartorius M.D.   On: 02/10/2023 20:13    Pending Labs Unresulted Labs (From admission, onward)     Start     Ordered   02/11/23 0500  Hepatitis B surface antigen  Once,   R        02/11/23 0500   02/11/23 0500  Hepatitis B surface antibody,quantitative  Once,   R        02/11/23 0500   Signed and Held  Renal function panel  Once,   R        Signed and Held   Signed and Held  CBC  Once,   R        Signed and Held   Signed and Held  Comprehensive metabolic panel  Tomorrow morning,   R        Signed and Held   Signed and Held  Magnesium  Tomorrow morning,   R        Signed and Held   Signed and Held  CBC with Differential/Platelet  Tomorrow morning,   R        Signed and Held            Vitals/Pain Today's Vitals   02/10/23 2115 02/10/23 2145 02/10/23 2215 02/10/23 2221  BP: (!) 158/61 (!) 163/64 (!) 163/63   Pulse: 83 84 83   Resp: 20 (!) 31 (!) 22   Temp:    97.9 F (36.6 C)  TempSrc:    Oral  SpO2: 96% 98% 98%   Weight:      PainSc:        Isolation Precautions No active isolations  Medications Medications  Chlorhexidine Gluconate Cloth 2  % PADS 6 each (has no administration in time range)    Mobility walks with device     Focused Assessments Renal Assessment Handoff:  Hemodialysis Schedule: Hemodialysis Schedule: Monday/Wednesday/Friday Last Hemodialysis date and time: Friday   Restricted appendage: left arm   R Recommendations: See Admitting Provider Note   Additional Notes: Good thrill felt L arm graft, A&Ox4, anuria. Back to baseline 2L [place] with 98% on pulse oximeter.

## 2023-02-10 NOTE — ED Provider Notes (Signed)
[place] EMERGENCY DEPARTMENT AT [facility] Provider Note   CSN: [id number] Arrival date & time: 02/10/23  1935     History  Chief Complaint  Patient presents with   Shortness of Breath    Felicia Williams is a 82 y.o. female with a history most significant for end-stage renal disease on dialysis Monday Wednesday Friday, also history of hypertension, CHF, gastroparesis, hypertension presenting for evaluation of increased shortness of breath and feeling that she needs an extra dialysis session.  She had been on 4 times weekly dialysis but her dialysis facility cut her back to 3 times per week but patient feels like this is not enough as she becomes significantly short of breath between dialysis sessions.  She does have home oxygen for as needed use, and is using oxygen currently 2 L and is at 95%, although she does have some tachypnea currently with a rate around 26.  She denies chest pain, abdominal pain or distention, no edema in her legs which she states she never gets.  She is scheduled for dialysis tomorrow around noon but does not feel that she is going to make it that long without at least a short dialysis session.  The history is provided by the patient.       Home Medications Prior to Admission medications   Medication Sig Start Date End Date Taking? Authorizing Provider  acetaminophen (TYLENOL) 325 MG tablet Take 2 tablets (650 mg total) by mouth every 6 (six) hours as needed for mild pain. 09/12/21   Love, Evlyn Kanner, PA-C  carvedilol (COREG) 12.5 MG tablet Take 1 tablet by mouth 2 (two) times daily.    [provider]  famotidine (PEPCID) 40 MG tablet Take 1 tablet (40 mg total) by mouth at bedtime. Patient taking differently: Take 40 mg by mouth as needed for heartburn or indigestion. 11/28/21   Malissa Hippo, MD  hydrALAZINE (APRESOLINE) 50 MG tablet Take 50 mg by mouth 2 (two) times daily. Do not take morning dose on Dialysis days. Monday,Wednesday and  Friday    [provider]  HYDROcodone-acetaminophen (NORCO) 10-325 MG tablet Take 1 tablet by mouth every 6 (six) hours as needed for moderate pain. 02/05/23   Catarina Hartshorn, MD  loperamide (IMODIUM) 2 MG capsule Take 1 capsule (2 mg total) by mouth as needed for diarrhea or loose stools. 09/12/21   Love, Evlyn Kanner, PA-C  midodrine (PROAMATINE) 10 MG tablet Take 1 tablet (10 mg total) by mouth every Monday, Wednesday, and Friday. 10/26/22   Sherryll Burger, Pratik D, DO  multivitamin (RENA-VIT) TABS tablet Take 1 tablet by mouth daily. One daily    [provider]  nitroGLYCERIN (NITROSTAT) 0.4 MG SL tablet Place 0.4 mg under the tongue every 5 (five) minutes as needed for chest pain. 12/28/22   [provider]  Nutritional Supplements (,FEEDING SUPPLEMENT, PROSOURCE PLUS) liquid Take 30 mLs by mouth 2 (two) times daily between meals. 02/05/23   Catarina Hartshorn, MD  pantoprazole (PROTONIX) 40 MG tablet Take 1 tablet (40 mg total) by mouth daily. 02/06/23   Catarina Hartshorn, MD  prochlorperazine (COMPAZINE) 5 MG tablet Take 1 tablet (5 mg total) by mouth 3 (three) times daily before meals. 02/05/23   Catarina Hartshorn, MD  traZODone (DESYREL) 100 MG tablet Take 50 mg by mouth at bedtime. 07/07/22   [provider]      Allergies    Cefepime, Morphine, and Sulfa antibiotics    Review of Systems   Review  of Systems  Constitutional:  Negative for fever.  Eyes: Negative.   Respiratory:  Positive for chest tightness and shortness of breath.   Cardiovascular:  Negative for chest pain and leg swelling.  Gastrointestinal:  Negative for abdominal pain, nausea and vomiting.  Genitourinary: Negative.   Musculoskeletal:  Negative for arthralgias, joint swelling and neck pain.  Skin: Negative.  Negative for rash and wound.  Neurological:  Negative for dizziness, weakness, light-headedness, numbness and headaches.  Psychiatric/Behavioral: Negative.      Physical Exam Updated Vital Signs BP (!) 161/63    Pulse 83   Temp 97.6 F (36.4 C) (Oral)   Resp (!) 21   Wt 46 kg   SpO2 100%   BMI 20.48 kg/m  Physical Exam Vitals and nursing note reviewed.  Constitutional:      Appearance: She is well-developed.  HENT:     Head: Normocephalic and atraumatic.  Eyes:     Conjunctiva/sclera: Conjunctivae normal.  Cardiovascular:     Rate and Rhythm: Normal rate and regular rhythm.     Heart sounds: Normal heart sounds.  Pulmonary:     Effort: Pulmonary effort is normal.     Breath sounds: Examination of the right-middle field reveals rales. Examination of the right-lower field reveals rales. Examination of the left-lower field reveals rales. Rales present. No wheezing.  Abdominal:     General: Bowel sounds are normal.     Palpations: Abdomen is soft.     Tenderness: There is no abdominal tenderness.  Musculoskeletal:        General: Normal range of motion.     Cervical back: Normal range of motion.     Right lower leg: No edema.     Left lower leg: No edema.  Skin:    General: Skin is warm and dry.  Neurological:     Mental Status: She is alert.     ED Results / Procedures / Treatments   Labs (all labs ordered are listed, but only abnormal results are displayed) Labs Reviewed  BASIC METABOLIC PANEL - Abnormal; Notable for the following components:      Result Value   Sodium 132 (*)    Chloride 96 (*)    Glucose, Bld 120 (*)    BUN 53 (*)    Creatinine, Ser 7.02 (*)    Calcium 8.3 (*)    GFR, Estimated 5 (*)    All other components within normal limits  CBC - Abnormal; Notable for the following components:   WBC 11.4 (*)    RBC 2.52 (*)    Hemoglobin 7.2 (*)    HCT 23.3 (*)    RDW 17.8 (*)    All other components within normal limits  HEPATITIS B SURFACE ANTIGEN  HEPATITIS B SURFACE ANTIBODY, QUANTITATIVE    EKG EKG Interpretation Date/Time:  Sunday February 10 2023 19:47:27 EDT Ventricular Rate:  86 PR Interval:  141 QRS Duration:  100 QT Interval:  451 QTC  Calculation: 540 R Axis:   -9  Text Interpretation: Age not entered, assumed to be  82 years old for purpose of ECG interpretation Sinus rhythm Borderline abnrm T, anterolateral leads Prolonged QT interval Confirmed by Benjiman Core [phone]) on 02/10/2023 9:59:32 PM  Radiology DG Chest Port 1 View  Result Date: 02/10/2023 CLINICAL DATA:  Needing dialysis, pain. EXAM: PORTABLE CHEST 1 VIEW COMPARISON:  01/28/2023. FINDINGS: The heart is enlarged and the mediastinal contour stable. There is atherosclerotic calcification of the aorta. The pulmonary vasculature is  distended. Diffuse interstitial and airspace opacities are noted in the lungs bilaterally. There is a moderate pleural effusion on the right and small pleural effusion on the left. No acute osseous abnormality. IMPRESSION: 1. Cardiomegaly with pulmonary vascular congestion. 2. Diffuse interstitial and airspace opacities bilaterally, possible edema or infiltrate. 3. Moderate right pleural effusion and small left pleural effusion. Electronically Signed   By: Thornell Sartorius M.D.   On: 02/10/2023 20:13    Procedures Procedures    Medications Ordered in ED Medications  Chlorhexidine Gluconate Cloth 2 % PADS 6 each (has no administration in time range)    ED Course/ Medical Decision Making/ A&P                             Medical Decision Making Patient is presenting with increased shortness of breath having to escalate oxygen flow to assist with breathing and needed dialysis, she is scheduled for her routine dialysis tomorrow at noon, she feels she is unable to wait till then.  She denies chest pain, no peripheral edema.  Her chest x-ray does reflect moderate right pleural effusion, pulmonary vascular congestion.  She can mostly speak in full sentences on 3 L of oxygen and is maintaining her oxygen saturation around 94 to 95%, 2 L is her baseline.  Amount and/or Complexity of Data Reviewed Labs: ordered.    Details: Significant labs  including a potassium of 3.8.  Other labs are stable with a hemoglobin of 7.2, chronic disease she also has a WBC count 11.4. Radiology: ordered.    Details: Radiology results per above, also there is questionable opacities bilaterally.  She has no history of fever or increased cough, doubt this represents pneumonia Discussion of management or test interpretation with external provider(s): Patient was discussed with Dr. Thedore Mins of nephrology who recommends admission here and they can dialyze her first thing in the morning, renal will consult.  If she declines further tonight she would need to be transferred to [facility] for emergent dialysis.  Discussed with Dr. Carren Rang who accepts pt for admission.           Final Clinical Impression(s) / ED Diagnoses Final diagnoses:  ESRD (end stage renal disease) (HCC)  Shortness of breath    Rx / DC Orders ED Discharge Orders     None         Victoriano Lain 02/10/23 2246    Benjiman Core, MD 02/10/23 [phone]

## 2023-02-11 ENCOUNTER — Observation Stay (HOSPITAL_COMMUNITY): Payer: Medicare Other

## 2023-02-11 DIAGNOSIS — I5030 Unspecified diastolic (congestive) heart failure: Secondary | ICD-10-CM | POA: Diagnosis not present

## 2023-02-11 DIAGNOSIS — K2100 Gastro-esophageal reflux disease with esophagitis, without bleeding: Secondary | ICD-10-CM

## 2023-02-11 DIAGNOSIS — I5033 Acute on chronic diastolic (congestive) heart failure: Secondary | ICD-10-CM

## 2023-02-11 DIAGNOSIS — R0602 Shortness of breath: Secondary | ICD-10-CM

## 2023-02-11 DIAGNOSIS — J9601 Acute respiratory failure with hypoxia: Secondary | ICD-10-CM

## 2023-02-11 DIAGNOSIS — E8779 Other fluid overload: Secondary | ICD-10-CM

## 2023-02-11 DIAGNOSIS — Z992 Dependence on renal dialysis: Secondary | ICD-10-CM

## 2023-02-11 DIAGNOSIS — I10 Essential (primary) hypertension: Secondary | ICD-10-CM

## 2023-02-11 DIAGNOSIS — N189 Chronic kidney disease, unspecified: Secondary | ICD-10-CM

## 2023-02-11 DIAGNOSIS — N186 End stage renal disease: Secondary | ICD-10-CM

## 2023-02-11 LAB — ECHOCARDIOGRAM COMPLETE
AR max vel: 3.14 cm2
AV Area VTI: 3.13 cm2
AV Area mean vel: 3.13 cm2
AV Mean grad: 5 mmHg
AV Peak grad: 10.4 mmHg
Ao pk vel: 1.61 m/s
Area-P 1/2: 4.15 cm2
Calc EF: 37.7 %
Height: 59 in
P 1/2 time: 498 msec
S' Lateral: 3.6 cm
Single Plane A2C EF: 37.8 %
Single Plane A4C EF: 39.9 %
Weight: 1587.31 oz

## 2023-02-11 LAB — COMPREHENSIVE METABOLIC PANEL
ALT: 15 U/L (ref 0–44)
AST: 21 U/L (ref 15–41)
Albumin: 2.1 g/dL — ABNORMAL LOW (ref 3.5–5.0)
Alkaline Phosphatase: 104 U/L (ref 38–126)
Anion gap: 12 (ref 5–15)
BUN: 60 mg/dL — ABNORMAL HIGH (ref 8–23)
CO2: 25 mmol/L (ref 22–32)
Calcium: 8.2 mg/dL — ABNORMAL LOW (ref 8.9–10.3)
Chloride: 95 mmol/L — ABNORMAL LOW (ref 98–111)
Creatinine, Ser: 7.61 mg/dL — ABNORMAL HIGH (ref 0.44–1.00)
GFR, Estimated: 5 mL/min — ABNORMAL LOW (ref >60)
Glucose, Bld: 96 mg/dL (ref 70–99)
Potassium: 4.1 mmol/L (ref 3.5–5.1)
Sodium: 132 mmol/L — ABNORMAL LOW (ref 135–145)
Total Bilirubin: 0.7 mg/dL (ref 0.3–1.2)
Total Protein: 6.2 g/dL — ABNORMAL LOW (ref 6.5–8.1)

## 2023-02-11 LAB — CBC WITH DIFFERENTIAL/PLATELET
Abs Immature Granulocytes: 0.03 10*3/uL (ref 0.00–0.07)
Basophils Absolute: 0.1 10*3/uL (ref 0.0–0.1)
Basophils Relative: 1 %
Eosinophils Absolute: 0.3 10*3/uL (ref 0.0–0.5)
Eosinophils Relative: 3 %
HCT: 21.6 % — ABNORMAL LOW (ref 36.0–46.0)
Hemoglobin: 6.8 g/dL — CL (ref 12.0–15.0)
Immature Granulocytes: 0 %
Lymphocytes Relative: 13 %
Lymphs Abs: 1.2 10*3/uL (ref 0.7–4.0)
MCH: 29.2 pg (ref 26.0–34.0)
MCHC: 31.5 g/dL (ref 30.0–36.0)
MCV: 92.7 fL (ref 80.0–100.0)
Monocytes Absolute: 0.7 10*3/uL (ref 0.1–1.0)
Monocytes Relative: 7 %
Neutro Abs: 7.2 10*3/uL (ref 1.7–7.7)
Neutrophils Relative %: 76 %
Platelets: ADEQUATE 10*3/uL (ref 150–400)
RBC: 2.33 MIL/uL — ABNORMAL LOW (ref 3.87–5.11)
RDW: 18.1 % — ABNORMAL HIGH (ref 11.5–15.5)
WBC: 9.4 10*3/uL (ref 4.0–10.5)
nRBC: 0 % (ref 0.0–0.2)

## 2023-02-11 LAB — TYPE AND SCREEN: ABO/RH(D): A POS

## 2023-02-11 LAB — FERRITIN: Ferritin: 286 ng/mL (ref 11–307)

## 2023-02-11 LAB — ABO/RH: ABO/RH(D): A POS

## 2023-02-11 LAB — BPAM RBC
Blood Product Expiration Date: 202408142359
Unit Type and Rh: 6200

## 2023-02-11 LAB — IRON AND TIBC
Iron: 21 ug/dL — ABNORMAL LOW (ref 28–170)
Saturation Ratios: 18 % (ref 10.4–31.8)
TIBC: 118 ug/dL — ABNORMAL LOW (ref 250–450)
UIBC: 97 ug/dL

## 2023-02-11 LAB — MAGNESIUM: Magnesium: 2.1 mg/dL (ref 1.7–2.4)

## 2023-02-11 LAB — MRSA NEXT GEN BY PCR, NASAL: MRSA by PCR Next Gen: NOT DETECTED

## 2023-02-11 LAB — HEPATITIS B SURFACE ANTIGEN: Hepatitis B Surface Ag: NONREACTIVE

## 2023-02-11 LAB — PREPARE RBC (CROSSMATCH)

## 2023-02-11 MED ORDER — DARBEPOETIN ALFA 300 MCG/0.6ML IJ SOSY
200.0000 ug | PREFILLED_SYRINGE | INTRAMUSCULAR | Status: DC
  Administered 2023-02-11: 200 ug via SUBCUTANEOUS
  Filled 2023-02-11 (×3): qty 0.6

## 2023-02-11 MED ORDER — MIDODRINE HCL 5 MG PO TABS
10.0000 mg | ORAL_TABLET | Freq: Once | ORAL | Status: AC
  Administered 2023-02-11: 10 mg via ORAL
  Filled 2023-02-11: qty 2

## 2023-02-11 MED ORDER — PERFLUTREN LIPID MICROSPHERE
1.0000 mL | INTRAVENOUS | Status: AC | PRN
  Administered 2023-02-11: 2 mL via INTRAVENOUS

## 2023-02-11 MED ORDER — HEPARIN SODIUM (PORCINE) 5000 UNIT/ML IJ SOLN
5000.0000 [IU] | Freq: Three times a day (TID) | INTRAMUSCULAR | Status: DC
  Administered 2023-02-12: 5000 [IU] via SUBCUTANEOUS
  Filled 2023-02-11: qty 1

## 2023-02-11 MED ORDER — SODIUM CHLORIDE 0.9% IV SOLUTION: Freq: Once | INTRAVENOUS | Status: AC

## 2023-02-11 MED ORDER — ENSURE ENLIVE PO LIQD: 237.0000 mL | Freq: Two times a day (BID) | ORAL | Status: DC

## 2023-02-11 MED ORDER — FAMOTIDINE 20 MG PO TABS
20.0000 mg | ORAL_TABLET | Freq: Every day | ORAL | Status: DC
  Administered 2023-02-11: 20 mg via ORAL
  Filled 2023-02-11: qty 1

## 2023-02-11 NOTE — Consult Note (Signed)
Felicia Williams Admit Date: 02/10/2023 02/11/2023 Felicia Williams Requesting Physician:  Flossie Dibble MD  Reason for Consult:  ESRD, Hypoxic RF, SOB, pulm edem HPI:  62F PMH including ESRD on hemodialysis at [facility] MWF using AVG, recent admission 7/8 through 02/05/2023 for sepsis secondary to lobar pneumonia treated with ceftriaxone/azithromycin and transition to Zosyn, chronic hypoxic respiratory failure on [place] chronically, hypertension, HFrEF, GERD, hyperlipidemia who presented to [facility], ED yesterday with progressive dyspnea, orthopnea, exertional symptoms.   In the ED labs notable for K4.1, BUN 60, hemoglobin 6.8 and it was 7.3 upon discharge 1 week ago.  She states rec all HD last week and left Friday at EDW?  No overt blood loss.   BPs have been elevated since admit.  Home meds include carvedilol and midodrine?   pCXR 7/21 indpendently reviewed, diffuse edema and b/l effusions.    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 Karilyn Cota.   GERD (gastroesophageal reflux disease)    Glomerulonephritis    Gout    Mixed hyperlipidemia    Spondylosis    PSH  Past Surgical History:  Procedure Laterality Date   APPENDECTOMY     AV FISTULA PLACEMENT Left 03/20/2022   Procedure: INSERTION OF LEFT UPPER ARM ARTERIOVENOUS (AV) GORE-TEX GRAFT;  Surgeon: Larina Earthly, MD;  Location: [facility] ORS;  Service: Vascular;  Laterality: Left;   CATARACT EXTRACTION     COLONOSCOPY N/A 12/28/2015   Procedure: COLONOSCOPY;  Surgeon: Malissa Hippo, MD;  Location: [facility] END[facility] SUITE;  Service: Endoscopy;  Laterality:  N/A;  815   ESOPHAGOGASTRODUODENOSCOPY N/A 11/16/2020   Procedure: ESOPHAGOGASTRODUODENOSCOPY (EGD);  Surgeon: Malissa Hippo, MD;  Location: [facility] END[facility] SU[facility];  Service: Endoscopy;  Laterality: N/A;  1:15   EXCHANGE OF A DIALYSIS CATHETER N/A 03/20/2022   Procedure: EXCHANGE OF A DIALYSIS CATHETER;  Surgeon: Larina Earthly, MD;  Location: [facility] ORS;  Service: Vascular;  Laterality: N/A;   INSERTION OF DIALYSIS CATHETER Right 09/30/2021   Procedure: INSERTION OF TUNNELED DIALYSIS CATHETER;  Surgeon: Maeola Harman, MD;  Location: [facility] OR;  Service: Vascular;  Laterality: Right;   IR FLUORO GUIDE CV LINE RIGHT  01/16/2019   IR REMOVAL TUN CV CATH W/O FL  07/01/2019   IR THROMBECTOMY AV FISTULA W/THROMBOLYSIS/PTA INC/SHUNT/IMG LEFT Left 02/01/2023   IR US GUIDE VASC ACCESS LEFT  02/01/2023   IR US GUIDE VASC ACCESS RIGHT  01/16/2019   POLYPECTOMY  11/16/2020   Procedure: POLYPECTOMY;  Surgeon: Malissa Hippo, MD;  Location: [facility];  Service: Endoscopy;;  gastric   REMOVAL OF A DIALYSIS CATHETER  09/30/2021   Procedure: REMOVAL OF A PERITONEAL DIALYSIS CATHETER;  Surgeon: Maeola Harman, MD;  Location: [facility] OR;  Service: Vascular;;   REMOVAL OF A DIALYSIS CATHETER N/A 05/22/2022   Procedure: MINOR REMOVAL OF A TUNNELED DIALYSIS CATHETER;  Surgeon: Larina Earthly, MD;  Location: [facility];  Service: Vascular;  Laterality: N/A;   TUBAL LIGATION     FH  Family History  Problem Relation Age of Onset   CAD Father    Heart attack Father    Diabetes Mellitus II Father    Hypertension Father    Lupus Brother    SH  reports that she has never smoked. She has never been exposed to tobacco smoke. She has never used smokeless tobacco. She reports that she does not drink alcohol and does not use drugs. Allergies  Allergies  Allergen Reactions   Cefepime Other (See Comments)    Feb 2023 Encephalopathy with questionable seizures. Seems to be tolerating cefazolin , but the daughter mentioned  not to start her on any cephalosporin     Morphine Other (See Comments)    "Dry heaving like crazy"   Sulfa Antibiotics Other (See Comments)    Shut pt's kidneys down   Home medications Prior to Admission medications   Medication Sig Start Date End Date Taking? Authorizing Provider  acetaminophen (TYLENOL) 325 MG tablet Take 2 tablets (650 mg total) by mouth every 6 (six) hours as needed for mild pain. 09/12/21   Love, Evlyn Kanner, PA-C  carvedilol (COREG) 12.5 MG tablet Take 1 tablet by mouth 2 (two) times daily.    [provider]  famotidine (PEPCID) 40 MG tablet Take 1 tablet (40 mg total) by mouth at bedtime. Patient taking differently: Take 40 mg by mouth as needed for heartburn or indigestion. 11/28/21   Malissa Hippo, MD  hydrALAZINE (APRESOLINE) 50 MG tablet Take 50 mg by mouth 2 (two) times daily. Do not take morning dose on Dialysis days. Monday,Wednesday and Friday    [provider]  HYDROcodone-acetaminophen (NORCO) 10-325 MG tablet Take 1 tablet by mouth every 6 (six) hours as needed for moderate pain. 02/05/23   Catarina Hartshorn, MD  loperamide (IMODIUM) 2 MG capsule Take 1 capsule (2 mg total) by mouth as needed for diarrhea or loose stools. 09/12/21   Love, Evlyn Kanner, PA-C  midodrine (PROAMATINE) 10 MG tablet Take 1 tablet (10 mg total) by mouth every Monday, Wednesday, and Friday. 10/26/22   Sherryll Burger, Pratik D, DO  multivitamin (RENA-VIT) TABS tablet Take 1 tablet by mouth daily. One daily    [provider]  nitroGLYCERIN (NITROSTAT) 0.4 MG SL tablet Place 0.4 mg under the tongue every 5 (five) minutes as needed for chest pain. 12/28/22   [provider]  Nutritional Supplements (,FEEDING SUPPLEMENT, PROSOURCE PLUS) liquid Take 30 mLs by mouth 2 (two) times daily between meals. 02/05/23   Catarina Hartshorn, MD  pantoprazole (PROTONIX) 40 MG tablet Take 1 tablet (40 mg total) by mouth daily. 02/06/23   Catarina Hartshorn, MD  prochlorperazine (COMPAZINE) 5 MG tablet Take 1  tablet (5 mg total) by mouth 3 (three) times daily before meals. 02/05/23   Catarina Hartshorn, MD  traZODone (DESYREL) 100 MG tablet Take 50 mg by mouth at bedtime. 07/07/22   [provider]    Current Medications Scheduled Meds:  carvedilol  12.5 mg Oral BID   Chlorhexidine Gluconate Cloth  6 each Topical Q0600   famotidine  40 mg Oral QHS   feeding supplement  237 mL Oral BID BM   heparin  5,000 Units Subcutaneous Q8H   hydrALAZINE  50 mg Oral BID   multivitamin  1 tablet Oral Daily   pantoprazole  40 mg Oral Daily   Continuous Infusions: PRN Meds:.acetaminophen **OR** acetaminophen, ondansetron **OR** ondansetron (ZOFRAN) IV, oxyCODONE  CBC Recent Labs  Lab 02/04/23 1627 02/10/23 1952 02/11/23 0321  WBC 7.1 11.4*  9.4  NEUTROABS  --   --  7.2  HGB 7.3* 7.2* 6.8*  HCT 23.5* 23.3* 21.6*  MCV 91.4 92.5 92.7  PLT 282 327 PLATELETS APPEAR ADEQUATE   Basic Metabolic Panel Recent Labs  Lab 02/04/23 1627 02/10/23 1952 02/11/23 0321  NA 133* 132* 132*  K 5.1 3.8 4.1  CL 95* 96* 95*  CO2 25 23 25   GLUCOSE 81 120* 96  BUN 46* 53* 60*  CREATININE 6.05* 7.02* 7.61*  CALCIUM 8.5* 8.3* 8.2*  PHOS 8.7*  --   --     Physical Exam  Blood pressure (!) 167/50, pulse 77, temperature 98.6 F (37 C), temperature source Oral, resp. rate 20, height 4\' 11"  (1.499 m), weight 45 kg, SpO2 95%. GEN: chronically ill appearing, mild inc WOB ENT: NCAT EYES: EOMI CV: RRR no rub PULM: diffuse crackles ABD: s/nt/nd SKIN: no rashes EXT:no sig Edema VASCULAR LUE AVG +B/T   Assessment 35F ESRD MWF [name] here with AHRF, dyspnea, volume overload.  Pulmonary Edema, AoC hypoxic RF, volume overload ESRD MWF [name] using AVG prev HD 4x/wk recently back to 3x/wk Anemia, worsened, contributing likely to presenting symptoms HFpEF / diastolic dysfunction CKD BMD, hyerphosphatemia; prev stated can't tolerate any binders, defer to outpt mgmt HTN/Vol, as above; BPs up; likely  contributing to her presenting sympmtoms == HTN emergency  Plan HD this AM, 4L UF goal TRH to transfuse 1u PRBC Check Fe levels, give aranesp today No Binder?  Trend BP with UF,  Is on BB and midodrine.  Not sure how much her BP drops with HD, monitor and adjust.  High afterload is contributing to her symptoms Defer decision of 3x or 4x/wk HD to outpt nephrologist.    Felicia Williams  02/11/2023, 7:56 AM

## 2023-02-11 NOTE — Assessment & Plan Note (Addendum)
-  History of end-stage renal disease on hemodialysis at [facility] MWF using AVG   - Last dialyzed on Friday, it was a full session  - Associated anemia of chronic disease with a hemoglobin of 6.8 - Using 1 units of PRBC with hemodialysis today  - Continue rena-vit - Nephro consulted -Dr. Marisue Humble following Appreciate input and hemodialysis today - Continue to monitor

## 2023-02-11 NOTE — Progress Notes (Signed)
Transported from ICU to room 340 earlier after having dialysis.   PT walked patient in room and patient sat in chair for a little while.  Walked with walker and standby.  Alert and oriented and on 2 liters which is baseline.  Has no edema and does not want compression hose.

## 2023-02-11 NOTE — Hospital Course (Signed)
Felicia Williams is a 60F PMH including ESRD on hemodialysis at [facility] MWF using AVG, recent admission 7/8 through 02/05/2023 for sepsis secondary to lobar pneumonia treated with IV antibiotics, chronic hypoxic respiratory failure on 1-2 L O2 via [place] chronically, HTN. HLD, HFrEF, GERD, presented from rehab with a chief complaint of dyspnea, orthopnea.    In the ED; she is requiring 3 L nasal cannula.  Labs notable for K4.1, BUN 60, hemoglobin 6.8 and it was 7.3 upon discharge 1 week ago.

## 2023-02-11 NOTE — Plan of Care (Signed)
  Problem: Acute Rehab PT Goals(only PT should resolve) Goal: Pt Will Go Supine/Side To Sit Outcome: Progressing Flowsheets (Taken 02/11/2023 1520) Pt will go Supine/Side to Sit: with min guard assist Goal: Patient Will Transfer Sit To/From Stand Outcome: Progressing Flowsheets (Taken 02/11/2023 1520) Patient will transfer sit to/from stand: with min guard assist Goal: Pt Will Transfer Bed To Chair/Chair To Bed Outcome: Progressing Flowsheets (Taken 02/11/2023 1520) Pt will Transfer Bed to Chair/Chair to Bed: min guard assist Goal: Pt Will Ambulate Outcome: Progressing Flowsheets (Taken 02/11/2023 1520) Pt will Ambulate:  25 feet  with min guard assist  [employer] SPT [employer], DPT Program

## 2023-02-11 NOTE — Assessment & Plan Note (Signed)
-  Continue Coreg 

## 2023-02-11 NOTE — Progress Notes (Signed)
PROGRESS NOTE    Patient: Felicia Williams                            PCP: Rebekah Chesterfield, NP                    DOB: 1940-09-07            DOA: 02/10/2023 CBJ:[id number]             DOS: 02/11/2023, 9:09 AM   LOS: 0 days   Date of Service: The patient was seen and examined on 02/11/2023  Subjective:   The patient was seen and examined this morning. Awake alert still complaining of shortness of breath, elevated blood pressure, satting 95% on 3 L of oxygen hemoglobin down to 6.8 Complain of shortness of breath at rest but gets worse with minimal exertion, also complain of severe generalized weaknesses especially in bilateral lower extremities  Brief Narrative:   Felicia Williams is a 82F PMH including ESRD on hemodialysis at [facility] MWF using AVG, recent admission 7/8 through 02/05/2023 for sepsis secondary to lobar pneumonia treated with IV antibiotics, chronic hypoxic respiratory failure on 1-2 L O2 via [place] chronically, HTN. HLD, HFrEF, GERD, presented from rehab with a chief complaint of dyspnea, orthopnea.    In the ED; she is requiring 3 L nasal cannula.  Labs notable for K4.1, BUN 60, hemoglobin 6.8 and it was 7.3 upon discharge 1 week ago.     Assessment & Plan:   Principal Problem:   Acute respiratory failure with hypoxia (HCC) Active Problems:   History of anemia due to chronic kidney disease   ESRD on dialysis [facility])   Essential hypertension, benign   Generalized weakness   Volume overload   GERD (gastroesophageal reflux disease)   (HFpEF) heart failure with preserved ejection fraction (HCC)     Assessment and Plan: * Acute respiratory failure with hypoxia (HCC) - Secondary to volume overload secondary to ESRD - Continue 3 L nasal cannula - Patient is on 1- 2 L nasal cannula at home for the last month - Anticipate improvement after dialysis - Chest x-ray shows cardiomegaly, vascular congestion, pleural effusions - Continue to monitor  History of anemia  due to chronic kidney disease     Latest Ref Rng & Units 02/11/2023    3:21 AM 02/10/2023    7:52 PM 02/04/2023    4:27 PM  CBC  WBC 4.0 - 10.5 K/uL 9.4  11.4  7.1   Hemoglobin 12.0 - 15.0 g/dL 6.8  7.2  7.3   Hematocrit 36.0 - 46.0 % 21.6  23.3  23.5   Platelets 150 - 400 K/uL PLATELETS APPEAR ADEQUATE  327  282    -Anticipating blood transfusion with hemodialysis today  Nephrology recommending 1 unit -Monitoring H&H   ESRD on dialysis (HCC) -History of end-stage renal disease on hemodialysis at [facility] MWF using AVG   - Last dialyzed on Friday, it was a full session  - Associated anemia of chronic disease with a hemoglobin of 6.8 - Using 1 units of PRBC with hemodialysis today  - Continue rena-vit - Nephro consulted -Dr. Marisue Humble following Appreciate input and hemodialysis today - Continue to monitor  Essential hypertension, benign - Continue Coreg  (HFpEF) heart failure with preserved ejection fraction (HCC) - Shortness of breath-volume overloaded - Likely cardiorenal syndrome, - Plan for HD Today - Chest x-ray shows cardiomegaly, vascular congestion, right and  left pleural effusions - Patient complains of orthopnea - She has an increased oxygen requirement from 1-2 currently needing 3 L of oxygen 95% - No peripheral edema - She does have dyspnea - Last echo was in December 23 and showed an ejection fraction of 50-55% with grade 1 diastolic dysfunction - Update Echo;   - Continue beta-blocker - Continue to monitor  GERD (gastroesophageal reflux disease) - Continue Protonix and Pepcid  Volume overload - Secondary to ESRD primarily with CHF (grade 1 diastolic dysfunction ) also contributing - Defer volume management to nephrology  Generalized weakness -Once stable consulting PT for evaluation -Currently patient difficulty with ambulation and carrying her  ADLs    -------------------------------------------------------------------------------------------------------------------------------- Nutritional status:  The patient's BMI is: Body mass index is 20.04 kg/m. I agree with the assessment and plan as outlined    Skin Assessment: I have examined the patient's skin and I agree with the wound assessment as performed by wound care team As outlined belowe: Pressure Injury 04/22/22 Sacrum Right Stage 1 -  Intact skin with non-blanchable redness of a localized area usually over a bony prominence. (Active)  04/22/22 2330  Location: Sacrum  Location Orientation: Right  Staging: Stage 1 -  Intact skin with non-blanchable redness of a localized area usually over a bony prominence.  Wound Description (Comments):   Present on Admission: Yes    ---------------------------------------------------------------------------------------------------------------------------------  DVT prophylaxis:  heparin injection 5,000 Units Start: 02/11/23 0600 SCDs Start: 02/10/23 2358   Code Status:   Code Status: Full Code  Family Communication: No family member present at bedside- attempt will be made to update daily The above findings and plan of care has been discussed with patient (and family)  in detail,  they expressed understanding and agreement of above. -Advance care planning has been discussed.   Admission status:   Status is: Observation The patient remains OBS appropriate and will d/c before 2 midnights.   Disposition: From  - home             Planning for discharge in 1-2 days: to   Procedures:   No admission procedures for hospital encounter.   Antimicrobials:  Anti-infectives (From admission, onward)    None        Medication:   sodium chloride   Intravenous Once   carvedilol  12.5 mg Oral BID   Chlorhexidine Gluconate Cloth  6 each Topical Q0600   darbepoetin (ARANESP) injection - DIALYSIS  200 mcg Subcutaneous Q  Mon-1800   famotidine  40 mg Oral QHS   feeding supplement  237 mL Oral BID BM   heparin  5,000 Units Subcutaneous Q8H   hydrALAZINE  50 mg Oral BID   multivitamin  1 tablet Oral Daily   pantoprazole  40 mg Oral Daily    acetaminophen **OR** acetaminophen, ondansetron **OR** ondansetron (ZOFRAN) IV, oxyCODONE   Objective:   Vitals:   02/11/23 0500 02/11/23 0600 02/11/23 0730 02/11/23 0822  BP: (!) 163/51 (!) 167/50  (!) 193/66  Pulse: 74 77    Resp: (!) 23 20    Temp:   98.1 F (36.7 C)   TempSrc:   Oral   SpO2: 91% 95%    Weight:      Height:       No intake or output data in the 24 hours ending 02/11/23 0909 Filed Weights   02/10/23 1946 02/11/23 0000 02/11/23 0421  Weight: 46 kg 45.7 kg 45 kg     Physical examination:   Constitution:  Alert, cooperative, no distress,  Appears calm and comfortable  Psychiatric:   Normal and stable mood and affect, cognition intact,   HEENT:        Normocephalic, PERRL, otherwise with in Normal limits  Chest:         Chest symmetric Cardio vascular:  S1/S2, RRR, No murmure, No Rubs or Gallops  pulmonary: Clear to auscultation bilaterally, respirations unlabored, negative wheezes / crackles Abdomen: Soft, non-tender, non-distended, bowel sounds,no masses, no organomegaly Muscular skeletal: Limited exam - in bed, able to move all 4 extremities,   Neuro: CNII-XII intact. , normal motor and sensation, reflexes intact  Extremities: No pitting edema lower extremities, +2 pulses  Skin: Dry, warm to touch, negative for any Rashes, No open wounds Wounds: per nursing documentation   ------------------------------------------------------------------------------------------------------------------------------------------    LABs:     Latest Ref Rng & Units 02/11/2023    3:21 AM 02/10/2023    7:52 PM 02/04/2023    4:27 PM  CBC  WBC 4.0 - 10.5 K/uL 9.4  11.4  7.1   Hemoglobin 12.0 - 15.0 g/dL 6.8  7.2  7.3   Hematocrit 36.0 - 46.0 % 21.6   23.3  23.5   Platelets 150 - 400 K/uL PLATELETS APPEAR ADEQUATE  327  282       Latest Ref Rng & Units 02/11/2023    3:21 AM 02/10/2023    7:52 PM 02/04/2023    4:27 PM  CMP  Glucose 70 - 99 mg/dL 96  829  81   BUN 8 - 23 mg/dL 60  53  46   Creatinine 0.44 - 1.00 mg/dL 5.62  1.30  8.65   Sodium 135 - 145 mmol/L 132  132  133   Potassium 3.5 - 5.1 mmol/L 4.1  3.8  5.1   Chloride 98 - 111 mmol/L 95  96  95   CO2 22 - 32 mmol/L 25  23  25    Calcium 8.9 - 10.3 mg/dL 8.2  8.3  8.5   Total Protein 6.5 - 8.1 g/dL 6.2     Total Bilirubin 0.3 - 1.2 mg/dL 0.7     Alkaline Phos 38 - 126 U/L 104     AST 15 - 41 U/L 21     ALT 0 - 44 U/L 15          Micro Results Recent Results (from the past 240 hour(s))  MRSA Next Gen by PCR, Nasal     Status: None   Collection Time: 02/02/23  9:25 AM   Specimen: Nasal Mucosa; Nasal Swab  Result Value Ref Range Status   MRSA by PCR Next Gen NOT DETECTED NOT DETECTED Final    Comment: (NOTE) The GeneXpert MRSA Assay (FDA approved for NASAL specimens only), is one component of a comprehensive MRSA colonization surveillance program. It is not intended to diagnose MRSA infection nor to guide or monitor treatment for MRSA infections. Test performance is not FDA approved in patients less than 96 years old. Performed at [facility], [address] [place], [place] [address]   Respiratory (~20 pathogens) panel by PCR     Status: None   Collection Time: 02/02/23  9:28 AM   Specimen: Nasopharyngeal Swab; Respiratory  Result Value Ref Range Status   Adenovirus NOT DETECTED NOT DETECTED Final   Coronavirus 229E NOT DETECTED NOT DETECTED Final    Comment: (NOTE) The Coronavirus on the Respiratory Panel, DOES NOT test for the novel  Coronavirus (2019 nCoV)  Coronavirus HKU1 NOT DETECTED NOT DETECTED Final   Coronavirus NL63 NOT DETECTED NOT DETECTED Final   Coronavirus OC43 NOT DETECTED NOT DETECTED Final   Metapneumovirus NOT DETECTED NOT DETECTED  Final   Rhinovirus / Enterovirus NOT DETECTED NOT DETECTED Final   Influenza A NOT DETECTED NOT DETECTED Final   Influenza B NOT DETECTED NOT DETECTED Final   Parainfluenza Virus 1 NOT DETECTED NOT DETECTED Final   Parainfluenza Virus 2 NOT DETECTED NOT DETECTED Final   Parainfluenza Virus 3 NOT DETECTED NOT DETECTED Final   Parainfluenza Virus 4 NOT DETECTED NOT DETECTED Final   Respiratory Syncytial Virus NOT DETECTED NOT DETECTED Final   Bordetella pertussis NOT DETECTED NOT DETECTED Final   Bordetella Parapertussis NOT DETECTED NOT DETECTED Final   Chlamydophila pneumoniae NOT DETECTED NOT DETECTED Final   Mycoplasma pneumoniae NOT DETECTED NOT DETECTED Final    Comment: Performed at [facility], [address] [place], [place] [address]  MRSA Next Gen by PCR, Nasal     Status: None   Collection Time: 02/10/23 11:55 PM   Specimen: Nasal Mucosa; Nasal Swab  Result Value Ref Range Status   MRSA by PCR Next Gen NOT DETECTED NOT DETECTED Final    Comment: (NOTE) The GeneXpert MRSA Assay (FDA approved for NASAL specimens only), is one component of a comprehensive MRSA colonization surveillance program. It is not intended to diagnose MRSA infection nor to guide or monitor treatment for MRSA infections. Test performance is not FDA approved in patients less than 60 years old. Performed at [facility], [address] [place], [place] [address]     Radiology Reports DG Chest [place] 1 View  Result Date: 02/10/2023 CLINICAL DATA:  Needing dialysis, pain. EXAM: PORTABLE CHEST 1 VIEW COMPARISON:  01/28/2023. FINDINGS: The heart is enlarged and the mediastinal contour stable. There is atherosclerotic calcification of the aorta. The pulmonary vasculature is distended. Diffuse interstitial and airspace opacities are noted in the lungs bilaterally. There is a moderate pleural effusion on the right and small pleural effusion on the left. No acute osseous abnormality. IMPRESSION: 1.  Cardiomegaly with pulmonary vascular congestion. 2. Diffuse interstitial and airspace opacities bilaterally, possible edema or infiltrate. 3. Moderate right pleural effusion and small left pleural effusion. Electronically Signed   By: Thornell Sartorius M.D.   On: 02/10/2023 20:13    SIGNED: Kendell Bane, MD, FHM. FAAFP. [facility] Gainer - [facility] Time spent - 55 min of critical time was spent  in seeing, evaluating and examining the patient. Reviewing medical records, labs, drawn plan of care. [facility],  Pager (please use [email or web address] to page/ text) Please use Epic Secure Chat for non-urgent communication (7AM-7PM)  If 7PM-7AM, please contact night-coverage [email or web address], 02/11/2023, 9:09 AM

## 2023-02-11 NOTE — Assessment & Plan Note (Signed)
-  Once stable consulting PT for evaluation -Currently patient difficulty with ambulation and carrying her ADLs

## 2023-02-11 NOTE — Assessment & Plan Note (Addendum)
     Latest Ref Rng & Units 02/11/2023    3:21 AM 02/10/2023    7:52 PM 02/04/2023    4:27 PM  CBC  WBC 4.0 - 10.5 K/uL 9.4  11.4  7.1   Hemoglobin 12.0 - 15.0 g/dL 6.8  7.2  7.3   Hematocrit 36.0 - 46.0 % 21.6  23.3  23.5   Platelets 150 - 400 K/uL PLATELETS APPEAR ADEQUATE  327  282    -Anticipating blood transfusion with hemodialysis today  Nephrology recommending 1 unit -Monitoring H&H

## 2023-02-11 NOTE — Progress Notes (Signed)
  Echocardiogram 2D Echocardiogram has been performed.  Janalyn Harder 02/11/2023, 10:35 AM

## 2023-02-11 NOTE — Assessment & Plan Note (Signed)
--  Continue Protonix and Pepcid 

## 2023-02-11 NOTE — Progress Notes (Signed)
CRITICAL VALUE STICKER  CRITICAL VALUE: Hemoglobin 6.8  RECEIVER (on-site recipient of call): Lockie Mola, RN  MD NOTIFIED: Dr. Mitchell Heir   TIME OF NOTIFICATION: [id number]  RESPONSE: awaiting

## 2023-02-11 NOTE — Evaluation (Addendum)
Physical Therapy Evaluation Patient Details Name: [name] MRN: [id number] DOB: 1941/03/31 Today's Date: 02/11/2023  History of Present Illness  Viriginia Williams is a 82 y.o. female with medical history significant of CHF, anemia, ESRD on HD, hypertension, GERD, mixed hyperlipidemia, and more presents the ED with a chief complaint of dyspnea.  Patient reports that it woke her up out of sleep at 5 AM on the day of presentation.  She has not been able to lay flat.  She denies any chest pain, peripheral edema.  She does report a nonproductive cough it is not necessarily new.  Patient reports she really feels like her dialysis just needs to get started.  Her last dialysis session was on July 19.  It was a full session.  Patient reports that in the past she has had trouble keeping up with her fluid.  For about a 4-5-week.  In the spring they were doing her dialysis 4 times per week.  As her fluid became better controlled they advised that she could drop back down to 3 times per week.  Patient reports that she sometimes gets behind.  She has no urine output.  Patient wears 1-2 L nasal cannula at baseline.  She only been wearing this for about a month.  Today she is requiring 3 L nasal cannula.  Patient's dyspnea is worse with exertion.  She has no other complaints at this time.   Clinical Impression  Pt tolerated treatment session well. Required min assist to transfer from bed to chair and ambulate 10 feet with RW. Pt activity limited due to fatigue and LE weakness. 1-2 L of O2 was required during ambulation. Pt tolerated sitting up in chair at end of therapy session. Patient will benefit from continued skilled physical therapy in hospital and recommended venue below to increase strength, balance, endurance for safe ADLs and gait.         Assistance Recommended at Discharge Intermittent Supervision/Assistance  If plan is discharge home, recommend the following:  Can travel by private vehicle  A  little help with walking and/or transfers;A little help with bathing/dressing/bathroom;Assistance with cooking/housework;Help with stairs or ramp for entrance        Equipment Recommendations Rolling walker (2 wheels)  Recommendations for Other Services       Functional Status Assessment Patient has had a recent decline in their functional status and demonstrates the ability to make significant improvements in function in a reasonable and predictable amount of time.     Precautions / Restrictions Precautions Precautions: Fall Restrictions Weight Bearing Restrictions: No      Mobility  Bed Mobility Overal bed mobility: Needs Assistance Bed Mobility: Supine to Sit, Sit to Supine     Supine to sit: Min assist Sit to supine: Modified independent (Device/Increase time)   General bed mobility comments: assist to pull to seated EOB    Transfers Overall transfer level: Needs assistance Equipment used: Rolling walker (2 wheels) Transfers: Sit to/from Stand Sit to Stand: Min assist   Step pivot transfers: Min assist, Min guard       General transfer comment: assist to power up to standing due to LE weakness    Ambulation/Gait Ambulation/Gait assistance: Min guard, Min assist Gait Distance (Feet): 10 Feet (ambulated from chair to door) Assistive device: Rolling walker (2 wheels) Gait Pattern/deviations: Decreased step length - right, Decreased step length - left, Decreased stride length Gait velocity: decreased     General Gait Details: slightly labored cadence requiring use of RW, requires  assist for RW management  Stairs            Wheelchair Mobility     Tilt Bed    Modified Rankin (Stroke Patients Only)       Balance Overall balance assessment: Needs assistance Sitting-balance support: Feet supported, No upper extremity supported Sitting balance-Leahy Scale: Good Sitting balance - Comments: seated at EOB   Standing balance support: During  functional activity, No upper extremity supported Standing balance-Leahy Scale: Fair Standing balance comment: fair using RW                             Pertinent Vitals/Pain Pain Assessment Pain Assessment: No/denies pain    Home Living Family/patient expects to be discharged to:: Skilled nursing facility Living Arrangements: Alone Available Help at Discharge: Family;Available 24 hours/day Type of Home: House Home Access: Stairs to enter Entrance Stairs-Rails: None Entrance Stairs-Number of Steps: 1   Home Layout: Able to live on main level with bedroom/bathroom;Laundry or work area in [employer] Equipment: Gilmer Mor - single Librarian, academic (2 wheels);Wheelchair - manual;Grab bars - tub/shower;Grab bars - toilet;Toilet riser      Prior Function Prior Level of Function : Independent/Modified Independent;Driving             Mobility Comments: Short distance community ambulator w/o AD, drives ADLs Comments: Independent, PRN assistance from family     Hand Dominance        Extremity/Trunk Assessment                Communication   Communication: No difficulties  Cognition Arousal/Alertness: Awake/alert Behavior During Therapy: WFL for tasks assessed/performed Overall Cognitive Status: Within Functional Limits for tasks assessed                                          General Comments      Exercises     Assessment/Plan    PT Assessment Patient needs continued PT services  PT Problem List Decreased strength;Decreased activity tolerance;Decreased balance;Decreased mobility       PT Treatment Interventions DME instruction;Gait training;Stair training;Functional mobility training;Therapeutic activities;Therapeutic exercise;Patient/family education;Balance training    PT Goals (Current goals can be found in the Care Plan section)  Acute Rehab PT Goals Patient Stated Goal: return home with family to assist PT Goal  Formulation: With patient Time For Goal Achievement: 02/25/23 Potential to Achieve Goals: Good    Frequency Min 3X/week     Co-evaluation               AM-PAC PT "6 Clicks" Mobility  Outcome Measure Help needed turning from your back to your side while in a flat bed without using bedrails?: None Help needed moving from lying on your back to sitting on the side of a flat bed without using bedrails?: A Little Help needed moving to and from a bed to a chair (including a wheelchair)?: A Little Help needed standing up from a chair using your arms (e.g., wheelchair or bedside chair)?: A Little Help needed to walk in hospital room?: A Little Help needed climbing 3-5 steps with a railing? : A Lot 6 Click Score: 18    End of Session Equipment Utilized During Treatment: Oxygen;Gait belt Activity Tolerance: Patient tolerated treatment well;Patient limited by fatigue Patient left: in bed;with call bell/phone within reach Nurse Communication: Mobility status PT Visit  Diagnosis: Unsteadiness on feet (R26.81);Other abnormalities of gait and mobility (R26.89);Muscle weakness (generalized) (M62.81)    Time: 1610-9604 PT Time Calculation (min) (ACUTE ONLY): 20 min   Charges:   PT Evaluation $PT Eval Moderate Complexity: 1 Mod PT Treatments $Therapeutic Activity: 8-22 mins PT General Charges $$ ACUTE PT VISIT: 1 Visit        Juanetta Negash SPT [place], DPT Program

## 2023-02-11 NOTE — TOC Initial Note (Signed)
Transition of Care [facility]) - Initial/Assessment Note    Patient Details  Name: Felicia Williams MRN: [id number] Date of Birth: 1941-03-04  [facility] CM/SW Contact:    Leitha Bleak, RN Phone Number: 02/11/2023, 10:12 AM  Clinical Narrative:         Patient is from [facility], admitted there last week for Rehab. Planning for patient to have HD here x2 day and return to SNF.  Per [name] patient will need PT eval and AUTH to return. MD/PT updated. TOC following.          Expected Discharge Plan: Skilled Nursing Facility Barriers to Discharge: Continued Medical Work up  Patient Goals and CMS Choice    Expected Discharge Plan and Services   [facility]    Prior Living Arrangements/Services    Patient language and need for interpreter reviewed:: Yes        Need for Family Participation in Patient Care: Yes (Comment) Care giver support system in place?: Yes (comment)   Criminal Activity/Legal Involvement Pertinent to Current Situation/Hospitalization: No - Comment as needed  Activities of Daily Living Home Assistive Devices/Equipment: Cane (specify quad or straight), Walker (specify type) ADL Screening (condition at time of admission) Patient's cognitive ability adequate to safely complete daily activities?: Yes Is the patient deaf or have difficulty hearing?: No Does the patient have difficulty seeing, even when wearing glasses/contacts?: No Does the patient have difficulty concentrating, remembering, or making decisions?: No Patient able to express need for assistance with ADLs?: Yes Does the patient have difficulty dressing or bathing?: Yes Independently performs ADLs?: Yes (appropriate for developmental age) Does the patient have difficulty walking or climbing stairs?: Yes Weakness of Legs: Both Weakness of Arms/Hands: None  Permission Sought/Granted      Emotional Assessment     Affect (typically observed): Accepting Orientation: : Oriented to Self, Oriented to  Place, Oriented to  Time, Oriented to Situation Alcohol / Substance Use: Not Applicable Psych Involvement: No (comment)  Admission diagnosis:  Shortness of breath [R06.02] 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] Acute respiratory failure with hypoxia (HCC) [J96.01] Patient Active Problem List   Diagnosis Date Noted   History of anemia due to chronic kidney disease 02/11/2023   Lobar pneumonia (HCC) 02/02/2023   Sepsis due to undetermined organism (HCC) 01/30/2023   CAP (community acquired pneumonia) 01/29/2023   Prolonged QT interval 01/29/2023   (HFpEF) heart failure with preserved ejection fraction (HCC) 01/08/2023   Acute hypoxic respiratory failure (HCC) 10/25/2022   Acute hypoxemic respiratory failure (HCC) 10/14/2022   Nausea and vomiting 10/14/2022   Abnormal stress test 08/16/2022   Chronic diarrhea 08/09/2022   GERD (gastroesophageal reflux disease) 07/12/2022   Hypertensive crisis 07/12/2022   Acute respiratory failure with hypoxia (HCC) 04/25/2022   Acute pulmonary edema (HCC)    Pressure injury of skin 04/23/2022   Volume overload 04/22/2022   Elevated troponin 04/22/2022   Hyperkalemia 02/28/2022   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   Anorexia 11/28/2021   Weight loss 11/28/2021   Malnutrition of moderate degree 09/10/2021   Generalized weakness 09/07/2021   Debility 09/07/2021   Change in bowel movement 09/06/2021   Acute metabolic encephalopathy 08/30/2021   Hypokalemia 08/30/2021   Pruritus 08/28/2021   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   Age-related osteoporosis without current pathological fracture 08/26/2021   Body mass index (BMI) 22.0-22.9, adult 08/26/2021   Iron deficiency anemia 08/26/2021   Localized, 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   Overactive bladder 08/26/2021   Sciatica 08/26/2021  Vitamin D deficiency 08/26/2021   Panic disorder 08/26/2021   Gastroparesis 05/23/2021   Chronic cough 05/23/2021   Renal failure 01/15/2019    ARF (acute renal failure) (HCC) 01/07/2019   Glomerulonephritis 01/07/2019   Psoriasis 01/07/2019   Tachycardia, unspecified 01/07/2019   Macrocytic anemia 08/18/2018   Proteinuria 09/04/2017   Essential hypertension, benign 01/26/2014   Fatigue due to depression 01/26/2014   Gout 07/30/2012   Mixed hyperlipidemia 10/01/2011   Depression 10/01/2011   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   PCP:  Rebekah Chesterfield, NP Pharmacy:   [facility] [address], [place] - [id number] [place] [address] Phone: [phone] Fax: [phone]     Social Determinants of Health (SDOH) Social History: SDOH Screenings   Food Insecurity: No Food Insecurity (02/11/2023)  Housing: Patient Declined (02/11/2023)  Transportation Needs: No Transportation Needs (02/11/2023)  Utilities: Not At Risk (02/11/2023)  Tobacco Use: Low Risk  (02/10/2023)   SDOH Interventions:    Readmission Risk Interventions    01/31/2023    9:54 AM 10/25/2022    8:06 AM 10/15/2022   12:28 PM  Readmission Risk Prevention Plan  Transportation Screening Complete Complete Complete  PCP or Specialist Appt within 3-5 Days   Complete  HRI or Home Care Consult Complete Complete Complete  Social Work Consult for Recovery Care Planning/Counseling Complete Complete Complete  Palliative Care Screening Not Applicable Not Applicable Not Applicable  Medication Review Oceanographer) Complete Complete Complete

## 2023-02-11 NOTE — Assessment & Plan Note (Signed)
-   Secondary to ESRD primarily with CHF (grade 1 diastolic dysfunction ) also contributing - Defer volume management to nephrology

## 2023-02-11 NOTE — Assessment & Plan Note (Addendum)
-   Secondary to volume overload secondary to ESRD - Continue 3 L nasal cannula - Patient is on 1- 2 L nasal cannula at home for the last month - Anticipate improvement after dialysis - Chest x-ray shows cardiomegaly, vascular congestion, pleural effusions - Continue to monitor

## 2023-02-11 NOTE — Progress Notes (Signed)
   02/11/23 1310  Vitals  Temp 98 F (36.7 C)  Temp Source Oral  BP (!) 158/38  MAP (mmHg) 85  BP Location Right Arm  BP Method Automatic  Patient Position (if appropriate) Lying  During Treatment Monitoring  Intra-Hemodialysis Comments Tx completed  Post Treatment  Dialyzer Clearance Lightly streaked  Duration of HD Treatment -hour(s) 3.5 hour(s)  Hemodialysis Intake (mL) 315 mL  Liters Processed 80  Fluid Removed (mL) 3000 mL  Tolerated HD Treatment Yes  Post-Hemodialysis Comments Pt goal met. 1 units PRBC given no complications.  AVG/AVF Arterial Site Held (minutes) 10 minutes  AVG/AVF Venous Site Held (minutes) 10 minutes  Fistula / Graft Left Upper arm Arteriovenous vein graft  No placement date or time found.   Placed prior to admission: Yes  Orientation: Left  Access Location: Upper arm  Access Type: Arteriovenous vein graft  Site Condition No complications  Fistula / Graft Assessment Present;Thrill;Bruit  Status Deaccessed  Needle Size 15  Drainage Description None

## 2023-02-11 NOTE — H&P (Signed)
History and Physical    Patient: Felicia Williams WGN:[id number] DOB: 1940/08/23 DOA: 02/10/2023 DOS: the patient was seen and examined on 02/11/2023 PCP: Rebekah Chesterfield, NP  Patient coming from: ALF/ILF from [facility] rehab  Chief Complaint:  Chief Complaint  Patient presents with   Shortness of Breath   HPI: Felicia Williams is a 82 y.o. female with medical history significant of CHF, anemia, ESRD on HD, hypertension, GERD, mixed hyperlipidemia, and more presents the ED with a chief complaint of dyspnea.  Patient reports that it woke her up out of sleep at 5 AM on the day of presentation.  She has not been able to lay flat.  She denies any chest pain, peripheral edema.  She does report a nonproductive cough it is not necessarily new.  Patient reports she really feels like her dialysis just needs to get started.  Her last dialysis session was on July 19.  It was a full session.  Patient reports that in the past she has had trouble keeping up with her fluid.  For about a 4-5-week.  In the spring they were doing her dialysis 4 times per week.  As her fluid became better controlled they advised that she could drop back down to 3 times per week.  Patient reports that she sometimes gets behind.  She has no urine output.  Patient wears 1-2 L nasal cannula at baseline.  She only been wearing this for about a month.  Today she is requiring 3 L nasal cannula.  Patient's dyspnea is worse with exertion.  She has no other complaints at this time.  Patient does not smoke and does not drink.  She did have a COVID-vaccine, but had a really bad reaction to it so she is not having any more.  Patient is full code. Review of Systems: As mentioned in the history of present illness. All other systems reviewed and are negative. 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 Karilyn Cota.   GERD (gastroesophageal reflux disease)    Glomerulonephritis    Gout    Mixed hyperlipidemia    Spondylosis    Past Surgical History:  Procedure Laterality Date   APPENDECTOMY     AV FISTULA PLACEMENT Left 03/20/2022   Procedure: INSERTION OF LEFT UPPER ARM ARTERIOVENOUS (AV) GORE-TEX GRAFT;  Surgeon: Larina Earthly, MD;  Location: [facility];  Service: Vascular;  Laterality: Left;   CATARACT EXTRACTION     COLONOSCOPY N/A 12/28/2015   Procedure: COLONOSCOPY;  Surgeon: Malissa Hippo, MD;  Location: [facility];  Service: Endoscopy;  Laterality: N/A;  815   ESOPHAGOGASTRODUODENOSCOPY N/A 11/16/2020   Procedure: ESOPHAGOGASTRODUODENOSCOPY (EGD);  Surgeon: Malissa Hippo, MD;  Location: [facility];  Service: Endoscopy;  Laterality: N/A;  1:15   EXCHANGE OF A DIALYSIS CATHETER N/A 03/20/2022   Procedure: EXCHANGE OF A DIALYSIS CATHETER;  Surgeon: Larina Earthly, MD;  Location: [facility];  Service: Vascular;  Laterality: N/A;   INSERTION OF DIALYSIS CATHETER Right 09/30/2021   Procedure: INSERTION OF TUNNELED DIALYSIS CATHETER;  Surgeon: Maeola Harman, MD;  Location: [facility] OR;  Service: Vascular;  Laterality: Right;  IR FLUORO GUIDE CV LINE RIGHT  01/16/2019   IR REMOVAL TUN CV CATH W/O FL  07/01/2019   IR THROMBECTOMY AV FISTULA W/THROMBOLYSIS/PTA INC/SHUNT/IMG LEFT Left 02/01/2023   IR US GUIDE VASC ACCESS LEFT  02/01/2023   IR US GUIDE VASC ACCESS RIGHT  01/16/2019   POLYPECTOMY  11/16/2020   Procedure: POLYPECTOMY;  Surgeon: Malissa Hippo, MD;  Location: [facility];  Service: Endoscopy;;  gastric   REMOVAL OF A DIALYSIS CATHETER  09/30/2021   Procedure: REMOVAL OF A PERITONEAL DIALYSIS CATHETER;  Surgeon: Maeola Harman, MD;  Location: [facility] OR;  Service: Vascular;;   REMOVAL OF A DIALYSIS CATHETER N/A 05/22/2022    Procedure: MINOR REMOVAL OF A TUNNELED DIALYSIS CATHETER;  Surgeon: Larina Earthly, MD;  Location: [facility];  Service: Vascular;  Laterality: N/A;   TUBAL LIGATION     Social History:  reports that she has never smoked. She has never been exposed to tobacco smoke. She has never used smokeless tobacco. She reports that she does not drink alcohol and does not use drugs.  Allergies  Allergen Reactions   Cefepime Other (See Comments)    Feb 2023 Encephalopathy with questionable seizures. Seems to be tolerating cefazolin , but the daughter mentioned not to start her on any cephalosporin     Morphine Other (See Comments)    "Dry heaving like crazy"   Sulfa Antibiotics Other (See Comments)    Shut pt's kidneys down    Family History  Problem Relation Age of Onset   CAD Father    Heart attack Father    Diabetes Mellitus II Father    Hypertension Father    Lupus Brother     Prior to Admission medications   Medication Sig Start Date End Date Taking? Authorizing Provider  acetaminophen (TYLENOL) 325 MG tablet Take 2 tablets (650 mg total) by mouth every 6 (six) hours as needed for mild pain. 09/12/21   Love, Evlyn Kanner, PA-C  carvedilol (COREG) 12.5 MG tablet Take 1 tablet by mouth 2 (two) times daily.    [provider]  famotidine (PEPCID) 40 MG tablet Take 1 tablet (40 mg total) by mouth at bedtime. Patient taking differently: Take 40 mg by mouth as needed for heartburn or indigestion. 11/28/21   Malissa Hippo, MD  hydrALAZINE (APRESOLINE) 50 MG tablet Take 50 mg by mouth 2 (two) times daily. Do not take morning dose on Dialysis days. Monday,Wednesday and Friday    [provider]  HYDROcodone-acetaminophen (NORCO) 10-325 MG tablet Take 1 tablet by mouth every 6 (six) hours as needed for moderate pain. 02/05/23   Catarina Hartshorn, MD  loperamide (IMODIUM) 2 MG capsule Take 1 capsule (2 mg total) by mouth as needed for diarrhea or loose stools. 09/12/21   Love, Evlyn Kanner, PA-C  midodrine  (PROAMATINE) 10 MG tablet Take 1 tablet (10 mg total) by mouth every Monday, Wednesday, and Friday. 10/26/22   Sherryll Burger, Pratik D, DO  multivitamin (RENA-VIT) TABS tablet Take 1 tablet by mouth daily. One daily    [provider]  nitroGLYCERIN (NITROSTAT) 0.4 MG SL tablet Place 0.4 mg under the tongue every 5 (five) minutes as needed for chest pain. 12/28/22   [provider]  Nutritional Supplements (,FEEDING SUPPLEMENT, PROSOURCE PLUS) liquid Take 30 mLs by mouth 2 (two) times daily between meals. 02/05/23   Catarina Hartshorn, MD  pantoprazole (PROTONIX) 40 MG tablet Take 1 tablet (40 mg total) by mouth daily. 02/06/23  Catarina Hartshorn, MD  prochlorperazine (COMPAZINE) 5 MG tablet Take 1 tablet (5 mg total) by mouth 3 (three) times daily before meals. 02/05/23   Catarina Hartshorn, MD  traZODone (DESYREL) 100 MG tablet Take 50 mg by mouth at bedtime. 07/07/22   [provider]    Physical Exam: Vitals:   02/11/23 0200 02/11/23 0300 02/11/23 0400 02/11/23 0421  BP: (!) 155/41 (!) 153/53 (!) 174/77   Pulse: 75 72 78   Resp: (!) 22 18 (!) 26   Temp:    98.6 F (37 C)  TempSrc:    Oral  SpO2: 97% 97% 95%   Weight:    45 kg  Height:       1.  General: Patient lying supine in bed,  no acute distress   2. Psychiatric: Alert and oriented x 3, mood and behavior normal for situation, pleasant and cooperative with exam   3. Neurologic: Speech and language are normal, face is symmetric, moves all 4 extremities voluntarily, at baseline without acute deficits on limited exam   4. HEENMT:  Head is atraumatic, normocephalic, pupils reactive to light, neck is supple, trachea is midline, mucous membranes are moist   5. Respiratory : Diminished in the lower lung fields lungs are clear to auscultation bilaterally without wheezing, rhonchi, rales, no cyanosis, slightly increased work of breathing  6. Cardiovascular : Heart rate normal, rhythm is regular, no murmurs, rubs or gallops, no peripheral  edema, peripheral pulses palpated   7. Gastrointestinal:  Abdomen is soft, nondistended, nontender to palpation bowel sounds active, no masses or organomegaly palpated   8. Skin:  Skin is warm, dry and intact without rashes, acute lesions, or ulcers on limited exam   9.Musculoskeletal:  No acute deformities or trauma, no asymmetry in tone, no peripheral edema, peripheral pulses palpated, no tenderness to palpation in the extremities  Data Reviewed: In the ED Temp 97.6, heart rate 83-85, respiratory rate 21-26, blood pressure 161/63-174/69, satting at 100% on 3 L Leukocytosis 11.4, hemoglobin 7.2 Repeat hemoglobin down to 6.8, type and screen patient and defer to nephro so that she can get transfusion during dialysis Patient was slightly hyponatremic at 132, elevated BUN of 53, elevated creatinine 7.02 Chest x-ray shows cardiomegaly with vascular congestion, edema, and pleural effusions Nephrology plans to dialyze patient in the a.m. Admission requested for volume overload with hypoxia  Assessment and Plan: * Acute respiratory failure with hypoxia (HCC) - Secondary to volume overload secondary to ESRD - Continue 3 L nasal cannula - Patient is on 2 L nasal cannula at home for the last month - Anticipate improvement after dialysis - Chest x-ray shows cardiomegaly, vascular congestion, pleural effusions - Continue to monitor  ESRD on dialysis [facility]) - Monday Wednesday Friday hemodialysis - Last dialyzed on Friday, it was a full session - Defer electrolyte and fluid management to nephrology - Associated anemia of chronic disease with a hemoglobin of 6.8 - Defer transfusion to nephro so that she can get it with dialysis - Continue rena-vit - Nephro consulted from the ER and plans on dialysis in the a.m. - Given patient's shortness of breath and tachypnea in the ER she has been placed in stepdown in case she should need BiPAP in the interim - Continue to monitor  Essential  hypertension, benign - Continue Coreg  (HFpEF) heart failure with preserved ejection fraction (HCC)  - Cardiorenal syndrome, currently volume overloaded - Plan for HD in the a.m. - Chest x-ray shows cardiomegaly, vascular congestion, right  and left pleural effusions - Patient complains of orthopnea - She has an increased oxygen requirement - No peripheral edema - She does have dyspnea - Last echo was in December 23 and showed an ejection fraction of 50-55% with grade 1 diastolic dysfunction - Update echo - Defer fluid management to nephro - Continue beta-blocker - Continue to monitor  GERD (gastroesophageal reflux disease) - Continue Protonix and Pepcid  Volume overload - Secondary to ESRD primarily with CHF (grade 1 diastolic dysfunction ) also contributing - Defer volume management to nephrology      Advance Care Planning:   Code Status: Full Code  Consults: Nephrology  Family Communication: No family at bedside  Severity of Illness: The appropriate patient status for this patient is OBSERVATION. Observation status is judged to be reasonable and necessary in order to provide the required intensity of service to ensure the patient's safety. The patient's presenting symptoms, physical exam findings, and initial radiographic and laboratory data in the context of their medical condition is felt to place them at decreased risk for further clinical deterioration. Furthermore, it is anticipated that the patient will be medically stable for discharge from the hospital within 2 midnights of admission.   Author: Lilyan Gilford, DO 02/11/2023 4:37 AM  For on call review [email or web address].

## 2023-02-11 NOTE — Assessment & Plan Note (Addendum)
-   Shortness of breath-volume overloaded - Likely cardiorenal syndrome, - Plan for HD Today - Chest x-ray shows cardiomegaly, vascular congestion, right and left pleural effusions - Patient complains of orthopnea - She has an increased oxygen requirement from 1-2 currently needing 3 L of oxygen 95% - No peripheral edema - She does have dyspnea - Last echo was in December 23 and showed an ejection fraction of 50-55% with grade 1 diastolic dysfunction - Update Echo;   - Continue beta-blocker - Continue to monitor

## 2023-02-12 DIAGNOSIS — J9601 Acute respiratory failure with hypoxia: Secondary | ICD-10-CM | POA: Diagnosis not present

## 2023-02-12 LAB — CBC
HCT: 29.1 % — ABNORMAL LOW (ref 36.0–46.0)
Hemoglobin: 9.2 g/dL — ABNORMAL LOW (ref 12.0–15.0)
MCH: 28.7 pg (ref 26.0–34.0)
MCHC: 31.6 g/dL (ref 30.0–36.0)
MCV: 90.7 fL (ref 80.0–100.0)
Platelets: 313 10*3/uL (ref 150–400)
RBC: 3.21 MIL/uL — ABNORMAL LOW (ref 3.87–5.11)
RDW: 18 % — ABNORMAL HIGH (ref 11.5–15.5)
WBC: 8.6 10*3/uL (ref 4.0–10.5)
nRBC: 0 % (ref 0.0–0.2)

## 2023-02-12 LAB — BPAM RBC: ISSUE DATE / TIME: 202407221121

## 2023-02-12 LAB — BASIC METABOLIC PANEL
Anion gap: 14 (ref 5–15)
BUN: 35 mg/dL — ABNORMAL HIGH (ref 8–23)
CO2: 27 mmol/L (ref 22–32)
Calcium: 8.6 mg/dL — ABNORMAL LOW (ref 8.9–10.3)
Chloride: 94 mmol/L — ABNORMAL LOW (ref 98–111)
Creatinine, Ser: 4.96 mg/dL — ABNORMAL HIGH (ref 0.44–1.00)
GFR, Estimated: 8 mL/min — ABNORMAL LOW (ref >60)
Glucose, Bld: 84 mg/dL (ref 70–99)
Potassium: 3.6 mmol/L (ref 3.5–5.1)
Sodium: 135 mmol/L (ref 135–145)

## 2023-02-12 LAB — TYPE AND SCREEN
Antibody Screen: NEGATIVE
Unit division: 0

## 2023-02-12 LAB — HEPATITIS B SURFACE ANTIBODY, QUANTITATIVE: Hep B S AB Quant (Post): <3.5 m[IU]/mL — ABNORMAL LOW

## 2023-02-12 NOTE — Discharge Summary (Signed)
Physician Discharge Summary   Patient: Felicia Williams MRN: [id number] DOB: 28-Sep-1940  Admit date:     02/10/2023  Discharge date: 02/12/23  Discharge Physician: Kendell Bane   PCP: Rebekah Chesterfield, NP   Recommendations at discharge:    Follow-up with your neurologist  Continue hemodialysis as scheduled  Continue aggressive PT OT, fall precautions Per nephrology : Preferred narcotic agents for pain control are hydromorphone, fentanyl, and methadone. Morphine should not be used.       Baclofen should be avoided      Avoid oral sodium phosphate and magnesium citrate based laxatives / bowel preps   Discharge Diagnoses: Principal Problem:   Acute respiratory failure with hypoxia (HCC) Active Problems:   History of anemia due to chronic kidney disease   ESRD on dialysis [facility])   Essential hypertension, benign   Generalized weakness   Volume overload   GERD (gastroesophageal reflux disease)   (HFpEF) heart failure with preserved ejection fraction (HCC)  Resolved Problems:   * No resolved hospital problems. *  Hospital Course: Felicia Williams is a 82F PMH including ESRD on hemodialysis at [facility] MWF using AVG, recent admission 7/8 through 02/05/2023 for sepsis secondary to lobar pneumonia treated with IV antibiotics, chronic hypoxic respiratory failure on 1-2 L O2 via [place] chronically, HTN. HLD, HFrEF, GERD, presented from rehab with a chief complaint of dyspnea, orthopnea.    In the ED; she is requiring 3 L nasal cannula.  Labs notable for K4.1, BUN 60, hemoglobin 6.8 and it was 7.3 upon discharge 1 week ago.   * Acute respiratory failure with hypoxia (HCC) -Resolved at baseline - Secondary to volume overload secondary to ESRD - O2 demand up to 3 L - Patient is on 1- 2 L nasal cannula at home for the last month - Anticipate improvement after dialysis - Chest x-ray shows cardiomegaly, vascular congestion, pleural effusions - Continue to monitor  History of anemia  due to chronic kidney disease.    Latest Ref Rng & Units 02/12/2023    4:48 AM 02/11/2023    3:21 AM 02/10/2023    7:52 PM  CBC  WBC 4.0 - 10.5 K/uL 8.6  9.4  11.4   Hemoglobin 12.0 - 15.0 g/dL 9.2  6.8  7.2   Hematocrit 36.0 - 46.0 % 29.1  21.6  23.3   Platelets 150 - 400 K/uL 313  PLATELETS APPEAR ADEQUATE  327      -Anticipating blood transfusion with hemodialysis today  Nephrology recommending 1 unit -Monitoring H&H   ESRD on dialysis [facility]) -History of end-stage renal disease on hemodialysis at [facility] MWF using AVG   - Last dialyzed on Friday, it was a full session  - Associated anemia of chronic disease with a hemoglobin of 6.8>> 9.2  - Using 1 units of PRBC with hemodialysis today  - Continue rena-vit - Nephro consulted -Dr. Marisue Humble following -Status post hemodialysis 02/11/2023  Essential hypertension, benign - Continue Coreg  (HFpEF) heart failure with preserved ejection fraction (HCC) - Shortness of breath-volume overloaded - Likely cardiorenal syndrome, - Plan for HD Today - Chest x-ray shows cardiomegaly, vascular congestion, right and left pleural effusions - Patient complains of orthopnea - She has an increased oxygen requirement from 1-2 currently needing 3 L of oxygen 95% - No peripheral edema - She does have dyspnea - Last echo was in December 23 and showed an ejection fraction of 50-55% with grade 1 diastolic dysfunction - Update Echo;   - Continue  beta-blocker - Continue to monitor  GERD (gastroesophageal reflux disease) - Continue Protonix and Pepcid  Volume overload - Secondary to ESRD primarily with CHF (grade 1 diastolic dysfunction ) also contributing - Defer volume management to nephrology  Generalized weakness -Once stable consulting PT for evaluation -Currently patient difficulty with ambulation and carrying her ADLs        Consultants: Nephrology Procedures performed: Hemodialysis Disposition: Skilled nursing  facility Diet recommendation:  Discharge Diet Orders (From admission, onward)     Start     Ordered   02/12/23 0000  Diet - low sodium heart healthy        02/12/23 0945           Renal diet DISCHARGE MEDICATION: Allergies as of 02/12/2023       Reactions   Cefepime Other (See Comments)   Feb 2023 Encephalopathy with questionable seizures. Seems to be tolerating cefazolin , but the daughter mentioned not to start her on any cephalosporin    Morphine Other (See Comments)   "Dry heaving like crazy"   Sulfa Antibiotics Other (See Comments)   Shut pt's kidneys down        Medication List     STOP taking these medications    loperamide 2 MG capsule Commonly known as: IMODIUM   midodrine 10 MG tablet Commonly known as: PROAMATINE   prochlorperazine 5 MG tablet Commonly known as: COMPAZINE   traZODone 100 MG tablet Commonly known as: DESYREL       TAKE these medications    (feeding supplement) PROSource Plus liquid Take 30 mLs by mouth 2 (two) times daily between meals.   acetaminophen 325 MG tablet Commonly known as: TYLENOL Take 2 tablets (650 mg total) by mouth every 6 (six) hours as needed for mild pain.   carvedilol 12.5 MG tablet Commonly known as: COREG Take 1 tablet by mouth 2 (two) times daily.   famotidine 40 MG tablet Commonly known as: Pepcid Take 1 tablet (40 mg total) by mouth at bedtime. What changed:  when to take this reasons to take this   hydrALAZINE 50 MG tablet Commonly known as: APRESOLINE Take 50 mg by mouth 2 (two) times daily. Do not take morning dose on Dialysis days. Monday,Wednesday and Friday   HYDROcodone-acetaminophen 10-325 MG tablet Commonly known as: NORCO Take 1 tablet by mouth every 6 (six) hours as needed for moderate pain.   multivitamin Tabs tablet Take 1 tablet by mouth daily. One daily   nitroGLYCERIN 0.4 MG SL tablet Commonly known as: NITROSTAT Place 0.4 mg under the tongue every 5 (five) minutes as  needed for chest pain.   pantoprazole 40 MG tablet Commonly known as: PROTONIX Take 1 tablet (40 mg total) by mouth daily.        Discharge Exam: Filed Weights   02/11/23 0421 02/11/23 0943 02/11/23 1325  Weight: 45 kg 45 kg 42.3 kg        General:  AAO x 3,  cooperative, no distress;   HEENT:  Normocephalic, PERRL, otherwise with in Normal limits   Neuro:  CNII-XII intact. , normal motor and sensation, reflexes intact   Lungs:   Clear to auscultation BL, Respirations unlabored,  No wheezes / crackles  Cardio:    S1/S2, RRR, No murmure, No Rubs or Gallops   Abdomen:  Soft, non-tender, bowel sounds active all four quadrants, no guarding or peritoneal signs.  Muscular  skeletal:  Limited exam -global generalized weaknesses - in bed, able to move all  4 extremities,   2+ pulses,  symmetric, No pitting edema  Skin:  Dry, warm to touch, negative for any Rashes,  Wounds: Please see nursing documentation  Pressure Injury 04/22/22 Sacrum Right Stage 1 -  Intact skin with non-blanchable redness of a localized area usually over a bony prominence. (Active)  04/22/22 2330  Location: Sacrum  Location Orientation: Right  Staging: Stage 1 -  Intact skin with non-blanchable redness of a localized area usually over a bony prominence.  Wound Description (Comments):   Present on Admission: Yes         Condition at discharge: fair  The results of significant diagnostics from this hospitalization (including imaging, microbiology, ancillary and laboratory) are listed below for reference.   Imaging Studies: ECHOCARDIOGRAM COMPLETE  Result Date: 02/11/2023    ECHOCARDIOGRAM REPORT   Patient Name:   MAYAH URQUIDI Date of Exam: 02/11/2023 Medical Rec #:  [id number]        Height:       59.0 in Accession #:    [id number]       Weight:       99.2 lb Date of Birth:  11-28-1940        BSA:          1.369 m Patient Age:    81 years         BP:           193/66 mmHg Patient Gender: F                 HR:           75 bpm. [facility] Procedure: 3D Echo, 2D Echo, Cardiac Doppler, Color Doppler and Intracardiac            Opacification Agent Indications:    I50.40* Unspecified combined systolic (congestive) and diastolic                 (congestive) heart failure  History:        Patient has prior history of Echocardiogram examinations, most                 recent 07/12/2021. Abnormal ECG; Risk Factors:Hypertension and                 Dyslipidemia. ESRD.  Sonographer:    Sheralyn Boatman RDCS Referring Phys: [id number] ASIA B ZIERLE-GHOSH IMPRESSIONS  1. Left ventricular ejection fraction, by estimation, is 35 to 40%. The left ventricle has moderately decreased function. The left ventricle demonstrates global hypokinesis. Left ventricular diastolic parameters are consistent with Grade I diastolic dysfunction (impaired relaxation). Elevated left atrial pressure.  2. Right ventricular systolic function is mildly reduced. The right ventricular size is normal.  3. Left atrial size was mildly dilated.  4. Mild mitral valve regurgitation.  5. The aortic valve is tricuspid. Aortic valve regurgitation is mild. Aortic valve sclerosis/calcification is present, without any evidence of aortic stenosis.  6. The inferior vena cava is normal in size with greater than 50% respiratory variability, suggesting right atrial pressure of 3 mmHg. Comparison(s): The left ventricular function is worsened. FINDINGS  Left Ventricle: Left ventricular ejection fraction, by estimation, is 35 to 40%. The left ventricle has moderately decreased function. The left ventricle demonstrates global hypokinesis. Definity contrast agent was given IV to delineate the left ventricular endocardial borders. The left ventricular internal cavity size was normal in size. There is no left ventricular hypertrophy. Left ventricular diastolic parameters are consistent with Grade I diastolic  dysfunction (impaired relaxation). Elevated left atrial pressure.  Right Ventricle: The right ventricular size is normal. Right vetricular wall thickness was not assessed. Right ventricular systolic function is mildly reduced. Left Atrium: Left atrial size was mildly dilated. Right Atrium: Right atrial size was normal in size. Pericardium: There is no evidence of pericardial effusion. Mitral Valve: There is mild thickening of the mitral valve leaflet(s). Mild mitral annular calcification. Mild mitral valve regurgitation. Tricuspid Valve: The tricuspid valve is normal in structure. Tricuspid valve regurgitation is trivial. Aortic Valve: The aortic valve is tricuspid. Aortic valve regurgitation is mild. Aortic regurgitation PHT measures 498 msec. Aortic valve sclerosis/calcification is present, without any evidence of aortic stenosis. Aortic valve mean gradient measures 5.0  mmHg. Aortic valve peak gradient measures 10.4 mmHg. Aortic valve area, by VTI measures 3.13 cm. Pulmonic Valve: The pulmonic valve was not well visualized. Pulmonic valve regurgitation is not visualized. No evidence of pulmonic stenosis. Aorta: The aortic root and ascending aorta are structurally normal, with no evidence of dilitation. Venous: The inferior vena cava is normal in size with greater than 50% respiratory variability, suggesting right atrial pressure of 3 mmHg. IAS/Shunts: No atrial level shunt detected by color flow Doppler.  LEFT VENTRICLE PLAX 2D LVIDd:         5.00 cm      Diastology LVIDs:         3.60 cm      LV e' medial:    4.35 cm/s LV PW:         1.10 cm      LV E/e' medial:  17.0 LV IVS:        1.10 cm      LV e' lateral:   5.00 cm/s LVOT diam:     2.20 cm      LV E/e' lateral: 14.8 LV SV:         105 LV SV Index:   77 LVOT Area:     3.80 cm                              3D Volume EF: LV Volumes (MOD)            3D EF:        39 % LV vol d, MOD A2C: 113.0 ml LV EDV:       159 ml LV vol d, MOD A4C: 117.0 ml LV ESV:       97 ml LV vol s, MOD A2C: 70.3 ml  LV SV:        63 ml LV vol s, MOD  A4C: 70.3 ml LV SV MOD A2C:     42.7 ml LV SV MOD A4C:     117.0 ml LV SV MOD BP:      43.5 ml RIGHT VENTRICLE            IVC RV S prime:     6.09 cm/s  IVC diam: 1.40 cm TAPSE (M-mode): 1.7 cm LEFT ATRIUM             Index        RIGHT ATRIUM           Index LA diam:        4.30 cm 3.14 cm/m   RA Area:     10.30 cm LA Vol (A2C):   54.4 ml 39.73 ml/m  RA Volume:   23.10 ml  16.87 ml/m LA Vol (  A4C):   36.0 ml 26.29 ml/m LA Biplane Vol: 45.4 ml 33.16 ml/m  AORTIC VALVE AV Area (Vmax):    3.14 cm AV Area (Vmean):   3.13 cm AV Area (VTI):     3.13 cm AV Vmax:           161.00 cm/s AV Vmean:          108.000 cm/s AV VTI:            0.336 m AV Peak Grad:      10.4 mmHg AV Mean Grad:      5.0 mmHg LVOT Vmax:         133.00 cm/s LVOT Vmean:        88.900 cm/s LVOT VTI:          0.277 m LVOT/AV VTI ratio: 0.82 AI PHT:            498 msec  AORTA Ao Root diam: 3.40 cm Ao Asc diam:  3.15 cm MITRAL VALVE MV Area (PHT): 4.15 cm    SHUNTS MV Decel Time: 183 msec    Systemic VTI:  0.28 m MV E velocity: 74.10 cm/s  Systemic Diam: 2.20 cm MV A velocity: 97.90 cm/s MV E/A ratio:  0.76 Dietrich Pates MD Electronically signed by Dietrich Pates MD Signature Date/Time: 02/11/2023/3:48:19 PM    Final    DG Chest Port 1 View  Result Date: 02/10/2023 CLINICAL DATA:  Needing dialysis, pain. EXAM: PORTABLE CHEST 1 VIEW COMPARISON:  01/28/2023. FINDINGS: The heart is enlarged and the mediastinal contour stable. There is atherosclerotic calcification of the aorta. The pulmonary vasculature is distended. Diffuse interstitial and airspace opacities are noted in the lungs bilaterally. There is a moderate pleural effusion on the right and small pleural effusion on the left. No acute osseous abnormality. IMPRESSION: 1. Cardiomegaly with pulmonary vascular congestion. 2. Diffuse interstitial and airspace opacities bilaterally, possible edema or infiltrate. 3. Moderate right pleural effusion and small left pleural effusion. Electronically Signed    By: Thornell Sartorius M.D.   On: 02/10/2023 20:13   IR US Guide Vasc Access Left  Result Date: 02/06/2023 INDICATION: 82 year old with concern for partial thrombosis of the left upper arm AV graft. EXAM: 1. Left upper extremity shuntogram 2. Balloon angioplasty of graft/vein 3. Ultrasound guidance for vascular access MEDICATIONS: Moderate sedation ANESTHESIA/SEDATION: Moderate (conscious) sedation was employed during this procedure. A total of Versed 1.5mg  and fentanyl 75 mcg was administered intravenously at the order of the provider performing the procedure. Total intra-service moderate sedation time: 45 minutes. Patient's level of consciousness and vital signs were monitored continuously by radiology nurse throughout the procedure under the supervision of the provider performing the procedure. FLUOROSCOPY: Radiation Exposure Index (as provided by the fluoroscopic device): 13 mGy Kerma CONTRAST:  40 mL Omnipaque 300 COMPLICATIONS: None immediate. TECHNIQUE: Informed written consent was obtained from the patient after a thorough discussion of the procedural risks, benefits and alternatives. All questions were addressed. Maximal Sterile Barrier Technique was utilized including caps, mask, sterile gowns, sterile gloves, sterile drape, hand hygiene and skin antiseptic. A timeout was performed prior to the initiation of the procedure. Ultrasound demonstrated a patent left upper arm AV graft. Ultrasound image was saved for documentation. Left upper arm was prepped and draped in sterile fashion. Skin was anesthetized using 1% lidocaine. Using ultrasound guidance, a 21 gauge needle was directed into the distal aspect of the graft. Micropuncture dilator set was placed. Shuntogram images were obtained. Stenosis near the venous anastomosis  was targeted for treatment. 6 [place] vascular sheath was placed over a Bentson wire and Bentson wire was advanced centrally using a Kumpe catheter. The area of stenosis was treated with a  6 mm x 40 mm Mustang balloon. Follow-up shuntogram images were obtained. The area of stenosis was treated again with a 7 mm x 40 mm Mustang balloon. Follow-up shuntogram images were obtained. Vascular sheath was removed with a pursestring suture. FINDINGS: Left upper arm AV graft was patent but there was a stenosis near the venous anastomosis measuring approximately 50%. There was retrograde filling of a large vein down the left arm. Central veins were patent. Arterial anastomosis was widely patent but there was slight kink in the graft just beyond the arterial anastomosis. This configuration of the graft near the arterial anastomosis did not appear to be flow limiting by ultrasound. The area of stenosis near the venous anastomosis was treated with balloon angioplasty using 6 mm and 7 mm balloons. Near complete resolution of the venous stenosis following balloon angioplasty. At the end of the procedure, there was decreased filling of the branch vessels near the venous anastomosis. IMPRESSION: 1. Patent left upper arm AV graft. 2. Successful treatment of the stenosis near the venous anastomosis using balloon angioplasty. Electronically Signed   By: Richarda Overlie M.D.   On: 02/06/2023 07:36   CT CHEST WO CONTRAST  Result Date: 02/02/2023 CLINICAL DATA:  82 year old female with history of end-stage renal disease. Possible pneumonia. Abnormal chest x-ray. EXAM: CT CHEST WITHOUT CONTRAST TECHNIQUE: Multidetector CT imaging of the chest was performed following the standard protocol without IV contrast. RADIATION DOSE REDUCTION: This exam was performed according to the departmental dose-optimization program which includes automated exposure control, adjustment of the mA and/or kV according to patient size and/or use of iterative reconstruction technique. COMPARISON:  Chest x-ray 01/28/2023.  Chest CTA 07/11/2022. FINDINGS: Cardiovascular: Heart size is mildly enlarged. There is no significant pericardial fluid, thickening  or pericardial calcification. There is aortic atherosclerosis, as well as atherosclerosis of the great vessels of the mediastinum and the coronary arteries, including calcified atherosclerotic plaque in the left main, left anterior descending, left circumflex and right coronary arteries. Calcifications of the aortic valve. Mediastinum/Nodes: No pathologically enlarged mediastinal or hilar lymph nodes. Multiple prominent borderline enlarged mediastinal and bilateral hilar lymph nodes are noted, nonspecific. Esophagus is unremarkable in appearance. No axillary lymphadenopathy. Lungs/Pleura: Moderate to large bilateral pleural effusions lying dependently with areas of passive atelectasis in the lower lobes of the lungs bilaterally. Aerated portions of the lungs are remarkable for widespread ground-glass attenuation, interlobular septal thickening and patchy areas of what appears to be nodular airspace consolidation, likely reflective of edema. Study is limited by respiratory motion, but no large suspicious appearing pulmonary nodules or masses are confidently identified. Upper Abdomen: Aortic atherosclerosis. Visualized portions of the left kidney demonstrates severe left renal atrophy. Low-attenuation lesion in the upper pole of the left kidney measuring 1.7 cm in diameter, incompletely characterized on today's noncontrast CT examination, but statistically likely a cyst (no imaging follow-up recommended). Musculoskeletal: There are no aggressive appearing lytic or blastic lesions noted in the visualized portions of the skeleton. IMPRESSION: 1. Overall, the appearance of the chest is most suggestive of congestive heart failure with cardiomegaly, pulmonary edema and moderate to large bilateral pleural effusions with extensive passive atelectasis in the dependent portions of the lungs bilaterally. 2. Aortic atherosclerosis, in addition to left main and three-vessel coronary artery disease. 3. There are calcifications of  the aortic  valve. Echocardiographic correlation for evaluation of potential valvular dysfunction may be warranted if clinically indicated. 4. Additional incidental findings, as above. Aortic Atherosclerosis (ICD10-I70.0). Electronically Signed   By: Trudie Reed M.D.   On: 02/02/2023 13:28   CT HEAD WO CONTRAST ( )  Result Date: 02/02/2023 CLINICAL DATA:  82 year old female with history of altered mental status. EXAM: CT HEAD WITHOUT CONTRAST TECHNIQUE: Contiguous axial images were obtained from the base of the skull through the vertex without intravenous contrast. RADIATION DOSE REDUCTION: This exam was performed according to the departmental dose-optimization program which includes automated exposure control, adjustment of the mA and/or kV according to patient size and/or use of iterative reconstruction technique. COMPARISON:  Head CT 08/31/2021. FINDINGS: Brain: Mild cerebral and cerebellar atrophy. Patchy and confluent areas of decreased attenuation are noted throughout the deep and periventricular white matter of the cerebral hemispheres bilaterally, compatible with chronic microvascular ischemic disease. No evidence of acute infarction, hemorrhage, hydrocephalus, extra-axial collection or mass lesion/mass effect. Vascular: No hyperdense vessel or unexpected calcification. Skull: Normal. Negative for fracture or focal lesion. Sinuses/Orbits: No acute finding. Other: None. IMPRESSION: 1. No acute intracranial abnormalities. 2. Mild cerebral and cerebellar atrophy with mild chronic microvascular ischemic changes in the cerebral white matter, as above. Electronically Signed   By: Trudie Reed M.D.   On: 02/02/2023 07:10   IR THROMBECTOMY AV FISTULA W/THROMBOLYSIS/PTA INC/SHUNT/IMG LEFT  Result Date: 02/01/2023 INDICATION: 82 year old with concern for partial thrombosis of the left upper arm AV graft. EXAM: 1. Left upper extremity shuntogram 2. Balloon angioplasty of graft/vein 3. Ultrasound  guidance for vascular access MEDICATIONS: Moderate sedation ANESTHESIA/SEDATION: Moderate (conscious) sedation was employed during this procedure. A total of Versed 1.5mg  and fentanyl 75 mcg was administered intravenously at the order of the provider performing the procedure. Total intra-service moderate sedation time: 45 minutes. Patient's level of consciousness and vital signs were monitored continuously by radiology nurse throughout the procedure under the supervision of the provider performing the procedure. FLUOROSCOPY: Radiation Exposure Index (as provided by the fluoroscopic device): 13 mGy Kerma CONTRAST:  40 mL Omnipaque 300 COMPLICATIONS: None immediate. TECHNIQUE: Informed written consent was obtained from the patient after a thorough discussion of the procedural risks, benefits and alternatives. All questions were addressed. Maximal Sterile Barrier Technique was utilized including caps, mask, sterile gowns, sterile gloves, sterile drape, hand hygiene and skin antiseptic. A timeout was performed prior to the initiation of the procedure. Ultrasound demonstrated a patent left upper arm AV graft. Ultrasound image was saved for documentation. Left upper arm was prepped and draped in sterile fashion. Skin was anesthetized using 1% lidocaine. Using ultrasound guidance, a 21 gauge needle was directed into the distal aspect of the graft. Micropuncture dilator set was placed. Shuntogram images were obtained. Stenosis near the venous anastomosis was targeted for treatment. 6 [place] vascular sheath was placed over a Bentson wire and Bentson wire was advanced centrally using a Kumpe catheter. The area of stenosis was treated with a 6 mm x 40 mm Mustang balloon. Follow-up shuntogram images were obtained. The area of stenosis was treated again with a 7 mm x 40 mm Mustang balloon. Follow-up shuntogram images were obtained. Vascular sheath was removed with a pursestring suture. FINDINGS: Left upper arm AV graft was  patent but there was a stenosis near the venous anastomosis measuring approximately 50%. There was retrograde filling of a large vein down the left arm. Central veins were patent. Arterial anastomosis was widely patent but there was slight kink in the graft  just beyond the arterial anastomosis. This configuration of the graft near the arterial anastomosis did not appear to be flow limiting by ultrasound. The area of stenosis near the venous anastomosis was treated with balloon angioplasty using 6 mm and 7 mm balloons. Near complete resolution of the venous stenosis following balloon angioplasty. At the end of the procedure, there was decreased filling of the branch vessels near the venous anastomosis. IMPRESSION: 1. Patent left upper arm AV graft. 2. Successful treatment of the stenosis near the venous anastomosis using balloon angioplasty. Electronically Signed   By: Richarda Overlie M.D.   On: 02/01/2023 23:16   DG Chest Portable 1 View  Result Date: 01/29/2023 CLINICAL DATA:  Fever, questionable sepsis EXAM: PORTABLE CHEST 1 VIEW COMPARISON:  01/06/2023 FINDINGS: Heart is borderline in size. Aortic atherosclerosis. Diffuse interstitial prominence with bilateral lower lobe airspace opacities and layering effusions. Favor edema although infection is not excluded. Airspace opacities and effusions have worsened since prior study. IMPRESSION: Diffuse interstitial prominence with lower lobe airspace opacities and layering effusions. Favor edema although infection is not excluded. Electronically Signed   By: Charlett Nose M.D.   On: 01/29/2023 00:02    Microbiology: Results for orders placed or performed during the hospital encounter of 02/10/23  MRSA Next Gen by PCR, Nasal     Status: None   Collection Time: 02/10/23 11:55 PM   Specimen: Nasal Mucosa; Nasal Swab  Result Value Ref Range Status   MRSA by PCR Next Gen NOT DETECTED NOT DETECTED Final    Comment: (NOTE) The GeneXpert MRSA Assay (FDA approved for NASAL  specimens only), is one component of a comprehensive MRSA colonization surveillance program. It is not intended to diagnose MRSA infection nor to guide or monitor treatment for MRSA infections. Test performance is not FDA approved in patients less than 74 years old. Performed at [facility], [address], [place], [place] [address]     Labs: CBC: Recent Labs  Lab 02/10/23 1952 02/11/23 0321 02/12/23 0448  WBC 11.4* 9.4 8.6  NEUTROABS  --  7.2  --   HGB 7.2* 6.8* 9.2*  HCT 23.3* 21.6* 29.1*  MCV 92.5 92.7 90.7  PLT 327 PLATELETS APPEAR ADEQUATE 313   Basic Metabolic Panel: Recent Labs  Lab 02/10/23 1952 02/11/23 0321 02/12/23 0448  NA 132* 132* 135  K 3.8 4.1 3.6  CL 96* 95* 94*  CO2 23 25 27   GLUCOSE 120* 96 84  BUN 53* 60* 35*  CREATININE 7.02* 7.61* 4.96*  CALCIUM 8.3* 8.2* 8.6*  MG  --  2.1  --    Liver Function Tests: Recent Labs  Lab 02/11/23 0321  AST 21  ALT 15  ALKPHOS 104  BILITOT 0.7  PROT 6.2*  ALBUMIN 2.1*   CBG: No results for input(s): "GLUCAP" in the last 168 hours.  Discharge time spent: greater than 30 minutes.  Signed: Kendell Bane, MD [facility] 02/12/2023

## 2023-02-12 NOTE — Progress Notes (Signed)
Admit: 02/10/2023 LOS: 0  Felicia Williams ESRD MWF Felicia Williams here with AHRF, dyspnea, volume overload.   Subjective:  HD yesterday 3L UF Transfused yesterday, Hb inc as expected to 9.2 Down to baseline 2L [name] Feels dyspnea improved, has been working with PT some BPs improved after HD  07/22 0701 - 07/23 0700 In: 810 [P.O.:120; Blood:690] Out: 3000   Filed Weights   02/11/23 0421 02/11/23 0943 02/11/23 1325  Weight: 45 kg 45 kg 42.3 kg    Scheduled Meds:  carvedilol  12.5 mg Oral BID   Chlorhexidine Gluconate Cloth  6 each Topical Q0600   darbepoetin (ARANESP) injection - DIALYSIS  200 mcg Subcutaneous Q Mon-1800   famotidine  20 mg Oral QHS   feeding supplement  237 mL Oral BID BM   heparin  5,000 Units Subcutaneous Q8H   hydrALAZINE  50 mg Oral BID   multivitamin  1 tablet Oral Daily   pantoprazole  40 mg Oral Daily   Continuous Infusions: PRN Meds:.acetaminophen **OR** acetaminophen, ondansetron **OR** ondansetron (ZOFRAN) IV, oxyCODONE  Current Labs: reviewed    Physical Exam:  Blood pressure (!) 162/54, pulse 70, temperature 98.7 F (37.1 C), temperature source Oral, resp. rate 18, height 4\' 11"  (1.499 m), weight 42.3 kg, SpO2 97%. GEN: chronically ill appearing, breathing more comfortably ENT: NCAT EYES: EOMI CV: RRR no rub PULM: Crackles in Left base otherwise, clear, improved ABD: s/nt/nd SKIN: no rashes EXT:no sig Edema VASCULAR LUE AVG +B/T  A Pulmonary Edema, AoC hypoxic RF, volume overload; imrpved after HD 02/11/23 ESRD MWF Felicia Williams using AVG prev HD 4x/wk recently back to 3x/wk; frequency of HD per outpt nephrology Anemia, worsened, contributing likely to presenting symptoms; transfused 7/22 AoC HFpEF / diastolic dysfunction; improved CKD BMD, hyerphosphatemia; prev stated can't tolerate any binders, defer to outpt mgmt HTN emergency /Vol, as above; BPs improved; symptoms improved; 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 Morphine should not be used.  Baclofen should be avoided Avoid oral sodium phosphate and magnesium citrate based laxatives / bowel preps    Felicia Williams 02/12/2023, 8:14 AM  Recent Labs  Lab 02/10/23 1952 02/11/23 0321 02/12/23 0448  NA 132* 132* 135  K 3.8 4.1 3.6  CL 96* 95* 94*  CO2 23 25 27   GLUCOSE 120* 96 84  BUN 53* 60* 35*  CREATININE 7.02* 7.61* 4.96*  CALCIUM 8.3* 8.2* 8.6*   Recent Labs  Lab 02/10/23 1952 02/11/23 0321 02/12/23 0448  WBC 11.4* 9.4 8.6  NEUTROABS  --  7.2  --   HGB 7.2* 6.8* 9.2*  HCT 23.3* 21.6* 29.1*  MCV 92.5 92.7 90.7  PLT 327 PLATELETS APPEAR ADEQUATE 313

## 2023-02-12 NOTE — Plan of Care (Signed)

## 2023-02-12 NOTE — Evaluation (Signed)
Occupational Therapy Evaluation Patient Details Name: Felicia Williams MRN: [id number] DOB: 11/25/1940 Today's Date: 02/12/2023   History of Present Illness Felicia Williams is a 82 y.o. female with medical history significant of CHF, anemia, ESRD on HD, hypertension, GERD, mixed hyperlipidemia, and more presents the ED with a chief complaint of dyspnea.  Patient reports that it woke her up out of sleep at 5 AM on the day of presentation.  She has not been able to lay flat.  She denies any chest pain, peripheral edema.  She does report a nonproductive cough it is not necessarily new.  Patient reports she really feels like her dialysis just needs to get started.  Her last dialysis session was on July 19.  It was a full session.  Patient reports that in the past she has had trouble keeping up with her fluid.  For about a 4-5-week.  In the spring they were doing her dialysis 4 times per week.  As her fluid became better controlled they advised that she could drop back down to 3 times per week.  Patient reports that she sometimes gets behind.  She has no urine output.  Patient wears 1-2 L nasal cannula at baseline.  She only been wearing this for about a month.  Today she is requiring 3 L nasal cannula.  Patient's dyspnea is worse with exertion.  She has no other complaints at this time.   Clinical Impression   Pt agreeable to OT evaluation. Pt reports her son is coming to live with her at that she is going home. Pt demonstrated modified independent bed mobility and ability to complete seated ADL's with set up assist. Min G assist needed when standing and ambulating without RW. Pt noted to lean on the walls. Pt could be appropriate for home health OT if she has her son living with her and her son is able to be with her during all transfers. Pt was left in the chair with chair alarm set and call bell within reach. Pt will benefit from continued OT in the hospital and recommended venue below to increase strength,  balance, and endurance for safe ADL's.         Recommendations for follow up therapy are one component of a multi-disciplinary discharge planning process, led by the attending physician.  Recommendations may be updated based on patient status, additional functional criteria and insurance authorization.   Assistance Recommended at Discharge Intermittent Supervision/Assistance  Patient can return home with the following A little help with walking and/or transfers;A little help with bathing/dressing/bathroom;Assistance with cooking/housework;Assist for transportation;Help with stairs or ramp for entrance    Functional Status Assessment  Patient has had a recent decline in their functional status and demonstrates the ability to make significant improvements in function in a reasonable and predictable amount of time.  Equipment Recommendations  None recommended by OT    Recommendations for Other Services       Precautions / Restrictions Precautions Precautions: Fall Restrictions Weight Bearing Restrictions: No      Mobility Bed Mobility Overal bed mobility: Modified Independent                  Transfers Overall transfer level: Needs assistance   Transfers: Sit to/from Stand, Bed to chair/wheelchair/BSC Sit to Stand: Min guard     Step pivot transfers: Min guard     General transfer comment: Mildly slow movement; unsteady in standing without AD.      Balance Overall balance assessment: Needs assistance Sitting-balance  support: Feet supported, No upper extremity supported Sitting balance-Leahy Scale: Good Sitting balance - Comments: seated at EOB   Standing balance support: No upper extremity supported, During functional activity Standing balance-Leahy Scale: Fair Standing balance comment: poor to fair without AD                           ADL either performed or assessed with clinical judgement   ADL Overall ADL's : Needs assistance/impaired      Grooming: Min guard;Standing   Upper Body Bathing: Set up;Sitting   Lower Body Bathing: Set up;Sitting/lateral leans   Upper Body Dressing : Set up;Sitting   Lower Body Dressing: Set up;Sitting/lateral leans   Toilet Transfer: Min guard;Ambulation Toilet Transfer Details (indicate cue type and reason): chair to toilet and back Toileting- Clothing Manipulation and Hygiene: Min guard;Sitting/lateral lean;Set up       Functional mobility during ADLs: Min guard General ADL Comments: Pt able to ambulate to toilet and back to chair while leaning on walls.     Vision Baseline Vision/History: 1 Wears glasses Ability to See in Adequate Light: 1 Impaired Patient Visual Report: No change from baseline Vision Assessment?: No apparent visual deficits                Pertinent Vitals/Pain Pain Assessment Pain Assessment: 0-10 Pain Score: 3  Pain Location: back Pain Descriptors / Indicators: Other (Comment) (arthritis; stenosis)     Hand Dominance Right   Extremity/Trunk Assessment Upper Extremity Assessment Upper Extremity Assessment: Generalized weakness   Lower Extremity Assessment Lower Extremity Assessment: Defer to PT evaluation   Cervical / Trunk Assessment Cervical / Trunk Assessment: Normal   Communication Communication Communication: No difficulties   Cognition Arousal/Alertness: Awake/alert Behavior During Therapy: WFL for tasks assessed/performed Overall Cognitive Status: Within Functional Limits for tasks assessed                                                        Home Living Family/patient expects to be discharged to:: Private residence Living Arrangements: Children Available Help at Discharge: Family;Available 24 hours/day Type of Home: House Home Access: Stairs to enter [employer] of Steps: 1 Entrance Stairs-Rails: None Home Layout: Able to live on main level with bedroom/bathroom;Laundry or work area in  basement     [employer] Shower/Tub: Chief Strategy Officer: Standard Bathroom Accessibility: Yes   Home Equipment: Cane - single Librarian, academic (2 wheels);Wheelchair - manual;Grab bars - tub/shower;Grab bars - toilet;Shower seat;BSC/3in1   Additional Comments: Reports her son is coming to live with her and will have 24/7 support.      Prior Functioning/Environment Prior Level of Function : Independent/Modified Independent;Driving             Mobility Comments: Short distance community ambulator w/o AD, drives; reports leaning on walls at baseline. ADLs Comments: Independent        OT Problem List: Decreased strength;Decreased activity tolerance;Impaired balance (sitting and/or standing)      OT Treatment/Interventions: Self-care/ADL training;Therapeutic exercise;Therapeutic activities;Patient/family education    OT Goals(Current goals can be found in the care plan section) Acute Rehab OT Goals Patient Stated Goal: return home OT Goal Formulation: With patient Time For Goal Achievement: 02/26/23 Potential to Achieve Goals: Good  OT Frequency: Min 1X/week  AM-PAC OT "6 Clicks" Daily Activity     Outcome Measure Help from another person eating meals?: None Help from another person taking care of personal grooming?: A Little Help from another person toileting, which includes using toliet, bedpan, or urinal?: A Little Help from another person bathing (including washing, rinsing, drying)?: A Little Help from another person to put on and taking off regular upper body clothing?: A Little Help from another person to put on and taking off regular lower body clothing?: A Little 6 Click Score: 19   End of Session Equipment Utilized During Treatment: Oxygen Nurse Communication: Other (comment) (notified pt was in the chair.)  Activity Tolerance: Patient tolerated treatment well Patient left: in chair;with call bell/phone within reach;with  chair alarm set  OT Visit Diagnosis: Unsteadiness on feet (R26.81);Other abnormalities of gait and mobility (R26.89);Muscle weakness (generalized) (M62.81)                Time: 1610-9604 OT Time Calculation (min): 11 min Charges:  OT General Charges $OT Visit: 1 Visit OT Evaluation $OT Eval Low Complexity: 1 Low  Wilmarie Sparlin OT, MOT   Danie Chandler 02/12/2023, 8:43 AM

## 2023-02-12 NOTE — Progress Notes (Signed)
Mobility Specialist Progress Note:    02/12/23 1050  Mobility  Activity Ambulated with assistance in room  Level of Assistance Contact guard assist, steadying assist  Assistive Device Front wheel walker  Distance Ambulated (ft) 28 ft  Range of Motion/Exercises Active;All extremities  Activity Response Tolerated well  Mobility Referral Yes  $Mobility charge 1 Mobility  Mobility Specialist Start Time (ACUTE ONLY) 0945  Mobility Specialist Stop Time (ACUTE ONLY) 0955  Mobility Specialist Time Calculation (min) (ACUTE ONLY) 10 min   Pt received in bed, agreeable to mobility session. SpO2 89% on RA while sitting EOB. Ambulated to door and back (x2) with RW and CGA. While ambulating in room, SpO2 94% on 2L. Tolerated mobility session well, asx throughout. Pt tolerated sitting up in chair, call bell in reach, all needs met.   Feliciana Rossetti Mobility Specialist Please contact via Special educational needs teacher or  Rehab office at [phone]

## 2023-02-12 NOTE — TOC Transition Note (Addendum)
Transition of Care [facility]) - CM/SW Discharge Note   Patient Details  Name: Felicia Williams MRN: [id number] Date of Birth: 17-Feb-1941  [facility]) CM/SW Contact:  Leitha Bleak, RN Phone Number: 02/12/2023, 10:09 AM   Clinical Narrative:   [facility] received for [facility]. 7/23 - 7/25, review due 7/25. Reference ID: [id number]. TOC following for discharge later today.  DC summary sent to [facility], room provided. RN calling report.  Patient wants her daughter to transport her. [name] approved.    Final next level of care: Skilled Nursing Facility Barriers to Discharge: Barriers Resolved   Patient Goals and CMS Choice            Patient and family notified of of transfer: 02/12/23  Discharge Plan and Services Additional resources added to the After Visit Summary for        [facility] Social Determinants of Health (SDOH) Interventions SDOH Screenings   Food Insecurity: No Food Insecurity (02/11/2023)  Housing: Patient Declined (02/11/2023)  Transportation Needs: No Transportation Needs (02/11/2023)  Utilities: Not At Risk (02/11/2023)  Tobacco Use: Low Risk  (02/10/2023)     Readmission Risk Interventions    01/31/2023    9:54 AM 10/25/2022    8:06 AM 10/15/2022   12:28 PM  Readmission Risk Prevention Plan  Transportation Screening Complete Complete Complete  PCP or Specialist Appt within 3-5 Days   Complete  HRI or Home Care Consult Complete Complete Complete  Social Work Consult for Recovery Care Planning/Counseling Complete Complete Complete  Palliative Care Screening Not Applicable Not Applicable Not Applicable  Medication Review Oceanographer) Complete Complete Complete

## 2023-02-12 NOTE — Plan of Care (Signed)
  Problem: Acute Rehab OT Goals (only OT should resolve) Goal: Pt. Will Perform Grooming Flowsheets (Taken 02/12/2023 0845) Pt Will Perform Grooming:  with modified independence  standing Goal: Pt. Will Transfer To Toilet Flowsheets (Taken 02/12/2023 0845) Pt Will Transfer to Toilet:  with modified independence  ambulating Goal: Pt/Caregiver Will Perform Home Exercise Program Flowsheets (Taken 02/12/2023 0845) Pt/caregiver will Perform Home Exercise Program:  Increased strength  Both right and left upper extremity  Independently  Aeris Hersman OT, MOT

## 2023-02-19 ENCOUNTER — Ambulatory Visit: Payer: Medicare Other | Admitting: Internal Medicine

## 2023-02-19 NOTE — Progress Notes (Addendum)
Referring Provider: Rebekah Chesterfield, NP Primary Care Physician:  Rebekah Chesterfield, NP Primary GI Physician: Dr. Levon Hedger  Chief Complaint  Patient presents with   Follow-up    Having some nausea and vomiting. Losing weight.     HPI:   Felicia Williams is a 82 y.o. female with a history of glomerulonephritis now with end-stage renal disease had been on PD for many years and transition to hemodialysis February 2023. She is on MWF treatments. Other pertinent history includes hypertension, hyperlipidemia, gastroparesis, anxiety/depression, GERD, gouty arthritis, and anemia.  She is presenting today for hospital follow-up.  Patient was admitted 7/8 - 7/16 with sepsis, acute on chronic respiratory failure with hypoxia in the setting of pneumonia, metabolic encephalopathy.  She was treated with IV antibiotics and respiratory status improved.  She underwent angioplasty of left AV graft due to stenosis.  She developed recurrent delirium/encephalopathy which prolonged her hospitalization.  GI was ultimately consulted due to recurrent vomiting.  Patient reported feeling fine prior to eating, but would randomly vomit thereafter.  No other significant GI symptoms such as abdominal pain, heartburn, dysphagia, GI bleeding, change in bowel habits.  Overall, patient felt her symptoms were similar to prior gastroparesis flares.  She had not been on any medication for gastroparesis in quite some time.  Previously did well with short course of Reglan and dietary changes.  It was suspected that acute illness may have triggered gastroparesis flare.  She was started on daily PPI and scheduled Compazine with resolution of symptoms.  Patient was readmitted 7/21 - 7/23 with dyspnea, orthopnea, and found to have volume overload and a hemoglobin of 6.8.  She underwent hemodialysis and received 1 unit PRBCs with symptomatic improvement.  Hgb improved to 9.2.  Iron panel during admission showed low iron and iron  saturation, but ferritin of 286.  Previously, B12 and folate within normal limits.   Today: Reports she is currently at [facility] for rehabilitation but is hoping to go home tomorrow.   No nausea, but intermittent vomiting. Comes on out of no where after she starts eating.  Not daily. In the last 4 weeks, has vomited probably 4 times. Vomited after taking pills this morning. Doesn't think she was given Zofran.  States she supposed to be getting Zofran every morning, but sometimes she has to remind the staff to give this to her. She takes Zofran, she tends to do well.  Also reports odynophagia with pain radiating down her esophagus to upper abdomen when eating.  Denies any typical heartburn symptoms.  Unclear if she is having dysphagia.  Describes sensation of food getting to the lower esophagus/upper stomach and causing pain.  She thinks that the pain she experiences is what causes her to vomit.  Denies abdominal pain otherwise, BRBPR, or melena.  She does have early satiety.  States before her hospitalizations, she was eating all the time, but since her hospital discharge, she has been eating less.  Sometimes, she can take a few bites and will feel full, other times she just does not like the food at the nursing facility and will send it back.  Also states she has had some worsening of her chronic diarrhea since she was hospitalized.  States she was having about 7 bowel movements a day while she was in the hospital, some watery stools and nocturnal stools.  Symptoms seem to be improving, but she is taking Imodium several days a week.  Tends to take 2 Imodium in the morning  and then may take an additional 1 or 2 pills.  With this, she is having 1 or 2 bowel movements daily, but still loose.  She has had accidents.  Reports 35 lb weight loss since February 2023.  Per chart review, she weighed 132 pounds in February 2023, 98 pounds in January 2024, and weighs 94 pounds today.  Regarding her  anemia, she does receive IV iron at [facility].  States she has been getting this for a while now.  Denies NSAIDs.  Last GI procedures: EGD 11/16/2020: Grade A esophagitis, widely patent Schatzki's ring at GE junction, 5 cm hiatal hernia, few small sessile polyps in the gastric body biopsied.  Pathology with fundic gland polyps, no H. pylori.   Last colonoscopy 12/28/2015: Normal exam.    Stool studies.  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 Karilyn Cota.   GERD (gastroesophageal reflux disease)    Glomerulonephritis    Gout    Mixed hyperlipidemia    Spondylosis     Past Surgical History:  Procedure Laterality Date   APPENDECTOMY     AV FISTULA PLACEMENT Left 03/20/2022   Procedure: INSERTION OF LEFT UPPER ARM ARTERIOVENOUS (AV) GORE-TEX GRAFT;  Surgeon: Larina Earthly, MD;  Location: [facility];  Service: Vascular;  Laterality: Left;   CATARACT EXTRACTION     COLONOSCOPY N/A 12/28/2015   Procedure: COLONOSCOPY;  Surgeon: Malissa Hippo, MD;  Location: [facility];  Service: Endoscopy;  Laterality: N/A;  815   ESOPHAGOGASTRODUODENOSCOPY N/A 11/16/2020   Procedure: ESOPHAGOGASTRODUODENOSCOPY (EGD);  Surgeon: Malissa Hippo, MD;  Location: [facility];  Service: Endoscopy;  Laterality: N/A;  1:15   EXCHANGE OF A DIALYSIS CATHETER N/A 03/20/2022   Procedure: EXCHANGE OF A DIALYSIS CATHETER;  Surgeon: Larina Earthly, MD;  Location: [facility];  Service: Vascular;  Laterality: N/A;   INSERTION OF DIALYSIS CATHETER Right 09/30/2021   Procedure: INSERTION OF TUNNELED DIALYSIS CATHETER;  Surgeon: Maeola Harman, MD;  Location: [facility] OR;  Service:  Vascular;  Laterality: Right;   IR FLUORO GUIDE CV LINE RIGHT  01/16/2019   IR REMOVAL TUN CV CATH W/O FL  07/01/2019   IR THROMBECTOMY AV FISTULA W/THROMBOLYSIS/PTA INC/SHUNT/IMG LEFT Left 02/01/2023   IR US GUIDE VASC ACCESS LEFT  02/01/2023   IR US GUIDE VASC ACCESS RIGHT  01/16/2019   POLYPECTOMY  11/16/2020   Procedure: POLYPECTOMY;  Surgeon: Malissa Hippo, MD;  Location: [facility];  Service: Endoscopy;;  gastric   REMOVAL OF A DIALYSIS CATHETER  09/30/2021   Procedure: REMOVAL OF A PERITONEAL DIALYSIS CATHETER;  Surgeon: Maeola Harman, MD;  Location: [facility] OR;  Service: Vascular;;   REMOVAL OF A DIALYSIS CATHETER N/A 05/22/2022   Procedure: MINOR REMOVAL OF A TUNNELED DIALYSIS CATHETER;  Surgeon: Larina Earthly, MD;  Location: [facility];  Service: Vascular;  Laterality: N/A;   TUBAL LIGATION      Current Outpatient Medications  Medication Sig Dispense Refill   acetaminophen (TYLENOL) 325 MG tablet Take 2 tablets (650 mg total) by mouth every 6 (six) hours as needed for  mild pain.     carvedilol (COREG) 12.5 MG tablet Take 1 tablet by mouth 2 (two) times daily.     famotidine (PEPCID) 40 MG tablet Take 1 tablet (40 mg total) by mouth at bedtime. (Patient taking differently: Take 40 mg by mouth as needed for heartburn or indigestion.) 30 tablet 1   hydrALAZINE (APRESOLINE) 50 MG tablet Take 50 mg by mouth 2 (two) times daily. Do not take morning dose on Dialysis days. Monday,Wednesday and Friday     HYDROcodone-acetaminophen (NORCO) 10-325 MG tablet Take 1 tablet by mouth every 6 (six) hours as needed for moderate pain. 12 tablet 0   multivitamin (RENA-VIT) TABS tablet Take 1 tablet by mouth daily. One daily     nitroGLYCERIN (NITROSTAT) 0.4 MG SL tablet Place 0.4 mg under the tongue every 5 (five) minutes as needed for chest pain.     Nutritional Supplements (,FEEDING SUPPLEMENT, PROSOURCE PLUS) liquid Take 30 mLs by mouth 2 (two) times daily between meals.     ondansetron (ZOFRAN)  4 MG tablet Take 4 mg by mouth daily.     pantoprazole (PROTONIX) 40 MG tablet Take 1 tablet (40 mg total) by mouth 2 (two) times daily. 60 tablet 3   No current facility-administered medications for this visit.    Allergies as of 02/21/2023 - Review Complete 02/21/2023  Allergen Reaction Noted   Cefepime Other (See Comments) 09/01/2021   Morphine Other (See Comments) 09/28/2021   Sulfa antibiotics Other (See Comments) 01/11/2014    Family History  Problem Relation Age of Onset   CAD Father    Heart attack Father    Diabetes Mellitus II Father    Hypertension Father    Lupus Brother     Social History   Socioeconomic History   Marital status: Single    Spouse name: Not on file   Number of children: Not on file   Years of education: Not on file   Highest education level: Not on file  Occupational History   Not on file  Tobacco Use   Smoking status: Never    Passive exposure: Never   Smokeless tobacco: Never  Vaping Use   Vaping status: Never Used  Substance and Sexual Activity   Alcohol use: No   Drug use: No   Sexual activity: Not Currently  Other Topics Concern   Not on file  Social History Narrative   Not on file   Social Determinants of Health   Financial Resource Strain: Not on file  Food Insecurity: No Food Insecurity (02/11/2023)   Hunger Vital Sign    Worried About Running Out of Food in the Last Year: Never true    Ran Out of Food in the Last Year: Never true  Transportation Needs: No Transportation Needs (02/11/2023)   PRAPARE - Administrator, Civil Service (Medical): No    Lack of Transportation (Non-Medical): No  Physical Activity: Not on file  Stress: Not on file  Social Connections: Not on file    Review of Systems: Gen: Denies fever, chills, cold or flulike symptoms, presyncope, syncope CV: Denies chest pain, palpitations. Resp: Denies dyspnea, cough. GI: See HPI Heme: See HPI  Physical Exam: BP 127/60 (BP Location: Right  Arm, Patient Position: Sitting, Cuff Size: Normal)   Pulse 64   Temp 97.8 F (36.6 C) (Temporal)   Ht 4\' 11"  (1.499 m)   Wt 94 lb 9.6 oz (42.9 kg)   SpO2 96%   BMI 19.11 kg/m  General:   Alert and oriented. No distress noted. Pleasant and cooperative.  Head:  Normocephalic and atraumatic. Eyes:  Conjuctiva clear without scleral icterus. Heart:  S1, S2 present without murmurs appreciated. Lungs:  Clear to auscultation bilaterally. No wheezes, rales, or rhonchi. No distress.  Abdomen:  +BS, soft, non-tender and non-distended. No rebound or guarding. No HSM or masses noted. Msk:  Symmetrical without gross deformities. Normal posture. Extremities:  Without edema. Neurologic:  Alert and  oriented x4 Psych:  Normal mood and affect.    Assessment:  82 y.o. female with a history of glomerulonephritis now with end-stage renal disease had been on PD for many years and transition to hemodialysis February 2023. She is on MWF treatments. Other pertinent history includes hypertension, hyperlipidemia, gastroparesis, anxiety/depression, GERD, gouty arthritis, anemia, diarrhea for the last few years, presenting today for follow-up vomiting. Also discussed weight loss, diarrhea, and anemia.   Vomiting without nausea/odynophagia/?  Dysphagia:  Patient reports postprandial vomiting without nausea as well as odynophagia and possible dysphagia with sensation of foods sticking in the very low esophagus/upper stomach.  Patient states she thinks the pain that she has with swallowing is what ends up causing her to vomit.  Right now, she is controlling her symptoms with Zofran which seems to be working fairly well, only having about 4 episodes of vomiting in the last 4 weeks.  Denies any typical heartburn symptoms.  Symptoms could be multifactorial.  Query symptoms related to gastroparesis versus distal esophageal web, ring, stricture, malignancy, Candida esophagitis.  Will pursue EGD for further evaluation.  For  now, I will increase pantoprazole to twice daily and have her continue scheduled Zofran.  Diarrhea:  Chronic, but reports some worsening recently.  Currently controlling her symptoms with Imodium.  She has been hospitalized twice and on antibiotics recently.  Also currently residing at [facility] rehabilitation.  She is at risk for infectious diarrhea and we will check stool studies to rule this out.  She was previously advised to have a colonoscopy to further evaluate her diarrhea in January 2024, but this was never completed.  We discussed this again today, but patient declined.  Prior celiac screen negative.  Gallbladder in situ.  Anemia:  Recent baseline hemoglobin had been in the 10-11 range, but began to decline slowly newly starting in June 2024, down to 6.8 on 02/11/2023.  She received 1 unit PRBCs with hemoglobin improved to 9.2 on 02/12/2023.  She received IV iron with dialysis and has been for quite some time.  Most recent iron panel 7/22 with iron 21 (L), saturation 18%, ferritin 286.  Denies overt GI bleeding or NSAID use.  She has been dealing with intermittent vomiting, odynophagia, diarrhea, and weight loss as discussed above.    Etiology of acute on chronic anemia isn't clear. May be driven by chronic disease and acute illness, but unable to rule out occult GI bleeding. Her last EGD was in 2022 and her last colonoscopy was in 2017 detailed in HPI.  We discussed pursuing EGD as well as colonoscopy, patient prefers to pursue EGD only at this time.  Weight loss: Etiology not entirely clear.  Seems that she had quite a bit of weight loss in 2023, over 30 pounds, but weight has been fluctuating in the 90s to low 100s for the last year.  We are planning on EGD in the near future to evaluate upper GI symptoms discussed above.  She does have chronic diarrhea and we have previously recommended colonoscopy.  This  was again discussed today, patient declined.  She does have CT A/P without contrast  on file from March 2024 with nothing to explain weight loss.   Plan:  Proceed with upper endoscopy with propofol by Dr. Levon Hedger in the near future. The risks, benefits, and alternatives have been discussed with the patient in detail. The patient states understanding and desires to proceed.  ASA 3 Increase pantoprazole to 40 mg twice daily Continue Zofran 4 mg every morning. C. difficile GDH and toxin A/B, GI path panel, Cryptosporidium, Giardia. Continue Imodium as needed. Follow-up after EGD.   Ermalinda Memos, PA-C [facility] Gastroenterology 02/21/2023   I have reviewed the note and agree with the APP's assessment as described in this progress note  Katrinka Blazing, MD Gastroenterology and Hepatology [facility] Gastroenterology

## 2023-02-21 ENCOUNTER — Encounter: Payer: Self-pay | Admitting: Gastroenterology

## 2023-02-21 ENCOUNTER — Ambulatory Visit (INDEPENDENT_AMBULATORY_CARE_PROVIDER_SITE_OTHER): Payer: Medicare Other | Admitting: Gastroenterology

## 2023-02-21 VITALS — BP 127/60 | HR 64 | Temp 97.8°F | Ht 59.0 in | Wt 94.6 lb

## 2023-02-21 DIAGNOSIS — R1111 Vomiting without nausea: Secondary | ICD-10-CM | POA: Diagnosis not present

## 2023-02-21 DIAGNOSIS — D509 Iron deficiency anemia, unspecified: Secondary | ICD-10-CM | POA: Diagnosis not present

## 2023-02-21 DIAGNOSIS — R634 Abnormal weight loss: Secondary | ICD-10-CM | POA: Diagnosis not present

## 2023-02-21 DIAGNOSIS — R1310 Dysphagia, unspecified: Secondary | ICD-10-CM

## 2023-02-21 DIAGNOSIS — R197 Diarrhea, unspecified: Secondary | ICD-10-CM

## 2023-02-21 MED ORDER — PANTOPRAZOLE SODIUM 40 MG PO TBEC: 40.0000 mg | DELAYED_RELEASE_TABLET | Freq: Two times a day (BID) | ORAL | 3 refills | Status: DC

## 2023-02-21 NOTE — Patient Instructions (Addendum)
We will arrange for you to have an upper endoscopy with possible stretching of your esophagus with Dr. Levon Hedger in the near future.   Increase pantoprazole to 40 mg twice daily 30 minuets before breakfast and dinner.   Continue taking Zofran 4 mg every morning as this is working fairly well for you. If vomiting increases let me know.   Have stool studies completed due to ongoing diarrhea. You will need to submit a loose/watery type stool.   You may continue to use imodium as needed.   We will follow-up with you in the office after your upper endoscopy.   It was great to see you again today!   Ermalinda Memos, PA-C [facility] Gastroenterology

## 2023-02-22 ENCOUNTER — Encounter: Payer: Self-pay | Admitting: Gastroenterology

## 2023-03-07 ENCOUNTER — Telehealth: Payer: Self-pay | Admitting: *Deleted

## 2023-03-07 NOTE — Telephone Encounter (Signed)
Noted  

## 2023-03-07 NOTE — Telephone Encounter (Signed)
Called to schedule pt for EGD +/- ED with Dr.Castaneda. Pt states that she has a lot going on right now and can't really schedule. She says when she gets to a point that she can schedule, she will call back. FYI

## 2023-03-11 IMAGING — MR MR ABDOMEN W/O CM
16 of 18 series · 44 of 48 positions shown · non-contrast
Comparison: Ultrasound from 11/24/2020.

CLINICAL DATA: Chronic kidney disease with pancreas lesion.

EXAM:
MRI ABDOMEN WITHOUT CONTRAST
TECHNIQUE: Multiplanar multisequence MR imaging was performed without the
administration of intravenous contrast.

[Series 3: cor haste · coronal · 6.0mm · 1.25mm/px · 3 of 26 slices shown]
[im 1/26]
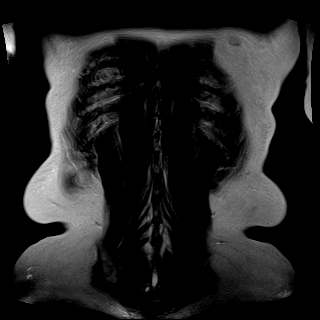
[im 13/26]
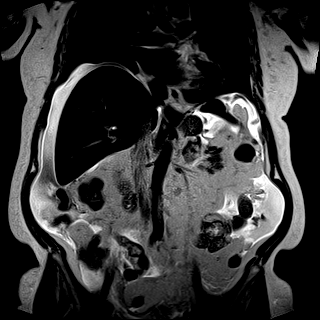
[im 26/26]
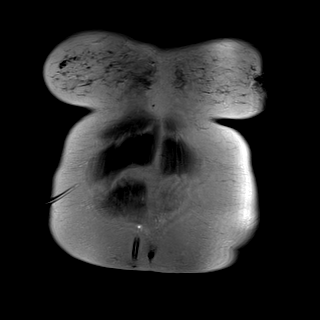

[Series 6: T2 fat-sat · axial · 6.0mm · 1.19mm/px · z∈[-57,+152]mm · 2 of 30 slices shown]
[im 1/30]
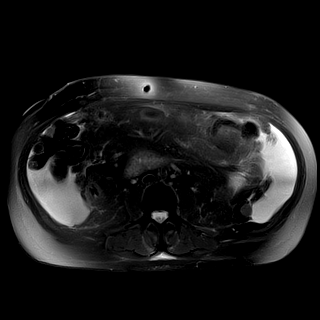
[im 30/30]
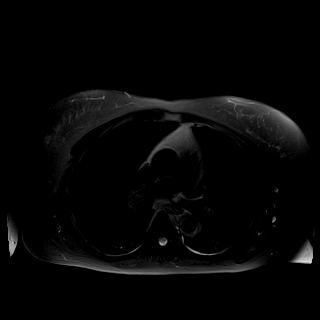

[Series 7: DWI · axial · 6.0mm · 1.42mm/px · z∈[-57,+152]mm · 2 of 30 slices shown (1 of 4)]
[im 1/30]
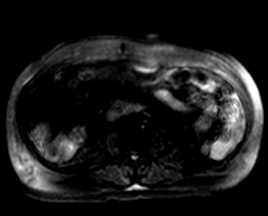
[im 30/30]
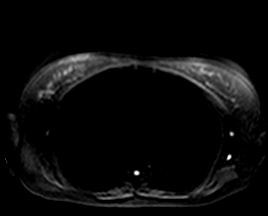

[Series 7: DWI · axial · 6.0mm · 1.42mm/px · z∈[-57,+152]mm · 2 of 30 slices shown (2 of 4)]
[im 1/30]
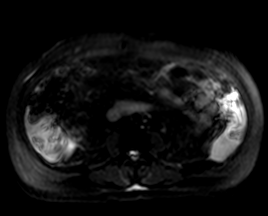
[im 30/30]
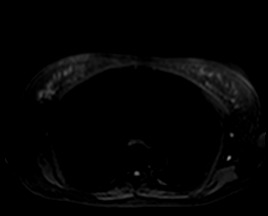

[Series 7: DWI · axial · 6.0mm · 1.42mm/px · z∈[-57,+152]mm · 2 of 30 slices shown (3 of 4)]
[im 1/30]
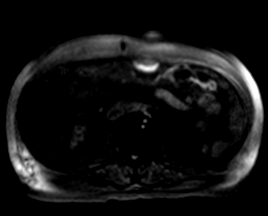
[im 30/30]
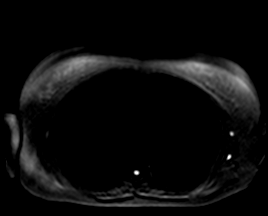

[Series 8: DWI · axial · 6.0mm · 1.42mm/px · z∈[-57,+152]mm · 2 of 30 slices shown (4 of 4)]
[im 1/30]
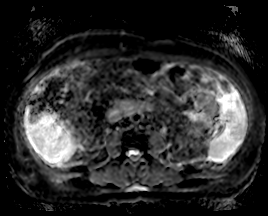
[im 30/30]
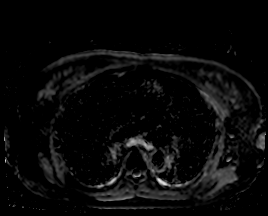

[Series 13: bSSFP · axial · 6.0mm · 0.74mm/px · z∈[-93,+189]mm · 3 of 48 slices shown]
[im 1/48]
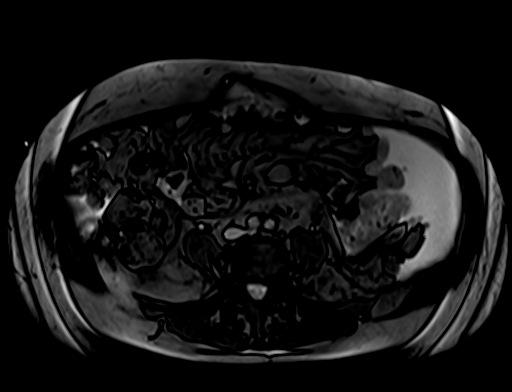
[im 24/48]
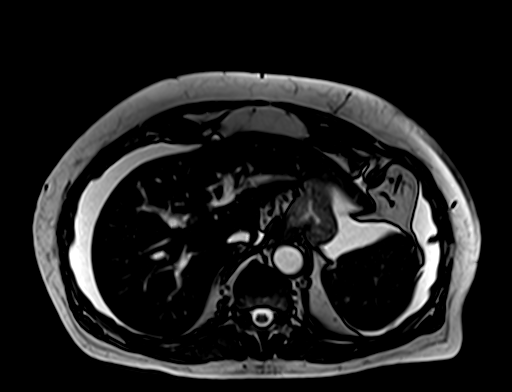
[im 48/48]
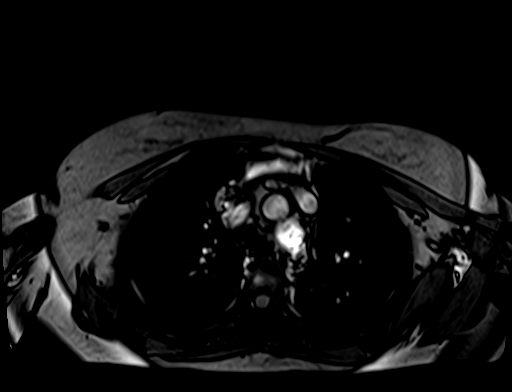

[Series 14: ax in and · axial · 3.0mm · 1.19mm/px · z∈[-59,+154]mm · 4 of 72 slices shown (1 of 4)]
[im 1/72]
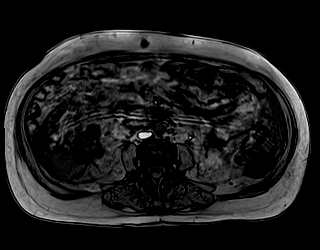
[im 24/72]
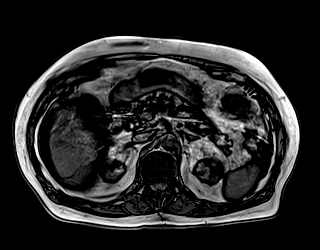
[im 48/72]
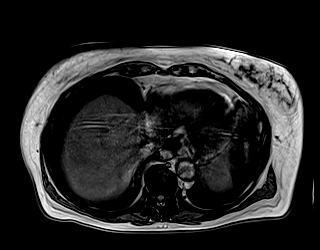
[im 72/72]
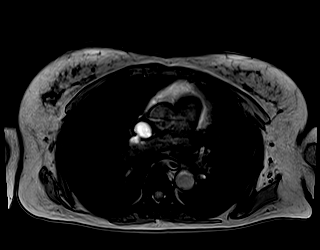

[Series 15: ax in and · axial · 3.0mm · 1.19mm/px · z∈[-59,+154]mm · 4 of 72 slices shown (2 of 4)]
[im 1/72]
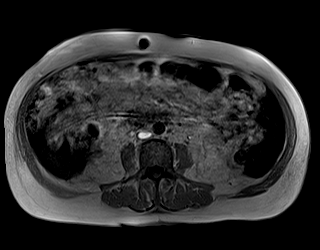
[im 24/72]
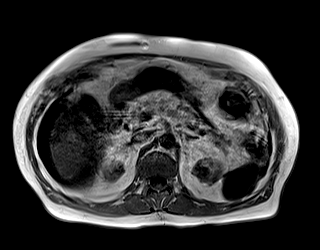
[im 48/72]
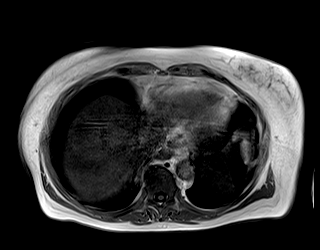
[im 72/72]
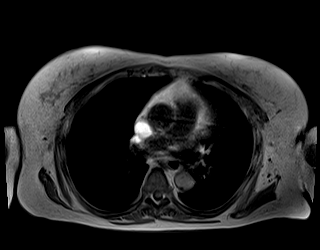

[Series 16: ax haste bh · axial · 6.0mm · 1.19mm/px · z∈[-57,+152]mm · 2 of 30 slices shown]
[im 1/30]
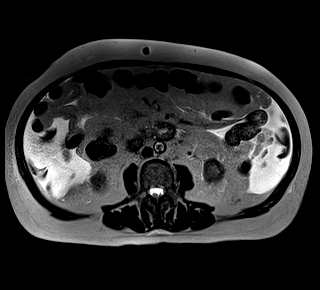
[im 30/30]
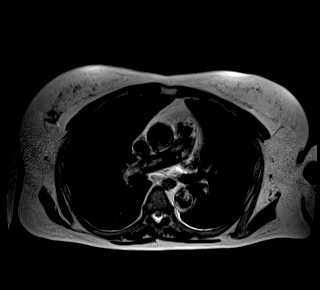

[Series 17: MRCP · coronal · 50.0mm · 0.78mm/px · 1 of 5 slices shown (1 of 2)]
[im 1/5]
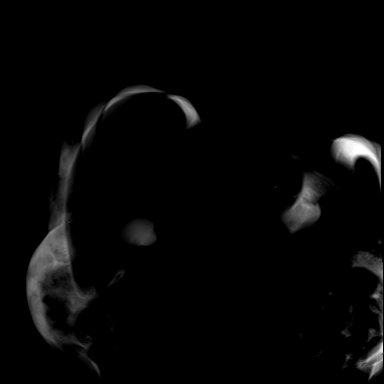

[Series 18: MRCP · coronal · 4.0mm · 1.12mm/px · 1 of 15 slices shown (2 of 2)]
[im 1/15]
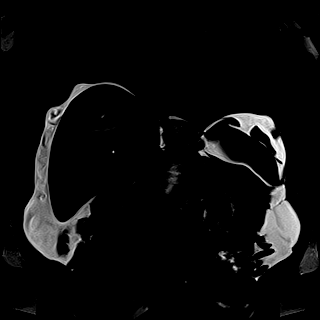

[Series 19: ax vibe_w · axial · 3.0mm · 1.19mm/px · z∈[-59,+154]mm · 4 of 72 slices shown]
[im 1/72]
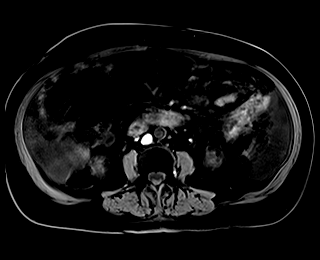
[im 24/72]
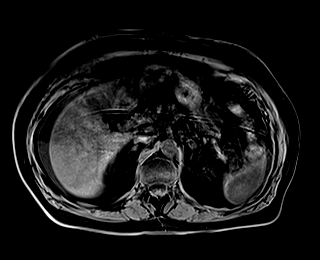
[im 48/72]
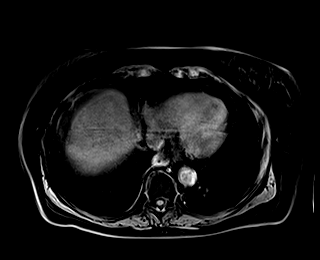
[im 72/72]
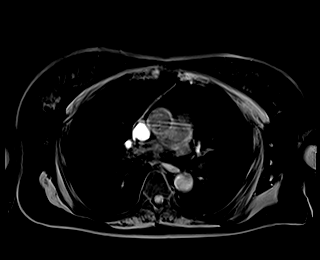

[Series 20: T1 dynamic · coronal · 3.0mm · 1.31mm/px · 4 of 72 slices shown]
[im 1/72]
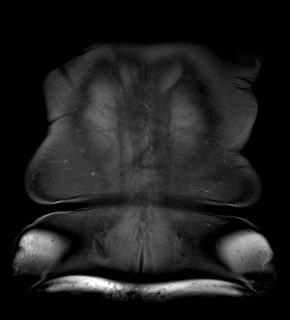
[im 24/72]
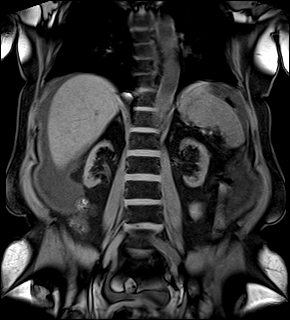
[im 48/72]
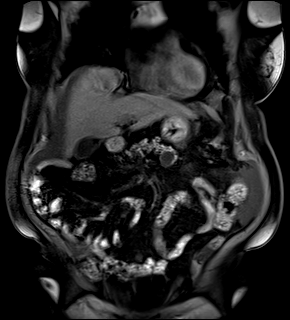
[im 72/72]
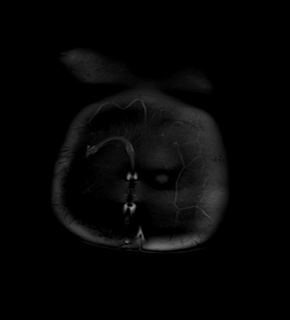

[Series 21: ax in and · axial · 3.0mm · 1.25mm/px · z∈[-59,+154]mm · 4 of 72 slices shown (3 of 4)]
[im 1/72]
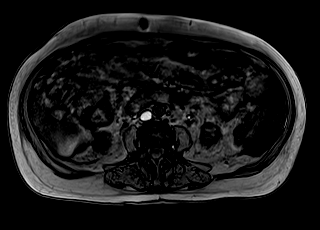
[im 24/72]
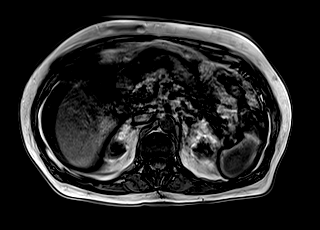
[im 48/72]
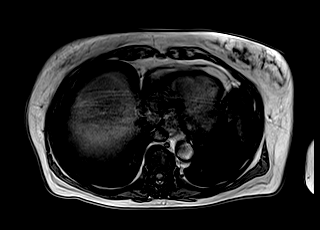
[im 72/72]
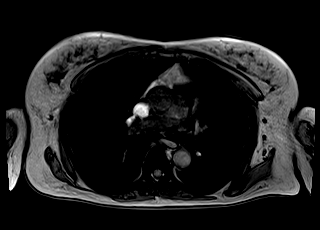

[Series 22: ax in and · axial · 3.0mm · 1.25mm/px · z∈[-59,+154]mm · 4 of 72 slices shown (4 of 4)]
[im 1/72]
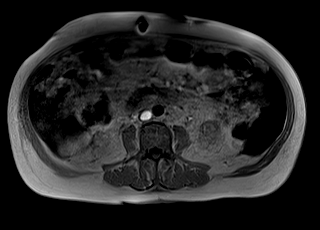
[im 24/72]
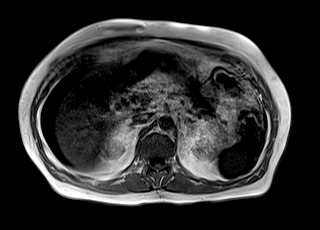
[im 48/72]
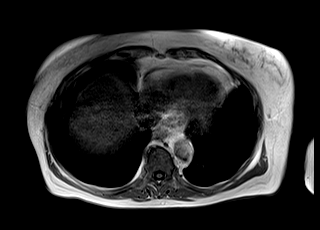
[im 72/72]
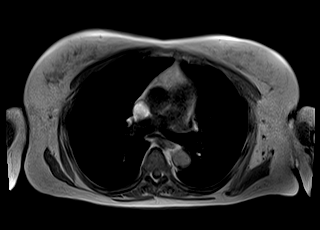

[44 of 48 positions shown; findings below may reference images not displayed]

FINDINGS: Lower chest: No acute findings.

Hepatobiliary: No mass or other parenchymal abnormality identified.
No gallstones, gallbladder wall thickening or inflammation. No bile
duct dilatation.

Pancreas: No pancreatic inflammation or main duct dilatation.
Unilocular cystic lesion arising from the proximal tail of pancreas
measures 2 cm, image [DATE]. No internal septation or mural nodule
identified.

Spleen:  Within normal limits in size and appearance.

Adrenals/Urinary Tract: Normal appearance of the adrenal glands.
Bilateral renal atrophy compatible with the clinical history of
chronic medical renal disease. Cyst within upper pole of the left
kidney measures 1.2 cm, image [DATE]. Several subcentimeter cysts
noted within the mid and inferior pole of right kidney.

Stomach/Bowel: Visualized portions within the abdomen are
unremarkable.

Vascular/Lymphatic: No pathologically enlarged lymph nodes
identified. No abdominal aortic aneurysm demonstrated. No
adenopathy.

Other: There is moderate ascites identified within the abdomen. No
discrete fluid collections.

Musculoskeletal: No suspicious bone lesions identified.
IMPRESSION: 1. Unilocular cystic lesion arising from the proximal tail of
pancreas measures 2 cm. No internal septation or mural nodule
identified. This is favored to represent a benign process such as a
serous cystadenoma or side branch IPMN. According to consensus
criteria follow-up imaging in 24 months with pancreas protocol MRI
without and with contrast material is advised. This recommendation
follows ACR consensus guidelines: Management of Incidental
Pancreatic Cysts: A White Paper of the ACR Incidental Findings
Committee. [HOSPITAL] [id number];[DATE].
2. Bilateral renal atrophy compatible with the clinical history of
chronic medical renal disease.

## 2023-03-22 ENCOUNTER — Encounter: Payer: Self-pay | Admitting: *Deleted

## 2023-03-22 NOTE — Telephone Encounter (Signed)
Pt left vm wanting to schedule procedure.  Called pt to schedule. She states that her son is asleep and she needs to get with him to see which day would be best. Gave 9/10, 9/17 & 9/24. Pt says she will check with son and give [place] a call back.

## 2023-03-22 NOTE — Telephone Encounter (Signed)
Pt called back and is scheduled for 04/02/23. Instructions sent via MyChart.

## 2023-03-26 ENCOUNTER — Encounter: Payer: Self-pay | Admitting: *Deleted

## 2023-03-29 ENCOUNTER — Other Ambulatory Visit: Payer: Self-pay

## 2023-03-29 ENCOUNTER — Encounter (HOSPITAL_COMMUNITY): Payer: Self-pay

## 2023-03-29 ENCOUNTER — Encounter (HOSPITAL_COMMUNITY)
Admission: RE | Admit: 2023-03-29 | Discharge: 2023-03-29 | Disposition: A | Discharge status: Home / Self Care | Payer: Medicare Other | Source: Ambulatory Visit | Attending: Gastroenterology

## 2023-03-29 VITALS — Ht 59.0 in | Wt 96.0 lb

## 2023-03-29 DIAGNOSIS — N186 End stage renal disease: Secondary | ICD-10-CM

## 2023-04-02 ENCOUNTER — Encounter (HOSPITAL_COMMUNITY): Payer: Self-pay | Admitting: Certified Registered"

## 2023-04-02 ENCOUNTER — Encounter (HOSPITAL_COMMUNITY): Admission: RE | Payer: Self-pay | Source: Home / Self Care

## 2023-04-02 ENCOUNTER — Telehealth (INDEPENDENT_AMBULATORY_CARE_PROVIDER_SITE_OTHER): Payer: Self-pay | Admitting: Gastroenterology

## 2023-04-02 ENCOUNTER — Ambulatory Visit (HOSPITAL_COMMUNITY): Admission: RE | Admit: 2023-04-02 | Payer: Medicare Other | Source: Home / Self Care | Admitting: Gastroenterology

## 2023-04-02 SURGERY — ESOPHAGOGASTRODUODENOSCOPY (EGD) WITH PROPOFOL
Anesthesia: Monitor Anesthesia Care

## 2023-04-02 NOTE — Telephone Encounter (Signed)
Pt left voicemail that she was unable to make it to her EGD today due to unforeseen circumstances. Returned call to patient to get her rescheduled. Pt has dialysis M,W,F; can only come on Tues or Thursday. Pt advised I would have to call her when I have October schedule for Dr.Castaneda.

## 2023-04-02 NOTE — OR Nursing (Signed)
No show, called patient and stated that she tried to cancel this procedure and wants to reschedule. She will call office to reschedule.

## 2023-04-18 ENCOUNTER — Ambulatory Visit: Payer: Medicare Other

## 2023-04-18 VITALS — BP 159/61 | HR 84 | Temp 98.3°F | Resp 18 | Ht 59.0 in | Wt 95.0 lb

## 2023-04-18 DIAGNOSIS — N186 End stage renal disease: Secondary | ICD-10-CM | POA: Diagnosis not present

## 2023-04-18 DIAGNOSIS — Z992 Dependence on renal dialysis: Secondary | ICD-10-CM | POA: Diagnosis not present

## 2023-04-18 NOTE — Progress Notes (Signed)
HISTORY AND PHYSICAL     CC:  dialysis access Requesting Provider:  Rebekah Chesterfield, NP  HPI: This is a 82 y.o. female here for evaluation of her hemodialysis access.    Pt has hx of LUA AVG by Dr. Arbie Cookey on 03/20/2022.    She states that her graft has worked well but a couple of weeks ago, she feels they stuck it and it bled and she developed a hematoma over the graft.  She has a small area over the graft that she and her son state have bled the past couple of days but they feel like it is the hematoma being pushed out.  He describes the bleeding as dark blood and it was not bright red.  It was just a ooze and not diffuse bleeding.  Her son Channing Mutters feels it is looking better every day.  She states her graft is working well on HD without issues.  They are sticking above and below the area of concern.   The pt is right hand dominant.    Pt is on dialysis.    HD center:  [facility] in [place].     The pt is not on a statin for cholesterol management.  The pt is not on a daily aspirin.  Other AC:  none The pt is on BB, hydralazine for hypertension.  The pt is not on medication for diabetes.   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 Karilyn Cota.   GERD (gastroesophageal reflux disease)    Glomerulonephritis    Gout    Mixed hyperlipidemia    Spondylosis     Past Surgical History:  Procedure Laterality Date   APPENDECTOMY     AV FISTULA PLACEMENT Left 03/20/2022   Procedure: INSERTION OF LEFT UPPER ARM ARTERIOVENOUS (AV) GORE-TEX GRAFT;  Surgeon: Larina Earthly, MD;  Location: [facility];  Service: Vascular;  Laterality: Left;   CATARACT EXTRACTION Bilateral    COLONOSCOPY  N/A 12/28/2015   Procedure: COLONOSCOPY;  Surgeon: Malissa Hippo, MD;  [facility];  Service: Endoscopy;  Laterality: N/A;  815   ESOPHAGOGASTRODUODENOSCOPY N/A 11/16/2020   Procedure: ESOPHAGOGASTRODUODENOSCOPY (EGD);  Surgeon: Malissa Hippo, MD;  [facility];  Service: Endoscopy;  Laterality: N/A;  1:15   EXCHANGE OF A DIALYSIS CATHETER N/A 03/20/2022   Procedure: EXCHANGE OF A DIALYSIS CATHETER;  Surgeon: Larina Earthly, MD;  Location: [facility];  Service: Vascular;  Laterality: N/A;   INSERTION OF DIALYSIS CATHETER Right 09/30/2021   Procedure: INSERTION OF TUNNELED DIALYSIS CATHETER;  Surgeon: Maeola Harman, MD;  Location: [facility] OR;  Service: Vascular;  Laterality: Right;   IR FLUORO GUIDE CV LINE RIGHT  01/16/2019   IR REMOVAL TUN CV CATH W/O FL  07/01/2019   IR THROMBECTOMY AV FISTULA W/THROMBOLYSIS/PTA INC/SHUNT/IMG LEFT Left 02/01/2023   IR US GUIDE VASC ACCESS LEFT  02/01/2023   IR US GUIDE VASC ACCESS RIGHT  01/16/2019   POLYPECTOMY  11/16/2020   Procedure: POLYPECTOMY;  Surgeon: Malissa Hippo, MD;  [facility];  Service: Endoscopy;;  gastric  REMOVAL OF A DIALYSIS CATHETER  09/30/2021   Procedure: REMOVAL OF A PERITONEAL DIALYSIS CATHETER;  Surgeon: Maeola Harman, MD;  Location: [facility] OR;  Service: Vascular;;   REMOVAL OF A DIALYSIS CATHETER N/A 05/22/2022   Procedure: MINOR REMOVAL OF A TUNNELED DIALYSIS CATHETER;  Surgeon: Larina Earthly, MD;  Location: [facility];  Service: Vascular;  Laterality: N/A;   TUBAL LIGATION      Allergies  Allergen Reactions   Cefepime Other (See Comments)    Feb 2023 Encephalopathy with questionable seizures. Seems to be tolerating cefazolin , but the daughter mentioned not to start her on any cephalosporin     Morphine Other (See Comments)    "Dry heaving like crazy"   Sulfa Antibiotics Other (See Comments)    Shut pt's kidneys down    Current Outpatient Medications  Medication Sig  Dispense Refill   acetaminophen (TYLENOL) 325 MG tablet Take 2 tablets (650 mg total) by mouth every 6 (six) hours as needed for mild pain.     carvedilol (COREG) 12.5 MG tablet Take 1 tablet by mouth 2 (two) times daily.     famotidine (PEPCID) 40 MG tablet Take 1 tablet (40 mg total) by mouth at bedtime. (Patient taking differently: Take 40 mg by mouth as needed for heartburn or indigestion.) 30 tablet 1   hydrALAZINE (APRESOLINE) 50 MG tablet Take 50 mg by mouth 2 (two) times daily. Do not take morning dose on Dialysis days. Monday,Wednesday and Friday     HYDROcodone-acetaminophen (NORCO) 10-325 MG tablet Take 1 tablet by mouth every 6 (six) hours as needed for moderate pain. 12 tablet 0   multivitamin (RENA-VIT) TABS tablet Take 1 tablet by mouth daily. One daily     nitroGLYCERIN (NITROSTAT) 0.4 MG SL tablet Place 0.4 mg under the tongue every 5 (five) minutes as needed for chest pain.     Nutritional Supplements (,FEEDING SUPPLEMENT, PROSOURCE PLUS) liquid Take 30 mLs by mouth 2 (two) times daily between meals.     ondansetron (ZOFRAN) 4 MG tablet Take 4 mg by mouth daily.     pantoprazole (PROTONIX) 40 MG tablet Take 1 tablet (40 mg total) by mouth 2 (two) times daily. 60 tablet 3   No current facility-administered medications for this visit.    Family History  Problem Relation Age of Onset   CAD Father    Heart attack Father    Diabetes Mellitus II Father    Hypertension Father    Lupus Brother     Social History   Socioeconomic History   Marital status: Single    Spouse name: Not on file   Number of children: Not on file   Years of education: Not on file   Highest education level: Not on file  Occupational History   Not on file  Tobacco Use   Smoking status: Never    Passive exposure: Never   Smokeless tobacco: Never  Vaping Use   Vaping status: Never Used  Substance and Sexual Activity   Alcohol use: No   Drug use: No   Sexual activity: Not Currently  Other  Topics Concern   Not on file  Social History Narrative   Not on file   Social Determinants of Health   Financial Resource Strain: Not on file  Food Insecurity: No Food Insecurity (02/11/2023)   Hunger Vital Sign    Worried About Running Out of Food in the Last Year: Never true    Ran Out of Food in  the Last Year: Never true  Transportation Needs: No Transportation Needs (02/11/2023)   PRAPARE - Administrator, Civil Service (Medical): No    Lack of Transportation (Non-Medical): No  Physical Activity: Not on file  Stress: Not on file  Social Connections: Not on file  Intimate Partner Violence: Not At Risk (02/11/2023)   Humiliation, Afraid, Rape, and Kick questionnaire    Fear of Current or Ex-Partner: No    Emotionally Abused: No    Physically Abused: No    Sexually Abused: No     ROS: [x]  Positive   [ ]  Negative   [ ]  All sytems reviewed and are negative  Cardiac: []  chest pain/pressure []  SOB/DOE  Vascular: []  pain in legs while walking []  pain in feet when lying flat []  swelling in legs  Pulmonary: []  asthma []  wheezing  Neurologic: []  hx CVA/TIA  Hematologic: [x]  anemia   GI [x]  GERD  GU: [x]  CKD/renal failure  [x]  HD  Psychiatric: [x]  hx of depression  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    General:  WDWN female in NAD Gait: Not observed HENT: WNL Pulmonary: normal non-labored breathing  Cardiac: regular Skin: without rashes Vascular Exam/Pulses:   Right Left  Radial 2+ (normal) 2+ (normal)  AT 2+ (normal) 2+ (normal)   Extremities:  LUA AVG with excellent thrill.  Able to pinch skin at scab over the graft.   Musculoskeletal: no  muscle wasting or atrophy  Neurologic: A&O X 3    ASSESSMENT/PLAN: 82 y.o. female with ESRD here for evaluation of her hemodialysis access with hx of LUA AVG 2023 by Dr. Arbie Cookey   -pt has palpable left radial pulse -there was concern about hematoma and bleeding-pt and son state this is much better.  It did ooze last night and pt and son state that it was dark in color and just an ooze.  It was not bright red.  Feel this is probably old blood.  I could pinch the skin over the graft and do not feel ulceration is on the graft and most likely infiltrated.  Advised her to have them stick above and below the area until it is fully healed.   -discussed how to hold pressure should her graft ever have significant bleeding and call 911.  -discussed with pt that access does not last forever and will need intervention or even new access at some point.  -she will f/u as needed.   -she does have some swelling in her legs.  She has easily palpable AT pulses bilaterally.  Discussed she could use compression if this is bothersome to her.    Doreatha Massed, [facility] [phone]  Clinic MD:   Lenell Antu on call MD

## 2023-04-24 ENCOUNTER — Telehealth: Payer: Self-pay

## 2023-04-24 NOTE — Telephone Encounter (Signed)
Caller: Joy @ [employer]  Concern: Dr. Wolfgang Phoenix rounded on pt while at treatment and requests that she been seen again. Pt is not any better and is still having bleeding/oozing at access. The bleeding is not constant, but yesterday it got all over the sleeve of her shirt. The skin is thin and tender.  Location: left arm  Resolution: Appointment scheduled for available MD appt  Next Appt: Appointment scheduled for 10/3 @ 1040

## 2023-04-25 ENCOUNTER — Encounter: Payer: Self-pay | Admitting: Vascular Surgery

## 2023-04-25 ENCOUNTER — Ambulatory Visit (INDEPENDENT_AMBULATORY_CARE_PROVIDER_SITE_OTHER): Payer: Medicare Other | Admitting: Vascular Surgery

## 2023-04-25 ENCOUNTER — Other Ambulatory Visit: Payer: Self-pay

## 2023-04-25 VITALS — BP 157/63 | HR 69 | Temp 98.2°F | Resp 20 | Ht 59.0 in | Wt 95.0 lb

## 2023-04-25 DIAGNOSIS — T82590A Other mechanical complication of surgically created arteriovenous fistula, initial encounter: Secondary | ICD-10-CM

## 2023-04-25 DIAGNOSIS — N186 End stage renal disease: Secondary | ICD-10-CM

## 2023-04-25 NOTE — Telephone Encounter (Signed)
Contacted pt to see if she would like to get rescheduled. Pt was on the road and I advised her I would call her back this afternoon. (EGD +/- ED [facility] 3. Tuesday or Thursday only; pt is dialysis pt)

## 2023-04-25 NOTE — Progress Notes (Signed)
Office Note     CC: Drainage at left arm AV graft access site Requesting Provider:  Rebekah Chesterfield, NP  HPI: Felicia Williams is a 82 y.o. (01-12-1941) female presenting at the request of .Felicia Chesterfield, NP to evaluate drainage at left arm AV graft access site.  On exam, Felicia Williams was doing well, accompanied by her son.  She has previous history of left arm AV graft placement by Dr. Arbie Cookey in 2023.  This access site has been working well.  Recently the graft was infiltrated leading to hematoma formation.  The hematoma has resolved, however there continues to be a small amount of drainage from one of the percutaneous access sites.  Felicia Williams denies fevers, chills.  Denies erythema surrounding the site. Denies symptoms associated with steal syndrome.  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 Karilyn Cota.   GERD (gastroesophageal reflux disease)    Glomerulonephritis    Gout    Mixed hyperlipidemia    Spondylosis     Past Surgical History:  Procedure Laterality Date   APPENDECTOMY     AV FISTULA PLACEMENT Left 03/20/2022   Procedure: INSERTION OF LEFT UPPER ARM ARTERIOVENOUS (AV) GORE-TEX GRAFT;  Surgeon: Larina Earthly, MD;  Location: [facility];  Service: Vascular;  Laterality: Left;   CATARACT EXTRACTION Bilateral    COLONOSCOPY N/A 12/28/2015   Procedure: COLONOSCOPY;  Surgeon: Malissa Hippo, MD;  [facility];  Service: Endoscopy;  Laterality: N/A;  815   ESOPHAGOGASTRODUODENOSCOPY N/A 11/16/2020   Procedure: ESOPHAGOGASTRODUODENOSCOPY (EGD);  Surgeon: Malissa Hippo, MD;  [facility];  Service: Endoscopy;  Laterality: N/A;   1:15   EXCHANGE OF A DIALYSIS CATHETER N/A 03/20/2022   Procedure: EXCHANGE OF A DIALYSIS CATHETER;  Surgeon: Larina Earthly, MD;  Location: [facility];  Service: Vascular;  Laterality: N/A;   INSERTION OF DIALYSIS CATHETER Right 09/30/2021   Procedure: INSERTION OF TUNNELED DIALYSIS CATHETER;  Surgeon: Maeola Harman, MD;  Location: [facility] OR;  Service: Vascular;  Laterality: Right;   IR FLUORO GUIDE CV LINE RIGHT  01/16/2019   IR REMOVAL TUN CV CATH W/O FL  07/01/2019   IR THROMBECTOMY AV FISTULA W/THROMBOLYSIS/PTA INC/SHUNT/IMG LEFT Left 02/01/2023   IR US GUIDE VASC ACCESS LEFT  02/01/2023   IR US GUIDE VASC ACCESS RIGHT  01/16/2019   POLYPECTOMY  11/16/2020   Procedure: POLYPECTOMY;  Surgeon: Malissa Hippo, MD;  [facility];  Service: Endoscopy;;  gastric   REMOVAL OF A DIALYSIS CATHETER  09/30/2021   Procedure: REMOVAL OF A PERITONEAL DIALYSIS CATHETER;  Surgeon: Maeola Harman, MD;  Location: [facility] OR;  Service: Vascular;;   REMOVAL OF A DIALYSIS CATHETER N/A 05/22/2022   Procedure: MINOR REMOVAL OF A TUNNELED DIALYSIS CATHETER;  Surgeon: Larina Earthly, MD;  Location: [facility];  Service: Vascular;  Laterality: N/A;   TUBAL LIGATION      Social History   Socioeconomic History   Marital status: Single    Spouse name: Not on file   Number of children: Not on file   Years of education: Not  on file   Highest education level: Not on file  Occupational History   Not on file  Tobacco Use   Smoking status: Never    Passive exposure: Never   Smokeless tobacco: Never  Vaping Use   Vaping status: Never Used  Substance and Sexual Activity   Alcohol use: No   Drug use: No   Sexual activity: Not Currently  Other Topics Concern   Not on file  Social History Narrative   Not on file   Social Determinants of Health   Financial Resource Strain: Not on file  Food Insecurity: No Food Insecurity (02/11/2023)   Hunger Vital Sign    Worried About Running Out of  Food in the Last Year: Never true    Ran Out of Food in the Last Year: Never true  Transportation Needs: No Transportation Needs (02/27/2023)   Received from [facility]   PRAPARE - Transportation    Lack of Transportation (Medical): No    Lack of Transportation (Non-Medical): No  Physical Activity: Not on file  Stress: Not on file  Social Connections: Not on file  Intimate Partner Violence: Not At Risk (02/11/2023)   Humiliation, Afraid, Rape, and Kick questionnaire    Fear of Current or Ex-Partner: No    Emotionally Abused: No    Physically Abused: No    Sexually Abused: No   Family History  Problem Relation Age of Onset   CAD Father    Heart attack Father    Diabetes Mellitus II Father    Hypertension Father    Lupus Brother     Current Outpatient Medications  Medication Sig Dispense Refill   acetaminophen (TYLENOL) 325 MG tablet Take 2 tablets (650 mg total) by mouth every 6 (six) hours as needed for mild pain.     carvedilol (COREG) 12.5 MG tablet Take 1 tablet by mouth 2 (two) times daily.     famotidine (PEPCID) 40 MG tablet Take 1 tablet (40 mg total) by mouth at bedtime. (Patient taking differently: Take 40 mg by mouth as needed for heartburn or indigestion.) 30 tablet 1   hydrALAZINE (APRESOLINE) 50 MG tablet Take 50 mg by mouth 2 (two) times daily. Do not take morning dose on Dialysis days. Monday,Wednesday and Friday     HYDROcodone-acetaminophen (NORCO) 10-325 MG tablet Take 1 tablet by mouth every 6 (six) hours as needed for moderate pain. 12 tablet 0   multivitamin (RENA-VIT) TABS tablet Take 1 tablet by mouth daily. One daily     nitroGLYCERIN (NITROSTAT) 0.4 MG SL tablet Place 0.4 mg under the tongue every 5 (five) minutes as needed for chest pain.     Nutritional Supplements (,FEEDING SUPPLEMENT, PROSOURCE PLUS) liquid Take 30 mLs by mouth 2 (two) times daily between meals.     ondansetron (ZOFRAN) 4 MG tablet Take 4 mg by mouth daily.     pantoprazole  (PROTONIX) 40 MG tablet Take 1 tablet (40 mg total) by mouth 2 (two) times daily. 60 tablet 3   No current facility-administered medications for this visit.    Allergies  Allergen Reactions   Cefepime Other (See Comments)    Feb 2023 Encephalopathy with questionable seizures. Seems to be tolerating cefazolin , but the daughter mentioned not to start her on any cephalosporin     Morphine Other (See Comments)    "Dry heaving like crazy"   Sulfa Antibiotics Other (See Comments)    Shut pt's kidneys down     REVIEW OF SYSTEMS:  [  X] denotes positive finding, [ ]  denotes negative finding Cardiac  Comments:  Chest pain or chest pressure:    Shortness of breath upon exertion:    Short of breath when lying flat:    Irregular heart rhythm:        Vascular    Pain in calf, thigh, or hip brought on by ambulation:    Pain in feet at night that wakes you up from your sleep:     Blood clot in your veins:    Leg swelling:         Pulmonary    Oxygen at home:    Productive cough:     Wheezing:         Neurologic    Sudden weakness in arms or legs:     Sudden numbness in arms or legs:     Sudden onset of difficulty speaking or slurred speech:    Temporary loss of vision in one eye:     Problems with dizziness:         Gastrointestinal    Blood in stool:     Vomited blood:         Genitourinary    Burning when urinating:     Blood in urine:        Psychiatric    Major depression:         Hematologic    Bleeding problems:    Problems with blood clotting too easily:        Skin    Rashes or ulcers:        Constitutional    Fever or chills:      PHYSICAL EXAMINATION:  Vitals:   04/25/23 1047  BP: (!) 157/63  Pulse: 69  Resp: 20  Temp: 98.2 F (36.8 C)  SpO2: 96%  Weight: 95 lb (43.1 kg)  Height: 4\' 11"  (1.499 m)    General:  WDWN in NAD; vital signs documented above Gait: Not observed HENT: WNL, normocephalic Pulmonary: normal non-labored breathing ,  without wheezing Cardiac: regular HR Abdomen: soft, NT, no masses Skin: without rashes Vascular Exam/Pulses:  Right Left  Radial 2+ (normal) 2+ (normal)                       Extremities: without ischemic changes, without Gangrene , without cellulitis;  Left arm AV graft with mild pulsatility, thrill present.  Small percutaneous access site with a small amount of drainage with compression.  No purulence. Musculoskeletal: no muscle wasting or atrophy  Neurologic: A&O X 3;  No focal weakness or paresthesias are detected Psychiatric:  The pt has Normal affect.      ASSESSMENT/PLAN: Analaura Bester is a 82 y.o. female presenting with concern for infected percutaneous access site of left arm AV graft. On inspection, one of the percutaneous access sites had fluid drainage when compressed.  I am concerned that without resection, this could involve the graft, and may well already involve the graft.  I am hopeful that this is a superficial lesion that can be resected.  I think if the graft were infected, she would have systemic symptoms -she has none.  Felicia Williams and I discussed resection of the eschar which has underlying drainage.  Should this involve the graft, we would discuss next steps following the procedure.  My plan is to make the resection small so the fistula can still be accessed.  After discussing the risks and benefits, Felicia Williams elected to proceed.  Felicia Sparrow, MD [facility] [phone] Total time of patient care including pre-visit research, consultation, and documentation greater than 30 minutes

## 2023-04-29 ENCOUNTER — Other Ambulatory Visit: Payer: Self-pay

## 2023-04-29 ENCOUNTER — Encounter (HOSPITAL_COMMUNITY): Payer: Self-pay | Admitting: Vascular Surgery

## 2023-04-29 NOTE — Progress Notes (Signed)
PCP - Rebekah Chesterfield, NP Cardiologist - Mallipeddi, Orion Modest, MD   PPM/ICD - denies Device Orders - n/a Rep Notified - n/a  Chest x-ray - 02-10-23 EKG - 02-11-23 Stress Test - 08-02-22 ECHO - 02-11-23 Cardiac Cath -   CPAP - denies  DM -denies  Blood Thinner Instructions: denies Aspirin Instructions: n/a  ERAS Protcol - NPO  COVID TEST- n/a  Anesthesia review: yes HX ESRD, HTN, 02 2l via [place]  Patient verbally denies any shortness of breath, fever, cough and chest pain during phone call   -------------  SDW INSTRUCTIONS given:  Your procedure is scheduled on April 30, 2023.  Report to [facility] Main Entrance "A" at 5:30 a.m. A.M., and check in at the Admitting office.  Call this number if you have problems the morning of surgery:  [phone]   Remember:  Do not eat Or drink  after midnight the night before your surgery    Take these medicines the morning of surgery with A SIP OF WATER  hydrALAZINE (APRESOLINE) carvedilol (COREG)   IF NEEDED  nitroGLYCERIN (NITROSTAT)  oxyCODONE (OXY IR/ROXICODONE)   As of today, STOP taking any Aspirin (unless otherwise instructed by your surgeon) Aleve, Naproxen, Ibuprofen, Motrin, Advil, Goody's, BC's, all herbal medications, fish oil, and all vitamins.                      Do not wear jewelry, make up, or nail polish            Do not wear lotions, powders, perfumes/colognes, or deodorant.            Do not shave 48 hours prior to surgery.  Men may shave face and neck.            Do not bring valuables to the hospital.            [facility] is not responsible for any belongings or valuables.  Do NOT Smoke (Tobacco/Vaping) 24 hours prior to your procedure If you use a CPAP at night, you may bring all equipment for your overnight stay.   Contacts, glasses, dentures or bridgework may not be worn into surgery.      For patients admitted to the hospital, discharge time will be determined by your treatment team.    Patients discharged the day of surgery will not be allowed to drive home, and someone needs to stay with them for 24 hours.    Special instructions:   [place]- Preparing For Surgery  Before surgery, you can play an important role. Because skin is not sterile, your skin needs to be as free of germs as possible. You can reduce the number of germs on your skin by washing with CHG (chlorahexidine gluconate) Soap before surgery.  CHG is an antiseptic cleaner which kills germs and bonds with the skin to continue killing germs even after washing.    Oral Hygiene is also important to reduce your risk of infection.  Remember - BRUSH YOUR TEETH THE MORNING OF SURGERY WITH YOUR REGULAR TOOTHPASTE  Please do not use if you have an allergy to CHG or antibacterial soaps. If your skin becomes reddened/irritated stop using the CHG.  Do not shave (including legs and underarms) for at least 48 hours prior to first CHG shower. It is OK to shave your face.  Please follow these instructions carefully.   Shower the NIGHT BEFORE SURGERY and the MORNING OF SURGERY with DIAL Soap.   Dennie Bible  yourself dry with a CLEAN TOWEL.  Wear CLEAN PAJAMAS to bed the night before surgery  Place CLEAN SHEETS on your bed the night of your first shower and DO NOT SLEEP WITH PETS.   Day of Surgery: Please shower morning of surgery  Wear Clean/Comfortable clothing the morning of surgery Do not apply any deodorants/lotions.   Remember to brush your teeth WITH YOUR REGULAR TOOTHPASTE.   Questions were answered. Patient verbalized understanding of instructions.

## 2023-04-30 ENCOUNTER — Other Ambulatory Visit: Payer: Self-pay

## 2023-04-30 ENCOUNTER — Encounter (HOSPITAL_COMMUNITY)
Admission: RE | Disposition: A | Discharge status: Home / Self Care | Payer: Self-pay | Source: Home / Self Care | Attending: Vascular Surgery

## 2023-04-30 ENCOUNTER — Encounter (HOSPITAL_COMMUNITY): Payer: Self-pay | Admitting: Vascular Surgery

## 2023-04-30 ENCOUNTER — Ambulatory Visit (HOSPITAL_COMMUNITY): Payer: Medicare Other | Admitting: Anesthesiology

## 2023-04-30 ENCOUNTER — Inpatient Hospital Stay (HOSPITAL_COMMUNITY)
Admission: RE | Admit: 2023-04-30 | Discharge: 2023-05-02 | DRG: 252 | Disposition: A | Discharge status: Home / Self Care | Payer: Medicare Other | Attending: Vascular Surgery | Admitting: Vascular Surgery

## 2023-04-30 ENCOUNTER — Ambulatory Visit (HOSPITAL_COMMUNITY): Payer: Medicare Other

## 2023-04-30 DIAGNOSIS — K3184 Gastroparesis: Secondary | ICD-10-CM | POA: Diagnosis present

## 2023-04-30 DIAGNOSIS — Z881 Allergy status to other antibiotic agents status: Secondary | ICD-10-CM

## 2023-04-30 DIAGNOSIS — J9611 Chronic respiratory failure with hypoxia: Secondary | ICD-10-CM | POA: Diagnosis not present

## 2023-04-30 DIAGNOSIS — Z9981 Dependence on supplemental oxygen: Secondary | ICD-10-CM

## 2023-04-30 DIAGNOSIS — M898X9 Other specified disorders of bone, unspecified site: Secondary | ICD-10-CM | POA: Diagnosis present

## 2023-04-30 DIAGNOSIS — Z79899 Other long term (current) drug therapy: Secondary | ICD-10-CM

## 2023-04-30 DIAGNOSIS — Z992 Dependence on renal dialysis: Secondary | ICD-10-CM

## 2023-04-30 DIAGNOSIS — Z8269 Family history of other diseases of the musculoskeletal system and connective tissue: Secondary | ICD-10-CM

## 2023-04-30 DIAGNOSIS — T82898A Other specified complication of vascular prosthetic devices, implants and grafts, initial encounter: Secondary | ICD-10-CM

## 2023-04-30 DIAGNOSIS — I5042 Chronic combined systolic (congestive) and diastolic (congestive) heart failure: Secondary | ICD-10-CM | POA: Diagnosis present

## 2023-04-30 DIAGNOSIS — N185 Chronic kidney disease, stage 5: Secondary | ICD-10-CM

## 2023-04-30 DIAGNOSIS — I132 Hypertensive heart and chronic kidney disease with heart failure and with stage 5 chronic kidney disease, or end stage renal disease: Secondary | ICD-10-CM

## 2023-04-30 DIAGNOSIS — T82590A Other mechanical complication of surgically created arteriovenous fistula, initial encounter: Secondary | ICD-10-CM

## 2023-04-30 DIAGNOSIS — I509 Heart failure, unspecified: Secondary | ICD-10-CM | POA: Diagnosis not present

## 2023-04-30 DIAGNOSIS — N186 End stage renal disease: Secondary | ICD-10-CM

## 2023-04-30 DIAGNOSIS — D631 Anemia in chronic kidney disease: Secondary | ICD-10-CM | POA: Diagnosis present

## 2023-04-30 DIAGNOSIS — I10 Essential (primary) hypertension: Secondary | ICD-10-CM | POA: Diagnosis present

## 2023-04-30 DIAGNOSIS — M109 Gout, unspecified: Secondary | ICD-10-CM | POA: Diagnosis present

## 2023-04-30 DIAGNOSIS — Z885 Allergy status to narcotic agent status: Secondary | ICD-10-CM

## 2023-04-30 DIAGNOSIS — E782 Mixed hyperlipidemia: Secondary | ICD-10-CM | POA: Diagnosis present

## 2023-04-30 DIAGNOSIS — T829XXA Unspecified complication of cardiac and vascular prosthetic device, implant and graft, initial encounter: Principal | ICD-10-CM | POA: Diagnosis present

## 2023-04-30 DIAGNOSIS — Z79891 Long term (current) use of opiate analgesic: Secondary | ICD-10-CM

## 2023-04-30 DIAGNOSIS — L299 Pruritus, unspecified: Secondary | ICD-10-CM | POA: Diagnosis present

## 2023-04-30 DIAGNOSIS — Y832 Surgical operation with anastomosis, bypass or graft as the cause of abnormal reaction of the patient, or of later complication, without mention of misadventure at the time of the procedure: Secondary | ICD-10-CM | POA: Diagnosis present

## 2023-04-30 DIAGNOSIS — I5020 Unspecified systolic (congestive) heart failure: Secondary | ICD-10-CM | POA: Insufficient documentation

## 2023-04-30 DIAGNOSIS — J90 Pleural effusion, not elsewhere classified: Secondary | ICD-10-CM | POA: Diagnosis present

## 2023-04-30 DIAGNOSIS — G8929 Other chronic pain: Secondary | ICD-10-CM

## 2023-04-30 DIAGNOSIS — Z882 Allergy status to sulfonamides status: Secondary | ICD-10-CM

## 2023-04-30 DIAGNOSIS — Z833 Family history of diabetes mellitus: Secondary | ICD-10-CM

## 2023-04-30 DIAGNOSIS — Z8249 Family history of ischemic heart disease and other diseases of the circulatory system: Secondary | ICD-10-CM

## 2023-04-30 DIAGNOSIS — T827XXA Infection and inflammatory reaction due to other cardiac and vascular devices, implants and grafts, initial encounter: Principal | ICD-10-CM | POA: Diagnosis present

## 2023-04-30 DIAGNOSIS — K219 Gastro-esophageal reflux disease without esophagitis: Secondary | ICD-10-CM | POA: Diagnosis present

## 2023-04-30 DIAGNOSIS — J918 Pleural effusion in other conditions classified elsewhere: Secondary | ICD-10-CM | POA: Diagnosis present

## 2023-04-30 HISTORY — PX: REVISON OF ARTERIOVENOUS FISTULA: SHX6074

## 2023-04-30 LAB — POCT I-STAT, CHEM 8
BUN: 55 mg/dL — ABNORMAL HIGH (ref 8–23)
Calcium, Ion: 1.13 mmol/L — ABNORMAL LOW (ref 1.15–1.40)
Chloride: 100 mmol/L (ref 98–111)
Creatinine, Ser: 5.4 mg/dL — ABNORMAL HIGH (ref 0.44–1.00)
Glucose, Bld: 81 mg/dL (ref 70–99)
HCT: 36 % (ref 36.0–46.0)
Hemoglobin: 12.2 g/dL (ref 12.0–15.0)
Potassium: 4 mmol/L (ref 3.5–5.1)
Sodium: 138 mmol/L (ref 135–145)
TCO2: 28 mmol/L (ref 22–32)

## 2023-04-30 LAB — MRSA NEXT GEN BY PCR, NASAL: MRSA by PCR Next Gen: NOT DETECTED

## 2023-04-30 SURGERY — REVISON OF ARTERIOVENOUS FISTULA
Anesthesia: General | Site: Arm Upper | Laterality: Left

## 2023-04-30 MED ORDER — SODIUM CHLORIDE 0.9% FLUSH
3.0000 mL | Freq: Two times a day (BID) | INTRAVENOUS | Status: DC
  Administered 2023-05-01: 3 mL via INTRAVENOUS

## 2023-04-30 MED ORDER — HYDROXYZINE HCL 25 MG PO TABS
25.0000 mg | ORAL_TABLET | Freq: Three times a day (TID) | ORAL | Status: DC | PRN
  Administered 2023-04-30: 25 mg via ORAL
  Filled 2023-04-30: qty 1

## 2023-04-30 MED ORDER — LIDOCAINE-EPINEPHRINE 1 %-1:100000 IJ SOLN
INTRAMUSCULAR | Status: AC
  Filled 2023-04-30: qty 1

## 2023-04-30 MED ORDER — CHLORHEXIDINE GLUCONATE 0.12 % MT SOLN
15.0000 mL | Freq: Once | OROMUCOSAL | Status: AC
  Administered 2023-04-30: 15 mL via OROMUCOSAL
  Filled 2023-04-30: qty 15

## 2023-04-30 MED ORDER — SODIUM CHLORIDE 0.9 % IV SOLN
INTRAVENOUS | Status: DC | PRN
Start: 2023-04-30 — End: 2023-04-30

## 2023-04-30 MED ORDER — DIPHENHYDRAMINE HCL 25 MG PO CAPS: 25.0000 mg | ORAL_CAPSULE | Freq: Four times a day (QID) | ORAL | Status: DC | PRN

## 2023-04-30 MED ORDER — HEPARIN SODIUM (PORCINE) 5000 UNIT/ML IJ SOLN
5000.0000 [IU] | Freq: Three times a day (TID) | INTRAMUSCULAR | Status: DC
  Administered 2023-04-30 – 2023-05-02 (×4): 5000 [IU] via SUBCUTANEOUS
  Filled 2023-04-30 (×5): qty 1

## 2023-04-30 MED ORDER — PROTAMINE SULFATE 10 MG/ML IV SOLN
INTRAVENOUS | Status: DC | PRN
Start: 2023-04-30 — End: 2023-04-30
  Administered 2023-04-30: 30 mg via INTRAVENOUS

## 2023-04-30 MED ORDER — TRAZODONE HCL 50 MG PO TABS
100.0000 mg | ORAL_TABLET | Freq: Every day | ORAL | Status: DC
  Administered 2023-04-30 – 2023-05-01 (×2): 100 mg via ORAL
  Filled 2023-04-30: qty 1
  Filled 2023-04-30: qty 2
  Filled 2023-04-30: qty 1

## 2023-04-30 MED ORDER — OXYCODONE HCL 5 MG PO TABS
5.0000 mg | ORAL_TABLET | Freq: Four times a day (QID) | ORAL | Status: DC | PRN
  Administered 2023-05-01 (×2): 5 mg via ORAL
  Filled 2023-04-30 (×2): qty 1

## 2023-04-30 MED ORDER — OXYCODONE HCL 5 MG/5ML PO SOLN: 5.0000 mg | Freq: Once | ORAL | Status: AC | PRN

## 2023-04-30 MED ORDER — CARVEDILOL 12.5 MG PO TABS: 12.5000 mg | ORAL_TABLET | Freq: Once | ORAL | Status: AC

## 2023-04-30 MED ORDER — PROPOFOL 500 MG/50ML IV EMUL
INTRAVENOUS | Status: DC | PRN
  Administered 2023-04-30: 20 ug/kg/min via INTRAVENOUS

## 2023-04-30 MED ORDER — ALBUTEROL SULFATE (2.5 MG/3ML) 0.083% IN NEBU: 2.5000 mg | INHALATION_SOLUTION | Freq: Four times a day (QID) | RESPIRATORY_TRACT | Status: DC | PRN

## 2023-04-30 MED ORDER — LIDOCAINE-EPINEPHRINE 1 %-1:100000 IJ SOLN
INTRAMUSCULAR | Status: DC | PRN
Start: 2023-04-30 — End: 2023-04-30
  Administered 2023-04-30: 8 mL

## 2023-04-30 MED ORDER — ACETAMINOPHEN 500 MG PO TABS
1000.0000 mg | ORAL_TABLET | Freq: Once | ORAL | Status: AC
  Administered 2023-04-30: 1000 mg via ORAL
  Filled 2023-04-30: qty 2

## 2023-04-30 MED ORDER — LIDOCAINE 2% (20 MG/ML) 5 ML SYRINGE
INTRAMUSCULAR | Status: DC | PRN
  Administered 2023-04-30: 40 mg via INTRAVENOUS

## 2023-04-30 MED ORDER — ONDANSETRON HCL 4 MG/2ML IJ SOLN: 4.0000 mg | Freq: Once | INTRAMUSCULAR | Status: DC | PRN

## 2023-04-30 MED ORDER — VANCOMYCIN HCL 500 MG/100ML IV SOLN
500.0000 mg | INTRAVENOUS | Status: DC
  Filled 2023-04-30: qty 100

## 2023-04-30 MED ORDER — HYDRALAZINE HCL 20 MG/ML IJ SOLN: 10.0000 mg | INTRAMUSCULAR | Status: DC | PRN

## 2023-04-30 MED ORDER — HEPARIN 6000 UNIT IRRIGATION SOLUTION
Status: AC
  Filled 2023-04-30: qty 500

## 2023-04-30 MED ORDER — HYDRALAZINE HCL 50 MG PO TABS
50.0000 mg | ORAL_TABLET | ORAL | Status: DC
  Administered 2023-04-30 – 2023-05-02 (×3): 50 mg via ORAL
  Filled 2023-04-30 (×3): qty 1

## 2023-04-30 MED ORDER — HYDRALAZINE HCL 50 MG PO TABS: 50.0000 mg | ORAL_TABLET | Freq: Two times a day (BID) | ORAL | Status: DC

## 2023-04-30 MED ORDER — PHENYLEPHRINE HCL-NACL 20-0.9 MG/250ML-% IV SOLN
INTRAVENOUS | Status: DC | PRN
  Administered 2023-04-30: 30 ug/min via INTRAVENOUS

## 2023-04-30 MED ORDER — CARVEDILOL 12.5 MG PO TABS
ORAL_TABLET | ORAL | Status: AC
  Administered 2023-04-30: 12.5 mg via ORAL
  Filled 2023-04-30: qty 1

## 2023-04-30 MED ORDER — OXYCODONE HCL 5 MG PO TABS
5.0000 mg | ORAL_TABLET | Freq: Once | ORAL | Status: AC
  Administered 2023-04-30: 5 mg via ORAL
  Filled 2023-04-30: qty 1

## 2023-04-30 MED ORDER — ACETAMINOPHEN 650 MG RE SUPP: 650.0000 mg | Freq: Four times a day (QID) | RECTAL | Status: DC | PRN

## 2023-04-30 MED ORDER — VANCOMYCIN HCL IN DEXTROSE 1-5 GM/200ML-% IV SOLN
1000.0000 mg | INTRAVENOUS | Status: AC
  Administered 2023-04-30: 1000 mg via INTRAVENOUS
  Filled 2023-04-30: qty 200

## 2023-04-30 MED ORDER — PHENYLEPHRINE 80 MCG/ML (10ML) SYRINGE FOR IV PUSH (FOR BLOOD PRESSURE SUPPORT)
PREFILLED_SYRINGE | INTRAVENOUS | Status: DC | PRN
  Administered 2023-04-30: 80 ug via INTRAVENOUS

## 2023-04-30 MED ORDER — CHLORHEXIDINE GLUCONATE 4 % EX SOLN: 60.0000 mL | Freq: Once | CUTANEOUS | Status: DC

## 2023-04-30 MED ORDER — OXYCODONE HCL 5 MG PO TABS
ORAL_TABLET | ORAL | Status: AC
  Filled 2023-04-30: qty 1

## 2023-04-30 MED ORDER — HEPARIN 6000 UNIT IRRIGATION SOLUTION
Status: DC | PRN
  Administered 2023-04-30: 1

## 2023-04-30 MED ORDER — FENTANYL CITRATE (PF) 100 MCG/2ML IJ SOLN: 25.0000 ug | INTRAMUSCULAR | Status: DC | PRN

## 2023-04-30 MED ORDER — OXYCODONE HCL 5 MG PO TABS
5.0000 mg | ORAL_TABLET | Freq: Once | ORAL | Status: AC | PRN
  Administered 2023-04-30: 5 mg via ORAL

## 2023-04-30 MED ORDER — ACETAMINOPHEN 325 MG PO TABS
650.0000 mg | ORAL_TABLET | Freq: Four times a day (QID) | ORAL | Status: DC | PRN
  Administered 2023-04-30 – 2023-05-01 (×2): 650 mg via ORAL
  Filled 2023-04-30 (×3): qty 2

## 2023-04-30 MED ORDER — OXYCODONE HCL 5 MG PO TABS
5.0000 mg | ORAL_TABLET | Freq: Three times a day (TID) | ORAL | Status: DC | PRN
  Administered 2023-04-30: 5 mg via ORAL

## 2023-04-30 MED ORDER — ORAL CARE MOUTH RINSE: 15.0000 mL | Freq: Once | OROMUCOSAL | Status: AC

## 2023-04-30 MED ORDER — 0.9 % SODIUM CHLORIDE (POUR BTL) OPTIME
TOPICAL | Status: DC | PRN
  Administered 2023-04-30: 1000 mL

## 2023-04-30 MED ORDER — FENTANYL CITRATE (PF) 100 MCG/2ML IJ SOLN
INTRAMUSCULAR | Status: DC | PRN
Start: 2023-04-30 — End: 2023-04-30
  Administered 2023-04-30 (×2): 25 ug via INTRAVENOUS
  Administered 2023-04-30: 50 ug via INTRAVENOUS

## 2023-04-30 MED ORDER — PROPOFOL 10 MG/ML IV BOLUS
INTRAVENOUS | Status: AC
  Filled 2023-04-30: qty 20

## 2023-04-30 MED ORDER — HEPARIN SODIUM (PORCINE) 1000 UNIT/ML IJ SOLN
INTRAMUSCULAR | Status: DC | PRN
Start: 2023-04-30 — End: 2023-04-30
  Administered 2023-04-30: 5000 [IU] via INTRAVENOUS

## 2023-04-30 MED ORDER — SODIUM CHLORIDE 0.9 % IV SOLN: INTRAVENOUS | Status: DC | PRN

## 2023-04-30 MED ORDER — DEXMEDETOMIDINE HCL IN NACL 80 MCG/20ML IV SOLN
INTRAVENOUS | Status: DC | PRN
Start: 2023-04-30 — End: 2023-04-30
  Administered 2023-04-30: 12 ug via INTRAVENOUS

## 2023-04-30 MED ORDER — SODIUM CHLORIDE 0.9 % IV SOLN: INTRAVENOUS | Status: DC

## 2023-04-30 MED ORDER — CARVEDILOL 12.5 MG PO TABS
12.5000 mg | ORAL_TABLET | Freq: Two times a day (BID) | ORAL | Status: DC
  Administered 2023-04-30 – 2023-05-02 (×2): 12.5 mg via ORAL
  Filled 2023-04-30 (×2): qty 1

## 2023-04-30 MED ORDER — FENTANYL CITRATE (PF) 100 MCG/2ML IJ SOLN
INTRAMUSCULAR | Status: AC
  Filled 2023-04-30: qty 2

## 2023-04-30 SURGICAL SUPPLY — 47 items
ADH SKN CLS APL DERMABOND .7 (GAUZE/BANDAGES/DRESSINGS) ×1
ARMBAND PINK RESTRICT EXTREMIT (MISCELLANEOUS) ×1 IMPLANT
BAG COUNTER SPONGE SURGICOUNT (BAG) ×1 IMPLANT
BAG SPNG CNTER NS LX DISP (BAG) ×1
BNDG CMPR 5X4 KNIT ELC UNQ LF (GAUZE/BANDAGES/DRESSINGS) ×1
BNDG ELASTIC 4INX 5YD STR LF (GAUZE/BANDAGES/DRESSINGS) IMPLANT
BNDG ELASTIC 4X5.8 VLCR STR LF (GAUZE/BANDAGES/DRESSINGS) ×1 IMPLANT
CANISTER SUCT 3000ML PPV (MISCELLANEOUS) ×1 IMPLANT
CATH EMB 3FR 40 (CATHETERS) IMPLANT
CLIP TI MEDIUM 24 (CLIP) ×1 IMPLANT
CLIP TI MEDIUM 6 (CLIP) ×2 IMPLANT
CLIP TI WIDE RED SMALL 24 (CLIP) ×1 IMPLANT
CLIP TI WIDE RED SMALL 6 (CLIP) ×1 IMPLANT
COVER PROBE W GEL 5X96 (DRAPES) IMPLANT
DERMABOND ADVANCED .7 DNX12 (GAUZE/BANDAGES/DRESSINGS) ×1 IMPLANT
DRSG XEROFORM 1X8 (GAUZE/BANDAGES/DRESSINGS) IMPLANT
ELECT REM PT RETURN 9FT ADLT (ELECTROSURGICAL) ×1
ELECTRODE REM PT RTRN 9FT ADLT (ELECTROSURGICAL) ×1 IMPLANT
GAUZE SPONGE 4X4 12PLY STRL (GAUZE/BANDAGES/DRESSINGS) IMPLANT
GLOVE BIOGEL PI IND STRL 8 (GLOVE) ×1 IMPLANT
GOWN STRL REUS W/ TWL LRG LVL3 (GOWN DISPOSABLE) ×2 IMPLANT
GOWN STRL REUS W/TWL 2XL LVL3 (GOWN DISPOSABLE) ×2 IMPLANT
GOWN STRL REUS W/TWL LRG LVL3 (GOWN DISPOSABLE) ×2
GRAFT COLLAGEN VASCULAR 6X19 (Vascular Products) IMPLANT
GRAFT SKIN WND MICRO 38 (Tissue) IMPLANT
KIT BASIN OR (CUSTOM PROCEDURE TRAY) ×1 IMPLANT
KIT TURNOVER KIT B (KITS) ×1 IMPLANT
NS IRRIG 1000ML POUR BTL (IV SOLUTION) ×1 IMPLANT
PACK CV ACCESS (CUSTOM PROCEDURE TRAY) ×1 IMPLANT
PAD ARMBOARD 7.5X6 YLW CONV (MISCELLANEOUS) ×2 IMPLANT
SLING ARM FOAM STRAP LRG (SOFTGOODS) IMPLANT
SLING ARM FOAM STRAP MED (SOFTGOODS) IMPLANT
SPIKE FLUID TRANSFER (MISCELLANEOUS) ×1 IMPLANT
SPONGE T-LAP 18X18 ~~LOC~~+RFID (SPONGE) IMPLANT
STAPLER VISISTAT 35W (STAPLE) IMPLANT
SUT MNCRL AB 4-0 PS2 18 (SUTURE) ×1 IMPLANT
SUT PROLENE 5 0 C 1 24 (SUTURE) IMPLANT
SUT PROLENE 6 0 BV (SUTURE) IMPLANT
SUT PROLENE 7 0 BV 1 (SUTURE) IMPLANT
SUT SILK 3 0 SH CR/8 (SUTURE) ×1 IMPLANT
SUT VIC AB 3-0 SH 27 (SUTURE) ×1
SUT VIC AB 3-0 SH 27X BRD (SUTURE) ×1 IMPLANT
SWAB CULTURE ESWAB REG 1ML (MISCELLANEOUS) IMPLANT
SWAB CULTURE LIQ STUART DBL (MISCELLANEOUS) IMPLANT
TOWEL GREEN STERILE (TOWEL DISPOSABLE) ×1 IMPLANT
UNDERPAD 30X36 HEAVY ABSORB (UNDERPADS AND DIAPERS) ×1 IMPLANT
WATER STERILE IRR 1000ML POUR (IV SOLUTION) ×1 IMPLANT

## 2023-04-30 NOTE — H&P (Signed)
Office Note   Patient seen and examined in preop holding.  No complaints. No changes to medication history or physical exam since last seen in clinic. After discussing the risks and benefits of fistula revision for infected eschar, Felicia Williams elected to proceed.  She has pneumonia. Se is aware this is suboptimal and adds risk. Home O2 dependent. Plan for outpt tap however may need inpt admission. I'm afraid if we wait on surgery the lesion will worsen and threaten the graft. If the graft is involved today, Felicia Williams knows it will not be resected today due to her current condition. I would optimize her inpt prior to interposition revision. She was okay with this plan.   Victorino Sparrow MD   CC: Drainage at left arm AV graft access site Requesting Provider:  Victorino Sparrow, MD  HPI: Felicia Williams is a 82 y.o. (08-Jun-1941) female presenting at the request of .Rebekah Chesterfield, NP to evaluate drainage at left arm AV graft access site.  On exam, Felicia Williams was doing well, accompanied by her son.  She has previous history of left arm AV graft placement by Dr. Arbie Cookey in 2023.  This access site has been working well.  Recently the graft was infiltrated leading to hematoma formation.  The hematoma has resolved, however there continues to be a small amount of drainage from one of the percutaneous access sites.  Felicia Williams denies fevers, chills.  Denies erythema surrounding the site. Denies symptoms associated with steal syndrome.  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 Karilyn Cota.   GERD (gastroesophageal reflux disease)    Glomerulonephritis    Gout     Mixed hyperlipidemia    Spondylosis     Past Surgical History:  Procedure Laterality Date   APPENDECTOMY     AV FISTULA PLACEMENT Left 03/20/2022   Procedure: INSERTION OF LEFT UPPER ARM ARTERIOVENOUS (AV) GORE-TEX GRAFT;  Surgeon: Larina Earthly, MD;  Location: [facility];  Service: Vascular;  Laterality: Left;   CATARACT EXTRACTION Bilateral    COLONOSCOPY N/A 12/28/2015   Procedure: COLONOSCOPY;  Surgeon: Malissa Hippo, MD;  Location: [facility];  Service: Endoscopy;  Laterality: N/A;  815   ESOPHAGOGASTRODUODENOSCOPY N/A 11/16/2020   Procedure: ESOPHAGOGASTRODUODENOSCOPY (EGD);  Surgeon: Malissa Hippo, MD;  Location: [facility];  Service: Endoscopy;  Laterality: N/A;  1:15   EXCHANGE OF A DIALYSIS CATHETER N/A 03/20/2022   Procedure: EXCHANGE OF A DIALYSIS CATHETER;  Surgeon: Larina Earthly, MD;  Location: [facility];  Service: Vascular;  Laterality: N/A;   INSERTION OF DIALYSIS CATHETER Right 09/30/2021   Procedure: INSERTION OF TUNNELED DIALYSIS CATHETER;  Surgeon: Maeola Harman, MD;  Location: [facility] OR;  Service: Vascular;  Laterality: Right;   IR FLUORO GUIDE CV LINE RIGHT  01/16/2019   IR REMOVAL TUN CV CATH W/O FL  07/01/2019   IR THROMBECTOMY AV FISTULA W/THROMBOLYSIS/PTA INC/SHUNT/IMG LEFT Left 02/01/2023   IR US GUIDE VASC ACCESS LEFT  02/01/2023   IR US GUIDE VASC ACCESS RIGHT  01/16/2019   POLYPECTOMY  11/16/2020  Procedure: POLYPECTOMY;  Surgeon: Malissa Hippo, MD;  Location: [facility];  Service: Endoscopy;;  gastric   REMOVAL OF A DIALYSIS CATHETER  09/30/2021   Procedure: REMOVAL OF A PERITONEAL DIALYSIS CATHETER;  Surgeon: Maeola Harman, MD;  Location: [facility] OR;  Service: Vascular;;   REMOVAL OF A DIALYSIS CATHETER N/A 05/22/2022   Procedure: MINOR REMOVAL OF A TUNNELED DIALYSIS CATHETER;  Surgeon: Larina Earthly, MD;  Location: [facility];  Service: Vascular;  Laterality: N/A;   TUBAL LIGATION      Social History   Socioeconomic History    Marital status: Single    Spouse name: Not on file   Number of children: Not on file   Years of education: Not on file   Highest education level: Not on file  Occupational History   Not on file  Tobacco Use   Smoking status: Never    Passive exposure: Never   Smokeless tobacco: Never  Vaping Use   Vaping status: Never Used  Substance and Sexual Activity   Alcohol use: No   Drug use: No   Sexual activity: Not Currently  Other Topics Concern   Not on file  Social History Narrative   Not on file   Social Determinants of Health   Financial Resource Strain: Not on file  Food Insecurity: No Food Insecurity (02/11/2023)   Hunger Vital Sign    Worried About Running Out of Food in the Last Year: Never true    Ran Out of Food in the Last Year: Never true  Transportation Needs: No Transportation Needs (02/27/2023)   Received from [facility]   PRAPARE - Transportation    Lack of Transportation (Medical): No    Lack of Transportation (Non-Medical): No  Physical Activity: Not on file  Stress: Not on file  Social Connections: Not on file  Intimate Partner Violence: Not At Risk (02/11/2023)   Humiliation, Afraid, Rape, and Kick questionnaire    Fear of Current or Ex-Partner: No    Emotionally Abused: No    Physically Abused: No    Sexually Abused: No   Family History  Problem Relation Age of Onset   CAD Father    Heart attack Father    Diabetes Mellitus II Father    Hypertension Father    Lupus Brother     Current Facility-Administered Medications  Medication Dose Route Frequency Provider Last Rate Last Admin   0.9 %  sodium chloride infusion   Intravenous Continuous Victorino Sparrow, MD       chlorhexidine (HIBICLENS) 4 % liquid 4 Application  60 mL Topical Once Victorino Sparrow, MD       And   [START ON 05/01/2023] chlorhexidine (HIBICLENS) 4 % liquid 4 Application  60 mL Topical Once Victorino Sparrow, MD       vancomycin (VANCOCIN) IVPB 1000 mg/200 mL premix  1,000 mg  Intravenous 60 min Pre-Op Victorino Sparrow, MD        Allergies  Allergen Reactions   Cefepime Other (See Comments)    Feb 2023 Encephalopathy with questionable seizures. Seems to be tolerating cefazolin , but the daughter mentioned not to start her on any cephalosporin     Morphine Other (See Comments)    "Dry heaving like crazy"   Sulfa Antibiotics Other (See Comments)    Shut pt's kidneys down     REVIEW OF SYSTEMS:  [X]  denotes positive finding, [ ]  denotes negative finding Cardiac  Comments:  Chest  pain or chest pressure:    Shortness of breath upon exertion:    Short of breath when lying flat:    Irregular heart rhythm:        Vascular    Pain in calf, thigh, or hip brought on by ambulation:    Pain in feet at night that wakes you up from your sleep:     Blood clot in your veins:    Leg swelling:         Pulmonary    Oxygen at home:    Productive cough:     Wheezing:         Neurologic    Sudden weakness in arms or legs:     Sudden numbness in arms or legs:     Sudden onset of difficulty speaking or slurred speech:    Temporary loss of vision in one eye:     Problems with dizziness:         Gastrointestinal    Blood in stool:     Vomited blood:         Genitourinary    Burning when urinating:     Blood in urine:        Psychiatric    Major depression:         Hematologic    Bleeding problems:    Problems with blood clotting too easily:        Skin    Rashes or ulcers:        Constitutional    Fever or chills:      PHYSICAL EXAMINATION:  Vitals:   04/29/23 1726 04/29/23 1738 04/30/23 0553  BP:   (!) 189/56  Pulse:   71  Resp:   18  Temp:   98.5 F (36.9 C)  TempSrc:   Oral  SpO2:   97%  Weight: 43.1 kg 43.1 kg 43.1 kg  Height: 4\' 11"  (1.499 m) 4\' 11"  (1.499 m) 4\' 11"  (1.499 m)    General:  WDWN in NAD; vital signs documented above Gait: Not observed HENT: WNL, normocephalic Pulmonary: normal non-labored breathing , without  wheezing Cardiac: regular HR Abdomen: soft, NT, no masses Skin: without rashes Vascular Exam/Pulses:  Right Left  Radial 2+ (normal) 2+ (normal)                       Extremities: without ischemic changes, without Gangrene , without cellulitis;  Left arm AV graft with mild pulsatility, thrill present.  Small percutaneous access site with a small amount of drainage with compression.  No purulence. Musculoskeletal: no muscle wasting or atrophy  Neurologic: A&O X 3;  No focal weakness or paresthesias are detected Psychiatric:  The pt has Normal affect.      ASSESSMENT/PLAN: Felicia Williams is a 82 y.o. female presenting with concern for infected percutaneous access site of left arm AV graft. On inspection, one of the percutaneous access sites had fluid drainage when compressed.  I am concerned that without resection, this could involve the graft, and may well already involve the graft.  I am hopeful that this is a superficial lesion that can be resected.  I think if the graft were infected, she would have systemic symptoms -she has none.  Felicia Williams and I discussed resection of the eschar which has underlying drainage.  Should this involve the graft, we would discuss next steps following the procedure.  My plan is to make the resection small so the fistula can still be  accessed.  After discussing the risks and benefits, Jerrica elected to proceed.     Victorino Sparrow, MD [facility] [phone] Total time of patient care including pre-visit research, consultation, and documentation greater than 30 minutes

## 2023-04-30 NOTE — Care Management Obs Status (Signed)
MEDICARE OBSERVATION STATUS NOTIFICATION   Patient Details  Name: Felicia Williams MRN: [id number] Date of Birth: 09/23/1940   Medicare Observation Status Notification Given:  Waylan Boga, RN 04/30/2023, 6:36 PM

## 2023-04-30 NOTE — Transfer of Care (Signed)
Immediate Anesthesia Transfer of Care Note  Patient: Felicia Williams  Procedure(s) Performed: LEFT ARM FISTULA REVISION, INTERPOSITIONAL ARTEGRAFT (Left: Arm Upper)  Patient Location: PACU  Anesthesia Type:MAC  Level of Consciousness: drowsy and patient cooperative  Airway & Oxygen Therapy: Patient Spontanous Breathing and Patient connected to nasal cannula oxygen  Post-op Assessment: Report given to RN, Post -op Vital signs reviewed and stable, and Patient moving all extremities X 4  Post vital signs: Reviewed and stable  Last Vitals:  Vitals Value Taken Time  BP 125/48 04/30/23 0910  Temp    Pulse 61 04/30/23 0911  Resp 18 04/30/23 0911  SpO2 93 % 04/30/23 0911  Vitals shown include unfiled device data.  Last Pain:  Vitals:   04/30/23 0639  TempSrc:   PainSc: 10-Worst pain ever         Complications: No notable events documented.

## 2023-04-30 NOTE — Anesthesia Postprocedure Evaluation (Signed)
Anesthesia Post Note  Patient: Capucine Tryon  Procedure(s) Performed: LEFT ARM FISTULA REVISION, INTERPOSITIONAL ARTEGRAFT (Left: Arm Upper)     Patient location during evaluation: PACU Anesthesia Type: MAC Level of consciousness: awake and alert Pain management: pain level controlled Vital Signs Assessment: post-procedure vital signs reviewed and stable Respiratory status: spontaneous breathing, nonlabored ventilation and respiratory function stable Cardiovascular status: blood pressure returned to baseline and stable Postop Assessment: no apparent nausea or vomiting Anesthetic complications: no   No notable events documented.  Last Vitals:  Vitals:   04/30/23 0930 04/30/23 0945  BP: (!) 131/51 (!) 138/47  Pulse: 63 61  Resp: (!) 21 (!) 22  Temp:  36.5 C  SpO2: 93% 96%    Last Pain:  Vitals:   04/30/23 0930  TempSrc:   PainSc: 0-No pain                 Lannie Fields

## 2023-04-30 NOTE — Progress Notes (Signed)
Pharmacy Antibiotic Note  Felicia Williams is a 82 y.o. female admitted on 04/30/2023 with  wound infection .  Pharmacy has been consulted for vanco dosing.  Plan: Vanco 1 gram iv load given 04/30/2023 at 0600 in the OR. Vanco 500 mg iv QMWF w/ dialysis  Height: 4\' 11"  (149.9 cm) Weight: 43.1 kg (95 lb 0.3 oz) IBW/kg (Calculated) : 43.2  Temp (24hrs), Avg:97.8 F (36.6 C), Min:97.3 F (36.3 C), Max:98.5 F (36.9 C)  Recent Labs  Lab 04/30/23 0652  CREATININE 5.40*    Estimated Creatinine Clearance: 5.4 mL/min (A) (by C-G formula based on SCr of 5.4 mg/dL (H)).    Allergies  Allergen Reactions   Cefepime Other (See Comments)    Feb 2023 Encephalopathy with questionable seizures. Seems to be tolerating cefazolin , but the daughter mentioned not to start her on any cephalosporin     Morphine Other (See Comments)    "Dry heaving like crazy"   Sulfa Antibiotics Other (See Comments)    Shut pt's kidneys down      Thank you for allowing pharmacy to be a part of this patient's care.  Greta Doom BS, PharmD, BCPS Clinical Pharmacist 04/30/2023 3:00 PM  Contact: [phone] after 3 PM  "Be curious, not judgmental..." -Debbora Dus

## 2023-04-30 NOTE — Anesthesia Preprocedure Evaluation (Addendum)
Anesthesia Evaluation  Patient identified by MRN, date of birth, ID band Patient awake    Reviewed: Allergy & Precautions, H&P , NPO status , Patient's Chart, lab work & pertinent test results, reviewed documented beta blocker date and time   Airway Mallampati: III  TM Distance: >3 FB Neck ROM: Full    Dental  (+) Teeth Intact, Dental Advisory Given   Pulmonary shortness of breath (2LPM Loiza at home), with exertion and Long-Term Oxygen Therapy Has been on oxygen 24/7 since July when she had her first pneumnia. Saw PCP yesterday who did a CXR which showed significant fluid on L lung +/- recurrent pneumonia- they have plans for potential pleuracentesis in the future to tap L lung.   Pulmonary exam normal breath sounds clear to auscultation       Cardiovascular hypertension (189/56 preop), Pt. on medications and Pt. on home beta blockers +CHF  Normal cardiovascular exam Rhythm:Regular Rate:Normal  Echo 02/11/23:  1. Left ventricular ejection fraction, by estimation, is 35 to 40%. The  left ventricle has moderately decreased function. The left ventricle  demonstrates global hypokinesis. Left ventricular diastolic parameters are  consistent with Grade I diastolic  dysfunction (impaired relaxation). Elevated left atrial pressure.   2. Right ventricular systolic function is mildly reduced. The right  ventricular size is normal.   3. Left atrial size was mildly dilated.   4. Mild mitral valve regurgitation.   5. The aortic valve is tricuspid. Aortic valve regurgitation is mild.  Aortic valve sclerosis/calcification is present, without any evidence of  aortic stenosis.   6. The inferior vena cava is normal in size with greater than 50%  respiratory variability, suggesting right atrial pressure of 3 mmHg.      Neuro/Psych  PSYCHIATRIC DISORDERS Anxiety Depression    negative neurological ROS     GI/Hepatic Neg liver ROS,GERD   Controlled,,Hx gastroparesis   Endo/Other  negative endocrine ROS    Renal/GU ESRF and DialysisRenal disease  negative genitourinary   Musculoskeletal  (+) Arthritis , Osteoarthritis,    Abdominal   Peds negative pediatric ROS (+)  Hematology  (+) Blood dyscrasia, anemia   Anesthesia Other Findings   Reproductive/Obstetrics negative OB ROS                             Anesthesia Physical Anesthesia Plan  ASA: 4  Anesthesia Plan: MAC   Post-op Pain Management: Tylenol PO (pre-op)*   Induction: Intravenous  PONV Risk Score and Plan: 3 and Ondansetron, Treatment may vary due to age or medical condition, Propofol infusion and TIVA  Airway Management Planned: Natural Airway and Simple Face Mask  Additional Equipment: None  Intra-op Plan:   Post-operative Plan:   Informed Consent: I have reviewed the patients History and Physical, chart, labs and discussed the procedure including the risks, benefits and alternatives for the proposed anesthesia with the patient or authorized representative who has indicated his/her understanding and acceptance.       Plan Discussed with: CRNA  Anesthesia Plan Comments: (Unstable from a pulmonary and cardiac )        Anesthesia Quick Evaluation

## 2023-04-30 NOTE — Progress Notes (Addendum)
[facility] night cross cover note:   I was notified by RN that the patient is requesting a medication for pruritus.  I subsequently placed order for prn p.o. Benadryl for pruritus.  Update: The patient reports a history of paradoxical agitation/anxiety to Benadryl.  I subsequently discontinued Benadryl and placed updated order for as needed hydroxyzine for pruritus.    Newton Pigg, DO Hospitalist

## 2023-04-30 NOTE — Plan of Care (Signed)

## 2023-04-30 NOTE — Care Management CC44 (Signed)
Condition Code 44 Documentation Completed  Patient Details  Name: Felicia Williams MRN: [id number] Date of Birth: [birth year]   Condition Code 44 given:  Yes Patient signature on Condition Code 44 notice:  Yes Documentation of 2 MD's agreement:  Yes Code 44 added to claim:  Yes    Michel Bickers, RN 04/30/2023, 6:36 PM

## 2023-04-30 NOTE — Consult Note (Signed)
Initial Consultation Note   Patient: Felicia Williams EXB:[id number] DOB: January 31, 1941 PCP: Rebekah Chesterfield, NP DOA: 04/30/2023 DOS: the patient was seen and examined on 04/30/2023 Primary service: Clydie Braun, MD  Referring physician: Dr. Karin Lieu Reason for consult: Admission  Assessment/Plan: Assessment and Plan:  Complication of AV fistula Possible infected AV fistula site Patient presented due to ulceration of the AV fistula after it was infiltrated leading to a hematoma.  Patient underwent left arm fistula revision with interpositional artegraft with Dr. Sherral Hammers.  Blood cultures have been sent and patient had been given vancomycin perioperatively as there was concern for possible infection with drainage noted from the ulceration. -Admit to a medical telemetry bed -Follow-up blood cultures -Vancomycin per pharmacy -Oxycodone changed to every 6 hours as needed in the acute setting of recent surgical procedure  Pleural effusion due to congestive heart failure Acute.  Patient noted to have large left pleural effusion and small right pleural effusion. Last echocardiogram noted EF to be 35 to 40% with grade 1 diastolic dysfunction back on 02/11/2023. -Ultrasound-guided thoracentesis in a.m. with fluid analysis -Management otherwise with hemodialysis  Chronic respiratory failure with hypoxia Patient on 2 L of nasal cannula oxygen at baseline at home.  O2 saturations maintained on 2 L of nasal cannula oxygen. -Continue nasal cannula oxygen maintain O2 saturations above 90%  ESRD on HD Patient normally dialyzes Monday, Wednesday, Friday. -Dr. Arlean Hopping of nephrology consulted for need of dialysis in a.m.  Essential hypertension Initial blood pressures were noted to be elevated up to 189/56. -Continue Coreg and hydralazine -Hydralazine IV as needed.  Chronic pain Patient is on chronic opioids at baseline. -Continue oxycodone   TRH will continue to follow the patient.  HPI:  Felicia Williams is a 82 y.o. female with past medical history of hypertension, hyperlipidemia, CHF, chronic respiratory failure on 2 L of oxygen ESRD on HD(MWF) with previous AV fistula graft placed in the left arm by Dr. Arbie Cookey back in 2023 who presented due to draining ulcer of her AV fistula.  Recently the graft was infiltrated leading to a hematoma formation.  Hematoma resolved, but there continues to be a small amount of drainage from one of the access sites.  Patient had not had any fevers chills, erythema around her fistula site, nausea, vomiting, or leg swelling.  She does report having a chronic nonproductive cough that has been present for over a year.  She had recently had a chest x-ray which had noted excess fluid in her lungs.  At baseline she does not make urine.  Perioperative labs noted hemoglobin 12.2, sodium 138, potassium 4, BUN 55, creatinine 5.4. Chest x-ray had noted a large left pleural effusion and small right pleural effusion.  Patient underwent left arm fistula revision with interpositional artegraft with Dr. Sherral Hammers.  Patient had been given vancomycin perioperatively.  Admission was requested due to need of thoracentesis and to assess if hemodialysis is possible with the revision.  There was concern for possibility of a infected eschar.  Blood cultures had been sent.   Review of Systems: As mentioned in the history of present illness. All other systems reviewed and are negative. Past Medical History:  Diagnosis Date   Anemia in ESRD (end-stage renal disease) (HCC) 08/18/2018   CHF (congestive heart failure) (HCC)    Coccyx contusion--with chronic pain due to fall    Depression    Dyspnea    02 2l via [place]   ESRD on hemodialysis (HCC)    Essential hypertension,  benign    Gastric polyps    Gastroparesis    followed by Dr. Karilyn Cota.   GERD (gastroesophageal reflux disease)    Glomerulonephritis    Gout    Mixed hyperlipidemia    Spondylosis    Past Surgical History:   Procedure Laterality Date   APPENDECTOMY     AV FISTULA PLACEMENT Left 03/20/2022   Procedure: INSERTION OF LEFT UPPER ARM ARTERIOVENOUS (AV) GORE-TEX GRAFT;  Surgeon: Larina Earthly, MD;  Location: [facility];  Service: Vascular;  Laterality: Left;   CATARACT EXTRACTION Bilateral    COLONOSCOPY N/A 12/28/2015   Procedure: COLONOSCOPY;  Surgeon: Malissa Hippo, MD;  Location: [facility];  Service: Endoscopy;  Laterality: N/A;  815   ESOPHAGOGASTRODUODENOSCOPY N/A 11/16/2020   Procedure: ESOPHAGOGASTRODUODENOSCOPY (EGD);  Surgeon: Malissa Hippo, MD;  Location: [facility];  Service: Endoscopy;  Laterality: N/A;  1:15   EXCHANGE OF A DIALYSIS CATHETER N/A 03/20/2022   Procedure: EXCHANGE OF A DIALYSIS CATHETER;  Surgeon: Larina Earthly, MD;  Location: [facility];  Service: Vascular;  Laterality: N/A;   INSERTION OF DIALYSIS CATHETER Right 09/30/2021   Procedure: INSERTION OF TUNNELED DIALYSIS CATHETER;  Surgeon: Maeola Harman, MD;  Location: [facility] OR;  Service: Vascular;  Laterality: Right;   IR FLUORO GUIDE CV LINE RIGHT  01/16/2019   IR REMOVAL TUN CV CATH W/O FL  07/01/2019   IR THROMBECTOMY AV FISTULA W/THROMBOLYSIS/PTA INC/SHUNT/IMG LEFT Left 02/01/2023   IR US GUIDE VASC ACCESS LEFT  02/01/2023   IR US GUIDE VASC ACCESS RIGHT  01/16/2019   POLYPECTOMY  11/16/2020   Procedure: POLYPECTOMY;  Surgeon: Malissa Hippo, MD;  [facility];  Service: Endoscopy;;  gastric   REMOVAL OF A DIALYSIS CATHETER  09/30/2021   Procedure: REMOVAL OF A PERITONEAL DIALYSIS CATHETER;  Surgeon: Maeola Harman, MD;  Location: [facility] OR;  Service: Vascular;;   REMOVAL OF A DIALYSIS CATHETER N/A 05/22/2022   Procedure: MINOR REMOVAL OF A TUNNELED DIALYSIS CATHETER;  Surgeon: Larina Earthly, MD;  Location: [facility];  Service: Vascular;  Laterality: N/A;   TUBAL LIGATION     Social History:  reports that she has never smoked. She has never been exposed to tobacco smoke. She has never  used smokeless tobacco. She reports that she does not drink alcohol and does not use drugs.  Allergies  Allergen Reactions   Cefepime Other (See Comments)    Feb 2023 Encephalopathy with questionable seizures. Seems to be tolerating cefazolin , but the daughter mentioned not to start her on any cephalosporin     Morphine Other (See Comments)    "Dry heaving like crazy"   Sulfa Antibiotics Other (See Comments)    Shut pt's kidneys down    Family History  Problem Relation Age of Onset   CAD Father    Heart attack Father    Diabetes Mellitus II Father    Hypertension Father    Lupus Brother     Prior to Admission medications   Medication Sig Start Date End Date Taking? Authorizing Provider  carvedilol (COREG) 12.5 MG tablet Take 1 tablet by mouth 2 (two) times daily.   Yes [provider]  hydrALAZINE (APRESOLINE) 50 MG tablet Take 50 mg by mouth 2 (two) times daily. Do not take morning dose on Dialysis days. Monday,Wednesday and Friday   Yes [provider]  nitroGLYCERIN (NITROSTAT) 0.4 MG SL tablet Place 0.4 mg under the tongue every 5 (five) minutes  as needed for chest pain. 12/28/22  Yes [provider]  oxyCODONE (OXY IR/ROXICODONE) 5 MG immediate release tablet Take 5 mg by mouth every 8 (eight) hours as needed. 04/09/23  Yes [provider]  traZODone (DESYREL) 100 MG tablet Take 100 mg by mouth at bedtime.   Yes [provider]    Physical Exam: Vitals:   04/30/23 1040 04/30/23 1400 04/30/23 1420 04/30/23 1626  BP: (!) 136/53 (!) 157/52 137/80 (!) 164/48  Pulse: 76 63 64   Resp: 16  18   Temp:  (!) 97.3 F (36.3 C) 97.7 F (36.5 C) 97.6 F (36.4 C)  TempSrc:   Oral Oral  SpO2: 100% 98% 100% 97%  Weight:      Height:       Constitutional: Elderly female currently in NAD, calm, comfortable Eyes: PERRL, lids and conjunctivae normal ENMT: Mucous membranes are moist. Normal dentition.  Neck: normal, supple, no masses   Respiratory: Normal respiratory effort with decreased aeration noted on the left lung field.  Patient able to talk in complete sentences currently on 2 L nasal cannula oxygen. Cardiovascular: Regular rate and rhythm, no murmurs / rubs / gallops. No extremity edema.  Left AV fistula currently bandaged. Abdomen: no tenderness, no masses palpated. No hepatosplenomegaly. Bowel sounds positive.  Musculoskeletal: no clubbing / cyanosis. No joint deformity upper and lower extremities. Good ROM, no contractures. Normal muscle tone.  Skin: no rashes, lesions, ulcers. No induration Neurologic: CN 2-12 grossly intact.  Strength 5/5 in all 4.  Psychiatric: Normal judgment and insight. Alert and oriented x 3. Normal mood.   Data Reviewed:   Reviewed available labs, imaging, and pertinent records.   Family Communication: Family updated at bedside Primary team communication:  Thank you very much for involving [place] in the care of your patient.  Author: Clydie Braun, MD 04/30/2023 4:37 PM  For on call review [email or web address].

## 2023-04-30 NOTE — Op Note (Signed)
NAME: Felicia Williams    MRN: [id number] DOB: [birth year]    DATE OF OPERATION: 04/30/2023  PREOP DIAGNOSIS:    Fistula malfunction  POSTOP DIAGNOSIS:    Same  PROCEDURE:    Left arm AV graft revision-Artegraft interposition bypass  SURGEON: Victorino Sparrow  ASSIST: Emilie Rutter, PA  ANESTHESIA: General  EBL: 50 mL  INDICATIONS:    Felicia Williams is a 82 y.o. female who has a complex medical history with end-stage renal disease.  She is currently being dialyzed through a left arm AV graft.  She recently had an infiltration episode leading to hematoma.  The hematoma has resolved, however she continues to have some drainage from an access site.  We discussed in clinic that I am concerned that this could be underlying infection.  After discussing the risks and benefits of left arm fistula revision, ulceration resection, Felicia Williams elected to proceed.  FINDINGS:   Left arm AV graft ulceration extending to the AV graft.  The AV graft was destroyed, and could not be repaired requiring interposition graft.  TECHNIQUE:   Patient brought to the OR laid in supine position.  No anesthesia was administered due to her respiratory status.  Lidocaine was brought into the field and injected in the upper arm surrounding the AV graft.  An ellipse incision was made around the ulceration, and the dermis was removed.  Upon removal, the graft was visible in the wound bed.  It was encountered, therefore pressure was held, the graft was exposed both proximally and distally and heparin was administered.  On inspection of the graft, it was completely destroyed, and appeared as though it had disintegrated.  This was not going to be usable, and furthermore, I thought that this section must have been infected.  Therefore it was resected.  The wound bed was irrigated with copious amounts of antibiotic saline.  Being that Felicia Williams was only under local anesthesia, and was not consented for tunneled dialysis  catheter placement, I elected to perform an interposition bypass graft in the wound bed.  Any devitalized tissue was resected, and Artegraft was brought onto the field.  Artegraft was sewn in end-to-end fashion using running 6-0 Prolene suture.  There was some thrombus near the axilla which was removed with the use of a Fogarty balloon.  Upon completion, there was an excellent thrill in the fistula.  There appeared to be healthy dermis and tissue surrounding.  After copious amounts of irrigation, the wound bed was closed using 3-0 Vicryl with staples at the level of the skin.  The fistula can continue to be accessed both proximally and distally away from the staple line.  Will follow-up cultures.  Victorino Sparrow, MD [facility] DATE OF DICTATION:   04/30/2023

## 2023-05-01 ENCOUNTER — Observation Stay (HOSPITAL_COMMUNITY): Payer: Medicare Other

## 2023-05-01 DIAGNOSIS — T829XXA Unspecified complication of cardiac and vascular prosthetic device, implant and graft, initial encounter: Secondary | ICD-10-CM | POA: Diagnosis present

## 2023-05-01 DIAGNOSIS — Z79891 Long term (current) use of opiate analgesic: Secondary | ICD-10-CM | POA: Diagnosis not present

## 2023-05-01 DIAGNOSIS — Z833 Family history of diabetes mellitus: Secondary | ICD-10-CM | POA: Diagnosis not present

## 2023-05-01 DIAGNOSIS — Z885 Allergy status to narcotic agent status: Secondary | ICD-10-CM | POA: Diagnosis not present

## 2023-05-01 DIAGNOSIS — G8929 Other chronic pain: Secondary | ICD-10-CM

## 2023-05-01 DIAGNOSIS — T827XXA Infection and inflammatory reaction due to other cardiac and vascular devices, implants and grafts, initial encounter: Secondary | ICD-10-CM | POA: Diagnosis present

## 2023-05-01 DIAGNOSIS — J90 Pleural effusion, not elsewhere classified: Secondary | ICD-10-CM | POA: Diagnosis present

## 2023-05-01 DIAGNOSIS — I509 Heart failure, unspecified: Secondary | ICD-10-CM | POA: Diagnosis not present

## 2023-05-01 DIAGNOSIS — M109 Gout, unspecified: Secondary | ICD-10-CM | POA: Diagnosis present

## 2023-05-01 DIAGNOSIS — Z8249 Family history of ischemic heart disease and other diseases of the circulatory system: Secondary | ICD-10-CM | POA: Diagnosis not present

## 2023-05-01 DIAGNOSIS — K219 Gastro-esophageal reflux disease without esophagitis: Secondary | ICD-10-CM | POA: Diagnosis present

## 2023-05-01 DIAGNOSIS — J9611 Chronic respiratory failure with hypoxia: Secondary | ICD-10-CM

## 2023-05-01 DIAGNOSIS — Z8269 Family history of other diseases of the musculoskeletal system and connective tissue: Secondary | ICD-10-CM | POA: Diagnosis not present

## 2023-05-01 DIAGNOSIS — Z9981 Dependence on supplemental oxygen: Secondary | ICD-10-CM | POA: Diagnosis not present

## 2023-05-01 DIAGNOSIS — K3184 Gastroparesis: Secondary | ICD-10-CM | POA: Diagnosis present

## 2023-05-01 DIAGNOSIS — L299 Pruritus, unspecified: Secondary | ICD-10-CM | POA: Diagnosis present

## 2023-05-01 DIAGNOSIS — J918 Pleural effusion in other conditions classified elsewhere: Secondary | ICD-10-CM | POA: Diagnosis present

## 2023-05-01 DIAGNOSIS — Z881 Allergy status to other antibiotic agents status: Secondary | ICD-10-CM | POA: Diagnosis not present

## 2023-05-01 DIAGNOSIS — N186 End stage renal disease: Secondary | ICD-10-CM

## 2023-05-01 DIAGNOSIS — Z992 Dependence on renal dialysis: Secondary | ICD-10-CM | POA: Diagnosis not present

## 2023-05-01 DIAGNOSIS — M898X9 Other specified disorders of bone, unspecified site: Secondary | ICD-10-CM | POA: Diagnosis present

## 2023-05-01 DIAGNOSIS — I10 Essential (primary) hypertension: Secondary | ICD-10-CM

## 2023-05-01 DIAGNOSIS — Y832 Surgical operation with anastomosis, bypass or graft as the cause of abnormal reaction of the patient, or of later complication, without mention of misadventure at the time of the procedure: Secondary | ICD-10-CM | POA: Diagnosis present

## 2023-05-01 DIAGNOSIS — D631 Anemia in chronic kidney disease: Secondary | ICD-10-CM | POA: Diagnosis present

## 2023-05-01 DIAGNOSIS — I5042 Chronic combined systolic (congestive) and diastolic (congestive) heart failure: Secondary | ICD-10-CM | POA: Diagnosis present

## 2023-05-01 DIAGNOSIS — E782 Mixed hyperlipidemia: Secondary | ICD-10-CM | POA: Diagnosis present

## 2023-05-01 DIAGNOSIS — I132 Hypertensive heart and chronic kidney disease with heart failure and with stage 5 chronic kidney disease, or end stage renal disease: Secondary | ICD-10-CM | POA: Diagnosis present

## 2023-05-01 HISTORY — PX: IR THORACENTESIS ASP PLEURAL SPACE W/IMG GUIDE: IMG5380

## 2023-05-01 LAB — BODY FLUID CELL COUNT WITH DIFFERENTIAL
Eos, Fluid: 41 %
Lymphs, Fluid: 33 %
Monocyte-Macrophage-Serous Fluid: 16 % — ABNORMAL LOW (ref 50–90)
Neutrophil Count, Fluid: 9 % (ref 0–25)
Other Cells, Fluid: 1 %
Total Nucleated Cell Count, Fluid: 546 uL (ref 0–1000)

## 2023-05-01 LAB — HEPATITIS B SURFACE ANTIGEN: Hepatitis B Surface Ag: NONREACTIVE

## 2023-05-01 LAB — RENAL FUNCTION PANEL
Albumin: 2.1 g/dL — ABNORMAL LOW (ref 3.5–5.0)
Anion gap: 15 (ref 5–15)
BUN: 74 mg/dL — ABNORMAL HIGH (ref 8–23)
CO2: 23 mmol/L (ref 22–32)
Calcium: 8.3 mg/dL — ABNORMAL LOW (ref 8.9–10.3)
Chloride: 98 mmol/L (ref 98–111)
Creatinine, Ser: 6.2 mg/dL — ABNORMAL HIGH (ref 0.44–1.00)
GFR, Estimated: 6 mL/min — ABNORMAL LOW (ref >60)
Glucose, Bld: 75 mg/dL (ref 70–99)
Phosphorus: 6.5 mg/dL — ABNORMAL HIGH (ref 2.5–4.6)
Potassium: 4.3 mmol/L (ref 3.5–5.1)
Sodium: 136 mmol/L (ref 135–145)

## 2023-05-01 LAB — LACTATE DEHYDROGENASE: LDH: 151 U/L (ref 98–192)

## 2023-05-01 LAB — CBC
HCT: 34.8 % — ABNORMAL LOW (ref 36.0–46.0)
Hemoglobin: 11 g/dL — ABNORMAL LOW (ref 12.0–15.0)
MCH: 27.6 pg (ref 26.0–34.0)
MCHC: 31.6 g/dL (ref 30.0–36.0)
MCV: 87.2 fL (ref 80.0–100.0)
Platelets: 213 10*3/uL (ref 150–400)
RBC: 3.99 MIL/uL (ref 3.87–5.11)
RDW: 17.7 % — ABNORMAL HIGH (ref 11.5–15.5)
WBC: 4.8 10*3/uL (ref 4.0–10.5)
nRBC: 0 % (ref 0.0–0.2)

## 2023-05-01 LAB — HEPATIC FUNCTION PANEL
ALT: 13 U/L (ref 0–44)
AST: 22 U/L (ref 15–41)
Albumin: 2.1 g/dL — ABNORMAL LOW (ref 3.5–5.0)
Alkaline Phosphatase: 71 U/L (ref 38–126)
Bilirubin, Direct: <0.1 mg/dL (ref 0.0–0.2)
Total Bilirubin: 0.5 mg/dL (ref 0.3–1.2)
Total Protein: 6.2 g/dL — ABNORMAL LOW (ref 6.5–8.1)

## 2023-05-01 LAB — LACTATE DEHYDROGENASE, PLEURAL OR PERITONEAL FLUID: LD, Fluid: 89 U/L — ABNORMAL HIGH (ref 3–23)

## 2023-05-01 LAB — PROTEIN, PLEURAL OR PERITONEAL FLUID: Total protein, fluid: 3.6 g/dL

## 2023-05-01 MED ORDER — LIDOCAINE HCL 1 % IJ SOLN
20.0000 mL | Freq: Once | INTRAMUSCULAR | Status: AC
  Administered 2023-05-01: 10 mL

## 2023-05-01 MED ORDER — CHLORHEXIDINE GLUCONATE CLOTH 2 % EX PADS: 6.0000 | MEDICATED_PAD | Freq: Every day | CUTANEOUS | Status: DC

## 2023-05-01 MED ORDER — OXYCODONE HCL 5 MG PO TABS
5.0000 mg | ORAL_TABLET | Freq: Four times a day (QID) | ORAL | Status: DC | PRN
  Administered 2023-05-01: 10 mg via ORAL
  Administered 2023-05-01: 5 mg via ORAL
  Administered 2023-05-02: 10 mg via ORAL
  Filled 2023-05-01: qty 2
  Filled 2023-05-01: qty 1
  Filled 2023-05-01: qty 2

## 2023-05-01 MED ORDER — LIDOCAINE HCL 1 % IJ SOLN
INTRAMUSCULAR | Status: AC
  Filled 2023-05-01: qty 20

## 2023-05-01 NOTE — Plan of Care (Signed)
  Problem: Education: Goal: Knowledge of General Education information will improve Description: Including pain rating scale, medication(s)/side effects and non-pharmacologic comfort measures 05/01/2023 0013 by Irwin Brakeman, RN Outcome: Progressing 04/30/2023 1614 by Irwin Brakeman, RN Outcome: Progressing   Problem: Health Behavior/Discharge Planning: Goal: Ability to manage health-related needs will improve 05/01/2023 0013 by Irwin Brakeman, RN Outcome: Progressing 04/30/2023 1614 by Irwin Brakeman, RN Outcome: Progressing   Problem: Clinical Measurements: Goal: Ability to maintain clinical measurements within normal limits will improve 05/01/2023 0013 by Irwin Brakeman, RN Outcome: Progressing 04/30/2023 1614 by Irwin Brakeman, RN Outcome: Progressing Goal: Will remain free from infection 05/01/2023 0013 by Irwin Brakeman, RN Outcome: Progressing 04/30/2023 1614 by Irwin Brakeman, RN Outcome: Progressing Goal: Diagnostic test results will improve 05/01/2023 0013 by Irwin Brakeman, RN Outcome: Progressing 04/30/2023 1614 by Irwin Brakeman, RN Outcome: Progressing Goal: Respiratory complications will improve 05/01/2023 0013 by Irwin Brakeman, RN Outcome: Progressing 04/30/2023 1614 by Irwin Brakeman, RN Outcome: Progressing Goal: Cardiovascular complication will be avoided 05/01/2023 0013 by Irwin Brakeman, RN Outcome: Progressing 04/30/2023 1614 by Irwin Brakeman, RN Outcome: Progressing   Problem: Activity: Goal: Risk for activity intolerance will decrease 05/01/2023 0013 by Irwin Brakeman, RN Outcome: Progressing 04/30/2023 1614 by Irwin Brakeman, RN Outcome: Progressing   Problem: Nutrition: Goal: Adequate nutrition will be maintained 05/01/2023 0013 by Irwin Brakeman, RN Outcome: Progressing 04/30/2023 1614 by Irwin Brakeman,  RN Outcome: Progressing   Problem: Coping: Goal: Level of anxiety will decrease 05/01/2023 0013 by Irwin Brakeman, RN Outcome: Progressing 04/30/2023 1614 by Irwin Brakeman, RN Outcome: Progressing   Problem: Elimination: Goal: Will not experience complications related to bowel motility 05/01/2023 0013 by Irwin Brakeman, RN Outcome: Progressing 04/30/2023 1614 by Irwin Brakeman, RN Outcome: Progressing Goal: Will not experience complications related to urinary retention 05/01/2023 0013 by Irwin Brakeman, RN Outcome: Progressing 04/30/2023 1614 by Irwin Brakeman, RN Outcome: Progressing   Problem: Pain Managment: Goal: General experience of comfort will improve 05/01/2023 0013 by Irwin Brakeman, RN Outcome: Progressing 04/30/2023 1614 by Irwin Brakeman, RN Outcome: Progressing   Problem: Safety: Goal: Ability to remain free from injury will improve 05/01/2023 0013 by Irwin Brakeman, RN Outcome: Progressing 04/30/2023 1614 by Irwin Brakeman, RN Outcome: Progressing   Problem: Skin Integrity: Goal: Risk for impaired skin integrity will decrease 05/01/2023 0013 by Irwin Brakeman, RN Outcome: Progressing 04/30/2023 1614 by Irwin Brakeman, RN Outcome: Progressing

## 2023-05-01 NOTE — Progress Notes (Signed)
Pt receives out-pt HD at [facility] on MWF 1:00 pm chair time. Clinicals faxed to clinic for continuation of care per clinic request. Will assist as needed.   Olivia Canter Renal Navigator [phone]

## 2023-05-01 NOTE — Progress Notes (Addendum)
  Progress Note    05/01/2023 9:36 AM 1 Day Post-Op  Subjective:  painful L arm   Vitals:   05/01/23 0500 05/01/23 0848  BP: (!) 152/57 (!) 178/62  Pulse: 68   Resp: 18   Temp: 98.4 F (36.9 C)   SpO2: 96% 97%   Physical Exam: Lungs:  non labored Incisions:  L arm incision c/d/i Extremities:  palpable L radial pulse; palpable thrill in distal upper arm; weaker thrill near axilla Neurologic: A&O  CBC    Component Value Date/Time   WBC 4.8 05/01/2023 0445   RBC 3.99 05/01/2023 0445   HGB 11.0 (L) 05/01/2023 0445   HCT 34.8 (L) 05/01/2023 0445   PLT 213 05/01/2023 0445   MCV 87.2 05/01/2023 0445   MCH 27.6 05/01/2023 0445   MCHC 31.6 05/01/2023 0445   RDW 17.7 (H) 05/01/2023 0445   LYMPHSABS 1.2 02/11/2023 0321   MONOABS 0.7 02/11/2023 0321   EOSABS 0.3 02/11/2023 0321   BASOSABS 0.1 02/11/2023 0321    BMET    Component Value Date/Time   NA 136 05/01/2023 0445   K 4.3 05/01/2023 0445   CL 98 05/01/2023 0445   CO2 23 05/01/2023 0445   GLUCOSE 75 05/01/2023 0445   BUN 74 (H) 05/01/2023 0445   CREATININE 6.20 (H) 05/01/2023 0445   CREATININE 5.47 (H) 08/16/2022 1437   CALCIUM 8.3 (L) 05/01/2023 0445   CALCIUM 7.1 (L) 01/10/2019 0632   GFRNONAA 6 (L) 05/01/2023 0445   GFRAA 11 (L) 01/20/2019 0608    INR    Component Value Date/Time   INR 1.2 01/29/2023 0032     Intake/Output Summary (Last 24 hours) at 05/01/2023 0936 Last data filed at 05/01/2023 0800 Gross per 24 hour  Intake 650 ml  Output 0 ml  Net 650 ml     Assessment/Plan:  82 y.o. female is s/p L arm AVG revision 1 Day Post-Op   L hand well perfused with palpable radial pulse Palpable thrill throughout AVG although stronger distal to surgery site Ok to cannulate AVG above and below incision today Continue broad spectrum abx; follow culture data Plans noted for thoracentesis with IR this morning   Emilie Rutter, PA-C [facility] [phone] 05/01/2023 9:36  AM   VASCULAR STAFF ADDENDUM: I have independently interviewed and examined the patient. I agree with the above.  Surgical site CDI Palpable pulse at the wrist Thrill in the graft  Victorino Sparrow MD [facility] Phone Number: [phone] 05/01/2023 2:07 PM

## 2023-05-01 NOTE — Procedures (Signed)
I was present at this dialysis session, have reviewed the session and made  appropriate changes Vinson Moselle MD  [facility] 05/01/2023, 4:08 PM

## 2023-05-01 NOTE — Progress Notes (Signed)
   05/01/23 1648  Vitals  Temp 97.8 F (36.6 C)  Pulse Rate 67  Resp (!) 28  BP (!) 172/61  SpO2 100 %  O2 Device Nasal Cannula  Weight 43 kg  Oxygen Therapy  Patient Activity (if Appropriate) In bed  Pulse Oximetry Type Continuous  Post Treatment  Dialyzer Clearance Lightly streaked  Hemodialysis Intake (mL) 0 mL  Liters Processed 63.4  Fluid Removed (mL) 1800 mL  Tolerated HD Treatment Yes   Received patient in bed to unit.  Alert and oriented.  Informed consent signed and in chart.   TX duration:2hrs  Patient tolerated well.  Transported back to the room  Alert, without acute distress.  Hand-off given to patient's nurse.   Access used: LAVG Access issues: none  Total UF removed: 1.8L Medication(s) given: none   [facility]

## 2023-05-01 NOTE — Progress Notes (Signed)
Pt went to HD today and was supposed to get Vancomycin at HD but was not given due to pt's low BP per HD nurse. Pt has not IV access, pt  and family do not want IV stick as much as possible. MD was informed, awaiting for MD's recommendation.

## 2023-05-01 NOTE — Consult Note (Addendum)
Renal Service Consult Note [facility]  Felicia Williams 05/01/2023 Felicia Krabbe, MD Requesting Physician: Dr. Sharolyn Williams  Reason for Consult: ESRD pt  HPI: The patient is a 82 y.o. year-old w/ PMH as below who presented to hospital for possible HD access infection. AVG was placed in 2023 by Dr Felicia Williams. Working well, but had an infiltration recently. The hematoma had resolved, however there continued to be a small amt of drainage from one of the access sites. No f/c/s. No steal symptoms. Gets HD MWF. Pt was admitted and taken to the OR yesterday morning. The AVG was found to have been destroyed/ disintegrated. What tissue was left was cleaned out thoroughly and copious irrigation was done. Since the pt was under sedation and TDC was not consented for, an Artegraft was put in the place of the old AVG and sewed into the remaining AVG branches end-to-end. The wound was closed and pt was admitted. We are asked to see for dialysis.    Pt seen in dialysis. No c/o's. New AVG seems to be working well. No c/o's. No SOB or CP.   ROS - denies CP, no joint pain, no HA, no blurry vision, no rash, no diarrhea, no nausea/ vomiting, no dysuria, no difficulty voiding   Past Medical History  Past Medical History:  Diagnosis Date   Anemia in ESRD (end-stage renal disease) (HCC) 08/18/2018   CHF (congestive heart failure) (HCC)    Coccyx contusion--with chronic pain due to fall    Depression    Dyspnea    02 2l via Felicia Williams   ESRD on hemodialysis (HCC)    Essential hypertension, benign    Gastric polyps    Gastroparesis    followed by Dr. Karilyn Williams.   GERD (gastroesophageal reflux disease)    Glomerulonephritis    Gout    Mixed hyperlipidemia    Spondylosis    Past Surgical History  Past Surgical History:  Procedure Laterality Date   APPENDECTOMY     AV FISTULA PLACEMENT Left 03/20/2022   Procedure: INSERTION OF LEFT UPPER ARM ARTERIOVENOUS (AV) GORE-TEX GRAFT;  Surgeon: Felicia Earthly, MD;   Location: [facility];  Service: Vascular;  Laterality: Left;   CATARACT EXTRACTION Bilateral    COLONOSCOPY N/A 12/28/2015   Procedure: COLONOSCOPY;  Surgeon: Felicia Hippo, MD;  Location: [facility];  Service: Endoscopy;  Laterality: N/A;  815   ESOPHAGOGASTRODUODENOSCOPY N/A 11/16/2020   Procedure: ESOPHAGOGASTRODUODENOSCOPY (EGD);  Surgeon: Felicia Hippo, MD;  Location: [facility];  Service: Endoscopy;  Laterality: N/A;  1:15   EXCHANGE OF A DIALYSIS CATHETER N/A 03/20/2022   Procedure: EXCHANGE OF A DIALYSIS CATHETER;  Surgeon: Felicia Earthly, MD;  Location: [facility];  Service: Vascular;  Laterality: N/A;   INSERTION OF DIALYSIS CATHETER Right 09/30/2021   Procedure: INSERTION OF TUNNELED DIALYSIS CATHETER;  Surgeon: Felicia Harman, MD;  Location: [facility] OR;  Service: Vascular;  Laterality: Right;   IR FLUORO GUIDE CV LINE RIGHT  01/16/2019   IR REMOVAL TUN CV CATH W/O FL  07/01/2019   IR THROMBECTOMY AV FISTULA W/THROMBOLYSIS/PTA INC/SHUNT/IMG LEFT Left 02/01/2023   IR US GUIDE VASC ACCESS LEFT  02/01/2023   IR US GUIDE VASC ACCESS RIGHT  01/16/2019   POLYPECTOMY  11/16/2020   Procedure: POLYPECTOMY;  Surgeon: Felicia Hippo, MD;  Location: [facility];  Service: Endoscopy;;  gastric   REMOVAL OF A DIALYSIS CATHETER  09/30/2021   Procedure: REMOVAL OF A PERITONEAL DIALYSIS CATHETER;  Surgeon: Felicia Harman, MD;  Location[facility] OR;  Service: Vascular;;   REMOVAL OF A DIALYSIS CATHETER N/A 05/22/2022   Procedure: MINOR REMOVAL OF A TUNNELED DIALYSIS CATHETER;  Surgeon: Felicia Earthly, MD;  Location: [facility];  Service: Vascular;  Laterality: N/A;   TUBAL LIGATION     Family History  Family History  Problem Relation Age of Onset   CAD Father    Heart attack Father    Diabetes Mellitus II Father    Hypertension Father    Lupus Brother    Social History  reports that she has never smoked. She has never been exposed to tobacco smoke. She has never used smokeless  tobacco. She reports that she does not drink alcohol and does not use drugs. Allergies  Allergies  Allergen Reactions   Cefepime Other (See Comments)    Feb 2023 Encephalopathy with questionable seizures. Seems to be tolerating cefazolin , but the daughter mentioned not to start her on any cephalosporin     Morphine Other (See Comments)    "Dry heaving like crazy"   Sulfa Antibiotics Other (See Comments)    Shut pt's kidneys down   Home medications Prior to Admission medications   Medication Sig Start Date End Date Taking? Authorizing Provider  carvedilol (COREG) 12.5 MG tablet Take 1 tablet by mouth 2 (two) times daily.   Yes [provider]  hydrALAZINE (APRESOLINE) 50 MG tablet Take 50 mg by mouth 2 (two) times daily. Do not take morning dose on Dialysis days. Monday,Wednesday and Friday   Yes [provider]  nitroGLYCERIN (NITROSTAT) 0.4 MG SL tablet Place 0.4 mg under the tongue every 5 (five) minutes as needed for chest pain. 12/28/22  Yes [provider]  oxyCODONE (OXY IR/ROXICODONE) 5 MG immediate release tablet Take 5 mg by mouth every 8 (eight) hours as needed. 04/09/23  Yes [provider]  traZODone (DESYREL) 100 MG tablet Take 100 mg by mouth at bedtime.   Yes [provider]     Vitals:   04/30/23 1626 04/30/23 2104 05/01/23 0500 05/01/23 0500  BP: (!) 164/48 (!) 166/55  (!) 152/57  Pulse:  69  68  Resp:  18  18  Temp: 97.6 F (36.4 C) 98.3 F (36.8 C)  98.4 F (36.9 C)  TempSrc: Oral Oral  Oral  SpO2: 97% 98%  96%  Weight:   43.2 kg   Height:       Exam Gen alert, no distress, elderly female No rash, cyanosis or gangrene Sclera anicteric, throat clear  No jvd or bruits Chest clear bilat to bases, no rales/ wheezing RRR no RG Abd soft ntnd no mass or ascites +bs GU defer MS no joint effusions or deformity Ext no LE or UE edema, no wounds or ulcers Neuro is alert, Ox 3 , nf    LUA AVG +accessed      Renal-related home meds: - coreg 12.5 bid - hydralazine 50 bid - others: oxy IR, trazodone    OP HD: [name] MWF  3h  400/500   1K/2.5Ca bath  Heparin 1000 bolus + 1200u/hr  L AVG - L AVG removed yest and has new Artegraft in same position - last dry wt 43.5 from July, needs updating    Assessment/ Plan: Possibly AVG infection - went to OR 10/8 yesterday and AVG was apparently infected and was removed. No fevers overnight. Fluid cx is no growth at 24 hrs. An Artegraft (bovine carotid artery)  was put back in the same position after extensive washing out.  ESRD - on HD MWF. HD today on schedule. We are using the new Artegraft w/o difficulty so far.  HTN/ BP - BP's normal to high. Cont home meds. Not vol overloaded on exam.  Volume - needs weights, euvolemic on exam. UF to dry wt.  Anemia esrd - Hb 11-12 here, no esa needs.  MBD ckd - CCa in range, phos a bit high. Follow.    Vinson Moselle  MD [facility] 05/01/2023, 8:54 AM  Recent Labs  Lab 04/30/23 0652 05/01/23 0445  HGB 12.2 11.0*  ALBUMIN  --  2.1*  2.1*  CALCIUM  --  8.3*  PHOS  --  6.5*  CREATININE 5.40* 6.20*  K 4.0 4.3   Inpatient medications:  carvedilol  12.5 mg Oral BID WC   heparin  5,000 Units Subcutaneous Q8H   hydrALAZINE  50 mg Oral Irregular times every day   lidocaine  20 mL Infiltration Once   sodium chloride flush  3 mL Intravenous Q12H   traZODone  100 mg Oral QHS    vancomycin     acetaminophen **OR** acetaminophen, albuterol, hydrALAZINE, hydrOXYzine, oxyCODONE

## 2023-05-01 NOTE — Progress Notes (Signed)
PROGRESS NOTE  Felicia Williams NFA:[id number] DOB: [birth year] DOA: 04/30/2023 PCP: Rebekah Chesterfield, NP  HPI/Recap of past 24 hours: Felicia Williams is a 82 y.o. 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 Arbie Cookey back in 2023 who presented due to draining ulcer of her AV fistula.  Recently the graft was infiltrated leading to a hematoma formation.  Hematoma resolved, but there continues to be a small amount of drainage from one of the access sites. She does report having a chronic nonproductive cough that has been present for over a year. Chest x-ray had noted a large left pleural effusion and small right pleural effusion.  Patient underwent left arm fistula revision with interpositional artegraft with Dr. Kathe Becton on 10/8. Patient had been given vancomycin perioperatively. TRH was consulted due to need for thoracentesis and to assess if hemodialysis is possible with the revision.  There was concern for possibility of a infected eschar.  Blood cultures was sent.    Today, saw patient after thoracentesis, complaining about pain in multiple sites.  Denies any worsening shortness of breath, abdominal pain, nausea/vomiting, fever/chills.  Chest x-ray post thoracentesis negative for any acute issues including pneumothorax.  Daughter at bedside.     Assessment/Plan: Principal Problem:   Complication of AV dialysis fistula Active Problems:   Pleural effusion due to CHF (congestive heart failure) (HCC)   Chronic respiratory failure with hypoxia (HCC)   ESRD on dialysis [facility])   Essential hypertension, benign   Chronic pain   Complication of L AV fistula Possible infected L AV fistula site Currently afebrile with no leukocytosis Patient underwent left arm fistula revision with interpositional artegraft with Dr. Sherral Hammers on 10/8 Blood cultures still pending collection,  follow Continue Vancomycin per pharmacy Pain management   Large left pleural effusion s/p L thoracentesis on 10/9 Noted large left pleural effusion and small right pleural effusion s/p L thoracentesis yielding 800 ml of yellow pleural fluid Noted transudative effusion Last echocardiogram noted EF to be 35 to 40% with grade 1 diastolic dysfunction back on 02/11/2023 Management otherwise with hemodialysis  Chronic systolic and diastolic HF EF noted to be 35 to 40% with grade 1 diastolic dysfunction back in 02/11/2023 Fluid management with HD   Chronic respiratory failure with hypoxia Patient on 2 L of nasal cannula oxygen at baseline at home Continue nasal cannula oxygen maintain O2 saturations above 90%   ESRD on HD Patient normally dialyzes Monday, Wednesday, Friday. Dr. Arlean Hopping of nephrology on board   Essential hypertension BP stable Continue Coreg and hydralazine Hydralazine IV as needed.   Chronic pain Patient is on chronic opioids at baseline Continue oxycodone    Estimated body mass index is 19.99 kg/m as calculated from the following:   Height as of this encounter: 4\' 11"  (1.499 m).   Weight as of this encounter: 44.9 kg.     Code Status: Full  Family Communication: Daughter at bedside  Disposition Plan: Status is: Inpatient Remains inpatient appropriate because: level of care      Consultants: TRH  Procedures: left arm fistula revision on 10/8  Antimicrobials: Vancomycin  DVT prophylaxis:  Heparin   Objective: Vitals:   05/01/23 1317 05/01/23 1335 05/01/23 1400 05/01/23 1430  BP: (!) 142/50 (!) 159/47 (!) 116/45 (!) 110/48  Pulse: 72 73 66 68  Resp: 20 19 17 17   Temp: 97.7 F (  36.5 C)     TempSrc:      SpO2: 98% 98% 99% 99%  Weight: 44.9 kg     Height:        Intake/Output Summary (Last 24 hours) at 05/01/2023 1454 Last data filed at 05/01/2023 0800 Gross per 24 hour  Intake 650 ml  Output 0 ml  Net 650 ml   Filed Weights    04/30/23 0553 05/01/23 0500 05/01/23 1317  Weight: 43.1 kg 43.2 kg 44.9 kg    Exam: General: NAD, chronically ill appearing Cardiovascular: S1, S2 present Respiratory: CTAB Abdomen: Soft, nontender, nondistended, bowel sounds present Musculoskeletal: No bilateral pedal edema noted, L arm with ace wrap dressing c/d/i Skin: Normal Psychiatry: Fair mood     Data Reviewed: CBC: Recent Labs  Lab 04/30/23 0652 05/01/23 0445  WBC  --  4.8  HGB 12.2 11.0*  HCT 36.0 34.8*  MCV  --  87.2  PLT  --  213   Basic Metabolic Panel: Recent Labs  Lab 04/30/23 0652 05/01/23 0445  NA 138 136  K 4.0 4.3  CL 100 98  CO2  --  23  GLUCOSE 81 75  BUN 55* 74*  CREATININE 5.40* 6.20*  CALCIUM  --  8.3*  PHOS  --  6.5*   GFR: Estimated Creatinine Clearance: 4.7 mL/min (A) (by C-G formula based on SCr of 6.2 mg/dL (H)). Liver Function Tests: Recent Labs  Lab 05/01/23 0445  AST 22  ALT 13  ALKPHOS 71  BILITOT 0.5  PROT 6.2*  ALBUMIN 2.1*  2.1*   No results for input(s): "LIPASE", "AMYLASE" in the last 168 hours. No results for input(s): "AMMONIA" in the last 168 hours. Coagulation Profile: No results for input(s): "INR", "PROTIME" in the last 168 hours. Cardiac Enzymes: No results for input(s): "CKTOTAL", "CKMB", "CKMBINDEX", "TROPONINI" in the last 168 hours. BNP (last 3 results) No results for input(s): "PROBNP" in the last 8760 hours. HbA1C: No results for input(s): "HGBA1C" in the last 72 hours. CBG: No results for input(s): "GLUCAP" in the last 168 hours. Lipid Profile: No results for input(s): "CHOL", "HDL", "LDLCALC", "TRIG", "CHOLHDL", "LDLDIRECT" in the last 72 hours. Thyroid Function Tests: No results for input(s): "TSH", "T4TOTAL", "FREET4", "T3FREE", "THYROIDAB" in the last 72 hours. Anemia Panel: No results for input(s): "VITAMINB12", "FOLATE", "FERRITIN", "TIBC", "IRON", "RETICCTPCT" in the last 72 hours. Urine analysis:    Component Value Date/Time    COLORURINE COLORLESS (A) 08/29/2021 0827   APPEARANCEUR CLEAR 08/29/2021 0827   LABSPEC 1.005 08/29/2021 0827   PHURINE 8.0 08/29/2021 0827   GLUCOSEU >=500 (A) 08/29/2021 0827   HGBUR NEGATIVE 08/29/2021 0827   BILIRUBINUR NEGATIVE 08/29/2021 0827   KETONESUR NEGATIVE 08/29/2021 0827   PROTEINUR 30 (A) 08/29/2021 0827   UROBILINOGEN 0.2 01/11/2014 1635   NITRITE NEGATIVE 08/29/2021 0827   LEUKOCYTESUR NEGATIVE 08/29/2021 0827   Sepsis Labs: @LABRCNTIP (procalcitonin:4,lacticidven:4)  ) Recent Results (from the past 240 hour(s))  Aerobic/Anaerobic Culture w Gram Stain (surgical/deep wound)     Status: None (Preliminary result)   Collection Time: 04/30/23  8:11 AM   Specimen: Wound  Result Value Ref Range Status   Specimen Description WOUND  Final   Special Requests LEFT ARM WOUND  Final   Gram Stain NO WBC SEEN NO ORGANISMS SEEN   Final   Culture   Final    NO GROWTH < 24 HOURS Performed at [facility], [address], [place], [place] [address]    Report Status PENDING  Incomplete  MRSA Next Gen by PCR, Nasal     Status: None   Collection Time: 04/30/23  8:55 PM   Specimen: Nasal Mucosa; Nasal Swab  Result Value Ref Range Status   MRSA by PCR Next Gen NOT DETECTED NOT DETECTED Final    Comment: (NOTE) The GeneXpert MRSA Assay (FDA approved for NASAL specimens only), is one component of a comprehensive MRSA colonization surveillance program. It is not intended to diagnose MRSA infection nor to guide or monitor treatment for MRSA infections. Test performance is not FDA approved in patients less than 69 years old. Performed at [facility], [address] [place], [place] [address]       Studies: IR THORACENTESIS ASP PLEURAL SPACE W/IMG GUIDE  Result Date: 05/01/2023 INDICATION: 82 year old female with ESRD on HD via left arm AV graft which developed ulceration requiring repair, now presenting with left pleural effusion. Request made for diagnostic and  therapeutic left thoracentesis. EXAM: ULTRASOUND GUIDED LEFT THORACENTESIS MEDICATIONS: 10 mL 1% lidocaine COMPLICATIONS: None immediate. PROCEDURE: An ultrasound guided thoracentesis was thoroughly discussed with the patient and questions answered. The benefits, risks, alternatives and complications were also discussed. The patient understands and wishes to proceed with the procedure. Written consent was obtained. Ultrasound was performed to localize and mark an adequate pocket of fluid within the left chest. The area was then prepped and draped in the normal sterile fashion. 1% Lidocaine was used for local anesthesia. Under ultrasound guidance a 6 Fr Safe-T-Centesis catheter was introduced. Thoracentesis was performed. The catheter was removed and a dressing applied. FINDINGS: A total of approximately 800 mL of yellow fluid was removed. Samples were sent to the laboratory as requested by the clinical team. IMPRESSION: Successful ultrasound guided left thoracentesis yielding 800 mL of pleural fluid. Performed by: Loyce Dys PA-C Electronically Signed   By: Simonne Come M.D.   On: 05/01/2023 12:35   DG Chest 1 View  Result Date: 05/01/2023 CLINICAL DATA:  Status post left thoracentesis EXAM: CHEST  1 VIEW COMPARISON:  04/30/2023 FINDINGS: Substantial reduction in size of the left pleural effusion. Continued obscuration of the left heart border compatible with some residual pleural effusion. At least a small right pleural effusion noted. Interstitial accentuation in the lungs compatible with interstitial edema. Atherosclerotic calcification of the aortic arch. No pneumothorax. Bony demineralization noted. IMPRESSION: 1. Substantial reduction in size of the left pleural effusion, without pneumothorax. 2. Interstitial edema and at least a small right pleural effusion. 3. Bony demineralization. Electronically Signed   By: Gaylyn Rong M.D.   On: 05/01/2023 10:21    Scheduled Meds:  carvedilol  12.5 mg  Oral BID WC   Chlorhexidine Gluconate Cloth  6 each Topical Q0600   heparin  5,000 Units Subcutaneous Q8H   hydrALAZINE  50 mg Oral Irregular times every day   sodium chloride flush  3 mL Intravenous Q12H   traZODone  100 mg Oral QHS    Continuous Infusions:  vancomycin       LOS: 1 day     Briant Cedar, MD [facility]  If 7PM-7AM, please contact night-coverage [email or web address] 05/01/2023, 2:54 PM

## 2023-05-02 ENCOUNTER — Other Ambulatory Visit (HOSPITAL_COMMUNITY): Payer: Self-pay

## 2023-05-02 DIAGNOSIS — T829XXA Unspecified complication of cardiac and vascular prosthetic device, implant and graft, initial encounter: Secondary | ICD-10-CM | POA: Diagnosis not present

## 2023-05-02 DIAGNOSIS — J9611 Chronic respiratory failure with hypoxia: Secondary | ICD-10-CM | POA: Diagnosis not present

## 2023-05-02 DIAGNOSIS — I509 Heart failure, unspecified: Secondary | ICD-10-CM | POA: Diagnosis not present

## 2023-05-02 DIAGNOSIS — N186 End stage renal disease: Secondary | ICD-10-CM | POA: Diagnosis not present

## 2023-05-02 LAB — RENAL FUNCTION PANEL
Albumin: 2.1 g/dL — ABNORMAL LOW (ref 3.5–5.0)
Anion gap: 11 (ref 5–15)
BUN: 31 mg/dL — ABNORMAL HIGH (ref 8–23)
CO2: 30 mmol/L (ref 22–32)
Calcium: 8.8 mg/dL — ABNORMAL LOW (ref 8.9–10.3)
Chloride: 93 mmol/L — ABNORMAL LOW (ref 98–111)
Creatinine, Ser: 4.2 mg/dL — ABNORMAL HIGH (ref 0.44–1.00)
GFR, Estimated: 10 mL/min — ABNORMAL LOW (ref >60)
Glucose, Bld: 77 mg/dL (ref 70–99)
Phosphorus: 4.7 mg/dL — ABNORMAL HIGH (ref 2.5–4.6)
Potassium: 3.8 mmol/L (ref 3.5–5.1)
Sodium: 134 mmol/L — ABNORMAL LOW (ref 135–145)

## 2023-05-02 LAB — HEPATITIS B SURFACE ANTIBODY, QUANTITATIVE: Hep B S AB Quant (Post): <3.5 m[IU]/mL — ABNORMAL LOW

## 2023-05-02 LAB — PATHOLOGIST SMEAR REVIEW

## 2023-05-02 MED ORDER — OXYCODONE HCL 5 MG PO TABS
5.0000 mg | ORAL_TABLET | Freq: Four times a day (QID) | ORAL | 0 refills | Status: DC | PRN
Start: 2023-05-02 — End: 2023-05-23
  Filled 2023-05-02: qty 15, 4d supply, fill #0

## 2023-05-02 MED ORDER — DOXYCYCLINE HYCLATE 100 MG PO TABS
100.0000 mg | ORAL_TABLET | Freq: Two times a day (BID) | ORAL | Status: DC
  Administered 2023-05-02: 100 mg via ORAL
  Filled 2023-05-02: qty 1

## 2023-05-02 MED ORDER — DOXYCYCLINE HYCLATE 100 MG PO TABS
100.0000 mg | ORAL_TABLET | Freq: Two times a day (BID) | ORAL | 0 refills | Status: DC
  Filled 2023-05-02: qty 28, 14d supply, fill #0

## 2023-05-02 NOTE — Progress Notes (Signed)
PROGRESS NOTE  Felicia Williams ZDG:[id number] DOB: 1941/02/02 DOA: 04/30/2023 PCP: Rebekah Chesterfield, NP  HPI/Recap of past 24 hours: Felicia Williams is a 82 y.o. 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 Arbie Cookey back in 2023 who presented due to draining ulcer of her AV fistula.  Recently the graft was infiltrated leading to a hematoma formation.  Hematoma resolved, but there continues to be a small amount of drainage from one of the access sites. She does report having a chronic nonproductive cough that has been present for over a year. Chest x-ray had noted a large left pleural effusion and small right pleural effusion.  Patient underwent left arm fistula revision with interpositional artegraft with Dr. Kathe Becton on 10/8. Patient had been given vancomycin perioperatively. TRH was consulted due to need for thoracentesis and to assess if hemodialysis is possible with the revision.  There was concern for possibility of a infected eschar.  Blood cultures was sent.    Today, pt denies any new complaints, denies any chest pain, worsening SOB, abdominal pain, N/V/D, fever/chills. Pt advised to be compliant with meds, follow up appointments and dialysis.      Assessment/Plan: Principal Problem:   Complication of AV dialysis fistula Active Problems:   Pleural effusion due to CHF (congestive heart failure) (HCC)   Chronic respiratory failure with hypoxia (HCC)   ESRD on dialysis [facility])   Essential hypertension, benign   Chronic pain   Complication of L AV fistula Possible infected L AV fistula site Currently afebrile with no leukocytosis Patient underwent left arm fistula revision with interpositional artegraft with Dr. Sherral Hammers on 10/8 Blood cultures still pending, vascular/nephrology will follow S/p Vancomycin, discharged on doxycyline as per vascular Pain  management as per vascular   Large left pleural effusion s/p L thoracentesis on 10/9 Noted large left pleural effusion and small right pleural effusion s/p L thoracentesis yielding 800 ml of yellow pleural fluid Noted transudative effusion, likely from CHF, no concern for infection Last echocardiogram noted EF to be 35 to 40% with grade 1 diastolic dysfunction back on 02/11/2023 Management otherwise with hemodialysis Follow up with cardiology  Chronic systolic and diastolic HF EF noted to be 35 to 40% with grade 1 diastolic dysfunction back in 02/11/2023 Fluid management with HD Follow up with cardiology   Chronic respiratory failure with hypoxia Patient on 2 L of nasal cannula oxygen at baseline at home Continue nasal cannula oxygen maintain O2 saturations above 90%   ESRD on HD Patient normally dialyzes Monday, Wednesday, Friday   Essential hypertension BP stable Continue Coreg and hydralazine  Chronic pain Patient is on chronic opioids at baseline Continue oxycodone    Estimated body mass index is 19.15 kg/m as calculated from the following:   Height as of this encounter: 4\' 11"  (1.499 m).   Weight as of this encounter: 43 kg.      TRH will sign off as patient is discharged as discussed with vascular surgery      Code Status: Full  Family Communication: Daughter at bedside  Disposition Plan: Status is: Inpatient Remains inpatient appropriate because: level of care      Consultants: TRH  Procedures: left arm fistula revision on 10/8  Antimicrobials: Doxycycline  DVT prophylaxis:  Heparin   Objective: Vitals:   05/01/23 1747 05/01/23 2112 05/02/23 0449 05/02/23 0925  BP: (!) 145/49 Marland Kitchen)  156/62 (!) 157/55 (!) 131/42  Pulse: 74 82 64 67  Resp: 20   20  Temp: 98 F (36.7 C) 99.6 F (37.6 C) 97.7 F (36.5 C) 98.4 F (36.9 C)  TempSrc: Axillary Oral Oral Oral  SpO2: 100% 96% 97% 98%  Weight:      Height:        Intake/Output Summary (Last  24 hours) at 05/02/2023 1247 Last data filed at 05/02/2023 0920 Gross per 24 hour  Intake 360 ml  Output 1800 ml  Net -1440 ml   Filed Weights   05/01/23 0500 05/01/23 1317 05/01/23 1648  Weight: 43.2 kg 44.9 kg 43 kg    Exam: General: NAD Cardiovascular: S1, S2 present Respiratory: CTAB Abdomen: Soft, nontender, nondistended, bowel sounds present Musculoskeletal: No bilateral pedal edema noted, L arm fistula with dressing c/d/i Skin: Normal Psychiatry: Normal mood     Data Reviewed: CBC: Recent Labs  Lab 04/30/23 0652 05/01/23 0445  WBC  --  4.8  HGB 12.2 11.0*  HCT 36.0 34.8*  MCV  --  87.2  PLT  --  213   Basic Metabolic Panel: Recent Labs  Lab 04/30/23 0652 05/01/23 0445 05/02/23 0531  NA 138 136 134*  K 4.0 4.3 3.8  CL 100 98 93*  CO2  --  23 30  GLUCOSE 81 75 77  BUN 55* 74* 31*  CREATININE 5.40* 6.20* 4.20*  CALCIUM  --  8.3* 8.8*  PHOS  --  6.5* 4.7*   GFR: Estimated Creatinine Clearance: 6.9 mL/min (A) (by C-G formula based on SCr of 4.2 mg/dL (H)). Liver Function Tests: Recent Labs  Lab 05/01/23 0445 05/02/23 0531  AST 22  --   ALT 13  --   ALKPHOS 71  --   BILITOT 0.5  --   PROT 6.2*  --   ALBUMIN 2.1*  2.1* 2.1*   No results for input(s): "LIPASE", "AMYLASE" in the last 168 hours. No results for input(s): "AMMONIA" in the last 168 hours. Coagulation Profile: No results for input(s): "INR", "PROTIME" in the last 168 hours. Cardiac Enzymes: No results for input(s): "CKTOTAL", "CKMB", "CKMBINDEX", "TROPONINI" in the last 168 hours. BNP (last 3 results) No results for input(s): "PROBNP" in the last 8760 hours. HbA1C: No results for input(s): "HGBA1C" in the last 72 hours. CBG: No results for input(s): "GLUCAP" in the last 168 hours. Lipid Profile: No results for input(s): "CHOL", "HDL", "LDLCALC", "TRIG", "CHOLHDL", "LDLDIRECT" in the last 72 hours. Thyroid Function Tests: No results for input(s): "TSH", "T4TOTAL", "FREET4",  "T3FREE", "THYROIDAB" in the last 72 hours. Anemia Panel: No results for input(s): "VITAMINB12", "FOLATE", "FERRITIN", "TIBC", "IRON", "RETICCTPCT" in the last 72 hours. Urine analysis:    Component Value Date/Time   COLORURINE COLORLESS (A) 08/29/2021 0827   APPEARANCEUR CLEAR 08/29/2021 0827   LABSPEC 1.005 08/29/2021 0827   PHURINE 8.0 08/29/2021 0827   GLUCOSEU >=500 (A) 08/29/2021 0827   HGBUR NEGATIVE 08/29/2021 0827   BILIRUBINUR NEGATIVE 08/29/2021 0827   KETONESUR NEGATIVE 08/29/2021 0827   PROTEINUR 30 (A) 08/29/2021 0827   UROBILINOGEN 0.2 01/11/2014 1635   NITRITE NEGATIVE 08/29/2021 0827   LEUKOCYTESUR NEGATIVE 08/29/2021 0827   Sepsis Labs: @LABRCNTIP (procalcitonin:4,lacticidven:4)  ) Recent Results (from the past 240 hour(s))  Aerobic/Anaerobic Culture w Gram Stain (surgical/deep wound)     Status: None (Preliminary result)   Collection Time: 04/30/23  8:11 AM   Specimen: Wound  Result Value Ref Range Status   Specimen Description WOUND  Final  Special Requests LEFT ARM WOUND  Final   Gram Stain NO WBC SEEN NO ORGANISMS SEEN   Final   Culture   Final    NO GROWTH 2 DAYS NO ANAEROBES ISOLATED; CULTURE IN PROGRESS FOR 5 DAYS Performed at [facility], [address] [place], [place] [address]    Report Status PENDING  Incomplete  MRSA Next Gen by PCR, Nasal     Status: None   Collection Time: 04/30/23  8:55 PM   Specimen: Nasal Mucosa; Nasal Swab  Result Value Ref Range Status   MRSA by PCR Next Gen NOT DETECTED NOT DETECTED Final    Comment: (NOTE) The GeneXpert MRSA Assay (FDA approved for NASAL specimens only), is one component of a comprehensive MRSA colonization surveillance program. It is not intended to diagnose MRSA infection nor to guide or monitor treatment for MRSA infections. Test performance is not FDA approved in patients less than 18 years old. Performed at [facility], [address], [place], [place] [address]        Studies: No results found.  Scheduled Meds:  carvedilol  12.5 mg Oral BID WC   Chlorhexidine Gluconate Cloth  6 each Topical Q0600   doxycycline  100 mg Oral Q12H   heparin  5,000 Units Subcutaneous Q8H   hydrALAZINE  50 mg Oral Irregular times every day   sodium chloride flush  3 mL Intravenous Q12H   traZODone  100 mg Oral QHS    Continuous Infusions:     LOS: 2 days     Briant Cedar, MD [facility]  If 7PM-7AM, please contact night-coverage [email or web address] 05/02/2023, 12:47 PM

## 2023-05-02 NOTE — Progress Notes (Signed)
D/C order noted. Contacted [name] to advise clinic of pt's d/c today and that pt should resume care tomorrow. Will fax d/c summary to clinic once available.   Olivia Canter Renal Navigator [phone]

## 2023-05-02 NOTE — Progress Notes (Signed)
  Progress Note    05/02/2023 1:43 PM 2 Days Post-Op  Subjective:  feeling much better today after thoracentesis   Vitals:   05/02/23 0449 05/02/23 0925  BP: (!) 157/55 (!) 131/42  Pulse: 64 67  Resp:  20  Temp: 97.7 F (36.5 C) 98.4 F (36.9 C)  SpO2: 97% 98%   Physical Exam: Lungs:  non labored Incisions:  L arm incision c/d/i Extremities:  palpable radial pulse; palpable thrill through AVG Neurologic: A&O  CBC    Component Value Date/Time   WBC 4.8 05/01/2023 0445   RBC 3.99 05/01/2023 0445   HGB 11.0 (L) 05/01/2023 0445   HCT 34.8 (L) 05/01/2023 0445   PLT 213 05/01/2023 0445   MCV 87.2 05/01/2023 0445   MCH 27.6 05/01/2023 0445   MCHC 31.6 05/01/2023 0445   RDW 17.7 (H) 05/01/2023 0445   LYMPHSABS 1.2 02/11/2023 0321   MONOABS 0.7 02/11/2023 0321   EOSABS 0.3 02/11/2023 0321   BASOSABS 0.1 02/11/2023 0321    BMET    Component Value Date/Time   NA 134 (L) 05/02/2023 0531   K 3.8 05/02/2023 0531   CL 93 (L) 05/02/2023 0531   CO2 30 05/02/2023 0531   GLUCOSE 77 05/02/2023 0531   BUN 31 (H) 05/02/2023 0531   CREATININE 4.20 (H) 05/02/2023 0531   CREATININE 5.47 (H) 08/16/2022 1437   CALCIUM 8.8 (L) 05/02/2023 0531   CALCIUM 7.1 (L) 01/10/2019 0632   GFRNONAA 10 (L) 05/02/2023 0531   GFRAA 11 (L) 01/20/2019 0608    INR    Component Value Date/Time   INR 1.2 01/29/2023 0032     Intake/Output Summary (Last 24 hours) at 05/02/2023 1343 Last data filed at 05/02/2023 0920 Gross per 24 hour  Intake 360 ml  Output 1800 ml  Net -1440 ml     Assessment/Plan:  82 y.o. female is s/p L arm AVG revision 2 Days Post-Op   -Subjectively feeling much better after thoracentesis -L arm AVG was able to be cannulated above and below incision for a full HD treatment yesterday evening without issue -Trouble with IV access last night; given no growth to date on intra-op cultures we will switch from IV vanc to p.o. doxycycline.  She will be prescribed a 2 week  course -Ready for discharge today; office will arrange staple removal in 2-3 weeks   Emilie Rutter, PA-C [facility] [phone] 05/02/2023 1:43 PM

## 2023-05-02 NOTE — Discharge Instructions (Signed)

## 2023-05-02 NOTE — Plan of Care (Signed)

## 2023-05-02 NOTE — Progress Notes (Signed)
DISCHARGE NOTE HOME Felicia Williams to be discharged Home per MD order. Discussed prescriptions and follow up appointments with the patient. Prescriptions given to patient; medication list explained in detail. Patient verbalized understanding.  Skin clean, dry and intact without evidence of skin break down, no evidence of skin tears noted. IV catheter discontinued intact. Site without signs and symptoms of complications. Dressing and pressure applied. Pt denies pain at the site currently. No complaints noted.  Patient free of lines, drains, and wounds.   An After Visit Summary (AVS) was printed and given to the patient. Patient escorted via wheelchair, and discharged home via private auto.  Irwin Brakeman, RN

## 2023-05-02 NOTE — TOC Transition Note (Addendum)
Transition of Care [facility]) - CM/SW Discharge Note   Patient Details  Name: Felicia Williams MRN: [id number] Date of Birth: 31-Oct-1940  Transition of Care [facility]) CM/SW Contact:  Tom-Johnson, Hershal Coria, RN Phone Number: 05/02/2023, 12:40 PM   Clinical Narrative:     Patient is scheduled for discharge today.  Readmission Risk Assessment done. Outpatient f/u, hospital f/u and discharge instructions on AVS. Prescriptions sent to [facility] pharmacy and meds will be delivered to patient at bedside prior discharge. Patient on 2L O2 at home from [place].  Continue out-pt HD at [facility] on MWF schedule.  No TOC needs or recommendations noted. Daughter, Pam to transport at discharge.  No further TOC needs noted.  13:15- CM notified by PT that home health PT will be recommended, patient has no preference. CM sent referral to [facility] and Tresa Endo noted acceptance, info on AVS.  No further TOC needs noted.       Final next level of care: Home/Self Care Barriers to Discharge: Barriers Resolved   Patient Goals and CMS Choice CMS Medicare.gov Compare Post Acute Care list provided to:: Patient Choice offered to / list presented to : NA  Discharge Placement                  Patient to be transferred to facility by: Daughter Name of family member notified: Pam    Discharge Plan and Services Additional resources added to the After Visit Summary for                  DME Arranged: N/A DME Agency: NA       HH Arranged: NA HH Agency: NA        Social Determinants of Health (SDOH) Interventions SDOH Screenings   Food Insecurity: No Food Insecurity (04/30/2023)  Housing: Patient Declined (04/30/2023)  Transportation Needs: No Transportation Needs (04/30/2023)  Utilities: Not At Risk (04/30/2023)  Tobacco Use: Low Risk  (04/30/2023)     Readmission Risk Interventions    05/02/2023   12:37 PM 01/31/2023    9:54 AM 10/25/2022    8:06 AM  Readmission Risk Prevention Plan   Transportation Screening Complete Complete Complete  PCP or Specialist Appt within 3-5 Days Complete    HRI or Home Care Consult Complete Complete Complete  Social Work Consult for Recovery Care Planning/Counseling Complete Complete Complete  Palliative Care Screening Not Applicable Not Applicable Not Applicable  Medication Review Oceanographer) Referral to Pharmacy Complete Complete

## 2023-05-02 NOTE — Progress Notes (Signed)
Dialysis went well yesterday.  Plan for outpt follow up  Cultures remain negative.    Victorino Sparrow MD

## 2023-05-02 NOTE — Evaluation (Signed)
Physical Therapy Evaluation Patient Details Name: Felicia Williams MRN: [id number] DOB: March 15, 1941 Today's Date: 05/02/2023  History of Present Illness  Pt is an 82 y.o. female who presented 04/30/23 for L arm AV graft revision. 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 PTA, she was mod I utilizing a rollator for functional mobility. She resides with her son and daughter-in-law in a house with x1 small STE. There is a basement but she does not need to access it. Currently, pt is limited by pain "all over" and displays deficits in balance, generalized strength, and activity tolerance. She was able to transfer to stand and ambulate up to ~75 ft using a RW with CGA for safety before she fatigued. Pt would benefit from further acute PT and follow-up HHPT to maximize her return to baseline.    If plan is discharge home, recommend the following: A little help with bathing/dressing/bathroom;Assistance with cooking/housework;Assist for transportation;Help with stairs or ramp for entrance   Can travel by private vehicle        Equipment Recommendations None recommended by PT  Recommendations for Other Services       Functional Status Assessment Patient has had a recent decline in their functional status and demonstrates the ability to make significant improvements in function in a reasonable and predictable amount of time.     Precautions / Restrictions Precautions Precautions: Fall;Other (comment) Precaution Comments: on 2L O2 baseline Restrictions Weight Bearing Restrictions: No      Mobility  Bed Mobility               General bed mobility comments: Pt up in recliner upon arrival    Transfers Overall transfer level: Needs assistance Equipment used: Rolling walker (2 wheels) Transfers: Sit to/from Stand Sit to Stand:  Contact guard assist           General transfer comment: Extra time to scoot forward and push up from chair to stand, CGA for safety    Ambulation/Gait Ambulation/Gait assistance: Contact guard assist Gait Distance (Feet): 75 Feet Assistive device: Rolling walker (2 wheels) Gait Pattern/deviations: Step-through pattern, Decreased step length - right, Decreased step length - left, Decreased stride length Gait velocity: reduced Gait velocity interpretation: <1.31 ft/sec, indicative of household ambulator   General Gait Details: Pt takes slow, small steps. No LOB, CGA for safety  Stairs            Wheelchair Mobility     Tilt Bed    Modified Rankin (Stroke Patients Only)       Balance Overall balance assessment: Needs assistance Sitting-balance support: No upper extremity supported, Feet supported Sitting balance-Leahy Scale: Fair     Standing balance support: Bilateral upper extremity supported, During functional activity, Reliant on assistive device for balance Standing balance-Leahy Scale: Poor Standing balance comment: Reliant on RW                             Pertinent Vitals/Pain Pain Assessment Pain Assessment: Faces Faces Pain Scale: Hurts little more Pain Location: "all over" Pain Descriptors / Indicators: Discomfort, Aching, Grimacing, Guarding Pain Intervention(s): Limited activity within patient's tolerance, Monitored during session, Repositioned    Home Living Family/patient expects to be discharged to:: Private residence Living Arrangements: Children (son and daughter-in-law) Available Help at Discharge: Family;Available 24 hours/day Type  of Home: House Home Access: Stairs to enter Entrance Stairs-Rails: None Entrance Stairs-Number of Steps: 1   Home Layout: Able to live on main level with bedroom/bathroom;Laundry or work area in [employer] Equipment: Gilmer Mor - single point;Wheelchair - manual;Grab bars - tub/shower;Shower  seat;BSC/3in1;Rollator (4 wheels)      Prior Function Prior Level of Function : Independent/Modified Independent;Driving             Mobility Comments: Utilizes rollator       Extremity/Trunk Assessment   Upper Extremity Assessment Upper Extremity Assessment: Generalized weakness    Lower Extremity Assessment Lower Extremity Assessment: Generalized weakness    Cervical / Trunk Assessment Cervical / Trunk Assessment: Normal  Communication   Communication Communication: No apparent difficulties  Cognition Arousal: Alert Behavior During Therapy: WFL for tasks assessed/performed Overall Cognitive Status: Impaired/Different from baseline                                 General Comments: Family reports pt is not quite herself due to pain meds today, otherwise appeared to be close to likely baseline        General Comments General comments (skin integrity, edema, etc.): educated pt and family on pt's fall risk and recs to use rollator with all standing mobility and receive assistance/guarding with stairs and transfers into/out of shower for her safety, they verbalized understanding; provided them with a gait belt    Exercises     Assessment/Plan    PT Assessment Patient needs continued PT services  PT Problem List Decreased strength;Decreased activity tolerance;Decreased balance;Decreased mobility       PT Treatment Interventions DME instruction;Gait training;Stair training;Functional mobility training;Therapeutic exercise;Therapeutic activities;Balance training;Neuromuscular re-education;Patient/family education    PT Goals (Current goals can be found in the Care Plan section)  Acute Rehab PT Goals Patient Stated Goal: to reduce pain PT Goal Formulation: With patient/family Time For Goal Achievement: 05/16/23 Potential to Achieve Goals: Good    Frequency Min 1X/week     Co-evaluation               AM-PAC PT "6 Clicks" Mobility  Outcome  Measure Help needed turning from your back to your side while in a flat bed without using bedrails?: A Little Help needed moving from lying on your back to sitting on the side of a flat bed without using bedrails?: A Little Help needed moving to and from a bed to a chair (including a wheelchair)?: A Little Help needed standing up from a chair using your arms (e.g., wheelchair or bedside chair)?: A Little Help needed to walk in hospital room?: A Little Help needed climbing 3-5 steps with a railing? : A Little 6 Click Score: 18    End of Session Equipment Utilized During Treatment: Gait belt Activity Tolerance: Patient tolerated treatment well Patient left: in chair;with call bell/phone within reach;with chair alarm set Nurse Communication: Mobility status PT Visit Diagnosis: Unsteadiness on feet (R26.81);Other abnormalities of gait and mobility (R26.89);Muscle weakness (generalized) (M62.81);Difficulty in walking, not elsewhere classified (R26.2)    Time: 7253-6644 PT Time Calculation (min) (ACUTE ONLY): 19 min   Charges:   PT Evaluation $PT Eval Moderate Complexity: 1 Mod   PT General Charges $$ ACUTE PT VISIT: 1 Visit         Virgil Benedict, PT, DPT Acute Rehabilitation Services  Office: [phone]   Bettina Gavia 05/02/2023, 1:20 PM

## 2023-05-03 NOTE — Discharge Summary (Signed)
Discharge Summary  Patient ID: Felicia Williams [id number] 82 y.o. 16-Mar-1941  Admit date: 04/30/2023  Discharge date and time: 05/02/2023  2:54 PM   Admitting Physician: Clydie Braun, MD   Discharge Physician: Dr. Karin Lieu  Admission Diagnoses: Complication of AV dialysis fistula [T82.9XXA]  Discharge Diagnoses:  same  Admission Condition: fair  Discharged Condition: fair  Indication for Admission: post op care, antibiotics, pleural effusion  Hospital Course: Ms. Akiah Hartigan is an 82 year old female who was brought to the operating room and underwent left arm AV graft revision with interposition bypass using Artegraft by Dr. Karin Lieu on 04/30/2023.  This was performed due to ulcerated area overlying AV graft.  She was admitted to the hospital postoperatively due to known pleural effusion.  Hospital medicine was consulted for medical management during hospitalization.  Interventional radiology performed a thoracentesis on postoperative day #1 and removed 800 mL of fluid from her left pleura.  Nephrology was also consulted for management of ESRD during hospitalization.  They were able to cannulate the graft above and below the surgical site.  Intraoperative cultures showed no growth after 48 hours however patient was prescribed doxycycline to take for 2 weeks due to intraoperative appearance of the disease section of the AV graft.  She will follow-up for staple removal in 2 to 3 weeks.  She was discharged home in stable condition.  Consults: nephrology, hospitalist medicine  Treatments: surgery: Revision of left arm AV graft with interposition Artegraft  Discharge Exam: See progress note 10/10 Vitals:   05/02/23 0449 05/02/23 0925  BP: (!) 157/55 (!) 131/42  Pulse: 64 67  Resp:  20  Temp: 97.7 F (36.5 C) 98.4 F (36.9 C)  SpO2: 97% 98%     Disposition: Discharge disposition: 01-Home or Self Care       Patient Instructions:  Allergies as of 05/02/2023        Reactions   Cefepime Other (See Comments)   Feb 2023 Encephalopathy with questionable seizures. Seems to be tolerating cefazolin , but the daughter mentioned not to start her on any cephalosporin    Morphine Other (See Comments)   "Dry heaving like crazy"   Sulfa Antibiotics Other (See Comments)   Shut pt's kidneys down        Medication List     TAKE these medications    carvedilol 12.5 MG tablet Commonly known as: COREG Take 1 tablet by mouth 2 (two) times daily.   doxycycline 100 MG tablet Commonly known as: VIBRA-TABS Take 1 tablet (100 mg total) by mouth 2 (two) times daily for 14 days.   hydrALAZINE 50 MG tablet Commonly known as: APRESOLINE Take 50 mg by mouth 2 (two) times daily. Do not take morning dose on Dialysis days. Monday,Wednesday and Friday   nitroGLYCERIN 0.4 MG SL tablet Commonly known as: NITROSTAT Place 0.4 mg under the tongue every 5 (five) minutes as needed for chest pain.   oxyCODONE 5 MG immediate release tablet Commonly known as: Oxy IR/ROXICODONE Take 1 tablet (5 mg total) by mouth every 6 (six) hours as needed. What changed: when to take this   traZODone 100 MG tablet Commonly known as: DESYREL Take 100 mg by mouth at bedtime.       Activity: activity as tolerated Diet: regular diet Wound Care: keep wound clean and dry  Follow-up with [facility] in 2 weeks.  Signed: Emilie Rutter, PA-C 05/03/2023 1:01 PM [facility] Office: [phone]

## 2023-05-05 LAB — AEROBIC/ANAEROBIC CULTURE W GRAM STAIN (SURGICAL/DEEP WOUND)
Culture: NO GROWTH
Gram Stain: NONE SEEN

## 2023-05-06 LAB — CULTURE, BLOOD (ROUTINE X 2): Culture: NO GROWTH

## 2023-05-07 LAB — CULTURE, BLOOD (ROUTINE X 2): Culture: NO GROWTH

## 2023-05-09 ENCOUNTER — Telehealth: Payer: Self-pay

## 2023-05-09 ENCOUNTER — Encounter (HOSPITAL_COMMUNITY): Payer: Self-pay | Admitting: Anesthesiology

## 2023-05-09 ENCOUNTER — Encounter (HOSPITAL_COMMUNITY): Admission: RE | Payer: Self-pay | Source: Home / Self Care

## 2023-05-09 ENCOUNTER — Other Ambulatory Visit: Payer: Self-pay

## 2023-05-09 ENCOUNTER — Ambulatory Visit (HOSPITAL_COMMUNITY): Admission: RE | Admit: 2023-05-09 | Payer: Medicare Other | Source: Home / Self Care | Admitting: Vascular Surgery

## 2023-05-09 DIAGNOSIS — N186 End stage renal disease: Secondary | ICD-10-CM

## 2023-05-09 DIAGNOSIS — T829XXS Unspecified complication of cardiac and vascular prosthetic device, implant and graft, sequela: Secondary | ICD-10-CM

## 2023-05-09 SURGERY — INSERTION OF DIALYSIS CATHETER
Anesthesia: Choice

## 2023-05-09 NOTE — Telephone Encounter (Signed)
Received a call from Jaspreet/Dr. Bhutani's office, stating patient is scheduled today at [facility] to have TDC inserted. Advised, we will cancel surgery we have scheduled at [facility]. Dr. Lenell Antu made aware.

## 2023-05-09 NOTE — Telephone Encounter (Signed)
Received a call from Johnn Hai, NP at [facility]. Pt is s/p left arm AVF revision with interposition bypass on 10/8. States her fistula was able to be cannulated successfully on Monday, but when she returned yesterday for treatment, the fistula site was red, edematous, and painful to touch. Patient was unable to receive treatment and already taking doxycycline x 2 weeks. She has a postop appt on 10/31. She is requesting patient to have a TDC placed on today, if possible, to promote rest of fistula and to resume dialysis treatment on tomorrow via catheter as scheduled. She reports patient has been NPO after midnight.   Spoke with Aggie Moats, PA-C, who advised OK to schedule TDC insertion with Dr. Lenell Antu.   Spoke with Aram Beecham at OR front desk, who scheduled patient for surgery and advised to have patient to proceed to hospital.   Spoke with patient to provide information given. Verified if patient has been NPO after midnight. Patient reports that she had breakfast at 0700 am. Advised patient to hold off on proceeding to hospital because I will have to verify with Dr. Lenell Antu, if OK to proceed with surgery at later time today.  Dr. Lenell Antu updated on situation and will contact back with further instruction. Patient made aware.

## 2023-05-13 ENCOUNTER — Encounter (HOSPITAL_COMMUNITY): Payer: Self-pay

## 2023-05-13 ENCOUNTER — Emergency Department (HOSPITAL_COMMUNITY): Payer: Medicare Other

## 2023-05-13 ENCOUNTER — Inpatient Hospital Stay (HOSPITAL_COMMUNITY)
Admission: EM | Admit: 2023-05-13 | Discharge: 2023-05-23 | DRG: 393 | Disposition: A | Discharge status: Home Health | Payer: Medicare Other | Attending: Internal Medicine | Admitting: Internal Medicine

## 2023-05-13 ENCOUNTER — Other Ambulatory Visit: Payer: Self-pay

## 2023-05-13 DIAGNOSIS — I6389 Other cerebral infarction: Secondary | ICD-10-CM | POA: Diagnosis not present

## 2023-05-13 DIAGNOSIS — R1314 Dysphagia, pharyngoesophageal phase: Secondary | ICD-10-CM | POA: Diagnosis present

## 2023-05-13 DIAGNOSIS — I5020 Unspecified systolic (congestive) heart failure: Principal | ICD-10-CM

## 2023-05-13 DIAGNOSIS — D631 Anemia in chronic kidney disease: Secondary | ICD-10-CM | POA: Diagnosis present

## 2023-05-13 DIAGNOSIS — Z91199 Patient's noncompliance with other medical treatment and regimen due to unspecified reason: Secondary | ICD-10-CM

## 2023-05-13 DIAGNOSIS — G9340 Encephalopathy, unspecified: Secondary | ICD-10-CM | POA: Diagnosis not present

## 2023-05-13 DIAGNOSIS — Z681 Body mass index (BMI) 19 or less, adult: Secondary | ICD-10-CM

## 2023-05-13 DIAGNOSIS — R1013 Epigastric pain: Secondary | ICD-10-CM | POA: Diagnosis not present

## 2023-05-13 DIAGNOSIS — Z8249 Family history of ischemic heart disease and other diseases of the circulatory system: Secondary | ICD-10-CM

## 2023-05-13 DIAGNOSIS — K862 Cyst of pancreas: Secondary | ICD-10-CM | POA: Diagnosis present

## 2023-05-13 DIAGNOSIS — Z882 Allergy status to sulfonamides status: Secondary | ICD-10-CM

## 2023-05-13 DIAGNOSIS — Z8269 Family history of other diseases of the musculoskeletal system and connective tissue: Secondary | ICD-10-CM

## 2023-05-13 DIAGNOSIS — I38 Endocarditis, valve unspecified: Secondary | ICD-10-CM | POA: Diagnosis not present

## 2023-05-13 DIAGNOSIS — N2581 Secondary hyperparathyroidism of renal origin: Secondary | ICD-10-CM | POA: Diagnosis present

## 2023-05-13 DIAGNOSIS — K449 Diaphragmatic hernia without obstruction or gangrene: Secondary | ICD-10-CM | POA: Diagnosis not present

## 2023-05-13 DIAGNOSIS — I5042 Chronic combined systolic (congestive) and diastolic (congestive) heart failure: Secondary | ICD-10-CM | POA: Diagnosis not present

## 2023-05-13 DIAGNOSIS — M898X9 Other specified disorders of bone, unspecified site: Secondary | ICD-10-CM | POA: Diagnosis present

## 2023-05-13 DIAGNOSIS — H7092 Unspecified mastoiditis, left ear: Secondary | ICD-10-CM | POA: Diagnosis present

## 2023-05-13 DIAGNOSIS — Z515 Encounter for palliative care: Secondary | ICD-10-CM | POA: Diagnosis not present

## 2023-05-13 DIAGNOSIS — Z79899 Other long term (current) drug therapy: Secondary | ICD-10-CM

## 2023-05-13 DIAGNOSIS — T827XXA Infection and inflammatory reaction due to other cardiac and vascular devices, implants and grafts, initial encounter: Secondary | ICD-10-CM | POA: Diagnosis present

## 2023-05-13 DIAGNOSIS — Z66 Do not resuscitate: Secondary | ICD-10-CM | POA: Diagnosis present

## 2023-05-13 DIAGNOSIS — Z992 Dependence on renal dialysis: Secondary | ICD-10-CM

## 2023-05-13 DIAGNOSIS — J918 Pleural effusion in other conditions classified elsewhere: Secondary | ICD-10-CM | POA: Diagnosis present

## 2023-05-13 DIAGNOSIS — Z885 Allergy status to narcotic agent status: Secondary | ICD-10-CM

## 2023-05-13 DIAGNOSIS — G9341 Metabolic encephalopathy: Secondary | ICD-10-CM | POA: Diagnosis present

## 2023-05-13 DIAGNOSIS — J189 Pneumonia, unspecified organism: Secondary | ICD-10-CM | POA: Diagnosis not present

## 2023-05-13 DIAGNOSIS — J90 Pleural effusion, not elsewhere classified: Secondary | ICD-10-CM | POA: Diagnosis not present

## 2023-05-13 DIAGNOSIS — R1310 Dysphagia, unspecified: Secondary | ICD-10-CM | POA: Diagnosis not present

## 2023-05-13 DIAGNOSIS — R197 Diarrhea, unspecified: Secondary | ICD-10-CM | POA: Diagnosis not present

## 2023-05-13 DIAGNOSIS — K551 Chronic vascular disorders of intestine: Principal | ICD-10-CM | POA: Diagnosis present

## 2023-05-13 DIAGNOSIS — J9611 Chronic respiratory failure with hypoxia: Secondary | ICD-10-CM | POA: Diagnosis present

## 2023-05-13 DIAGNOSIS — R634 Abnormal weight loss: Secondary | ICD-10-CM | POA: Diagnosis present

## 2023-05-13 DIAGNOSIS — R54 Age-related physical debility: Secondary | ICD-10-CM | POA: Diagnosis present

## 2023-05-13 DIAGNOSIS — I774 Celiac artery compression syndrome: Secondary | ICD-10-CM | POA: Diagnosis not present

## 2023-05-13 DIAGNOSIS — Z7189 Other specified counseling: Secondary | ICD-10-CM | POA: Diagnosis not present

## 2023-05-13 DIAGNOSIS — K222 Esophageal obstruction: Secondary | ICD-10-CM | POA: Diagnosis not present

## 2023-05-13 DIAGNOSIS — Z9981 Dependence on supplemental oxygen: Secondary | ICD-10-CM

## 2023-05-13 DIAGNOSIS — K3184 Gastroparesis: Secondary | ICD-10-CM | POA: Diagnosis present

## 2023-05-13 DIAGNOSIS — L89152 Pressure ulcer of sacral region, stage 2: Secondary | ICD-10-CM | POA: Diagnosis present

## 2023-05-13 DIAGNOSIS — R7989 Other specified abnormal findings of blood chemistry: Secondary | ICD-10-CM

## 2023-05-13 DIAGNOSIS — M109 Gout, unspecified: Secondary | ICD-10-CM | POA: Diagnosis present

## 2023-05-13 DIAGNOSIS — R112 Nausea with vomiting, unspecified: Secondary | ICD-10-CM

## 2023-05-13 DIAGNOSIS — R1010 Upper abdominal pain, unspecified: Secondary | ICD-10-CM

## 2023-05-13 DIAGNOSIS — N186 End stage renal disease: Secondary | ICD-10-CM | POA: Diagnosis present

## 2023-05-13 DIAGNOSIS — F1120 Opioid dependence, uncomplicated: Secondary | ICD-10-CM | POA: Diagnosis present

## 2023-05-13 DIAGNOSIS — F419 Anxiety disorder, unspecified: Secondary | ICD-10-CM | POA: Diagnosis present

## 2023-05-13 DIAGNOSIS — I132 Hypertensive heart and chronic kidney disease with heart failure and with stage 5 chronic kidney disease, or end stage renal disease: Secondary | ICD-10-CM | POA: Diagnosis present

## 2023-05-13 DIAGNOSIS — K219 Gastro-esophageal reflux disease without esophagitis: Secondary | ICD-10-CM | POA: Diagnosis present

## 2023-05-13 DIAGNOSIS — I953 Hypotension of hemodialysis: Secondary | ICD-10-CM | POA: Diagnosis not present

## 2023-05-13 DIAGNOSIS — H7492 Unspecified disorder of left middle ear and mastoid: Secondary | ICD-10-CM | POA: Diagnosis not present

## 2023-05-13 DIAGNOSIS — F32A Depression, unspecified: Secondary | ICD-10-CM | POA: Diagnosis present

## 2023-05-13 DIAGNOSIS — J181 Lobar pneumonia, unspecified organism: Secondary | ICD-10-CM | POA: Diagnosis not present

## 2023-05-13 DIAGNOSIS — G8929 Other chronic pain: Secondary | ICD-10-CM | POA: Diagnosis present

## 2023-05-13 DIAGNOSIS — R1084 Generalized abdominal pain: Secondary | ICD-10-CM | POA: Diagnosis not present

## 2023-05-13 DIAGNOSIS — I10 Essential (primary) hypertension: Secondary | ICD-10-CM | POA: Diagnosis not present

## 2023-05-13 DIAGNOSIS — R109 Unspecified abdominal pain: Secondary | ICD-10-CM | POA: Diagnosis not present

## 2023-05-13 DIAGNOSIS — Z833 Family history of diabetes mellitus: Secondary | ICD-10-CM

## 2023-05-13 DIAGNOSIS — L03115 Cellulitis of right lower limb: Secondary | ICD-10-CM | POA: Diagnosis present

## 2023-05-13 DIAGNOSIS — H7093 Unspecified mastoiditis, bilateral: Secondary | ICD-10-CM | POA: Diagnosis not present

## 2023-05-13 DIAGNOSIS — H7090 Unspecified mastoiditis, unspecified ear: Secondary | ICD-10-CM

## 2023-05-13 DIAGNOSIS — I5043 Acute on chronic combined systolic (congestive) and diastolic (congestive) heart failure: Secondary | ICD-10-CM | POA: Diagnosis present

## 2023-05-13 DIAGNOSIS — Y832 Surgical operation with anastomosis, bypass or graft as the cause of abnormal reaction of the patient, or of later complication, without mention of misadventure at the time of the procedure: Secondary | ICD-10-CM | POA: Diagnosis present

## 2023-05-13 DIAGNOSIS — J323 Chronic sphenoidal sinusitis: Secondary | ICD-10-CM | POA: Diagnosis not present

## 2023-05-13 DIAGNOSIS — R1319 Other dysphagia: Secondary | ICD-10-CM

## 2023-05-13 DIAGNOSIS — K2970 Gastritis, unspecified, without bleeding: Secondary | ICD-10-CM | POA: Diagnosis not present

## 2023-05-13 DIAGNOSIS — E162 Hypoglycemia, unspecified: Secondary | ICD-10-CM | POA: Diagnosis not present

## 2023-05-13 DIAGNOSIS — Z881 Allergy status to other antibiotic agents status: Secondary | ICD-10-CM

## 2023-05-13 DIAGNOSIS — E875 Hyperkalemia: Secondary | ICD-10-CM | POA: Diagnosis not present

## 2023-05-13 DIAGNOSIS — M549 Dorsalgia, unspecified: Secondary | ICD-10-CM | POA: Diagnosis present

## 2023-05-13 DIAGNOSIS — E782 Mixed hyperlipidemia: Secondary | ICD-10-CM | POA: Diagnosis present

## 2023-05-13 LAB — CBC
HCT: 40.7 % (ref 36.0–46.0)
Hemoglobin: 12.9 g/dL (ref 12.0–15.0)
MCH: 27.2 pg (ref 26.0–34.0)
MCHC: 31.7 g/dL (ref 30.0–36.0)
MCV: 85.9 fL (ref 80.0–100.0)
Platelets: 209 10*3/uL (ref 150–400)
RBC: 4.74 MIL/uL (ref 3.87–5.11)
RDW: 18.3 % — ABNORMAL HIGH (ref 11.5–15.5)
WBC: 6 10*3/uL (ref 4.0–10.5)
nRBC: 0 % (ref 0.0–0.2)

## 2023-05-13 LAB — BASIC METABOLIC PANEL
Anion gap: 18 — ABNORMAL HIGH (ref 5–15)
BUN: 116 mg/dL — ABNORMAL HIGH (ref 8–23)
CO2: 23 mmol/L (ref 22–32)
Calcium: 8.2 mg/dL — ABNORMAL LOW (ref 8.9–10.3)
Chloride: 98 mmol/L (ref 98–111)
Creatinine, Ser: 9.25 mg/dL — ABNORMAL HIGH (ref 0.44–1.00)
GFR, Estimated: 4 mL/min — ABNORMAL LOW (ref >60)
Glucose, Bld: 109 mg/dL — ABNORMAL HIGH (ref 70–99)
Potassium: 5.3 mmol/L — ABNORMAL HIGH (ref 3.5–5.1)
Sodium: 139 mmol/L (ref 135–145)

## 2023-05-13 LAB — BRAIN NATRIURETIC PEPTIDE: B Natriuretic Peptide: 3266 pg/mL — ABNORMAL HIGH (ref 0.0–100.0)

## 2023-05-13 LAB — TROPONIN I (HIGH SENSITIVITY)
Troponin I (High Sensitivity): 158 ng/L (ref <18)
Troponin I (High Sensitivity): 144 ng/L (ref <18)

## 2023-05-13 MED ORDER — HYDROMORPHONE HCL 1 MG/ML IJ SOLN: 0.5000 mg | Freq: Once | INTRAMUSCULAR | Status: DC

## 2023-05-13 MED ORDER — HYDROMORPHONE HCL 1 MG/ML IJ SOLN
0.2500 mg | Freq: Once | INTRAMUSCULAR | Status: AC
  Administered 2023-05-13: 0.25 mg via INTRAVENOUS
  Filled 2023-05-13: qty 0.5

## 2023-05-13 MED ORDER — SODIUM ZIRCONIUM CYCLOSILICATE 5 G PO PACK
5.0000 g | PACK | Freq: Once | ORAL | Status: DC
  Filled 2023-05-13: qty 1

## 2023-05-13 MED ORDER — METOCLOPRAMIDE HCL 5 MG/ML IJ SOLN
10.0000 mg | Freq: Once | INTRAMUSCULAR | Status: AC
  Administered 2023-05-13: 10 mg via INTRAVENOUS
  Filled 2023-05-13: qty 2

## 2023-05-13 MED ORDER — PANTOPRAZOLE SODIUM 40 MG IV SOLR
40.0000 mg | Freq: Once | INTRAVENOUS | Status: AC
  Administered 2023-05-13: 40 mg via INTRAVENOUS
  Filled 2023-05-13: qty 10

## 2023-05-13 NOTE — H&P (Incomplete)
History and Physical    Patient: Felicia Williams VHQ:[id number] DOB: [birth year] DOA: 05/13/2023 DOS: the patient was seen and examined on 05/13/2023 PCP: Felicia Chesterfield, NP  Patient coming from: {Point_of_Origin:[address]}  Chief Complaint:  Chief Complaint  Patient presents with   Emesis   HPI: Felicia Williams is a 82 y.o. 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 Karilyn Cota.   GERD (gastroesophageal reflux disease)    Glomerulonephritis    Gout    Mixed hyperlipidemia    Spondylosis    Past Surgical History:  Procedure Laterality Date   APPENDECTOMY     AV FISTULA PLACEMENT Left 03/20/2022   Procedure: INSERTION OF LEFT UPPER ARM ARTERIOVENOUS (AV) GORE-TEX GRAFT;  Surgeon: Larina Earthly, MD;  Location: [facility];  Service: Vascular;  Laterality: Left;   CATARACT EXTRACTION Bilateral    COLONOSCOPY N/A 12/28/2015   Procedure: COLONOSCOPY;  Surgeon: Malissa Hippo, MD;  Location: [facility];  Service: Endoscopy;  Laterality: N/A;  815   ESOPHAGOGASTRODUODENOSCOPY N/A 11/16/2020   Procedure: ESOPHAGOGASTRODUODENOSCOPY (EGD);  Surgeon: Malissa Hippo, MD;  Location: [facility];  Service: Endoscopy;  Laterality: N/A;  1:15   EXCHANGE OF A DIALYSIS CATHETER N/A 03/20/2022   Procedure: EXCHANGE OF A DIALYSIS CATHETER;  Surgeon: Larina Earthly, MD;  Location: [facility];  Service: Vascular;  Laterality: N/A;   INSERTION OF DIALYSIS CATHETER Right 09/30/2021   Procedure: INSERTION OF TUNNELED DIALYSIS CATHETER;  Surgeon: Maeola Harman, MD;  Location[facility] OR;  Service: Vascular;   Laterality: Right;   IR FLUORO GUIDE CV LINE RIGHT  01/16/2019   IR REMOVAL TUN CV CATH W/O FL  07/01/2019   IR THORACENTESIS ASP PLEURAL SPACE W/IMG GUIDE  05/01/2023   IR THROMBECTOMY AV FISTULA W/THROMBOLYSIS/PTA INC/SHUNT/IMG LEFT Left 02/01/2023   IR US GUIDE VASC ACCESS LEFT  02/01/2023   IR US GUIDE VASC ACCESS RIGHT  01/16/2019   POLYPECTOMY  11/16/2020   Procedure: POLYPECTOMY;  Surgeon: Malissa Hippo, MD;  [facility];  Service: Endoscopy;;  gastric   REMOVAL OF A DIALYSIS CATHETER  09/30/2021   Procedure: REMOVAL OF A PERITONEAL DIALYSIS CATHETER;  Surgeon: Maeola Harman, MD;  Location: [facility] OR;  Service: Vascular;;   REMOVAL OF A DIALYSIS CATHETER N/A 05/22/2022   Procedure: MINOR REMOVAL OF A TUNNELED DIALYSIS CATHETER;  Surgeon: Larina Earthly, MD;  Location: [facility];  Service: Vascular;  Laterality: N/A;   REVISON OF ARTERIOVENOUS FISTULA Left 04/30/2023   Procedure: LEFT ARM FISTULA REVISION, INTERPOSITIONAL ARTEGRAFT;  Surgeon: Victorino Sparrow, MD;  Location: [facility] OR;  Service: Vascular;  Laterality: Left;   TUBAL LIGATION     Social History:  reports that she has never smoked. She has never been exposed to tobacco smoke. She has never used smokeless tobacco. She reports that she does not drink alcohol and does not use drugs.  Allergies  Allergen Reactions   Cefepime Other (See Comments)    Feb 2023 Encephalopathy with questionable seizures. Seems to be tolerating  cefazolin , but the daughter mentioned not to start her on any cephalosporin     Morphine Other (See Comments)    "Dry heaving like crazy"   Sulfa Antibiotics Other (See Comments)    Shut pt's kidneys down    Family History  Problem Relation Age of Onset   CAD Father    Heart attack Father    Diabetes Mellitus II Father    Hypertension Father    Lupus Brother     Williams to Admission medications   Medication Sig Start Date End Date Taking? Authorizing Provider  carvedilol (COREG)  12.5 MG tablet Take 1 tablet by mouth 2 (two) times daily.    [provider]  doxycycline (VIBRA-TABS) 100 MG tablet Take 1 tablet (100 mg total) by mouth 2 (two) times daily for 14 days. 05/02/23 05/16/23  Emilie Rutter, PA-C  hydrALAZINE (APRESOLINE) 50 MG tablet Take 50 mg by mouth 2 (two) times daily. Do not take morning dose on Dialysis days. Monday,Wednesday and Friday    [provider]  nitroGLYCERIN (NITROSTAT) 0.4 MG SL tablet Place 0.4 mg under the tongue every 5 (five) minutes as needed for chest pain. 12/28/22   [provider]  oxyCODONE (OXY IR/ROXICODONE) 5 MG immediate release tablet Take 1 tablet (5 mg total) by mouth every 6 (six) hours as needed. 05/02/23   Emilie Rutter, PA-C  traZODone (DESYREL) 100 MG tablet Take 100 mg by mouth at bedtime.    [provider]    Physical Exam: Vitals:   05/13/23 1900 05/13/23 2000 05/13/23 2030 05/13/23 2045  BP: (!) 176/50 (!) 178/68 (!) 178/50 (!) 141/44  Pulse: 77 76 75 76  Resp: (!) 23 (!) 21 (!) 24 (!) 22  Temp:      TempSrc:      SpO2: 98% 98% 97% 96%  Weight:       *** Data Reviewed: {Tip this will not be part of the note when signed- Document your independent interpretation of telemetry tracing, EKG, lab, Radiology test or any other diagnostic tests. Add any new diagnostic test ordered today. (Optional):26781} {Results:26384}  Assessment and Plan: No notes have been filed under this hospital service. Service: Hospitalist     Advance Care Planning:   Code Status: Williams ***  Consults: ***  Family Communication: ***  Severity of Illness: {Observation/Inpatient:21159}  Author: Frankey Shown, DO 05/13/2023 10:29 PM  For on call review [email or web address].

## 2023-05-13 NOTE — ED Provider Notes (Signed)
[place] EMERGENCY DEPARTMENT AT [facility] Provider Note   CSN: [id number] Arrival date & time: 05/13/23  1255     History {Add pertinent medical, surgical, social history, OB history to HPI:1} Chief Complaint  Patient presents with   Emesis    Felicia Williams is a 82 y.o. female.  Patient has a history of congestive heart failure and end-stage renal disease.  She did not get dialysis today because she has been throwing up.  She also complains of epigastric pain.  Patient has a history of pancreatic cyst   Emesis      Home Medications Prior to Admission medications   Medication Sig Start Date End Date Taking? Authorizing Provider  carvedilol (COREG) 12.5 MG tablet Take 1 tablet by mouth 2 (two) times daily.    [provider]  doxycycline (VIBRA-TABS) 100 MG tablet Take 1 tablet (100 mg total) by mouth 2 (two) times daily for 14 days. 05/02/23 05/16/23  Emilie Rutter, PA-C  hydrALAZINE (APRESOLINE) 50 MG tablet Take 50 mg by mouth 2 (two) times daily. Do not take morning dose on Dialysis days. Monday,Wednesday and Friday    [provider]  nitroGLYCERIN (NITROSTAT) 0.4 MG SL tablet Place 0.4 mg under the tongue every 5 (five) minutes as needed for chest pain. 12/28/22   [provider]  oxyCODONE (OXY IR/ROXICODONE) 5 MG immediate release tablet Take 1 tablet (5 mg total) by mouth every 6 (six) hours as needed. 05/02/23   Emilie Rutter, PA-C  traZODone (DESYREL) 100 MG tablet Take 100 mg by mouth at bedtime.    [provider]      Allergies    Cefepime, Morphine, and Sulfa antibiotics    Review of Systems   Review of Systems  Gastrointestinal:  Positive for vomiting.    Physical Exam Updated Vital Signs BP (!) 163/58   Pulse 74   Temp 98.1 F (36.7 C) (Oral)   Resp (!) 21   Wt 42.6 kg   SpO2 96%   BMI 18.99 kg/m  Physical Exam  ED Results / Procedures / Treatments   Labs (all labs ordered are listed,  but only abnormal results are displayed) Labs Reviewed  BASIC METABOLIC PANEL - Abnormal; Notable for the following components:      Result Value   Potassium 5.3 (*)    Glucose, Bld 109 (*)    BUN 116 (*)    Creatinine, Ser 9.25 (*)    Calcium 8.2 (*)    GFR, Estimated 4 (*)    Anion gap 18 (*)    All other components within normal limits  CBC - Abnormal; Notable for the following components:   RDW 18.3 (*)    All other components within normal limits  BRAIN NATRIURETIC PEPTIDE - Abnormal; Notable for the following components:   B Natriuretic Peptide 3,266.0 (*)    All other components within normal limits  TROPONIN I (HIGH SENSITIVITY) - Abnormal; Notable for the following components:   Troponin I (High Sensitivity) 158 (*)    All other components within normal limits  TROPONIN I (HIGH SENSITIVITY) - Abnormal; Notable for the following components:   Troponin I (High Sensitivity) 144 (*)    All other components within normal limits    EKG EKG Interpretation Date/Time:  Monday May 13 2023 13:09:32 EDT Ventricular Rate:  75 PR Interval:  142 QRS Duration:  90 QT Interval:  418 QTC Calculation: 466 R Axis:   -47  Text Interpretation: Normal sinus  rhythm Left anterior fascicular block  no significant change since July 2024 Confirmed by Pricilla Loveless [phone]) on 05/13/2023 2:26:34 PM  Radiology CT ABDOMEN PELVIS WO CONTRAST  Result Date: 05/13/2023 CLINICAL DATA:  Acute abdominal pain with nausea and vomiting for several days EXAM: CT ABDOMEN AND PELVIS WITHOUT CONTRAST TECHNIQUE: Multidetector CT imaging of the abdomen and pelvis was performed following the standard protocol without IV contrast. RADIATION DOSE REDUCTION: This exam was performed according to the departmental dose-optimization program which includes automated exposure control, adjustment of the mA and/or kV according to patient size and/or use of iterative reconstruction technique. COMPARISON:  09/15/2022  FINDINGS: Lower chest: Moderate to large pleural effusions are noted bilaterally with associated lower lobe consolidation increased in the interval from the prior exam. Hepatobiliary: Liver is within normal limits. Gallbladder is well distended with a single dependent gallstone. Pancreas: Stable cystic lesion is noted within the pancreatic body unchanged from the recent exam. Follow-up as previously described. Spleen: Normal in size without focal abnormality. Adrenals/Urinary Tract: Adrenal glands are within normal limits. Kidneys are well visualized bilaterally with diffuse renal vascular calcifications. No definitive calculi are seen. No obstructive changes are noted. Bladder is decompressed. Stomach/Bowel: No obstructive or inflammatory changes of the colon are noted. The appendix is not well visualized consistent with prior surgical history. Small bowel and stomach are within normal limits. Vascular/Lymphatic: Aortic atherosclerosis. No enlarged abdominal or pelvic lymph nodes. Reproductive: Uterus and bilateral adnexa are unremarkable. Other: No abdominal wall hernia or abnormality. No abdominopelvic ascites. Musculoskeletal: Degenerative changes are noted without acute abnormality. IMPRESSION: Increased bilateral pleural effusions with lower lobe consolidation when compare with the prior exam. Chronic changes as described. Electronically Signed   By: Alcide Clever M.D.   On: 05/13/2023 21:35   DG Chest 2 View  Result Date: 05/13/2023 CLINICAL DATA:  Chest pain. EXAM: CHEST - 2 VIEW COMPARISON:  05/01/2023. FINDINGS: Mild-to-moderate pulmonary vascular congestion noted. There are bilateral small-to-moderate pleural effusions, increased since the prior study. Bilateral lung fields are otherwise clear. Evaluation of cardiomediastinal silhouette is nondiagnostic due to bilateral lower hemithorax opacification. No acute osseous abnormalities. The soft tissues are within normal limits. Right IJ hemodialysis  catheter noted with its tip overlying the cavoatrial junction region. IMPRESSION: *Mild-to-moderate pulmonary vascular congestion. Small to moderate bilateral pleural effusions, increased since the prior study. Electronically Signed   By: Jules Schick M.D.   On: 05/13/2023 16:20    Procedures Procedures  {Document cardiac monitor, telemetry assessment procedure when appropriate:1}  Medications Ordered in ED Medications  sodium zirconium cyclosilicate (LOKELMA) packet 5 g (5 g Oral Not Given 05/13/23 2224)  pantoprazole (PROTONIX) injection 40 mg (40 mg Intravenous Given 05/13/23 1741)  metoCLOPramide (REGLAN) injection 10 mg (10 mg Intravenous Given 05/13/23 1742)  HYDROmorphone (DILAUDID) injection 0.25 mg (0.25 mg Intravenous Given 05/13/23 1804)    ED Course/ Medical Decision Making/ A&P   {I spoke with nephrology and they recommended [facility] and they will see the patient tomorrow and most likely dialyzer Click here for ABCD2, HEART and other calculatorsREFRESH Note before signing :1}                              Medical Decision Making Amount and/or Complexity of Data Reviewed Labs: ordered. Radiology: ordered.  Risk Prescription drug management. Decision regarding hospitalization.   Patient with congestive heart failure and epigastric pain with renal failure.  Patient will be admitted to medicine with nephrology  consulting for possible dialysis  {Document critical care time when appropriate:1} {Document review of labs and clinical decision tools ie heart score, Chads2Vasc2 etc:1}  {Document your independent review of radiology images, and any outside records:1} {Document your discussion with family members, caretakers, and with consultants:1} {Document social determinants of health affecting pt's care:1} {Document your decision making why or why not admission, treatments were needed:1} Final Clinical Impression(s) / ED Diagnoses Final diagnoses:  Systolic congestive  heart failure, unspecified HF chronicity (HCC)  Pain of upper abdomen    Rx / DC Orders ED Discharge Orders     None

## 2023-05-13 NOTE — ED Notes (Signed)
Pt family member given a recliner and warm blanket for comfort.

## 2023-05-13 NOTE — ED Triage Notes (Addendum)
C/o upper abd pain radiating into chest with  n/v/d x3 days.  Pt reports was suppose to have EGD 1 month ago and was unable to do it.  Hx chf and dialysis on MWF but missed today due to n/v.  Baseline 2lpm [place]

## 2023-05-13 NOTE — H&P (Signed)
History and Physical    Patient: Felicia Williams WUJ:[id number] DOB: 1941/01/27 DOA: 05/13/2023 DOS: the patient was seen and examined on 05/14/2023 PCP: Rebekah Chesterfield, NP  Patient coming from: Home  Chief Complaint:  Chief Complaint  Patient presents with   Emesis   HPI: [name] is an 82 y.o. 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  Abdominal pain was sharp, it was initially intermittent, then it progressed to being constant and was rated as 8-9/10 on pain scale.  She was unable to go to dialysis yesterday due to continuous nausea and vomiting.  She endorsed decreased appetite, last vomitus was yesterday in the morning (10/21).  ED Course:  In the emergency department, BP was 183/65, other vital signs were within normal range.  Workup in the ED showed normal CBC, BMP was positive for potassium of 5.3, blood glucose 109, BUN/creatinine 116/9.25 BNP 3,266 (this was greater than 4500 in June 2024), troponin 158 > 144 (this was 594 about 4 months ago). CT abdomen pelvis without contrast showed increased bilateral  pleural effusions with lower lobe consolidation when compared with the prior exam. Protonix was given, Reglan was given.  Lokelma due to hyperkalemia was also given. Hospitalist was asked to admit patient for further evaluation and management.  Review of Systems: Review of systems as noted in the HPI. All other systems reviewed and are negative.   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 Karilyn Cota.   GERD (gastroesophageal reflux disease)    Glomerulonephritis    Gout    Mixed hyperlipidemia    Spondylosis    Past Surgical History:  Procedure Laterality Date   APPENDECTOMY     AV FISTULA PLACEMENT Left 03/20/2022   Procedure: INSERTION OF LEFT UPPER ARM ARTERIOVENOUS (AV) GORE-TEX GRAFT;  Surgeon: Larina Earthly, MD;  Location: [facility];  Service: Vascular;  Laterality: Left;   CATARACT EXTRACTION Bilateral    COLONOSCOPY N/A 12/28/2015   Procedure: COLONOSCOPY;  Surgeon: Malissa Hippo, MD;  Location: [facility];  Service: Endoscopy;  Laterality: N/A;  815   ESOPHAGOGASTRODUODENOSCOPY N/A 11/16/2020   Procedure: ESOPHAGOGASTRODUODENOSCOPY (EGD);  Surgeon: Malissa Hippo, MD;  Location: [facility];  Service: Endoscopy;  Laterality: N/A;  1:15   EXCHANGE OF A DIALYSIS CATHETER N/A 03/20/2022   Procedure: EXCHANGE OF A DIALYSIS CATHETER;  Surgeon: Larina Earthly, MD;  Location: [facility];  Service: Vascular;  Laterality: N/A;   INSERTION OF DIALYSIS CATHETER Right 09/30/2021   Procedure: INSERTION OF TUNNELED DIALYSIS CATHETER;  Surgeon: Maeola Harman, MD;  Location: [facility] OR;  Service: Vascular;  Laterality: Right;   IR FLUORO GUIDE CV LINE RIGHT  01/16/2019   IR REMOVAL TUN CV CATH W/O FL  07/01/2019   IR THORACENTESIS ASP PLEURAL SPACE W/IMG GUIDE  05/01/2023   IR THROMBECTOMY AV FISTULA W/THROMBOLYSIS/PTA INC/SHUNT/IMG LEFT Left 02/01/2023   IR US GUIDE VASC  ACCESS LEFT  02/01/2023   IR US GUIDE VASC ACCESS RIGHT  01/16/2019   POLYPECTOMY  11/16/2020   Procedure: POLYPECTOMY;  Surgeon: Malissa Hippo, MD;  Location: [facility];  Service: Endoscopy;;  gastric   REMOVAL OF A DIALYSIS CATHETER  09/30/2021   Procedure: REMOVAL OF A PERITONEAL DIALYSIS CATHETER;  Surgeon: Maeola Harman, MD;  Location: [facility] OR;  Service: Vascular;;   REMOVAL OF A DIALYSIS CATHETER N/A 05/22/2022    Procedure: MINOR REMOVAL OF A TUNNELED DIALYSIS CATHETER;  Surgeon: Larina Earthly, MD;  Location: [facility];  Service: Vascular;  Laterality: N/A;   REVISON OF ARTERIOVENOUS FISTULA Left 04/30/2023   Procedure: LEFT ARM FISTULA REVISION, INTERPOSITIONAL ARTEGRAFT;  Surgeon: Victorino Sparrow, MD;  Location: [facility] OR;  Service: Vascular;  Laterality: Left;   TUBAL LIGATION      Social History:  reports that she has never smoked. She has never been exposed to tobacco smoke. She has never used smokeless tobacco. She reports that she does not drink alcohol and does not use drugs.   Allergies  Allergen Reactions   Cefepime Other (See Comments)    Feb 2023 Encephalopathy with questionable seizures. Seems to be tolerating cefazolin , but the daughter mentioned not to start her on any cephalosporin     Morphine Other (See Comments)    "Dry heaving like crazy"   Sulfa Antibiotics Other (See Comments)    Shut pt's kidneys down    Family History  Problem Relation Age of Onset   CAD Father    Heart attack Father    Diabetes Mellitus II Father    Hypertension Father    Lupus Brother      Prior to Admission medications   Medication Sig Start Date End Date Taking? Authorizing Provider  carvedilol (COREG) 12.5 MG tablet Take 1 tablet by mouth 2 (two) times daily.    [provider]  doxycycline (VIBRA-TABS) 100 MG tablet Take 1 tablet (100 mg total) by mouth 2 (two) times daily for 14 days. 05/02/23 05/16/23  Emilie Rutter, PA-C  hydrALAZINE (APRESOLINE) 50 MG tablet Take 50 mg by mouth 2 (two) times daily. Do not take morning dose on Dialysis days. Monday,Wednesday and Friday    [provider]  nitroGLYCERIN (NITROSTAT) 0.4 MG SL tablet Place 0.4 mg under the tongue every 5 (five) minutes as needed for chest pain. 12/28/22   [provider]  oxyCODONE (OXY IR/ROXICODONE) 5 MG immediate release tablet Take 1 tablet (5 mg total) by mouth every 6 (six) hours as needed. 05/02/23    Emilie Rutter, PA-C  traZODone (DESYREL) 100 MG tablet Take 100 mg by mouth at bedtime.    [provider]    Physical Exam: BP (!) 176/61 (BP Location: Right Leg)   Pulse 80   Temp 98.6 F (37 C)   Resp 18   Wt 42.6 kg   SpO2 92%   BMI 18.99 kg/m   General: 82 y.o. year-old female ill appearing, but in no acute distress.  Alert and oriented x3. HEENT: NCAT, EOMI Neck: Supple, trachea medial Cardiovascular: Regular rate and rhythm with no rubs or gallops.  No thyromegaly or JVD noted.  No lower extremity edema. 2/4 pulses in all 4 extremities. Respiratory: Clear to auscultation with no wheezes or rales. Good inspiratory effort. Abdomen: Soft, tender to palpation of epigastric area without guarding.  Nondistended with normal bowel sounds x4 quadrants. Muskuloskeletal: Port-A-Cath was noted on right side of chest.  Noted left arm AV fistula status post repair.  No cyanosis, clubbing or edema noted bilaterally Neuro: CN II-XII intact, strength 5/5 x 4, sensation, reflexes intact Skin: No ulcerative lesions noted or rashes Psychiatry: Judgement and insight appear normal. Mood is appropriate for condition and setting          Labs on Admission:  Basic Metabolic Panel: Recent Labs  Lab 05/13/23 1325  NA 139  K 5.3*  CL 98  CO2 23  GLUCOSE 109*  BUN 116*  CREATININE 9.25*  CALCIUM 8.2*   Liver Function Tests: No results for input(s): "AST", "ALT", "ALKPHOS", "BILITOT", "PROT", "ALBUMIN" in the last 168 hours. No results for input(s): "LIPASE", "AMYLASE" in the last 168 hours. No results for input(s): "AMMONIA" in the last 168 hours. CBC: Recent Labs  Lab 05/13/23 1325  WBC 6.0  HGB 12.9  HCT 40.7  MCV 85.9  PLT 209   Cardiac Enzymes: No results for input(s): "CKTOTAL", "CKMB", "CKMBINDEX", "TROPONINI" in the last 168 hours.  BNP (last 3 results) Recent Labs    10/14/22 0751 01/06/23 0917 05/13/23 1325  BNP >4,500.0* >4,500.0* 3,266.0*     ProBNP (last 3 results) No results for input(s): "PROBNP" in the last 8760 hours.  CBG: No results for input(s): "GLUCAP" in the last 168 hours.  Radiological Exams on Admission: CT ABDOMEN PELVIS WO CONTRAST  Result Date: 05/13/2023 CLINICAL DATA:  Acute abdominal pain with nausea and vomiting for several days EXAM: CT ABDOMEN AND PELVIS WITHOUT CONTRAST TECHNIQUE: Multidetector CT imaging of the abdomen and pelvis was performed following the standard protocol without IV contrast. RADIATION DOSE REDUCTION: This exam was performed according to the departmental dose-optimization program which includes automated exposure control, adjustment of the mA and/or kV according to patient size and/or use of iterative reconstruction technique. COMPARISON:  09/15/2022 FINDINGS: Lower chest: Moderate to large pleural effusions are noted bilaterally with associated lower lobe consolidation increased in the interval from the prior exam. Hepatobiliary: Liver is within normal limits. Gallbladder is well distended with a single dependent gallstone. Pancreas: Stable cystic lesion is noted within the pancreatic body unchanged from the recent exam. Follow-up as previously described. Spleen: Normal in size without focal abnormality. Adrenals/Urinary Tract: Adrenal glands are within normal limits. Kidneys are well visualized bilaterally with diffuse renal vascular calcifications. No definitive calculi are seen. No obstructive changes are noted. Bladder is decompressed. Stomach/Bowel: No obstructive or inflammatory changes of the colon are noted. The appendix is not well visualized consistent with prior surgical history. Small bowel and stomach are within normal limits. Vascular/Lymphatic: Aortic atherosclerosis. No enlarged abdominal or pelvic lymph nodes. Reproductive: Uterus and bilateral adnexa are unremarkable. Other: No abdominal wall hernia or abnormality. No abdominopelvic ascites. Musculoskeletal: Degenerative  changes are noted without acute abnormality. IMPRESSION: Increased bilateral pleural effusions with lower lobe consolidation when compare with the prior exam. Chronic changes as described. Electronically Signed   By: Alcide Clever M.D.   On: 05/13/2023 21:35   DG Chest 2 View  Result Date: 05/13/2023 CLINICAL DATA:  Chest pain. EXAM: CHEST - 2 VIEW COMPARISON:  05/01/2023. FINDINGS: Mild-to-moderate pulmonary vascular congestion noted. There are bilateral small-to-moderate pleural effusions, increased since the prior study. Bilateral lung fields are otherwise clear. Evaluation of cardiomediastinal silhouette is nondiagnostic due to bilateral lower hemithorax opacification. No acute osseous abnormalities. The soft tissues are within normal limits. Right IJ hemodialysis catheter noted with its tip overlying the cavoatrial junction region. IMPRESSION: *Mild-to-moderate pulmonary vascular congestion. Small to moderate bilateral pleural  effusions, increased since the prior study. Electronically Signed   By: Jules Schick M.D.   On: 05/13/2023 16:20    EKG: I independently viewed the EKG done and my findings are as followed: Normal sinus rhythm at a rate of 75 bpm with LAFB  Assessment/Plan Present on Admission:  Chronic combined systolic and diastolic CHF (congestive heart failure) (HCC)  Essential hypertension, benign  Hyperkalemia  Elevated troponin  Principal Problem:   Abdominal pain Active Problems:   End-stage renal disease on hemodialysis (HCC)   Elevated troponin   Essential hypertension, benign   Hyperkalemia   Chronic combined systolic and diastolic CHF (congestive heart failure) (HCC)   Elevated brain natriuretic peptide (BNP) level   Nausea & vomiting  Abdominal pain, nausea and vomiting Patient will be admitted to [facility] Continue IV Dilaudid 0.5 mg q.4h p.r.n. for moderate to severe pain Continue IV Zofran p.r.n. Continue clear liquid diet with plan to advanced diet as  tolerated  Bilateral pleural effusion CT abdomen pelvis without contrast showed increased bilateral  pleural effusions with lower lobe consolidation Thoracentesis will be obtained  Chronic combined systolic and diastolic CHF Chronic elevated BNP Echocardiogram done in July 2024 showed LVEF of 35 to 40%, LV demonstrates global hypokinesis, G1 DD BNP 3,266 (this  was greater than 4500 in June 2024) Continue total input/output, daily weights and fluid restriction Continue heart healthy diet   Chronic elevated troponin Troponin 158 > 144 ( this was 594 about 4 months ago) Patient denies any chest pain Continue to monitor patient.  Hyperkalemia K+ 5.3, Lokelma was given Continue to monitor potassium level   ESRD on HD (MWF) Patient missed yesterday's dialysis session due to nausea and vomiting Nephrology consult placed.  Please reach out to nephrology for possible dialysis of patient today  Essential hypertension Continue hydralazine, Coreg  Chronic back pain Continue oxycodone/Dilaudid  Goals of care: Palliative care will be consulted    DVT prophylaxis: SCDs   Advance Care Planning: CODE STATUS: Full code  Consults: Nephrology, palliative care  Family Communication: Daughter at bedside  Severity of Illness: The appropriate patient status for this patient is INPATIENT. Inpatient status is judged to be reasonable and necessary in order to provide the required intensity of service to ensure the patient's safety. The patient's presenting symptoms, physical exam findings, and initial radiographic and laboratory data in the context of their chronic comorbidities is felt to place them at high risk for further clinical deterioration. Furthermore, it is not anticipated that the patient will be medically stable for discharge from the hospital within 2 midnights of admission.   * I certify that at the point of admission it is my clinical judgment that the patient will require  inpatient hospital care spanning beyond 2 midnights from the point of admission due to high intensity of service, high risk for further deterioration and high frequency of surveillance required.*  Author: Frankey Shown, DO 05/14/2023 7:40 AM  For on call review [email or web address].

## 2023-05-14 DIAGNOSIS — R1084 Generalized abdominal pain: Secondary | ICD-10-CM

## 2023-05-14 DIAGNOSIS — R109 Unspecified abdominal pain: Secondary | ICD-10-CM

## 2023-05-14 DIAGNOSIS — I10 Essential (primary) hypertension: Secondary | ICD-10-CM | POA: Diagnosis not present

## 2023-05-14 DIAGNOSIS — R7989 Other specified abnormal findings of blood chemistry: Secondary | ICD-10-CM | POA: Insufficient documentation

## 2023-05-14 DIAGNOSIS — R112 Nausea with vomiting, unspecified: Secondary | ICD-10-CM | POA: Insufficient documentation

## 2023-05-14 DIAGNOSIS — N186 End stage renal disease: Secondary | ICD-10-CM | POA: Diagnosis not present

## 2023-05-14 MED ORDER — ACETAMINOPHEN 650 MG RE SUPP
650.0000 mg | Freq: Four times a day (QID) | RECTAL | Status: DC | PRN
  Administered 2023-05-19: 650 mg via RECTAL
  Filled 2023-05-14: qty 1

## 2023-05-14 MED ORDER — ACETAMINOPHEN 325 MG PO TABS
650.0000 mg | ORAL_TABLET | Freq: Four times a day (QID) | ORAL | Status: DC | PRN
  Administered 2023-05-15: 325 mg via ORAL
  Filled 2023-05-14: qty 2

## 2023-05-14 MED ORDER — OXYCODONE HCL 5 MG PO TABS
5.0000 mg | ORAL_TABLET | Freq: Four times a day (QID) | ORAL | Status: DC | PRN
  Administered 2023-05-14 – 2023-05-16 (×3): 5 mg via ORAL
  Filled 2023-05-14 (×5): qty 1

## 2023-05-14 MED ORDER — ONDANSETRON HCL 4 MG/2ML IJ SOLN
4.0000 mg | Freq: Four times a day (QID) | INTRAMUSCULAR | Status: DC | PRN
  Administered 2023-05-14 – 2023-05-16 (×2): 4 mg via INTRAVENOUS
  Filled 2023-05-14 (×3): qty 2

## 2023-05-14 MED ORDER — METOCLOPRAMIDE HCL 5 MG/ML IJ SOLN: 5.0000 mg | Freq: Three times a day (TID) | INTRAMUSCULAR | Status: DC

## 2023-05-14 MED ORDER — PANTOPRAZOLE SODIUM 40 MG IV SOLR
40.0000 mg | INTRAVENOUS | Status: DC
  Administered 2023-05-14 – 2023-05-21 (×7): 40 mg via INTRAVENOUS
  Filled 2023-05-14 (×7): qty 10

## 2023-05-14 MED ORDER — TRAZODONE HCL 50 MG PO TABS
50.0000 mg | ORAL_TABLET | Freq: Every evening | ORAL | Status: DC | PRN
  Administered 2023-05-17: 50 mg via ORAL
  Filled 2023-05-14 (×2): qty 1

## 2023-05-14 MED ORDER — CARVEDILOL 3.125 MG PO TABS: 6.2500 mg | ORAL_TABLET | Freq: Two times a day (BID) | ORAL | Status: DC

## 2023-05-14 MED ORDER — HYDROMORPHONE HCL 1 MG/ML IJ SOLN
0.5000 mg | INTRAMUSCULAR | Status: DC | PRN
  Administered 2023-05-14 – 2023-05-23 (×15): 0.5 mg via INTRAVENOUS
  Filled 2023-05-14 (×16): qty 0.5

## 2023-05-14 MED ORDER — ONDANSETRON HCL 4 MG PO TABS: 4.0000 mg | ORAL_TABLET | Freq: Four times a day (QID) | ORAL | Status: DC | PRN

## 2023-05-14 MED ORDER — CHLORHEXIDINE GLUCONATE CLOTH 2 % EX PADS
6.0000 | MEDICATED_PAD | Freq: Every day | CUTANEOUS | Status: DC
  Administered 2023-05-14 – 2023-05-23 (×9): 6 via TOPICAL

## 2023-05-14 MED ORDER — METOCLOPRAMIDE HCL 5 MG/ML IJ SOLN
5.0000 mg | Freq: Three times a day (TID) | INTRAMUSCULAR | Status: AC
  Administered 2023-05-14 – 2023-05-15 (×3): 5 mg via INTRAVENOUS
  Filled 2023-05-14 (×3): qty 2

## 2023-05-14 NOTE — Plan of Care (Signed)

## 2023-05-14 NOTE — TOC CM/SW Note (Signed)
Transition of Care [facility]) - Inpatient Brief Assessment   Patient Details  Name: Felicia Williams MRN: [id number] Date of Birth: Dec 09, 1940  Transition of Care [facility]) CM/SW Contact:    Villa Herb, LCSWA Phone Number: 05/14/2023, 3:34 PM   Clinical Narrative: CSW updated by Merry Proud with [facility] who states that pt is active with them for [facility] PT. At this time pt has a pending PT eval. TOC to follow for follow up recommendations and D/C needs. TOC to follow.   Transition of Care Asessment: Insurance and Status: Insurance coverage has been reviewed Patient has primary care physician: Yes Home environment has been reviewed: from home Prior level of function:: some assistance Prior/Current Home Services: No current home services Social Determinants of Health Reivew: SDOH reviewed no interventions necessary Readmission risk has been reviewed: Yes Transition of care needs: no transition of care needs at this time

## 2023-05-14 NOTE — Consult Note (Signed)
[facility] Renal Consultation Note    Indication for Consultation:  Management of ESRD/hemodialysis; anemia, hypertension/volume and secondary hyperparathyroidism  HPI: Felicia Williams is a 82 y.o. 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  She did not go to HD on 05/13/23 due to N/V/D.  In the ED, Temp 98.9, BP 183/65, HR 70, SpO2 99%, weight 42.6kg.  Labs notable for K 5.3, BUN 116, Cr 9.25, Ca 8.2, BNP 3266, troponin I 158, Hgb 12.9, WBC 6.  CXR with mild to moderate pulm vascular congestion and small to moderate bilateral pleural effusions.  CT of abd/pelvis without obstructive or inflammatory changes of the colon.  She was admitted for further evaluation and management and we were consulted to provide dialysis during her hospitalization.    N/V better but still complaining of epigastric pain. She was recently admitted 10/8-10/10/24 at [facility] with infected RUE AVG s/p excision replaced with an artegraft.  She was given IV abx and then started on doxycycline for 2 weeks.  Past Medical History:  Diagnosis Date   Anemia in ESRD (end-stage renal disease) (HCC) 08/18/2018   CHF (congestive heart failure) (HCC)    Coccyx contusion--with chronic pain due to fall    Depression    Dyspnea    02 2l via [place]   ESRD on hemodialysis (HCC)    Essential hypertension, benign    Gastric polyps    Gastroparesis    followed by Dr. Karilyn Cota.   GERD (gastroesophageal reflux disease)    Glomerulonephritis    Gout    Mixed hyperlipidemia    Spondylosis    Past Surgical History:  Procedure Laterality Date   APPENDECTOMY     AV FISTULA PLACEMENT Left 03/20/2022   Procedure:  INSERTION OF LEFT UPPER ARM ARTERIOVENOUS (AV) GORE-TEX GRAFT;  Surgeon: Larina Earthly, MD;  Location: [facility] ORS;  Service: Vascular;  Laterality: Left;   CATARACT EXTRACTION Bilateral    COLONOSCOPY N/A 12/28/2015   Procedure: COLONOSCOPY;  Surgeon: Malissa Hippo, MD;  Location: [facility] END[facility] SUITE;  Service: Endoscopy;  Laterality: N/A;  815   ESOPHAGOGASTRODUODENOSCOPY N/A 11/16/2020   Procedure: ESOPHAGOGASTRODUODENOSCOPY (EGD);  Surgeon: Malissa Hippo, MD;  Location: [facility] SU[facility];  Service: Endoscopy;  Laterality: N/A;  1:15   EXCHANGE OF A DIALYSIS CATHETER N/A 03/20/2022   Procedure: EXCHANGE OF A DIALYSIS CATHETER;  Surgeon: Larina Earthly, MD;  Location: [facility] ORS;  Service: Vascular;  Laterality: N/A;   INSERTION OF DIALYSIS CATHETER Right 09/30/2021   Procedure: INSERTION OF TUNNELED DIALYSIS CATHETER;  Surgeon: Maeola Harman, MD;  Location: [facility] OR;  Service: Vascular;  Laterality: Right;   IR FLUORO GUIDE CV LINE RIGHT  01/16/2019   IR REMOVAL TUN CV CATH W/O FL  07/01/2019   IR THORACENTESIS ASP PLEURAL SPACE W/IMG GUIDE  05/01/2023   IR THROMBECTOMY AV FISTULA W/THROMBOLYSIS/PTA INC/SHUNT/IMG LEFT Left 02/01/2023   IR US GUIDE VASC ACCESS LEFT  02/01/2023   IR US GUIDE VASC ACCESS RIGHT  01/16/2019   POLYPECTOMY  11/16/2020   Procedure: POLYPECTOMY;  Surgeon: Malissa Hippo, MD;  Location: [facility];  Service: Endoscopy;;  gastric   REMOVAL OF A DIALYSIS CATHETER  09/30/2021   Procedure: REMOVAL  OF A PERITONEAL DIALYSIS CATHETER;  Surgeon: Maeola Harman, MD;  Location: [facility] OR;  Service: Vascular;;   REMOVAL OF A DIALYSIS CATHETER N/A 05/22/2022   Procedure: MINOR REMOVAL OF A TUNNELED DIALYSIS CATHETER;  Surgeon: Larina Earthly, MD;  Location: [facility];  Service: Vascular;  Laterality: N/A;   REVISON OF ARTERIOVENOUS FISTULA Left 04/30/2023   Procedure: LEFT ARM FISTULA REVISION, INTERPOSITIONAL ARTEGRAFT;  Surgeon: Victorino Sparrow, MD;  Location: [facility] OR;   Service: Vascular;  Laterality: Left;   TUBAL LIGATION     Family History:   Family History  Problem Relation Age of Onset   CAD Father    Heart attack Father    Diabetes Mellitus II Father    Hypertension Father    Lupus Brother    Social History:  reports that she has never smoked. She has never been exposed to tobacco smoke. She has never used smokeless tobacco. She reports that she does not drink alcohol and does not use drugs. Allergies  Allergen Reactions   Cefepime Other (See Comments)    Feb 2023 Encephalopathy with questionable seizures. Seems to be tolerating cefazolin , but the daughter mentioned not to start her on any cephalosporin     Morphine Other (See Comments)    "Dry heaving like crazy"   Sulfa Antibiotics Other (See Comments)    Shut pt's kidneys down   Prior to Admission medications   Medication Sig Start Date End Date Taking? Authorizing Provider  carvedilol (COREG) 12.5 MG tablet Take 1 tablet by mouth 2 (two) times daily.    [provider]  doxycycline (VIBRA-TABS) 100 MG tablet Take 1 tablet (100 mg total) by mouth 2 (two) times daily for 14 days. 05/02/23 05/16/23  Emilie Rutter, PA-C  hydrALAZINE (APRESOLINE) 50 MG tablet Take 50 mg by mouth 2 (two) times daily. Do not take morning dose on Dialysis days. Monday,Wednesday and Friday    [provider]  nitroGLYCERIN (NITROSTAT) 0.4 MG SL tablet Place 0.4 mg under the tongue every 5 (five) minutes as needed for chest pain. 12/28/22   [provider]  oxyCODONE (OXY IR/ROXICODONE) 5 MG immediate release tablet Take 1 tablet (5 mg total) by mouth every 6 (six) hours as needed. 05/02/23   Emilie Rutter, PA-C  traZODone (DESYREL) 100 MG tablet Take 100 mg by mouth at bedtime.    [provider]   Current Facility-Administered Medications  Medication Dose Route Frequency Provider Last Rate Last Admin   acetaminophen (TYLENOL) tablet 650 mg  650 mg Oral Q6H PRN Adefeso,  [place], DO       Or   acetaminophen (TYLENOL) suppository 650 mg  650 mg Rectal Q6H PRN Adefeso, Oladapo, DO       HYDROmorphone (DILAUDID) injection 0.5 mg  0.5 mg Intravenous Q4H PRN Adefeso, Oladapo, DO       ondansetron (ZOFRAN) tablet 4 mg  4 mg Oral Q6H PRN Adefeso, Oladapo, DO       Or   ondansetron (ZOFRAN) injection 4 mg  4 mg Intravenous Q6H PRN Adefeso, Oladapo, DO   4 mg at 05/14/23 0850   oxyCODONE (Oxy IR/ROXICODONE) immediate release tablet 5 mg  5 mg Oral Q6H PRN Adefeso, Oladapo, DO   5 mg at 05/14/23 0811   sodium zirconium cyclosilicate (LOKELMA) packet 5 g  5 g Oral Once Bethann Berkshire, MD       Labs: Basic Metabolic Panel: Recent Labs  Lab 05/13/23 1325  NA 139  K  5.3*  CL 98  CO2 23  GLUCOSE 109*  BUN 116*  CREATININE 9.25*  CALCIUM 8.2*   Liver Function Tests: No results for input(s): "AST", "ALT", "ALKPHOS", "BILITOT", "PROT", "ALBUMIN" in the last 168 hours. No results for input(s): "LIPASE", "AMYLASE" in the last 168 hours. No results for input(s): "AMMONIA" in the last 168 hours. CBC: Recent Labs  Lab 05/13/23 1325  WBC 6.0  HGB 12.9  HCT 40.7  MCV 85.9  PLT 209   Cardiac Enzymes: No results for input(s): "CKTOTAL", "CKMB", "CKMBINDEX", "TROPONINI" in the last 168 hours. CBG: No results for input(s): "GLUCAP" in the last 168 hours. Iron Studies: No results for input(s): "IRON", "TIBC", "TRANSFERRIN", "FERRITIN" in the last 72 hours. Studies/Results: CT ABDOMEN PELVIS WO CONTRAST  Result Date: 05/13/2023 CLINICAL DATA:  Acute abdominal pain with nausea and vomiting for several days EXAM: CT ABDOMEN AND PELVIS WITHOUT CONTRAST TECHNIQUE: Multidetector CT imaging of the abdomen and pelvis was performed following the standard protocol without IV contrast. RADIATION DOSE REDUCTION: This exam was performed according to the departmental dose-optimization program which includes automated exposure control, adjustment of the mA and/or kV according to  patient size and/or use of iterative reconstruction technique. COMPARISON:  09/15/2022 FINDINGS: Lower chest: Moderate to large pleural effusions are noted bilaterally with associated lower lobe consolidation increased in the interval from the prior exam. Hepatobiliary: Liver is within normal limits. Gallbladder is well distended with a single dependent gallstone. Pancreas: Stable cystic lesion is noted within the pancreatic body unchanged from the recent exam. Follow-up as previously described. Spleen: Normal in size without focal abnormality. Adrenals/Urinary Tract: Adrenal glands are within normal limits. Kidneys are well visualized bilaterally with diffuse renal vascular calcifications. No definitive calculi are seen. No obstructive changes are noted. Bladder is decompressed. Stomach/Bowel: No obstructive or inflammatory changes of the colon are noted. The appendix is not well visualized consistent with prior surgical history. Small bowel and stomach are within normal limits. Vascular/Lymphatic: Aortic atherosclerosis. No enlarged abdominal or pelvic lymph nodes. Reproductive: Uterus and bilateral adnexa are unremarkable. Other: No abdominal wall hernia or abnormality. No abdominopelvic ascites. Musculoskeletal: Degenerative changes are noted without acute abnormality. IMPRESSION: Increased bilateral pleural effusions with lower lobe consolidation when compare with the prior exam. Chronic changes as described. Electronically Signed   By: Alcide Clever M.D.   On: 05/13/2023 21:35   DG Chest 2 View  Result Date: 05/13/2023 CLINICAL DATA:  Chest pain. EXAM: CHEST - 2 VIEW COMPARISON:  05/01/2023. FINDINGS: Mild-to-moderate pulmonary vascular congestion noted. There are bilateral small-to-moderate pleural effusions, increased since the prior study. Bilateral lung fields are otherwise clear. Evaluation of cardiomediastinal silhouette is nondiagnostic due to bilateral lower hemithorax opacification. No acute  osseous abnormalities. The soft tissues are within normal limits. Right IJ hemodialysis catheter noted with its tip overlying the cavoatrial junction region. IMPRESSION: *Mild-to-moderate pulmonary vascular congestion. Small to moderate bilateral pleural effusions, increased since the prior study. Electronically Signed   By: Jules Schick M.D.   On: 05/13/2023 16:20    ROS: Pertinent items are noted in HPI. Physical Exam: Vitals:   05/14/23 0030 05/14/23 0130 05/14/23 0158 05/14/23 0532  BP: (!) 156/65 (!) 146/56 (!) 169/71 (!) 176/61  Pulse: 73 76 78 80  Resp: (!) 23 (!) 27 20 18   Temp:   97.7 F (36.5 C) 98.6 F (37 C)  TempSrc:      SpO2: 97% 96% 92% 92%  Weight:  Weight change:   Intake/Output Summary (Last 24 hours) at 05/14/2023 0921 Last data filed at 05/14/2023 0500 Gross per 24 hour  Intake 120 ml  Output --  Net 120 ml   BP (!) 176/61 (BP Location: Right Leg)   Pulse 80   Temp 98.6 F (37 C)   Resp 18   Wt 42.6 kg   SpO2 92%   BMI 18.99 kg/m  General appearance: alert, cooperative, and no distress Head: Normocephalic, without obvious abnormality, atraumatic Eyes: negative findings: lids and lashes normal, conjunctivae and sclerae normal, and corneas clear Resp: clear to auscultation bilaterally Cardio: regular rate and rhythm, S1, S2 normal, no murmur, click, rub or gallop GI: +BS, soft, mildly tender in epigastric region, no guarding or rebound Extremities: extremities normal, atraumatic, no cyanosis or edema and s/p excision of LUE AVG. Dialysis Access: RIJ [facility]  Dialysis Orders: Center: [name] MWF  3h  400/500   2K/2.5Ca bath  Heparin 1000 bolus + 1200u/hr  RIJ TDC - EDW 42kg IVPN with HD Micera 100 mcg q2 weeks (was due 05/13/23) Venofer 50 mg IV once per week (was due 05/13/23)  Assessment/Plan:  Abdominal pain/N/V/D - CT scan without obstruction or inflammatory changes to bowels.  Did have single gallstone and stable pancreatic  cyst.  Will order lipase and amylase.  LFT's WNL except for low alb and tot protein.  Pain management per primary svc.  Possibly related to doxycycline.  ESRD -  Off schedule.  Plan for HD today and then will eventually get back on schedule.  Hypertension/volume  - has evidence of volume overload, however she is only 0.6kg above edw.  Will challenge edw given HTN and pulm edema/effusions.  Anemia  - Hgb 12.9, no indication for ESA  Metabolic bone disease -   continue with home meds  Nutrition - per primary svc  Combined systolic and diastolic CHF - as above UF as tolerated.  Bilateral pleural effusions - for thoracentesis tomorrow.  Hyperkalemia  - plan for HD today and follow.   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 Karin Lieu.  To complete doxycycline in 2 days.   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  Amylase and lipase as above.   Irena Cords, MD [facility], [facility] 05/14/2023, 9:21 AM

## 2023-05-14 NOTE — Progress Notes (Signed)
Patient and daughter arrived to room. Patient alert and oriented x4. VSS. Call bell within reach and bed alarm on.

## 2023-05-14 NOTE — Progress Notes (Signed)
[facility]                                                                               Felicia Williams, is a 82 y.o. female, DOB - 29-Dec-1940, OZH:[id number] Admit date - 05/13/2023    Outpatient Primary MD for the patient is Milinda Cave, [place], NP  LOS - 1  days    Brief summary   Felicia Williams is an 83 y.o. 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    CT abdomen pelvis without contrast showed increased bilateral  pleural effusions with lower lobe consolidation when compared with the prior exam. Protonix was given, Reglan was given.  Lokelma due to hyperkalemia was also given. Hospitalist was asked to admit patient for further evaluation and management.     Assessment & Plan    Assessment and Plan:   Nausea, vomiting and abdominal pain:  Suspect from gastroparesis, as she reports having a h/o it.  She is also on chronic narcotics, which probably worsens it.  Symptomatic management with IV reglan, .  CT abd and pelvis is negative for acute pathology in the abdomen that would explain the above symptoms.  Recommend avoiding IV narcotic pain meds for now.  Other differentials include gastritis.  Will start her on iv Protonix 40 mg daily.    ESRD on HD Nephrology on  board and further HD as per nephrology.  She missed HD yesterday.    Hyperkalemia:  Got lokelma yesterday afternoon.  Recheck BMP today.   H/o chronic systolic heart failure:  Mildly elevated troponins.  Elevated BNP  CXR and CT showing  moderate bilateral  pleural effusion and pulmonary vascular congestion.  Suspect from demand ischemia from fluid overload from missing HD.  She denies chest pain. EKG does not show any ischemic changes.  Last echo shows  LVEF of 35% to 40%  with global hypokinesis and grade 1 diastolic dysfunction.   She will need follow up with cardiology as she reports not following up with cardiology .    Hypertension Not well controlled, restarted pt on coreg.   H/o Opioid dependency for  chronic back pain.   RN Pressure Injury Documentation: Pressure Injury 05/14/23 Sacrum Left Stage 2 -  Partial thickness loss of dermis presenting as a shallow open injury with a red, pink wound bed without slough. 2 x 1 area stage 2 (Active)  05/14/23 0200  Location: Sacrum  Location Orientation: Left  Staging: Stage 2 -  Partial thickness loss of dermis presenting as a shallow open injury with a red, pink wound bed without slough.  Wound Description (Comments): 2 x 1 area stage 2  Present on Admission: Yes  Dressing Type Foam - Lift dressing to assess site every shift 05/14/23 0223    Estimated body mass index is 18.99 kg/m as calculated from the following:   Height as of 04/30/23: 4\' 11"  (1.499 m).   Weight as of this encounter: 42.6 kg.  Code Status: full  code.  DVT Prophylaxis:  SCDs Start: 05/14/23 0732   Level of Care: Level of care: Telemetry Family Communication: Updated patient's daughter at bedside.   Disposition Plan:     Remains inpatient appropriate:  for persistent vomiting.   Procedures:  None.  Consultants:   Nephrology.   Antimicrobials:   Anti-infectives (From admission, onward)    None        Medications  Scheduled Meds:  Chlorhexidine Gluconate Cloth  6 each Topical Q0600   metoCLOPramide (REGLAN) injection  5 mg Intravenous Q8H   sodium zirconium cyclosilicate  5 g Oral Once   Continuous Infusions: PRN Meds:.acetaminophen **OR** acetaminophen, HYDROmorphone (DILAUDID) injection, ondansetron **OR** ondansetron (ZOFRAN) IV, oxyCODONE, traZODone    Subjective:   Felicia Williams was seen and examined today.  Vomiting this am.  Intermittent abd pain.  Nauseated.   Objective:    Vitals:   05/14/23 0030 05/14/23 0130 05/14/23 0158 05/14/23 0532  BP: (!) 156/65 (!) 146/56 (!) 169/71 (!) 176/61  Pulse: 73 76 78 80  Resp: (!) 23 (!) 27 20 18   Temp:   97.7 F (36.5 C) 98.6 F (37 C)  TempSrc:      SpO2: 97% 96% 92% 92%  Weight:        Intake/Output Summary (Last 24 hours) at 05/14/2023 1438 Last data filed at 05/14/2023 0500 Gross per 24 hour  Intake 120 ml  Output --  Net 120 ml   Filed Weights   05/13/23 1310  Weight: 42.6 kg     Exam General exam: Appears calm and comfortable  Respiratory system: Clear to auscultation. Respiratory effort normal. Cardiovascular system: S1 & S2 heard, RRR. No JVD Gastrointestinal system: Abdomen is nondistended, soft and nontender.  Central nervous system: Alert and oriented. Extremities: Symmetric 5 x 5 power. Skin: No rashes Psychiatry: mood is appropriate.     Data Reviewed:  I have personally reviewed following labs and imaging studies   CBC Lab Results  Component Value Date   WBC 6.0 05/13/2023   RBC 4.74 05/13/2023   HGB 12.9 05/13/2023   HCT 40.7 05/13/2023   MCV 85.9 05/13/2023   MCH 27.2 05/13/2023   PLT 209 05/13/2023   MCHC 31.7 05/13/2023   RDW 18.3 (H) 05/13/2023   LYMPHSABS 1.2 02/11/2023   MONOABS 0.7 02/11/2023   EOSABS 0.3 02/11/2023   BASOSABS 0.1 02/11/2023     Last metabolic panel Lab Results  Component Value Date   NA 139 05/13/2023   K 5.3 (H) 05/13/2023   CL 98 05/13/2023   CO2 23 05/13/2023   BUN 116 (H) 05/13/2023   CREATININE 9.25 (H) 05/13/2023   GLUCOSE 109 (H) 05/13/2023   GFRNONAA 4 (L) 05/13/2023   GFRAA 11 (L) 01/20/2019   CALCIUM 8.2 (L) 05/13/2023   PHOS 4.7 (H) 05/02/2023   PROT 6.2 (L) 05/01/2023   ALBUMIN 2.1 (L) 05/02/2023   LABGLOB 3.1 04/29/2018   AGRATIO 1.0 04/29/2018   BILITOT 0.5 05/01/2023   ALKPHOS 71 05/01/2023   AST 22 05/01/2023   ALT 13 05/01/2023   ANIONGAP 18 (H) 05/13/2023    CBG (last 3)  No results for input(s):  "GLUCAP" in the last 72 hours.    Coagulation Profile: No results for input(s): "INR", "PROTIME" in the last 168 hours.   Radiology Studies: CT ABDOMEN PELVIS WO CONTRAST  Result Date: 05/13/2023 CLINICAL DATA:  Acute abdominal pain with nausea and vomiting for several days EXAM: CT ABDOMEN AND PELVIS WITHOUT CONTRAST TECHNIQUE: Multidetector  CT imaging of the abdomen and pelvis was performed following the standard protocol without IV contrast. RADIATION DOSE REDUCTION: This exam was performed according to the departmental dose-optimization program which includes automated exposure control, adjustment of the mA and/or kV according to patient size and/or use of iterative reconstruction technique. COMPARISON:  09/15/2022 FINDINGS: Lower chest: Moderate to large pleural effusions are noted bilaterally with associated lower lobe consolidation increased in the interval from the prior exam. Hepatobiliary: Liver is within normal limits. Gallbladder is well distended with a single dependent gallstone. Pancreas: Stable cystic lesion is noted within the pancreatic body unchanged from the recent exam. Follow-up as previously described. Spleen: Normal in size without focal abnormality. Adrenals/Urinary Tract: Adrenal glands are within normal limits. Kidneys are well visualized bilaterally with diffuse renal vascular calcifications. No definitive calculi are seen. No obstructive changes are noted. Bladder is decompressed. Stomach/Bowel: No obstructive or inflammatory changes of the colon are noted. The appendix is not well visualized consistent with prior surgical history. Small bowel and stomach are within normal limits. Vascular/Lymphatic: Aortic atherosclerosis. No enlarged abdominal or pelvic lymph nodes. Reproductive: Uterus and bilateral adnexa are unremarkable. Other: No abdominal wall hernia or abnormality. No abdominopelvic ascites. Musculoskeletal: Degenerative changes are noted without acute abnormality.  IMPRESSION: Increased bilateral pleural effusions with lower lobe consolidation when compare with the prior exam. Chronic changes as described. Electronically Signed   By: Alcide Clever M.D.   On: 05/13/2023 21:35   DG Chest 2 View  Result Date: 05/13/2023 CLINICAL DATA:  Chest pain. EXAM: CHEST - 2 VIEW COMPARISON:  05/01/2023. FINDINGS: Mild-to-moderate pulmonary vascular congestion noted. There are bilateral small-to-moderate pleural effusions, increased since the prior study. Bilateral lung fields are otherwise clear. Evaluation of cardiomediastinal silhouette is nondiagnostic due to bilateral lower hemithorax opacification. No acute osseous abnormalities. The soft tissues are within normal limits. Right IJ hemodialysis catheter noted with its tip overlying the cavoatrial junction region. IMPRESSION: *Mild-to-moderate pulmonary vascular congestion. Small to moderate bilateral pleural effusions, increased since the prior study. Electronically Signed   By: Jules Schick M.D.   On: 05/13/2023 16:20       Kathlen Mody M.D. [facility] 05/14/2023, 2:38 PM  Available via Epic secure chat 7am-7pm After 7 pm, please refer to night coverage provider listed on amion.

## 2023-05-14 NOTE — Progress Notes (Addendum)
Mobility Specialist Progress Note:    05/14/23 1400  Mobility  Activity Ambulated with assistance in room  Level of Assistance Contact guard assist, steadying assist  Assistive Device Front wheel walker  Distance Ambulated (ft) 15 ft  Range of Motion/Exercises Active;All extremities  Activity Response Tolerated well  Mobility Referral Yes  $Mobility charge 1 Mobility  Mobility Specialist Start Time (ACUTE ONLY) 1400  Mobility Specialist Stop Time (ACUTE ONLY) 1420  Mobility Specialist Time Calculation (min) (ACUTE ONLY) 20 min   Pt received sitting EOB, daughter in room. Agreeable to mobility, required CGA to stand and ambulate with RW. Tolerated well, deferred further ambulation d/t dizziness. SpO2 96% on 2L throughout session. Returned pt sitting EOB, recovered. Left pt in room with daughter, all needs met.   Lawerance Bach Mobility Specialist Please contact via Special educational needs teacher or  Rehab office at [phone]

## 2023-05-14 NOTE — Plan of Care (Signed)
  Problem: Education: Goal: Knowledge of General Education information will improve Description Including pain rating scale, medication(s)/side effects and non-pharmacologic comfort measures Outcome: Progressing   Problem: Health Behavior/Discharge Planning: Goal: Ability to manage health-related needs will improve Outcome: Progressing   

## 2023-05-15 ENCOUNTER — Other Ambulatory Visit (HOSPITAL_COMMUNITY): Payer: Medicare Other

## 2023-05-15 DIAGNOSIS — R1084 Generalized abdominal pain: Secondary | ICD-10-CM | POA: Diagnosis not present

## 2023-05-15 LAB — RENAL FUNCTION PANEL
Albumin: 2.4 g/dL — ABNORMAL LOW (ref 3.5–5.0)
Albumin: 2.3 g/dL — ABNORMAL LOW (ref 3.5–5.0)
Anion gap: 16 — ABNORMAL HIGH (ref 5–15)
Anion gap: 14 (ref 5–15)
BUN: 47 mg/dL — ABNORMAL HIGH (ref 8–23)
BUN: 48 mg/dL — ABNORMAL HIGH (ref 8–23)
CO2: 20 mmol/L — ABNORMAL LOW (ref 22–32)
CO2: 24 mmol/L (ref 22–32)
Calcium: 7.9 mg/dL — ABNORMAL LOW (ref 8.9–10.3)
Calcium: 7.9 mg/dL — ABNORMAL LOW (ref 8.9–10.3)
Chloride: 94 mmol/L — ABNORMAL LOW (ref 98–111)
Chloride: 92 mmol/L — ABNORMAL LOW (ref 98–111)
Creatinine, Ser: 4.76 mg/dL — ABNORMAL HIGH (ref 0.44–1.00)
Creatinine, Ser: 4.85 mg/dL — ABNORMAL HIGH (ref 0.44–1.00)
GFR, Estimated: 9 mL/min — ABNORMAL LOW (ref >60)
GFR, Estimated: 8 mL/min — ABNORMAL LOW (ref >60)
Glucose, Bld: 109 mg/dL — ABNORMAL HIGH (ref 70–99)
Glucose, Bld: 90 mg/dL (ref 70–99)
Phosphorus: 6.8 mg/dL — ABNORMAL HIGH (ref 2.5–4.6)
Phosphorus: 6.9 mg/dL — ABNORMAL HIGH (ref 2.5–4.6)
Potassium: 3.9 mmol/L (ref 3.5–5.1)
Potassium: 3.9 mmol/L (ref 3.5–5.1)
Sodium: 130 mmol/L — ABNORMAL LOW (ref 135–145)
Sodium: 130 mmol/L — ABNORMAL LOW (ref 135–145)

## 2023-05-15 LAB — COMPREHENSIVE METABOLIC PANEL
ALT: 9 U/L (ref 0–44)
AST: 15 U/L (ref 15–41)
Albumin: 2.3 g/dL — ABNORMAL LOW (ref 3.5–5.0)
Alkaline Phosphatase: 82 U/L (ref 38–126)
Anion gap: 22 — ABNORMAL HIGH (ref 5–15)
BUN: 142 mg/dL — ABNORMAL HIGH (ref 8–23)
CO2: 17 mmol/L — ABNORMAL LOW (ref 22–32)
Calcium: 7.5 mg/dL — ABNORMAL LOW (ref 8.9–10.3)
Chloride: 94 mmol/L — ABNORMAL LOW (ref 98–111)
Creatinine, Ser: 10.29 mg/dL — ABNORMAL HIGH (ref 0.44–1.00)
GFR, Estimated: 3 mL/min — ABNORMAL LOW (ref >60)
Glucose, Bld: 79 mg/dL (ref 70–99)
Potassium: 6.2 mmol/L — ABNORMAL HIGH (ref 3.5–5.1)
Sodium: 133 mmol/L — ABNORMAL LOW (ref 135–145)
Total Bilirubin: 0.6 mg/dL (ref 0.3–1.2)
Total Protein: 6.2 g/dL — ABNORMAL LOW (ref 6.5–8.1)

## 2023-05-15 LAB — CBC
HCT: 33.7 % — ABNORMAL LOW (ref 36.0–46.0)
Hemoglobin: 10.8 g/dL — ABNORMAL LOW (ref 12.0–15.0)
MCH: 26.9 pg (ref 26.0–34.0)
MCHC: 32 g/dL (ref 30.0–36.0)
MCV: 84 fL (ref 80.0–100.0)
Platelets: 243 10*3/uL (ref 150–400)
RBC: 4.01 MIL/uL (ref 3.87–5.11)
RDW: 17.8 % — ABNORMAL HIGH (ref 11.5–15.5)
WBC: 8.1 10*3/uL (ref 4.0–10.5)
nRBC: 0 % (ref 0.0–0.2)

## 2023-05-15 LAB — MAGNESIUM: Magnesium: 2.1 mg/dL (ref 1.7–2.4)

## 2023-05-15 LAB — PHOSPHORUS: Phosphorus: 12.6 mg/dL — ABNORMAL HIGH (ref 2.5–4.6)

## 2023-05-15 LAB — LIPASE, BLOOD: Lipase: 38 U/L (ref 11–51)

## 2023-05-15 LAB — GLUCOSE, CAPILLARY: Glucose-Capillary: 120 mg/dL — ABNORMAL HIGH (ref 70–99)

## 2023-05-15 LAB — AMYLASE: Amylase: 38 U/L (ref 28–100)

## 2023-05-15 MED ORDER — HEPARIN SODIUM (PORCINE) 1000 UNIT/ML IJ SOLN
INTRAMUSCULAR | Status: AC
Start: 2023-05-15 — End: ?
  Filled 2023-05-15: qty 4

## 2023-05-15 MED ORDER — LIDOCAINE HCL (PF) 2 % IJ SOLN
INTRAMUSCULAR | Status: AC
Start: 2023-05-15 — End: ?
  Filled 2023-05-15: qty 10

## 2023-05-15 MED ORDER — CALCIUM GLUCONATE-NACL 1-0.675 GM/50ML-% IV SOLN
1.0000 g | Freq: Once | INTRAVENOUS | Status: AC
  Administered 2023-05-15: 1000 mg via INTRAVENOUS
  Filled 2023-05-15: qty 50

## 2023-05-15 MED ORDER — ACETAMINOPHEN 325 MG PO TABS
ORAL_TABLET | ORAL | Status: AC
Start: 2023-05-15 — End: ?
  Filled 2023-05-15: qty 1

## 2023-05-15 MED ORDER — SODIUM ZIRCONIUM CYCLOSILICATE 10 G PO PACK
10.0000 g | PACK | Freq: Once | ORAL | Status: DC
  Filled 2023-05-15: qty 1

## 2023-05-15 MED ORDER — CARVEDILOL 12.5 MG PO TABS
12.5000 mg | ORAL_TABLET | Freq: Two times a day (BID) | ORAL | Status: DC
  Administered 2023-05-15 – 2023-05-17 (×5): 12.5 mg via ORAL
  Filled 2023-05-15 (×9): qty 1

## 2023-05-15 MED ORDER — HYDRALAZINE HCL 25 MG PO TABS
50.0000 mg | ORAL_TABLET | Freq: Two times a day (BID) | ORAL | Status: DC
  Administered 2023-05-15 – 2023-05-16 (×3): 50 mg via ORAL
  Filled 2023-05-15 (×8): qty 2

## 2023-05-15 MED ORDER — HYDRALAZINE HCL 20 MG/ML IJ SOLN
10.0000 mg | INTRAMUSCULAR | Status: DC | PRN
  Administered 2023-05-15 – 2023-05-18 (×2): 10 mg via INTRAVENOUS
  Filled 2023-05-15 (×2): qty 1

## 2023-05-15 MED ORDER — HEPARIN SODIUM (PORCINE) 1000 UNIT/ML IJ SOLN
4000.0000 [IU] | Freq: Once | INTRAMUSCULAR | Status: AC
Start: 2023-05-15 — End: 2023-05-15
  Administered 2023-05-15: 4000 [IU]

## 2023-05-15 NOTE — Progress Notes (Signed)
Patient sleepy for most of day , she did awake for dinner, family and patient declined to have thoracentesis done this shift. Dr. Jarvis Newcomer is aware. Patients daughter at bedside for whole shift, she request to speak with someone about power of attorney , placed request for chaplain to come and speak with daughter,

## 2023-05-15 NOTE — Progress Notes (Signed)
   05/15/23 0416  Vitals  BP (!) 135/47  MAP (mmHg) 74  Pulse Rate 74  ECG Heart Rate 74  Resp 18  Oxygen Therapy  SpO2 98 %  During Treatment Monitoring  Cumulative Fluid Removed (mL) per Treatment  1900  HD Safety Checks Performed Yes  Intra-Hemodialysis Comments Tx completed  Post Treatment  Dialyzer Clearance Lightly streaked  Hemodialysis Intake (mL) 100 mL  Liters Processed 68.3  Fluid Removed (mL) 1900 mL  Tolerated HD Treatment Yes  Fistula / Graft Left Upper arm Arteriovenous vein graft  Placement Date: (c)    Placed prior to admission: Yes  Orientation: Left  Access Location: Upper arm  Access Type: Arteriovenous vein graft  Site Condition No complications   Back pain decrease from 9 to 3, catheter dressing changed transparent dressing with biopatch. Heparin 2ml in each port.

## 2023-05-15 NOTE — Progress Notes (Signed)
Patient given PRN hydralazxine   05/15/23 1239  Assess: MEWS Score  Temp 98.4 F (36.9 C)  BP (!) 216/100  MAP (mmHg) 134  Pulse Rate 88  Resp 20  SpO2 97 %  Assess: MEWS Score  MEWS Temp 0  MEWS Systolic 2  MEWS Pulse 0  MEWS RR 0  MEWS LOC 0  MEWS Score 2  MEWS Score Color Yellow  Assess: if the MEWS score is Yellow or Red  Were vital signs accurate and taken at a resting state? Yes  Does the patient meet 2 or more of the SIRS criteria? No  MEWS guidelines implemented  Yes, yellow  Treat  MEWS Interventions Considered administering scheduled or prn medications/treatments as ordered  Take Vital Signs  Increase Vital Sign Frequency  Yellow: Q2hr x1, continue Q4hrs until patient remains green for 12hrs  Escalate  MEWS: Escalate Yellow: Discuss with charge nurse and consider notifying provider and/or RRT  Notify: Charge Nurse/RN  Name of Charge Nurse/RN Notified ANgel rn  Provider Notification  Provider Name/Title grunz  Date Provider Notified 05/15/23  Time Provider Notified 1249  Method of Notification Page  Notification Reason Change in status  Assess: SIRS CRITERIA  SIRS Temperature  0  SIRS Pulse 0  SIRS Respirations  0  SIRS WBC 0  SIRS Score Sum  0

## 2023-05-15 NOTE — Progress Notes (Signed)
Patient ID: Felicia Williams, female   DOB: [birth year], 82 y.o.   MRN: [id number] S: Feels better this morning.  Completed HD early this morning. O:BP (!) 158/54 (BP Location: Right Arm)   Pulse 86   Temp 98.6 F (37 C) (Oral)   Resp 20   Wt 39 kg   SpO2 94%   BMI 17.37 kg/m   Intake/Output Summary (Last 24 hours) at 05/15/2023 1120 Last data filed at 05/15/2023 0502 Gross per 24 hour  Intake 240 ml  Output 3800 ml  Net -3560 ml   Intake/Output: I/O last 3 completed shifts: In: 360 [P.O.:360] Out: 3800 [Other:3800]  Intake/Output this shift:  No intake/output data recorded. Weight change: -3.638 kg Gen: NAD CVS: RRR Resp:CTA Abd: +BS, soft, NT/ND Ext: no edema, LUE AVG +T/B, staples in place  Recent Labs  Lab 05/13/23 1325 05/15/23 0215 05/15/23 0224  NA 139  --  133*  K 5.3*  --  6.2*  CL 98  --  94*  CO2 23  --  17*  GLUCOSE 109*  --  79  BUN 116*  --  142*  CREATININE 9.25*  --  10.29*  ALBUMIN  --   --  2.3*  CALCIUM 8.2*  --  7.5*  PHOS  --  12.6*  --   AST  --   --  15  ALT  --   --  9   Liver Function Tests: Recent Labs  Lab 05/15/23 0224  AST 15  ALT 9  ALKPHOS 82  BILITOT 0.6  PROT 6.2*  ALBUMIN 2.3*   Recent Labs  Lab 05/15/23 0224  LIPASE 38  AMYLASE 38   No results for input(s): "AMMONIA" in the last 168 hours. CBC: Recent Labs  Lab 05/13/23 1325 05/15/23 0224  WBC 6.0 8.1  HGB 12.9 10.8*  HCT 40.7 33.7*  MCV 85.9 84.0  PLT 209 243   Cardiac Enzymes: No results for input(s): "CKTOTAL", "CKMB", "CKMBINDEX", "TROPONINI" in the last 168 hours. CBG: No results for input(s): "GLUCAP" in the last 168 hours.  Iron Studies: No results for input(s): "IRON", "TIBC", "TRANSFERRIN", "FERRITIN" in the last 72 hours. Studies/Results: CT ABDOMEN PELVIS WO CONTRAST  Result Date: 05/13/2023 CLINICAL DATA:  Acute abdominal pain with nausea and vomiting for several days EXAM: CT ABDOMEN AND PELVIS WITHOUT CONTRAST TECHNIQUE:  Multidetector CT imaging of the abdomen and pelvis was performed following the standard protocol without IV contrast. RADIATION DOSE REDUCTION: This exam was performed according to the departmental dose-optimization program which includes automated exposure control, adjustment of the mA and/or kV according to patient size and/or use of iterative reconstruction technique. COMPARISON:  09/15/2022 FINDINGS: Lower chest: Moderate to large pleural effusions are noted bilaterally with associated lower lobe consolidation increased in the interval from the prior exam. Hepatobiliary: Liver is within normal limits. Gallbladder is well distended with a single dependent gallstone. Pancreas: Stable cystic lesion is noted within the pancreatic body unchanged from the recent exam. Follow-up as previously described. Spleen: Normal in size without focal abnormality. Adrenals/Urinary Tract: Adrenal glands are within normal limits. Kidneys are well visualized bilaterally with diffuse renal vascular calcifications. No definitive calculi are seen. No obstructive changes are noted. Bladder is decompressed. Stomach/Bowel: No obstructive or inflammatory changes of the colon are noted. The appendix is not well visualized consistent with prior surgical history. Small bowel and stomach are within normal limits. Vascular/Lymphatic: Aortic atherosclerosis. No enlarged abdominal or pelvic lymph nodes. Reproductive: Uterus and bilateral adnexa are unremarkable.  Other: No abdominal wall hernia or abnormality. No abdominopelvic ascites. Musculoskeletal: Degenerative changes are noted without acute abnormality. IMPRESSION: Increased bilateral pleural effusions with lower lobe consolidation when compare with the prior exam. Chronic changes as described. Electronically Signed   By: Alcide Clever M.D.   On: 05/13/2023 21:35   DG Chest 2 View  Result Date: 05/13/2023 CLINICAL DATA:  Chest pain. EXAM: CHEST - 2 VIEW COMPARISON:  05/01/2023. FINDINGS:  Mild-to-moderate pulmonary vascular congestion noted. There are bilateral small-to-moderate pleural effusions, increased since the prior study. Bilateral lung fields are otherwise clear. Evaluation of cardiomediastinal silhouette is nondiagnostic due to bilateral lower hemithorax opacification. No acute osseous abnormalities. The soft tissues are within normal limits. Right IJ hemodialysis catheter noted with its tip overlying the cavoatrial junction region. IMPRESSION: *Mild-to-moderate pulmonary vascular congestion. Small to moderate bilateral pleural effusions, increased since the prior study. Electronically Signed   By: Jules Schick M.D.   On: 05/13/2023 16:20    carvedilol  12.5 mg Oral BID   Chlorhexidine Gluconate Cloth  6 each Topical Q0600   hydrALAZINE  50 mg Oral BID   pantoprazole (PROTONIX) IV  40 mg Intravenous Q24H   sodium zirconium cyclosilicate  10 g Oral Once    BMET    Component Value Date/Time   NA 133 (L) 05/15/2023 0224   K 6.2 (H) 05/15/2023 0224   CL 94 (L) 05/15/2023 0224   CO2 17 (L) 05/15/2023 0224   GLUCOSE 79 05/15/2023 0224   BUN 142 (H) 05/15/2023 0224   CREATININE 10.29 (H) 05/15/2023 0224   CREATININE 5.47 (H) 08/16/2022 1437   CALCIUM 7.5 (L) 05/15/2023 0224   CALCIUM 7.1 (L) 01/10/2019 0632   GFRNONAA 3 (L) 05/15/2023 0224   GFRAA 11 (L) 01/20/2019 0608   CBC    Component Value Date/Time   WBC 8.1 05/15/2023 0224   RBC 4.01 05/15/2023 0224   HGB 10.8 (L) 05/15/2023 0224   HCT 33.7 (L) 05/15/2023 0224   PLT 243 05/15/2023 0224   MCV 84.0 05/15/2023 0224   MCH 26.9 05/15/2023 0224   MCHC 32.0 05/15/2023 0224   RDW 17.8 (H) 05/15/2023 0224   LYMPHSABS 1.2 02/11/2023 0321   MONOABS 0.7 02/11/2023 0321   EOSABS 0.3 02/11/2023 0321   BASOSABS 0.1 02/11/2023 0321    Dialysis Orders: Center: [name] MWF  3h  400/500   2K/2.5Ca bath  Heparin 1000 bolus + 1200u/hr  RIJ TDC - EDW 42kg IVPN with HD Micera 100 mcg q2 weeks (was due  05/13/23) Venofer 50 mg IV once per week (was due 05/13/23)   Assessment/Plan:  Abdominal pain/N/V/D - CT scan without obstruction or inflammatory changes to bowels.  Did have single gallstone and stable pancreatic cyst.  Will order lipase and amylase.  LFT's WNL except for low alb and tot protein.  Pain management per primary svc.  Possibly related to doxycycline vs gastroparesis.  Feeling better today and tolerated reglan well.  ESRD -  Had HD early this morning.  Will recheck K level and if ok will get back on schedule by Friday.  Hypertension/volume  - has evidence of volume overload, however she is only 0.6kg above edw.  Will challenge edw given HTN and pulm edema/effusions.  Anemia  - Hgb 12.9, no indication for ESA  Metabolic bone disease -   continue with home meds  Nutrition - per primary svc  Combined systolic and diastolic CHF - as above UF as tolerated.  Bilateral pleural effusions -  for thoracentesis tomorrow.  Hyperkalemia  - Had HD early this morning and K of 6.2 was before she was started on HD.  Recheck this morning.     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 Karin Lieu.  To complete doxycycline probably today.   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  Amylase and lipase were WNL.   Irena Cords, MD [facility]

## 2023-05-15 NOTE — Evaluation (Signed)
Physical Therapy Evaluation Patient Details Name: Felicia Williams MRN: [id number] DOB: 04-03-1941 Today's Date: 05/15/2023  History of Present Illness  Felicia Williams is an 82 y.o. 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  Abdominal pain was sharp, it was initially intermittent, then it progressed to being constant and was rated as 8-9/10 on pain scale.  She was unable to go to dialysis yesterday due to continuous nausea and vomiting.  She endorsed decreased appetite, last vomitus was yesterday in the morning (10/21).   Clinical Impression  Patient demonstrates slow labored movement for sitting up at bedside with limited use of LUE due to recent HD catheter placement.  Patient very unsteady on feet and limited to a few steps in room before having to sit due to fatigue and generalized weakness.  Patient tolerated sitting up in chair after therapy with family members present.  Patient will benefit from continued skilled physical therapy in hospital and recommended venue below to increase strength, balance, endurance for safe ADLs and gait.            If plan is discharge home, recommend the following: A lot of help with walking and/or transfers;A lot of help with bathing/dressing/bathroom;Help with stairs or ramp for entrance;Assistance with cooking/housework   Can travel by private vehicle        Equipment Recommendations None recommended by PT  Recommendations for Other Services       Functional Status Assessment Patient has had a recent decline in their functional status and demonstrates the ability to make significant improvements in function in a reasonable and predictable amount of time.     Precautions / Restrictions Precautions Precautions:  Fall Precaution Comments: on 2L O2 baseline Restrictions Weight Bearing Restrictions: No      Mobility  Bed Mobility Overal bed mobility: Needs Assistance Bed Mobility: Supine to Sit     Supine to sit: Mod assist     General bed mobility comments: increased time, labored movement    Transfers Overall transfer level: Needs assistance Equipment used: Rolling walker (2 wheels) Transfers: Sit to/from Stand, Bed to chair/wheelchair/BSC Sit to Stand: Min assist, Mod assist   Step pivot transfers: Min assist, Mod assist       General transfer comment: unsteady labored movement    Ambulation/Gait Ambulation/Gait assistance: Mod assist Gait Distance (Feet): 8 Feet Assistive device: Rolling walker (2 wheels) Gait Pattern/deviations: Decreased step length - left, Decreased stance time - right, Decreased stride length, Knees buckling, Scissoring Gait velocity: slow     General Gait Details: limited to a few steps forward/backwards at bedside with occasional scissoring of legs and buckling of knees due to weakness  Stairs            Wheelchair Mobility     Tilt Bed    Modified Rankin (Stroke Patients Only)       Balance Overall balance assessment: Needs assistance Sitting-balance support: Feet supported, No upper extremity supported Sitting balance-Leahy Scale: Fair Sitting balance - Comments: fair/good seated at EOB   Standing balance support: Reliant on assistive device for balance, During functional activity, Bilateral upper extremity supported Standing balance-Leahy Scale: Poor Standing balance comment: fair/poor using RW  Pertinent Vitals/Pain Pain Assessment Pain Assessment: Faces Faces Pain Scale: Hurts little more Pain Location: movely to LUE, right distal arm Pain Descriptors / Indicators: Grimacing, Sore, Discomfort Pain Intervention(s): Limited activity within patient's tolerance, Monitored during  session, Repositioned    Home Living Family/patient expects to be discharged to:: Private residence Living Arrangements: Children Available Help at Discharge: Family;Available 24 hours/day Type of Home: House Home Access: Stairs to enter Entrance Stairs-Rails: None Entrance Stairs-Number of Steps: 1   Home Layout: Able to live on main level with bedroom/bathroom;Laundry or work area in [employer] Equipment: Gilmer Mor - single point;Wheelchair - manual;Grab bars - tub/shower;Shower seat;BSC/3in1;Rollator (4 wheels)      Prior Function Prior Level of Function : Needs assist       Physical Assist : Mobility (physical);ADLs (physical) Mobility (physical): Bed mobility;Transfers;Gait;Stairs   Mobility Comments: Household ambulation using Rollator ADLs Comments: Assisted by family     Extremity/Trunk Assessment   Upper Extremity Assessment Upper Extremity Assessment: Defer to OT evaluation    Lower Extremity Assessment Lower Extremity Assessment: Generalized weakness    Cervical / Trunk Assessment Cervical / Trunk Assessment: Normal  Communication   Communication Communication: No apparent difficulties  Cognition Arousal: Alert Behavior During Therapy: WFL for tasks assessed/performed Overall Cognitive Status: Within Functional Limits for tasks assessed                                          General Comments      Exercises     Assessment/Plan    PT Assessment Patient needs continued PT services  PT Problem List Decreased strength;Decreased activity tolerance;Decreased balance;Decreased mobility       PT Treatment Interventions DME instruction;Gait training;Stair training;Functional mobility training;Therapeutic activities;Therapeutic exercise;Balance training;Patient/family education    PT Goals (Current goals can be found in the Care Plan section)  Acute Rehab PT Goals Patient Stated Goal: return home with family to assist PT Goal  Formulation: With patient/family Time For Goal Achievement: 05/22/23 Potential to Achieve Goals: Good    Frequency Min 3X/week     Co-evaluation               AM-PAC PT "6 Clicks" Mobility  Outcome Measure Help needed turning from your back to your side while in a flat bed without using bedrails?: A Lot Help needed moving from lying on your back to sitting on the side of a flat bed without using bedrails?: A Lot Help needed moving to and from a bed to a chair (including a wheelchair)?: A Lot Help needed standing up from a chair using your arms (e.g., wheelchair or bedside chair)?: A Lot Help needed to walk in hospital room?: A Lot Help needed climbing 3-5 steps with a railing? : Total 6 Click Score: 11    End of Session Equipment Utilized During Treatment: Oxygen Activity Tolerance: Patient tolerated treatment well;Patient limited by fatigue Patient left: in chair;with call bell/phone within reach;with family/visitor present Nurse Communication: Mobility status PT Visit Diagnosis: Unsteadiness on feet (R26.81);Other abnormalities of gait and mobility (R26.89);Muscle weakness (generalized) (M62.81);Difficulty in walking, not elsewhere classified (R26.2)    Time: 3235-5732 PT Time Calculation (min) (ACUTE ONLY): 19 min   Charges:   PT Evaluation $PT Eval Low Complexity: 1 Low PT Treatments $Therapeutic Activity: 8-22 mins PT General Charges $$ ACUTE PT VISIT: 1 Visit         3:01 PM, 05/15/23  Ocie Bob, MPT Physical Therapist with [facility] [phone] office [phone] mobile phone

## 2023-05-15 NOTE — Progress Notes (Signed)
[facility] PROGRESS NOTE  Felicia Williams (DOB: 06-13-1941) ZOX:[id number] PCP: Rebekah Chesterfield, NP  Brief Narrative: Felicia Williams is an 82 y.o. 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      CT abdomen pelvis without contrast showed increased bilateral  pleural effusions with lower lobe consolidation when compared with the prior exam. Protonix was given, Reglan was given.  Lokelma due to hyperkalemia was also given. Hospitalist was asked to admit patient for further evaluation and management.  Subjective: Pt reports intermittent sharp pains in upper abdomen, sometimes worse with certain movements, but finds it difficult to describe. Not related strictly to exertion, not present currently. Breathing is better, though not at baseline, has been refusing some cares. Son and DIL at bedside, state something is off from her baseline mentally.   Objective: BP (!) 124/49 (BP Location: Left Leg)   Pulse 88   Temp 98 F (36.7 C)   Resp 20   Wt 39 kg   SpO2 97%   BMI 17.37 kg/m   Gen: Elderly frail female in no acute distress Pulm: Diminished at bases, no wheezes, there are diffuse crackles  CV: RRR, no MRG,  GI: Soft, NT, ND, +BS  Neuro: Alert and oriented, bradyphrenic. No new focal deficits. No asterixis. Ext: Warm, no deformities. No induration or fluctuance.   Assessment & Plan: Nausea, vomiting, abdominal pain: No acute findings on CT. Amylase, lipase wnl. May be attributable to doxycycline which we are stopping today.  - Continue reglan for gastroparesis (reports hx of this) - Continue prn zofran.  - Continue IV PPI - If not improving, may necessitate GI consultation - Limit narcotics (though these are chronic and cannot be  stopped)  Pleural effusions, pulmonary edema, acute on chronic combined HFrEF with demand myocardial ischemia: No chest pain currently, no acute ST elevations on ECG, no current chest pain. LVEF showed global hypokinesis with LVEF 35-40%, G1DD  - Continue monitoring clinically with serial dialysis hoping to take fluid off. If unable to liberate from oxygen, may need repeat thoracentesis - Defer cardiology evaluation to outpatient setting as long as no anginal symptoms appear.   ESRD:  - Nephrology following, had make up HD this morning (typically TTS), will have dialysis back on schedule 10/24.   Hyperkalemia:  - Has declined lokelma, reluctant to try any other agents either. It is possible that specimen collected this morning during dialysis has spurious results.  - Remain on telemetry - Attempt recheck (difficult stick) >> ADDENDUM, K is normal.   Acute encephalopathy: No focal deficits noted. ?Delirium vs. mild uremia.  - Monitor serially after repeat HD, will check B12, ammonia, negative RPR relatively recently. If continues, may need further work up, e.g. neuroimaging, rEEG.   HTN:  - Continue coreg (BP elevated as pt refused this as well, will give prn's as well), continue hydralazine. Also note measurements may be spurious due to arm restrictions and arterial disease.   Left sacral stage 2 pressure injury: POA.  - Offload, optimize nutrition.   History of infected AVG: s/p excision, placement of artegraft by Dr. Karin Lieu.  - Completes doxycycline today.   Tyrone Nine, MD [facility] [email or web address] 05/15/2023, 4:25 PM

## 2023-05-15 NOTE — Plan of Care (Signed)
  Problem: Education: Goal: Knowledge of General Education information will improve Description Including pain rating scale, medication(s)/side effects and non-pharmacologic comfort measures Outcome: Progressing   Problem: Health Behavior/Discharge Planning: Goal: Ability to manage health-related needs will improve Outcome: Progressing   

## 2023-05-15 NOTE — TOC Initial Note (Signed)
Transition of Care [facility]) - Initial/Assessment Note    Patient Details  Name: Felicia Williams MRN: [id number] Date of Birth: 07-May-1941  Transition of Care [facility]) CM/SW Contact:    Villa Herb, LCSWA Phone Number: 05/15/2023, 12:01 PM  Clinical Narrative:                 Pt is high risk for readmission. CSW spoke with pts son Kathlene November who is at bedside as pt was speaking with Attending MD. Pts son states he and his wife live with pt. Pt needs assistance with ADLs and family provides transportation when needed. Pt has HH PT currently with [facility], they will need resumption orders at D/C. Pt uses a walker in the home and uses a wheelchair when in the community. TOC to follow.   Expected Discharge Plan: Home w Home Health Services Barriers to Discharge: Continued Medical Work up   Patient Goals and CMS Choice Patient states their goals for this hospitalization and ongoing recovery are:: return home CMS Medicare.gov Compare Post Acute Care list provided to:: Patient Choice offered to / list presented to : Patient      Expected Discharge Plan and Services In-house Referral: Clinical Social Work Discharge Planning Services: CM Consult Post Acute Care Choice: Home Health Living arrangements for the past 2 months: Single Family Home                                      Prior Living Arrangements/Services Living arrangements for the past 2 months: Single Family Home Lives with:: Adult Children Patient language and need for interpreter reviewed:: Yes Do you feel safe going back to the place where you live?: Yes      Need for Family Participation in Patient Care: Yes (Comment) Care giver support system in place?: Yes (comment)   Criminal Activity/Legal Involvement Pertinent to Current Situation/Hospitalization: No - Comment as needed  Activities of Daily Living   ADL Screening (condition at time of admission) Independently performs ADLs?: No Does the patient have a  NEW difficulty with bathing/dressing/toileting/self-feeding that is expected to last >3 days?: Yes (Initiates electronic notice to provider for possible OT consult) Does the patient have a NEW difficulty with getting in/out of bed, walking, or climbing stairs that is expected to last >3 days?: No Does the patient have a NEW difficulty with communication that is expected to last >3 days?: No Is the patient deaf or have difficulty hearing?: No Does the patient have difficulty seeing, even when wearing glasses/contacts?: No Does the patient have difficulty concentrating, remembering, or making decisions?: No  Permission Sought/Granted                  Emotional Assessment Appearance:: Appears stated age Attitude/Demeanor/Rapport: Engaged Affect (typically observed): Accepting Orientation: : Oriented to Self, Oriented to Place, Oriented to  Time, Oriented to Situation Alcohol / Substance Use: Not Applicable Psych Involvement: No (comment)  Admission diagnosis:  Cellulitis of right lower extremity [L03.115] Pain of upper abdomen [R10.10] Acute on chronic combined systolic and diastolic CHF (congestive heart failure) (HCC) [I50.43] Systolic congestive heart failure, unspecified HF chronicity (HCC) [I50.20] Patient Active Problem List   Diagnosis Date Noted   Abdominal pain 05/14/2023   Elevated brain natriuretic peptide (BNP) level 05/14/2023   Nausea & vomiting 05/14/2023   Cellulitis of right lower extremity 05/13/2023   Chronic combined systolic and diastolic CHF (congestive heart failure) (HCC)  05/13/2023   Complication of AV dialysis fistula 04/30/2023   Pleural effusion due to CHF (congestive heart failure) (HCC) 04/30/2023   Heart failure with reduced ejection fraction (HCC) 04/30/2023   Chronic respiratory failure with hypoxia (HCC) 04/30/2023   Chronic pain 04/30/2023   History of anemia due to chronic kidney disease 02/11/2023   Lobar pneumonia (HCC) 02/02/2023   Sepsis  due to undetermined organism (HCC) 01/30/2023   CAP (community acquired pneumonia) 01/29/2023   Prolonged QT interval 01/29/2023   (HFpEF) heart failure with preserved ejection fraction (HCC) 01/08/2023   Acute hypoxic respiratory failure (HCC) 10/25/2022   Acute hypoxemic respiratory failure (HCC) 10/14/2022   Nausea and vomiting 10/14/2022   Abnormal stress test 08/16/2022   Chronic diarrhea 08/09/2022   GERD (gastroesophageal reflux disease) 07/12/2022   Hypertensive crisis 07/12/2022   Acute respiratory failure with hypoxia (HCC) 04/25/2022   Acute pulmonary edema (HCC)    Pressure injury of skin 04/23/2022   Volume overload 04/22/2022   Elevated troponin 04/22/2022   Hyperkalemia 02/28/2022   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   Anorexia 11/28/2021   Weight loss 11/28/2021   Malnutrition of moderate degree 09/10/2021   Generalized weakness 09/07/2021   Debility 09/07/2021   Change in bowel movement 09/06/2021   Acute metabolic encephalopathy 08/30/2021   Hypokalemia 08/30/2021   Pruritus 08/28/2021   End-stage renal disease on hemodialysis (HCC) 08/26/2021   Age-related osteoporosis without current pathological fracture 08/26/2021   Body mass index (BMI) 22.0-22.9, adult 08/26/2021   Iron deficiency anemia 08/26/2021   Localized, 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   Overactive bladder 08/26/2021   Sciatica 08/26/2021   Vitamin D deficiency 08/26/2021   Panic disorder 08/26/2021   Gastroparesis 05/23/2021   Chronic cough 05/23/2021   Renal failure 01/15/2019   ARF (acute renal failure) (HCC) 01/07/2019   Glomerulonephritis 01/07/2019   Psoriasis 01/07/2019   Tachycardia, unspecified 01/07/2019   Macrocytic anemia 08/18/2018   Proteinuria 09/04/2017   Essential hypertension, benign 01/26/2014   Fatigue due to depression 01/26/2014   Gout 07/30/2012   Mixed hyperlipidemia 10/01/2011   Depression 10/01/2011   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 [place] [address] Phone: [phone] Fax: [phone]     Social Determinants of Health (SDOH) Social History: SDOH Screenings   Food Insecurity: No Food Insecurity (05/14/2023)  Housing: Patient Declined (05/14/2023)  Transportation Needs: No Transportation Needs (05/14/2023)  Utilities: Not At Risk (05/14/2023)  Tobacco Use: Low Risk  (05/13/2023)   SDOH Interventions:     Readmission Risk Interventions    05/15/2023   12:00 PM 05/02/2023   12:37 PM 01/31/2023    9:54 AM  Readmission Risk Prevention Plan  Transportation Screening Complete Complete Complete  PCP or Specialist Appt within 3-5 Days  Complete   HRI or Home Care Consult Complete Complete Complete  Social Work Consult for Recovery Care Planning/Counseling Complete Complete Complete  Palliative Care Screening Not Applicable Not Applicable Not Applicable  Medication Review Oceanographer) Complete Referral to Pharmacy Complete

## 2023-05-15 NOTE — Progress Notes (Signed)
   05/15/23 1155  Spiritual Encounters  Type of Visit Initial  Care provided to: Pt not available  OnCall Visit No   Chaplain attempted to visit with patient during rounding on the floor. The patient Felicia Williams was sleepiing.  I will revisit another day.   Valerie Roys [facility]  [phone]

## 2023-05-15 NOTE — Plan of Care (Signed)
  Problem: Acute Rehab PT Goals(only PT should resolve) Goal: Pt Will Go Supine/Side To Sit Outcome: Progressing Flowsheets (Taken 05/15/2023 1504) Pt will go Supine/Side to Sit:  with contact guard assist  with minimal assist Goal: Patient Will Transfer Sit To/From Stand Outcome: Progressing Flowsheets (Taken 05/15/2023 1504) Patient will transfer sit to/from stand:  with contact guard assist  with minimal assist Goal: Pt Will Transfer Bed To Chair/Chair To Bed Outcome: Progressing Flowsheets (Taken 05/15/2023 1504) Pt will Transfer Bed to Chair/Chair to Bed:  with contact guard assist  with min assist Goal: Pt Will Ambulate Outcome: Progressing Flowsheets (Taken 05/15/2023 1504) Pt will Ambulate:  25 feet  with contact guard assist  with minimal assist  with rolling walker   3:05 PM, 05/15/23 Ocie Bob, MPT Physical Therapist with [facility] [phone] office [phone] mobile phone

## 2023-05-15 NOTE — Plan of Care (Signed)
  Problem: Clinical Measurements: Goal: Ability to maintain clinical measurements within normal limits will improve Outcome: Progressing Goal: Will remain free from infection Outcome: Progressing   Problem: Health Behavior/Discharge Planning: Goal: Ability to manage health-related needs will improve Outcome: Progressing   Problem: Activity: Goal: Risk for activity intolerance will decrease Outcome: Progressing   Problem: Coping: Goal: Level of anxiety will decrease Outcome: Progressing   Problem: Pain Managment: Goal: General experience of comfort will improve Outcome: Progressing

## 2023-05-16 DIAGNOSIS — Z515 Encounter for palliative care: Secondary | ICD-10-CM | POA: Diagnosis not present

## 2023-05-16 DIAGNOSIS — I5042 Chronic combined systolic (congestive) and diastolic (congestive) heart failure: Secondary | ICD-10-CM | POA: Diagnosis not present

## 2023-05-16 DIAGNOSIS — Z7189 Other specified counseling: Secondary | ICD-10-CM

## 2023-05-16 DIAGNOSIS — R1084 Generalized abdominal pain: Secondary | ICD-10-CM | POA: Diagnosis not present

## 2023-05-16 LAB — RENAL FUNCTION PANEL
Albumin: 2.2 g/dL — ABNORMAL LOW (ref 3.5–5.0)
Anion gap: 15 (ref 5–15)
BUN: 59 mg/dL — ABNORMAL HIGH (ref 8–23)
CO2: 24 mmol/L (ref 22–32)
Calcium: 7.8 mg/dL — ABNORMAL LOW (ref 8.9–10.3)
Chloride: 93 mmol/L — ABNORMAL LOW (ref 98–111)
Creatinine, Ser: 6.15 mg/dL — ABNORMAL HIGH (ref 0.44–1.00)
GFR, Estimated: 6 mL/min — ABNORMAL LOW (ref >60)
Glucose, Bld: 78 mg/dL (ref 70–99)
Phosphorus: 8.9 mg/dL — ABNORMAL HIGH (ref 2.5–4.6)
Potassium: 4.4 mmol/L (ref 3.5–5.1)
Sodium: 132 mmol/L — ABNORMAL LOW (ref 135–145)

## 2023-05-16 LAB — VITAMIN B12: Vitamin B-12: 969 pg/mL — ABNORMAL HIGH (ref 180–914)

## 2023-05-16 LAB — AMMONIA: Ammonia: 16 umol/L (ref 9–35)

## 2023-05-16 MED ORDER — GERHARDT'S BUTT CREAM
TOPICAL_CREAM | Freq: Three times a day (TID) | CUTANEOUS | Status: DC
  Filled 2023-05-16 (×2): qty 1

## 2023-05-16 NOTE — Plan of Care (Signed)
  Problem: Education: Goal: Knowledge of General Education information will improve Description: Including pain rating scale, medication(s)/side effects and non-pharmacologic comfort measures 05/16/2023 1624 by Genevie Ann, RN Outcome: Progressing 05/16/2023 1620 by Genevie Ann, RN Outcome: Progressing   Problem: Health Behavior/Discharge Planning: Goal: Ability to manage health-related needs will improve Outcome: Progressing

## 2023-05-16 NOTE — Progress Notes (Signed)
Mobility Specialist Progress Note:    05/16/23 1115  Mobility  Activity Stood at bedside  Level of Assistance Minimal assist, patient does 75% or more  Assistive Device Front wheel walker  Range of Motion/Exercises Active;All extremities  Activity Response Tolerated well  Mobility Referral Yes  $Mobility charge 1 Mobility  Mobility Specialist Start Time (ACUTE ONLY) 1115  Mobility Specialist Stop Time (ACUTE ONLY) 1130  Mobility Specialist Time Calculation (min) (ACUTE ONLY) 15 min   Pt received in bed, son in room. Agreeable to mobility, required MinA to stand with RW. Tolerated well, deferred ambulation d/t necessary peri care. NT notified, all needs met.   Lawerance Bach Mobility Specialist Please contact via Special educational needs teacher or  Rehab office at [phone]

## 2023-05-16 NOTE — Plan of Care (Signed)
  Problem: Clinical Measurements: Goal: Will remain free from infection Outcome: Progressing Goal: Diagnostic test results will improve Outcome: Progressing Goal: Respiratory complications will improve Outcome: Progressing   Problem: Activity: Goal: Risk for activity intolerance will decrease Outcome: Progressing   Problem: Nutrition: Goal: Adequate nutrition will be maintained Outcome: Progressing   

## 2023-05-16 NOTE — Progress Notes (Signed)
Physical Therapy Treatment Patient Details Name: Felicia Williams MRN: [id number] DOB: February 13, 1941 Today's Date: 05/16/2023   History of Present Illness Felicia Williams is an 82 y.o. 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  Abdominal pain was sharp, it was initially intermittent, then it progressed to being constant and was rated as 8-9/10 on pain scale.  She was unable to go to dialysis yesterday due to continuous nausea and vomiting.  She endorsed decreased appetite, last vomitus was yesterday in the morning (10/21).    PT Comments  Pt supine and willing to participate with therapy.  Pt presents with improved bed mobility and transfer training with only verbal cueing required for hand placement to increase ease.  Used RW for standing stability and gait training, able to increase distance with no LOB just limited by fatigue.  EOS pt left in chair with call bell within reach and chair alarm set.    If plan is discharge home, recommend the following:     Can travel by private vehicle        Equipment Recommendations       Recommendations for Other Services       Precautions / Restrictions Precautions Precautions: Fall     Mobility  Bed Mobility Overal bed mobility: Modified Independent Bed Mobility: Supine to Sit     Supine to sit: Supervision     General bed mobility comments: increased time, labored movement    Transfers Overall transfer level: Modified independent Equipment used: Rolling walker (2 wheels) Transfers: Sit to/from Stand Sit to Stand: Supervision, Min assist           General transfer comment: cueing for handplacement to increase ease with standing    Ambulation/Gait Ambulation/Gait assistance: Min assist Gait  Distance (Feet): 40 Feet Assistive device: Rolling walker (2 wheels) Gait Pattern/deviations: Decreased step length - left, Decreased stance time - right, Decreased stride length, Knees buckling Gait velocity: slow     General Gait Details: slow labored movements, increased cadence with no LOB.   Stairs             Wheelchair Mobility     Tilt Bed    Modified Rankin (Stroke Patients Only)       Balance                                            Cognition Arousal: Alert   Overall Cognitive Status: Within Functional Limits for tasks assessed                                          Exercises      General Comments        Pertinent Vitals/Pain Pain Assessment Pain Assessment: No/denies pain    Home Living                          Prior Function            PT Goals (current goals can now be found in the care plan section)      Frequency  PT Plan      Co-evaluation              AM-PAC PT "6 Clicks" Mobility   Outcome Measure  Help needed turning from your back to your side while in a flat bed without using bedrails?: A Little Help needed moving from lying on your back to sitting on the side of a flat bed without using bedrails?: A Little Help needed moving to and from a bed to a chair (including a wheelchair)?: A Little Help needed standing up from a chair using your arms (e.g., wheelchair or bedside chair)?: A Little Help needed to walk in hospital room?: A Little Help needed climbing 3-5 steps with a railing? : Total 6 Click Score: 16    End of Session Equipment Utilized During Treatment: Gait belt Activity Tolerance: Patient tolerated treatment well;Patient limited by fatigue Patient left: in chair;with call bell/phone within reach;with chair alarm set Nurse Communication: Mobility status PT Visit Diagnosis: Unsteadiness on feet (R26.81);Other abnormalities of gait and  mobility (R26.89);Muscle weakness (generalized) (M62.81);Difficulty in walking, not elsewhere classified (R26.2)     Time: 4098-1191 PT Time Calculation (min) (ACUTE ONLY): 20 min  Charges:    $Therapeutic Activity: 8-22 mins PT General Charges $$ ACUTE PT VISIT: 1 Visit                     Becky Sax, LPTA/CLT; CBIS [phone]  Juel Burrow 05/16/2023, 2:43 PM

## 2023-05-16 NOTE — Progress Notes (Signed)
Palliative:  Consult received. I went to visit with Ms. Dam and family at bedside. Both are soundly sleeping at this time. Neither awakened when I entered room. I did not awaken them for conversation as consultation is not urgent. I will check in again tomorrow.   No charge  Yong Channel, NP Palliative Medicine Team Pager [phone] (Please see [email or web address] for schedule) Team Phone [phone]

## 2023-05-16 NOTE — Progress Notes (Signed)
   05/16/23 1250  Spiritual Encounters  Type of Visit Initial  Care provided to: Pt and family  Referral source Other (comment) (Spitritual Consult)  Reason for visit Advance directives  OnCall Visit No   Chaplain responded to a spiritual consult for advanced directive education. The patient, Floridalma said she wanted to look over the paperwork at home and go from there. Family arrived and we said a prayer for Digna's health and support and I departed so they could enjoy time together.   Valerie Roys  [facility]  [phone]

## 2023-05-16 NOTE — Progress Notes (Signed)
[facility] Progress Note   Assessment/ Plan:   Dialysis Orders: Center: [name] MWF  3h  400/500   2K/2.5Ca bath  Heparin 1000 bolus + 1200u/hr  RIJ TDC - EDW 42kg IVPN with HD Micera 100 mcg q2 weeks (was due 05/13/23) Venofer 50 mg IV once per week (was due 05/13/23)   Assessment/Plan:  Abdominal pain/N/V/D - CT scan without obstruction or inflammatory changes to bowels.  Did have single gallstone and stable pancreatic cyst.  Will order lipase and amylase.  LFT's WNL except for low alb and tot protein.  Pain management per primary svc.  Possibly related to doxycycline vs gastroparesis.  Feeling better today and tolerated reglan well.  ESRD -  Had HD early tWednesday.  Next for tomorrow  Hypertension/volume  - has evidence of volume overload, however she is only 0.6kg above edw.  Will challenge edw given HTN and pulm edema/effusions.  Anemia  - Hgb 12.9, no indication for ESA  Metabolic bone disease -   continue with home meds  Nutrition - per primary svc  Combined systolic and diastolic CHF - as above UF as tolerated.  Bilateral pleural effusions - for thoracentesis tomorrow.  Hyperkalemia  - Had HD early this morning and K of 6.2 was before she was started on HD.  Recheck this morning.     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 Karin Lieu.  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  Amylase and lipase were WNL.     Subjective:    Seen in room.  Feeling somewhat better.     Objective:   BP (!) 145/55 (BP Location: Right Arm)   Pulse 80   Temp 98.5 F (36.9 C)   Resp 16   Wt 38.6 kg   SpO2 98%   BMI 17.19 kg/m   Physical Exam: Gen:NAD CVS: RRR Resp: clear Abd: soft Ext: no LE edema ACCESS: TDC and AVG + bruit with staples  Labs: BMET Recent Labs  Lab 05/13/23 1325 05/15/23 0215 05/15/23 0224 05/15/23 1311 05/15/23 1340 05/16/23 0506  NA 139  --  133* 130* 130*  132*  K 5.3*  --  6.2* 3.9 3.9 4.4  CL 98  --  94* 94* 92* 93*  CO2 23  --  17* 20* 24 24  GLUCOSE 109*  --  79 109* 90 78  BUN 116*  --  142* 47* 48* 59*  CREATININE 9.25*  --  10.29* 4.76* 4.85* 6.15*  CALCIUM 8.2*  --  7.5* 7.9* 7.9* 7.8*  PHOS  --  12.6*  --  6.8* 6.9* 8.9*   CBC Recent Labs  Lab 05/13/23 1325 05/15/23 0224  WBC 6.0 8.1  HGB 12.9 10.8*  HCT 40.7 33.7*  MCV 85.9 84.0  PLT 209 243      Medications:     carvedilol  12.5 mg Oral BID   Chlorhexidine Gluconate Cloth  6 each Topical Q0600   hydrALAZINE  50 mg Oral BID   pantoprazole (PROTONIX) IV  40 mg Intravenous Q24H     Bufford Buttner, MD 05/16/2023, 11:29 AM

## 2023-05-16 NOTE — Progress Notes (Signed)
[facility] PROGRESS NOTE  Felicia Williams (DOB: [birth year]) WJX:[id number] PCP: Rebekah Chesterfield, NP  Brief Narrative: Felicia Williams is an 82 y.o. 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      CT abdomen pelvis without contrast showed increased bilateral  pleural effusions with lower lobe consolidation when compared with the prior exam. Protonix was given, Reglan was given.  Lokelma due to hyperkalemia was also given. Hospitalist was asked to admit patient for further evaluation and management.  Subjective: Pt reports intermittent sharp pains in upper abdomen, sometimes worse with certain movements, but finds it difficult to describe. Not related strictly to exertion, not present currently. Breathing is better, though not at baseline, has been refusing some cares. Son and DIL at bedside, state something is off from her baseline mentally.   Objective: BP 115/65   Pulse 66   Temp (!) 97.4 F (36.3 C) (Axillary)   Resp 16   Wt 38.6 kg   SpO2 97%   BMI 17.19 kg/m   Gen: Elderly frail female in no acute distress Pulm: Diminished at bases, no wheezes, there are diffuse crackles  CV: RRR, no MRG,  GI: Soft, NT, ND, +BS  Neuro: Alert and oriented, bradyphrenic. No new focal deficits. No asterixis. Ext: Warm, no deformities. No induration or fluctuance.   Assessment & Plan: Nausea, vomiting, abdominal pain: No acute findings on CT. Amylase, lipase wnl. May be attributable to doxycycline, has improved since stopping. - Continue reglan for gastroparesis (reports hx of this) - Continue prn zofran.  - Continue IV PPI  - Limit narcotics (though these are chronic and cannot be stopped)  Chronic hypoxic respiratory failure, pleural effusions,  pulmonary edema, acute on chronic combined HFrEF with demand myocardial ischemia: No chest pain currently, no acute ST elevations on ECG, no current chest pain. LVEF showed global hypokinesis with LVEF 35-40%, G1DD  - Continue monitoring clinically with serial dialysis hoping to take fluid off. Consider repeat thoracentesis, though this may not be necessary. Thora 10/9 showed no malignant cells, +Eos, lymphs, PMNs.  - Defer cardiology evaluation to outpatient setting as long as no anginal symptoms appear.   ESRD:  - Nephrology following, had make up HD this morning (typically TTS), will have dialysis back on schedule 10/25  Hyperkalemia:  - Has declined lokelma, reluctant to try any other agents either. It is possible that specimen collected this morning during dialysis has spurious results.  - Remain on telemetry - Attempt recheck (difficult stick) >> ADDENDUM, K is normal.   Acute encephalopathy: No focal deficits noted. ?Delirium vs. mild uremia. B12 replete, ammonia normal. No focal deficits. - Has improved, so will defer neuroimaging, rEEG, etc.   HTN:  - Continue coreg, hydralazine. Also note measurements may be spurious due to arm restrictions and arterial disease.   Left sacral stage 2 pressure injury: POA.  - Offload, optimize nutrition.   History of infected AVG: s/p excision, placement of artegraft by Dr. Karin Lieu.  - Completed doxycycline 10/23.  Tyrone Nine, MD [facility] [email or web address] 05/16/2023, 3:33 PM

## 2023-05-16 NOTE — Plan of Care (Signed)
  Problem: Education: Goal: Knowledge of General Education information will improve Description Including pain rating scale, medication(s)/side effects and non-pharmacologic comfort measures Outcome: Progressing   

## 2023-05-16 NOTE — Plan of Care (Signed)
  Problem: Education: Goal: Knowledge of General Education information will improve Description: Including pain rating scale, medication(s)/side effects and non-pharmacologic comfort measures 05/16/2023 1625 by Genevie Ann, RN Outcome: Progressing 05/16/2023 1624 by Genevie Ann, RN Outcome: Progressing 05/16/2023 1620 by Genevie Ann, RN Outcome: Progressing   Problem: Health Behavior/Discharge Planning: Goal: Ability to manage health-related needs will improve 05/16/2023 1625 by Genevie Ann, RN Outcome: Progressing 05/16/2023 1624 by Genevie Ann, RN Outcome: Progressing

## 2023-05-17 ENCOUNTER — Inpatient Hospital Stay (HOSPITAL_COMMUNITY): Payer: Medicare Other

## 2023-05-17 DIAGNOSIS — Z992 Dependence on renal dialysis: Secondary | ICD-10-CM | POA: Diagnosis not present

## 2023-05-17 DIAGNOSIS — N186 End stage renal disease: Secondary | ICD-10-CM | POA: Diagnosis not present

## 2023-05-17 DIAGNOSIS — R197 Diarrhea, unspecified: Secondary | ICD-10-CM

## 2023-05-17 DIAGNOSIS — Z7189 Other specified counseling: Secondary | ICD-10-CM | POA: Diagnosis not present

## 2023-05-17 DIAGNOSIS — R634 Abnormal weight loss: Secondary | ICD-10-CM | POA: Diagnosis not present

## 2023-05-17 DIAGNOSIS — R1084 Generalized abdominal pain: Secondary | ICD-10-CM | POA: Diagnosis not present

## 2023-05-17 DIAGNOSIS — R1010 Upper abdominal pain, unspecified: Secondary | ICD-10-CM | POA: Diagnosis not present

## 2023-05-17 DIAGNOSIS — R112 Nausea with vomiting, unspecified: Secondary | ICD-10-CM | POA: Diagnosis not present

## 2023-05-17 DIAGNOSIS — Z515 Encounter for palliative care: Secondary | ICD-10-CM | POA: Diagnosis not present

## 2023-05-17 LAB — GLUCOSE, PLEURAL OR PERITONEAL FLUID: Glucose, Fluid: 103 mg/dL

## 2023-05-17 LAB — BODY FLUID CELL COUNT WITH DIFFERENTIAL
Eos, Fluid: 6 %
Lymphs, Fluid: 63 %
Monocyte-Macrophage-Serous Fluid: 31 % — ABNORMAL LOW (ref 50–90)
Neutrophil Count, Fluid: 0 % (ref 0–25)
Total Nucleated Cell Count, Fluid: 116 uL (ref 0–1000)

## 2023-05-17 LAB — RENAL FUNCTION PANEL
Albumin: 2.1 g/dL — ABNORMAL LOW (ref 3.5–5.0)
Anion gap: 16 — ABNORMAL HIGH (ref 5–15)
BUN: 73 mg/dL — ABNORMAL HIGH (ref 8–23)
CO2: 21 mmol/L — ABNORMAL LOW (ref 22–32)
Calcium: 8 mg/dL — ABNORMAL LOW (ref 8.9–10.3)
Chloride: 95 mmol/L — ABNORMAL LOW (ref 98–111)
Creatinine, Ser: 7.27 mg/dL — ABNORMAL HIGH (ref 0.44–1.00)
GFR, Estimated: 5 mL/min — ABNORMAL LOW (ref >60)
Glucose, Bld: 75 mg/dL (ref 70–99)
Phosphorus: 9.7 mg/dL — ABNORMAL HIGH (ref 2.5–4.6)
Potassium: 4.8 mmol/L (ref 3.5–5.1)
Sodium: 132 mmol/L — ABNORMAL LOW (ref 135–145)

## 2023-05-17 LAB — ALBUMIN, PLEURAL OR PERITONEAL FLUID: Albumin, Fluid: <1.5 g/dL

## 2023-05-17 LAB — CBC
HCT: 34.1 % — ABNORMAL LOW (ref 36.0–46.0)
Hemoglobin: 10.5 g/dL — ABNORMAL LOW (ref 12.0–15.0)
MCH: 26.9 pg (ref 26.0–34.0)
MCHC: 30.8 g/dL (ref 30.0–36.0)
MCV: 87.4 fL (ref 80.0–100.0)
Platelets: 158 10*3/uL (ref 150–400)
RBC: 3.9 MIL/uL (ref 3.87–5.11)
RDW: 17.9 % — ABNORMAL HIGH (ref 11.5–15.5)
WBC: 4.9 10*3/uL (ref 4.0–10.5)
nRBC: 0 % (ref 0.0–0.2)

## 2023-05-17 LAB — GRAM STAIN: Gram Stain: NONE SEEN

## 2023-05-17 LAB — PROTEIN, PLEURAL OR PERITONEAL FLUID: Total protein, fluid: <3 g/dL

## 2023-05-17 MED ORDER — HEPARIN SODIUM (PORCINE) 1000 UNIT/ML DIALYSIS
1000.0000 [IU] | INTRAMUSCULAR | Status: DC | PRN
  Administered 2023-05-17 – 2023-05-20 (×2): 3800 [IU]

## 2023-05-17 MED ORDER — METOCLOPRAMIDE HCL 5 MG/ML IJ SOLN: 5.0000 mg | Freq: Three times a day (TID) | INTRAMUSCULAR | Status: DC | PRN

## 2023-05-17 MED ORDER — LIDOCAINE HCL (PF) 2 % IJ SOLN
INTRAMUSCULAR | Status: AC
  Filled 2023-05-17: qty 10

## 2023-05-17 MED ORDER — HEPARIN SODIUM (PORCINE) 1000 UNIT/ML IJ SOLN
INTRAMUSCULAR | Status: AC
  Filled 2023-05-17: qty 4

## 2023-05-17 MED ORDER — ANTICOAGULANT SODIUM CITRATE 4% (200MG/5ML) IV SOLN: 5.0000 mL | Status: DC | PRN

## 2023-05-17 MED ORDER — HEPARIN SODIUM (PORCINE) 1000 UNIT/ML IJ SOLN
2000.0000 [IU] | Freq: Once | INTRAMUSCULAR | Status: AC
  Administered 2023-05-17: 2000 [IU] via INTRAVENOUS

## 2023-05-17 MED ORDER — IOHEXOL 350 MG/ML SOLN
80.0000 mL | Freq: Once | INTRAVENOUS | Status: AC | PRN
  Administered 2023-05-17: 80 mL via INTRAVENOUS

## 2023-05-17 MED ORDER — ALTEPLASE 2 MG IJ SOLR: 2.0000 mg | Freq: Once | INTRAMUSCULAR | Status: DC | PRN

## 2023-05-17 NOTE — Progress Notes (Signed)
[facility] PROGRESS NOTE  Neeli Whitling (DOB: Oct 17, 1940) NWG:[id number] PCP: Rebekah Chesterfield, NP  Brief Narrative: Felicia Williams is an 82 y.o. 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    CT abdomen pelvis without contrast showed increased bilateral  pleural effusions with lower lobe consolidation when compared with the prior exam. Protonix was given, Reglan was given.  Lokelma due to hyperkalemia was also given. Hospitalist was asked to admit patient for further evaluation and management.  She was admitted, doxycycline course has been completed, gastroparesis treated with reglan though GI symptoms continue. GI consult requested. Pleural effusion was drained 10/25 without significant change in respiratory status.   Subjective: Son at bedside, she and he report she's had more heaves today any time she tries to take in oral intake. She had some loose stools last night and 4 episodes today as well, abdomen is not comfortable but not really painful either. No dyspnea. Didn't have dyspnea before thora and no change with it this morning.   Objective: BP (!) 151/41 (BP Location: Right Leg)   Pulse 92   Temp 97.8 F (36.6 C) (Oral)   Resp 18   Wt 40.7 kg   SpO2 94%   BMI 18.12 kg/m   Gen: Nontoxic elderly alert female in no distress Pulm: Clear with improved aeration at R base  CV: RRR, no MRG, trace edema dependently.  GI: Soft, diffusely uncomfortable without rebound or guarding, +BS Neuro: Alert and oriented. No new focal deficits. Ext: Warm, no deformities. Skin: No new rashes, lesions or ulcers on visualized skin   Assessment & Plan: Nausea, vomiting, abdominal pain: No acute findings on CT. Amylase, lipase wnl.  - May be attributable to  doxycycline, thought to be improving after stopped, though symptoms recurrent, limiting ability to discharge. GI consulted for further recommendations. Their assistance is greatly appreciated.  - Continue reglan for gastroparesis (reports hx of this) - Continue prn zofran.  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 LVEF showed global hypokinesis with LVEF 35-40%, G1DD  - Continue monitoring clinically with serial dialysis hoping to take fluid off. Repeat thoracentesis this admission 10/25 consistent with transudate, negative gram stain, will follow culture and cytology. This did not have significant impact on patient's subjective respiratory status nor oxygenation.  - Defer cardiology evaluation to outpatient setting as long as no anginal symptoms appear.   ESRD:  - Nephrology following, will have dialysis back on schedule today 10/25  Hyperkalemia: Resolved, Tx with dialysis.  Acute encephalopathy: No focal deficits noted. ?Delirium vs. mild uremia. B12 replete, ammonia normal. No focal deficits. - Has improved, so will defer neuroimaging, rEEG, etc.   HTN:  - Continue coreg, hydralazine. Also note measurements may be spurious due to arm restrictions and arterial disease.   Left sacral stage 2 pressure injury: POA.  - Offload, optimize nutrition.   History of infected AVG: s/p excision, placement of artegraft by Dr. Karin Lieu.  - Completed doxycycline 10/23.  DNR: Status confirmed with palliative consultation with patient on 10/25. Gold form in chart.   Fulton Reek  Jarvis Newcomer, MD [facility] [email or web address] 05/17/2023, 2:25 PM

## 2023-05-17 NOTE — Progress Notes (Signed)
[facility] Progress Note   Assessment/ Plan:   Dialysis Orders: Center: [name] MWF  3h  400/500   2K/2.5Ca bath  Heparin 1000 bolus + 1200u/hr  RIJ TDC - EDW 42kg IVPN with HD Micera 100 mcg q2 weeks (was due 05/13/23) Venofer 50 mg IV once per week (was due 05/13/23)   Assessment/Plan:  Abdominal pain/N/V/D - CT scan without obstruction or inflammatory changes to bowels.  Did have single gallstone and stable pancreatic cyst.  Will order lipase and amylase.  LFT's WNL except for low alb and tot protein.  Pain management per primary svc.  Possibly related to doxycycline vs gastroparesis.    ESRD -  Had HD early tWednesday.  Next for today  Hypertension/volume  - has evidence of volume overload, however she is only 0.6kg above edw.  Will challenge edw given HTN and pulm edema/effusions.  Anemia  - Hgb 12.9, no indication for ESA  Metabolic bone disease -   continue with home meds  Nutrition - per primary svc  Combined systolic and diastolic CHF - as above UF as tolerated.  Bilateral pleural effusions - for thoracentesis tomorrow.  Hyperkalemia  - Had HD early this morning and K of 6.2 was before she was started on HD.  Recheck this morning.     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 Karin Lieu.  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  Amylase and lipase were WNL.     Subjective:    Seen in room.  Feeling somewhat better.     Objective:   BP (!) 151/41 (BP Location: Right Leg)   Pulse 92   Temp 97.8 F (36.6 C) (Oral)   Resp 18   Wt 40.7 kg   SpO2 94%   BMI 18.12 kg/m   Physical Exam: Gen:NAD CVS: RRR Resp: clear Abd: soft Ext: no LE edema ACCESS: TDC and AVG + bruit with staples  Labs: BMET Recent Labs  Lab 05/13/23 1325 05/15/23 0215 05/15/23 0224 05/15/23 1311 05/15/23 1340 05/16/23 0506 05/17/23 0417  NA 139  --  133* 130* 130* 132* 132*  K 5.3*  --  6.2*  3.9 3.9 4.4 4.8  CL 98  --  94* 94* 92* 93* 95*  CO2 23  --  17* 20* 24 24 21*  GLUCOSE 109*  --  79 109* 90 78 75  BUN 116*  --  142* 47* 48* 59* 73*  CREATININE 9.25*  --  10.29* 4.76* 4.85* 6.15* 7.27*  CALCIUM 8.2*  --  7.5* 7.9* 7.9* 7.8* 8.0*  PHOS  --  12.6*  --  6.8* 6.9* 8.9* 9.7*   CBC Recent Labs  Lab 05/13/23 1325 05/15/23 0224 05/17/23 0417  WBC 6.0 8.1 4.9  HGB 12.9 10.8* 10.5*  HCT 40.7 33.7* 34.1*  MCV 85.9 84.0 87.4  PLT 209 243 158      Medications:     carvedilol  12.5 mg Oral BID   Chlorhexidine Gluconate Cloth  6 each Topical Q0600   Gerhardt's butt cream   Topical TID   hydrALAZINE  50 mg Oral BID   pantoprazole (PROTONIX) IV  40 mg Intravenous Q24H     Bufford Buttner, MD 05/17/2023, 2:31 PM

## 2023-05-17 NOTE — Progress Notes (Signed)
Palliative:  HPI:  82 y.o. female  with past medical history of ESRD on HD MWF, HTN, combined systolic and diastolic CHF, chronic 2L oxygen, gastroparesis, gout admitted on 05/13/2023 with abd pain, nausea, vomiting, diarrhea thought related to doxycycline vs gastroparesis.   I met today with Felicia Williams and her son, Felicia Williams, at bedside. Felicia Williams brought Felicia Williams glasses and we reviewed the use of Advance Directive and discuss decisions such as resuscitation. Felicia Williams shares that she doesn't think she would want to be resuscitated and would not want to be "a vegetable." She acknowledges that her health is changing and declining and she is becoming more fatigued. Her health is becoming more challenging and effecting her quality of life. Felicia Williams reassures her that he will support any decisions and wishes she expresses. Felicia Williams steps out of the room so that Felicia Williams can go through Advance Directive with me as he does not want to influence her decisions.   I reviewed with Felicia Williams. She elects HCPOA 1) Daughter Pam 2) Son Felicia Williams. No desire for organ donation. She initials that she does NOT desire to be prolonged in the three scenarios of terminal illness, comatose state, and advanced dementia. She does NOT desire to be prolonged with artificial nutrition or hydration. She elects to follow her documented wishes. I spoke further with Felicia Williams and she elects DNR status. She does not believe she would ever want a feeding tube. She wishes at this time to continue to optimize her health and quality of life. We did discuss that it may be helpful to review MOST form in the near future.   All questions/concerns addressed. Emotional support provided.   Exam: Alert, oriented. No distress. Breathing regular, unlabored. Abd soft - diarrhea improved today. Moves all extremities.   Plan: - DNR decided - Advance Directive prepared - consulted chaplain to assist to notarize/witness for completion  60 min  Yong Channel, NP Palliative Medicine Team Pager [phone] (Please see [email or web address] for schedule) Team Phone [phone]    Greater than 50%  of this time was spent counseling and coordinating care related to the above assessment and plan

## 2023-05-17 NOTE — Plan of Care (Signed)
  Problem: Clinical Measurements: Goal: Respiratory complications will improve Outcome: Progressing Goal: Cardiovascular complication will be avoided Outcome: Progressing   Problem: Activity: Goal: Risk for activity intolerance will decrease Outcome: Progressing   Problem: Nutrition: Goal: Adequate nutrition will be maintained Outcome: Not Progressing   Problem: Coping: Goal: Level of anxiety will decrease Outcome: Progressing   Problem: Pain Managment: Goal: General experience of comfort will improve Outcome: Progressing

## 2023-05-17 NOTE — Evaluation (Signed)
Occupational Therapy Evaluation Patient Details Name: Felicia Williams MRN: [id number] DOB: 1940-08-18 Today's Date: 05/17/2023   History of Present Illness Felicia Williams is an 82 y.o. 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  Abdominal pain was sharp, it was initially intermittent, then it progressed to being constant and was rated as 8-9/10 on pain scale.  She was unable to go to dialysis yesterday due to continuous nausea and vomiting.  She endorsed decreased appetite, last vomitus was yesterday in the morning (10/21).   Clinical Impression   Pt agreeable to OT evaluation. Pt up and moving about the room and completing groom as this therapist was entering the room. Pt's family present and providing assist. Pt is generally weak in B UE with R UE shoulder deficit at baseline. Pt is at level of supervision for mobility and ADL's per clinical judgement. Family reported interest in home health services for strengthening. Pt left in the bed with family present. Pt is not recommended for further acute OT services and will be discharged to care of nursing staff for remaining length of stay.          If plan is discharge home, recommend the following: A little help with walking and/or transfers;A little help with bathing/dressing/bathroom;Assist for transportation;Assistance with cooking/housework    Functional Status Assessment  Patient has had a recent decline in their functional status and demonstrates the ability to make significant improvements in function in a reasonable and predictable amount of time.  Equipment Recommendations  None recommended by OT    Recommendations for Other Services       Precautions / Restrictions Precautions Precautions:  Fall Precaution Comments: on 2L O2 baseline Restrictions Weight Bearing Restrictions: No      Mobility Bed Mobility               General bed mobility comments: Pt standing at the start of session.    Transfers Overall transfer level: Needs assistance Equipment used: Rolling walker (2 wheels) Transfers: Bed to chair/wheelchair/BSC       Step pivot transfers: Supervision            Balance Overall balance assessment: Needs assistance Sitting-balance support: Feet supported, No upper extremity supported Sitting balance-Leahy Scale: Good Sitting balance - Comments: seated at EOB   Standing balance support: Bilateral upper extremity supported, During functional activity Standing balance-Leahy Scale: Fair Standing balance comment: fair to good                           ADL either performed or assessed with clinical judgement   ADL Overall ADL's : Needs assistance/impaired     Grooming: Supervision/safety;Standing Grooming Details (indicate cue type and reason): Pt observed to stand and brush teeth with family at start of the session.             Lower Body Dressing: Supervision/safety;Sitting/lateral leans   Toilet Transfer: Contact guard assist;Supervision/safety;Rolling walker (2 wheels) Toilet Transfer Details (indicate cue type and reason): Simulated via ambulation from standing at the sink to walking to bed. Toileting- Clothing Manipulation and Hygiene: Set up;Supervision/safety;Sitting/lateral lean       Functional mobility during ADLs: Supervision/safety;Rolling walker (2 wheels)       Vision Baseline Vision/History: 1 Wears glasses Ability to See in  Adequate Light: 1 Impaired Patient Visual Report: No change from baseline Vision Assessment?: No apparent visual deficits;Wears glasses for reading     Perception Perception: Not tested       Praxis Praxis: Not tested       Pertinent Vitals/Pain Pain Assessment Pain  Assessment: No/denies pain     Extremity/Trunk Assessment Upper Extremity Assessment Upper Extremity Assessment: Generalized weakness;RUE deficits/detail;Right hand dominant RUE Deficits / Details: 3-/5 due to previous injury. Generally weak otherwise.   Lower Extremity Assessment Lower Extremity Assessment: Defer to PT evaluation   Cervical / Trunk Assessment Cervical / Trunk Assessment: Normal   Communication Communication Communication: No apparent difficulties   Cognition Arousal: Alert Behavior During Therapy: WFL for tasks assessed/performed Overall Cognitive Status: Within Functional Limits for tasks assessed                                                        Home Living Family/patient expects to be discharged to:: Private residence Living Arrangements: Children Available Help at Discharge: Family;Available 24 hours/day Type of Home: House Home Access: Stairs to enter [employer] of Steps: 1 Entrance Stairs-Rails: None Home Layout: Able to live on main level with bedroom/bathroom;Laundry or work area in basement     [employer] Shower/Tub: Chief Strategy Officer: Standard Bathroom Accessibility: Yes   Home Equipment: Gilmer Mor - single point;Wheelchair - manual;Grab bars - tub/shower;Shower seat;BSC/3in1;Rollator (4 wheels)   Additional Comments: per PT note      Prior Functioning/Environment Prior Level of Function : Needs assist       Physical Assist : ADLs (physical)   ADLs (physical): IADLs Mobility Comments: Household ambulation using Rollator ADLs Comments: Indepenent ADL; assist for IADL's.         End of Session Equipment Utilized During Treatment: Rolling walker (2 wheels);Oxygen  Activity Tolerance: Patient tolerated treatment well Patient left: in bed;with family/visitor present  OT Visit Diagnosis: Unsteadiness on feet (R26.81);Other abnormalities of gait and mobility (R26.89);Muscle weakness  (generalized) (M62.81)                Time: 1610-9604 OT Time Calculation (min): 7 min Charges:  OT General Charges $OT Visit: 1 Visit OT Evaluation $OT Eval Low Complexity: 1 Low  Emma Schupp OT, MOT   Danie Chandler 05/17/2023, 9:56 AM

## 2023-05-17 NOTE — Progress Notes (Signed)
PT Cancellation Note  Patient Details Name: Felicia Williams MRN: [id number] DOB: [birth year]   Cancelled Treatment:     PT is in procedure  Virgina Organ, PT CLT [phone]  05/17/2023, 10:57 AM

## 2023-05-17 NOTE — Progress Notes (Signed)
Pt transported to [place] 1 per stretcher from unit, in no obvious distress. No SOB, no dyspnea. Ambulated to BR with assist, on RA, sat remained at 94% throughout. Placed back on 2L [place]. Procedure explained, consent obtained. Placed in upright position at bedside. Prepped and draped with sterile technique. Local anesthetic admin without adverse event. Access obtained at R post thorax. Clear serous fluid aspirated for lab. Connected to vacutainer suction. 750cc total retrieved. Access removed without difficulty. Bandage applied, no bleeding, no hematoma. Transported from [place] back to unit.

## 2023-05-17 NOTE — Procedures (Signed)
HD Note:  Some information was entered later than the data was gathered due to patient care needs. The stated time with the data is accurate.  Received patient in bed to unit.   Alert and oriented.   Informed consent signed and in chart.   Access used: Right upper chest HD catheter Access issues: No issues.  Patient complained of back pain. She stated it was an 8/10. Patient's nurse brought medication up to the dialysis unit for her.  See [facility]  Patient stated that the pain did not change with interventions.  Patient's BP did not stay sufficiently high enough to support the UF goal of 2000-2500 ml.  See flowsheet for details.  Patient was asymptomatic with lower BP.  TX duration: 3.25 hours  Alert, without acute distress.  Total UF removed: 1500 ml  Hand-off given to patient's nurse.   Transported back to the room   Laira Penninger L. Dareen Piano, RN [facility].

## 2023-05-17 NOTE — Consult Note (Signed)
Gastroenterology Consult   Referring Provider: No ref. provider found Primary Care Physician:  Rebekah Chesterfield, NP Primary Gastroenterologist:  Dr. Levon Hedger  Patient ID: Felicia Williams; [id number]; 1941/01/19   Admit date: 05/13/2023  LOS: 4 days   Date of Consultation: 05/17/2023  Reason for Consultation:  abdominal pain, nausea, vomiting  History of Present Illness   Felicia Williams is a 82 y.o. 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 GI consulted given ongoing N/V, abdominal pain and diarrhea not resolved by discontinuation of antibiotics.   This admission: CT A/P without contrast: Increased bilateral pleural effusions with lower lobe consolidation when compared to prior.  No obstruction or inflammatory changes noted within the colon.  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 Started on Protonix and given Reglan.  Received Lokelma for hyperkalemia.   Consult:  Patient states for about the last week she has had worsening abdominal pain, nausea, vomiting, and diarrhea.  Yesterday she had 4 looser stools and has had 2 today so far.  Patient and son report the patient has had significant decrease in appetite as well as early satiety.  He notes that her weight is around 89 pounds recently.  She reports receiving Reglan this admission which exacerbated her diarrhea.  She notably was on doxycycline recently.  She denies any melena or BRBPR.  She takes iron intravenously with dialysis as needed.  She reports that at home she has been nauseous but also vomited a couple of times and was unable to find her Zofran and was in need of a refill.  Recent  history of thoracentesis on 10/9, cardiology following along.  Recently received doxycycline and diarrhea initially thought to be secondary to this.  Last seen in the office 02/21/2023.  Patient was reportedly at [facility] rehab.  She denied any nausea but reported intermittent vomiting that comes out of nowhere when eating.  It happened about once weekly for the prior 4 weeks.  Reportedly supposed be getting Zofran every morning but was having to remind staff for this.  She states she usually tends to do well with Zofran.  She was also reporting odynophagia with pain rating down her esophagus to her upper abdomen with eating.  Denied typical heartburn symptoms, dysphagia was unclear.  Describes some lower esophageal globus sensation.  Reported some worsening chronic diarrhea since her last hospitalization having about 7 bowel movements per day when in the hospital with nocturnal stools.  She was somewhat improved but having to take Imodium several days per week.  Stated a 35 pound weight loss since February 2023.  Receives IV iron at dialysis.  Scheduled for outpatient upper endoscopy.  Advised PPI twice daily and Zofran every morning.  Check stool studies and continue Imodium as needed.  Prior endoscopic workup: EGD 11/16/2020:  -Grade A esophagitis -widely patent Schatzki's ring at [employer] junctio -5 cm hiatal hernia, few small sessile polyps in the gastric body biopsied.   -Pathology with fundic gland polyps, no H. pylori.    Last colonoscopy 12/28/2015: Normal exam.    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 Karilyn Cota.   GERD (gastroesophageal reflux disease)    Glomerulonephritis    Gout     Mixed hyperlipidemia    Spondylosis     Past Surgical History:  Procedure Laterality Date   APPENDECTOMY     AV FISTULA PLACEMENT Left 03/20/2022   Procedure: INSERTION OF LEFT UPPER ARM ARTERIOVENOUS (AV) GORE-TEX GRAFT;  Surgeon: Larina Earthly, MD;  Location: [facility];  Service: Vascular;  Laterality: Left;   CATARACT EXTRACTION Bilateral    COLONOSCOPY N/A 12/28/2015   Procedure: COLONOSCOPY;  Surgeon: Malissa Hippo, MD;  Location: [facility];  Service: Endoscopy;  Laterality: N/A;  815   ESOPHAGOGASTRODUODENOSCOPY N/A 11/16/2020   Procedure: ESOPHAGOGASTRODUODENOSCOPY (EGD);  Surgeon: Malissa Hippo, MD;  Location: [facility];  Service: Endoscopy;  Laterality: N/A;  1:15   EXCHANGE OF A DIALYSIS CATHETER N/A 03/20/2022   Procedure: EXCHANGE OF A DIALYSIS CATHETER;  Surgeon: Larina Earthly, MD;  Location: [facility];  Service: Vascular;  Laterality: N/A;   INSERTION OF DIALYSIS CATHETER Right 09/30/2021   Procedure: INSERTION OF TUNNELED DIALYSIS CATHETER;  Surgeon: Maeola Harman, MD;  Location: [facility] OR;  Service: Vascular;  Laterality: Right;   IR FLUORO GUIDE CV LINE RIGHT  01/16/2019   IR REMOVAL TUN CV CATH W/O FL  07/01/2019   IR THORACENTESIS ASP PLEURAL SPACE W/IMG GUIDE  05/01/2023   IR THROMBECTOMY AV FISTULA W/THROMBOLYSIS/PTA INC/SHUNT/IMG LEFT Left 02/01/2023   IR US GUIDE VASC ACCESS LEFT  02/01/2023   IR US GUIDE VASC ACCESS RIGHT  01/16/2019   POLYPECTOMY  11/16/2020   Procedure: POLYPECTOMY;  Surgeon: Malissa Hippo, MD;  Location: [facility];  Service: Endoscopy;;  gastric   REMOVAL OF A DIALYSIS CATHETER  09/30/2021   Procedure: REMOVAL OF A PERITONEAL DIALYSIS CATHETER;  Surgeon: Maeola Harman, MD;  Location[facility] OR;  Service: Vascular;;   REMOVAL OF A DIALYSIS CATHETER N/A 05/22/2022   Procedure: MINOR REMOVAL OF A TUNNELED DIALYSIS CATHETER;  Surgeon: Larina Earthly, MD;  Location: [facility];  Service: Vascular;  Laterality: N/A;    REVISON OF ARTERIOVENOUS FISTULA Left 04/30/2023   Procedure: LEFT ARM FISTULA REVISION, INTERPOSITIONAL ARTEGRAFT;  Surgeon: Victorino Sparrow, MD;  Location: [facility] OR;  Service: Vascular;  Laterality: Left;   TUBAL LIGATION      Prior to Admission medications   Medication Sig Start Date End Date Taking? Authorizing Provider  carvedilol (COREG) 12.5 MG tablet Take 1 tablet by mouth 2 (two) times daily.   Yes [provider]  hydrALAZINE (APRESOLINE) 50 MG tablet Take 50 mg by mouth 2 (two) times daily. Do not take morning dose on Dialysis days. Monday,Wednesday and Friday   Yes [provider]  nitroGLYCERIN (NITROSTAT) 0.4 MG SL tablet Place 0.4 mg under the tongue every 5 (five) minutes as needed for chest pain. 12/28/22  Yes [provider]  oxyCODONE (OXY IR/ROXICODONE) 5 MG immediate release tablet Take 1 tablet (5 mg total) by mouth every 6 (six) hours as needed. 05/02/23  Yes Emilie Rutter, PA-C  traZODone (DESYREL) 100 MG tablet Take 50 mg by mouth at bedtime.   Yes [provider]    Current Facility-Administered Medications  Medication Dose Route Frequency Provider Last Rate Last Admin   acetaminophen (TYLENOL) tablet 650 mg  650 mg Oral Q6H PRN Adefeso, Oladapo, DO   325 mg at 05/15/23 0046   Or   acetaminophen (TYLENOL) suppository 650 mg  650 mg Rectal Q6H PRN Adefeso, Oladapo, DO       alteplase (CATHFLO ACTIVASE) injection 2 mg  2 mg Intracatheter Once PRN Terrial Rhodes, MD       anticoagulant sodium citrate solution 5 mL  5 mL Intracatheter PRN Terrial Rhodes, MD       carvedilol (COREG) tablet 12.5 mg  12.5 mg Oral BID Adefeso, Oladapo, DO   12.5 mg at 05/17/23 1610   Chlorhexidine Gluconate Cloth 2 % PADS 6 each  6 each Topical Q0600 Terrial Rhodes, MD   6 each at 05/17/23 9604   Gerhardt's butt cream   Topical TID Tyrone Nine, MD   Given at 05/17/23 0932   heparin injection 1,000 Units  1,000 Units Intracatheter PRN  Terrial Rhodes, MD       hydrALAZINE (APRESOLINE) injection 10 mg  10 mg Intravenous Q4H PRN Tyrone Nine, MD   10 mg at 05/15/23 1254   hydrALAZINE (APRESOLINE) tablet 50 mg  50 mg Oral BID Adefeso, Oladapo, DO   50 mg at 05/16/23 2219   HYDROmorphone (DILAUDID) injection 0.5 mg  0.5 mg Intravenous Q4H PRN Adefeso, Oladapo, DO   0.5 mg at 05/15/23 2156   ondansetron (ZOFRAN) tablet 4 mg  4 mg Oral Q6H PRN Frankey Shown, DO       Or   ondansetron (ZOFRAN) injection 4 mg  4 mg Intravenous Q6H PRN Adefeso, Oladapo, DO   4 mg at 05/16/23 1744   oxyCODONE (Oxy IR/ROXICODONE) immediate release tablet 5 mg  5 mg Oral Q6H PRN Adefeso, Oladapo, DO   5 mg at 05/16/23 1744   pantoprazole (PROTONIX) injection 40 mg  40 mg Intravenous Q24H Kathlen Mody, MD   40 mg at 05/15/23 1641   traZODone (DESYREL) tablet 50 mg  50 mg Oral QHS PRN Kathlen Mody, MD        Allergies as of 05/13/2023 - Review Complete 04/30/2023  Allergen Reaction Noted   Cefepime Other (See Comments) 09/01/2021   Morphine Other (See Comments) 09/28/2021   Sulfa antibiotics Other (See Comments) 01/11/2014    Family History  Problem Relation Age of Onset   CAD Father    Heart attack Father    Diabetes Mellitus II Father    Hypertension Father    Lupus Brother     Social History   Socioeconomic History   Marital status: Single    Spouse name: Not on file   Number of children: Not on file   Years of education: Not on file   Highest education level: Not on file  Occupational History   Not on file  Tobacco Use   Smoking status: Never    Passive exposure: Never   Smokeless tobacco: Never  Vaping Use   Vaping status: Never Used  Substance and Sexual Activity   Alcohol use: No   Drug use: No   Sexual activity: Not Currently  Other Topics Concern   Not on file  Social History Narrative   Not on file   Social Determinants of Health   Financial Resource Strain: Not on file  Food Insecurity: No Food  Insecurity (05/14/2023)   Hunger Vital Sign    Worried About Running Out of Food in the Last  Year: Never true    Ran Out of Food in the Last Year: Never true  Transportation Needs: No Transportation Needs (05/14/2023)   PRAPARE - Administrator, Civil Service (Medical): No    Lack of Transportation (Non-Medical): No  Physical Activity: Not on file  Stress: Not on file  Social Connections: Not on file  Intimate Partner Violence: Not At Risk (05/14/2023)   Humiliation, Afraid, Rape, and Kick questionnaire    Fear of Current or Ex-Partner: No    Emotionally Abused: No    Physically Abused: No    Sexually Abused: No     Review of Systems   Gen: + Weight loss and lack of appetite.  Denies any fever, chills CV: Denies chest pain, heart palpitations, syncope, edema  Resp: Denies shortness of breath with rest, cough, wheezing, coughing up blood, and pleurisy. GI: see HPI GU : Denies urinary burning, blood in urine, urinary frequency, and urinary incontinence. MS: Denies joint pain, limitation of movement, swelling, cramps, and atrophy.  Derm: Denies rash, itching, dry skin, hives. Psych: Denies depression, anxiety, memory loss, hallucinations, and confusion. Heme: Denies bruising or bleeding Neuro:  Denies any headaches, dizziness, paresthesias, shaking  Physical Exam   Vital Signs in last 24 hours: Temp:  [97.4 F (36.3 C)-98.2 F (36.8 C)] 98.2 F (36.8 C) (10/25 0707) Pulse Rate:  [64-67] 64 (10/25 0707) Resp:  [16] 16 (10/25 0707) BP: (115-176)/(45-65) 176/45 (10/25 0707) SpO2:  [97 %-100 %] 99 % (10/25 0707) Weight:  [40.7 kg] 40.7 kg (10/25 0500) Last BM Date : 05/16/23  General:   Alert,  thin, pleasant and cooperative in NAD Head:  Normocephalic and atraumatic. Eyes:  Sclera clear, no icterus.   Conjunctiva pink. Ears:  Normal auditory acuity. Mouth:  No deformity or lesions, dentition normal. Neck:  Supple; no masses Abdomen:  Soft, flat,  non-distended. No masses, hepatosplenomegaly or hernias noted. Normal bowel sounds, without guarding, and without rebound.   Rectal: deferred Msk:  Symmetrical without gross deformities. Normal posture. Extremities:  Without clubbing or edema. Neurologic:  Alert and  oriented x4 Skin:  Intact without significant lesions or rashes. Psych:  Alert and cooperative. Normal mood and affect.  Intake/Output from previous day: No intake/output data recorded. Intake/Output this shift: Total I/O In: 240 [P.O.:240] Out: -   Wt Readings from Last 3 Encounters:  05/17/23 40.7 kg  05/01/23 43 kg  04/25/23 43.1 kg   Labs/Studies   Recent Labs Recent Labs    05/15/23 0224 05/17/23 0417  WBC 8.1 4.9  HGB 10.8* 10.5*  HCT 33.7* 34.1*  PLT 243 158   BMET Recent Labs    05/15/23 1340 05/16/23 0506 05/17/23 0417  NA 130* 132* 132*  K 3.9 4.4 4.8  CL 92* 93* 95*  CO2 24 24 21*  GLUCOSE 90 78 75  BUN 48* 59* 73*  CREATININE 4.85* 6.15* 7.27*  CALCIUM 7.9* 7.8* 8.0*   LFT Recent Labs    05/15/23 0224 05/15/23 1311 05/15/23 1340 05/16/23 0506 05/17/23 0417  PROT 6.2*  --   --   --   --   ALBUMIN 2.3*   < > 2.3* 2.2* 2.1*  AST 15  --   --   --   --   ALT 9  --   --   --   --   ALKPHOS 82  --   --   --   --   BILITOT 0.6  --   --   --   --    < > =  values in this interval not displayed.   PT/INR No results for input(s): "LABPROT", "INR" in the last 72 hours. Hepatitis Panel No results for input(s): "HEPBSAG", "HCVAB", "HEPAIGM", "HEPBIGM" in the last 72 hours. C-Diff No results for input(s): "CDIFFTOX" in the last 72 hours.  Radiology/Studies DG Chest 1 View  Result Date: 05/17/2023 CLINICAL DATA:  Status post right thoracentesis. EXAM: CHEST  1 VIEW COMPARISON:  Chest x-ray dated May 13, 2023. FINDINGS: Unchanged tunneled right internal jugular dialysis catheter. Stable cardiomegaly and mild diffuse interstitial thickening. Resolved right pleural effusion after  thoracentesis. No pneumothorax. Improved aeration of the right lung base. Unchanged small left pleural effusion and left basilar atelectasis. No acute osseous abnormality. IMPRESSION: 1. Resolved right pleural effusion after thoracentesis. No pneumothorax. 2. Unchanged small left pleural effusion and left basilar atelectasis. Electronically Signed   By: Obie Dredge M.D.   On: 05/17/2023 12:32   US THORACENTESIS ASP PLEURAL SPACE W/IMG GUIDE  Result Date: 05/17/2023 INDICATION: Pleural effusion.  Previous left thoracentesis 2 weeks ago. EXAM: ULTRASOUND GUIDED DIAGNOSTIC AND THERAPEUTIC RIGHT THORACENTESIS MEDICATIONS: 10 mL 1% lidocaine. COMPLICATIONS: None immediate. PROCEDURE: An ultrasound guided thoracentesis was thoroughly discussed with the patient and questions answered. The benefits, risks, alternatives and complications were also discussed. The patient understands and wishes to proceed with the procedure. Written consent was obtained. Ultrasound was performed to localize and mark an adequate pocket of fluid in the right chest. The area was then prepped and draped in the normal sterile fashion. 1% Lidocaine was used for local anesthesia. Under ultrasound guidance a 6 Fr Safe-T-Centesis catheter was introduced. Thoracentesis was performed. The catheter was removed and a dressing applied. FINDINGS: A total of approximately 750 mL of clear yellow fluid was removed. Samples were sent to the laboratory as requested by the clinical team. IMPRESSION: Successful ultrasound guided right thoracentesis yielding 750 mL of pleural fluid. Electronically Signed   By: Obie Dredge M.D.   On: 05/17/2023 10:44     Assessment   Felicia Williams is a 82 y.o. 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 GI consulted given ongoing N/V, abdominal pain and diarrhea not resolved by discontinuation of antibiotics.   Abdominal pain, N/V, diarrhea weight loss: Patient with history of gastroparesis, intermittent vomiting, and diarrhea.  Last seen in our office in August with ongoing weight loss and odynophagia especially with meals.  Also with some mild worsening diarrhea.  Last EGD in 2022 with grade a esophagitis, Schatzki's ring, and gastric polyps.  Last colonoscopy in 2017 with normal exam.  She has experienced a significant amount of weight loss since February 2023.  Given her weight loss, nausea, vomiting, lack of appetite, and early satiety unable to rule out possible mesenteric ischemia.  Usually fairly well-controlled with Zofran as needed and Imodium as needed for diarrhea.  Will keep PPI twice daily and check stool studies as well as fecal elastase to assess for infectious or pancreatic cause of chronic diarrhea.  Diarrhea associated abdominal pain usually to occur postprandially.  Recent imaging with no findings of chronic pancreatitis or gallbladder etiology.  Previously recommended outpatient EGD for upper GI symptoms, may consider this while inpatient pending CTA results.  Plan / Recommendations   Stool studies,  fecal elastase Trend H/H, transfuse for hgb <7 PPI BID Zofran as needed for N/V If no qtc prolongation could consider scheduling zofran or compazine CTA prior to dialysis today to assess for mesenteric ischemia Possible EGD pending CTA results     05/17/2023, 1:02 PM  Brooke Bonito, MSN, FNP-BC, AGACNP-BC [facility]

## 2023-05-18 DIAGNOSIS — K551 Chronic vascular disorders of intestine: Secondary | ICD-10-CM | POA: Diagnosis not present

## 2023-05-18 DIAGNOSIS — R197 Diarrhea, unspecified: Secondary | ICD-10-CM | POA: Diagnosis not present

## 2023-05-18 DIAGNOSIS — R1310 Dysphagia, unspecified: Secondary | ICD-10-CM

## 2023-05-18 DIAGNOSIS — R1319 Other dysphagia: Secondary | ICD-10-CM

## 2023-05-18 DIAGNOSIS — R634 Abnormal weight loss: Secondary | ICD-10-CM | POA: Diagnosis not present

## 2023-05-18 DIAGNOSIS — R1013 Epigastric pain: Secondary | ICD-10-CM | POA: Diagnosis not present

## 2023-05-18 DIAGNOSIS — N186 End stage renal disease: Secondary | ICD-10-CM | POA: Diagnosis not present

## 2023-05-18 DIAGNOSIS — I5042 Chronic combined systolic (congestive) and diastolic (congestive) heart failure: Secondary | ICD-10-CM | POA: Diagnosis not present

## 2023-05-18 LAB — RENAL FUNCTION PANEL
Albumin: 2.1 g/dL — ABNORMAL LOW (ref 3.5–5.0)
Anion gap: 12 (ref 5–15)
BUN: 29 mg/dL — ABNORMAL HIGH (ref 8–23)
CO2: 26 mmol/L (ref 22–32)
Calcium: 7.7 mg/dL — ABNORMAL LOW (ref 8.9–10.3)
Chloride: 94 mmol/L — ABNORMAL LOW (ref 98–111)
Creatinine, Ser: 4.13 mg/dL — ABNORMAL HIGH (ref 0.44–1.00)
GFR, Estimated: 10 mL/min — ABNORMAL LOW (ref >60)
Glucose, Bld: 101 mg/dL — ABNORMAL HIGH (ref 70–99)
Phosphorus: 5.7 mg/dL — ABNORMAL HIGH (ref 2.5–4.6)
Potassium: 3.9 mmol/L (ref 3.5–5.1)
Sodium: 132 mmol/L — ABNORMAL LOW (ref 135–145)

## 2023-05-18 MED ORDER — LORAZEPAM 2 MG/ML IJ SOLN
1.0000 mg | Freq: Once | INTRAMUSCULAR | Status: AC
  Administered 2023-05-18: 1 mg via INTRAVENOUS
  Filled 2023-05-18: qty 1

## 2023-05-18 NOTE — Progress Notes (Signed)
[facility] PROGRESS NOTE  Felicia Williams (DOB: [birth year]) ZDG:[id number] PCP: Rebekah Chesterfield, NP  Brief Narrative: Felicia Williams is an 82 y.o. 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    CT abdomen pelvis without contrast showed increased bilateral  pleural effusions with lower lobe consolidation when compared with the prior exam. Protonix was given, Reglan was given.  Lokelma due to hyperkalemia was also given. Hospitalist was asked to admit patient for further evaluation and management.  She was admitted, doxycycline course has been completed, gastroparesis treated with reglan though GI symptoms continue. GI consult requested. Pleural effusion was drained 10/25 without significant change in respiratory status.   Subjective: RN reports pt was anxious after HD yesterday evening, so got ativan and is somnolent this morning. She awakens but is frustrated to be awoken and has no complaints.   Objective: BP (!) 155/57 (BP Location: Left Leg)   Pulse 73   Temp 98.1 F (36.7 C) (Oral)   Resp 17   Wt 40.4 kg   SpO2 96%   BMI 17.99 kg/m   Gen: Frail elderly female in no distress Pulm: Clear anterolaterally, nonlabored  CV: RRR, no MRG GI: Soft, NT, ND, +BS Neuro: Limited exam, rouses.   Assessment & Plan: Nausea, vomiting, abdominal pain: No acute findings on CT. Amylase, lipase wnl.  - Some symptoms attributed to doxycycline, though, still remained after discontinuation. 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 IR follow up recommended for what could be culprit lesion (SMA) for chronic mesenteric ischemia.  - D/w GI this morning, to further r/o conditions in DDx, planning EGD  10/27.  - Continue reglan for gastroparesis (reports hx of this) - Continue prn zofran.  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 LVEF showed global hypokinesis with LVEF 35-40%, G1DD  - Continue monitoring clinically with serial dialysis hoping to take fluid off. Repeat thoracentesis this admission 10/25 consistent with transudate, negative gram stain, will follow culture and cytology. This did not have significant impact on patient's subjective respiratory status nor oxygenation.  - Defer cardiology evaluation to outpatient setting as long as no anginal symptoms appear.   ESRD:  - Nephrology following, will have dialysis back on schedule today 10/25  Hyperkalemia: Resolved, Tx with dialysis.  Acute encephalopathy: No focal deficits noted. ?Delirium vs. mild uremia. B12 replete, ammonia normal. No focal deficits. - Has improved, so will defer neuroimaging, rEEG, etc.  - Prefer lowest possible dose of medications, e.g. ativan 1mg  caused long lasting sedation.  HTN:  - Continue coreg, hydralazine.   Left sacral stage 2 pressure injury: POA.  - Offload, optimize nutrition.   History of infected AVG: s/p excision, placement of artegraft by Dr. Karin Lieu.  - Completed doxycycline 10/23. Staples in place though appears to be healing well. If still admitted 10/31, would let [facility] know as she has follow up that day, may remove staples at that time.   DNR: Status confirmed with palliative consultation with patient on 10/25. Gold form in chart.   Tyrone Nine,  MD [facility] [email or web address] 05/18/2023, 3:01 PM

## 2023-05-18 NOTE — H&P (View-Only) (Signed)
Subjective: Patient somnolent on interview and exam today.  Apparently was agitated earlier this morning receiving Ativan.  Her son Kathlene November is bedside provides majority of history today.  Objective: Vital signs in last 24 hours: Temp:  [97.4 F (36.3 C)-98.4 F (36.9 C)] 97.9 F (36.6 C) (10/26 0337) Pulse Rate:  [61-92] 66 (10/26 0337) Resp:  [8-30] 20 (10/26 0337) BP: (91-158)/(29-70) 140/46 (10/26 0337) SpO2:  [91 %-100 %] 91 % (10/26 0337) Weight:  [40.4 kg] 40.4 kg (10/26 0500) Last BM Date : 05/16/23 General:   Somnolent Head:  Normocephalic and atraumatic. Eyes:  No icterus, sclera clear. Conjuctiva pink.  Abdomen:  Bowel sounds present, soft, non-tender, non-distended. No HSM or hernias noted. No rebound or guarding. No masses appreciated  Msk:  Symmetrical without gross deformities. Normal posture. Extremities:  Without clubbing or edema. Neurologic:  Alert and  oriented x4;  grossly normal neurologically. Skin:  Warm and dry, intact without significant lesions.  Cervical Nodes:  No significant cervical adenopathy. Psych:  Alert and cooperative. Normal mood and affect.  Intake/Output from previous day: 10/25 0701 - 10/26 0700 In: 360 [P.O.:360] Out: 1500  Intake/Output this shift: No intake/output data recorded.  Lab Results: Recent Labs    05/17/23 0417  WBC 4.9  HGB 10.5*  HCT 34.1*  PLT 158   BMET Recent Labs    05/16/23 0506 05/17/23 0417 05/18/23 0533  NA 132* 132* 132*  K 4.4 4.8 3.9  CL 93* 95* 94*  CO2 24 21* 26  GLUCOSE 78 75 101*  BUN 59* 73* 29*  CREATININE 6.15* 7.27* 4.13*  CALCIUM 7.8* 8.0* 7.7*   LFT Recent Labs    05/16/23 0506 05/17/23 0417 05/18/23 0533  ALBUMIN 2.2* 2.1* 2.1*   PT/INR No results for input(s): "LABPROT", "INR" in the last 72 hours. Hepatitis Panel No results for input(s): "HEPBSAG", "HCVAB", "HEPAIGM", "HEPBIGM" in the last 72 hours.   Studies/Results: CT Angio Abd/Pel w/ and/or w/o  Result Date:  05/18/2023 CLINICAL DATA:  Epigastric pain, weight loss, nausea and vomiting EXAM: CTA ABDOMEN AND PELVIS WITHOUT AND WITH CONTRAST TECHNIQUE: Multidetector CT imaging of the abdomen and pelvis was performed using the standard protocol during bolus administration of intravenous contrast. Multiplanar reconstructed images and MIPs were obtained and reviewed to evaluate the vascular anatomy. RADIATION DOSE REDUCTION: This exam was performed according to the departmental dose-optimization program which includes automated exposure control, adjustment of the mA and/or kV according to patient size and/or use of iterative reconstruction technique. CONTRAST:  80mL OMNIPAQUE IOHEXOL 350 MG/ML SOLN COMPARISON:  Prior CT scan of the abdomen and pelvis 05/13/2023; MRCP 12/14/2021 FINDINGS: VASCULAR Aorta: Normal caliber aorta without aneurysm, dissection, vasculitis or significant stenosis. Extensive atherosclerotic vascular calcifications. 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 The right hepatic artery is replaced to the SMA. SMA: Focal calcified atherosclerotic plaque in the proximal SMA results in moderate to high-grade stenosis. The right hepatic artery is replaced to the SMA. Renals: There are 2 right-sided renal arteries and single left-sided renal artery. All 3 renal arteries are small in caliber with extensive atherosclerotic plaque and likely significant stenoses. The native kidneys are atrophic. IMA: Patent without evidence of aneurysm, dissection, vasculitis or significant stenosis. Inflow: Patent without evidence of aneurysm, dissection, vasculitis or significant stenosis. Proximal Outflow: Bilateral common femoral and visualized portions of the superficial and profunda femoral arteries are patent without evidence of aneurysm, dissection, vasculitis or significant  stenosis. Veins: No focal venous abnormality. Review of the  MIP images confirms the above findings. NON-VASCULAR Lower chest: Mild cardiomegaly. Moderate left and small right layering pleural effusions. Dependent atelectasis in both lower lobes. Mild patchy consolidative airspace opacities in a peribronchovascular distribution in the right lower lobe suggests mild aspiration. Hepatobiliary: No focal liver abnormality is seen. No gallstones, gallbladder wall thickening, or biliary dilatation. Pancreas: Stable 1.7 cm circumscribed low-attenuation lesion at the junction of the pancreatic body and tail. No significant interval change compared to prior imaging from 12/14/2021. Confirmed is a simple cyst on prior MRI imaging. Spleen: Normal in size without focal abnormality. Adrenals/Urinary Tract: Normal adrenal glands. Atrophied native kidneys. Stable appearance of circumscribed water attenuation cystic lesions in the kidneys consistent with simple cysts. No imaging follow-up is recommended. Ureters and bladder are unremarkable. Stomach/Bowel: No focal bowel wall thickening or evidence of obstruction. Lymphatic: No suspicious lymphadenopathy. Reproductive: Low-attenuation fluid within the endometrial canal in the uterine fundus as previously seen. No adnexal mass.Stable ovoid soft tissue density present at the posterolateral margin of the vulva measures approximately 2.9 x 1.9 cm. This is essentially unchanged dating back to August of 2023. This may represent a complex proteinaceous Bartholin's gland cyst. Given 1 year of stability, it is unlikely to be of clinical significance. Other: Severe pelvic floor laxity. Musculoskeletal: No acute fracture or aggressive appearing lytic or blastic osseous lesion. IMPRESSION: VASCULAR 1. 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 2. 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 3. Extensive atherosclerotic  calcifications throughout the abdominal aorta without aneurysm or dissection. 4. Heavily diseased and atrophied bilateral renal arteries. 5. Mild cardiomegaly. NON-VASCULAR 1. No acute abnormality within the abdomen or pelvis. 2. Patchy airspace opacities in the right lower lobe are favored to reflect small volume aspiration changes. 3. Small right and moderate left layering pleural effusions. 4. Advanced pelvic floor laxity. 5. Numerous chronic findings as enumerated above without significant interval change compared to prior imaging. Aortic Atherosclerosis (ICD10-I70.0). Signed, Sterling Big, MD, RPVI [facility] Electronically Signed   By: Malachy Moan M.D.   On: 05/18/2023 06:32   DG Chest 1 View  Result Date: 05/17/2023 CLINICAL DATA:  Status post right thoracentesis. EXAM: CHEST  1 VIEW COMPARISON:  Chest x-ray dated May 13, 2023. FINDINGS: Unchanged tunneled right internal jugular dialysis catheter. Stable cardiomegaly and mild diffuse interstitial thickening. Resolved right pleural effusion after thoracentesis. No pneumothorax. Improved aeration of the right lung base. Unchanged small left pleural effusion and left basilar atelectasis. No acute osseous abnormality. IMPRESSION: 1. Resolved right pleural effusion after thoracentesis. No pneumothorax. 2. Unchanged small left pleural effusion and left basilar atelectasis. Electronically Signed   By: Obie Dredge M.D.   On: 05/17/2023 12:32   US THORACENTESIS ASP PLEURAL SPACE W/IMG GUIDE  Result Date: 05/17/2023 INDICATION: Pleural effusion.  Previous left thoracentesis 2 weeks ago. EXAM: ULTRASOUND GUIDED DIAGNOSTIC AND THERAPEUTIC RIGHT THORACENTESIS MEDICATIONS: 10 mL 1% lidocaine. COMPLICATIONS: None immediate. PROCEDURE: An ultrasound guided thoracentesis was thoroughly discussed with the patient and questions answered. The benefits, risks, alternatives and complications  were also discussed. The patient understands and wishes to proceed with the procedure. Written consent was obtained. Ultrasound was performed to localize and mark an adequate pocket of fluid in the right chest. The area was then prepped and draped in the normal sterile fashion. 1% Lidocaine was used  for local anesthesia. Under ultrasound guidance a 6 Fr Safe-T-Centesis catheter was introduced. Thoracentesis was performed. The catheter was removed and a dressing applied. FINDINGS: A total of approximately 750 mL of clear yellow fluid was removed. Samples were sent to the laboratory as requested by the clinical team. IMPRESSION: Successful ultrasound guided right thoracentesis yielding 750 mL of pleural fluid. Electronically Signed   By: Obie Dredge M.D.   On: 05/17/2023 10:44    Assessment: *Abdominal pain-chronic *Nausea and vomiting *Diarrhea *Weight loss *Esophageal dysphagia *SMA stenosis  Plan: Patient quite somnolent on exam today.  History taken from with her son Kathlene November who is bedside.  I discussed CT angiogram with radiologist Dr. 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  Nothing acute that needs immediate intervention.  Recommended outpatient follow-up with IR.  Upon talking to the son further, patient does appear to have significant esophageal dysphagia.  He reports that she often feels like food gets stuck in her substernal region with frequent regurgitation.  Will plan on EGD tomorrow to evaluate for peptic ulcer disease, esophagitis, gastritis, H. Pylori, duodenitis, or other. Will also evaluate for esophageal stricture, Schatzki's ring, esophageal web or other.   The risks including infection, bleed, or perforation as well as benefits, limitations, alternatives and imponderables have been reviewed with the patient. Potential for esophageal dilation, biopsy, etc. have also been reviewed.  Questions have been  answered. All parties agreeable.  Continue PPI twice daily.  Continue monitor H&H and transfuse for less than 7.  Further recommendations after upper endoscopy.   Hennie Duos. Marletta Lor, D.O. Gastroenterology and Hepatology [facility]   LOS: 5 days    05/18/2023, 12:32 PM

## 2023-05-18 NOTE — Progress Notes (Signed)
Subjective: Patient somnolent on interview and exam today.  Apparently was agitated earlier this morning receiving Ativan.  Her son Felicia Williams is bedside provides majority of history today.  Objective: Vital signs in last 24 hours: Temp:  [97.4 F (36.3 C)-98.4 F (36.9 C)] 97.9 F (36.6 C) (10/26 0337) Pulse Rate:  [61-92] 66 (10/26 0337) Resp:  [8-30] 20 (10/26 0337) BP: (91-158)/(29-70) 140/46 (10/26 0337) SpO2:  [91 %-100 %] 91 % (10/26 0337) Weight:  [40.4 kg] 40.4 kg (10/26 0500) Last BM Date : 05/16/23 General:   Somnolent Head:  Normocephalic and atraumatic. Eyes:  No icterus, sclera clear. Conjuctiva pink.  Abdomen:  Bowel sounds present, soft, non-tender, non-distended. No HSM or hernias noted. No rebound or guarding. No masses appreciated  Msk:  Symmetrical without gross deformities. Normal posture. Extremities:  Without clubbing or edema. Neurologic:  Alert and  oriented x4;  grossly normal neurologically. Skin:  Warm and dry, intact without significant lesions.  Cervical Nodes:  No significant cervical adenopathy. Psych:  Alert and cooperative. Normal mood and affect.  Intake/Output from previous day: 10/25 0701 - 10/26 0700 In: 360 [P.O.:360] Out: 1500  Intake/Output this shift: No intake/output data recorded.  Lab Results: Recent Labs    05/17/23 0417  WBC 4.9  HGB 10.5*  HCT 34.1*  PLT 158   BMET Recent Labs    05/16/23 0506 05/17/23 0417 05/18/23 0533  NA 132* 132* 132*  K 4.4 4.8 3.9  CL 93* 95* 94*  CO2 24 21* 26  GLUCOSE 78 75 101*  BUN 59* 73* 29*  CREATININE 6.15* 7.27* 4.13*  CALCIUM 7.8* 8.0* 7.7*   LFT Recent Labs    05/16/23 0506 05/17/23 0417 05/18/23 0533  ALBUMIN 2.2* 2.1* 2.1*   PT/INR No results for input(s): "LABPROT", "INR" in the last 72 hours. Hepatitis Panel No results for input(s): "HEPBSAG", "HCVAB", "HEPAIGM", "HEPBIGM" in the last 72 hours.   Studies/Results: CT Angio Abd/Pel w/ and/or w/o  Result Date:  05/18/2023 CLINICAL DATA:  Epigastric pain, weight loss, nausea and vomiting EXAM: CTA ABDOMEN AND PELVIS WITHOUT AND WITH CONTRAST TECHNIQUE: Multidetector CT imaging of the abdomen and pelvis was performed using the standard protocol during bolus administration of intravenous contrast. Multiplanar reconstructed images and MIPs were obtained and reviewed to evaluate the vascular anatomy. RADIATION DOSE REDUCTION: This exam was performed according to the departmental dose-optimization program which includes automated exposure control, adjustment of the mA and/or kV according to patient size and/or use of iterative reconstruction technique. CONTRAST:  80mL OMNIPAQUE IOHEXOL 350 MG/ML SOLN COMPARISON:  Prior CT scan of the abdomen and pelvis 05/13/2023; MRCP 12/14/2021 FINDINGS: VASCULAR Aorta: Normal caliber aorta without aneurysm, dissection, vasculitis or significant stenosis. Extensive atherosclerotic vascular calcifications. 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 The right hepatic artery is replaced to the SMA. SMA: Focal calcified atherosclerotic plaque in the proximal SMA results in moderate to high-grade stenosis. The right hepatic artery is replaced to the SMA. Renals: There are 2 right-sided renal arteries and single left-sided renal artery. All 3 renal arteries are small in caliber with extensive atherosclerotic plaque and likely significant stenoses. The native kidneys are atrophic. IMA: Patent without evidence of aneurysm, dissection, vasculitis or significant stenosis. Inflow: Patent without evidence of aneurysm, dissection, vasculitis or significant stenosis. Proximal Outflow: Bilateral common femoral and visualized portions of the superficial and profunda femoral arteries are patent without evidence of aneurysm, dissection, vasculitis or significant  stenosis. Veins: No focal venous abnormality. Review of the  MIP images confirms the above findings. NON-VASCULAR Lower chest: Mild cardiomegaly. Moderate left and small right layering pleural effusions. Dependent atelectasis in both lower lobes. Mild patchy consolidative airspace opacities in a peribronchovascular distribution in the right lower lobe suggests mild aspiration. Hepatobiliary: No focal liver abnormality is seen. No gallstones, gallbladder wall thickening, or biliary dilatation. Pancreas: Stable 1.7 cm circumscribed low-attenuation lesion at the junction of the pancreatic body and tail. No significant interval change compared to prior imaging from 12/14/2021. Confirmed is a simple cyst on prior MRI imaging. Spleen: Normal in size without focal abnormality. Adrenals/Urinary Tract: Normal adrenal glands. Atrophied native kidneys. Stable appearance of circumscribed water attenuation cystic lesions in the kidneys consistent with simple cysts. No imaging follow-up is recommended. Ureters and bladder are unremarkable. Stomach/Bowel: No focal bowel wall thickening or evidence of obstruction. Lymphatic: No suspicious lymphadenopathy. Reproductive: Low-attenuation fluid within the endometrial canal in the uterine fundus as previously seen. No adnexal mass.Stable ovoid soft tissue density present at the posterolateral margin of the vulva measures approximately 2.9 x 1.9 cm. This is essentially unchanged dating back to August of 2023. This may represent a complex proteinaceous Bartholin's gland cyst. Given 1 year of stability, it is unlikely to be of clinical significance. Other: Severe pelvic floor laxity. Musculoskeletal: No acute fracture or aggressive appearing lytic or blastic osseous lesion. IMPRESSION: VASCULAR 1. 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 2. 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 3. Extensive atherosclerotic  calcifications throughout the abdominal aorta without aneurysm or dissection. 4. Heavily diseased and atrophied bilateral renal arteries. 5. Mild cardiomegaly. NON-VASCULAR 1. No acute abnormality within the abdomen or pelvis. 2. Patchy airspace opacities in the right lower lobe are favored to reflect small volume aspiration changes. 3. Small right and moderate left layering pleural effusions. 4. Advanced pelvic floor laxity. 5. Numerous chronic findings as enumerated above without significant interval change compared to prior imaging. Aortic Atherosclerosis (ICD10-I70.0). Signed, Sterling Big, MD, RPVI [facility] Electronically Signed   By: Malachy Moan M.D.   On: 05/18/2023 06:32   DG Chest 1 View  Result Date: 05/17/2023 CLINICAL DATA:  Status post right thoracentesis. EXAM: CHEST  1 VIEW COMPARISON:  Chest x-ray dated May 13, 2023. FINDINGS: Unchanged tunneled right internal jugular dialysis catheter. Stable cardiomegaly and mild diffuse interstitial thickening. Resolved right pleural effusion after thoracentesis. No pneumothorax. Improved aeration of the right lung base. Unchanged small left pleural effusion and left basilar atelectasis. No acute osseous abnormality. IMPRESSION: 1. Resolved right pleural effusion after thoracentesis. No pneumothorax. 2. Unchanged small left pleural effusion and left basilar atelectasis. Electronically Signed   By: Obie Dredge M.D.   On: 05/17/2023 12:32   US THORACENTESIS ASP PLEURAL SPACE W/IMG GUIDE  Result Date: 05/17/2023 INDICATION: Pleural effusion.  Previous left thoracentesis 2 weeks ago. EXAM: ULTRASOUND GUIDED DIAGNOSTIC AND THERAPEUTIC RIGHT THORACENTESIS MEDICATIONS: 10 mL 1% lidocaine. COMPLICATIONS: None immediate. PROCEDURE: An ultrasound guided thoracentesis was thoroughly discussed with the patient and questions answered. The benefits, risks, alternatives and complications  were also discussed. The patient understands and wishes to proceed with the procedure. Written consent was obtained. Ultrasound was performed to localize and mark an adequate pocket of fluid in the right chest. The area was then prepped and draped in the normal sterile fashion. 1% Lidocaine was used  for local anesthesia. Under ultrasound guidance a 6 Fr Safe-T-Centesis catheter was introduced. Thoracentesis was performed. The catheter was removed and a dressing applied. FINDINGS: A total of approximately 750 mL of clear yellow fluid was removed. Samples were sent to the laboratory as requested by the clinical team. IMPRESSION: Successful ultrasound guided right thoracentesis yielding 750 mL of pleural fluid. Electronically Signed   By: Obie Dredge M.D.   On: 05/17/2023 10:44    Assessment: *Abdominal pain-chronic *Nausea and vomiting *Diarrhea *Weight loss *Esophageal dysphagia *SMA stenosis  Plan: Patient quite somnolent on exam today.  History taken from with her son Felicia Williams who is bedside.  I discussed CT angiogram with radiologist Dr. 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  Nothing acute that needs immediate intervention.  Recommended outpatient follow-up with IR.  Upon talking to the son further, patient does appear to have significant esophageal dysphagia.  He reports that she often feels like food gets stuck in her substernal region with frequent regurgitation.  Will plan on EGD tomorrow to evaluate for peptic ulcer disease, esophagitis, gastritis, H. Pylori, duodenitis, or other. Will also evaluate for esophageal stricture, Schatzki's ring, esophageal web or other.   The risks including infection, bleed, or perforation as well as benefits, limitations, alternatives and imponderables have been reviewed with the patient. Potential for esophageal dilation, biopsy, etc. have also been reviewed.  Questions have been  answered. All parties agreeable.  Continue PPI twice daily.  Continue monitor H&H and transfuse for less than 7.  Further recommendations after upper endoscopy.   Hennie Duos. Marletta Lor, D.O. Gastroenterology and Hepatology [facility]   LOS: 5 days    05/18/2023, 12:32 PM

## 2023-05-19 ENCOUNTER — Encounter (HOSPITAL_COMMUNITY)
Admission: EM | Disposition: A | Discharge status: Home Health | Payer: Self-pay | Source: Home / Self Care | Attending: Family Medicine

## 2023-05-19 ENCOUNTER — Inpatient Hospital Stay (HOSPITAL_COMMUNITY): Payer: Medicare Other | Admitting: Anesthesiology

## 2023-05-19 DIAGNOSIS — K2970 Gastritis, unspecified, without bleeding: Secondary | ICD-10-CM

## 2023-05-19 DIAGNOSIS — R1310 Dysphagia, unspecified: Secondary | ICD-10-CM | POA: Diagnosis not present

## 2023-05-19 DIAGNOSIS — K222 Esophageal obstruction: Secondary | ICD-10-CM | POA: Diagnosis not present

## 2023-05-19 DIAGNOSIS — I5042 Chronic combined systolic (congestive) and diastolic (congestive) heart failure: Secondary | ICD-10-CM | POA: Diagnosis not present

## 2023-05-19 DIAGNOSIS — R1010 Upper abdominal pain, unspecified: Secondary | ICD-10-CM | POA: Diagnosis not present

## 2023-05-19 DIAGNOSIS — K551 Chronic vascular disorders of intestine: Secondary | ICD-10-CM | POA: Diagnosis not present

## 2023-05-19 DIAGNOSIS — R634 Abnormal weight loss: Secondary | ICD-10-CM | POA: Diagnosis not present

## 2023-05-19 DIAGNOSIS — N186 End stage renal disease: Secondary | ICD-10-CM | POA: Diagnosis not present

## 2023-05-19 DIAGNOSIS — R1013 Epigastric pain: Secondary | ICD-10-CM | POA: Diagnosis not present

## 2023-05-19 DIAGNOSIS — R112 Nausea with vomiting, unspecified: Secondary | ICD-10-CM | POA: Diagnosis not present

## 2023-05-19 HISTORY — PX: BIOPSY: SHX5522

## 2023-05-19 HISTORY — PX: ESOPHAGOGASTRODUODENOSCOPY (EGD) WITH PROPOFOL: SHX5813

## 2023-05-19 LAB — RENAL FUNCTION PANEL
Albumin: 2.2 g/dL — ABNORMAL LOW (ref 3.5–5.0)
Anion gap: 17 — ABNORMAL HIGH (ref 5–15)
BUN: 44 mg/dL — ABNORMAL HIGH (ref 8–23)
CO2: 21 mmol/L — ABNORMAL LOW (ref 22–32)
Calcium: 8.1 mg/dL — ABNORMAL LOW (ref 8.9–10.3)
Chloride: 95 mmol/L — ABNORMAL LOW (ref 98–111)
Creatinine, Ser: 5.83 mg/dL — ABNORMAL HIGH (ref 0.44–1.00)
GFR, Estimated: 7 mL/min — ABNORMAL LOW (ref >60)
Glucose, Bld: 63 mg/dL — ABNORMAL LOW (ref 70–99)
Phosphorus: 7.8 mg/dL — ABNORMAL HIGH (ref 2.5–4.6)
Potassium: 4.4 mmol/L (ref 3.5–5.1)
Sodium: 133 mmol/L — ABNORMAL LOW (ref 135–145)

## 2023-05-19 SURGERY — ESOPHAGOGASTRODUODENOSCOPY (EGD) WITH PROPOFOL
Anesthesia: General

## 2023-05-19 MED ORDER — LIDOCAINE HCL (CARDIAC) PF 100 MG/5ML IV SOSY
PREFILLED_SYRINGE | INTRAVENOUS | Status: DC | PRN
  Administered 2023-05-19: 100 mg via INTRAVENOUS

## 2023-05-19 MED ORDER — DEXMEDETOMIDINE HCL IN NACL 80 MCG/20ML IV SOLN
INTRAVENOUS | Status: DC | PRN
  Administered 2023-05-19 (×2): 10 ug via INTRAVENOUS

## 2023-05-19 MED ORDER — PROPOFOL 500 MG/50ML IV EMUL
INTRAVENOUS | Status: DC | PRN
  Administered 2023-05-19: 100 mg via INTRAVENOUS
  Administered 2023-05-19 (×2): 20 mg via INTRAVENOUS

## 2023-05-19 MED ORDER — BUTAMBEN-TETRACAINE-BENZOCAINE 2-2-14 % EX AERO
INHALATION_SPRAY | CUTANEOUS | Status: DC | PRN
  Administered 2023-05-19 (×2): 1 via TOPICAL

## 2023-05-19 MED ORDER — LACTATED RINGERS IV SOLN: INTRAVENOUS | Status: DC | PRN

## 2023-05-19 MED ORDER — LIDOCAINE HCL (PF) 2 % IJ SOLN
INTRAMUSCULAR | Status: AC
  Filled 2023-05-19: qty 5

## 2023-05-19 NOTE — Anesthesia Postprocedure Evaluation (Signed)
Anesthesia Post Note  Patient: Felicia Williams  Procedure(s) Performed: ESOPHAGOGASTRODUODENOSCOPY (EGD) WITH PROPOFOL BIOPSY  Patient location during evaluation: PACU Anesthesia Type: General Level of consciousness: awake and alert Pain management: pain level controlled Vital Signs Assessment: post-procedure vital signs reviewed and stable Respiratory status: spontaneous breathing, nonlabored ventilation, respiratory function stable and patient connected to nasal cannula oxygen Cardiovascular status: blood pressure returned to baseline and stable Postop Assessment: no apparent nausea or vomiting Anesthetic complications: no   There were no known notable events for this encounter.   Last Vitals:  Vitals:   05/19/23 1030 05/19/23 1045  BP: (!) 128/46 (!) 156/52  Pulse: 70 72  Resp: (!) 25 (!) 27  Temp:  36.5 C  SpO2: 100% 100%    Last Pain:  Vitals:   05/19/23 1045  TempSrc:   PainSc: 0-No pain                 Ninfa Giannelli L Trang Bouse

## 2023-05-19 NOTE — Interval H&P Note (Signed)
History and Physical Interval Note:  05/19/2023 9:50 AM  Felicia Williams  has presented today for surgery, with the diagnosis of Esophageal dysphagia, weight loss, epigastric pain.  The various methods of treatment have been discussed with the patient and family. After consideration of risks, benefits and other options for treatment, the patient has consented to  Procedure(s): ESOPHAGOGASTRODUODENOSCOPY (EGD) WITH PROPOFOL (N/A) as a surgical intervention.  The patient's history has been reviewed, patient examined, no change in status, stable for surgery.  I have reviewed the patient's chart and labs.  Questions were answered to the patient's satisfaction.     [name]

## 2023-05-19 NOTE — Evaluation (Signed)
Clinical/Bedside Swallow Evaluation Patient Details  Name: Felicia Williams MRN: [id number] Date of Birth: 1941/01/22  Today's Date: 05/19/2023 Time: SLP Start Time (ACUTE ONLY): 1500 SLP Stop Time (ACUTE ONLY): 1531 SLP Time Calculation (min) (ACUTE ONLY): 31 min  Past Medical History:  Past Medical History:  Diagnosis Date   Anemia in ESRD (end-stage renal disease) (HCC) 08/18/2018   CHF (congestive heart failure) (HCC)    Coccyx contusion--with chronic pain due to fall    Depression    Dyspnea    02 2l via [place]   ESRD on hemodialysis (HCC)    Essential hypertension, benign    Gastric polyps    Gastroparesis    followed by Dr. Karilyn Cota.   GERD (gastroesophageal reflux disease)    Glomerulonephritis    Gout    Mixed hyperlipidemia    Spondylosis    Past Surgical History:  Past Surgical History:  Procedure Laterality Date   APPENDECTOMY     AV FISTULA PLACEMENT Left 03/20/2022   Procedure: INSERTION OF LEFT UPPER ARM ARTERIOVENOUS (AV) GORE-TEX GRAFT;  Surgeon: Larina Earthly, MD;  Location: [facility];  Service: Vascular;  Laterality: Left;   CATARACT EXTRACTION Bilateral    COLONOSCOPY N/A 12/28/2015   Procedure: COLONOSCOPY;  Surgeon: Malissa Hippo, MD;  Location: [facility];  Service: Endoscopy;  Laterality: N/A;  815   ESOPHAGOGASTRODUODENOSCOPY N/A 11/16/2020   Procedure: ESOPHAGOGASTRODUODENOSCOPY (EGD);  Surgeon: Malissa Hippo, MD;  Location: [facility];  Service: Endoscopy;  Laterality: N/A;  1:15   EXCHANGE OF A DIALYSIS CATHETER N/A 03/20/2022   Procedure: EXCHANGE OF A DIALYSIS CATHETER;  Surgeon: Larina Earthly, MD;  Location: [facility];  Service: Vascular;  Laterality: N/A;   INSERTION OF DIALYSIS CATHETER Right 09/30/2021   Procedure: INSERTION OF TUNNELED DIALYSIS CATHETER;  Surgeon: Maeola Harman, MD;  Location: [facility] OR;  Service: Vascular;  Laterality: Right;   IR FLUORO GUIDE CV LINE RIGHT  01/16/2019   IR REMOVAL TUN CV CATH W/O FL  07/01/2019    IR THORACENTESIS ASP PLEURAL SPACE W/IMG GUIDE  05/01/2023   IR THROMBECTOMY AV FISTULA W/THROMBOLYSIS/PTA INC/SHUNT/IMG LEFT Left 02/01/2023   IR US GUIDE VASC ACCESS LEFT  02/01/2023   IR US GUIDE VASC ACCESS RIGHT  01/16/2019   POLYPECTOMY  11/16/2020   Procedure: POLYPECTOMY;  Surgeon: Malissa Hippo, MD;  Location: [facility];  Service: Endoscopy;;  gastric   REMOVAL OF A DIALYSIS CATHETER  09/30/2021   Procedure: REMOVAL OF A PERITONEAL DIALYSIS CATHETER;  Surgeon: Maeola Harman, MD;  Location: [facility] OR;  Service: Vascular;;   REMOVAL OF A DIALYSIS CATHETER N/A 05/22/2022   Procedure: MINOR REMOVAL OF A TUNNELED DIALYSIS CATHETER;  Surgeon: Larina Earthly, MD;  Location: [facility];  Service: Vascular;  Laterality: N/A;   REVISON OF ARTERIOVENOUS FISTULA Left 04/30/2023   Procedure: LEFT ARM FISTULA REVISION, INTERPOSITIONAL ARTEGRAFT;  Surgeon: Victorino Sparrow, MD;  Location: [facility] OR;  Service: Vascular;  Laterality: Left;   TUBAL LIGATION     HPI:  Felicia Williams is an 82 y.o. 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 CT abdomen pelvis without contrast showed increased bilateral  pleural effusions with lower lobe consolidation when compared with the prior exam. She was admitted, doxycycline course has been completed,  gastroparesis treated with reglan though GI symptoms continue. GI consult requested. Pleural effusion was drained 10/25 without significant change in respiratory status. GI note reports "Will plan on EGD tomorrow to evaluate for peptic ulcer disease, esophagitis, gastritis, H. Pylori, duodenitis, or other. Will also evaluate for esophageal stricture, Schatzki's ring, esophageal web or other."    Assessment / Plan / Recommendation   Clinical Impression  Clinical swallowing evaluation completed. Pt's daughter at bedside provided report of long hx of esophageal dysphagia with associated frequent regurgitation of solids and pills after swallowing. She further reports occasional immediate coughing after swallowing liquids. Pt is very lethargic for my evaluation and briefly opened her eyes. SLP provided oral care and Pt did respond and report that "yes" she did want an ice chip but Pt did not/could not open her eyes to take the ice chip. Pt was orally responsive with the ice chip and ice chip was manually passed over labial surface. Pt did not become alert enough to drop ice chip in oral cavity. Demonstrated oral care to Pt's daughter and recommend continue NPO pending improvide alertness and full BSE trials. Recommend oral care QID while Pt is NPO. ST will continue efforts. Thank you for this referral, SLP Visit Diagnosis: Dysphagia, oropharyngeal phase (R13.12)    Aspiration Risk  Mild aspiration risk;Moderate aspiration risk    Diet Recommendation NPO    Medication Administration: Via alternative means    Other  Recommendations Oral Care Recommendations: Oral care QID    Recommendations for follow up therapy are one component of a multi-disciplinary discharge planning process, led by the attending physician.  Recommendations may be updated based on patient status, additional functional criteria and insurance authorization.           Frequency and Duration min 1 x/week  1 week       Prognosis Prognosis for improved oropharyngeal function: Fair      Swallow Study   General Date of Onset: 05/13/23 HPI: Felicia Williams is an 82 y.o. 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 CT abdomen  pelvis without contrast showed increased bilateral  pleural effusions with lower lobe consolidation when compared with the prior exam. She was admitted, doxycycline course has been completed, gastroparesis treated with reglan though GI symptoms continue. GI consult requested. Pleural effusion was drained 10/25 without significant change in respiratory status. GI note reports "Will plan on EGD tomorrow to evaluate for peptic ulcer disease, esophagitis, gastritis, H. Pylori, duodenitis, or other. Will also evaluate for esophageal stricture, Schatzki's ring, esophageal web or other." Type of Study: Bedside Swallow Evaluation Previous Swallow Assessment: Most Recent BSE 02/18/2023 reg/thin Diet Prior to this Study: NPO Temperature Spikes Noted: No Respiratory Status: Room air History of Recent Intubation: No Behavior/Cognition: Alert;Cooperative;Pleasant mood Oral Cavity Assessment: Within Functional Limits Oral Care Completed by SLP: Yes Oral Cavity - Dentition: Adequate natural dentition;Missing dentition Vision: Functional for self-feeding Self-Feeding Abilities: Able to feed self;Needs assist;Needs set up Patient Positioning: Upright in bed Baseline Vocal Quality: Normal Volitional Cough: Strong Volitional Swallow: Able to elicit    Oral/Motor/Sensory Function Overall Oral Motor/Sensory Function: Within functional limits   Ice Chips Ice chips: Not tested   Thin Liquid Thin Liquid: Not tested    Nectar Thick Nectar Thick Liquid: Not tested   Honey Thick Honey Thick  Liquid: Not tested   Puree Puree: Not tested   Solid     Solid: Not tested     Felicia Williams H. Romie Levee, CCC-SLP Speech Language Pathologist  Georgetta Haber 05/19/2023,3:32 PM

## 2023-05-19 NOTE — Anesthesia Preprocedure Evaluation (Addendum)
Anesthesia Evaluation  Patient identified by MRN, date of birth, ID band Patient awake    Reviewed: Allergy & Precautions, NPO status , Patient's Chart, lab work & pertinent test results  History of Anesthesia Complications Negative for: history of anesthetic complications  Airway Mallampati: II  TM Distance: >3 FB Neck ROM: Full    Dental no notable dental hx. (+) Dental Advisory Given, Teeth Intact   Pulmonary shortness of breath   Pulmonary exam normal breath sounds clear to auscultation       Cardiovascular Exercise Tolerance: Poor hypertension, Pt. on medications + Peripheral Vascular Disease and +CHF  Normal cardiovascular exam Rhythm:Regular Rate:Normal  EF 35 - 40%.  Grade 1 diastolic dysfunction.  Normal stress nuclear study   Neuro/Psych  PSYCHIATRIC DISORDERS Anxiety Depression    negative neurological ROS     GI/Hepatic ,GERD  Medicated and Controlled,,gastroparesis   Endo/Other  negative endocrine ROS    Renal/GU ESRF and DialysisRenal disease (peritoneal dialysis - everyday)     Musculoskeletal   Abdominal Normal abdominal exam  (+)   Peds  Hematology  (+) Blood dyscrasia, anemia   Anesthesia Other Findings   Reproductive/Obstetrics                             Anesthesia Physical Anesthesia Plan  ASA: 4  Anesthesia Plan: General   Post-op Pain Management: Minimal or no pain anticipated   Induction: Intravenous  PONV Risk Score and Plan: Propofol infusion  Airway Management Planned: Nasal Cannula and Natural Airway  Additional Equipment: None  Intra-op Plan:   Post-operative Plan:   Informed Consent: I have reviewed the patients History and Physical, chart, labs and discussed the procedure including the risks, benefits and alternatives for the proposed anesthesia with the patient or authorized representative who has indicated his/her understanding and  acceptance.     Dental advisory given  Plan Discussed with: CRNA  Anesthesia Plan Comments:         Anesthesia Quick Evaluation

## 2023-05-19 NOTE — Op Note (Signed)
[facility] Patient Name: Felicia Williams Procedure Date: 05/19/2023 9:47 AM MRN: [id number] Date of Birth: 1940-11-15 Attending MD: Hennie Duos. Marletta Lor , [place], [phone] CSN: [id number] Age: 82 Admit Type: Inpatient Procedure:                Upper GI endoscopy Indications:              Upper abdominal pain, Dysphagia, Weight loss Providers:                Hennie Duos. Marletta Lor, DO, Nena Polio, RN, Durwin Glaze Tech, Technician Referring MD:              Medicines:                See the Anesthesia note for documentation of the                            administered medications Complications:            No immediate complications. Estimated Blood Loss:     Estimated blood loss was minimal. Procedure:                Pre-Anesthesia Assessment:                           - The anesthesia plan was to use monitored                            anesthesia care (MAC).                           After obtaining informed consent, the endoscope was                            passed under direct vision. Throughout the                            procedure, the patient's blood pressure, pulse, and                            oxygen saturations were monitored continuously. The                            [id number]-H190 ([id number]) scope was introduced through the                            mouth, and advanced to the second part of duodenum.                            The upper GI endoscopy was accomplished without                            difficulty. The patient tolerated the procedure  well. Scope In: 10:13:31 AM Scope Out: 10:17:38 AM Total Procedure Duration: 0 hours 4 minutes 7 seconds  Findings:      A small hiatal hernia was present.      A mild Schatzki ring was found in the distal esophagus. A TTS dilator       was passed through the scope. Dilation with an 18-19-20 mm balloon       dilator was performed to 20 mm. The dilation site was  examined and       showed moderate improvement in luminal narrowing.      Diffuse mild inflammation characterized by erythema was found in the       entire examined stomach. Biopsies were taken with a cold forceps for       Helicobacter pylori testing.      The duodenal bulb, first portion of the duodenum and second portion of       the duodenum were normal. Impression:               - Small hiatal hernia.                           - Mild Schatzki ring. Dilated.                           - Gastritis. Biopsied.                           - Normal duodenal bulb, first portion of the                            duodenum and second portion of the duodenum. Moderate Sedation:      Per Anesthesia Care Recommendation:           - Return patient to hospital ward for ongoing care.                           - Soft diet.                           - Use a proton pump inhibitor PO BID.                           - Outpatient follow up with IR for possible                            mesenteric ischemia                           - May need to restart on metoclopramide for                            gastroparesis 5mg  TID Procedure Code(s):        --- Professional ---                           [phone], Esophagogastroduodenoscopy, flexible,  transoral; with transendoscopic balloon dilation of                            esophagus (less than 30 mm diameter)                           43239, 59, Esophagogastroduodenoscopy, flexible,                            transoral; with biopsy, single or multiple Diagnosis Code(s):        --- Professional ---                           K44.9, Diaphragmatic hernia without obstruction or                            gangrene                           K22.2, Esophageal obstruction                           K29.70, Gastritis, unspecified, without bleeding                           R10.10, Upper abdominal pain, unspecified                            R13.10, Dysphagia, unspecified                           R63.4, Abnormal weight loss CPT copyright 2022 American Medical Association. All rights reserved. The codes documented in this report are preliminary and upon coder review may  be revised to meet current compliance requirements. Hennie Duos. Marletta Lor, DO Hennie Duos. Marletta Lor, DO 05/19/2023 10:25:03 AM This report has been signed electronically. Number of Addenda: 0

## 2023-05-19 NOTE — Transfer of Care (Signed)
Immediate Anesthesia Transfer of Care Note  Patient: Felicia Williams  Procedure(s) Performed: ESOPHAGOGASTRODUODENOSCOPY (EGD) WITH PROPOFOL  Patient Location: PACU  Anesthesia Type:General  Level of Consciousness: sedated  Airway & Oxygen Therapy: Patient connected to nasal cannula oxygen  Post-op Assessment: Report given to RN  Post vital signs: stable  Last Vitals:  Vitals Value Taken Time  BP 111/33 05/19/23 1030  Temp    Pulse 71 05/19/23 1030  Resp 22 05/19/23 1030  SpO2 100 % 05/19/23 1030  Vitals shown include unfiled device data.  Last Pain:  Vitals:   05/18/23 1945  TempSrc: Oral  PainSc: 0-No pain      Patients Stated Pain Goal: 0 (05/17/23 1805)  Complications: There were no known notable events for this encounter.

## 2023-05-19 NOTE — Progress Notes (Signed)
[facility] PROGRESS NOTE  Felicia Williams (DOB: 13-Feb-1941) OZH:[id number] PCP: Rebekah Chesterfield, NP  Brief Narrative: Felicia Williams is an 82 y.o. 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    CT abdomen pelvis without contrast showed increased bilateral  pleural effusions with lower lobe consolidation when compared with the prior exam. Protonix was given, Reglan was given.  Lokelma due to hyperkalemia was also given. Hospitalist was asked to admit patient for further evaluation and management.  She was admitted, doxycycline course has been completed, gastroparesis treated with reglan though GI symptoms continue. GI consult requested. Pleural effusion was drained 10/25 without significant change in respiratory status.   Subjective: Spoke with family at bedside when pt was down for EGD, then saw her when she returned. She's drowsy had reported event of choking on pills. Family states she's been lethargic ever since that ativan the other night.   Objective: BP (!) 149/44 (BP Location: Right Leg)   Pulse 78   Temp 98.5 F (36.9 C) (Axillary)   Resp 20   Wt 38.3 kg   SpO2 96%   BMI 17.05 kg/m   Gen: Frail elderly female in no distress Pulm: Clear anterolaterally, nonlabored  CV: RRR, no MRG GI: Soft, NT, ND, +BS Neuro: Limited exam, rouses briefly.  Skin: LUA AVG surgical site without exudate, stable erythema. TDC site c/d/i  EGD 10/27 Dr. Marletta Lor:  Impression:        - Small hiatal hernia.                           - Mild Schatzki ring. Dilated.                           - Gastritis. Biopsied.                           - Normal duodenal bulb, first portion of the                            duodenum and second portion of the  duodenum.  Recommendation: Return patient to hospital ward for ongoing care.                           - Soft diet.                           - Use a proton pump inhibitor PO BID.                           - Outpatient follow up with IR for possible                            mesenteric ischemia                           - May need to restart on metoclopramide for  gastroparesis 5mg  TID  Assessment & Plan: Nausea, vomiting, abdominal pain: No acute findings on CT. Amylase, lipase wnl.  - Some symptoms attributed to doxycycline, though, still remained after discontinuation. 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 IR follow up recommended for what could be culprit lesion (SMA) for chronic mesenteric ischemia.  - EGD 10/27 Dr. Marletta Lor showed Schatzki's ring which was dilated and gastritis which was biopsied. Follow up biopsies.   - Continue prn zofran.  - Continue PPI BID.  - Could restart metoclopramide 5mg  TID if symptoms persist per GI. - Limit narcotics (though these are chronic and cannot be stopped)  Choking episode:  - Would keep her NPO while still drowsy (had ativan previously and now anesthesia care during EGD). SLP evaluation requested, pt will need to be more alert.   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 LVEF showed global hypokinesis with LVEF 35-40%, G1DD  - Continue monitoring clinically with serial dialysis hoping to take fluid off. Repeat thoracentesis this admission 10/25 consistent with transudate, negative gram stain, will follow culture and cytology. This did not have significant impact on patient's subjective respiratory status nor oxygenation.  - Defer cardiology evaluation to outpatient setting as long as  no anginal symptoms appear.   ESRD:  - Nephrology following, continue HD per routine.   Hyperkalemia: Resolved, Tx with dialysis.  Acute encephalopathy: ?Delirium vs. mild uremia. B12 replete, ammonia normal. Onset after ativan argues for metabolic insult, again received anesthesia care and is drowsy afterward.  - Continue monitoring closely. Current plan is to defer neuroimaging, rEEG, etc.  - Prefer lowest possible dose of medications, e.g. ativan 1mg  caused long lasting sedation.  HTN:  - Continue coreg, hydralazine.   Left sacral stage 2 pressure injury: POA.  - Offload, optimize nutrition.   History of infected AVG: s/p excision, placement of artegraft by Dr. Karin Lieu.  - Completed doxycycline 10/23. Staples in place though appears to be healing well. If still admitted 2023-06-17, would let [facility] know as she has follow up that day, may remove staples at that time.   DNR: Status confirmed with palliative consultation with patient on 10/25. Gold form in chart.   Weakness: This continues to be significant.  - Will continue getting up as much as tolerated, PT/OT efforts appreciated. Family concerned at her current level of functioning, may need rehabilitation prior to returning home.  Tyrone Nine, MD [facility] [email or web address] 05/19/2023, 12:42 PM

## 2023-05-20 ENCOUNTER — Inpatient Hospital Stay (HOSPITAL_COMMUNITY): Payer: Medicare Other

## 2023-05-20 DIAGNOSIS — K449 Diaphragmatic hernia without obstruction or gangrene: Secondary | ICD-10-CM

## 2023-05-20 DIAGNOSIS — I774 Celiac artery compression syndrome: Secondary | ICD-10-CM

## 2023-05-20 DIAGNOSIS — Z515 Encounter for palliative care: Secondary | ICD-10-CM | POA: Diagnosis not present

## 2023-05-20 DIAGNOSIS — G9340 Encephalopathy, unspecified: Secondary | ICD-10-CM

## 2023-05-20 DIAGNOSIS — K551 Chronic vascular disorders of intestine: Secondary | ICD-10-CM | POA: Diagnosis not present

## 2023-05-20 DIAGNOSIS — H7492 Unspecified disorder of left middle ear and mastoid: Secondary | ICD-10-CM

## 2023-05-20 DIAGNOSIS — K222 Esophageal obstruction: Secondary | ICD-10-CM

## 2023-05-20 DIAGNOSIS — N186 End stage renal disease: Secondary | ICD-10-CM | POA: Diagnosis not present

## 2023-05-20 DIAGNOSIS — K2970 Gastritis, unspecified, without bleeding: Secondary | ICD-10-CM

## 2023-05-20 DIAGNOSIS — R1013 Epigastric pain: Secondary | ICD-10-CM | POA: Diagnosis not present

## 2023-05-20 DIAGNOSIS — R1310 Dysphagia, unspecified: Secondary | ICD-10-CM | POA: Diagnosis not present

## 2023-05-20 DIAGNOSIS — J323 Chronic sphenoidal sinusitis: Secondary | ICD-10-CM

## 2023-05-20 DIAGNOSIS — I5042 Chronic combined systolic (congestive) and diastolic (congestive) heart failure: Secondary | ICD-10-CM | POA: Diagnosis not present

## 2023-05-20 DIAGNOSIS — Z7189 Other specified counseling: Secondary | ICD-10-CM | POA: Diagnosis not present

## 2023-05-20 LAB — RENAL FUNCTION PANEL
Albumin: 2.2 g/dL — ABNORMAL LOW (ref 3.5–5.0)
Anion gap: 23 — ABNORMAL HIGH (ref 5–15)
BUN: 61 mg/dL — ABNORMAL HIGH (ref 8–23)
CO2: 16 mmol/L — ABNORMAL LOW (ref 22–32)
Calcium: 8.2 mg/dL — ABNORMAL LOW (ref 8.9–10.3)
Chloride: 96 mmol/L — ABNORMAL LOW (ref 98–111)
Creatinine, Ser: 7.53 mg/dL — ABNORMAL HIGH (ref 0.44–1.00)
GFR, Estimated: 5 mL/min — ABNORMAL LOW (ref >60)
Glucose, Bld: 38 mg/dL — CL (ref 70–99)
Phosphorus: 10.6 mg/dL — ABNORMAL HIGH (ref 2.5–4.6)
Potassium: 5.2 mmol/L — ABNORMAL HIGH (ref 3.5–5.1)
Sodium: 135 mmol/L (ref 135–145)

## 2023-05-20 LAB — CBC
HCT: 38.5 % (ref 36.0–46.0)
Hemoglobin: 11.5 g/dL — ABNORMAL LOW (ref 12.0–15.0)
MCH: 26.6 pg (ref 26.0–34.0)
MCHC: 29.9 g/dL — ABNORMAL LOW (ref 30.0–36.0)
MCV: 89.1 fL (ref 80.0–100.0)
Platelets: 162 10*3/uL (ref 150–400)
RBC: 4.32 MIL/uL (ref 3.87–5.11)
RDW: 17.9 % — ABNORMAL HIGH (ref 11.5–15.5)
WBC: 10.9 10*3/uL — ABNORMAL HIGH (ref 4.0–10.5)
nRBC: 0 % (ref 0.0–0.2)

## 2023-05-20 LAB — GLUCOSE, CAPILLARY: Glucose-Capillary: 207 mg/dL — ABNORMAL HIGH (ref 70–99)

## 2023-05-20 MED ORDER — HEPARIN SODIUM (PORCINE) 1000 UNIT/ML IJ SOLN
INTRAMUSCULAR | Status: AC
  Filled 2023-05-20: qty 4

## 2023-05-20 MED ORDER — OXYCODONE HCL 5 MG PO TABS
5.0000 mg | ORAL_TABLET | Freq: Four times a day (QID) | ORAL | Status: DC | PRN
  Administered 2023-05-20: 5 mg via ORAL
  Filled 2023-05-20: qty 1

## 2023-05-20 MED ORDER — SEVELAMER CARBONATE 800 MG PO TABS
1600.0000 mg | ORAL_TABLET | Freq: Three times a day (TID) | ORAL | Status: DC
  Filled 2023-05-20 (×2): qty 2

## 2023-05-20 MED ORDER — SODIUM CHLORIDE 0.9 % IV SOLN
3.0000 g | Freq: Once | INTRAVENOUS | Status: AC
  Administered 2023-05-20: 3 g via INTRAVENOUS
  Filled 2023-05-20: qty 8

## 2023-05-20 MED ORDER — DEXTROSE 50 % IV SOLN: 25.0000 g | INTRAVENOUS | Status: AC

## 2023-05-20 MED ORDER — SCOPOLAMINE 1 MG/3DAYS TD PT72
1.0000 | MEDICATED_PATCH | TRANSDERMAL | Status: DC
  Administered 2023-05-20: 1 mg via TRANSDERMAL
  Filled 2023-05-20: qty 1

## 2023-05-20 MED ORDER — ACETAMINOPHEN 650 MG RE SUPP
650.0000 mg | RECTAL | Status: DC | PRN
  Administered 2023-05-20 – 2023-05-21 (×3): 650 mg via RECTAL
  Filled 2023-05-20 (×3): qty 1

## 2023-05-20 MED ORDER — ALBUMIN HUMAN 25 % IV SOLN
25.0000 g | Freq: Once | INTRAVENOUS | Status: AC
  Administered 2023-05-20: 25 g via INTRAVENOUS

## 2023-05-20 MED ORDER — TRIAMCINOLONE ACETONIDE 0.025 % EX CREA
TOPICAL_CREAM | Freq: Three times a day (TID) | CUTANEOUS | Status: DC
  Administered 2023-05-21: 1 via TOPICAL
  Filled 2023-05-20: qty 15

## 2023-05-20 MED ORDER — TRIAMCINOLONE ACETONIDE 0.5 % EX OINT
TOPICAL_OINTMENT | Freq: Two times a day (BID) | CUTANEOUS | Status: DC | PRN
  Filled 2023-05-20: qty 15

## 2023-05-20 MED ORDER — SODIUM CHLORIDE 0.9 % IV SOLN
3.0000 g | INTRAVENOUS | Status: DC
  Administered 2023-05-21: 3 g via INTRAVENOUS
  Filled 2023-05-20: qty 8

## 2023-05-20 MED ORDER — GLYCOPYRROLATE 0.2 MG/ML IJ SOLN
0.1000 mg | Freq: Once | INTRAMUSCULAR | Status: AC
  Administered 2023-05-20: 0.1 mg via INTRAVENOUS
  Filled 2023-05-20: qty 1

## 2023-05-20 MED ORDER — ACETAMINOPHEN 10 MG/ML IV SOLN
1000.0000 mg | Freq: Once | INTRAVENOUS | Status: AC
  Administered 2023-05-20: 1000 mg via INTRAVENOUS
  Filled 2023-05-20: qty 100

## 2023-05-20 MED ORDER — DEXTROSE 50 % IV SOLN
INTRAVENOUS | Status: AC
  Administered 2023-05-20: 25 g via INTRAVENOUS
  Filled 2023-05-20: qty 50

## 2023-05-20 MED ORDER — ALBUMIN HUMAN 25 % IV SOLN
INTRAVENOUS | Status: AC
Start: 2023-05-20 — End: ?
  Filled 2023-05-20: qty 100

## 2023-05-20 MED ORDER — TRAMADOL HCL 50 MG PO TABS: 50.0000 mg | ORAL_TABLET | Freq: Two times a day (BID) | ORAL | Status: DC | PRN

## 2023-05-20 NOTE — Progress Notes (Signed)
   05/20/23 1944  Assess: MEWS Score  Temp 99.8 F (37.7 C)  BP (!) 126/42  MAP (mmHg) 65  Pulse Rate 90  ECG Heart Rate 74  Resp (!) 21  Level of Consciousness Responds to Voice  SpO2 99 %  Assess: MEWS Score  MEWS Temp 0  MEWS Systolic 0  MEWS Pulse 0  MEWS RR 1  MEWS LOC 1  MEWS Score 2  MEWS Score Color Yellow  Assess: if the MEWS score is Yellow or Red  Were vital signs accurate and taken at a resting state? Yes  Does the patient meet 2 or more of the SIRS criteria? Yes  Does the patient have a confirmed or suspected source of infection? Yes  Notify: Charge Nurse/RN  Name of Charge Nurse/RN Notified Geneticist, molecular  Provider Notification  Provider Name/Title Emokpae  Date Provider Notified 05/20/23  Time Provider Notified 1946  Method of Notification Call  Notification Reason Change in status  Assess: SIRS CRITERIA  SIRS Temperature  0  SIRS Pulse 0  SIRS Respirations  1  SIRS WBC 0  SIRS Score Sum  1

## 2023-05-20 NOTE — Progress Notes (Signed)
Pharmacy Antibiotic Note  Felicia Williams is a 82 y.o. female admitted on 05/13/2023 with mastoiditis .  Pharmacy has been consulted for Unasyn dosing.  Plan: Unasyn 3000 mg IV every 24 hours. Monitor labs, c/s, and patient improvement.  Weight: 40.2 kg (88 lb 10 oz)  Temp (24hrs), Avg:98.1 F (36.7 C), Min:97.9 F (36.6 C), Max:98.4 F (36.9 C)  Recent Labs  Lab 05/15/23 0224 05/15/23 1311 05/16/23 0506 05/17/23 0417 05/18/23 0533 05/19/23 0458 05/20/23 0419  WBC 8.1  --   --  4.9  --   --  10.9*  CREATININE 10.29*   < > 6.15* 7.27* 4.13* 5.83* 7.53*   < > = values in this interval not displayed.    Estimated Creatinine Clearance: 3.6 mL/min (A) (by C-G formula based on SCr of 7.53 mg/dL (H)).    Allergies  Allergen Reactions   Cefepime Other (See Comments)    Feb 2023 Encephalopathy with questionable seizures. Seems to be tolerating cefazolin , but the daughter mentioned not to start her on any cephalosporin     Morphine Other (See Comments)    "Dry heaving like crazy"   Sulfa Antibiotics Other (See Comments)    Shut pt's kidneys down    Antimicrobials this admission: Unasyn 10/28 >>   Microbiology results: 10/25 Pleural fluid Cx: ngtd   Thank you for allowing pharmacy to be a part of this patient's care.  Judeth Cornfield, PharmD Clinical Pharmacist 05/20/2023 1:58 PM

## 2023-05-20 NOTE — Progress Notes (Addendum)
[facility] PROGRESS NOTE  Felicia Williams (DOB: 1940/11/02) NFA:[id number] PCP: Rebekah Chesterfield, NP  Brief Narrative: Felicia Williams is an 82 y.o. 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    CT abdomen pelvis without contrast showed increased bilateral  pleural effusions with lower lobe consolidation when compared with the prior exam. Protonix was given, Reglan was given.  Lokelma due to hyperkalemia was also given. Hospitalist was asked to admit patient for further evaluation and management.  She was admitted, doxycycline course has been completed, gastroparesis treated with reglan though GI symptoms continue. GI consult requested. Pleural effusion was drained 10/25 without significant change in respiratory status.   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   CT head showed no acute intracranial abnormalities, but did show left mastoid effusion. This correlates to tenderness on exam and patient confirms pain in her ears, developed bloody discharged from left ear on 10/28. Unasyn was started. Daughter at bedside and called later in the afternoon.  Subjective: Headache this morning, but drowsy and does not volunteer any history. Later in the day does confirm that her hearing has been poor and she has pain in her left ear. In HD she is more alert. I saw her several times today. Hypoglycemic this morning resolved with po intake.  Objective: BP (!) 112/58   Pulse 65   Temp 97.9 F (36.6 C) (Axillary)   Resp (!) 22   Wt 38.3 kg   SpO2 100%   BMI 17.05 kg/m   Gen: Elderly female in apparent discomfort HEENT: Left mastoid tenderness, not boggy, no fluctuance. Bloody discharge from left ear. Bilateral ear canals have  inflammation that, along with patient's pain limits visualization though no TM is confidently identified.  Pulm: Diminished at bases but no crackles or wheezes  CV: RRR, no MRG, no significant edema GI: Soft, NT, ND, +BS. Neuro: Oriented, more alert but pain limits interactivity. Moves all extremities without focal deficits. Ext: Warm, no deformities. Skin: LUE AVG site with reduced erythema, no tenderness, well apposed wound edges, R TDC c/d/I as well. No rashes, lesions or ulcers on visualized skin   EGD 10/27 Dr. Marletta Lor:  Impression:        - Small hiatal hernia.                           - Mild Schatzki ring. Dilated.                           - Gastritis. Biopsied.                           - Normal duodenal bulb, first portion of the                            duodenum and second portion of the duodenum.  Recommendation: Return patient to hospital ward for ongoing care.                           - Soft diet.                           -  Use a proton pump inhibitor PO BID.                           - Outpatient follow up with IR for possible                            mesenteric ischemia                           - May need to restart on metoclopramide for                            gastroparesis 5mg  TID  Assessment & Plan: Nausea, vomiting, abdominal pain: No acute findings on CT. Amylase, lipase wnl.  - Some symptoms attributed to doxycycline, though, still remained after discontinuation. 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 IR follow up recommended for what could be culprit lesion (SMA) for chronic mesenteric ischemia.  - EGD 10/27 Dr. Marletta Lor showed Schatzki's ring which was dilated and gastritis which was biopsied. Follow up biopsies.   - Continue prn zofran.  - Continue PPI BID.  - Could restart metoclopramide 5mg  TID if symptoms persist per GI. - Limit narcotics (though these are chronic and cannot be  stopped)  Left mastoiditis:  - Start unasyn - Pain control ordered, minimize sedating medications as much as possible - Pt has ENT referral already in place to [facility], had NP visits 9/5 and 9/12 for impacted cerumen of right ear canal that is not present at this time.   Choking episode:  - Would keep her NPO while still drowsy (had ativan previously and now anesthesia care during EGD). SLP evaluation requested, pt is more alert, dysphagia diet ordered.  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 LVEF showed global hypokinesis with LVEF 35-40%, G1DD  - Continue monitoring clinically with serial dialysis hoping to take fluid off. Repeat thoracentesis this admission 10/25 consistent with transudate, negative gram stain, will follow culture and cytology. This did not have significant impact on patient's subjective respiratory status nor oxygenation.  - Defer cardiology evaluation to outpatient setting as long as no anginal symptoms appear.   ESRD:  - Nephrology following, continue HD per routine.   Hyperkalemia: 5.2 today, Tx with dialysis.  Acute encephalopathy: ?Delirium vs. mild uremia. B12 replete, ammonia normal. Onset after ativan argues for metabolic insult, again received anesthesia care and is drowsy afterward.  - Continue monitoring closely. CT showed no acute intracranial abnormalities 10/28. - Prefer lowest possible dose of medications, e.g. ativan 1mg  caused long lasting sedation.  HTN:  - Continue coreg, hydralazine.   Left sacral stage 2 pressure injury: POA.  - Offload, optimize nutrition.   History of infected AVG: s/p excision, placement of artegraft by Dr. Karin Lieu.  - Completed doxycycline 10/23. Staples in place though appears to be healing well. If still admitted 10/31, would let [facility] know as she  has follow up that day, may remove staples at that time.   DNR: Status confirmed with palliative consultation with patient on 10/25. Gold form in chart.   Weakness: This continues to be significant.  - Will continue getting up as much as tolerated,  PT/OT efforts appreciated. Family concerned at her current level of functioning, may need rehabilitation prior to returning home.  Tyrone Nine, MD [facility] [email or web address] 05/20/2023, 1:25 PM

## 2023-05-20 NOTE — Progress Notes (Signed)
Gastroenterology Progress Note    Patient ID: Felicia Williams; [id number]; Dec 12, 1940    Subjective   Since Friday has had change in mental status. CT head ordered for today. Does not answer questions for me. Daughter at bedside. Notes she opened her eyes twice over the weekend. Daughter states patient has had ear and eye pain.    Objective   Vital signs in last 24 hours Temp:  [97.5 F (36.4 C)-98.5 F (36.9 C)] 98 F (36.7 C) (10/28 0516) Pulse Rate:  [67-86] 67 (10/28 0516) Resp:  [18-27] 18 (10/28 0516) BP: (128-156)/(35-56) 155/54 (10/28 0516) SpO2:  [94 %-100 %] 94 % (10/28 0516) Last BM Date : 05/16/23  Physical Exam General:   somnolent, moaning in bed intermittently, unable to give reliable history  Abdomen:  Bowel sounds present, soft, appears tender with palpation Msk:  Symmetrical without gross deformities. Normal posture.   Intake/Output from previous day: 10/27 0701 - 10/28 0700 In: 100 [I.V.:100] Out: -  Intake/Output this shift: No intake/output data recorded.  Lab Results  Recent Labs    05/20/23 0419  WBC 10.9*  HGB 11.5*  HCT 38.5  PLT 162   BMET Recent Labs    05/18/23 0533 05/19/23 0458 05/20/23 0419  NA 132* 133* 135  K 3.9 4.4 5.2*  CL 94* 95* 96*  CO2 26 21* 16*  GLUCOSE 101* 63* 38*  BUN 29* 44* 61*  CREATININE 4.13* 5.83* 7.53*  CALCIUM 7.7* 8.1* 8.2*   LFT Recent Labs    05/18/23 0533 05/19/23 0458 05/20/23 0419  ALBUMIN 2.1* 2.2* 2.2*    Studies/Results CT Angio Abd/Pel w/ and/or w/o  Result Date: 05/18/2023 CLINICAL DATA:  Epigastric pain, weight loss, nausea and vomiting EXAM: CTA ABDOMEN AND PELVIS WITHOUT AND WITH CONTRAST TECHNIQUE: Multidetector CT imaging of the abdomen and pelvis was performed using the standard protocol during bolus administration of intravenous contrast. Multiplanar reconstructed images and MIPs were obtained and reviewed to evaluate the vascular anatomy. RADIATION DOSE REDUCTION: This  exam was performed according to the departmental dose-optimization program which includes automated exposure control, adjustment of the mA and/or kV according to patient size and/or use of iterative reconstruction technique. CONTRAST:  80mL OMNIPAQUE IOHEXOL 350 MG/ML SOLN COMPARISON:  Prior CT scan of the abdomen and pelvis 05/13/2023; MRCP 12/14/2021 FINDINGS: VASCULAR Aorta: Normal caliber aorta without aneurysm, dissection, vasculitis or significant stenosis. Extensive atherosclerotic vascular calcifications. 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 The right hepatic artery is replaced to the SMA. SMA: Focal calcified atherosclerotic plaque in the proximal SMA results in moderate to high-grade stenosis. The right hepatic artery is replaced to the SMA. Renals: There are 2 right-sided renal arteries and single left-sided renal artery. All 3 renal arteries are small in caliber with extensive atherosclerotic plaque and likely significant stenoses. The native kidneys are atrophic. IMA: Patent without evidence of aneurysm, dissection, vasculitis or significant stenosis. Inflow: Patent without evidence of aneurysm, dissection, vasculitis or significant stenosis. Proximal Outflow: Bilateral common femoral and visualized portions of the superficial and profunda femoral arteries are patent without evidence of aneurysm, dissection, vasculitis or significant stenosis. Veins: No focal venous abnormality. Review of the MIP images confirms the above findings. NON-VASCULAR Lower chest: Mild cardiomegaly. Moderate left and small right layering pleural effusions. Dependent atelectasis in both lower lobes. Mild patchy consolidative airspace opacities in a peribronchovascular distribution in the right lower lobe suggests mild aspiration. Hepatobiliary: No focal  liver abnormality is seen. No gallstones, gallbladder wall thickening, or  biliary dilatation. Pancreas: Stable 1.7 cm circumscribed low-attenuation lesion at the junction of the pancreatic body and tail. No significant interval change compared to prior imaging from 12/14/2021. Confirmed is a simple cyst on prior MRI imaging. Spleen: Normal in size without focal abnormality. Adrenals/Urinary Tract: Normal adrenal glands. Atrophied native kidneys. Stable appearance of circumscribed water attenuation cystic lesions in the kidneys consistent with simple cysts. No imaging follow-up is recommended. Ureters and bladder are unremarkable. Stomach/Bowel: No focal bowel wall thickening or evidence of obstruction. Lymphatic: No suspicious lymphadenopathy. Reproductive: Low-attenuation fluid within the endometrial canal in the uterine fundus as previously seen. No adnexal mass.Stable ovoid soft tissue density present at the posterolateral margin of the vulva measures approximately 2.9 x 1.9 cm. This is essentially unchanged dating back to August of 2023. This may represent a complex proteinaceous Bartholin's gland cyst. Given 1 year of stability, it is unlikely to be of clinical significance. Other: Severe pelvic floor laxity. Musculoskeletal: No acute fracture or aggressive appearing lytic or blastic osseous lesion. IMPRESSION: VASCULAR 1. 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 2. 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 3. Extensive atherosclerotic calcifications throughout the abdominal aorta without aneurysm or dissection. 4. Heavily diseased and atrophied bilateral renal arteries. 5. Mild cardiomegaly. NON-VASCULAR 1. No acute abnormality within the abdomen or pelvis. 2. Patchy airspace opacities in the right lower lobe are favored to reflect small volume aspiration changes. 3. Small right and moderate left layering pleural effusions. 4. Advanced pelvic floor  laxity. 5. Numerous chronic findings as enumerated above without significant interval change compared to prior imaging. Aortic Atherosclerosis (ICD10-I70.0). Signed, Sterling Big, MD, RPVI [facility] Electronically Signed   By: Malachy Moan M.D.   On: 05/18/2023 06:32   DG Chest 1 View  Result Date: 05/17/2023 CLINICAL DATA:  Status post right thoracentesis. EXAM: CHEST  1 VIEW COMPARISON:  Chest x-ray dated May 13, 2023. FINDINGS: Unchanged tunneled right internal jugular dialysis catheter. Stable cardiomegaly and mild diffuse interstitial thickening. Resolved right pleural effusion after thoracentesis. No pneumothorax. Improved aeration of the right lung base. Unchanged small left pleural effusion and left basilar atelectasis. No acute osseous abnormality. IMPRESSION: 1. Resolved right pleural effusion after thoracentesis. No pneumothorax. 2. Unchanged small left pleural effusion and left basilar atelectasis. Electronically Signed   By: Obie Dredge M.D.   On: 05/17/2023 12:32   US THORACENTESIS ASP PLEURAL SPACE W/IMG GUIDE  Result Date: 05/17/2023 INDICATION: Pleural effusion.  Previous left thoracentesis 2 weeks ago. EXAM: ULTRASOUND GUIDED DIAGNOSTIC AND THERAPEUTIC RIGHT THORACENTESIS MEDICATIONS: 10 mL 1% lidocaine. COMPLICATIONS: None immediate. PROCEDURE: An ultrasound guided thoracentesis was thoroughly discussed with the patient and questions answered. The benefits, risks, alternatives and complications were also discussed. The patient understands and wishes to proceed with the procedure. Written consent was obtained. Ultrasound was performed to localize and mark an adequate pocket of fluid in the right chest. The area was then prepped and draped in the normal sterile fashion. 1% Lidocaine was used for local anesthesia. Under ultrasound guidance a 6 Fr Safe-T-Centesis catheter was introduced. Thoracentesis was  performed. The catheter was removed and a dressing applied. FINDINGS: A total of approximately 750 mL of clear yellow fluid was removed. Samples were sent to the laboratory as requested by the clinical team. IMPRESSION: Successful ultrasound guided right  thoracentesis yielding 750 mL of pleural fluid. Electronically Signed   By: Obie Dredge M.D.   On: 05/17/2023 10:44   CT ABDOMEN PELVIS WO CONTRAST  Result Date: 05/13/2023 CLINICAL DATA:  Acute abdominal pain with nausea and vomiting for several days EXAM: CT ABDOMEN AND PELVIS WITHOUT CONTRAST TECHNIQUE: Multidetector CT imaging of the abdomen and pelvis was performed following the standard protocol without IV contrast. RADIATION DOSE REDUCTION: This exam was performed according to the departmental dose-optimization program which includes automated exposure control, adjustment of the mA and/or kV according to patient size and/or use of iterative reconstruction technique. COMPARISON:  09/15/2022 FINDINGS: Lower chest: Moderate to large pleural effusions are noted bilaterally with associated lower lobe consolidation increased in the interval from the prior exam. Hepatobiliary: Liver is within normal limits. Gallbladder is well distended with a single dependent gallstone. Pancreas: Stable cystic lesion is noted within the pancreatic body unchanged from the recent exam. Follow-up as previously described. Spleen: Normal in size without focal abnormality. Adrenals/Urinary Tract: Adrenal glands are within normal limits. Kidneys are well visualized bilaterally with diffuse renal vascular calcifications. No definitive calculi are seen. No obstructive changes are noted. Bladder is decompressed. Stomach/Bowel: No obstructive or inflammatory changes of the colon are noted. The appendix is not well visualized consistent with prior surgical history. Small bowel and stomach are within normal limits. Vascular/Lymphatic: Aortic atherosclerosis. No enlarged abdominal or  pelvic lymph nodes. Reproductive: Uterus and bilateral adnexa are unremarkable. Other: No abdominal wall hernia or abnormality. No abdominopelvic ascites. Musculoskeletal: Degenerative changes are noted without acute abnormality. IMPRESSION: Increased bilateral pleural effusions with lower lobe consolidation when compare with the prior exam. Chronic changes as described. Electronically Signed   By: Alcide Clever M.D.   On: 05/13/2023 21:35   DG Chest 2 View  Result Date: 05/13/2023 CLINICAL DATA:  Chest pain. EXAM: CHEST - 2 VIEW COMPARISON:  05/01/2023. FINDINGS: Mild-to-moderate pulmonary vascular congestion noted. There are bilateral small-to-moderate pleural effusions, increased since the prior study. Bilateral lung fields are otherwise clear. Evaluation of cardiomediastinal silhouette is nondiagnostic due to bilateral lower hemithorax opacification. No acute osseous abnormalities. The soft tissues are within normal limits. Right IJ hemodialysis catheter noted with its tip overlying the cavoatrial junction region. IMPRESSION: *Mild-to-moderate pulmonary vascular congestion. Small to moderate bilateral pleural effusions, increased since the prior study. Electronically Signed   By: Jules Schick M.D.   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 Request made for diagnostic and therapeutic left thoracentesis. EXAM: ULTRASOUND GUIDED LEFT THORACENTESIS MEDICATIONS: 10 mL 1% lidocaine COMPLICATIONS: None immediate. PROCEDURE: An ultrasound guided thoracentesis was thoroughly discussed with the patient and questions answered. The benefits, risks, alternatives and complications were also discussed. The patient understands and wishes to proceed with the procedure. Written consent was obtained. Ultrasound  was performed to localize and mark an adequate pocket of fluid within the left chest. The area was then prepped and draped in the normal sterile fashion. 1% Lidocaine was used for local anesthesia. Under ultrasound guidance a 6 Fr Safe-T-Centesis catheter was introduced. Thoracentesis was performed. The catheter was removed and a dressing applied. FINDINGS: A total of approximately 800 mL of yellow fluid was removed. Samples were sent to the laboratory as requested by the clinical team. IMPRESSION: Successful ultrasound guided left thoracentesis yielding 800 mL  of pleural fluid. Performed by: Loyce Dys PA-C Electronically Signed   By: Simonne Come M.D.   On: 05/01/2023 12:35   DG Chest 1 View  Result Date: 05/01/2023 CLINICAL DATA:  Status post left thoracentesis EXAM: CHEST  1 VIEW COMPARISON:  04/30/2023 FINDINGS: Substantial reduction in size of the left pleural effusion. Continued obscuration of the left heart border compatible with some residual pleural effusion. At least a small right pleural effusion noted. Interstitial accentuation in the lungs compatible with interstitial edema. Atherosclerotic calcification of the aortic arch. No pneumothorax. Bony demineralization noted. IMPRESSION: 1. Substantial reduction in size of the left pleural effusion, without pneumothorax. 2. Interstitial edema and at least a small right pleural effusion. 3. Bony demineralization. Electronically Signed   By: Gaylyn Rong M.D.   On: 05/01/2023 10:21   DG CHEST PORT 1 VIEW  Result Date: 04/30/2023 CLINICAL DATA:  Pleural effusion. EXAM: PORTABLE CHEST 1 VIEW COMPARISON:  Chest radiograph 02/10/2023 FINDINGS: The left heart border is partially obscured, however the cardiac silhouette again appears enlarged. Aortic atherosclerosis is noted. There is a moderate left pleural effusion which has substantially enlarged from the prior study with associated atelectasis or consolidation in the left lung base and mid lung.  There is a small right pleural effusion which has decreased in size. The interstitial markings are less prominent than on the prior study without current evidence of pulmonary edema. No pneumothorax is identified. No acute osseous abnormality is seen. IMPRESSION: 1. Enlarged, moderate left pleural effusion with associated left mid and lower lung atelectasis or consolidation. 2. Decreased, small right pleural effusion. Electronically Signed   By: Sebastian Ache M.D.   On: 04/30/2023 13:35    Assessment  82 y.o. female with a history of ESRD on dialysis, HTN, HLD, gastroparesis, anxiety/depression, GERD, gouty arthritis, chronic respiratory failure on supplemental O2, CHF and anemia who presented to the ED 10/21 with 3 days of abdominal pain, nausea, vomiting, diarrhea. GI consulted given ongoing N/V, abdominal pain and diarrhea not resolved by discontinuation of antibiotics.   Diarrhea resolved with Reglan discontinued. CTA with focal moderate to high-grade stenosis in proximal SMA, mild to moderate stenosis in proximal celiac artery. Findings discussed by Dr. Marletta Lor with IR (Dr. Archer Asa) who recommends outpatient follow-up for evaluation.   Dysphagia: underwent EGD on 10/27 with small hiatal hernia, mild Schatzki ring s/p dilation, gastritis s/p biopsy  She has been somnolent over the weekend, with CT head without contrast without acute abnormalities. Stable atrophy and white matter disease, chronic left sphenoid sinus disease, left mastoid effusion. She also had developed bloody discharge from left ear per notes. Unasyn started. Suspect this could be driving her decreased desire for oral intake and hopefully will improve with antibiotics. From GI standpoint, could consider resuming Reglan if she has any signs of inability to advance diet. Appreciate speech following.    Plan / Recommendations  Will need IR evaluation as outpatient for possible chronic mesenteric ischemia  PPI BID Appreciate speech  following: when able to advance diet, recommend D1/puree with thin liquids, small sips, 1:1 feeder assist WHEN more alert Will follow peripherally    LOS: 7 days    05/20/2023, 9:40 AM  Gelene Mink, PhD, ANP-BC [facility]

## 2023-05-20 NOTE — Progress Notes (Signed)
   05/20/23 1000  Spiritual Encounters  Type of Visit Initial  Care provided to: Family  Reason for visit Routine spiritual support  OnCall Visit No   [place] visited with family member of the patient today. I expected to complete the advanced directive but [place] was not able to understand the process today so it will be readdressed when she is able to understand what she is signing. I spent time with her daughter today who was struggling to understand what is happening with her mother. I listened as she expressed surprise and grief seeing her mother in her present state. I encouraged her to ask questions to help with understanding.    Trish [employer] [phone]

## 2023-05-20 NOTE — Progress Notes (Addendum)
Palliative:  HPI:  82 y.o. female  with past medical history of ESRD on HD MWF, HTN, combined systolic and diastolic CHF, chronic 2L oxygen, gastroparesis, gout admitted on 05/13/2023 with abd pain, nausea, vomiting, diarrhea thought related to doxycycline vs gastroparesis.   I met today at Ms. Jurich's bedside along with daughter, Derrill Memo and I reviewed her mother's significant changes over the weekend. I had a good conversation with Ms. Osu Friday midmorning and she was completely with it and aware and able to clearly express her wishes. I reviewed conversation and Ms. Gaustad wishes with Pam at this time. Pam is not surprised by her mother's wishes for DNR status and understands her desire not to be prolonged by artificial nutrition. However, Pam would entertain temporary artificial nutrition if there is a possibility of improvement. There would need to be more discussion.   Seems that Ms. Butt became more and more confused Friday after HD and was given Ativan 1 mg IV Saturday early am. She underwent EGD Sunday under general anesthesia. She has had ongoing encephalopathy since Friday evening per family. Plans for CT head today and hopeful that HD today can help eliminate medication that is adding to lethargy. Pam requests respiratory panel to rule out COVID/flu given her mother's severe headache - discussed with Dr. Jarvis Newcomer. Will give one time dose IV Tylenol to see if this helps to avoid further sedating medications such as dilaudid and Tylenol suppository not effective. Pam also enquires about antibiotic coverage for potential UTI or pneumonia - Dr. Jarvis Newcomer to manage.   Pam also shares that someone came in and mentioned hospice to her. She believes that they were talking about the wrong patient. I reassured Pam that I did not discuss hospice with her mother Friday and had no plans to discuss this today. We reviewed plan to continue work up and treatment. I reassured her that if we feel that  we are nearing the point of needing help from hospice we will sit down and have a discussion about that. Pam expresses understanding. I did spend time discussing the difficulty of heart failure especially in the setting of dialysis. We discussed declining quality of life and that there may be a time when dialysis is not tolerated or not a good option for Ms. Champeau.Pam expresses understanding.   All questions/concerns addressed to best of my ability. Emotional support provided. Discussed with Dr. Jarvis Newcomer and RN.   Exam: Lethargic. Attempts to speak but mumbles and difficult to understand. Covers eyes and rubs with discomfort. Grimace. Thin, frail. Breathing regular, unlabored but poor airway protection of secretions. Abd soft. Moves all extremities. Warm to touch.   Plan: - DNR in place - Ongoing workup and treatment - Time for outcomes  50 min  Yong Channel, NP Palliative Medicine Team Pager [phone] (Please see [email or web address] for schedule) Team Phone [phone]    Greater than 50%  of this time was spent counseling and coordinating care related to the above assessment and plan

## 2023-05-20 NOTE — Progress Notes (Addendum)
  HEMODIALYSIS TREATMENT NOTE:   Arrived to [facility] transported by Radiology Dept staff after having a stat head CT.  Pt has a cool wet washcloth draped over her eyes and she is lightly moaning with every exhalation.  Will not answer orientation questions.  Will not allow me to remove washcloth.  She withdraws when I try to apply BP cuff to her wrist and states "it hurts, do it on my leg."  As I apply local telemetry leads she asks "where are you going to stick me?"  I clarify no plans to "stick" anywhere, that we will use her catheter for dialysis.  I ask if she knows where she is or who I am (as I have dialyzed her many times before and she always conversant about her daughter, son, and great-grand-daughter).  She does not answer but states that she wants to go home.  127/68, HR 61, R 24, and spO2 100% on 2L.   HD was initiated via [facility].  Within the first 5 minutes, Tablo required cartridge/dialyzer change d/t air drawn into circuit r/t art line collapse.  Unable to return blood - EBL 300 ml.  Hgb 11.5 today - will trend.  Shortly after tx was resumed Havery Moros SLP arrived to do swallow study.  Pt was more interactive with Dabney, who noticed blood-tinged purulent drainage from pt's left ear (see media).  Pt was assessed by Dr. Jarvis Newcomer, ampicillin was ordered - deferred to primary nurse as drug is removed by HD.  BP soft, Albumin given once. Dialysate was cooled to 35 degrees, as ordered.  3 hour session completed.  Only 1 liter removed.  All blood was returned.  Post-HD:  05/20/23 1600  Vitals  Temp 97.8 F (36.6 C)  Temp Source Axillary  BP (!) 143/56  MAP (mmHg) 81  BP Location Right Leg  BP Method Automatic  Patient Position (if appropriate) Lying  Pulse Rate 78  Pulse Rate Source Monitor  ECG Heart Rate 80  Resp 16  Oxygen Therapy  SpO2 100 %  O2 Device Nasal Cannula  O2 Flow Rate (L/min) 2 L/min  Post Treatment  Dialyzer Clearance Lightly streaked  Hemodialysis Intake (mL) 100 mL   Liters Processed 54.3  Fluid Removed (mL) 1000 mL  Tolerated HD Treatment No (Comment)  Post-Hemodialysis Comments See PN  Hemodialysis Catheter Right Subclavian Double lumen Permanent (Tunneled)  No placement date or time found.   Placed prior to admission: Yes  Orientation: Right  Access Location: Subclavian  Hemodialysis Catheter Type: Double lumen Permanent (Tunneled)  Blue Lumen Status Flushed;Heparin locked;Dead end cap in place  Red Lumen Status Flushed;Heparin locked;Dead end cap in place  Purple Lumen Status N/A  Catheter fill solution Heparin 1000 units/ml  Catheter fill volume (Arterial) 1.9 cc  Catheter fill volume (Venous) 1.9  Dressing Type Transparent;Tube stabilization device  Dressing Status Clean, Dry, Intact;Antimicrobial disc in place  Drainage Description None  Dressing Change Due 05/26/23  Post treatment catheter status Capped and Clamped    Arman Filter, RN AP [facility]

## 2023-05-20 NOTE — Progress Notes (Signed)
Hypoglycemic Event  CBG: 38 CV  Treatment: D50 50 mL (25 gm)  Symptoms: Nervous/irritable and Lethargic   and drowsy   Follow-up CBG: Time:655 CBG Result:207  Possible Reasons for Event: Other: NPO   Comments/MD notified: Charge nurse notified; Dr. paged    Sundra Aland

## 2023-05-20 NOTE — Progress Notes (Addendum)
[facility] Progress Note   Assessment/ Plan:   Outpatient Dialysis Orders: Center: [name] MWF  3h  400/500   2K/2.5Ca bath  Heparin 1000 bolus + 1200u/hr  RIJ TDC - EDW 42kg IVPN with HD Micera 100 mcg q2 weeks (was due 05/13/23) Venofer 50 mg IV once per week (was due 05/13/23)   Assessment/Plan:  Abdominal pain/N/V/D - CT scan without obstruction or inflammatory changes to bowels.  Did have single gallstone and stable pancreatic cyst.  Gi following. S/p EGD 10/27with gastritis, small hiatal hernia, mild schatzki ring. May need follow up with IR for possible mesenteric ischemia  ESRD -  HD on MWF schedule. HD today  Hypertension/volume  - has evidence of volume overload, however she is only 0.6kg above edw.  Will challenge edw given HTN and pulm edema/effusions. UF as tolerated, new EDW on d/c to be reestablished  Anemia  - Hgb 11.5, no indication for ESA  Metabolic bone disease -   PO4 16.1. Not on binders, starting renvela 2 tabs tidac-can given when able to take PO  Nutrition - per primary svc  Combined systolic and diastolic CHF - as above UF as tolerated.  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 Karin Lieu.  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  Amylase and lipase were WNL. Per primary Unresponsive/lethargy: possibly will need a CTH to rule out a stroke at the very least especially seeing her vascular findings on CTA. Possibly related to hypoglycemia +/- dilaudid as well? Will defer to primary for w/u and mgmt. Will see if HD today helps. Currently NPO Hypoglycemia: currently NPO, per primary  Discussed with overnight RN, daughter at the bedside. Discussed with primary service.  Anthony Sar, MD [facility]     Subjective:    Patient seen and examined bedside.  Daughter at bedside, discussed withRN as well at the bedside. Daughter reports that patient has been lethargic and unresponsive for a couple of days, has been like this since receiving ativan. Was found to be hypoglycemic this AM and did receive dilaudid overnight.   Objective:   BP (!) 155/54 (BP Location: Left Arm)   Pulse 67   Temp 98 F (36.7 C)   Resp 18   Wt 38.3 kg   SpO2 94%   BMI 17.05 kg/m   Physical Exam: Gen: lethargic, ill appearing CVS: RRR Resp: clear Abd: soft, NT/ND Ext: no LE edema Neuro: lethargic, unable to arouse with vocal or tactile stimuli, intermittently moving extremities (purposefully) ACCESS: [facility] and AVG + bruit with staples  Labs: BMET Recent Labs  Lab 05/15/23 1311 05/15/23 1340 05/16/23 0506 05/17/23 0417 05/18/23 0533 05/19/23 0458 05/20/23 0419  NA 130* 130* 132* 132* 132* 133* 135  K 3.9 3.9 4.4 4.8 3.9 4.4 5.2*  CL 94* 92* 93* 95* 94* 95* 96*  CO2 20* 24 24 21* 26 21* 16*  GLUCOSE 109* 90 78 75 101* 63* 38*  BUN 47* 48* 59* 73* 29* 44* 61*  CREATININE 4.76* 4.85* 6.15* 7.27* 4.13* 5.83* 7.53*  CALCIUM 7.9* 7.9* 7.8* 8.0* 7.7* 8.1* 8.2*  PHOS 6.8* 6.9* 8.9* 9.7* 5.7* 7.8* 10.6*   CBC Recent Labs  Lab 05/13/23 1325 05/15/23 0224 05/17/23 0417 05/20/23 0419  WBC 6.0 8.1 4.9 10.9*  HGB 12.9 10.8*  10.5* 11.5*  HCT 40.7 33.7* 34.1* 38.5  MCV 85.9 84.0 87.4 89.1  PLT 209 243 158 162      Medications:     carvedilol  12.5 mg Oral BID   Chlorhexidine Gluconate Cloth  6 each Topical Q0600   Gerhardt's butt cream   Topical TID   hydrALAZINE  50 mg Oral BID   pantoprazole (PROTONIX) IV  40 mg Intravenous Q24H

## 2023-05-20 NOTE — Progress Notes (Signed)
Speech Language Pathology Treatment: Dysphagia  Patient Details Name: Felicia Williams MRN: [id number] DOB: 02/14/1941 Today's Date: 05/20/2023 Time: 4098-1191 SLP Time Calculation (min) (ACUTE ONLY): 21 min  Assessment / Plan / Recommendation Clinical Impression  SLP spoke with daughter in Pt's room, as Pt was in dialysis. Daughter indicates that Pt still with mental status change since Friday and reports that she is reporting eye and ear pain. SLP saw Pt while in dialysis and Pt confirmed head, neck and ear pain. SLP completed OME and discovered what appears to be blood tinged discharge from her left ear (see picture that Felicia Williams added to chart). She reports tenderness to touch to both ears and neck. Pleased see head CT from this AM regarding left ear effusion. She was agreeable to ice chip trials, which she tolerated well with verbal cues and progressed to a few bites of puree and sips of water. Limited trials presented due to sub optimal positioning for dialysis, however Pt with episode of hypoglycemia earlier today due to NPO status. Recommend D1/puree with thin liquids via small sips with 1:1 feeder assist when Pt is alert and upright, ok for PO medications crushed in puree, assist for oral care. Pt had EGD yesterday with dilatation. SLP will continue to follow during acute stay.    HPI HPI: Felicia Williams is an 82 y.o. 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 CT abdomen pelvis without contrast showed increased bilateral  pleural effusions with lower lobe consolidation when compared with the prior exam. She was admitted, doxycycline course has been completed, gastroparesis treated with reglan though GI symptoms continue. GI consult requested. Pleural effusion was  drained 10/25 without significant change in respiratory status. Pt had EGD on 05/19/23 which showed: Small hiatal hernia. - Mild Schatzki ring. Dilated. - Gastritis. Biopsied. - Normal duodenal bulb, first portion of the duodenum and second portion of the duodenum. BSE completed after EGD, however Pt with reduced alertness.      SLP Plan  Continue with current plan of care      Recommendations for follow up therapy are one component of a multi-disciplinary discharge planning process, led by the attending physician.  Recommendations may be updated based on patient status, additional functional criteria and insurance authorization.    Recommendations  Diet recommendations: Dysphagia 1 (puree);Thin liquid Liquids provided via: Cup;Teaspoon Medication Administration: Crushed with puree Supervision: Staff to assist with self feeding;Full supervision/cueing for compensatory strategies Compensations: Slow rate;Small sips/bites Postural Changes and/or Swallow Maneuvers: Seated upright 90 degrees;Upright 30-60 min after meal                  Oral care QID   Intermittent Supervision/Assistance Dysphagia, unspecified (R13.10)     Continue with current plan of care     Felicia Williams  05/20/2023, 1:10 PM

## 2023-05-20 NOTE — Plan of Care (Signed)
 °  Problem: Clinical Measurements: °Goal: Respiratory complications will improve °Outcome: Progressing °Goal: Cardiovascular complication will be avoided °Outcome: Progressing °  °Problem: Activity: °Goal: Risk for activity intolerance will decrease °Outcome: Progressing °  °Problem: Coping: °Goal: Level of anxiety will decrease °Outcome: Progressing °  °Problem: Pain Managment: °Goal: General experience of comfort will improve °Outcome: Progressing °  °

## 2023-05-20 NOTE — Plan of Care (Signed)
°  Problem: Coping: °Goal: Level of anxiety will decrease °Outcome: Progressing °  °

## 2023-05-21 ENCOUNTER — Inpatient Hospital Stay (HOSPITAL_COMMUNITY): Payer: Medicare Other

## 2023-05-21 DIAGNOSIS — Z515 Encounter for palliative care: Secondary | ICD-10-CM | POA: Diagnosis not present

## 2023-05-21 DIAGNOSIS — J189 Pneumonia, unspecified organism: Secondary | ICD-10-CM

## 2023-05-21 DIAGNOSIS — N186 End stage renal disease: Secondary | ICD-10-CM | POA: Diagnosis not present

## 2023-05-21 DIAGNOSIS — I5042 Chronic combined systolic (congestive) and diastolic (congestive) heart failure: Secondary | ICD-10-CM | POA: Diagnosis not present

## 2023-05-21 DIAGNOSIS — K551 Chronic vascular disorders of intestine: Secondary | ICD-10-CM | POA: Diagnosis not present

## 2023-05-21 DIAGNOSIS — R1013 Epigastric pain: Secondary | ICD-10-CM | POA: Diagnosis not present

## 2023-05-21 DIAGNOSIS — Z7189 Other specified counseling: Secondary | ICD-10-CM | POA: Diagnosis not present

## 2023-05-21 LAB — RENAL FUNCTION PANEL
Albumin: 2.3 g/dL — ABNORMAL LOW (ref 3.5–5.0)
Anion gap: 12 (ref 5–15)
BUN: 27 mg/dL — ABNORMAL HIGH (ref 8–23)
CO2: 24 mmol/L (ref 22–32)
Calcium: 8.2 mg/dL — ABNORMAL LOW (ref 8.9–10.3)
Chloride: 98 mmol/L (ref 98–111)
Creatinine, Ser: 4.14 mg/dL — ABNORMAL HIGH (ref 0.44–1.00)
GFR, Estimated: 10 mL/min — ABNORMAL LOW (ref >60)
Glucose, Bld: 78 mg/dL (ref 70–99)
Phosphorus: 4.7 mg/dL — ABNORMAL HIGH (ref 2.5–4.6)
Potassium: 3.9 mmol/L (ref 3.5–5.1)
Sodium: 134 mmol/L — ABNORMAL LOW (ref 135–145)

## 2023-05-21 LAB — CBC
HCT: 31.1 % — ABNORMAL LOW (ref 36.0–46.0)
Hemoglobin: 9.7 g/dL — ABNORMAL LOW (ref 12.0–15.0)
MCH: 27.2 pg (ref 26.0–34.0)
MCHC: 31.2 g/dL (ref 30.0–36.0)
MCV: 87.1 fL (ref 80.0–100.0)
Platelets: 139 10*3/uL — ABNORMAL LOW (ref 150–400)
RBC: 3.57 MIL/uL — ABNORMAL LOW (ref 3.87–5.11)
RDW: 17.9 % — ABNORMAL HIGH (ref 11.5–15.5)
WBC: 6.5 10*3/uL (ref 4.0–10.5)
nRBC: 0 % (ref 0.0–0.2)

## 2023-05-21 LAB — DIC (DISSEMINATED INTRAVASCULAR COAGULATION)PANEL
D-Dimer, Quant: 4.04 ug{FEU}/mL — ABNORMAL HIGH (ref 0.00–0.50)
Fibrinogen: 327 mg/dL (ref 210–475)
INR: 1.3 — ABNORMAL HIGH (ref 0.8–1.2)
Platelets: 139 10*3/uL — ABNORMAL LOW (ref 150–400)
Prothrombin Time: 16.4 s — ABNORMAL HIGH (ref 11.4–15.2)
Smear Review: NONE SEEN
aPTT: 34 s (ref 24–36)

## 2023-05-21 LAB — SURGICAL PATHOLOGY

## 2023-05-21 LAB — LACTIC ACID, PLASMA: Lactic Acid, Venous: 1.6 mmol/L (ref 0.5–1.9)

## 2023-05-21 MED ORDER — VANCOMYCIN HCL 500 MG/100ML IV SOLN
500.0000 mg | INTRAVENOUS | Status: DC
  Administered 2023-05-22: 500 mg via INTRAVENOUS
  Filled 2023-05-21 (×2): qty 100

## 2023-05-21 MED ORDER — VANCOMYCIN HCL 750 MG/150ML IV SOLN
750.0000 mg | Freq: Once | INTRAVENOUS | Status: AC
  Administered 2023-05-21: 750 mg via INTRAVENOUS
  Filled 2023-05-21: qty 150

## 2023-05-21 MED ORDER — IOHEXOL 300 MG/ML  SOLN
125.0000 mL | Freq: Once | INTRAMUSCULAR | Status: AC | PRN
Start: 2023-05-21 — End: 2023-05-21
  Administered 2023-05-21: 125 mL via INTRAVENOUS

## 2023-05-21 MED ORDER — ERYTHROMYCIN 5 MG/GM OP OINT
TOPICAL_OINTMENT | Freq: Four times a day (QID) | OPHTHALMIC | Status: DC
  Administered 2023-05-21 – 2023-05-23 (×5): 1 via OPHTHALMIC
  Filled 2023-05-21: qty 3.5

## 2023-05-21 NOTE — Progress Notes (Addendum)
Pt had fever overnight and treated with Tylenol suppository x 2. At beginning of this RN's shift last night she has copious amounts of secretions. After Robinul and Scopalomine patch patient was able to rest with less secretions present. Dainage in both ears overnight. Daughter remained at bedside all night and is supportive in care. Kellogg RN

## 2023-05-21 NOTE — Progress Notes (Signed)
  Echocardiogram 2D Echocardiogram has been attempted. Introduced myself to patient and said I was going to look at her heart, she replied "and I'm gonna look at your ass". Was unable to place EKG stickers on chest, will attempt later.  Leda Roys RDCS 05/21/2023, 9:28 AM

## 2023-05-21 NOTE — Progress Notes (Signed)
Palliative:  HPI:  82 y.o. female  with past medical history of ESRD on HD MWF, HTN, combined systolic and diastolic CHF, chronic 2L oxygen, gastroparesis, gout admitted on 05/13/2023 with abd pain, nausea, vomiting, diarrhea thought related to doxycycline vs gastroparesis.   I discussed ongoing decline and work-up with Dr. Jarvis Newcomer as well as RN. I met today with daughter, Felicia Williams, and we reviewed progression as well as labs/tests completed. Discussed pneumonia and sepsis with ongoing work up. Strange progression and decline with a variety of complicating symptoms and issues. I discussed with Felicia Williams further her mother's condition and path forward. We discussed overall quality of life and level of suffering currently. We reviewed plan of action if she were to continue to decline. Felicia Williams upholds her mother's wishes for DNR status and avoiding any invasive measures unless thought to have a high probability to help her improve. Felicia Williams would NOT want escalation to ICU or vasopressor support. Felicia Williams shares that her mother has overall declining QOL and multiple times she thought she may not make it over the past year. Low threshold for focus on comfort care if she were to worsen despite antibiotic treatment. Hopes for improvement with antibiotics but Felicia Williams is accepting and willing to continue conversation based on her mother's progression and risks vs benefits of focus on care. Felicia Williams tells me that she is preparing herself for any outcome.   Update: Returned to bedside and joined by Dr. Glendon Axe discussed CT neck and abd/pelvis without acute issues. Plans for continued antibiotics and treatment with antibiotics. Son, Felicia Williams, brings up nutrition and feeding. No plans for artificial feeding per my previous conversation and further discussed risks vs benefits of artificial nutrition with Cortrak, TPN, PEG. No plans to pursue artificial nutrition today. Will revisit goals of care and progress tomorrow.   All questions/concerns addressed.  Emotional support provided.   Exam: Lethargic, responds at times but mumbles and difficult to understand. Grimace and restless - appears overall uncomfortable. Thin, frail. Breathing regular, unlabored. Abd flat - did not assess with tenderness/discomfort with palpation noted by others.   Plan: - DNR - Continue treatment with medications - No desire to escalate to ICU level care or vasopressors - Ongoing goals of care conversations  65 min  Felicia Channel, NP Palliative Medicine Team Pager [phone] (Please see [email or web address] for schedule) Team Phone [phone]    Greater than 50%  of this time was spent counseling and coordinating care related to the above assessment and plan

## 2023-05-21 NOTE — Progress Notes (Signed)
[facility] PROGRESS NOTE  Barara Coggeshall (DOB: November 10, 1940) XBJ:[id number] PCP: Rebekah Chesterfield, NP  Brief Narrative: Felicia Williams is an 82 y.o. 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    CT abdomen pelvis without contrast showed increased bilateral  pleural effusions with lower lobe consolidation when compared with the prior exam. Protonix was given, Reglan was given.  Lokelma due to hyperkalemia was also given. Hospitalist was asked to admit patient for further evaluation and management.  She was admitted, doxycycline course has been completed, gastroparesis treated with reglan though GI symptoms continue. GI consult requested. Pleural effusion was drained 10/25 without significant change in respiratory status.   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   CT head showed no acute intracranial abnormalities, but did show left mastoid effusion. This correlates to tenderness on exam and patient confirms pain in her ears, developed bloody discharged from left ear on 10/28. Unasyn was started.   She developed a fever, bleeding from the nose, and worsening headache/neck ache   Subjective: Headache this morning, but drowsy and does not volunteer any history. Later in the day does confirm that her hearing has been poor and she has pain in her left ear. In HD she is more alert. I saw her several times today. Hypoglycemic this morning resolved with po intake.  Objective: BP 137/61 (BP Location: Right Leg)   Pulse 77   Temp 99.1 F (37.3 C)   Resp (!) 22   Wt 39.7 kg   SpO2 97%   BMI 17.68 kg/m   Gen: Elderly frail female in apparent discomfort HEENT: Pt refused to open eyes, when eyelids opened there is crusting shut  and significant erythema and conjunctival injection. Pupils constricted but reactive and symmetric. There is bloody discharge from both ears with canal edema limiting visualization. Pulm: Diminished at right base, crackles noted, no wheezes. Tachypneic with normal effort  CV: RRR, no tachycardia, no MRG, no edema or JVD GI: Pt nonverbal but responds violently to palpation diffusely with significant rigidity thereafter, a change from prior exams.  Neuro: Aware but only yelling at examiner and everyone else to stop messing with her, won't answer directed questions. She moves all extremities and facial expressions are symmetric, no dysarthria. Limited exam of neck, but she does move it side to side and Kernig sign is negative.  Ext: Warm, no deformities Skin: TDC in right chest appears normal without erythema or discharge. Left arm AVG looks clean, dry, no tenderness, +distal thrill stable. Her forehead developed nodular erythematous eruption last night which has improved markedly since. Her fingertips have blood blisters bilaterally, no Osler nodes noted and no splinter hemorrhages noted.   EGD 10/27 Dr. Marletta Lor:  Impression:        - Small hiatal hernia.                           - Mild Schatzki ring. Dilated.                           - Gastritis. Biopsied.                           -  Normal duodenal bulb, first portion of the                            duodenum and second portion of the duodenum.  Recommendation: Return patient to hospital ward for ongoing care.                           - Soft diet.                           - Use a proton pump inhibitor PO BID.                           - Outpatient follow up with IR for possible                            mesenteric ischemia                           - May need to restart on metoclopramide for                            gastroparesis 5mg  TID  Assessment & Plan: Nausea, vomiting, abdominal pain: No acute findings on CT. Amylase, lipase wnl.   - Some symptoms attributed to doxycycline, though, still remained after discontinuation. 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 IR follow up recommended for what could be culprit lesion (SMA) for chronic mesenteric ischemia.  - Repeat CT abd/pelvis today due to rigidity with exam is reassuring. - EGD 10/27 Dr. Marletta Lor showed Schatzki's ring which was dilated and gastritis which was biopsied. Follow up biopsies.   - Continue prn zofran.  - Continue PPI BID.  - Could restart metoclopramide 5mg  TID if symptoms persist per GI. - Limit narcotics (though these are chronic and cannot be stopped)  Left mastoiditis, mastoid effusion:  - Continue unasyn  - Pt has ENT referral already in place to [facility], had NP visits 9/5 and 9/12 for impacted cerumen of right ear canal that is not present at this time.  - Exam difficult due to patient agitation, though palpable firmness tracking down left neck prompted CT soft tissue which shows no parapharyngeal/deep space fluid collections.   Fever, leukocytosis, suspected HCAP: Patchy multifocal infiltrates noted on CT.  - Continue unasyn as above, add vancomycin.  - Note exam remains difficult due to pt cooperation, but if meningismus developed, would need to consider lumbar puncture.   Choking episode:  - Would keep her NPO while still drowsy (had ativan previously and now anesthesia care during EGD). SLP evaluation requested, pt is more alert, dysphagia diet ordered.  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 LVEF showed global hypokinesis with LVEF 35-40%, G1DD  - Continue monitoring clinically with serial dialysis hoping to take fluid off. Repeat thoracentesis this admission 10/25 consistent with transudate, negative gram  stain, will follow culture and cytology. This did not have significant impact on patient's subjective respiratory status nor oxygenation.  - Defer cardiology evaluation to outpatient setting as long as no anginal  symptoms appear.   ESRD:  - Nephrology following, continue HD per routine.   Hyperkalemia: Resolved. Tx with dialysis.  Acute encephalopathy: ?Delirium vs. mild uremia. B12 replete, ammonia normal. Onset after ativan argues for metabolic insult, again received anesthesia care and is drowsy afterward.  - Continue monitoring closely. CT showed no acute intracranial abnormalities 10/28. - Prefer lowest possible dose of medications, e.g. ativan 1mg  caused long lasting sedation. - Palliative care is following.   HTN:  - Continue coreg, hydralazine.   Left sacral stage 2 pressure injury: POA.  - Offload, optimize nutrition.   History of infected AVG: s/p excision, placement of artegraft by Dr. Karin Lieu.  - Completed doxycycline 10/23. Staples in place though appears to be healing well. If still admitted 10/31, would let [facility] know as she has follow up that day, may remove staples at that time.   DNR: Status confirmed with palliative consultation with patient on 10/25. Gold form in chart.   Weakness: This continues to be significant.  - Will continue getting up as much as tolerated, PT/OT efforts appreciated. Family concerned at her current level of functioning, may need rehabilitation prior to returning home.  Tyrone Nine, MD [facility] [email or web address] 05/21/2023, 5:41 PM

## 2023-05-21 NOTE — Progress Notes (Signed)
   05/21/23 1541  Spiritual Encounters  Type of Visit Follow up  Care provided to: Pt and family  Conversation partners present during encounter Other (comment) Child psychotherapist)  Referral source Chaplain team  Reason for visit Advance directives  OnCall Visit No  Interventions  Spiritual Care Interventions Made Compassionate presence;Reflective listening;Narrative/life review;Prayer  Intervention Outcomes  Outcomes Connection to spiritual care;Awareness of support   Chaplain responding to follow-up from chaplain team member for an Advance Directive ready to be signed.  Upon arrival Pt was non-responsive to the family in the room and writhing I a great deal of pain. Both PA and I agreed that we would not be able to complete the ACD right now.  Does not seem likely that Pt's condition will improve to the point of making the ACD notary process possible. Chaplain worked to process with family the events leading up the Pt's current condition.  Through compassionate presence and reflective listening, chaplain sought to support family and develop a relationship of care and concern. In speaking with the PA, I was introduced to Pt's daughter Elita Quick and chaplain spoke with her at length regarding her mother's illness and other difficulties daughter is currently wrestling with.  Chaplain provided compassionate listening, encouraging words, and the ritual of prayer when asked for.  No further services necessary at this time.

## 2023-05-21 NOTE — Progress Notes (Signed)
Pharmacy Antibiotic Note  Felicia Williams is a 82 y.o. female admitted on 05/13/2023 with sepsis.  Pharmacy has been consulted for vancomycin dosing.  Plan: Vancomycin 750 mg IV x 1 dose. Vancomycin 500 mg IV every MWF w/ HD Unasyn 3000 mg IV every 24 hours. Monitor labs, c/s, and vanco levels as indicated.  Weight: 39.7 kg (87 lb 8.4 oz)  Temp (24hrs), Avg:99.4 F (37.4 C), Min:97.8 F (36.6 C), Max:101.4 F (38.6 C)  Recent Labs  Lab 05/15/23 0224 05/15/23 1311 05/17/23 0417 05/18/23 0533 05/19/23 0458 05/20/23 0419 05/21/23 0423  WBC 8.1  --  4.9  --   --  10.9* 6.5  CREATININE 10.29*   < > 7.27* 4.13* 5.83* 7.53* 4.14*   < > = values in this interval not displayed.    Estimated Creatinine Clearance: 6.5 mL/min (A) (by C-G formula based on SCr of 4.14 mg/dL (H)).    Allergies  Allergen Reactions   Cefepime Other (See Comments)    Feb 2023 Encephalopathy with questionable seizures. Seems to be tolerating cefazolin , but the daughter mentioned not to start her on any cephalosporin     Morphine Other (See Comments)    "Dry heaving like crazy"   Sulfa Antibiotics Other (See Comments)    Shut pt's kidneys down    Antimicrobials this admission: Vanco 10/29 >> Unasyn 10/28 >>  Microbiology results: 10/29 Bcx: pending 10/25 Pleural fluid Cx: ngtd  Thank you for allowing pharmacy to be a part of this patient's care.  Judeth Cornfield, PharmD Clinical Pharmacist 05/21/2023 10:05 AM

## 2023-05-21 NOTE — Progress Notes (Signed)
Brief nephrology note:  Goals of care discussion ongoing.  Holding off seeing patient today as per Dr. Raynelle Dick request.  We will continue to follow.

## 2023-05-22 ENCOUNTER — Encounter (HOSPITAL_COMMUNITY): Payer: Self-pay | Admitting: Internal Medicine

## 2023-05-22 ENCOUNTER — Inpatient Hospital Stay (HOSPITAL_COMMUNITY): Payer: Medicare Other

## 2023-05-22 ENCOUNTER — Other Ambulatory Visit (HOSPITAL_COMMUNITY): Payer: Self-pay | Admitting: *Deleted

## 2023-05-22 DIAGNOSIS — Z7189 Other specified counseling: Secondary | ICD-10-CM | POA: Diagnosis not present

## 2023-05-22 DIAGNOSIS — H7090 Unspecified mastoiditis, unspecified ear: Secondary | ICD-10-CM | POA: Diagnosis not present

## 2023-05-22 DIAGNOSIS — I38 Endocarditis, valve unspecified: Secondary | ICD-10-CM

## 2023-05-22 DIAGNOSIS — K551 Chronic vascular disorders of intestine: Secondary | ICD-10-CM | POA: Diagnosis not present

## 2023-05-22 DIAGNOSIS — N186 End stage renal disease: Secondary | ICD-10-CM | POA: Diagnosis not present

## 2023-05-22 DIAGNOSIS — I5042 Chronic combined systolic (congestive) and diastolic (congestive) heart failure: Secondary | ICD-10-CM | POA: Diagnosis not present

## 2023-05-22 DIAGNOSIS — Z515 Encounter for palliative care: Secondary | ICD-10-CM | POA: Diagnosis not present

## 2023-05-22 DIAGNOSIS — G9340 Encephalopathy, unspecified: Secondary | ICD-10-CM | POA: Diagnosis not present

## 2023-05-22 LAB — RENAL FUNCTION PANEL
Albumin: 2.1 g/dL — ABNORMAL LOW (ref 3.5–5.0)
Anion gap: 13 (ref 5–15)
BUN: 41 mg/dL — ABNORMAL HIGH (ref 8–23)
CO2: 25 mmol/L (ref 22–32)
Calcium: 8.2 mg/dL — ABNORMAL LOW (ref 8.9–10.3)
Chloride: 96 mmol/L — ABNORMAL LOW (ref 98–111)
Creatinine, Ser: 5.78 mg/dL — ABNORMAL HIGH (ref 0.44–1.00)
GFR, Estimated: 7 mL/min — ABNORMAL LOW (ref >60)
Glucose, Bld: 52 mg/dL — ABNORMAL LOW (ref 70–99)
Phosphorus: 6.2 mg/dL — ABNORMAL HIGH (ref 2.5–4.6)
Potassium: 3.9 mmol/L (ref 3.5–5.1)
Sodium: 134 mmol/L — ABNORMAL LOW (ref 135–145)

## 2023-05-22 LAB — ECHOCARDIOGRAM LIMITED
S' Lateral: 3.6 cm
Weight: 1262.79 [oz_av]

## 2023-05-22 LAB — CBC WITH DIFFERENTIAL/PLATELET
Abs Immature Granulocytes: 0.02 10*3/uL (ref 0.00–0.07)
Basophils Absolute: 0 10*3/uL (ref 0.0–0.1)
Basophils Relative: 1 %
Eosinophils Absolute: 0.1 10*3/uL (ref 0.0–0.5)
Eosinophils Relative: 2 %
HCT: 30.7 % — ABNORMAL LOW (ref 36.0–46.0)
Hemoglobin: 9.5 g/dL — ABNORMAL LOW (ref 12.0–15.0)
Immature Granulocytes: 0 %
Lymphocytes Relative: 11 %
Lymphs Abs: 0.5 10*3/uL — ABNORMAL LOW (ref 0.7–4.0)
MCH: 26.9 pg (ref 26.0–34.0)
MCHC: 30.9 g/dL (ref 30.0–36.0)
MCV: 87 fL (ref 80.0–100.0)
Monocytes Absolute: 0.3 10*3/uL (ref 0.1–1.0)
Monocytes Relative: 7 %
Neutro Abs: 3.8 10*3/uL (ref 1.7–7.7)
Neutrophils Relative %: 79 %
Platelets: 150 10*3/uL (ref 150–400)
RBC: 3.53 MIL/uL — ABNORMAL LOW (ref 3.87–5.11)
RDW: 18 % — ABNORMAL HIGH (ref 11.5–15.5)
WBC: 4.7 10*3/uL (ref 4.0–10.5)
nRBC: 0 % (ref 0.0–0.2)

## 2023-05-22 LAB — GLUCOSE, CAPILLARY
Glucose-Capillary: 182 mg/dL — ABNORMAL HIGH (ref 70–99)
Glucose-Capillary: 26 mg/dL — CL (ref 70–99)
Glucose-Capillary: 27 mg/dL — CL (ref 70–99)
Glucose-Capillary: 61 mg/dL — ABNORMAL LOW (ref 70–99)
Glucose-Capillary: 82 mg/dL (ref 70–99)

## 2023-05-22 LAB — CYTOLOGY - NON PAP

## 2023-05-22 MED ORDER — SODIUM CHLORIDE 0.9 % IV SOLN
2.2500 g | Freq: Three times a day (TID) | INTRAVENOUS | Status: DC
  Administered 2023-05-22 – 2023-05-23 (×2): 2.25 g via INTRAVENOUS
  Filled 2023-05-22 (×6): qty 10

## 2023-05-22 MED ORDER — CHLORHEXIDINE GLUCONATE CLOTH 2 % EX PADS
6.0000 | MEDICATED_PAD | Freq: Every day | CUTANEOUS | Status: DC
  Administered 2023-05-22 – 2023-05-23 (×2): 6 via TOPICAL

## 2023-05-22 MED ORDER — HEPARIN SODIUM (PORCINE) 1000 UNIT/ML IJ SOLN
INTRAMUSCULAR | Status: AC
  Filled 2023-05-22: qty 4

## 2023-05-22 MED ORDER — PIPERACILLIN-TAZOBACTAM IN DEX 2-0.25 GM/50ML IV SOLN
2.2500 g | Freq: Three times a day (TID) | INTRAVENOUS | Status: DC
  Filled 2023-05-22 (×3): qty 50

## 2023-05-22 MED ORDER — DEXTROSE 50 % IV SOLN
INTRAVENOUS | Status: AC
  Administered 2023-05-22: 50 mL
  Filled 2023-05-22: qty 50

## 2023-05-22 MED ORDER — HEPARIN SODIUM (PORCINE) 1000 UNIT/ML DIALYSIS
20.0000 [IU]/kg | INTRAMUSCULAR | Status: DC | PRN
Start: 2023-05-22 — End: 2023-05-23

## 2023-05-22 MED ORDER — ALBUMIN HUMAN 25 % IV SOLN
25.0000 g | Freq: Once | INTRAVENOUS | Status: AC
  Administered 2023-05-22: 25 g via INTRAVENOUS

## 2023-05-22 MED ORDER — DEXTROSE 50 % IV SOLN
50.0000 mL | Freq: Once | INTRAVENOUS | Status: AC
  Administered 2023-05-22: 50 mL via INTRAVENOUS
  Filled 2023-05-22: qty 50

## 2023-05-22 MED ORDER — PIPERACILLIN-TAZOBACTAM 3.375 G IVPB: 3.3750 g | Freq: Three times a day (TID) | INTRAVENOUS | Status: DC

## 2023-05-22 NOTE — Progress Notes (Signed)
  HEMODIALYSIS TREATMENT NOTE:  Pre-HD: eyes swollen shut, moaning / mumbling to self.  Acknowledges her name and says "Oh, God" when I tell her we're about to start dialysis.  124/40, HR 72, R 16, spO2 100% on 2L.    HD was initiated with a mere 500 ml goal.  BP began declining as soon as treatment started. Albumin was given within the first hour for BP 84/46.  Unable to tolerate even minimal UF after that.  HD was discontinued after 3 hours after d/w Dr. Ronalee Belts. Vancomycin 500mg  given during last hour. All blood was returned. Net +270 ml.  Post-HD:  05/22/23 1930  Vitals  Temp 97.9 F (36.6 C)  Temp Source Axillary  BP (!) 105/40  MAP (mmHg) (!) 62  BP Location Right Wrist  BP Method Automatic  Patient Position (if appropriate) Lying  Pulse Rate 70  Pulse Rate Source Monitor  ECG Heart Rate 72  Resp 18  Oxygen Therapy  SpO2 100 %  O2 Device Nasal Cannula  O2 Flow Rate (L/min) 2 L/min  Post Treatment  Dialyzer Clearance Lightly streaked  Hemodialysis Intake (mL) 200 mL  Liters Processed 61.1  Fluid Removed (mL) 0 mL (+270 ml)  Tolerated HD Treatment No (Comment)  Post-Hemodialysis Comments See PN -  Hemodialysis Catheter Right Subclavian Double lumen Permanent (Tunneled)  No placement date or time found.   Placed prior to admission: Yes  Orientation: Right  Access Location: Subclavian  Hemodialysis Catheter Type: Double lumen Permanent (Tunneled)  Blue Lumen Status Flushed;Heparin locked;Dead end cap in place  Red Lumen Status Flushed;Heparin locked;Dead end cap in place  Purple Lumen Status N/A  Catheter fill solution Heparin 1000 units/ml  Catheter fill volume (Arterial) 1.9 cc  Catheter fill volume (Venous) 1.9  Dressing Type Transparent;Tube stabilization device  Dressing Status Antimicrobial disc in place;Clean, Dry, Intact  Interventions Dressing reinforced  Drainage Description None  Dressing Change Due 05/27/23  Post treatment catheter status Capped and  Clamped   Arman Filter, RN AP [facility]

## 2023-05-22 NOTE — Progress Notes (Addendum)
2) w Reflex to ID Panel     Status: None (Preliminary result)   Collection Time: 05/21/23  9:41 AM   Specimen: BLOOD RIGHT FOREARM  Result Value Ref Range Status   Specimen Description BLOOD RIGHT FOREARM  Final   Special Requests   Final    BOTTLES DRAWN AEROBIC ONLY Blood Culture adequate volume Performed at [facility], [address], [place], [place] [address]    Culture PENDING  Incomplete   Report Status PENDING  Incomplete     Scheduled Meds:  Chlorhexidine Gluconate Cloth  6 each Topical Q0600   Chlorhexidine Gluconate Cloth  6 each Topical Q0600   erythromycin   Both Eyes Q6H   Gerhardt's butt cream   Topical TID   pantoprazole (PROTONIX) IV  40 mg Intravenous Q24H   scopolamine  1 patch Transdermal Q72H   sevelamer carbonate  1,600 mg Oral TID WC   triamcinolone   Topical TID   Continuous Infusions:  anticoagulant sodium citrate     piperacillin-tazobactam (ZOSYN)  IV     piperacillin-tazobactam (ZOSYN)  IV     vancomycin      Procedures/Studies: ECHOCARDIOGRAM LIMITED  Result Date: 05/22/2023    ECHOCARDIOGRAM LIMITED REPORT   Patient Name:   Felicia Williams Date of Exam: 05/22/2023 Medical Rec #:  [id number]        Height:       59.0 in Accession #:    [id number]       Weight:       78.9 lb Date of Birth:  31-Jan-1941        BSA:           1.242 m Patient Age:    82 years         BP:           125/87 mmHg Patient Gender: F                HR:           84 bpm. [facility] Procedure: Limited Echo Indications:    Endocarditis I38  History:        Patient has prior history of Echocardiogram examinations, most                 recent 02/11/2023. Risk Factors:Hypertension and Dyslipidemia. Hx                 of ESRD, Chronic combined systolic and diastolic CHF (congestive                 heart failure), Elevated troponin, BNP and Pleural effusion.  Sonographer:    Celesta Gentile RCS Referring Phys: [phone] [place] IMPRESSIONS  1. Limited study.  2. Left ventricular ejection fraction, by estimation, is 50 to 55%. The left ventricle has low normal function. The left ventricle demonstrates global hypokinesis. There is mild concentric left ventricular hypertrophy.  3. Right ventricular systolic function is normal. The right ventricular size is normal. Tricuspid regurgitation signal is inadequate for assessing PA pressure.  4. Left atrial size was mildly dilated.  5. Catheter tip visualized in right atrium.  6. The mitral valve is degenerative. Mild mitral valve regurgitation.  7. The aortic valve is tricuspid. There is mild calcification of the aortic valve. Aortic valve regurgitation is trivial.  8. The inferior vena cava is normal in size with greater than 50% respiratory variability, suggesting right atrial pressure of 3 mmHg.  9. No obvious valvular  2) w Reflex to ID Panel     Status: None (Preliminary result)   Collection Time: 05/21/23  9:41 AM   Specimen: BLOOD RIGHT FOREARM  Result Value Ref Range Status   Specimen Description BLOOD RIGHT FOREARM  Final   Special Requests   Final    BOTTLES DRAWN AEROBIC ONLY Blood Culture adequate volume Performed at [facility], [address], [place], [place] [address]    Culture PENDING  Incomplete   Report Status PENDING  Incomplete     Scheduled Meds:  Chlorhexidine Gluconate Cloth  6 each Topical Q0600   Chlorhexidine Gluconate Cloth  6 each Topical Q0600   erythromycin   Both Eyes Q6H   Gerhardt's butt cream   Topical TID   pantoprazole (PROTONIX) IV  40 mg Intravenous Q24H   scopolamine  1 patch Transdermal Q72H   sevelamer carbonate  1,600 mg Oral TID WC   triamcinolone   Topical TID   Continuous Infusions:  anticoagulant sodium citrate     piperacillin-tazobactam (ZOSYN)  IV     piperacillin-tazobactam (ZOSYN)  IV     vancomycin      Procedures/Studies: ECHOCARDIOGRAM LIMITED  Result Date: 05/22/2023    ECHOCARDIOGRAM LIMITED REPORT   Patient Name:   Felicia Williams Date of Exam: 05/22/2023 Medical Rec #:  [id number]        Height:       59.0 in Accession #:    [id number]       Weight:       78.9 lb Date of Birth:  31-Jan-1941        BSA:           1.242 m Patient Age:    82 years         BP:           125/87 mmHg Patient Gender: F                HR:           84 bpm. [facility] Procedure: Limited Echo Indications:    Endocarditis I38  History:        Patient has prior history of Echocardiogram examinations, most                 recent 02/11/2023. Risk Factors:Hypertension and Dyslipidemia. Hx                 of ESRD, Chronic combined systolic and diastolic CHF (congestive                 heart failure), Elevated troponin, BNP and Pleural effusion.  Sonographer:    Celesta Gentile RCS Referring Phys: [phone] [place] Nine IMPRESSIONS  1. Limited study.  2. Left ventricular ejection fraction, by estimation, is 50 to 55%. The left ventricle has low normal function. The left ventricle demonstrates global hypokinesis. There is mild concentric left ventricular hypertrophy.  3. Right ventricular systolic function is normal. The right ventricular size is normal. Tricuspid regurgitation signal is inadequate for assessing PA pressure.  4. Left atrial size was mildly dilated.  5. Catheter tip visualized in right atrium.  6. The mitral valve is degenerative. Mild mitral valve regurgitation.  7. The aortic valve is tricuspid. There is mild calcification of the aortic valve. Aortic valve regurgitation is trivial.  8. The inferior vena cava is normal in size with greater than 50% respiratory variability, suggesting right atrial pressure of 3 mmHg.  9. No obvious valvular  2) w Reflex to ID Panel     Status: None (Preliminary result)   Collection Time: 05/21/23  9:41 AM   Specimen: BLOOD RIGHT FOREARM  Result Value Ref Range Status   Specimen Description BLOOD RIGHT FOREARM  Final   Special Requests   Final    BOTTLES DRAWN AEROBIC ONLY Blood Culture adequate volume Performed at [facility], [address], [place], [place] [address]    Culture PENDING  Incomplete   Report Status PENDING  Incomplete     Scheduled Meds:  Chlorhexidine Gluconate Cloth  6 each Topical Q0600   Chlorhexidine Gluconate Cloth  6 each Topical Q0600   erythromycin   Both Eyes Q6H   Gerhardt's butt cream   Topical TID   pantoprazole (PROTONIX) IV  40 mg Intravenous Q24H   scopolamine  1 patch Transdermal Q72H   sevelamer carbonate  1,600 mg Oral TID WC   triamcinolone   Topical TID   Continuous Infusions:  anticoagulant sodium citrate     piperacillin-tazobactam (ZOSYN)  IV     piperacillin-tazobactam (ZOSYN)  IV     vancomycin      Procedures/Studies: ECHOCARDIOGRAM LIMITED  Result Date: 05/22/2023    ECHOCARDIOGRAM LIMITED REPORT   Patient Name:   Felicia Williams Date of Exam: 05/22/2023 Medical Rec #:  [id number]        Height:       59.0 in Accession #:    [id number]       Weight:       78.9 lb Date of Birth:  31-Jan-1941        BSA:           1.242 m Patient Age:    82 years         BP:           125/87 mmHg Patient Gender: F                HR:           84 bpm. [facility] Procedure: Limited Echo Indications:    Endocarditis I38  History:        Patient has prior history of Echocardiogram examinations, most                 recent 02/11/2023. Risk Factors:Hypertension and Dyslipidemia. Hx                 of ESRD, Chronic combined systolic and diastolic CHF (congestive                 heart failure), Elevated troponin, BNP and Pleural effusion.  Sonographer:    Celesta Gentile RCS Referring Phys: [phone] [place] Nine IMPRESSIONS  1. Limited study.  2. Left ventricular ejection fraction, by estimation, is 50 to 55%. The left ventricle has low normal function. The left ventricle demonstrates global hypokinesis. There is mild concentric left ventricular hypertrophy.  3. Right ventricular systolic function is normal. The right ventricular size is normal. Tricuspid regurgitation signal is inadequate for assessing PA pressure.  4. Left atrial size was mildly dilated.  5. Catheter tip visualized in right atrium.  6. The mitral valve is degenerative. Mild mitral valve regurgitation.  7. The aortic valve is tricuspid. There is mild calcification of the aortic valve. Aortic valve regurgitation is trivial.  8. The inferior vena cava is normal in size with greater than 50% respiratory variability, suggesting right atrial pressure of 3 mmHg.  9. No obvious valvular  2) w Reflex to ID Panel     Status: None (Preliminary result)   Collection Time: 05/21/23  9:41 AM   Specimen: BLOOD RIGHT FOREARM  Result Value Ref Range Status   Specimen Description BLOOD RIGHT FOREARM  Final   Special Requests   Final    BOTTLES DRAWN AEROBIC ONLY Blood Culture adequate volume Performed at [facility], [address], [place], [place] [address]    Culture PENDING  Incomplete   Report Status PENDING  Incomplete     Scheduled Meds:  Chlorhexidine Gluconate Cloth  6 each Topical Q0600   Chlorhexidine Gluconate Cloth  6 each Topical Q0600   erythromycin   Both Eyes Q6H   Gerhardt's butt cream   Topical TID   pantoprazole (PROTONIX) IV  40 mg Intravenous Q24H   scopolamine  1 patch Transdermal Q72H   sevelamer carbonate  1,600 mg Oral TID WC   triamcinolone   Topical TID   Continuous Infusions:  anticoagulant sodium citrate     piperacillin-tazobactam (ZOSYN)  IV     piperacillin-tazobactam (ZOSYN)  IV     vancomycin      Procedures/Studies: ECHOCARDIOGRAM LIMITED  Result Date: 05/22/2023    ECHOCARDIOGRAM LIMITED REPORT   Patient Name:   Felicia Williams Date of Exam: 05/22/2023 Medical Rec #:  [id number]        Height:       59.0 in Accession #:    [id number]       Weight:       78.9 lb Date of Birth:  31-Jan-1941        BSA:           1.242 m Patient Age:    82 years         BP:           125/87 mmHg Patient Gender: F                HR:           84 bpm. [facility] Procedure: Limited Echo Indications:    Endocarditis I38  History:        Patient has prior history of Echocardiogram examinations, most                 recent 02/11/2023. Risk Factors:Hypertension and Dyslipidemia. Hx                 of ESRD, Chronic combined systolic and diastolic CHF (congestive                 heart failure), Elevated troponin, BNP and Pleural effusion.  Sonographer:    Celesta Gentile RCS Referring Phys: [phone] [place] Nine IMPRESSIONS  1. Limited study.  2. Left ventricular ejection fraction, by estimation, is 50 to 55%. The left ventricle has low normal function. The left ventricle demonstrates global hypokinesis. There is mild concentric left ventricular hypertrophy.  3. Right ventricular systolic function is normal. The right ventricular size is normal. Tricuspid regurgitation signal is inadequate for assessing PA pressure.  4. Left atrial size was mildly dilated.  5. Catheter tip visualized in right atrium.  6. The mitral valve is degenerative. Mild mitral valve regurgitation.  7. The aortic valve is tricuspid. There is mild calcification of the aortic valve. Aortic valve regurgitation is trivial.  8. The inferior vena cava is normal in size with greater than 50% respiratory variability, suggesting right atrial pressure of 3 mmHg.  9. No obvious valvular  2) w Reflex to ID Panel     Status: None (Preliminary result)   Collection Time: 05/21/23  9:41 AM   Specimen: BLOOD RIGHT FOREARM  Result Value Ref Range Status   Specimen Description BLOOD RIGHT FOREARM  Final   Special Requests   Final    BOTTLES DRAWN AEROBIC ONLY Blood Culture adequate volume Performed at [facility], [address], [place], [place] [address]    Culture PENDING  Incomplete   Report Status PENDING  Incomplete     Scheduled Meds:  Chlorhexidine Gluconate Cloth  6 each Topical Q0600   Chlorhexidine Gluconate Cloth  6 each Topical Q0600   erythromycin   Both Eyes Q6H   Gerhardt's butt cream   Topical TID   pantoprazole (PROTONIX) IV  40 mg Intravenous Q24H   scopolamine  1 patch Transdermal Q72H   sevelamer carbonate  1,600 mg Oral TID WC   triamcinolone   Topical TID   Continuous Infusions:  anticoagulant sodium citrate     piperacillin-tazobactam (ZOSYN)  IV     piperacillin-tazobactam (ZOSYN)  IV     vancomycin      Procedures/Studies: ECHOCARDIOGRAM LIMITED  Result Date: 05/22/2023    ECHOCARDIOGRAM LIMITED REPORT   Patient Name:   Felicia Williams Date of Exam: 05/22/2023 Medical Rec #:  [id number]        Height:       59.0 in Accession #:    [id number]       Weight:       78.9 lb Date of Birth:  31-Jan-1941        BSA:           1.242 m Patient Age:    82 years         BP:           125/87 mmHg Patient Gender: F                HR:           84 bpm. [facility] Procedure: Limited Echo Indications:    Endocarditis I38  History:        Patient has prior history of Echocardiogram examinations, most                 recent 02/11/2023. Risk Factors:Hypertension and Dyslipidemia. Hx                 of ESRD, Chronic combined systolic and diastolic CHF (congestive                 heart failure), Elevated troponin, BNP and Pleural effusion.  Sonographer:    Celesta Gentile RCS Referring Phys: [phone] [place] IMPRESSIONS  1. Limited study.  2. Left ventricular ejection fraction, by estimation, is 50 to 55%. The left ventricle has low normal function. The left ventricle demonstrates global hypokinesis. There is mild concentric left ventricular hypertrophy.  3. Right ventricular systolic function is normal. The right ventricular size is normal. Tricuspid regurgitation signal is inadequate for assessing PA pressure.  4. Left atrial size was mildly dilated.  5. Catheter tip visualized in right atrium.  6. The mitral valve is degenerative. Mild mitral valve regurgitation.  7. The aortic valve is tricuspid. There is mild calcification of the aortic valve. Aortic valve regurgitation is trivial.  8. The inferior vena cava is normal in size with greater than 50% respiratory variability, suggesting right atrial pressure of 3 mmHg.  9. No obvious valvular  2) w Reflex to ID Panel     Status: None (Preliminary result)   Collection Time: 05/21/23  9:41 AM   Specimen: BLOOD RIGHT FOREARM  Result Value Ref Range Status   Specimen Description BLOOD RIGHT FOREARM  Final   Special Requests   Final    BOTTLES DRAWN AEROBIC ONLY Blood Culture adequate volume Performed at [facility], [address], [place], [place] [address]    Culture PENDING  Incomplete   Report Status PENDING  Incomplete     Scheduled Meds:  Chlorhexidine Gluconate Cloth  6 each Topical Q0600   Chlorhexidine Gluconate Cloth  6 each Topical Q0600   erythromycin   Both Eyes Q6H   Gerhardt's butt cream   Topical TID   pantoprazole (PROTONIX) IV  40 mg Intravenous Q24H   scopolamine  1 patch Transdermal Q72H   sevelamer carbonate  1,600 mg Oral TID WC   triamcinolone   Topical TID   Continuous Infusions:  anticoagulant sodium citrate     piperacillin-tazobactam (ZOSYN)  IV     piperacillin-tazobactam (ZOSYN)  IV     vancomycin      Procedures/Studies: ECHOCARDIOGRAM LIMITED  Result Date: 05/22/2023    ECHOCARDIOGRAM LIMITED REPORT   Patient Name:   Felicia Williams Date of Exam: 05/22/2023 Medical Rec #:  [id number]        Height:       59.0 in Accession #:    [id number]       Weight:       78.9 lb Date of Birth:  31-Jan-1941        BSA:           1.242 m Patient Age:    82 years         BP:           125/87 mmHg Patient Gender: F                HR:           84 bpm. [facility] Procedure: Limited Echo Indications:    Endocarditis I38  History:        Patient has prior history of Echocardiogram examinations, most                 recent 02/11/2023. Risk Factors:Hypertension and Dyslipidemia. Hx                 of ESRD, Chronic combined systolic and diastolic CHF (congestive                 heart failure), Elevated troponin, BNP and Pleural effusion.  Sonographer:    Celesta Gentile RCS Referring Phys: [phone] [place] IMPRESSIONS  1. Limited study.  2. Left ventricular ejection fraction, by estimation, is 50 to 55%. The left ventricle has low normal function. The left ventricle demonstrates global hypokinesis. There is mild concentric left ventricular hypertrophy.  3. Right ventricular systolic function is normal. The right ventricular size is normal. Tricuspid regurgitation signal is inadequate for assessing PA pressure.  4. Left atrial size was mildly dilated.  5. Catheter tip visualized in right atrium.  6. The mitral valve is degenerative. Mild mitral valve regurgitation.  7. The aortic valve is tricuspid. There is mild calcification of the aortic valve. Aortic valve regurgitation is trivial.  8. The inferior vena cava is normal in size with greater than 50% respiratory variability, suggesting right atrial pressure of 3 mmHg.  9. No obvious valvular  PROGRESS NOTE  Felicia Williams JXB:[id number] DOB: [birth year] DOA: 05/13/2023 PCP: Rebekah Chesterfield, NP  Brief History:  82 y.o. 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    CT abdomen pelvis without contrast showed small bilateral  pleural effusions with lower lobe consolidation when compared with the prior exam. Protonix was given, Reglan was given.  Lokelma due to hyperkalemia was also given. Hospitalist was asked to admit patient for further evaluation and management.   She was admitted, doxycycline course has been completed, gastroparesis treated with reglan though GI symptoms continue. GI consult requested. Pleural effusion was drained 10/25 without significant change in respiratory status.    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    CT head showed no acute intracranial abnormalities, but did show left mastoid effusion. This correlates to tenderness on exam and patient confirms pain in her ears, developed bloody discharged from left ear on 10/28. Unasyn was started.    She developed a fever, bleeding from the nose, and worsening headache.  She remained persistently febrile and encephalopathic.  Abx were broadened to include vanc and zosyn.   Assessment/Plan: Nausea, vomiting, abdominal pain: No acute findings on CT. Amylase, lipase wnl.  - Some symptoms attributed to doxycycline, though, still remained after discontinuation.  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 IR follow up recommended for what could be culprit lesion (SMA) for chronic mesenteric ischemia.  - Repeat CT abd/pelvis today due to rigidity with exam is reassuring. - EGD 10/27 Dr.  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  - Continue PPI BID.  - Could restart metoclopramide 5mg  TID if symptoms persist per GI. - Limit narcotics (though these are chronic and cannot be stopped)   Left mastoiditis, mastoid effusion:  - initiated unasyn 10/28 - 10/29 CT abd/pelvis--no acute finding.  Sm bilat eff. Endometrial thickening - Pt has ENT referral already in place to [facility], had NP visits 9/5 and 9/12 for impacted cerumen of right ear canal that is not present at this time.  - Exam difficult due to patient agitation, though palpable firmness tracking down left neck prompted CT soft tissue which shows no parapharyngeal/deep space fluid collections.    Fever, leukocytosis/lobar pneumonia - Continue unasyn as above, add vancomycin.  - Note exam remains difficult due to pt cooperation, but if meningismus developed, would need to consider lumbar puncture.  -10/29 CT neck--no neck mass/swelling; apical patchy consolidations -10/29 blood cultures neg   Choking episode:  - Would keep her NPO while still drowsy (had ativan previously and now anesthesia care during EGD). SLP evaluation requested, pt is more alert, dysphagia diet ordered.   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 LVEF showed global hypokinesis with LVEF 35-40%, G1DD  - Continue monitoring clinically with serial dialysis hoping to take fluid off.  -Repeat thoracentesis this admission 10/25 consistent with transudate, negative gram stain--culture neg and cytology neg -This did not have significant impact on patient's subjective respiratory status  2) w Reflex to ID Panel     Status: None (Preliminary result)   Collection Time: 05/21/23  9:41 AM   Specimen: BLOOD RIGHT FOREARM  Result Value Ref Range Status   Specimen Description BLOOD RIGHT FOREARM  Final   Special Requests   Final    BOTTLES DRAWN AEROBIC ONLY Blood Culture adequate volume Performed at [facility], [address], [place], [place] [address]    Culture PENDING  Incomplete   Report Status PENDING  Incomplete     Scheduled Meds:  Chlorhexidine Gluconate Cloth  6 each Topical Q0600   Chlorhexidine Gluconate Cloth  6 each Topical Q0600   erythromycin   Both Eyes Q6H   Gerhardt's butt cream   Topical TID   pantoprazole (PROTONIX) IV  40 mg Intravenous Q24H   scopolamine  1 patch Transdermal Q72H   sevelamer carbonate  1,600 mg Oral TID WC   triamcinolone   Topical TID   Continuous Infusions:  anticoagulant sodium citrate     piperacillin-tazobactam (ZOSYN)  IV     piperacillin-tazobactam (ZOSYN)  IV     vancomycin      Procedures/Studies: ECHOCARDIOGRAM LIMITED  Result Date: 05/22/2023    ECHOCARDIOGRAM LIMITED REPORT   Patient Name:   Felicia Williams Date of Exam: 05/22/2023 Medical Rec #:  [id number]        Height:       59.0 in Accession #:    [id number]       Weight:       78.9 lb Date of Birth:  31-Jan-1941        BSA:           1.242 m Patient Age:    82 years         BP:           125/87 mmHg Patient Gender: F                HR:           84 bpm. [facility] Procedure: Limited Echo Indications:    Endocarditis I38  History:        Patient has prior history of Echocardiogram examinations, most                 recent 02/11/2023. Risk Factors:Hypertension and Dyslipidemia. Hx                 of ESRD, Chronic combined systolic and diastolic CHF (congestive                 heart failure), Elevated troponin, BNP and Pleural effusion.  Sonographer:    Celesta Gentile RCS Referring Phys: [phone] [place] IMPRESSIONS  1. Limited study.  2. Left ventricular ejection fraction, by estimation, is 50 to 55%. The left ventricle has low normal function. The left ventricle demonstrates global hypokinesis. There is mild concentric left ventricular hypertrophy.  3. Right ventricular systolic function is normal. The right ventricular size is normal. Tricuspid regurgitation signal is inadequate for assessing PA pressure.  4. Left atrial size was mildly dilated.  5. Catheter tip visualized in right atrium.  6. The mitral valve is degenerative. Mild mitral valve regurgitation.  7. The aortic valve is tricuspid. There is mild calcification of the aortic valve. Aortic valve regurgitation is trivial.  8. The inferior vena cava is normal in size with greater than 50% respiratory variability, suggesting right atrial pressure of 3 mmHg.  9. No obvious valvular  PROGRESS NOTE  Felicia Williams JXB:[id number] DOB: [birth year] DOA: 05/13/2023 PCP: Rebekah Chesterfield, NP  Brief History:  82 y.o. 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    CT abdomen pelvis without contrast showed small bilateral  pleural effusions with lower lobe consolidation when compared with the prior exam. Protonix was given, Reglan was given.  Lokelma due to hyperkalemia was also given. Hospitalist was asked to admit patient for further evaluation and management.   She was admitted, doxycycline course has been completed, gastroparesis treated with reglan though GI symptoms continue. GI consult requested. Pleural effusion was drained 10/25 without significant change in respiratory status.    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    CT head showed no acute intracranial abnormalities, but did show left mastoid effusion. This correlates to tenderness on exam and patient confirms pain in her ears, developed bloody discharged from left ear on 10/28. Unasyn was started.    She developed a fever, bleeding from the nose, and worsening headache.  She remained persistently febrile and encephalopathic.  Abx were broadened to include vanc and zosyn.   Assessment/Plan: Nausea, vomiting, abdominal pain: No acute findings on CT. Amylase, lipase wnl.  - Some symptoms attributed to doxycycline, though, still remained after discontinuation.  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 IR follow up recommended for what could be culprit lesion (SMA) for chronic mesenteric ischemia.  - Repeat CT abd/pelvis today due to rigidity with exam is reassuring. - EGD 10/27 Dr.  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  - Continue PPI BID.  - Could restart metoclopramide 5mg  TID if symptoms persist per GI. - Limit narcotics (though these are chronic and cannot be stopped)   Left mastoiditis, mastoid effusion:  - initiated unasyn 10/28 - 10/29 CT abd/pelvis--no acute finding.  Sm bilat eff. Endometrial thickening - Pt has ENT referral already in place to [facility], had NP visits 9/5 and 9/12 for impacted cerumen of right ear canal that is not present at this time.  - Exam difficult due to patient agitation, though palpable firmness tracking down left neck prompted CT soft tissue which shows no parapharyngeal/deep space fluid collections.    Fever, leukocytosis/lobar pneumonia - Continue unasyn as above, add vancomycin.  - Note exam remains difficult due to pt cooperation, but if meningismus developed, would need to consider lumbar puncture.  -10/29 CT neck--no neck mass/swelling; apical patchy consolidations -10/29 blood cultures neg   Choking episode:  - Would keep her NPO while still drowsy (had ativan previously and now anesthesia care during EGD). SLP evaluation requested, pt is more alert, dysphagia diet ordered.   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 LVEF showed global hypokinesis with LVEF 35-40%, G1DD  - Continue monitoring clinically with serial dialysis hoping to take fluid off.  -Repeat thoracentesis this admission 10/25 consistent with transudate, negative gram stain--culture neg and cytology neg -This did not have significant impact on patient's subjective respiratory status  2) w Reflex to ID Panel     Status: None (Preliminary result)   Collection Time: 05/21/23  9:41 AM   Specimen: BLOOD RIGHT FOREARM  Result Value Ref Range Status   Specimen Description BLOOD RIGHT FOREARM  Final   Special Requests   Final    BOTTLES DRAWN AEROBIC ONLY Blood Culture adequate volume Performed at [facility], [address], [place], [place] [address]    Culture PENDING  Incomplete   Report Status PENDING  Incomplete     Scheduled Meds:  Chlorhexidine Gluconate Cloth  6 each Topical Q0600   Chlorhexidine Gluconate Cloth  6 each Topical Q0600   erythromycin   Both Eyes Q6H   Gerhardt's butt cream   Topical TID   pantoprazole (PROTONIX) IV  40 mg Intravenous Q24H   scopolamine  1 patch Transdermal Q72H   sevelamer carbonate  1,600 mg Oral TID WC   triamcinolone   Topical TID   Continuous Infusions:  anticoagulant sodium citrate     piperacillin-tazobactam (ZOSYN)  IV     piperacillin-tazobactam (ZOSYN)  IV     vancomycin      Procedures/Studies: ECHOCARDIOGRAM LIMITED  Result Date: 05/22/2023    ECHOCARDIOGRAM LIMITED REPORT   Patient Name:   Felicia Williams Date of Exam: 05/22/2023 Medical Rec #:  [id number]        Height:       59.0 in Accession #:    [id number]       Weight:       78.9 lb Date of Birth:  31-Jan-1941        BSA:           1.242 m Patient Age:    82 years         BP:           125/87 mmHg Patient Gender: F                HR:           84 bpm. [facility] Procedure: Limited Echo Indications:    Endocarditis I38  History:        Patient has prior history of Echocardiogram examinations, most                 recent 02/11/2023. Risk Factors:Hypertension and Dyslipidemia. Hx                 of ESRD, Chronic combined systolic and diastolic CHF (congestive                 heart failure), Elevated troponin, BNP and Pleural effusion.  Sonographer:    Celesta Gentile RCS Referring Phys: [phone] [place] IMPRESSIONS  1. Limited study.  2. Left ventricular ejection fraction, by estimation, is 50 to 55%. The left ventricle has low normal function. The left ventricle demonstrates global hypokinesis. There is mild concentric left ventricular hypertrophy.  3. Right ventricular systolic function is normal. The right ventricular size is normal. Tricuspid regurgitation signal is inadequate for assessing PA pressure.  4. Left atrial size was mildly dilated.  5. Catheter tip visualized in right atrium.  6. The mitral valve is degenerative. Mild mitral valve regurgitation.  7. The aortic valve is tricuspid. There is mild calcification of the aortic valve. Aortic valve regurgitation is trivial.  8. The inferior vena cava is normal in size with greater than 50% respiratory variability, suggesting right atrial pressure of 3 mmHg.  9. No obvious valvular

## 2023-05-22 NOTE — Progress Notes (Signed)
*  PRELIMINARY RESULTS* Echocardiogram Limited 2-D Echocardiogram  has been performed.  Felicia Williams 05/22/2023, 10:02 AM

## 2023-05-22 NOTE — Hospital Course (Signed)
82 y.o. 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    CT abdomen pelvis without contrast showed small bilateral  pleural effusions with lower lobe consolidation when compared with the prior exam. Protonix was given, Reglan was given.  Lokelma due to hyperkalemia was also given. Hospitalist was asked to admit patient for further evaluation and management.   She was admitted, doxycycline course has been completed, gastroparesis treated with reglan though GI symptoms continue. GI consult requested. Pleural effusion was drained 10/25 without significant change in respiratory status.    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    CT head showed no acute intracranial abnormalities, but did show left mastoid effusion. This correlates to tenderness on exam and patient confirms pain in her ears, developed bloody discharged from left ear on 10/28. Unasyn was started.    She developed a fever, bleeding from the nose, and worsening headache.  She remained persistently febrile and encephalopathic.  Abx were broadened to include vanc and zosyn.

## 2023-05-22 NOTE — Progress Notes (Signed)
[facility] NEPHROLOGY PROGRESS NOTE  Assessment/ Plan: Pt is a 82 y.o. yo female   Outpatient Dialysis Orders: Center: [name] MWF  3h  400/500   2K/2.5Ca bath  Heparin 1000 bolus + 1200u/hr  RIJ TDC - EDW 42kg IVPN with HD Micera 100 mcg q2 weeks (was due 05/13/23) Venofer 50 mg IV once per week (was due 05/13/23)  # Abdominal pain/N/V/D - CT scan without obstruction or inflammatory changes to bowels.  Did have single gallstone and stable pancreatic cyst.  Gi following. S/p EGD 10/27with gastritis, small hiatal hernia, mild schatzki ring. May need follow up with IR for possible mesenteric ischemia.  # ESRD -  HD on MWF schedule.  Seen by palliative care team.  Patient is DNR.  Plan for regular dialysis today.    # Hypertension/volume: On dysphagia diet but not eating.  Remains lethargic.  Need to adjust dry weight.  # Anemia: Hemoglobin 9.5 likely due to critical illness.  Monitor.  # CKD-Metabolic bone disease -continue binders when able to take orally well.  She is currently lethargic and somnolent.  # Combined systolic and diastolic CHF - as above UF as tolerated.  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 Karin Lieu.  It seems like completed antibiotics.  Now on vancomycin and Unasyn for other indication.  # Unresponsive/lethargy: CT head with no acute finding however stable atrophy and consistent with chronic microvascular ischemia.  On antibiotics for sepsis.  No change in mental status.  Palliative care is following.   I have discussed with the patient's daughter and palliative care team today.   Subjective: Seen and examined at bedside.  Getting echocardiogram.  Remains somnolent and lethargy.  Unable to obtain review of system.  Patient's daughter at the bedside. Objective Vital signs in last 24 hours: Vitals:   05/21/23 2035 05/21/23 2200 05/22/23 0140  05/22/23 0500  BP: 135/69 125/87    Pulse: 83 84    Resp: 16 16    Temp: (!) 101.1 F (38.4 C) (!) 101.4 F (38.6 C) 98.9 F (37.2 C)   TempSrc: Axillary Oral Axillary   SpO2: 100% 96%    Weight:    35.8 kg   Weight change: -4.4 kg  Intake/Output Summary (Last 24 hours) at 05/22/2023 0949 Last data filed at 05/22/2023 0200 Gross per 24 hour  Intake 256.12 ml  Output --  Net 256.12 ml       Labs: RENAL PANEL Recent Labs  Lab 05/18/23 0533 05/19/23 0458 05/20/23 0419 05/21/23 0423 05/22/23 0430  NA 132* 133* 135 134* 134*  K 3.9 4.4 5.2* 3.9 3.9  CL 94* 95* 96* 98 96*  CO2 26 21* 16* 24 25  GLUCOSE 101* 63* 38* 78 52*  BUN 29* 44* 61* 27* 41*  CREATININE 4.13* 5.83* 7.53* 4.14* 5.78*  CALCIUM 7.7* 8.1* 8.2* 8.2* 8.2*  PHOS 5.7* 7.8* 10.6* 4.7* 6.2*  ALBUMIN 2.1* 2.2* 2.2* 2.3* 2.1*    Liver Function Tests: Recent Labs  Lab 05/20/23 0419 05/21/23 0423 05/22/23 0430  ALBUMIN 2.2* 2.3* 2.1*   No results for input(s): "LIPASE", "AMYLASE" in the last 168 hours. Recent Labs  Lab 05/16/23 0506  AMMONIA 16   CBC: Recent Labs    07/12/22 0600 08/16/22 1437 02/02/23 4098 02/04/23 1627 02/11/23 0321 02/12/23 0448 05/15/23 0224 05/16/23 0506 05/17/23 0417 05/20/23 0419 05/21/23 0423 05/22/23 0430  HGB  --    < >  8.1*   < > 6.8*   < > 10.8*  --  10.5* 11.5* 9.7* 9.5*  MCV  --    < > 91.3   < > 92.7   < > 84.0  --  87.4 89.1 87.1 87.0  VITAMINB12  --   --  812  --   --   --   --  969*  --   --   --   --   FOLATE  --   --  14.6  --   --   --   --   --   --   --   --   --   FERRITIN 373*  --   --   --  286  --   --   --   --   --   --   --   TIBC 148*  --   --   --  118*  --   --   --   --   --   --   --   IRON 22*  --   --   --  21*  --   --   --   --   --   --   --    < > = values in this interval not displayed.    Cardiac Enzymes: No results for input(s): "CKTOTAL", "CKMB", "CKMBINDEX", "TROPONINI" in the last 168 hours. CBG: Recent Labs  Lab  05/15/23 1304 05/20/23 0655  GLUCAP 120* 207*    Iron Studies: No results for input(s): "IRON", "TIBC", "TRANSFERRIN", "FERRITIN" in the last 72 hours. Studies/Results: CT ABDOMEN PELVIS W CONTRAST  Result Date: 05/21/2023 CLINICAL DATA:  Persistent abdominal pain. EXAM: CT ABDOMEN AND PELVIS WITH CONTRAST TECHNIQUE: Multidetector CT imaging of the abdomen and pelvis was performed using the standard protocol following bolus administration of intravenous contrast. RADIATION DOSE REDUCTION: This exam was performed according to the departmental dose-optimization program which includes automated exposure control, adjustment of the mA and/or kV according to patient size and/or use of iterative reconstruction technique. CONTRAST:  OMNIPAQUE IOHEXOL 300 MG/ML  SOLN COMPARISON:  05/17/2023 FINDINGS: Lower Chest: No significant change in small bilateral pleural effusions and bilateral lower lobe atelectasis, left side greater than right. Hepatobiliary: No suspicious hepatic masses identified. Gallbladder contains some contrast but is otherwise unremarkable in appearance. No evidence of biliary obstruction. Pancreas: Stable 1.6 cm simple appearing cystic lesion in the pancreatic body, which may represent an indolent cystic neoplasm or pseudocyst. Spleen: Within normal limits in size and appearance. Adrenals/Urinary Tract: Stable bilateral renal parenchymal atrophy. No suspicious masses identified. No evidence of ureteral calculi or hydronephrosis. Urinary bladder is empty. Stomach/Bowel: No evidence of obstruction, inflammatory process or abnormal fluid collections. Vascular/Lymphatic: No pathologically enlarged lymph nodes. No acute vascular findings. Reproductive: Endometrial thickness measures 11 mm, which is abnormal for a postmenopausal female. No other pelvic masses identified. No evidence of inflammatory process or free fluid. Other:  None. Musculoskeletal:  No suspicious bone lesions identified.  IMPRESSION: No acute findings within the abdomen or pelvis. Endometrial thickness measures 11 mm, which is abnormal for a postmenopausal female. Recommend GYN consultation for consideration of endometrial sampling. Stable 1.6 cm simple appearing cystic lesion in pancreatic body, which may represent an indolent cystic neoplasm or pseudocyst. Recommend continued imaging follow-up in 2 years, preferably with MRI. This recommendation follows ACR consensus guidelines: Management of Incidental Pancreatic Cysts: A White Paper of the ACR Incidental Findings Committee. J Am Coll  Radiol 2017;14:911-923. Stable small bilateral pleural effusions and bilateral lower lobe atelectasis, left side greater than right. Electronically Signed   By: Danae Orleans M.D.   On: 05/21/2023 15:07   CT SOFT TISSUE NECK W CONTRAST  Result Date: 05/21/2023 CLINICAL DATA:  Soft tissue infection suspected, neck, xray done. EXAM: CT NECK WITH CONTRAST TECHNIQUE: Multidetector CT imaging of the neck was performed using the standard protocol following the bolus administration of intravenous contrast. RADIATION DOSE REDUCTION: This exam was performed according to the departmental dose-optimization program which includes automated exposure control, adjustment of the mA and/or kV according to patient size and/or use of iterative reconstruction technique. CONTRAST:  OMNIPAQUE IOHEXOL 300 MG/ML  SOLN COMPARISON:  None Available. FINDINGS: Pharynx and larynx: Normal. No mass or swelling. Salivary glands: No inflammation, mass, or stone. Thyroid: Unremarkable. Lymph nodes: No suspicious cervical lymphadenopathy. Vascular: Atherosclerotic calcifications of the carotid bulbs. Limited intracranial: Unremarkable. Visualized orbits: Unremarkable. Mastoids and visualized paranasal sinuses: Chronic left sphenoid sinusitis. Partial opacification of the bilateral mastoid air cells. Skeleton: Multilevel cervical spondylosis, worst at C4-5, where there is at  least mild spinal canal stenosis. No suspicious bone lesions. Upper chest: Patchy consolidation in the lung apices with associated small bilateral pleural effusions. Other: None. IMPRESSION: 1. No acute abnormality in the neck. 2. Patchy consolidation in the lung apices, suspicious for multifocal pneumonia, with associated small bilateral pleural effusions. 3. Chronic left sphenoid sinusitis. Electronically Signed   By: Orvan Falconer M.D.   On: 05/21/2023 13:20   CT HEAD WO CONTRAST ( )  Result Date: 05/20/2023 CLINICAL DATA:  Mental status change.  Unknown cause. EXAM: CT HEAD WITHOUT CONTRAST TECHNIQUE: Contiguous axial images were obtained from the base of the skull through the vertex without intravenous contrast. RADIATION DOSE REDUCTION: This exam was performed according to the departmental dose-optimization program which includes automated exposure control, adjustment of the mA and/or kV according to patient size and/or use of iterative reconstruction technique. COMPARISON:  CT head without contrast 02/02/2023. FINDINGS: Brain: No acute infarct, hemorrhage, or mass lesion is present. Mild atrophy and white matter changes are stable. Deep brain nuclei are within normal limits. The ventricles are of normal size. No significant extraaxial fluid collection is present. The brainstem and cerebellum are within normal limits. Midline structures are within normal limits. Vascular: Atherosclerotic calcifications are present within the cavernous internal carotid arteries bilaterally. Calcifications are present at the dural margin of both vertebral arteries. No focal asymmetric hyperdense vessel is present. Skull: Calvarium is intact. No focal lytic or blastic lesions are present. No significant extracranial soft tissue lesion is present. Sinuses/Orbits: Chronic opacification of the left sphenoid sinus is again noted. A left mastoid effusion is present. No obstructing nasopharyngeal lesion is present. The  paranasal sinuses and mastoid air cells are otherwise clear. Bilateral lens replacements are noted. Globes and orbits are otherwise unremarkable. IMPRESSION: 1. No acute intracranial abnormality or significant interval change. 2. Stable atrophy and white matter disease. This likely reflects the sequela of chronic microvascular ischemia. 3. Chronic left sphenoid sinus disease. 4. Left mastoid effusion. No obstructing nasopharyngeal lesion is present. Electronically Signed   By: Marin Roberts M.D.   On: 05/20/2023 12:13    Medications: Infusions:  ampicillin-sulbactam (UNASYN) IV 3 g (05/21/23 2226)   anticoagulant sodium citrate     vancomycin      Scheduled Medications:  Chlorhexidine Gluconate Cloth  6 each Topical Q0600   erythromycin   Both Eyes Q6H   Gerhardt's butt cream  Topical TID   pantoprazole (PROTONIX) IV  40 mg Intravenous Q24H   scopolamine  1 patch Transdermal Q72H   sevelamer carbonate  1,600 mg Oral TID WC   triamcinolone   Topical TID    have reviewed scheduled and prn medications.  Physical Exam: General: Ill looking female, somnolent and not responding Heart:RRR, s1s2 nl Lungs: Basal decreased breath sound, no wheezing. Abdomen:soft, Non-tender, non-distended Extremities:No peripheral edema Dialysis Access: TDC  Felicia Williams Collene Massimino 05/22/2023,9:49 AM  LOS: 9 days

## 2023-05-22 NOTE — Plan of Care (Signed)

## 2023-05-22 NOTE — Progress Notes (Signed)
SLP Cancellation Note  Patient Details Name: Felicia Williams MRN: [id number] DOB: 1941-03-06   Cancelled treatment:       Reason Eval/Treat Not Completed: Patient at procedure or test/unavailable. Pt was OTF. ST Will continue efforts,  Felicia Williams, CCC-SLP Speech Language Pathologist    Felicia Williams 05/22/2023, 4:50 PM

## 2023-05-22 NOTE — Progress Notes (Signed)
Palliative:  HPI:  82 y.o. female  with past medical history of ESRD on HD MWF, HTN, combined systolic and diastolic CHF, chronic 2L oxygen, gastroparesis, gout admitted on 05/13/2023 with abd pain, nausea, vomiting, diarrhea thought related to doxycycline vs gastroparesis.   I met today at Ms. Voyles's bedside along with daughter, Derrill Memo and I review her mother's status and progression. Pam shares that her mother reported that she felt "a little better" but not good. Ms. Dorko continues with significant lethargy and mumbles at times but difficult to understand. We reviewed ongoing seriously ill and poor intake. We spent time discussing Cortrak and artificial nutrition risks vs benefits. Ultimately we determined that we will hold off on Cortrak as Ms. Buff is unlikely to desire this. I worry she will not tolerate Cortrak placement and this will be painful and difficult for her. Pam will try and ask Ms. Rouse her wishes. We discussed making attempts for intake but risk of aspiration if she continues to be lethargic. Discussed potential of dextrose IVF although with underlying CHF/ ESRD - will discuss further with Dr. Arbutus Leas, Prognosis still concerning. We did discuss that the next challenge will be dialysis and if she can tolerate this. Pam understands that there is a good chance that this will be challenging for her mother. We reviewed labs and progress as no signs of significant worsening since yesterday but not really actual improvement either. Plans to continue conservative treatment and time for outcomes.   All questions/concerns addressed. Emotional support provided. Discussed with Dr. Arbutus Leas.   Exam: Lethargic. Mumbles and attempts to respond verbally. Thin, frail. Breathing regular, unlabored. Guarded and tense with grimacing. Abd flat, soft. Drainage from ears noted. Warm to touch.   Plan: - DNR/DNI - No escalation to ICU or vasopressors - Time for outcomes  55 min  Yong Channel,  NP Palliative Medicine Team Pager [phone] (Please see [email or web address] for schedule) Team Phone [phone]    Greater than 50%  of this time was spent counseling and coordinating care related to the above assessment and plan

## 2023-05-22 NOTE — Progress Notes (Signed)
  Nephrology Nursing Note:  Pt was returned to [facility] after HD.  Son and daughter were waiting in room.  They immediately asked "how'd she do on dialysis?"  I relayed how her low blood pressure is hindering our efforts to safely perform dialysis. I explained that I used Albumin for BP support, how it had minimal effect, and how its use in outpatient dialysis is not possible.  Furthermore, I explained that volume had been added, not removed with this treatment.  We talked about Felicia Williams many prior presentations with volume overload and how frightening it was for her, that she had once described it as "drowning, out of water."  I also shared that Felicia Williams and I have, over several prior admissions, discussed how much she "hates" dialysis and how it makes her feel.  She had once told me how much she loved her flower beds and how she no longer has energy to even water a flower basket.   I shared that we had had frank discussions that stopping dialysis was always an option. I recalled one time when I had asked Felicia Williams if she had talked to her family about this and she replied "oh, my kids know...if I say I'm done, I'm done."  Presently, Felicia Williams is not able to participate in this type of discussion.  Pt's daughter said "we have some decisions to make."  I offered that sometimes that decision is made for [place] if a pt is no longer able to safely tolerate treatment.  Further discussions to be continued.   Felicia Filter, RN

## 2023-05-23 ENCOUNTER — Encounter (HOSPITAL_COMMUNITY): Payer: Self-pay | Admitting: Internal Medicine

## 2023-05-23 ENCOUNTER — Telehealth: Payer: Self-pay | Admitting: Gastroenterology

## 2023-05-23 ENCOUNTER — Inpatient Hospital Stay (HOSPITAL_COMMUNITY)
Admission: AD | Admit: 2023-05-23 | Discharge: 2023-06-23 | DRG: 951 | Disposition: E | Source: Hospice | Attending: Internal Medicine | Admitting: Internal Medicine

## 2023-05-23 DIAGNOSIS — K551 Chronic vascular disorders of intestine: Secondary | ICD-10-CM | POA: Diagnosis present

## 2023-05-23 DIAGNOSIS — I5043 Acute on chronic combined systolic (congestive) and diastolic (congestive) heart failure: Secondary | ICD-10-CM | POA: Diagnosis present

## 2023-05-23 DIAGNOSIS — I2489 Other forms of acute ischemic heart disease: Secondary | ICD-10-CM | POA: Diagnosis present

## 2023-05-23 DIAGNOSIS — A419 Sepsis, unspecified organism: Secondary | ICD-10-CM | POA: Diagnosis present

## 2023-05-23 DIAGNOSIS — Z992 Dependence on renal dialysis: Secondary | ICD-10-CM

## 2023-05-23 DIAGNOSIS — G9341 Metabolic encephalopathy: Secondary | ICD-10-CM | POA: Diagnosis present

## 2023-05-23 DIAGNOSIS — N186 End stage renal disease: Secondary | ICD-10-CM | POA: Diagnosis present

## 2023-05-23 DIAGNOSIS — K219 Gastro-esophageal reflux disease without esophagitis: Secondary | ICD-10-CM | POA: Diagnosis present

## 2023-05-23 DIAGNOSIS — Z881 Allergy status to other antibiotic agents status: Secondary | ICD-10-CM

## 2023-05-23 DIAGNOSIS — M109 Gout, unspecified: Secondary | ICD-10-CM | POA: Diagnosis present

## 2023-05-23 DIAGNOSIS — H7092 Unspecified mastoiditis, left ear: Secondary | ICD-10-CM | POA: Diagnosis present

## 2023-05-23 DIAGNOSIS — J9611 Chronic respiratory failure with hypoxia: Secondary | ICD-10-CM | POA: Diagnosis present

## 2023-05-23 DIAGNOSIS — K3184 Gastroparesis: Secondary | ICD-10-CM | POA: Diagnosis present

## 2023-05-23 DIAGNOSIS — I6389 Other cerebral infarction: Secondary | ICD-10-CM | POA: Diagnosis present

## 2023-05-23 DIAGNOSIS — H7093 Unspecified mastoiditis, bilateral: Secondary | ICD-10-CM

## 2023-05-23 DIAGNOSIS — R109 Unspecified abdominal pain: Secondary | ICD-10-CM

## 2023-05-23 DIAGNOSIS — F32A Depression, unspecified: Secondary | ICD-10-CM | POA: Diagnosis present

## 2023-05-23 DIAGNOSIS — Z882 Allergy status to sulfonamides status: Secondary | ICD-10-CM

## 2023-05-23 DIAGNOSIS — E875 Hyperkalemia: Secondary | ICD-10-CM | POA: Diagnosis present

## 2023-05-23 DIAGNOSIS — J181 Lobar pneumonia, unspecified organism: Secondary | ICD-10-CM

## 2023-05-23 DIAGNOSIS — Z9981 Dependence on supplemental oxygen: Secondary | ICD-10-CM

## 2023-05-23 DIAGNOSIS — I132 Hypertensive heart and chronic kidney disease with heart failure and with stage 5 chronic kidney disease, or end stage renal disease: Secondary | ICD-10-CM | POA: Diagnosis present

## 2023-05-23 DIAGNOSIS — K2970 Gastritis, unspecified, without bleeding: Secondary | ICD-10-CM | POA: Diagnosis present

## 2023-05-23 DIAGNOSIS — Z66 Do not resuscitate: Secondary | ICD-10-CM | POA: Diagnosis present

## 2023-05-23 DIAGNOSIS — E782 Mixed hyperlipidemia: Secondary | ICD-10-CM | POA: Diagnosis present

## 2023-05-23 DIAGNOSIS — R040 Epistaxis: Secondary | ICD-10-CM | POA: Diagnosis not present

## 2023-05-23 DIAGNOSIS — Z8249 Family history of ischemic heart disease and other diseases of the circulatory system: Secondary | ICD-10-CM

## 2023-05-23 DIAGNOSIS — L89152 Pressure ulcer of sacral region, stage 2: Secondary | ICD-10-CM | POA: Diagnosis present

## 2023-05-23 DIAGNOSIS — D631 Anemia in chronic kidney disease: Secondary | ICD-10-CM | POA: Diagnosis present

## 2023-05-23 DIAGNOSIS — R1010 Upper abdominal pain, unspecified: Secondary | ICD-10-CM

## 2023-05-23 DIAGNOSIS — Z515 Encounter for palliative care: Principal | ICD-10-CM

## 2023-05-23 DIAGNOSIS — Z7189 Other specified counseling: Secondary | ICD-10-CM | POA: Diagnosis not present

## 2023-05-23 DIAGNOSIS — Z885 Allergy status to narcotic agent status: Secondary | ICD-10-CM

## 2023-05-23 LAB — RENAL FUNCTION PANEL
Albumin: 2.5 g/dL — ABNORMAL LOW (ref 3.5–5.0)
Anion gap: 11 (ref 5–15)
BUN: 21 mg/dL (ref 8–23)
CO2: 27 mmol/L (ref 22–32)
Calcium: 8.4 mg/dL — ABNORMAL LOW (ref 8.9–10.3)
Chloride: 97 mmol/L — ABNORMAL LOW (ref 98–111)
Creatinine, Ser: 3.3 mg/dL — ABNORMAL HIGH (ref 0.44–1.00)
GFR, Estimated: 13 mL/min — ABNORMAL LOW (ref >60)
Glucose, Bld: 117 mg/dL — ABNORMAL HIGH (ref 70–99)
Phosphorus: 3.3 mg/dL (ref 2.5–4.6)
Potassium: 3.5 mmol/L (ref 3.5–5.1)
Sodium: 135 mmol/L (ref 135–145)

## 2023-05-23 LAB — CULTURE, BODY FLUID W GRAM STAIN -BOTTLE: Culture: NO GROWTH

## 2023-05-23 LAB — GLUCOSE, CAPILLARY
Glucose-Capillary: 125 mg/dL — ABNORMAL HIGH (ref 70–99)
Glucose-Capillary: 70 mg/dL (ref 70–99)
Glucose-Capillary: 77 mg/dL (ref 70–99)

## 2023-05-23 LAB — MRSA NEXT GEN BY PCR, NASAL: MRSA by PCR Next Gen: NOT DETECTED

## 2023-05-23 MED ORDER — HYDROMORPHONE HCL 1 MG/ML IJ SOLN: 0.5000 mg | INTRAMUSCULAR | Status: DC | PRN

## 2023-05-23 MED ORDER — LORAZEPAM 2 MG/ML IJ SOLN
1.0000 mg | INTRAMUSCULAR | Status: DC | PRN
  Administered 2023-05-24: 1 mg via INTRAVENOUS
  Filled 2023-05-23: qty 1

## 2023-05-23 MED ORDER — ACETAMINOPHEN 325 MG PO TABS: 650.0000 mg | ORAL_TABLET | Freq: Four times a day (QID) | ORAL | Status: DC | PRN

## 2023-05-23 MED ORDER — HYDROMORPHONE HCL 1 MG/ML IJ SOLN
0.5000 mg | INTRAMUSCULAR | Status: DC | PRN
  Administered 2023-05-23 – 2023-05-24 (×4): 0.5 mg via INTRAVENOUS
  Filled 2023-05-23 (×4): qty 0.5

## 2023-05-23 MED ORDER — GLYCOPYRROLATE 1 MG PO TABS: 1.0000 mg | ORAL_TABLET | ORAL | Status: DC | PRN

## 2023-05-23 MED ORDER — GLYCOPYRROLATE 0.2 MG/ML IJ SOLN
0.2000 mg | INTRAMUSCULAR | Status: DC | PRN
  Administered 2023-05-24: 0.2 mg via INTRAVENOUS
  Filled 2023-05-23: qty 1

## 2023-05-23 MED ORDER — HALOPERIDOL LACTATE 5 MG/ML IJ SOLN: 0.5000 mg | INTRAMUSCULAR | Status: DC | PRN

## 2023-05-23 MED ORDER — ACETAMINOPHEN 650 MG RE SUPP
650.0000 mg | Freq: Four times a day (QID) | RECTAL | Status: DC | PRN
  Administered 2023-05-23: 650 mg via RECTAL
  Filled 2023-05-23: qty 1

## 2023-05-23 MED ORDER — LORAZEPAM 2 MG/ML IJ SOLN
1.0000 mg | INTRAMUSCULAR | Status: DC | PRN
  Administered 2023-05-23: 1 mg via INTRAVENOUS
  Filled 2023-05-23: qty 1

## 2023-05-23 MED ORDER — GLYCOPYRROLATE 0.2 MG/ML IJ SOLN
0.6000 mg | INTRAMUSCULAR | Status: DC
  Administered 2023-05-23 (×2): 0.6 mg via INTRAVENOUS
  Filled 2023-05-23: qty 3

## 2023-05-23 MED ORDER — POLYVINYL ALCOHOL 1.4 % OP SOLN: 1.0000 [drp] | Freq: Four times a day (QID) | OPHTHALMIC | Status: DC | PRN

## 2023-05-23 MED ORDER — ONDANSETRON 4 MG PO TBDP: 4.0000 mg | ORAL_TABLET | Freq: Four times a day (QID) | ORAL | Status: DC | PRN

## 2023-05-23 MED ORDER — GLYCOPYRROLATE 0.2 MG/ML IJ SOLN: 0.2000 mg | INTRAMUSCULAR | Status: DC | PRN

## 2023-05-23 MED ORDER — HALOPERIDOL 0.5 MG PO TABS: 0.5000 mg | ORAL_TABLET | ORAL | Status: DC | PRN

## 2023-05-23 MED ORDER — BIOTENE DRY MOUTH MT LIQD: 15.0000 mL | OROMUCOSAL | Status: DC | PRN

## 2023-05-23 MED ORDER — HALOPERIDOL LACTATE 2 MG/ML PO CONC: 0.5000 mg | ORAL | Status: DC | PRN

## 2023-05-23 MED ORDER — ONDANSETRON HCL 4 MG/2ML IJ SOLN: 4.0000 mg | Freq: Four times a day (QID) | INTRAMUSCULAR | Status: DC | PRN

## 2023-05-23 NOTE — Telephone Encounter (Signed)
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 Dr. Marletta Lor had spoken with Dr. Archer Asa during admission. She is still hospitalized currently.

## 2023-05-23 NOTE — Progress Notes (Signed)
[facility] NEPHROLOGY PROGRESS NOTE  Assessment/ Plan: Pt is a 82 y.o. yo female   Outpatient Dialysis Orders: Center: [name] MWF  3h  400/500   2K/2.5Ca bath  Heparin 1000 bolus + 1200u/hr  RIJ TDC - EDW 42kg IVPN with HD Micera 100 mcg q2 weeks (was due 05/13/23) Venofer 50 mg IV once per week (was due 05/13/23)  # Abdominal pain/N/V/D - CT scan without obstruction or inflammatory changes to bowels.  Did have single gallstone and stable pancreatic cyst.  Gi following. S/p EGD 10/27with gastritis, small hiatal hernia, mild schatzki ring. May need follow up with IR for possible mesenteric ischemia but given overall trajectory seems unlikely.  # ESRD -  HD on MWF schedule.  Seen by palliative care team.  Patient is DNR.  Her tolerance of HD is very poor at this time. Even if she does tolerate dialysis on one session she is too somnolent to be discharged and I do not see her having significant improvement over time. Continue discussions with palliative care.  # Hypertension/volume: On dysphagia diet but not eating.  Remains lethargic.  Adjust EDW as able. UF limited by hypotension  # Anemia: Hemoglobin 9.5 likely due to critical illness.  Monitor.  # CKD-Metabolic bone disease -continue binders when able to eat.  She is currently lethargic and somnolent.  # Combined systolic and diastolic CHF - as above UF as tolerated.  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 Karin Lieu.  It seems like completed antibiotics.  Abx per primary  # Unresponsive/lethargy: CT head with no acute finding however stable atrophy and consistent with chronic microvascular ischemia.  On antibiotics for sepsis.  No change in mental status.  Palliative care is following.   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 Doesn't change treatment at  this time.  I have discussed with the patient's daughter and palliative care team today.   Subjective: Poor tolerance of HD yesterday. Marylene Land and I discussed with family. I told daughter I do not expect the patient to improve.  Objective Vital signs in last 24 hours: Vitals:   05/22/23 1930 05/22/23 2017 05/16/2023 0419 04/28/2023 0500  BP: (!) 105/40 114/69 (!) 112/42   Pulse: 70 76    Resp: 18 18    Temp: 97.9 F (36.6 C) 98 F (36.7 C) 99.4 F (37.4 C)   TempSrc: Axillary Axillary Oral   SpO2: 100% 98% 95%   Weight: 36.1 kg   38.6 kg   Weight change: 0.3 kg  Intake/Output Summary (Last 24 hours) at 04/25/2023 0918 Last data filed at 05/05/2023 0900 Gross per 24 hour  Intake 150 ml  Output 0 ml  Net 150 ml       Labs: RENAL PANEL Recent Labs  Lab 05/19/23 0458 05/20/23 0419 05/21/23 0423 05/22/23 0430 05/08/2023 0457  NA 133* 135 134* 134* 135  K 4.4 5.2* 3.9 3.9 3.5  CL 95* 96* 98 96* 97*  CO2 21* 16* 24 25 27   GLUCOSE 63* 38* 78 52* 117*  BUN 44* 61* 27* 41* 21  CREATININE 5.83* 7.53* 4.14* 5.78* 3.30*  CALCIUM 8.1* 8.2* 8.2* 8.2* 8.4*  PHOS 7.8* 10.6* 4.7* 6.2* 3.3  ALBUMIN 2.2* 2.2* 2.3* 2.1* 2.5*    Liver Function Tests: Recent Labs  Lab 05/21/23 0423 05/22/23 0430 05/06/2023 0457  ALBUMIN 2.3*  2.1* 2.5*   No results for input(s): "LIPASE", "AMYLASE" in the last 168 hours. No results for input(s): "AMMONIA" in the last 168 hours.  CBC: Recent Labs    07/12/22 0600 08/16/22 1437 02/02/23 0924 02/04/23 1627 02/11/23 0321 02/12/23 0448 05/15/23 0224 05/16/23 0506 05/17/23 0417 05/20/23 0419 05/21/23 0423 05/22/23 0430  HGB  --    < > 8.1*   < > 6.8*   < > 10.8*  --  10.5* 11.5* 9.7* 9.5*  MCV  --    < > 91.3   < > 92.7   < > 84.0  --  87.4 89.1 87.1 87.0  VITAMINB12  --   --  812  --   --   --   --  969*  --   --   --   --   FOLATE  --   --  14.6  --   --   --   --   --   --   --   --   --   FERRITIN 373*  --   --   --  286  --   --    --   --   --   --   --   TIBC 148*  --   --   --  118*  --   --   --   --   --   --   --   IRON 22*  --   --   --  21*  --   --   --   --   --   --   --    < > = values in this interval not displayed.    Cardiac Enzymes: No results for input(s): "CKTOTAL", "CKMB", "CKMBINDEX", "TROPONINI" in the last 168 hours. CBG: Recent Labs  Lab 05/22/23 1129 05/22/23 1323 05/22/23 2335 06/06/2023 0021 06/06/23 0412  GLUCAP 182* 82 61* 125* 70    Iron Studies: No results for input(s): "IRON", "TIBC", "TRANSFERRIN", "FERRITIN" in the last 72 hours. Studies/Results: MR BRAIN WO CONTRAST  Result Date: 2023/06/06 CLINICAL DATA:  Initial evaluation for mental status change. EXAM: MRI HEAD WITHOUT CONTRAST TECHNIQUE: Multiplanar, multiecho pulse sequences of the brain and surrounding structures were obtained without intravenous contrast. COMPARISON:  Prior CT from 05/20/2023 FINDINGS: Brain: Cerebral volume within normal limits for age. Patchy T2/FLAIR hyperintensity involving the periventricular and deep white matter both cerebral hemispheres, consistent with chronic small vessel ischemic disease, mild for age. Few small remote lacunar infarcts present about the left basal ganglia and left cerebellum. Few scattered subcentimeter foci of restricted diffusion are seen involving the bilateral cerebral hemispheres (series 5, images 23, 26, 27, 31, 37), consistent with small acute ischemic infarcts. No associated hemorrhage or mass effect. Gray-white matter differentiation otherwise maintained. No acute intracranial hemorrhage. Few scattered chronic micro hemorrhages noted, likely hypertensive in nature. No mass lesion, midline shift or mass effect. No hydrocephalus or extra-axial fluid collection. Pituitary gland and suprasellar region within normal limits. Vascular: Major intracranial vascular flow voids are maintained. Skull and upper cervical spine: Craniocervical junction within normal limits. Bone marrow signal  intensity normal. No scalp soft tissue abnormality. Sinuses/Orbits: Prior bilateral ocular lens replacement. Chronic left sphenoid sinusitis noted. Paranasal sinuses are otherwise largely clear. Moderate bilateral mastoid effusions, increased from prior. Other: None. IMPRESSION: 1. 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 A central thromboembolic etiology is likely given the various vascular distributions  involved. 2. Underlying mild chronic microvascular ischemic disease. 3. Moderate bilateral mastoid effusions, increased from prior. 4. Chronic left sphenoid sinusitis. Electronically Signed   By: Rise Mu M.D.   On: 05/04/2023 04:34   ECHOCARDIOGRAM LIMITED  Result Date: 05/22/2023    ECHOCARDIOGRAM LIMITED REPORT   Patient Name:   TYME BRUMIT Date of Exam: 05/22/2023 Medical Rec #:  [id number]        Height:       59.0 in Accession #:    [id number]       Weight:       78.9 lb Date of Birth:  1941/01/07        BSA:          1.242 m Patient Age:    82 years         BP:           125/87 mmHg Patient Gender: F                HR:           84 bpm. [facility] Procedure: Limited Echo Indications:    Endocarditis I38  History:        Patient has prior history of Echocardiogram examinations, most                 recent 02/11/2023. Risk Factors:Hypertension and Dyslipidemia. Hx                 of ESRD, Chronic combined systolic and diastolic CHF (congestive                 heart failure), Elevated troponin, BNP and Pleural effusion.  Sonographer:    Celesta Gentile RCS Referring Phys: [id number] Tyrone Nine IMPRESSIONS  1. Limited study.  2. Left ventricular ejection fraction, by estimation, is 50 to 55%. The left ventricle has low normal function. The left ventricle demonstrates global hypokinesis. There is mild concentric left ventricular hypertrophy.  3. Right ventricular systolic function is normal. The right  ventricular size is normal. Tricuspid regurgitation signal is inadequate for assessing PA pressure.  4. Left atrial size was mildly dilated.  5. Catheter tip visualized in right atrium.  6. The mitral valve is degenerative. Mild mitral valve regurgitation.  7. The aortic valve is tricuspid. There is mild calcification of the aortic valve. Aortic valve regurgitation is trivial.  8. The inferior vena cava is normal in size with greater than 50% respiratory variability, suggesting right atrial pressure of 3 mmHg.  9. No obvious valvular vegetations. Comparison(s): Prior images reviewed side by side. LVEF has improved, now 50-55% range. FINDINGS  Left Ventricle: Left ventricular ejection fraction, by estimation, is 50 to 55%. The left ventricle has low normal function. The left ventricle demonstrates global hypokinesis. The left ventricular internal cavity size was normal in size. There is mild concentric left ventricular hypertrophy. Right Ventricle: The right ventricular size is normal. No increase in right ventricular wall thickness. Right ventricular systolic function is normal. Tricuspid regurgitation signal is inadequate for assessing PA pressure. Left Atrium: Left atrial size was mildly dilated. Right Atrium: Catheter tip visualized in right atrium. Right atrial size was normal in size. Pericardium: There is no evidence of pericardial effusion. Mitral Valve: The mitral valve is degenerative in appearance. There is mild thickening of the mitral valve leaflet(s). There is mild calcification of the mitral valve leaflet(s). Mild mitral annular calcification. Mild mitral valve regurgitation. Tricuspid Valve: The tricuspid valve is grossly  normal. Tricuspid valve regurgitation is trivial. Aortic Valve: The aortic valve is tricuspid. There is mild calcification of the aortic valve. There is mild aortic valve annular calcification. Aortic valve regurgitation is trivial. Pulmonic Valve: The pulmonic valve was not well  visualized. Pulmonic valve regurgitation is trivial. Aorta: The aortic root is normal in size and structure. Venous: The inferior vena cava is normal in size with greater than 50% respiratory variability, suggesting right atrial pressure of 3 mmHg. IAS/Shunts: No atrial level shunt detected by color flow Doppler. LEFT VENTRICLE PLAX 2D LVIDd:         4.90 cm LVIDs:         3.60 cm LV PW:         1.20 cm LV IVS:        1.10 cm LVOT diam:     1.90 cm LVOT Area:     2.84 cm  LEFT ATRIUM         Index LA diam:    3.70 cm 2.98 cm/m   AORTA Ao Root diam: 3.30 cm  SHUNTS Systemic Diam: 1.90 cm Nona Dell MD Electronically signed by Nona Dell MD Signature Date/Time: 05/22/2023/2:36:35 PM    Final    CT ABDOMEN PELVIS W CONTRAST  Result Date: 05/21/2023 CLINICAL DATA:  Persistent abdominal pain. EXAM: CT ABDOMEN AND PELVIS WITH CONTRAST TECHNIQUE: Multidetector CT imaging of the abdomen and pelvis was performed using the standard protocol following bolus administration of intravenous contrast. RADIATION DOSE REDUCTION: This exam was performed according to the departmental dose-optimization program which includes automated exposure control, adjustment of the mA and/or kV according to patient size and/or use of iterative reconstruction technique. CONTRAST:  OMNIPAQUE IOHEXOL 300 MG/ML  SOLN COMPARISON:  05/17/2023 FINDINGS: Lower Chest: No significant change in small bilateral pleural effusions and bilateral lower lobe atelectasis, left side greater than right. Hepatobiliary: No suspicious hepatic masses identified. Gallbladder contains some contrast but is otherwise unremarkable in appearance. No evidence of biliary obstruction. Pancreas: Stable 1.6 cm simple appearing cystic lesion in the pancreatic body, which may represent an indolent cystic neoplasm or pseudocyst. Spleen: Within normal limits in size and appearance. Adrenals/Urinary Tract: Stable bilateral renal parenchymal atrophy. No suspicious  masses identified. No evidence of ureteral calculi or hydronephrosis. Urinary bladder is empty. Stomach/Bowel: No evidence of obstruction, inflammatory process or abnormal fluid collections. Vascular/Lymphatic: No pathologically enlarged lymph nodes. No acute vascular findings. Reproductive: Endometrial thickness measures 11 mm, which is abnormal for a postmenopausal female. No other pelvic masses identified. No evidence of inflammatory process or free fluid. Other:  None. Musculoskeletal:  No suspicious bone lesions identified. IMPRESSION: No acute findings within the abdomen or pelvis. Endometrial thickness measures 11 mm, which is abnormal for a postmenopausal female. Recommend GYN consultation for consideration of endometrial sampling. Stable 1.6 cm simple appearing cystic lesion in pancreatic body, which may represent an indolent cystic neoplasm or pseudocyst. Recommend continued imaging follow-up in 2 years, preferably with MRI. This recommendation follows ACR consensus guidelines: Management of Incidental Pancreatic Cysts: A White Paper of the ACR Incidental Findings Committee. J Am Coll Radiol 2017;14:911-923. Stable small bilateral pleural effusions and bilateral lower lobe atelectasis, left side greater than right. Electronically Signed   By: Danae Orleans M.D.   On: 05/21/2023 15:07   CT SOFT TISSUE NECK W CONTRAST  Result Date: 05/21/2023 CLINICAL DATA:  Soft tissue infection suspected, neck, xray done. EXAM: CT NECK WITH CONTRAST TECHNIQUE: Multidetector CT imaging of the neck was performed using the  standard protocol following the bolus administration of intravenous contrast. RADIATION DOSE REDUCTION: This exam was performed according to the departmental dose-optimization program which includes automated exposure control, adjustment of the mA and/or kV according to patient size and/or use of iterative reconstruction technique. CONTRAST:  OMNIPAQUE IOHEXOL 300 MG/ML  SOLN COMPARISON:  None  Available. FINDINGS: Pharynx and larynx: Normal. No mass or swelling. Salivary glands: No inflammation, mass, or stone. Thyroid: Unremarkable. Lymph nodes: No suspicious cervical lymphadenopathy. Vascular: Atherosclerotic calcifications of the carotid bulbs. Limited intracranial: Unremarkable. Visualized orbits: Unremarkable. Mastoids and visualized paranasal sinuses: Chronic left sphenoid sinusitis. Partial opacification of the bilateral mastoid air cells. Skeleton: Multilevel cervical spondylosis, worst at C4-5, where there is at least mild spinal canal stenosis. No suspicious bone lesions. Upper chest: Patchy consolidation in the lung apices with associated small bilateral pleural effusions. Other: None. IMPRESSION: 1. No acute abnormality in the neck. 2. Patchy consolidation in the lung apices, suspicious for multifocal pneumonia, with associated small bilateral pleural effusions. 3. Chronic left sphenoid sinusitis. Electronically Signed   By: Orvan Falconer M.D.   On: 05/21/2023 13:20    Medications: Infusions:  anticoagulant sodium citrate     piperacillin-tazobactam (ZOSYN)  IV 2.25 g (05/22/23 2327)   vancomycin 500 mg (05/22/23 1818)    Scheduled Medications:  Chlorhexidine Gluconate Cloth  6 each Topical Q0600   Chlorhexidine Gluconate Cloth  6 each Topical Q0600   erythromycin   Both Eyes Q6H   Gerhardt's butt cream   Topical TID   pantoprazole (PROTONIX) IV  40 mg Intravenous Q24H   scopolamine  1 patch Transdermal Q72H   sevelamer carbonate  1,600 mg Oral TID WC   triamcinolone   Topical TID    have reviewed scheduled and prn medications.  Physical Exam: General: Ill looking female, somnolent and not responding Heart:normal rate, no rub Lungs: gurgling breathing, mild iwob Abdomen:soft, Non-tender, non-distended Extremities:No peripheral edema Skin: bullae on left hand, dark large papules on forehead Dialysis Access: TDC  Eliany Mccarter J Geovani Tootle 06-05-2023,9:18 AM  LOS: 10 days

## 2023-05-23 NOTE — Discharge Summary (Signed)
age. Patchy T2/FLAIR hyperintensity involving the periventricular and deep white matter both cerebral hemispheres, consistent with chronic small vessel ischemic disease, mild for age. Few small remote lacunar infarcts present about the left basal ganglia and left cerebellum. Few scattered subcentimeter foci of restricted diffusion are seen involving the bilateral cerebral hemispheres (series 5, images 23, 26, 27, 31, 37), consistent with small acute ischemic infarcts. No associated hemorrhage or mass  effect. Gray-white matter differentiation otherwise maintained. No acute intracranial hemorrhage. Few scattered chronic micro hemorrhages noted, likely hypertensive in nature. No mass lesion, midline shift or mass effect. No hydrocephalus or extra-axial fluid collection. Pituitary gland and suprasellar region within normal limits. Vascular: Major intracranial vascular flow voids are maintained. Skull and upper cervical spine: Craniocervical junction within normal limits. Bone marrow signal intensity normal. No scalp soft tissue abnormality. Sinuses/Orbits: Prior bilateral ocular lens replacement. Chronic left sphenoid sinusitis noted. Paranasal sinuses are otherwise largely clear. Moderate bilateral mastoid effusions, increased from prior. Other: None. IMPRESSION: 1. 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 A central thromboembolic etiology is likely given the various vascular distributions involved. 2. Underlying mild chronic microvascular ischemic disease. 3. Moderate bilateral mastoid effusions, increased from prior. 4. Chronic left sphenoid sinusitis. Electronically Signed   By: Rise Mu M.D.   On: 05/23/2023 04:34   ECHOCARDIOGRAM LIMITED  Result Date: 05/22/2023    ECHOCARDIOGRAM LIMITED REPORT   Patient Name:   Felicia Williams Date of Exam: 05/22/2023 Medical Rec #:  [id number]        Height:       59.0 in Accession #:    [id number]       Weight:       78.9 lb Date of Birth:  [birth year]        BSA:          1.242 m Patient Age:    82 years         BP:           125/87 mmHg Patient Gender: F                HR:           84 bpm. [facility] Procedure: Limited Echo Indications:    Endocarditis I38  History:        Patient has prior history of Echocardiogram examinations, most                 recent 02/11/2023. Risk Factors:Hypertension and Dyslipidemia. Hx                 of ESRD, Chronic combined  systolic and diastolic CHF (congestive                 heart failure), Elevated troponin, BNP and Pleural effusion.  Sonographer:    Celesta Gentile RCS Referring Phys: [phone] [place] IMPRESSIONS  1. Limited study.  2. Left ventricular ejection fraction, by estimation, is 50 to 55%. The left ventricle has low normal function. The left ventricle demonstrates global hypokinesis. There is mild concentric left ventricular hypertrophy.  3. Right ventricular systolic function is normal. The right ventricular size is normal. Tricuspid regurgitation signal is inadequate for assessing PA pressure.  4. Left atrial size was mildly dilated.  5. Catheter tip visualized in right atrium.  6. The mitral valve is degenerative. Mild mitral valve regurgitation.  7. The aortic valve is tricuspid. There is mild calcification of the aortic  age. Patchy T2/FLAIR hyperintensity involving the periventricular and deep white matter both cerebral hemispheres, consistent with chronic small vessel ischemic disease, mild for age. Few small remote lacunar infarcts present about the left basal ganglia and left cerebellum. Few scattered subcentimeter foci of restricted diffusion are seen involving the bilateral cerebral hemispheres (series 5, images 23, 26, 27, 31, 37), consistent with small acute ischemic infarcts. No associated hemorrhage or mass  effect. Gray-white matter differentiation otherwise maintained. No acute intracranial hemorrhage. Few scattered chronic micro hemorrhages noted, likely hypertensive in nature. No mass lesion, midline shift or mass effect. No hydrocephalus or extra-axial fluid collection. Pituitary gland and suprasellar region within normal limits. Vascular: Major intracranial vascular flow voids are maintained. Skull and upper cervical spine: Craniocervical junction within normal limits. Bone marrow signal intensity normal. No scalp soft tissue abnormality. Sinuses/Orbits: Prior bilateral ocular lens replacement. Chronic left sphenoid sinusitis noted. Paranasal sinuses are otherwise largely clear. Moderate bilateral mastoid effusions, increased from prior. Other: None. IMPRESSION: 1. 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 A central thromboembolic etiology is likely given the various vascular distributions involved. 2. Underlying mild chronic microvascular ischemic disease. 3. Moderate bilateral mastoid effusions, increased from prior. 4. Chronic left sphenoid sinusitis. Electronically Signed   By: Rise Mu M.D.   On: 05/23/2023 04:34   ECHOCARDIOGRAM LIMITED  Result Date: 05/22/2023    ECHOCARDIOGRAM LIMITED REPORT   Patient Name:   Felicia Williams Date of Exam: 05/22/2023 Medical Rec #:  [id number]        Height:       59.0 in Accession #:    [id number]       Weight:       78.9 lb Date of Birth:  [birth year]        BSA:          1.242 m Patient Age:    82 years         BP:           125/87 mmHg Patient Gender: F                HR:           84 bpm. [facility] Procedure: Limited Echo Indications:    Endocarditis I38  History:        Patient has prior history of Echocardiogram examinations, most                 recent 02/11/2023. Risk Factors:Hypertension and Dyslipidemia. Hx                 of ESRD, Chronic combined  systolic and diastolic CHF (congestive                 heart failure), Elevated troponin, BNP and Pleural effusion.  Sonographer:    Celesta Gentile RCS Referring Phys: [phone] [place] IMPRESSIONS  1. Limited study.  2. Left ventricular ejection fraction, by estimation, is 50 to 55%. The left ventricle has low normal function. The left ventricle demonstrates global hypokinesis. There is mild concentric left ventricular hypertrophy.  3. Right ventricular systolic function is normal. The right ventricular size is normal. Tricuspid regurgitation signal is inadequate for assessing PA pressure.  4. Left atrial size was mildly dilated.  5. Catheter tip visualized in right atrium.  6. The mitral valve is degenerative. Mild mitral valve regurgitation.  7. The aortic valve is tricuspid. There is mild calcification of the aortic  age. Patchy T2/FLAIR hyperintensity involving the periventricular and deep white matter both cerebral hemispheres, consistent with chronic small vessel ischemic disease, mild for age. Few small remote lacunar infarcts present about the left basal ganglia and left cerebellum. Few scattered subcentimeter foci of restricted diffusion are seen involving the bilateral cerebral hemispheres (series 5, images 23, 26, 27, 31, 37), consistent with small acute ischemic infarcts. No associated hemorrhage or mass  effect. Gray-white matter differentiation otherwise maintained. No acute intracranial hemorrhage. Few scattered chronic micro hemorrhages noted, likely hypertensive in nature. No mass lesion, midline shift or mass effect. No hydrocephalus or extra-axial fluid collection. Pituitary gland and suprasellar region within normal limits. Vascular: Major intracranial vascular flow voids are maintained. Skull and upper cervical spine: Craniocervical junction within normal limits. Bone marrow signal intensity normal. No scalp soft tissue abnormality. Sinuses/Orbits: Prior bilateral ocular lens replacement. Chronic left sphenoid sinusitis noted. Paranasal sinuses are otherwise largely clear. Moderate bilateral mastoid effusions, increased from prior. Other: None. IMPRESSION: 1. 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 A central thromboembolic etiology is likely given the various vascular distributions involved. 2. Underlying mild chronic microvascular ischemic disease. 3. Moderate bilateral mastoid effusions, increased from prior. 4. Chronic left sphenoid sinusitis. Electronically Signed   By: Rise Mu M.D.   On: 05/23/2023 04:34   ECHOCARDIOGRAM LIMITED  Result Date: 05/22/2023    ECHOCARDIOGRAM LIMITED REPORT   Patient Name:   Felicia Williams Date of Exam: 05/22/2023 Medical Rec #:  [id number]        Height:       59.0 in Accession #:    [id number]       Weight:       78.9 lb Date of Birth:  [birth year]        BSA:          1.242 m Patient Age:    82 years         BP:           125/87 mmHg Patient Gender: F                HR:           84 bpm. [facility] Procedure: Limited Echo Indications:    Endocarditis I38  History:        Patient has prior history of Echocardiogram examinations, most                 recent 02/11/2023. Risk Factors:Hypertension and Dyslipidemia. Hx                 of ESRD, Chronic combined  systolic and diastolic CHF (congestive                 heart failure), Elevated troponin, BNP and Pleural effusion.  Sonographer:    Celesta Gentile RCS Referring Phys: [phone] [place] IMPRESSIONS  1. Limited study.  2. Left ventricular ejection fraction, by estimation, is 50 to 55%. The left ventricle has low normal function. The left ventricle demonstrates global hypokinesis. There is mild concentric left ventricular hypertrophy.  3. Right ventricular systolic function is normal. The right ventricular size is normal. Tricuspid regurgitation signal is inadequate for assessing PA pressure.  4. Left atrial size was mildly dilated.  5. Catheter tip visualized in right atrium.  6. The mitral valve is degenerative. Mild mitral valve regurgitation.  7. The aortic valve is tricuspid. There is mild calcification of the aortic  Physician Discharge Summary   Patient: Felicia Williams MRN: [id number] DOB: [birth year]  Admit date:     05/13/2023  Discharge date: 05/23/23  Discharge Physician: Onalee Hua Rachit Grim   PCP: Rebekah Chesterfield, NP   Recommendations at discharge:  Discharge to [facility] Course: 82 y.o. 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    CT abdomen pelvis without contrast showed small bilateral  pleural effusions with lower lobe consolidation when compared with the prior exam. Protonix was given, Reglan was given.  Lokelma due to hyperkalemia was also given. Hospitalist was asked to admit patient for further evaluation and management.   She was admitted, doxycycline course has been completed, gastroparesis treated with reglan though GI symptoms continue. GI consult requested. Pleural effusion was drained 10/25 without significant change in respiratory status.    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    CT head showed no acute intracranial abnormalities, but did show left mastoid effusion. This correlates to tenderness on exam and patient confirms pain in her ears, developed bloody discharged from left ear on 10/28. Unasyn was started.    She developed a fever, bleeding from the nose, and worsening headache.  She remained persistently febrile and encephalopathic.  Abx were broadened to include vanc and zosyn.  The patient's fever did improve, but the patient's encephalopathy continue to worsen.  In addition, the patient was unable to tolerate dialysis secondary to hypotension.  The patient remained somnolent and was unsafe for any p.o. intake. MRI of the brain showed increased bilateral mastoid effusions with a few scattered  subcentimeter acute ischemic infarcts in the bilateral cerebral hemispheres  Goals of care discussions were held with the patient's daughter.  We discussed the patient's overall poor prognosis in the setting of her frailty, advanced age, multiple comorbidities, and acute medical illness.  We discussed the patient's poor quality of life and continued downward trajectory from a medical standpoint.  We also discussed the patient's previously expressed desire to stop dialysis given her overall poor quality of life. Subsequently, the patient's daughter felt that was in the patient's best interest to transition the focus of care to comfort measures to allow for natural death and preserve the patient's dignity.  TOC was consulted who ultimately consulted [facility] hospice.  They assisted in transitioning patient to residential hospice.  A bed was not yet available.  As result, the patient was transition to general inpatient hospice care.  Assessment and Plan: Nausea, vomiting, abdominal pain: No acute findings on CT. Amylase, lipase wnl.  - Some symptoms attributed to doxycycline, though, still remained after discontinuation.  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 IR follow up recommended for what could be culprit lesion (SMA) for chronic mesenteric ischemia.  - Repeat CT abd/pelvis today due to rigidity with exam is reassuring. - EGD 10/27 Dr. 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  - Continue PPI BID.  - Could restart metoclopramide 5mg  TID if symptoms persist per GI. - Limit narcotics (though these are chronic and cannot be stopped) -10/29 CT abd/pelvis--no acute findings -05/23/23 After goals of care discussion with the patient's daughter, it was felt  Physician Discharge Summary   Patient: Felicia Williams MRN: [id number] DOB: [birth year]  Admit date:     05/13/2023  Discharge date: 05/23/23  Discharge Physician: Onalee Hua Rachit Grim   PCP: Rebekah Chesterfield, NP   Recommendations at discharge:  Discharge to [facility] Course: 82 y.o. 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    CT abdomen pelvis without contrast showed small bilateral  pleural effusions with lower lobe consolidation when compared with the prior exam. Protonix was given, Reglan was given.  Lokelma due to hyperkalemia was also given. Hospitalist was asked to admit patient for further evaluation and management.   She was admitted, doxycycline course has been completed, gastroparesis treated with reglan though GI symptoms continue. GI consult requested. Pleural effusion was drained 10/25 without significant change in respiratory status.    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    CT head showed no acute intracranial abnormalities, but did show left mastoid effusion. This correlates to tenderness on exam and patient confirms pain in her ears, developed bloody discharged from left ear on 10/28. Unasyn was started.    She developed a fever, bleeding from the nose, and worsening headache.  She remained persistently febrile and encephalopathic.  Abx were broadened to include vanc and zosyn.  The patient's fever did improve, but the patient's encephalopathy continue to worsen.  In addition, the patient was unable to tolerate dialysis secondary to hypotension.  The patient remained somnolent and was unsafe for any p.o. intake. MRI of the brain showed increased bilateral mastoid effusions with a few scattered  subcentimeter acute ischemic infarcts in the bilateral cerebral hemispheres  Goals of care discussions were held with the patient's daughter.  We discussed the patient's overall poor prognosis in the setting of her frailty, advanced age, multiple comorbidities, and acute medical illness.  We discussed the patient's poor quality of life and continued downward trajectory from a medical standpoint.  We also discussed the patient's previously expressed desire to stop dialysis given her overall poor quality of life. Subsequently, the patient's daughter felt that was in the patient's best interest to transition the focus of care to comfort measures to allow for natural death and preserve the patient's dignity.  TOC was consulted who ultimately consulted [facility] hospice.  They assisted in transitioning patient to residential hospice.  A bed was not yet available.  As result, the patient was transition to general inpatient hospice care.  Assessment and Plan: Nausea, vomiting, abdominal pain: No acute findings on CT. Amylase, lipase wnl.  - Some symptoms attributed to doxycycline, though, still remained after discontinuation.  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 IR follow up recommended for what could be culprit lesion (SMA) for chronic mesenteric ischemia.  - Repeat CT abd/pelvis today due to rigidity with exam is reassuring. - EGD 10/27 Dr. 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  - Continue PPI BID.  - Could restart metoclopramide 5mg  TID if symptoms persist per GI. - Limit narcotics (though these are chronic and cannot be stopped) -10/29 CT abd/pelvis--no acute findings -05/23/23 After goals of care discussion with the patient's daughter, it was felt  age. Patchy T2/FLAIR hyperintensity involving the periventricular and deep white matter both cerebral hemispheres, consistent with chronic small vessel ischemic disease, mild for age. Few small remote lacunar infarcts present about the left basal ganglia and left cerebellum. Few scattered subcentimeter foci of restricted diffusion are seen involving the bilateral cerebral hemispheres (series 5, images 23, 26, 27, 31, 37), consistent with small acute ischemic infarcts. No associated hemorrhage or mass  effect. Gray-white matter differentiation otherwise maintained. No acute intracranial hemorrhage. Few scattered chronic micro hemorrhages noted, likely hypertensive in nature. No mass lesion, midline shift or mass effect. No hydrocephalus or extra-axial fluid collection. Pituitary gland and suprasellar region within normal limits. Vascular: Major intracranial vascular flow voids are maintained. Skull and upper cervical spine: Craniocervical junction within normal limits. Bone marrow signal intensity normal. No scalp soft tissue abnormality. Sinuses/Orbits: Prior bilateral ocular lens replacement. Chronic left sphenoid sinusitis noted. Paranasal sinuses are otherwise largely clear. Moderate bilateral mastoid effusions, increased from prior. Other: None. IMPRESSION: 1. 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 A central thromboembolic etiology is likely given the various vascular distributions involved. 2. Underlying mild chronic microvascular ischemic disease. 3. Moderate bilateral mastoid effusions, increased from prior. 4. Chronic left sphenoid sinusitis. Electronically Signed   By: Rise Mu M.D.   On: 05/23/2023 04:34   ECHOCARDIOGRAM LIMITED  Result Date: 05/22/2023    ECHOCARDIOGRAM LIMITED REPORT   Patient Name:   Felicia Williams Date of Exam: 05/22/2023 Medical Rec #:  [id number]        Height:       59.0 in Accession #:    [id number]       Weight:       78.9 lb Date of Birth:  [birth year]        BSA:          1.242 m Patient Age:    82 years         BP:           125/87 mmHg Patient Gender: F                HR:           84 bpm. [facility] Procedure: Limited Echo Indications:    Endocarditis I38  History:        Patient has prior history of Echocardiogram examinations, most                 recent 02/11/2023. Risk Factors:Hypertension and Dyslipidemia. Hx                 of ESRD, Chronic combined  systolic and diastolic CHF (congestive                 heart failure), Elevated troponin, BNP and Pleural effusion.  Sonographer:    Celesta Gentile RCS Referring Phys: [phone] [place] IMPRESSIONS  1. Limited study.  2. Left ventricular ejection fraction, by estimation, is 50 to 55%. The left ventricle has low normal function. The left ventricle demonstrates global hypokinesis. There is mild concentric left ventricular hypertrophy.  3. Right ventricular systolic function is normal. The right ventricular size is normal. Tricuspid regurgitation signal is inadequate for assessing PA pressure.  4. Left atrial size was mildly dilated.  5. Catheter tip visualized in right atrium.  6. The mitral valve is degenerative. Mild mitral valve regurgitation.  7. The aortic valve is tricuspid. There is mild calcification of the aortic  Physician Discharge Summary   Patient: Felicia Williams MRN: [id number] DOB: [birth year]  Admit date:     05/13/2023  Discharge date: 05/23/23  Discharge Physician: Onalee Hua Rachit Grim   PCP: Rebekah Chesterfield, NP   Recommendations at discharge:  Discharge to [facility] Course: 82 y.o. 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    CT abdomen pelvis without contrast showed small bilateral  pleural effusions with lower lobe consolidation when compared with the prior exam. Protonix was given, Reglan was given.  Lokelma due to hyperkalemia was also given. Hospitalist was asked to admit patient for further evaluation and management.   She was admitted, doxycycline course has been completed, gastroparesis treated with reglan though GI symptoms continue. GI consult requested. Pleural effusion was drained 10/25 without significant change in respiratory status.    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    CT head showed no acute intracranial abnormalities, but did show left mastoid effusion. This correlates to tenderness on exam and patient confirms pain in her ears, developed bloody discharged from left ear on 10/28. Unasyn was started.    She developed a fever, bleeding from the nose, and worsening headache.  She remained persistently febrile and encephalopathic.  Abx were broadened to include vanc and zosyn.  The patient's fever did improve, but the patient's encephalopathy continue to worsen.  In addition, the patient was unable to tolerate dialysis secondary to hypotension.  The patient remained somnolent and was unsafe for any p.o. intake. MRI of the brain showed increased bilateral mastoid effusions with a few scattered  subcentimeter acute ischemic infarcts in the bilateral cerebral hemispheres  Goals of care discussions were held with the patient's daughter.  We discussed the patient's overall poor prognosis in the setting of her frailty, advanced age, multiple comorbidities, and acute medical illness.  We discussed the patient's poor quality of life and continued downward trajectory from a medical standpoint.  We also discussed the patient's previously expressed desire to stop dialysis given her overall poor quality of life. Subsequently, the patient's daughter felt that was in the patient's best interest to transition the focus of care to comfort measures to allow for natural death and preserve the patient's dignity.  TOC was consulted who ultimately consulted [facility] hospice.  They assisted in transitioning patient to residential hospice.  A bed was not yet available.  As result, the patient was transition to general inpatient hospice care.  Assessment and Plan: Nausea, vomiting, abdominal pain: No acute findings on CT. Amylase, lipase wnl.  - Some symptoms attributed to doxycycline, though, still remained after discontinuation.  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 IR follow up recommended for what could be culprit lesion (SMA) for chronic mesenteric ischemia.  - Repeat CT abd/pelvis today due to rigidity with exam is reassuring. - EGD 10/27 Dr. 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  - Continue PPI BID.  - Could restart metoclopramide 5mg  TID if symptoms persist per GI. - Limit narcotics (though these are chronic and cannot be stopped) -10/29 CT abd/pelvis--no acute findings -05/23/23 After goals of care discussion with the patient's daughter, it was felt  Physician Discharge Summary   Patient: Felicia Williams MRN: [id number] DOB: [birth year]  Admit date:     05/13/2023  Discharge date: 05/23/23  Discharge Physician: Onalee Hua Rachit Grim   PCP: Rebekah Chesterfield, NP   Recommendations at discharge:  Discharge to [facility] Course: 82 y.o. 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    CT abdomen pelvis without contrast showed small bilateral  pleural effusions with lower lobe consolidation when compared with the prior exam. Protonix was given, Reglan was given.  Lokelma due to hyperkalemia was also given. Hospitalist was asked to admit patient for further evaluation and management.   She was admitted, doxycycline course has been completed, gastroparesis treated with reglan though GI symptoms continue. GI consult requested. Pleural effusion was drained 10/25 without significant change in respiratory status.    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    CT head showed no acute intracranial abnormalities, but did show left mastoid effusion. This correlates to tenderness on exam and patient confirms pain in her ears, developed bloody discharged from left ear on 10/28. Unasyn was started.    She developed a fever, bleeding from the nose, and worsening headache.  She remained persistently febrile and encephalopathic.  Abx were broadened to include vanc and zosyn.  The patient's fever did improve, but the patient's encephalopathy continue to worsen.  In addition, the patient was unable to tolerate dialysis secondary to hypotension.  The patient remained somnolent and was unsafe for any p.o. intake. MRI of the brain showed increased bilateral mastoid effusions with a few scattered  subcentimeter acute ischemic infarcts in the bilateral cerebral hemispheres  Goals of care discussions were held with the patient's daughter.  We discussed the patient's overall poor prognosis in the setting of her frailty, advanced age, multiple comorbidities, and acute medical illness.  We discussed the patient's poor quality of life and continued downward trajectory from a medical standpoint.  We also discussed the patient's previously expressed desire to stop dialysis given her overall poor quality of life. Subsequently, the patient's daughter felt that was in the patient's best interest to transition the focus of care to comfort measures to allow for natural death and preserve the patient's dignity.  TOC was consulted who ultimately consulted [facility] hospice.  They assisted in transitioning patient to residential hospice.  A bed was not yet available.  As result, the patient was transition to general inpatient hospice care.  Assessment and Plan: Nausea, vomiting, abdominal pain: No acute findings on CT. Amylase, lipase wnl.  - Some symptoms attributed to doxycycline, though, still remained after discontinuation.  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 IR follow up recommended for what could be culprit lesion (SMA) for chronic mesenteric ischemia.  - Repeat CT abd/pelvis today due to rigidity with exam is reassuring. - EGD 10/27 Dr. 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  - Continue PPI BID.  - Could restart metoclopramide 5mg  TID if symptoms persist per GI. - Limit narcotics (though these are chronic and cannot be stopped) -10/29 CT abd/pelvis--no acute findings -05/23/23 After goals of care discussion with the patient's daughter, it was felt  Physician Discharge Summary   Patient: Felicia Williams MRN: [id number] DOB: [birth year]  Admit date:     05/13/2023  Discharge date: 05/23/23  Discharge Physician: Onalee Hua Rachit Grim   PCP: Rebekah Chesterfield, NP   Recommendations at discharge:  Discharge to [facility] Course: 82 y.o. 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    CT abdomen pelvis without contrast showed small bilateral  pleural effusions with lower lobe consolidation when compared with the prior exam. Protonix was given, Reglan was given.  Lokelma due to hyperkalemia was also given. Hospitalist was asked to admit patient for further evaluation and management.   She was admitted, doxycycline course has been completed, gastroparesis treated with reglan though GI symptoms continue. GI consult requested. Pleural effusion was drained 10/25 without significant change in respiratory status.    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    CT head showed no acute intracranial abnormalities, but did show left mastoid effusion. This correlates to tenderness on exam and patient confirms pain in her ears, developed bloody discharged from left ear on 10/28. Unasyn was started.    She developed a fever, bleeding from the nose, and worsening headache.  She remained persistently febrile and encephalopathic.  Abx were broadened to include vanc and zosyn.  The patient's fever did improve, but the patient's encephalopathy continue to worsen.  In addition, the patient was unable to tolerate dialysis secondary to hypotension.  The patient remained somnolent and was unsafe for any p.o. intake. MRI of the brain showed increased bilateral mastoid effusions with a few scattered  subcentimeter acute ischemic infarcts in the bilateral cerebral hemispheres  Goals of care discussions were held with the patient's daughter.  We discussed the patient's overall poor prognosis in the setting of her frailty, advanced age, multiple comorbidities, and acute medical illness.  We discussed the patient's poor quality of life and continued downward trajectory from a medical standpoint.  We also discussed the patient's previously expressed desire to stop dialysis given her overall poor quality of life. Subsequently, the patient's daughter felt that was in the patient's best interest to transition the focus of care to comfort measures to allow for natural death and preserve the patient's dignity.  TOC was consulted who ultimately consulted [facility] hospice.  They assisted in transitioning patient to residential hospice.  A bed was not yet available.  As result, the patient was transition to general inpatient hospice care.  Assessment and Plan: Nausea, vomiting, abdominal pain: No acute findings on CT. Amylase, lipase wnl.  - Some symptoms attributed to doxycycline, though, still remained after discontinuation.  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 IR follow up recommended for what could be culprit lesion (SMA) for chronic mesenteric ischemia.  - Repeat CT abd/pelvis today due to rigidity with exam is reassuring. - EGD 10/27 Dr. 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  - Continue PPI BID.  - Could restart metoclopramide 5mg  TID if symptoms persist per GI. - Limit narcotics (though these are chronic and cannot be stopped) -10/29 CT abd/pelvis--no acute findings -05/23/23 After goals of care discussion with the patient's daughter, it was felt  age. Patchy T2/FLAIR hyperintensity involving the periventricular and deep white matter both cerebral hemispheres, consistent with chronic small vessel ischemic disease, mild for age. Few small remote lacunar infarcts present about the left basal ganglia and left cerebellum. Few scattered subcentimeter foci of restricted diffusion are seen involving the bilateral cerebral hemispheres (series 5, images 23, 26, 27, 31, 37), consistent with small acute ischemic infarcts. No associated hemorrhage or mass  effect. Gray-white matter differentiation otherwise maintained. No acute intracranial hemorrhage. Few scattered chronic micro hemorrhages noted, likely hypertensive in nature. No mass lesion, midline shift or mass effect. No hydrocephalus or extra-axial fluid collection. Pituitary gland and suprasellar region within normal limits. Vascular: Major intracranial vascular flow voids are maintained. Skull and upper cervical spine: Craniocervical junction within normal limits. Bone marrow signal intensity normal. No scalp soft tissue abnormality. Sinuses/Orbits: Prior bilateral ocular lens replacement. Chronic left sphenoid sinusitis noted. Paranasal sinuses are otherwise largely clear. Moderate bilateral mastoid effusions, increased from prior. Other: None. IMPRESSION: 1. 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 A central thromboembolic etiology is likely given the various vascular distributions involved. 2. Underlying mild chronic microvascular ischemic disease. 3. Moderate bilateral mastoid effusions, increased from prior. 4. Chronic left sphenoid sinusitis. Electronically Signed   By: Rise Mu M.D.   On: 05/23/2023 04:34   ECHOCARDIOGRAM LIMITED  Result Date: 05/22/2023    ECHOCARDIOGRAM LIMITED REPORT   Patient Name:   Felicia Williams Date of Exam: 05/22/2023 Medical Rec #:  [id number]        Height:       59.0 in Accession #:    [id number]       Weight:       78.9 lb Date of Birth:  [birth year]        BSA:          1.242 m Patient Age:    82 years         BP:           125/87 mmHg Patient Gender: F                HR:           84 bpm. [facility] Procedure: Limited Echo Indications:    Endocarditis I38  History:        Patient has prior history of Echocardiogram examinations, most                 recent 02/11/2023. Risk Factors:Hypertension and Dyslipidemia. Hx                 of ESRD, Chronic combined  systolic and diastolic CHF (congestive                 heart failure), Elevated troponin, BNP and Pleural effusion.  Sonographer:    Celesta Gentile RCS Referring Phys: [id number] [place] IMPRESSIONS  1. Limited study.  2. Left ventricular ejection fraction, by estimation, is 50 to 55%. The left ventricle has low normal function. The left ventricle demonstrates global hypokinesis. There is mild concentric left ventricular hypertrophy.  3. Right ventricular systolic function is normal. The right ventricular size is normal. Tricuspid regurgitation signal is inadequate for assessing PA pressure.  4. Left atrial size was mildly dilated.  5. Catheter tip visualized in right atrium.  6. The mitral valve is degenerative. Mild mitral valve regurgitation.  7. The aortic valve is tricuspid. There is mild calcification of the aortic  Physician Discharge Summary   Patient: Felicia Williams MRN: [id number] DOB: [birth year]  Admit date:     05/13/2023  Discharge date: 05/23/23  Discharge Physician: Onalee Hua Rachit Grim   PCP: Rebekah Chesterfield, NP   Recommendations at discharge:  Discharge to [facility] Course: 82 y.o. 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    CT abdomen pelvis without contrast showed small bilateral  pleural effusions with lower lobe consolidation when compared with the prior exam. Protonix was given, Reglan was given.  Lokelma due to hyperkalemia was also given. Hospitalist was asked to admit patient for further evaluation and management.   She was admitted, doxycycline course has been completed, gastroparesis treated with reglan though GI symptoms continue. GI consult requested. Pleural effusion was drained 10/25 without significant change in respiratory status.    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    CT head showed no acute intracranial abnormalities, but did show left mastoid effusion. This correlates to tenderness on exam and patient confirms pain in her ears, developed bloody discharged from left ear on 10/28. Unasyn was started.    She developed a fever, bleeding from the nose, and worsening headache.  She remained persistently febrile and encephalopathic.  Abx were broadened to include vanc and zosyn.  The patient's fever did improve, but the patient's encephalopathy continue to worsen.  In addition, the patient was unable to tolerate dialysis secondary to hypotension.  The patient remained somnolent and was unsafe for any p.o. intake. MRI of the brain showed increased bilateral mastoid effusions with a few scattered  subcentimeter acute ischemic infarcts in the bilateral cerebral hemispheres  Goals of care discussions were held with the patient's daughter.  We discussed the patient's overall poor prognosis in the setting of her frailty, advanced age, multiple comorbidities, and acute medical illness.  We discussed the patient's poor quality of life and continued downward trajectory from a medical standpoint.  We also discussed the patient's previously expressed desire to stop dialysis given her overall poor quality of life. Subsequently, the patient's daughter felt that was in the patient's best interest to transition the focus of care to comfort measures to allow for natural death and preserve the patient's dignity.  TOC was consulted who ultimately consulted [facility] hospice.  They assisted in transitioning patient to residential hospice.  A bed was not yet available.  As result, the patient was transition to general inpatient hospice care.  Assessment and Plan: Nausea, vomiting, abdominal pain: No acute findings on CT. Amylase, lipase wnl.  - Some symptoms attributed to doxycycline, though, still remained after discontinuation.  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 IR follow up recommended for what could be culprit lesion (SMA) for chronic mesenteric ischemia.  - Repeat CT abd/pelvis today due to rigidity with exam is reassuring. - EGD 10/27 Dr. 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  - Continue PPI BID.  - Could restart metoclopramide 5mg  TID if symptoms persist per GI. - Limit narcotics (though these are chronic and cannot be stopped) -10/29 CT abd/pelvis--no acute findings -05/23/23 After goals of care discussion with the patient's daughter, it was felt

## 2023-05-23 NOTE — Telephone Encounter (Signed)
Referral placed.

## 2023-05-23 NOTE — H&P (Signed)
History and Physical    Patient: Felicia Williams LKG:[id number] DOB: 09-14-1940 DOA: 06-07-2023 DOS: the patient was seen and examined on 2023-06-07 PCP: Rebekah Chesterfield, NP  Patient coming from:  Hospital  Chief Complaint: No chief complaint on file.  HPI: Felicia Williams is a 82 y.o. 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    CT abdomen pelvis without contrast showed small bilateral  pleural effusions with lower lobe consolidation when compared with the prior exam. Protonix was given, Reglan was given.  Lokelma due to hyperkalemia was also given. Hospitalist was asked to admit patient for further evaluation and management.   She was admitted, doxycycline course has been completed, gastroparesis treated with reglan though GI symptoms continue. GI consult requested. Pleural effusion was drained 10/25 without significant change in respiratory status.    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    CT head showed no acute intracranial abnormalities, but did show left mastoid effusion. This correlates to tenderness on exam and patient confirms pain in her ears, developed bloody discharged from left ear on 10/28. Unasyn was started.    She developed a fever, bleeding from the nose, and worsening headache.  She remained persistently febrile and encephalopathic.  Abx were broadened to include vanc and zosyn.  The patient's fever did improve, but the patient's encephalopathy continue to worsen.  In addition, the patient was unable to tolerate dialysis secondary to hypotension.  The patient remained somnolent and was unsafe for any p.o. intake. MRI of the brain showed increased bilateral mastoid effusions with a few scattered  subcentimeter acute ischemic infarcts in the bilateral cerebral hemispheres   Goals of care discussions were held with the patient's daughter.  We discussed the patient's overall poor prognosis in the setting of her frailty, advanced age, multiple comorbidities, and acute medical illness.  We discussed the patient's poor quality of life and continued downward trajectory from a medical standpoint.  We also discussed the patient's previously expressed desire to stop dialysis given her overall poor quality of life. Subsequently, the patient's daughter felt that was in the patient's best interest to transition the focus of care to comfort measures to allow for natural death and preserve the patient's dignity.   TOC was consulted who ultimately consulted [facility] hospice.  They assisted in transitioning patient to residential hospice.  A bed was not yet available.  As result, the patient was transition to general inpatient hospice care. Review of Systems: As mentioned in the history of present illness. All other systems reviewed and are negative. Past Medical History:  Diagnosis Date   Anemia in ESRD (end-stage renal disease) (HCC) 08/18/2018   CHF (congestive heart failure) (HCC)    Coccyx contusion--with chronic pain due to fall    Depression    Dyspnea    02 2l via [place]   ESRD on hemodialysis (HCC)    Essential hypertension, benign    Gastric polyps    Gastroparesis    followed by Dr. Karilyn Cota.   GERD (gastroesophageal reflux disease)    Glomerulonephritis    Gout    Mixed hyperlipidemia    Spondylosis    Past Surgical History:  Procedure Laterality Date   APPENDECTOMY     AV FISTULA PLACEMENT Left 03/20/2022   Procedure: INSERTION OF  LEFT UPPER ARM ARTERIOVENOUS (AV) GORE-TEX GRAFT;  Surgeon: Larina Earthly, MD;  Location: [facility];  Service: Vascular;  Laterality: Left;   BIOPSY  05/19/2023   Procedure: BIOPSY;  Surgeon: [name], DO;  Location: [facility];  Service:  Endoscopy;;   CATARACT EXTRACTION Bilateral    COLONOSCOPY N/A 12/28/2015   Procedure: COLONOSCOPY;  Surgeon: Malissa Hippo, MD;  Location: [facility];  Service: Endoscopy;  Laterality: N/A;  815   ESOPHAGOGASTRODUODENOSCOPY N/A 11/16/2020   Procedure: ESOPHAGOGASTRODUODENOSCOPY (EGD);  Surgeon: Malissa Hippo, MD;  Location: [facility];  Service: Endoscopy;  Laterality: N/A;  1:15   ESOPHAGOGASTRODUODENOSCOPY (EGD) WITH PROPOFOL N/A 05/19/2023   Procedure: ESOPHAGOGASTRODUODENOSCOPY (EGD) WITH PROPOFOL;  Surgeon: [name], DO;  Location: [facility];  Service: Endoscopy;  Laterality: N/A;   EXCHANGE OF A DIALYSIS CATHETER N/A 03/20/2022   Procedure: EXCHANGE OF A DIALYSIS CATHETER;  Surgeon: Larina Earthly, MD;  Location: [facility];  Service: Vascular;  Laterality: N/A;   INSERTION OF DIALYSIS CATHETER Right 09/30/2021   Procedure: INSERTION OF TUNNELED DIALYSIS CATHETER;  Surgeon: Maeola Harman, MD;  Location: [facility] OR;  Service: Vascular;  Laterality: Right;   IR FLUORO GUIDE CV LINE RIGHT  01/16/2019   IR REMOVAL TUN CV CATH W/O FL  07/01/2019   IR THORACENTESIS ASP PLEURAL SPACE W/IMG GUIDE  05/01/2023   IR THROMBECTOMY AV FISTULA W/THROMBOLYSIS/PTA INC/SHUNT/IMG LEFT Left 02/01/2023   IR US GUIDE VASC ACCESS LEFT  02/01/2023   IR US GUIDE VASC ACCESS RIGHT  01/16/2019   POLYPECTOMY  11/16/2020   Procedure: POLYPECTOMY;  Surgeon: Malissa Hippo, MD;  [facility]: [facility];  Service: Endoscopy;;  gastric   REMOVAL OF A DIALYSIS CATHETER  09/30/2021   Procedure: REMOVAL OF A PERITONEAL DIALYSIS CATHETER;  Surgeon: Maeola Harman, MD;  Location: [facility] OR;  Service: Vascular;;   REMOVAL OF A DIALYSIS CATHETER N/A 05/22/2022   Procedure: MINOR REMOVAL OF A TUNNELED DIALYSIS CATHETER;  Surgeon: Larina Earthly, MD;  Location: [facility];  Service: Vascular;  Laterality: N/A;   REVISON OF ARTERIOVENOUS FISTULA Left 04/30/2023   Procedure: LEFT ARM FISTULA  REVISION, INTERPOSITIONAL ARTEGRAFT;  Surgeon: Victorino Sparrow, MD;  Location: [facility] OR;  Service: Vascular;  Laterality: Left;   TUBAL LIGATION     Social History:  reports that she has never smoked. She has never been exposed to tobacco smoke. She has never used smokeless tobacco. She reports that she does not drink alcohol and does not use drugs.  Allergies  Allergen Reactions   Cefepime Other (See Comments)    Feb 2023 Encephalopathy with questionable seizures. Seems to be tolerating cefazolin , but the daughter mentioned not to start her on any cephalosporin     Morphine Other (See Comments)    "Dry heaving like crazy"   Sulfa Antibiotics Other (See Comments)    Shut pt's kidneys down    Family History  Problem Relation Age of Onset   CAD Father    Heart attack Father    Diabetes Mellitus II Father    Hypertension Father    Lupus Brother     Prior to Admission medications   Not on File    Physical Exam: HEENT:  [place]/AT, No thrush, no icterus CV:  RRR, no rub, no S3, no S4 Lung:  bilateral rales Abd:  soft/+BS, NT Ext:  No edema, no lymphangitis, no synovitis, no rash  Data Reviewed: Data reviewed  from earlier today on 10/31  Assessment and Plan: Nausea, vomiting, abdominal pain: No acute findings on CT. Amylase, lipase wnl.  - Some symptoms attributed to doxycycline, though, still remained after discontinuation.  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 IR follow up recommended for what could be culprit lesion (SMA) for chronic mesenteric ischemia.  - Repeat CT abd/pelvis 10/29--no acute findings - EGD 10/27 Dr. 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  - Continued PPI BID.  - Could restart metoclopramide 5mg  TID if symptoms persist per GI. - Limit narcotics initially -10/29 CT abd/pelvis--no acute  findings -05/05/2023 After goals of care discussion with the patient's daughter, it was felt in the patient's best interest to transition her focus of care to comfort measures to preserve the patient's DVT and to allow for natural death -As such no further workup will be undertaken   Left mastoiditis, mastoid effusion:  - initiated unasyn 10/28 - 10/29 CT abd/pelvis--no acute finding.  Sm bilat eff. Endometrial thickening - Pt has ENT referral already in place to [facility], had NP visits 9/5 and 9/12 for impacted cerumen of right ear canal that is not present at this time.  - Exam difficult due to patient agitation, though palpable firmness tracking down left neck prompted CT soft tissue which shows no parapharyngeal/deep space fluid collections.  -10/30--d/c Unasyn, continued vanc, started zosyn -05/01/2023 After goals of care discussion with the patient's daughter, it was felt in the patient's best interest to transition her focus of care to comfort measures to preserve the patient's DVT and to allow for natural death -As such no further workup will be undertaken   Fever, leukocytosis/lobar pneumonia - initially started on unasyn, add vancomycin.  - Note exam remains difficult due to pt cooperation, but if meningismus developed, would need to consider lumbar puncture.  -10/29 CT neck--no neck mass/swelling; apical patchy consolidations -10/29 blood cultures neg -10/30--d/c Unasyn, continued vanc -LP deferred.  Daughter did not want any further invasive procedures -05/19/2023 After goals of care discussion with the patient's daughter, it was felt in the patient's best interest to transition her focus of care to comfort measures to preserve the patient's DVT and to allow for natural death -As such no further workup will be undertaken   Acute ischemic stroke -10/30 MR brain--scattered subcentimeter ischemic infarcts bilateral cerebral hemispheres -05/03/2023 After goals of care discussion with the  patient's daughter, it was felt in the patient's best interest to transition her focus of care to comfort measures to preserve the patient's DVT and to allow for natural death -As such no further workup will be undertaken   Choking episode:  - Would keep her NPO while still drowsy (had ativan previously and now anesthesia care during EGD). SLP evaluation requested, pt is more alert, dysphagia diet ordered.   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 LVEF showed global hypokinesis with LVEF 35-40%, G1DD  - Continue monitoring clinically with serial dialysis hoping to take fluid off.  -Repeat thoracentesis this admission 10/25 consistent with transudate, negative gram stain--culture neg and cytology neg -This did not have significant impact on patient's subjective respiratory status nor oxygenation.  - Defer cardiology evaluation to outpatient setting as long as no anginal symptoms appear.  -  chronically on 2L   ESRD:  - Nephrology following, continue HD per routine.  - pt not able to tolerate fluid removal due to intradialytic hypotension   Hyperkalemia: Resolved. Tx with dialysis.   Acute encephalopathy: ?Delirium vs.uremia. B12 replete, ammonia normal. Onset after ativan argues for metabolic insult, again received anesthesia care and is drowsy afterward.  - Continue monitoring closely. CT showed no acute intracranial abnormalities 10/28. - Prefer lowest possible dose of medications, e.g. ativan 1mg  caused long lasting sedation. - Palliative care is following.  -pt remained encephalopathic and gradually worsened -05/13/2023 After goals of care discussion with the patient's daughter, it was felt in the patient's best interest to transition her focus of care to comfort measures to preserve the patient's DVT and to allow for  natural death -As such no further workup will be undertaken   HTN:  - off coreg, hydralazine.  - BP remains controlled   Left sacral stage 2 pressure injury: POA.  - Offload, optimize nutrition.    History of infected AVG: s/p excision, placement of artegraft by Dr. Karin Lieu.  - Completed doxycycline 10/23. Staples in place though appears to be healing well. If still admitted 10/31, would let [facility] know as she has follow up that day, may remove staples at that time.    DNR: Status confirmed with palliative consultation with patient on 10/25. Gold form in chart.    Weakness: This continues to be significant.  - Will continue getting up as much as tolerated, PT/OT efforts appreciated. Family concerned at her current level of functioning, may need rehabilitation prior to returning home.   Goals of care -Advance care planning, including the explanation and discussion of advance directives was carried out with the patient and family.  Code status including explanations of "Full Code" and "DNR" and alternatives were discussed in detail.  Discussion of end-of-life issues including but not limited palliative care, hospice care and the concept of hospice, other end-of-life care options, power of attorney for health care decisions, living wills, and physician orders for life-sustaining treatment were also discussed with the patient and family.  Total face to face time 26 minutes. -05/22/2023 After goals of care discussion with the patient's daughter, it was felt in the patient's best interest to transition her focus of care to comfort measures to preserve the patient's DVT and to allow for natural death -As such no further workup will be undertaken     Advance Care Planning: FULL COMFORT  Consults: none  Family Communication: daughter 10/31  Severity of Illness: The appropriate patient status for this patient is INPATIENT. Inpatient status is judged to be reasonable and necessary in order to provide  the required intensity of service to ensure the patient's safety. The patient's presenting symptoms, physical exam findings, and initial radiographic and laboratory data in the context of their chronic comorbidities is felt to place them at high risk for further clinical deterioration. Furthermore, it is not anticipated that the patient will be medically stable for discharge from the hospital within 2 midnights of admission.   * I certify that at the point of admission it is my clinical judgment that the patient will require inpatient hospital care spanning beyond 2 midnights from the point of admission due to high intensity of service, high risk for further deterioration and high frequency of surveillance required.*  Author: Catarina Hartshorn, MD 05/22/2023 5:56 PM  For on call review [email or web address].

## 2023-05-23 NOTE — Progress Notes (Signed)
BLOOD RIGHT FOREARM Performed at [facility], [address] [place], [place] [address]    Special Requests   Final    BOTTLES DRAWN AEROBIC ONLY Blood Culture adequate volume Performed at [facility], [address] [place], [place] [address]    Culture   Final    NO GROWTH 2 DAYS Performed at [facility], [address] [place], [place] [address]    Report Status PENDING  Incomplete     Scheduled Meds:  erythromycin   Both Eyes Q6H   Gerhardt's butt cream   Topical TID   glycopyrrolate  0.6 mg Intravenous Q4H   scopolamine  1 patch Transdermal Q72H   Continuous Infusions:  anticoagulant sodium citrate      Procedures/Studies: MR BRAIN WO CONTRAST  Result Date: 05/23/2023 CLINICAL DATA:  Initial evaluation for mental status change. EXAM: MRI HEAD WITHOUT CONTRAST TECHNIQUE: Multiplanar, multiecho pulse sequences of the brain and surrounding structures were obtained without intravenous contrast. COMPARISON:  Prior CT from 05/20/2023 FINDINGS: Brain: Cerebral volume within normal limits for age. Patchy T2/FLAIR hyperintensity involving the periventricular and deep white matter both cerebral hemispheres, consistent with chronic small vessel ischemic disease, mild for age. Few small remote lacunar infarcts present about the left basal ganglia and left cerebellum. Few scattered subcentimeter foci of restricted diffusion are seen involving the bilateral cerebral hemispheres (series 5, images 23, 26, 27, 31, 37), consistent with small acute ischemic infarcts. No associated hemorrhage or mass effect.  Gray-white matter differentiation otherwise maintained. No acute intracranial hemorrhage. Few scattered chronic micro hemorrhages noted, likely hypertensive in nature. No mass lesion, midline shift or mass effect. No hydrocephalus or extra-axial fluid collection. Pituitary gland and suprasellar region within normal limits. Vascular: Major intracranial vascular flow voids are maintained. Skull and upper cervical spine: Craniocervical junction within normal limits. Bone marrow signal intensity normal. No scalp soft tissue abnormality. Sinuses/Orbits: Prior bilateral ocular lens replacement. Chronic left sphenoid sinusitis noted. Paranasal sinuses are otherwise largely clear. Moderate bilateral mastoid effusions, increased from prior. Other: None. IMPRESSION: 1. 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 A central thromboembolic etiology is likely given the various vascular distributions involved. 2. Underlying mild chronic microvascular ischemic disease. 3. Moderate bilateral mastoid effusions, increased from prior. 4. Chronic left sphenoid sinusitis. Electronically Signed   By: Rise Mu M.D.   On: 05/23/2023 04:34   ECHOCARDIOGRAM LIMITED  Result Date: 05/22/2023    ECHOCARDIOGRAM LIMITED REPORT   Patient Name:   Felicia Williams Date of Exam: 05/22/2023 Medical Rec #:  [id number]        Height:       59.0 in Accession #:    [id number]       Weight:       78.9 lb Date of Birth:  05-02-1941        BSA:          1.242 m Patient Age:    82 years         BP:           125/87 mmHg Patient Gender: F                HR:           84 bpm. [facility] Procedure: Limited Echo Indications:    Endocarditis I38  History:        Patient has prior history of Echocardiogram examinations, most  BLOOD RIGHT FOREARM Performed at [facility], [address] [place], [place] [address]    Special Requests   Final    BOTTLES DRAWN AEROBIC ONLY Blood Culture adequate volume Performed at [facility], [address] [place], [place] [address]    Culture   Final    NO GROWTH 2 DAYS Performed at [facility], [address] [place], [place] [address]    Report Status PENDING  Incomplete     Scheduled Meds:  erythromycin   Both Eyes Q6H   Gerhardt's butt cream   Topical TID   glycopyrrolate  0.6 mg Intravenous Q4H   scopolamine  1 patch Transdermal Q72H   Continuous Infusions:  anticoagulant sodium citrate      Procedures/Studies: MR BRAIN WO CONTRAST  Result Date: 05/23/2023 CLINICAL DATA:  Initial evaluation for mental status change. EXAM: MRI HEAD WITHOUT CONTRAST TECHNIQUE: Multiplanar, multiecho pulse sequences of the brain and surrounding structures were obtained without intravenous contrast. COMPARISON:  Prior CT from 05/20/2023 FINDINGS: Brain: Cerebral volume within normal limits for age. Patchy T2/FLAIR hyperintensity involving the periventricular and deep white matter both cerebral hemispheres, consistent with chronic small vessel ischemic disease, mild for age. Few small remote lacunar infarcts present about the left basal ganglia and left cerebellum. Few scattered subcentimeter foci of restricted diffusion are seen involving the bilateral cerebral hemispheres (series 5, images 23, 26, 27, 31, 37), consistent with small acute ischemic infarcts. No associated hemorrhage or mass effect.  Gray-white matter differentiation otherwise maintained. No acute intracranial hemorrhage. Few scattered chronic micro hemorrhages noted, likely hypertensive in nature. No mass lesion, midline shift or mass effect. No hydrocephalus or extra-axial fluid collection. Pituitary gland and suprasellar region within normal limits. Vascular: Major intracranial vascular flow voids are maintained. Skull and upper cervical spine: Craniocervical junction within normal limits. Bone marrow signal intensity normal. No scalp soft tissue abnormality. Sinuses/Orbits: Prior bilateral ocular lens replacement. Chronic left sphenoid sinusitis noted. Paranasal sinuses are otherwise largely clear. Moderate bilateral mastoid effusions, increased from prior. Other: None. IMPRESSION: 1. 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 A central thromboembolic etiology is likely given the various vascular distributions involved. 2. Underlying mild chronic microvascular ischemic disease. 3. Moderate bilateral mastoid effusions, increased from prior. 4. Chronic left sphenoid sinusitis. Electronically Signed   By: Rise Mu M.D.   On: 05/23/2023 04:34   ECHOCARDIOGRAM LIMITED  Result Date: 05/22/2023    ECHOCARDIOGRAM LIMITED REPORT   Patient Name:   Felicia Williams Date of Exam: 05/22/2023 Medical Rec #:  [id number]        Height:       59.0 in Accession #:    [id number]       Weight:       78.9 lb Date of Birth:  05-02-1941        BSA:          1.242 m Patient Age:    82 years         BP:           125/87 mmHg Patient Gender: F                HR:           84 bpm. [facility] Procedure: Limited Echo Indications:    Endocarditis I38  History:        Patient has prior history of Echocardiogram examinations, most  PROGRESS NOTE  Oaklynn Collison ZOX:[id number] DOB: May 30, 1941 DOA: 05/13/2023 PCP: Rebekah [facility], NP  Brief History:  82 y.o. 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    CT abdomen pelvis without contrast showed small bilateral  pleural effusions with lower lobe consolidation when compared with the prior exam. Protonix was given, Reglan was given.  Lokelma due to hyperkalemia was also given. Hospitalist was asked to admit patient for further evaluation and management.   She was admitted, doxycycline course has been completed, gastroparesis treated with reglan though GI symptoms continue. GI consult requested. Pleural effusion was drained 10/25 without significant change in respiratory status.    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    CT head showed no acute intracranial abnormalities, but did show left mastoid effusion. This correlates to tenderness on exam and patient confirms pain in her ears, developed bloody discharged from left ear on 10/28. Unasyn was started.    She developed a fever, bleeding from the nose, and worsening headache.  She remained persistently febrile and encephalopathic.  Abx were broadened to include vanc and zosyn.  The patient's fever did improve, but the patient's encephalopathy continue to worsen.  In addition, the patient was unable to tolerate dialysis secondary to hypotension.  The patient remained somnolent and was unsafe for any p.o. intake. MRI of the brain showed increased bilateral mastoid effusions with a few scattered subcentimeter acute ischemic infarcts in the bilateral cerebral hemispheres  Goals of care discussions were held with the patient's daughter.  We discussed the  patient's overall poor prognosis in the setting of her frailty, advanced age, multiple comorbidities, and acute medical illness.  We discussed the patient's poor quality of life and continued downward trajectory from a medical standpoint.  We also discussed the patient's previously expressed desire to stop dialysis given her overall poor quality of life. Subsequently, the patient's daughter felt that was in the patient's best interest to transition the focus of care to comfort measures to allow for natural death and preserve the patient's dignity.   Assessment/Plan: Nausea, vomiting, abdominal pain: No acute findings on CT. Amylase, lipase wnl.  - Some symptoms attributed to doxycycline, though, still remained after discontinuation.  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 IR follow up recommended for what could be culprit lesion (SMA) for chronic mesenteric ischemia.  - Repeat CT abd/pelvis today due to rigidity with exam is reassuring. - EGD 10/27 Dr. 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  - Continue PPI BID.  - Could restart metoclopramide 5mg  TID if symptoms persist per GI. - Limit narcotics (though these are chronic and cannot be stopped) -10/29 CT abd/pelvis--no acute findings -05/23/23 After goals of care discussion with the patient's daughter, it was felt in the patient's best interest to transition her focus of care to comfort measures to preserve the patient's DVT and to allow for natural death -As such no further workup will be undertaken   Left mastoiditis, mastoid effusion:  - initiated unasyn 10/28 - 10/29 CT abd/pelvis--no acute finding.  Sm bilat eff. Endometrial thickening - Pt has ENT referral  PROGRESS NOTE  Oaklynn Collison ZOX:[id number] DOB: May 30, 1941 DOA: 05/13/2023 PCP: Rebekah Chesterfield, NP  Brief History:  82 y.o. 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    CT abdomen pelvis without contrast showed small bilateral  pleural effusions with lower lobe consolidation when compared with the prior exam. Protonix was given, Reglan was given.  Lokelma due to hyperkalemia was also given. Hospitalist was asked to admit patient for further evaluation and management.   She was admitted, doxycycline course has been completed, gastroparesis treated with reglan though GI symptoms continue. GI consult requested. Pleural effusion was drained 10/25 without significant change in respiratory status.    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    CT head showed no acute intracranial abnormalities, but did show left mastoid effusion. This correlates to tenderness on exam and patient confirms pain in her ears, developed bloody discharged from left ear on 10/28. Unasyn was started.    She developed a fever, bleeding from the nose, and worsening headache.  She remained persistently febrile and encephalopathic.  Abx were broadened to include vanc and zosyn.  The patient's fever did improve, but the patient's encephalopathy continue to worsen.  In addition, the patient was unable to tolerate dialysis secondary to hypotension.  The patient remained somnolent and was unsafe for any p.o. intake. MRI of the brain showed increased bilateral mastoid effusions with a few scattered subcentimeter acute ischemic infarcts in the bilateral cerebral hemispheres  Goals of care discussions were held with the patient's daughter.  We discussed the  patient's overall poor prognosis in the setting of her frailty, advanced age, multiple comorbidities, and acute medical illness.  We discussed the patient's poor quality of life and continued downward trajectory from a medical standpoint.  We also discussed the patient's previously expressed desire to stop dialysis given her overall poor quality of life. Subsequently, the patient's daughter felt that was in the patient's best interest to transition the focus of care to comfort measures to allow for natural death and preserve the patient's dignity.   Assessment/Plan: Nausea, vomiting, abdominal pain: No acute findings on CT. Amylase, lipase wnl.  - Some symptoms attributed to doxycycline, though, still remained after discontinuation.  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 IR follow up recommended for what could be culprit lesion (SMA) for chronic mesenteric ischemia.  - Repeat CT abd/pelvis today due to rigidity with exam is reassuring. - EGD 10/27 Dr. 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  - Continue PPI BID.  - Could restart metoclopramide 5mg  TID if symptoms persist per GI. - Limit narcotics (though these are chronic and cannot be stopped) -10/29 CT abd/pelvis--no acute findings -05/23/23 After goals of care discussion with the patient's daughter, it was felt in the patient's best interest to transition her focus of care to comfort measures to preserve the patient's DVT and to allow for natural death -As such no further workup will be undertaken   Left mastoiditis, mastoid effusion:  - initiated unasyn 10/28 - 10/29 CT abd/pelvis--no acute finding.  Sm bilat eff. Endometrial thickening - Pt has ENT referral  BLOOD RIGHT FOREARM Performed at [facility], [address] [place], [place] [address]    Special Requests   Final    BOTTLES DRAWN AEROBIC ONLY Blood Culture adequate volume Performed at [facility], [address] [place], [place] [address]    Culture   Final    NO GROWTH 2 DAYS Performed at [facility], [address] [place], [place] [address]    Report Status PENDING  Incomplete     Scheduled Meds:  erythromycin   Both Eyes Q6H   Gerhardt's butt cream   Topical TID   glycopyrrolate  0.6 mg Intravenous Q4H   scopolamine  1 patch Transdermal Q72H   Continuous Infusions:  anticoagulant sodium citrate      Procedures/Studies: MR BRAIN WO CONTRAST  Result Date: 05/23/2023 CLINICAL DATA:  Initial evaluation for mental status change. EXAM: MRI HEAD WITHOUT CONTRAST TECHNIQUE: Multiplanar, multiecho pulse sequences of the brain and surrounding structures were obtained without intravenous contrast. COMPARISON:  Prior CT from 05/20/2023 FINDINGS: Brain: Cerebral volume within normal limits for age. Patchy T2/FLAIR hyperintensity involving the periventricular and deep white matter both cerebral hemispheres, consistent with chronic small vessel ischemic disease, mild for age. Few small remote lacunar infarcts present about the left basal ganglia and left cerebellum. Few scattered subcentimeter foci of restricted diffusion are seen involving the bilateral cerebral hemispheres (series 5, images 23, 26, 27, 31, 37), consistent with small acute ischemic infarcts. No associated hemorrhage or mass effect.  Gray-white matter differentiation otherwise maintained. No acute intracranial hemorrhage. Few scattered chronic micro hemorrhages noted, likely hypertensive in nature. No mass lesion, midline shift or mass effect. No hydrocephalus or extra-axial fluid collection. Pituitary gland and suprasellar region within normal limits. Vascular: Major intracranial vascular flow voids are maintained. Skull and upper cervical spine: Craniocervical junction within normal limits. Bone marrow signal intensity normal. No scalp soft tissue abnormality. Sinuses/Orbits: Prior bilateral ocular lens replacement. Chronic left sphenoid sinusitis noted. Paranasal sinuses are otherwise largely clear. Moderate bilateral mastoid effusions, increased from prior. Other: None. IMPRESSION: 1. 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 A central thromboembolic etiology is likely given the various vascular distributions involved. 2. Underlying mild chronic microvascular ischemic disease. 3. Moderate bilateral mastoid effusions, increased from prior. 4. Chronic left sphenoid sinusitis. Electronically Signed   By: Rise Mu M.D.   On: 05/23/2023 04:34   ECHOCARDIOGRAM LIMITED  Result Date: 05/22/2023    ECHOCARDIOGRAM LIMITED REPORT   Patient Name:   Felicia Williams Date of Exam: 05/22/2023 Medical Rec #:  [id number]        Height:       59.0 in Accession #:    [id number]       Weight:       78.9 lb Date of Birth:  05-02-1941        BSA:          1.242 m Patient Age:    82 years         BP:           125/87 mmHg Patient Gender: F                HR:           84 bpm. [facility] Procedure: Limited Echo Indications:    Endocarditis I38  History:        Patient has prior history of Echocardiogram examinations, most  PROGRESS NOTE  Oaklynn Collison ZOX:[id number] DOB: May 30, 1941 DOA: 05/13/2023 PCP: Rebekah Chesterfield, NP  Brief History:  82 y.o. 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    CT abdomen pelvis without contrast showed small bilateral  pleural effusions with lower lobe consolidation when compared with the prior exam. Protonix was given, Reglan was given.  Lokelma due to hyperkalemia was also given. Hospitalist was asked to admit patient for further evaluation and management.   She was admitted, doxycycline course has been completed, gastroparesis treated with reglan though GI symptoms continue. GI consult requested. Pleural effusion was drained 10/25 without significant change in respiratory status.    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    CT head showed no acute intracranial abnormalities, but did show left mastoid effusion. This correlates to tenderness on exam and patient confirms pain in her ears, developed bloody discharged from left ear on 10/28. Unasyn was started.    She developed a fever, bleeding from the nose, and worsening headache.  She remained persistently febrile and encephalopathic.  Abx were broadened to include vanc and zosyn.  The patient's fever did improve, but the patient's encephalopathy continue to worsen.  In addition, the patient was unable to tolerate dialysis secondary to hypotension.  The patient remained somnolent and was unsafe for any p.o. intake. MRI of the brain showed increased bilateral mastoid effusions with a few scattered subcentimeter acute ischemic infarcts in the bilateral cerebral hemispheres  Goals of care discussions were held with the patient's daughter.  We discussed the  patient's overall poor prognosis in the setting of her frailty, advanced age, multiple comorbidities, and acute medical illness.  We discussed the patient's poor quality of life and continued downward trajectory from a medical standpoint.  We also discussed the patient's previously expressed desire to stop dialysis given her overall poor quality of life. Subsequently, the patient's daughter felt that was in the patient's best interest to transition the focus of care to comfort measures to allow for natural death and preserve the patient's dignity.   Assessment/Plan: Nausea, vomiting, abdominal pain: No acute findings on CT. Amylase, lipase wnl.  - Some symptoms attributed to doxycycline, though, still remained after discontinuation.  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 IR follow up recommended for what could be culprit lesion (SMA) for chronic mesenteric ischemia.  - Repeat CT abd/pelvis today due to rigidity with exam is reassuring. - EGD 10/27 Dr. 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  - Continue PPI BID.  - Could restart metoclopramide 5mg  TID if symptoms persist per GI. - Limit narcotics (though these are chronic and cannot be stopped) -10/29 CT abd/pelvis--no acute findings -05/23/23 After goals of care discussion with the patient's daughter, it was felt in the patient's best interest to transition her focus of care to comfort measures to preserve the patient's DVT and to allow for natural death -As such no further workup will be undertaken   Left mastoiditis, mastoid effusion:  - initiated unasyn 10/28 - 10/29 CT abd/pelvis--no acute finding.  Sm bilat eff. Endometrial thickening - Pt has ENT referral  PROGRESS NOTE  Oaklynn Collison ZOX:[id number] DOB: May 30, 1941 DOA: 05/13/2023 PCP: Rebekah [facility], NP  Brief History:  82 y.o. 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    CT abdomen pelvis without contrast showed small bilateral  pleural effusions with lower lobe consolidation when compared with the prior exam. Protonix was given, Reglan was given.  Lokelma due to hyperkalemia was also given. Hospitalist was asked to admit patient for further evaluation and management.   She was admitted, doxycycline course has been completed, gastroparesis treated with reglan though GI symptoms continue. GI consult requested. Pleural effusion was drained 10/25 without significant change in respiratory status.    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    CT head showed no acute intracranial abnormalities, but did show left mastoid effusion. This correlates to tenderness on exam and patient confirms pain in her ears, developed bloody discharged from left ear on 10/28. Unasyn was started.    She developed a fever, bleeding from the nose, and worsening headache.  She remained persistently febrile and encephalopathic.  Abx were broadened to include vanc and zosyn.  The patient's fever did improve, but the patient's encephalopathy continue to worsen.  In addition, the patient was unable to tolerate dialysis secondary to hypotension.  The patient remained somnolent and was unsafe for any p.o. intake. MRI of the brain showed increased bilateral mastoid effusions with a few scattered subcentimeter acute ischemic infarcts in the bilateral cerebral hemispheres  Goals of care discussions were held with the patient's daughter.  We discussed the  patient's overall poor prognosis in the setting of her frailty, advanced age, multiple comorbidities, and acute medical illness.  We discussed the patient's poor quality of life and continued downward trajectory from a medical standpoint.  We also discussed the patient's previously expressed desire to stop dialysis given her overall poor quality of life. Subsequently, the patient's daughter felt that was in the patient's best interest to transition the focus of care to comfort measures to allow for natural death and preserve the patient's dignity.   Assessment/Plan: Nausea, vomiting, abdominal pain: No acute findings on CT. Amylase, lipase wnl.  - Some symptoms attributed to doxycycline, though, still remained after discontinuation.  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 IR follow up recommended for what could be culprit lesion (SMA) for chronic mesenteric ischemia.  - Repeat CT abd/pelvis today due to rigidity with exam is reassuring. - EGD 10/27 Dr. 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  - Continue PPI BID.  - Could restart metoclopramide 5mg  TID if symptoms persist per GI. - Limit narcotics (though these are chronic and cannot be stopped) -10/29 CT abd/pelvis--no acute findings -05/23/23 After goals of care discussion with the patient's daughter, it was felt in the patient's best interest to transition her focus of care to comfort measures to preserve the patient's DVT and to allow for natural death -As such no further workup will be undertaken   Left mastoiditis, mastoid effusion:  - initiated unasyn 10/28 - 10/29 CT abd/pelvis--no acute finding.  Sm bilat eff. Endometrial thickening - Pt has ENT referral  PROGRESS NOTE  Oaklynn Collison ZOX:[id number] DOB: May 30, 1941 DOA: 05/13/2023 PCP: Rebekah Chesterfield, NP  Brief History:  82 y.o. 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    CT abdomen pelvis without contrast showed small bilateral  pleural effusions with lower lobe consolidation when compared with the prior exam. Protonix was given, Reglan was given.  Lokelma due to hyperkalemia was also given. Hospitalist was asked to admit patient for further evaluation and management.   She was admitted, doxycycline course has been completed, gastroparesis treated with reglan though GI symptoms continue. GI consult requested. Pleural effusion was drained 10/25 without significant change in respiratory status.    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    CT head showed no acute intracranial abnormalities, but did show left mastoid effusion. This correlates to tenderness on exam and patient confirms pain in her ears, developed bloody discharged from left ear on 10/28. Unasyn was started.    She developed a fever, bleeding from the nose, and worsening headache.  She remained persistently febrile and encephalopathic.  Abx were broadened to include vanc and zosyn.  The patient's fever did improve, but the patient's encephalopathy continue to worsen.  In addition, the patient was unable to tolerate dialysis secondary to hypotension.  The patient remained somnolent and was unsafe for any p.o. intake. MRI of the brain showed increased bilateral mastoid effusions with a few scattered subcentimeter acute ischemic infarcts in the bilateral cerebral hemispheres  Goals of care discussions were held with the patient's daughter.  We discussed the  patient's overall poor prognosis in the setting of her frailty, advanced age, multiple comorbidities, and acute medical illness.  We discussed the patient's poor quality of life and continued downward trajectory from a medical standpoint.  We also discussed the patient's previously expressed desire to stop dialysis given her overall poor quality of life. Subsequently, the patient's daughter felt that was in the patient's best interest to transition the focus of care to comfort measures to allow for natural death and preserve the patient's dignity.   Assessment/Plan: Nausea, vomiting, abdominal pain: No acute findings on CT. Amylase, lipase wnl.  - Some symptoms attributed to doxycycline, though, still remained after discontinuation.  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 IR follow up recommended for what could be culprit lesion (SMA) for chronic mesenteric ischemia.  - Repeat CT abd/pelvis today due to rigidity with exam is reassuring. - EGD 10/27 Dr. 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  - Continue PPI BID.  - Could restart metoclopramide 5mg  TID if symptoms persist per GI. - Limit narcotics (though these are chronic and cannot be stopped) -10/29 CT abd/pelvis--no acute findings -05/23/23 After goals of care discussion with the patient's daughter, it was felt in the patient's best interest to transition her focus of care to comfort measures to preserve the patient's DVT and to allow for natural death -As such no further workup will be undertaken   Left mastoiditis, mastoid effusion:  - initiated unasyn 10/28 - 10/29 CT abd/pelvis--no acute finding.  Sm bilat eff. Endometrial thickening - Pt has ENT referral  BLOOD RIGHT FOREARM Performed at [facility], [address] [place], [place] [address]    Special Requests   Final    BOTTLES DRAWN AEROBIC ONLY Blood Culture adequate volume Performed at [facility], [address] [place], [place] [address]    Culture   Final    NO GROWTH 2 DAYS Performed at [facility], [address] [place], [place] [address]    Report Status PENDING  Incomplete     Scheduled Meds:  erythromycin   Both Eyes Q6H   Gerhardt's butt cream   Topical TID   glycopyrrolate  0.6 mg Intravenous Q4H   scopolamine  1 patch Transdermal Q72H   Continuous Infusions:  anticoagulant sodium citrate      Procedures/Studies: MR BRAIN WO CONTRAST  Result Date: 05/23/2023 CLINICAL DATA:  Initial evaluation for mental status change. EXAM: MRI HEAD WITHOUT CONTRAST TECHNIQUE: Multiplanar, multiecho pulse sequences of the brain and surrounding structures were obtained without intravenous contrast. COMPARISON:  Prior CT from 05/20/2023 FINDINGS: Brain: Cerebral volume within normal limits for age. Patchy T2/FLAIR hyperintensity involving the periventricular and deep white matter both cerebral hemispheres, consistent with chronic small vessel ischemic disease, mild for age. Few small remote lacunar infarcts present about the left basal ganglia and left cerebellum. Few scattered subcentimeter foci of restricted diffusion are seen involving the bilateral cerebral hemispheres (series 5, images 23, 26, 27, 31, 37), consistent with small acute ischemic infarcts. No associated hemorrhage or mass effect.  Gray-white matter differentiation otherwise maintained. No acute intracranial hemorrhage. Few scattered chronic micro hemorrhages noted, likely hypertensive in nature. No mass lesion, midline shift or mass effect. No hydrocephalus or extra-axial fluid collection. Pituitary gland and suprasellar region within normal limits. Vascular: Major intracranial vascular flow voids are maintained. Skull and upper cervical spine: Craniocervical junction within normal limits. Bone marrow signal intensity normal. No scalp soft tissue abnormality. Sinuses/Orbits: Prior bilateral ocular lens replacement. Chronic left sphenoid sinusitis noted. Paranasal sinuses are otherwise largely clear. Moderate bilateral mastoid effusions, increased from prior. Other: None. IMPRESSION: 1. 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 A central thromboembolic etiology is likely given the various vascular distributions involved. 2. Underlying mild chronic microvascular ischemic disease. 3. Moderate bilateral mastoid effusions, increased from prior. 4. Chronic left sphenoid sinusitis. Electronically Signed   By: Rise Mu M.D.   On: 05/23/2023 04:34   ECHOCARDIOGRAM LIMITED  Result Date: 05/22/2023    ECHOCARDIOGRAM LIMITED REPORT   Patient Name:   Felicia Williams Date of Exam: 05/22/2023 Medical Rec #:  [id number]        Height:       59.0 in Accession #:    [id number]       Weight:       78.9 lb Date of Birth:  05-02-1941        BSA:          1.242 m Patient Age:    82 years         BP:           125/87 mmHg Patient Gender: F                HR:           84 bpm. [facility]am [facility] Procedure: Limited Echo Indications:    Endocarditis I38  History:        Patient has prior history of Echocardiogram examinations, most  PROGRESS NOTE  Oaklynn Collison ZOX:[id number] DOB: May 30, 1941 DOA: 05/13/2023 PCP: Rebekah Chesterfield, NP  Brief History:  82 y.o. 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    CT abdomen pelvis without contrast showed small bilateral  pleural effusions with lower lobe consolidation when compared with the prior exam. Protonix was given, Reglan was given.  Lokelma due to hyperkalemia was also given. Hospitalist was asked to admit patient for further evaluation and management.   She was admitted, doxycycline course has been completed, gastroparesis treated with reglan though GI symptoms continue. GI consult requested. Pleural effusion was drained 10/25 without significant change in respiratory status.    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    CT head showed no acute intracranial abnormalities, but did show left mastoid effusion. This correlates to tenderness on exam and patient confirms pain in her ears, developed bloody discharged from left ear on 10/28. Unasyn was started.    She developed a fever, bleeding from the nose, and worsening headache.  She remained persistently febrile and encephalopathic.  Abx were broadened to include vanc and zosyn.  The patient's fever did improve, but the patient's encephalopathy continue to worsen.  In addition, the patient was unable to tolerate dialysis secondary to hypotension.  The patient remained somnolent and was unsafe for any p.o. intake. MRI of the brain showed increased bilateral mastoid effusions with a few scattered subcentimeter acute ischemic infarcts in the bilateral cerebral hemispheres  Goals of care discussions were held with the patient's daughter.  We discussed the  patient's overall poor prognosis in the setting of her frailty, advanced age, multiple comorbidities, and acute medical illness.  We discussed the patient's poor quality of life and continued downward trajectory from a medical standpoint.  We also discussed the patient's previously expressed desire to stop dialysis given her overall poor quality of life. Subsequently, the patient's daughter felt that was in the patient's best interest to transition the focus of care to comfort measures to allow for natural death and preserve the patient's dignity.   Assessment/Plan: Nausea, vomiting, abdominal pain: No acute findings on CT. Amylase, lipase wnl.  - Some symptoms attributed to doxycycline, though, still remained after discontinuation.  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 IR follow up recommended for what could be culprit lesion (SMA) for chronic mesenteric ischemia.  - Repeat CT abd/pelvis today due to rigidity with exam is reassuring. - EGD 10/27 Dr. 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  - Continue PPI BID.  - Could restart metoclopramide 5mg  TID if symptoms persist per GI. - Limit narcotics (though these are chronic and cannot be stopped) -10/29 CT abd/pelvis--no acute findings -05/23/23 After goals of care discussion with the patient's daughter, it was felt in the patient's best interest to transition her focus of care to comfort measures to preserve the patient's DVT and to allow for natural death -As such no further workup will be undertaken   Left mastoiditis, mastoid effusion:  - initiated unasyn 10/28 - 10/29 CT abd/pelvis--no acute finding.  Sm bilat eff. Endometrial thickening - Pt has ENT referral  PROGRESS NOTE  Oaklynn Collison ZOX:[id number] DOB: May 30, 1941 DOA: 05/13/2023 PCP: Rebekah Chesterfield, NP  Brief History:  82 y.o. 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    CT abdomen pelvis without contrast showed small bilateral  pleural effusions with lower lobe consolidation when compared with the prior exam. Protonix was given, Reglan was given.  Lokelma due to hyperkalemia was also given. Hospitalist was asked to admit patient for further evaluation and management.   She was admitted, doxycycline course has been completed, gastroparesis treated with reglan though GI symptoms continue. GI consult requested. Pleural effusion was drained 10/25 without significant change in respiratory status.    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    CT head showed no acute intracranial abnormalities, but did show left mastoid effusion. This correlates to tenderness on exam and patient confirms pain in her ears, developed bloody discharged from left ear on 10/28. Unasyn was started.    She developed a fever, bleeding from the nose, and worsening headache.  She remained persistently febrile and encephalopathic.  Abx were broadened to include vanc and zosyn.  The patient's fever did improve, but the patient's encephalopathy continue to worsen.  In addition, the patient was unable to tolerate dialysis secondary to hypotension.  The patient remained somnolent and was unsafe for any p.o. intake. MRI of the brain showed increased bilateral mastoid effusions with a few scattered subcentimeter acute ischemic infarcts in the bilateral cerebral hemispheres  Goals of care discussions were held with the patient's daughter.  We discussed the  patient's overall poor prognosis in the setting of her frailty, advanced age, multiple comorbidities, and acute medical illness.  We discussed the patient's poor quality of life and continued downward trajectory from a medical standpoint.  We also discussed the patient's previously expressed desire to stop dialysis given her overall poor quality of life. Subsequently, the patient's daughter felt that was in the patient's best interest to transition the focus of care to comfort measures to allow for natural death and preserve the patient's dignity.   Assessment/Plan: Nausea, vomiting, abdominal pain: No acute findings on CT. Amylase, lipase wnl.  - Some symptoms attributed to doxycycline, though, still remained after discontinuation.  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 IR follow up recommended for what could be culprit lesion (SMA) for chronic mesenteric ischemia.  - Repeat CT abd/pelvis today due to rigidity with exam is reassuring. - EGD 10/27 Dr. 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  - Continue PPI BID.  - Could restart metoclopramide 5mg  TID if symptoms persist per GI. - Limit narcotics (though these are chronic and cannot be stopped) -10/29 CT abd/pelvis--no acute findings -05/23/23 After goals of care discussion with the patient's daughter, it was felt in the patient's best interest to transition her focus of care to comfort measures to preserve the patient's DVT and to allow for natural death -As such no further workup will be undertaken   Left mastoiditis, mastoid effusion:  - initiated unasyn 10/28 - 10/29 CT abd/pelvis--no acute finding.  Sm bilat eff. Endometrial thickening - Pt has ENT referral

## 2023-05-23 NOTE — Addendum Note (Signed)
Addended by: Armstead Peaks on: 05/23/2023 12:29 PM   Modules accepted: Orders

## 2023-05-23 NOTE — Telephone Encounter (Signed)
Despina Hidden, CMA; Estell Harpin I got it!

## 2023-05-23 NOTE — Progress Notes (Signed)
Palliative:  HPI:  82 y.o. female  with past medical history of ESRD on HD MWF, HTN, combined systolic and diastolic CHF, chronic 2L oxygen, gastroparesis, gout admitted on 05/13/2023 with abd pain, nausea, vomiting, diarrhea thought related to doxycycline vs gastroparesis.    I met again today at Felicia Williams's bedside. She appears worse today. Daughter, Elita Quick, is at bedside. We discussed her mother's status with ongoing decline. She appears uncomfortable and is struggling to protect her airway. Felicia Williams and I had an honest conversation that Felicia Williams seems to be past the point of recovery. She is showing signs that she is at end of life. We reviewed the role of further testing/diagnostics (specifically LP) and Felicia Williams is clear she would not put her mother through LP. I explained that regardless of what we find from here I do not believe that Felicia Williams can survive this illness. I recommend transition to full comfort care as she is suffering. Felicia Williams understands and agrees that this would be reasonable. Felicia Williams is struggling with final decisions. She feels that her mother indicated that that she wanted to "fight." We spent time exploring what this could mean and the unfortunate fact that I do not believe her body has the resources to physically fight and recover even if she mentally wants too. I worry that we are prolonging her suffering. Felicia Williams needs some time to consider and discuss with her family. Felicia Williams brother is planning visit today so she is hoping to get his input. We agree that we will maintain antibiotics and conservative treatment while also providing medications for comfort so she does not suffer. Felicia Williams is considering full comfort care at this time.   Update: Full comfort care decided with follow up from Dr. Arbutus Leas.   Exam: Somnolent. Uncomfortable. Grimace, tense. Breathing regular but with episodes of choking sensation and cough with poor secretion management and poor airway protection. Ulcers noted in  mouth. Abd soft.   Plan: - DNR - Full comfort care - Anticipate hospital death  20 min  Yong Channel, NP Palliative Medicine Team Pager [phone] (Please see [email or web address] for schedule) Team Phone [phone]    Greater than 50%  of this time was spent counseling and coordinating care related to the above assessment and plan

## 2023-05-23 NOTE — TOC Progression Note (Signed)
Transition of Care [facility]) - Progression Note    Patient Details  Name: Felicia Williams MRN: [id number] Date of Birth: 11-28-1940  Transition of Care [facility]) CM/SW Contact  [place] Her[place], [place] Phone Number: 05/23/2023, 4:33 PM  Clinical Narrative:    CSW updated by MD that pt and family would like residential hospice set up with [facility]. CSW spoke to [facility] who states RN will come assess today. CSW received update that pt has been approved but there are no beds. MD to make pt GIP until bed is available at [facility]. TOC to follow.   Expected Discharge Plan: Home w Home Health Services Barriers to Discharge: Continued Medical Work up  Expected Discharge Plan and Services In-house Referral: Clinical Social Work Discharge Planning Services: CM Consult Post Acute Care Choice: Home Health Living arrangements for the past 2 months: Single Family Home Expected Discharge Date: 05/23/23                                     Social Determinants of Health (SDOH) Interventions SDOH Screenings   Food Insecurity: No Food Insecurity (05/14/2023)  Housing: Patient Declined (05/14/2023)  Transportation Needs: No Transportation Needs (05/14/2023)  Utilities: Not At Risk (05/14/2023)  Tobacco Use: Low Risk  (05/13/2023)    Readmission Risk Interventions    05/15/2023   12:00 PM 05/02/2023   12:37 PM 01/31/2023    9:54 AM  Readmission Risk Prevention Plan  Transportation Screening Complete Complete Complete  PCP or Specialist Appt within 3-5 Days  Complete   HRI or Home Care Consult Complete Complete Complete  Social Work Consult for Recovery Care Planning/Counseling Complete Complete Complete  Palliative Care Screening Not Applicable Not Applicable Not Applicable  Medication Review Oceanographer) Complete Referral to Pharmacy Complete

## 2023-05-24 DEATH — deceased

## 2023-05-26 LAB — CULTURE, BLOOD (ROUTINE X 2)
Culture: NO GROWTH
Culture: NO GROWTH
Special Requests: ADEQUATE

## 2023-06-23 NOTE — Progress Notes (Signed)
Brief Nephrology Note  Patient has transitioned to comfort care so we will sign off.

## 2023-06-23 NOTE — Death Summary Note (Signed)
DEATH SUMMARY   Patient Details  Name: Felicia Williams MRN: [id number] DOB: 1940-11-15 FAO:ZHYQMV, [place], NP Admission/Discharge Information   Admit Date:  05/25/23  Date of Death: Date of Death: 2023-05-26  Time of Death: Time of Death: 06-05-15  Length of Stay: 1   Principle Cause of death: sepsis  Hospital Diagnoses: Nausea, vomiting, abdominal pain: No acute findings on CT. Amylase, lipase wnl.  - Some symptoms attributed to doxycycline, though, still remained after discontinuation.  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 IR follow up recommended for what could be culprit lesion (SMA) for chronic mesenteric ischemia.  - Repeat CT abd/pelvis 10/29--no acute findings - EGD 10/27 Dr. 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  - Continued PPI BID.  - Could restart metoclopramide 5mg  TID if symptoms persist per GI. - Limit narcotics initially -10/29 CT abd/pelvis--no acute findings -2023/05/25 After goals of care discussion with the patient's daughter, it was felt in the patient's best interest to transition her focus of care to comfort measures to preserve the patient's dignity and to allow for natural death -As such no further workup will be undertaken   Left mastoiditis, mastoid effusion:  - initiated unasyn 10/28 - 10/29 CT abd/pelvis--no acute finding.  Sm bilat eff. Endometrial thickening - Pt has ENT referral already in place to [facility], had NP visits 9/5 and 9/12 for impacted cerumen of right ear canal that is not present at this time.  - Exam difficult due to patient agitation, though palpable firmness tracking down left neck prompted CT soft tissue which shows no parapharyngeal/deep space fluid collections.  -10/30--d/c Unasyn, continued vanc, started zosyn -May 25, 2023 After goals of care discussion with  the patient's daughter, it was felt in the patient's best interest to transition her focus of care to comfort measures to preserve the patient's dignity and to allow for natural death -As such no further workup will be undertaken   Fever, leukocytosis/lobar pneumonia/sepsis - initially started on unasyn, add vancomycin.  - Note exam remains difficult due to pt cooperation, but if meningismus developed, would need to consider lumbar puncture.  -10/29 CT neck--no neck mass/swelling; apical patchy consolidations -10/29 blood cultures neg -10/30--d/c Unasyn, continued vanc -LP deferred.  Daughter did not want any further invasive procedures -05/25/23 After goals of care discussion with the patient's daughter, it was felt in the patient's best interest to transition her focus of care to comfort measures to preserve the patient's dignity and to allow for natural death -As such no further workup will be undertaken   Acute ischemic stroke -10/30 MR brain--scattered subcentimeter ischemic infarcts bilateral cerebral hemispheres -05/25/23 After goals of care discussion with the patient's daughter, it was felt in the patient's best interest to transition her focus of care to comfort measures to preserve the patient's dignity and to allow for natural death -As such no further workup will be undertaken   Choking episode:  - Would keep her NPO while still drowsy (had ativan previously and now anesthesia care during EGD). SLP evaluation requested, pt is more alert, dysphagia diet ordered.   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 LVEF showed global hypokinesis with LVEF 35-40%, G1DD  - Continue monitoring clinically with serial dialysis hoping  to take fluid off.  -Repeat thoracentesis this admission 10/25 consistent with transudate,  negative gram stain--culture neg and cytology neg -This did not have significant impact on patient's subjective respiratory status nor oxygenation.  - Defer cardiology evaluation to outpatient setting as long as no anginal symptoms appear.  -chronically on 2L   ESRD:  - Nephrology following, continue HD per routine.  - pt not able to tolerate fluid removal due to intradialytic hypotension   Hyperkalemia: Resolved. Tx with dialysis.   Acute encephalopathy: ?Delirium vs.uremia. B12 replete, ammonia normal. Onset after ativan argues for metabolic insult, again received anesthesia care and is drowsy afterward.  - Continue monitoring closely. CT showed no acute intracranial abnormalities 10/28. - Prefer lowest possible dose of medications, e.g. ativan 1mg  caused long lasting sedation. - Palliative care is following.  -pt remained encephalopathic and gradually worsened -05/21/2023 After goals of care discussion with the patient's daughter, it was felt in the patient's best interest to transition her focus of care to comfort measures to preserve the patient's DVT and to allow for natural death -As such no further workup will be undertaken   HTN:  - off coreg, hydralazine.  - BP remains controlled   Left sacral stage 2 pressure injury: POA.  - Offload, optimize nutrition.    History of infected AVG: s/p excision, placement of artegraft by Dr. Karin Lieu.  - Completed doxycycline 10/23. Staples in place though appears to be healing well. If still admitted 10/31, would let [facility] know as she has follow up that day, may remove staples at that time.    DNR: Status confirmed with palliative consultation with patient on 10/25. Gold form in chart.    Weakness: This continues to be significant.  - Will continue getting up as much as tolerated, PT/OT efforts appreciated. Family concerned at her current level of functioning, may need rehabilitation prior to returning home.   Goals of care -Advance care  planning, including the explanation and discussion of advance directives was carried out with the patient and family.  Code status including explanations of "Full Code" and "DNR" and alternatives were discussed in detail.  Discussion of end-of-life issues including but not limited palliative care, hospice care and the concept of hospice, other end-of-life care options, power of attorney for health care decisions, living wills, and physician orders for life-sustaining treatment were also discussed with the patient and family.  Total face to face time 26 minutes. -05/06/2023 After goals of care discussion with the patient's daughter, it was felt in the patient's best interest to transition her focus of care to comfort measures to preserve the patient's DVT and to allow for natural death -As such no further workup will be undertaken  Hospital Course: 82 y.o. 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    CT abdomen pelvis without contrast showed small bilateral  pleural effusions with lower lobe consolidation when compared with the prior exam. Protonix was given, Reglan was given.  Lokelma due to hyperkalemia was also given. Hospitalist was asked to admit patient for further evaluation and management.   She was admitted, doxycycline course has been completed, gastroparesis treated with reglan though GI symptoms continue. GI consult requested. Pleural effusion was drained 10/25 without significant change in respiratory status.    EGD 10/27 showed gastritis and mild  schatzki's ring which was dilated.    CT head showed no acute intracranial abnormalities, but did show left mastoid effusion. This correlates to tenderness on exam and patient confirms pain in her ears, developed bloody  discharged from left ear on 10/28. Unasyn was started.    She developed a fever, bleeding from the nose, and worsening headache.  She remained persistently febrile and encephalopathic.  Abx were broadened to include vanc and zosyn.  The patient's fever did improve, but the patient's encephalopathy continue to worsen.  In addition, the patient was unable to tolerate dialysis secondary to hypotension.  The patient remained somnolent and was unsafe for any p.o. intake. MRI of the brain showed increased bilateral mastoid effusions with a few scattered subcentimeter acute ischemic infarcts in the bilateral cerebral hemispheres   Goals of care discussions were held with the patient's daughter.  We discussed the patient's overall poor prognosis in the setting of her frailty, advanced age, multiple comorbidities, and acute medical illness.  We discussed the patient's poor quality of life and continued downward trajectory from a medical standpoint.  We also discussed the patient's previously expressed desire to stop dialysis given her overall poor quality of life. Subsequently, the patient's daughter felt that was in the patient's best interest to transition the focus of care to comfort measures to allow for natural death and preserve the patient's dignity.   TOC was consulted who ultimately consulted [facility] hospice.  They assisted in transitioning patient to residential hospice.  A bed was not yet available.  As result, the patient was transition to general inpatient hospice care. The patient was placed on end of life protocol with opioids, benzodiazepines, robinul which were administered appropriated.  The patient did not experience any pain, discomfort, or distress.  She expired in peace on 06/17/2023 at 1716.      Procedures: none  Consultations: none  The results of significant diagnostics from this hospitalization (including imaging, microbiology, ancillary and laboratory) are listed below for  reference.   Significant Diagnostic Studies: MR BRAIN WO CONTRAST  Result Date: 04/23/2023 CLINICAL DATA:  Initial evaluation for mental status change. EXAM: MRI HEAD WITHOUT CONTRAST TECHNIQUE: Multiplanar, multiecho pulse sequences of the brain and surrounding structures were obtained without intravenous contrast. COMPARISON:  Prior CT from 05/20/2023 FINDINGS: Brain: Cerebral volume within normal limits for age. Patchy T2/FLAIR hyperintensity involving the periventricular and deep white matter both cerebral hemispheres, consistent with chronic small vessel ischemic disease, mild for age. Few small remote lacunar infarcts present about the left basal ganglia and left cerebellum. Few scattered subcentimeter foci of restricted diffusion are seen involving the bilateral cerebral hemispheres (series 5, images 23, 26, 27, 31, 37), consistent with small acute ischemic infarcts. No associated hemorrhage or mass effect. Gray-white matter differentiation otherwise maintained. No acute intracranial hemorrhage. Few scattered chronic micro hemorrhages noted, likely hypertensive in nature. No mass lesion, midline shift or mass effect. No hydrocephalus or extra-axial fluid collection. Pituitary gland and suprasellar region within normal limits. Vascular: Major intracranial vascular flow voids are maintained. Skull and upper cervical spine: Craniocervical junction within normal limits. Bone marrow signal intensity normal. No scalp soft tissue abnormality. Sinuses/Orbits: Prior bilateral ocular lens replacement. Chronic left sphenoid sinusitis noted. Paranasal sinuses are otherwise largely clear. Moderate bilateral mastoid effusions, increased from prior. Other: None. IMPRESSION: 1. 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 A central thromboembolic etiology is likely given the various vascular distributions involved. 2. Underlying mild chronic  microvascular ischemic disease. 3. Moderate bilateral mastoid effusions, increased from prior. 4. Chronic left sphenoid sinusitis. Electronically Signed   By: Rise Mu M.D.   On: 04/28/2023 04:34   ECHOCARDIOGRAM LIMITED  Result Date: 05/22/2023    ECHOCARDIOGRAM LIMITED REPORT   Patient Name:   TYSHAWNA ALARID Date of Exam: 05/22/2023 Medical Rec #:  [id number]        Height:       59.0 in Accession #:    [id number]       Weight:       78.9 lb Date of Birth:  25-Mar-1941        BSA:          1.242 m Patient Age:    82 years         BP:           125/87 mmHg Patient Gender: F                HR:           84 bpm. [facility] Procedure: Limited Echo Indications:    Endocarditis I38  History:        Patient has prior history of Echocardiogram examinations, most                 recent 02/11/2023. Risk Factors:Hypertension and Dyslipidemia. Hx                 of ESRD, Chronic combined systolic and diastolic CHF (congestive                 heart failure), Elevated troponin, BNP and Pleural effusion.  Sonographer:    Celesta Gentile RCS Referring Phys: [phone] [place] IMPRESSIONS  1. Limited study.  2. Left ventricular ejection fraction, by estimation, is 50 to 55%. The left ventricle has low normal function. The left ventricle demonstrates global hypokinesis. There is mild concentric left ventricular hypertrophy.  3. Right ventricular systolic function is normal. The right ventricular size is normal. Tricuspid regurgitation signal is inadequate for assessing PA pressure.  4. Left atrial size was mildly dilated.  5. Catheter tip visualized in right atrium.  6. The mitral valve is degenerative. Mild mitral valve regurgitation.  7. The aortic valve is tricuspid. There is mild calcification of the aortic valve. Aortic valve regurgitation is trivial.  8. The inferior vena cava is normal in size with greater than 50% respiratory variability, suggesting right atrial pressure of 3 mmHg.  9. No  obvious valvular vegetations. Comparison(s): Prior images reviewed side by side. LVEF has improved, now 50-55% range. FINDINGS  Left Ventricle: Left ventricular ejection fraction, by estimation, is 50 to 55%. The left ventricle has low normal function. The left ventricle demonstrates global hypokinesis. The left ventricular internal cavity size was normal in size. There is mild concentric left ventricular hypertrophy. Right Ventricle: The right ventricular size is normal. No increase in right ventricular wall thickness. Right ventricular systolic function is normal. Tricuspid regurgitation signal is inadequate for assessing PA pressure. Left Atrium: Left atrial size was mildly dilated. Right Atrium: Catheter tip visualized in right atrium. Right atrial size was normal in size. Pericardium: There is no evidence of pericardial effusion. Mitral Valve: The mitral valve is degenerative in appearance. There is mild thickening of the mitral valve leaflet(s). There is mild calcification of the mitral valve leaflet(s). Mild mitral annular calcification. Mild mitral valve regurgitation. Tricuspid Valve: The tricuspid valve is grossly normal. Tricuspid valve regurgitation is  trivial. Aortic Valve: The aortic valve is tricuspid. There is mild calcification of the aortic valve. There is mild aortic valve annular calcification. Aortic valve regurgitation is trivial. Pulmonic Valve: The pulmonic valve was not well visualized. Pulmonic valve regurgitation is trivial. Aorta: The aortic root is normal in size and structure. Venous: The inferior vena cava is normal in size with greater than 50% respiratory variability, suggesting right atrial pressure of 3 mmHg. IAS/Shunts: No atrial level shunt detected by color flow Doppler. LEFT VENTRICLE PLAX 2D LVIDd:         4.90 cm LVIDs:         3.60 cm LV PW:         1.20 cm LV IVS:        1.10 cm LVOT diam:     1.90 cm LVOT Area:     2.84 cm  LEFT ATRIUM         Index LA diam:    3.70 cm  2.98 cm/m   AORTA Ao Root diam: 3.30 cm  SHUNTS Systemic Diam: 1.90 cm Nona Dell MD Electronically signed by Nona Dell MD Signature Date/Time: 05/22/2023/2:36:35 PM    Final    CT ABDOMEN PELVIS W CONTRAST  Result Date: 05/21/2023 CLINICAL DATA:  Persistent abdominal pain. EXAM: CT ABDOMEN AND PELVIS WITH CONTRAST TECHNIQUE: Multidetector CT imaging of the abdomen and pelvis was performed using the standard protocol following bolus administration of intravenous contrast. RADIATION DOSE REDUCTION: This exam was performed according to the departmental dose-optimization program which includes automated exposure control, adjustment of the mA and/or kV according to patient size and/or use of iterative reconstruction technique. CONTRAST:  OMNIPAQUE IOHEXOL 300 MG/ML  SOLN COMPARISON:  05/17/2023 FINDINGS: Lower Chest: No significant change in small bilateral pleural effusions and bilateral lower lobe atelectasis, left side greater than right. Hepatobiliary: No suspicious hepatic masses identified. Gallbladder contains some contrast but is otherwise unremarkable in appearance. No evidence of biliary obstruction. Pancreas: Stable 1.6 cm simple appearing cystic lesion in the pancreatic body, which may represent an indolent cystic neoplasm or pseudocyst. Spleen: Within normal limits in size and appearance. Adrenals/Urinary Tract: Stable bilateral renal parenchymal atrophy. No suspicious masses identified. No evidence of ureteral calculi or hydronephrosis. Urinary bladder is empty. Stomach/Bowel: No evidence of obstruction, inflammatory process or abnormal fluid collections. Vascular/Lymphatic: No pathologically enlarged lymph nodes. No acute vascular findings. Reproductive: Endometrial thickness measures 11 mm, which is abnormal for a postmenopausal female. No other pelvic masses identified. No evidence of inflammatory process or free fluid. Other:  None. Musculoskeletal:  No suspicious bone lesions  identified. IMPRESSION: No acute findings within the abdomen or pelvis. Endometrial thickness measures 11 mm, which is abnormal for a postmenopausal female. Recommend GYN consultation for consideration of endometrial sampling. Stable 1.6 cm simple appearing cystic lesion in pancreatic body, which may represent an indolent cystic neoplasm or pseudocyst. Recommend continued imaging follow-up in 2 years, preferably with MRI. This recommendation follows ACR consensus guidelines: Management of Incidental Pancreatic Cysts: A White Paper of the ACR Incidental Findings Committee. J Am Coll Radiol 2017;14:911-923. Stable small bilateral pleural effusions and bilateral lower lobe atelectasis, left side greater than right. Electronically Signed   By: Danae Orleans M.D.   On: 05/21/2023 15:07   CT SOFT TISSUE NECK W CONTRAST  Result Date: 05/21/2023 CLINICAL DATA:  Soft tissue infection suspected, neck, xray done. EXAM: CT NECK WITH CONTRAST TECHNIQUE: Multidetector CT imaging of the neck was performed using the standard protocol following the bolus  administration of intravenous contrast. RADIATION DOSE REDUCTION: This exam was performed according to the departmental dose-optimization program which includes automated exposure control, adjustment of the mA and/or kV according to patient size and/or use of iterative reconstruction technique. CONTRAST:  OMNIPAQUE IOHEXOL 300 MG/ML  SOLN COMPARISON:  None Available. FINDINGS: Pharynx and larynx: Normal. No mass or swelling. Salivary glands: No inflammation, mass, or stone. Thyroid: Unremarkable. Lymph nodes: No suspicious cervical lymphadenopathy. Vascular: Atherosclerotic calcifications of the carotid bulbs. Limited intracranial: Unremarkable. Visualized orbits: Unremarkable. Mastoids and visualized paranasal sinuses: Chronic left sphenoid sinusitis. Partial opacification of the bilateral mastoid air cells. Skeleton: Multilevel cervical spondylosis, worst at C4-5, where  there is at least mild spinal canal stenosis. No suspicious bone lesions. Upper chest: Patchy consolidation in the lung apices with associated small bilateral pleural effusions. Other: None. IMPRESSION: 1. No acute abnormality in the neck. 2. Patchy consolidation in the lung apices, suspicious for multifocal pneumonia, with associated small bilateral pleural effusions. 3. Chronic left sphenoid sinusitis. Electronically Signed   By: Orvan Falconer M.D.   On: 05/21/2023 13:20   CT HEAD WO CONTRAST ( )  Result Date: 05/20/2023 CLINICAL DATA:  Mental status change.  Unknown cause. EXAM: CT HEAD WITHOUT CONTRAST TECHNIQUE: Contiguous axial images were obtained from the base of the skull through the vertex without intravenous contrast. RADIATION DOSE REDUCTION: This exam was performed according to the departmental dose-optimization program which includes automated exposure control, adjustment of the mA and/or kV according to patient size and/or use of iterative reconstruction technique. COMPARISON:  CT head without contrast 02/02/2023. FINDINGS: Brain: No acute infarct, hemorrhage, or mass lesion is present. Mild atrophy and white matter changes are stable. Deep brain nuclei are within normal limits. The ventricles are of normal size. No significant extraaxial fluid collection is present. The brainstem and cerebellum are within normal limits. Midline structures are within normal limits. Vascular: Atherosclerotic calcifications are present within the cavernous internal carotid arteries bilaterally. Calcifications are present at the dural margin of both vertebral arteries. No focal asymmetric hyperdense vessel is present. Skull: Calvarium is intact. No focal lytic or blastic lesions are present. No significant extracranial soft tissue lesion is present. Sinuses/Orbits: Chronic opacification of the left sphenoid sinus is again noted. A left mastoid effusion is present. No obstructing nasopharyngeal lesion is present.  The paranasal sinuses and mastoid air cells are otherwise clear. Bilateral lens replacements are noted. Globes and orbits are otherwise unremarkable. IMPRESSION: 1. No acute intracranial abnormality or significant interval change. 2. Stable atrophy and white matter disease. This likely reflects the sequela of chronic microvascular ischemia. 3. Chronic left sphenoid sinus disease. 4. Left mastoid effusion. No obstructing nasopharyngeal lesion is present. Electronically Signed   By: Marin Roberts M.D.   On: 05/20/2023 12:13   CT Angio Abd/Pel w/ and/or w/o  Result Date: 05/18/2023 CLINICAL DATA:  Epigastric pain, weight loss, nausea and vomiting EXAM: CTA ABDOMEN AND PELVIS WITHOUT AND WITH CONTRAST TECHNIQUE: Multidetector CT imaging of the abdomen and pelvis was performed using the standard protocol during bolus administration of intravenous contrast. Multiplanar reconstructed images and MIPs were obtained and reviewed to evaluate the vascular anatomy. RADIATION DOSE REDUCTION: This exam was performed according to the departmental dose-optimization program which includes automated exposure control, adjustment of the mA and/or kV according to patient size and/or use of iterative reconstruction technique. CONTRAST:  80mL OMNIPAQUE IOHEXOL 350 MG/ML SOLN COMPARISON:  Prior CT scan of the abdomen and pelvis 05/13/2023; MRCP 12/14/2021 FINDINGS: VASCULAR Aorta: Normal caliber  aorta without aneurysm, dissection, vasculitis or significant stenosis. Extensive atherosclerotic vascular calcifications. 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 The right hepatic artery is replaced to the SMA. SMA: Focal calcified atherosclerotic plaque in the proximal SMA results in moderate to high-grade stenosis. The right hepatic artery is replaced to the SMA. Renals: There are 2 right-sided renal arteries and single left-sided renal  artery. All 3 renal arteries are small in caliber with extensive atherosclerotic plaque and likely significant stenoses. The native kidneys are atrophic. IMA: Patent without evidence of aneurysm, dissection, vasculitis or significant stenosis. Inflow: Patent without evidence of aneurysm, dissection, vasculitis or significant stenosis. Proximal Outflow: Bilateral common femoral and visualized portions of the superficial and profunda femoral arteries are patent without evidence of aneurysm, dissection, vasculitis or significant stenosis. Veins: No focal venous abnormality. Review of the MIP images confirms the above findings. NON-VASCULAR Lower chest: Mild cardiomegaly. Moderate left and small right layering pleural effusions. Dependent atelectasis in both lower lobes. Mild patchy consolidative airspace opacities in a peribronchovascular distribution in the right lower lobe suggests mild aspiration. Hepatobiliary: No focal liver abnormality is seen. No gallstones, gallbladder wall thickening, or biliary dilatation. Pancreas: Stable 1.7 cm circumscribed low-attenuation lesion at the junction of the pancreatic body and tail. No significant interval change compared to prior imaging from 12/14/2021. Confirmed is a simple cyst on prior MRI imaging. Spleen: Normal in size without focal abnormality. Adrenals/Urinary Tract: Normal adrenal glands. Atrophied native kidneys. Stable appearance of circumscribed water attenuation cystic lesions in the kidneys consistent with simple cysts. No imaging follow-up is recommended. Ureters and bladder are unremarkable. Stomach/Bowel: No focal bowel wall thickening or evidence of obstruction. Lymphatic: No suspicious lymphadenopathy. Reproductive: Low-attenuation fluid within the endometrial canal in the uterine fundus as previously seen. No adnexal mass.Stable ovoid soft tissue density present at the posterolateral margin of the vulva measures approximately 2.9 x 1.9 cm. This is  essentially unchanged dating back to August of 2023. This may represent a complex proteinaceous Bartholin's gland cyst. Given 1 year of stability, it is unlikely to be of clinical significance. Other: Severe pelvic floor laxity. Musculoskeletal: No acute fracture or aggressive appearing lytic or blastic osseous lesion. IMPRESSION: VASCULAR 1. 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 2. 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 3. Extensive atherosclerotic calcifications throughout the abdominal aorta without aneurysm or dissection. 4. Heavily diseased and atrophied bilateral renal arteries. 5. Mild cardiomegaly. NON-VASCULAR 1. No acute abnormality within the abdomen or pelvis. 2. Patchy airspace opacities in the right lower lobe are favored to reflect small volume aspiration changes. 3. Small right and moderate left layering pleural effusions. 4. Advanced pelvic floor laxity. 5. Numerous chronic findings as enumerated above without significant interval change compared to prior imaging. Aortic Atherosclerosis (ICD10-I70.0). Signed, Sterling Big, MD, RPVI [facility] Radiology Electronically Signed   By: Malachy Moan M.D.   On: 05/18/2023 06:32   DG Chest 1 View  Result Date: 05/17/2023 CLINICAL DATA:  Status post right thoracentesis. EXAM: CHEST  1 VIEW COMPARISON:  Chest x-ray dated May 13, 2023. FINDINGS: Unchanged tunneled right internal jugular dialysis catheter. Stable cardiomegaly and mild diffuse interstitial thickening. Resolved right pleural effusion after thoracentesis. No pneumothorax. Improved aeration of the right lung base. Unchanged small left pleural effusion and left basilar atelectasis. No acute osseous  abnormality. IMPRESSION: 1. Resolved right pleural effusion after thoracentesis. No  pneumothorax. 2. Unchanged small left pleural effusion and left basilar atelectasis. Electronically Signed   By: Obie Dredge M.D.   On: 05/17/2023 12:32   US THORACENTESIS ASP PLEURAL SPACE W/IMG GUIDE  Result Date: 05/17/2023 INDICATION: Pleural effusion.  Previous left thoracentesis 2 weeks ago. EXAM: ULTRASOUND GUIDED DIAGNOSTIC AND THERAPEUTIC RIGHT THORACENTESIS MEDICATIONS: 10 mL 1% lidocaine. COMPLICATIONS: None immediate. PROCEDURE: An ultrasound guided thoracentesis was thoroughly discussed with the patient and questions answered. The benefits, risks, alternatives and complications were also discussed. The patient understands and wishes to proceed with the procedure. Written consent was obtained. Ultrasound was performed to localize and mark an adequate pocket of fluid in the right chest. The area was then prepped and draped in the normal sterile fashion. 1% Lidocaine was used for local anesthesia. Under ultrasound guidance a 6 Fr Safe-T-Centesis catheter was introduced. Thoracentesis was performed. The catheter was removed and a dressing applied. FINDINGS: A total of approximately 750 mL of clear yellow fluid was removed. Samples were sent to the laboratory as requested by the clinical team. IMPRESSION: Successful ultrasound guided right thoracentesis yielding 750 mL of pleural fluid. Electronically Signed   By: Obie Dredge M.D.   On: 05/17/2023 10:44   CT ABDOMEN PELVIS WO CONTRAST  Result Date: 05/13/2023 CLINICAL DATA:  Acute abdominal pain with nausea and vomiting for several days EXAM: CT ABDOMEN AND PELVIS WITHOUT CONTRAST TECHNIQUE: Multidetector CT imaging of the abdomen and pelvis was performed following the standard protocol without IV contrast. RADIATION DOSE REDUCTION: This exam was performed according to the departmental dose-optimization program which includes automated exposure control, adjustment of the mA and/or kV according to patient size and/or use of iterative  reconstruction technique. COMPARISON:  09/15/2022 FINDINGS: Lower chest: Moderate to large pleural effusions are noted bilaterally with associated lower lobe consolidation increased in the interval from the prior exam. Hepatobiliary: Liver is within normal limits. Gallbladder is well distended with a single dependent gallstone. Pancreas: Stable cystic lesion is noted within the pancreatic body unchanged from the recent exam. Follow-up as previously described. Spleen: Normal in size without focal abnormality. Adrenals/Urinary Tract: Adrenal glands are within normal limits. Kidneys are well visualized bilaterally with diffuse renal vascular calcifications. No definitive calculi are seen. No obstructive changes are noted. Bladder is decompressed. Stomach/Bowel: No obstructive or inflammatory changes of the colon are noted. The appendix is not well visualized consistent with prior surgical history. Small bowel and stomach are within normal limits. Vascular/Lymphatic: Aortic atherosclerosis. No enlarged abdominal or pelvic lymph nodes. Reproductive: Uterus and bilateral adnexa are unremarkable. Other: No abdominal wall hernia or abnormality. No abdominopelvic ascites. Musculoskeletal: Degenerative changes are noted without acute abnormality. IMPRESSION: Increased bilateral pleural effusions with lower lobe consolidation when compare with the prior exam. Chronic changes as described. Electronically Signed   By: Alcide Clever M.D.   On: 05/13/2023 21:35   DG Chest 2 View  Result Date: 05/13/2023 CLINICAL DATA:  Chest pain. EXAM: CHEST - 2 VIEW COMPARISON:  05/01/2023. FINDINGS: Mild-to-moderate pulmonary vascular congestion noted. There are bilateral small-to-moderate pleural effusions, increased since the prior study. Bilateral lung fields are otherwise clear. Evaluation of cardiomediastinal silhouette is nondiagnostic due to bilateral lower hemithorax opacification. No acute osseous abnormalities. The soft tissues  are within normal limits. Right IJ hemodialysis catheter noted with its tip overlying the cavoatrial junction region. IMPRESSION: *Mild-to-moderate pulmonary vascular congestion. Small to moderate bilateral pleural effusions, increased since the prior study. Electronically  Signed   By: Jules Schick M.D.   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 Request made for diagnostic and therapeutic left thoracentesis. EXAM: ULTRASOUND GUIDED LEFT THORACENTESIS MEDICATIONS: 10 mL 1% lidocaine COMPLICATIONS: None immediate. PROCEDURE: An ultrasound guided thoracentesis was thoroughly discussed with the patient and questions answered. The benefits, risks, alternatives and complications were also discussed. The patient understands and wishes to proceed with the procedure. Written consent was obtained. Ultrasound was performed to localize and mark an adequate pocket of fluid within the left chest. The area was then prepped and draped in the normal sterile fashion. 1% Lidocaine was used for local anesthesia. Under ultrasound guidance a 6 Fr Safe-T-Centesis catheter was introduced. Thoracentesis was performed. The catheter was removed and a dressing applied. FINDINGS: A total of approximately 800 mL of yellow fluid was removed. Samples were sent to the laboratory as requested by the clinical team. IMPRESSION: Successful ultrasound guided left thoracentesis yielding 800 mL of pleural fluid. Performed by: Loyce Dys PA-C Electronically Signed   By: Simonne Come M.D.   On: 05/01/2023 12:35   DG Chest 1 View  Result Date: 05/01/2023 CLINICAL DATA:  Status post left thoracentesis EXAM: CHEST  1 VIEW COMPARISON:  04/30/2023 FINDINGS: Substantial reduction in size of the left pleural effusion. Continued obscuration of  the left heart border compatible with some residual pleural effusion. At least a small right pleural effusion noted. Interstitial accentuation in the lungs compatible with interstitial edema. Atherosclerotic calcification of the aortic arch. No pneumothorax. Bony demineralization noted. IMPRESSION: 1. Substantial reduction in size of the left pleural effusion, without pneumothorax. 2. Interstitial edema and at least a small right pleural effusion. 3. Bony demineralization. Electronically Signed   By: Gaylyn Rong M.D.   On: 05/01/2023 10:21   DG CHEST PORT 1 VIEW  Result Date: 04/30/2023 CLINICAL DATA:  Pleural effusion. EXAM: PORTABLE CHEST 1 VIEW COMPARISON:  Chest radiograph 02/10/2023 FINDINGS: The left heart border is partially obscured, however the cardiac silhouette again appears enlarged. Aortic atherosclerosis is noted. There is a moderate left pleural effusion which has substantially enlarged from the prior study with associated atelectasis or consolidation in the left lung base and mid lung. There is a small right pleural effusion which has decreased in size. The interstitial markings are less prominent than on the prior study without current evidence of pulmonary edema. No pneumothorax is identified. No acute osseous abnormality is seen. IMPRESSION: 1. Enlarged, moderate left pleural effusion with associated left mid and lower lung atelectasis or consolidation. 2. Decreased, small right pleural effusion. Electronically Signed   By: Sebastian Ache M.D.   On: 04/30/2023 13:35    Microbiology: Recent Results (from the past 240 hour(s))  Gram stain     Status: None   Collection Time: 05/17/23 10:17 AM   Specimen: Pleura  Result Value Ref Range Status   Specimen Description PLEURAL  Final   Special Requests NONE  Final   Gram Stain   Final    NO ORGANISMS SEEN PLEURAL CYTOSPIN SMEAR WBC PRESENT,BOTH PMN AND MONONUCLEAR Performed at [facility], [address] [place], [place]  [address]    Report Status 05/17/2023 FINAL  Final  Culture, body fluid w Gram Stain-bottle     Status: None   Collection Time: 05/17/23 10:18 AM  Specimen: Pleura  Result Value Ref Range Status   Specimen Description   Final    PLEURAL Performed at [facility], [address], [place], [place] [address]    Special Requests   Final    10CC Performed at [facility], [address] [place], [place] [address]    Culture   Final    NO GROWTH 6 DAYS Performed at [facility], [address], [place], [place] [address]    Report Status 05/18/2023 FINAL  Final  Culture, blood (Routine X 2) w Reflex to ID Panel     Status: None (Preliminary result)   Collection Time: 05/21/23  9:41 AM   Specimen: BLOOD RIGHT HAND  Result Value Ref Range Status   Specimen Description BLOOD RIGHT HAND  Final   Special Requests   Final    BOTTLES DRAWN AEROBIC ONLY Blood Culture results may not be optimal due to an inadequate volume of blood received in culture bottles   Culture   Final    NO GROWTH 3 DAYS Performed at [facility], [address] [place], [place] [address]    Report Status PENDING  Incomplete  Culture, blood (Routine X 2) w Reflex to ID Panel     Status: None (Preliminary result)   Collection Time: 05/21/23  9:41 AM   Specimen: BLOOD RIGHT FOREARM  Result Value Ref Range Status   Specimen Description BLOOD RIGHT FOREARM  Final   Special Requests   Final    BOTTLES DRAWN AEROBIC ONLY Blood Culture adequate volume   Culture   Final    NO GROWTH 3 DAYS Performed at [facility], [address] [place], [place] [address]    Report Status PENDING  Incomplete  MRSA Next Gen by PCR, Nasal     Status: None   Collection Time: 05/07/2023 12:06 PM   Specimen: Nasal Mucosa; Nasal Swab  Result Value Ref Range Status   MRSA by PCR Next Gen NOT DETECTED NOT DETECTED Final    Comment: (NOTE) The GeneXpert MRSA Assay (FDA approved for NASAL specimens only), is one component of a  comprehensive MRSA colonization surveillance program. It is not intended to diagnose MRSA infection nor to guide or monitor treatment for MRSA infections. Test performance is not FDA approved in patients less than 1 years old. Performed at [facility], [address], [place], [place] [address]       Signed: Catarina Hartshorn, MD 05/29/2023

## 2023-06-23 NOTE — TOC Transition Note (Addendum)
Transition of Care [facility]) - CM/SW Discharge Note   Patient Details  Name: Felicia Williams MRN: [id number] Date of Birth: April 05, 1941  [facility] CM/SW Contact:  Villa Herb, LCSWA Phone Number: 06/17/2023, 11:27 AM   Clinical Narrative:    CSW updated by Rae Halsted with hospice that a bed is open for pt at [facility]. Rae Halsted states that [facility] is unable to do the transport. CSW spoke with [facility], they are unable to assist. CSW spoke to [employer] who states they are able to pick pt up at 8 today. CSW confirmed transportation. CSW updated Keka with Hospice and updated RN with report number for [facility]. TOC signing off.   Addendum 11:30: CSW updated RN of numbers for report. MD states pt can be moved to residential hospice bed. CSW updated [facility] and they will arrive to pick pt up between 6-6:30. If pt is too low to move at arrival, they will not transport.   Final next level of care: Hospice Medical Facility     Patient Goals and CMS Choice      Discharge Placement                         Discharge Plan and Services Additional resources added to the After Visit Summary for                                       Social Determinants of Health (SDOH) Interventions SDOH Screenings   Food Insecurity: No Food Insecurity (05/14/2023)  Housing: Patient Declined (05/14/2023)  Transportation Needs: No Transportation Needs (05/14/2023)  Utilities: Not At Risk (05/14/2023)  Tobacco Use: Low Risk  (05/13/2023)     Readmission Risk Interventions    05/15/2023   12:00 PM 05/02/2023   12:37 PM 01/31/2023    9:54 AM  Readmission Risk Prevention Plan  Transportation Screening Complete Complete Complete  PCP or Specialist Appt within 3-5 Days  Complete   HRI or Home Care Consult Complete Complete Complete  Social Work Consult for Recovery Care Planning/Counseling Complete Complete Complete  Palliative Care Screening Not Applicable Not  Applicable Not Applicable  Medication Review [facility]) Complete Referral to Pharmacy Complete

## 2023-06-23 NOTE — Progress Notes (Addendum)
   06/16/2023 1050  Spiritual Encounters  Type of Visit Follow up  Care provided to: Pt not available  Reason for visit Routine spiritual support  OnCall Visit No   Chaplain attempted to visit with patient and family. However Felicia Williams and her family member were sleeping.   Valerie Roys [facility]  [phone]

## 2023-06-23 NOTE — Progress Notes (Signed)
Pt medicated for pain and secretions due to facial grimacing and groaning, along with upper airway gurgling. Pt with bleeding noted from nose and both eyes, along with dried bloody mucous drainage from mouth. Oral care provided.   Discussed stopping use of oral/pharyngeal suctioning with family, since continued suctioning is not removing/resolving the upper airway gurgling but is causing oral trauma and discomfort to patient. Daughter states understanding.  Cheyne stokes respirations noted, 18/min with 5 -10 second periods of apnea. Apical HR 110/min. Faint cyanosis noted to lips and forehead, toes and fingers. Pt turned for comfort.  Patient's daughter states that she does feels that moving her mother via EMS to residential hospice facility would be too traumatic for pt. I told her that given her mother's current status, I agreed whole-heartedly that pt should stay here. Family asked for potential time of death. Advised that given current status, probably within 24 - 48 hours at most. Family states no oral intake of liquids for more than 3 days and no food for 8 days. Reviewed expected stages and s/s impending death with family, they state understanding. Advised to call for needs.

## 2023-06-23 NOTE — Progress Notes (Signed)
1714: Pt's daughter to nurse's station and states, "I think my mama's passed on." To room to find pt gray in color with agonal gasp, unresponsive to touch. Unable to auscultate apical heart beat, no palpable carotid pulse. Pt had one further gasp then no respiration. Pt pronounced deceased at 06/23/1715 after second verification by attending nurse.  Daughter and visitors at bedside aware of pt's passing. Emotional support given.  MD Tat notified of pt's death. [facility] notified of pt's death as well, not a candidate for donation.  Family remains at bedside. States would like [employer] home.  Advised daughter to let staff know if she has any needs, states understanding.

## 2023-06-23 NOTE — Plan of Care (Signed)
  Problem: Safety: Goal: Ability to remain free from injury will improve Outcome: Progressing   Problem: Coping: Goal: Ability to identify and develop effective coping behavior will improve Outcome: Progressing   Problem: Role Relationship: Goal: Family's ability to cope with current situation will improve Outcome: Progressing Goal: Ability to verbalize concerns, feelings, and thoughts to partner or family member will improve Outcome: Progressing   Problem: Pain Management: Goal: Satisfaction with pain management regimen will improve Outcome: Progressing

## 2023-06-23 DEATH — deceased
